# Patient Record
Sex: Male | Born: 1940 | Race: White | Hispanic: No | Marital: Married | Smoking: Former smoker
Health system: Southern US, Community
[De-identification: ages and names within clinical notes are randomized; demographics above are authoritative.]

## PROBLEM LIST (undated history)

## (undated) DIAGNOSIS — I6529 Occlusion and stenosis of unspecified carotid artery: Secondary | ICD-10-CM

## (undated) DIAGNOSIS — I4891 Unspecified atrial fibrillation: Secondary | ICD-10-CM

## (undated) DIAGNOSIS — I5042 Chronic combined systolic (congestive) and diastolic (congestive) heart failure: Secondary | ICD-10-CM

## (undated) DIAGNOSIS — K56609 Unspecified intestinal obstruction, unspecified as to partial versus complete obstruction: Secondary | ICD-10-CM

## (undated) DIAGNOSIS — E785 Hyperlipidemia, unspecified: Secondary | ICD-10-CM

## (undated) DIAGNOSIS — J189 Pneumonia, unspecified organism: Secondary | ICD-10-CM

## (undated) DIAGNOSIS — E039 Hypothyroidism, unspecified: Secondary | ICD-10-CM

## (undated) DIAGNOSIS — Z85118 Personal history of other malignant neoplasm of bronchus and lung: Secondary | ICD-10-CM

## (undated) DIAGNOSIS — I2510 Atherosclerotic heart disease of native coronary artery without angina pectoris: Secondary | ICD-10-CM

## (undated) DIAGNOSIS — D497 Neoplasm of unspecified behavior of endocrine glands and other parts of nervous system: Secondary | ICD-10-CM

## (undated) DIAGNOSIS — I9789 Other postprocedural complications and disorders of the circulatory system, not elsewhere classified: Secondary | ICD-10-CM

## (undated) DIAGNOSIS — I639 Cerebral infarction, unspecified: Secondary | ICD-10-CM

## (undated) DIAGNOSIS — I714 Abdominal aortic aneurysm, without rupture: Secondary | ICD-10-CM

## (undated) DIAGNOSIS — Z8501 Personal history of malignant neoplasm of esophagus: Secondary | ICD-10-CM

## (undated) DIAGNOSIS — I671 Cerebral aneurysm, nonruptured: Secondary | ICD-10-CM

## (undated) DIAGNOSIS — C801 Malignant (primary) neoplasm, unspecified: Secondary | ICD-10-CM

## (undated) HISTORY — DX: Atherosclerotic heart disease of native coronary artery without angina pectoris: I25.10

## (undated) HISTORY — PX: OTHER SURGICAL HISTORY: SHX169

## (undated) HISTORY — DX: Unspecified atrial fibrillation: I48.91

## (undated) HISTORY — DX: Hypothyroidism, unspecified: E03.9

## (undated) HISTORY — DX: Hyperlipidemia, unspecified: E78.5

## (undated) HISTORY — DX: Neoplasm of unspecified behavior of endocrine glands and other parts of nervous system: D49.7

## (undated) HISTORY — PX: CATARACT EXTRACTION W/ INTRAOCULAR LENS  IMPLANT, BILATERAL: SHX1307

## (undated) HISTORY — DX: Personal history of other malignant neoplasm of bronchus and lung: Z85.118

## (undated) HISTORY — DX: Occlusion and stenosis of unspecified carotid artery: I65.29

## (undated) HISTORY — DX: Other postprocedural complications and disorders of the circulatory system, not elsewhere classified: I97.89

## (undated) HISTORY — DX: Personal history of malignant neoplasm of esophagus: Z85.01

## (undated) HISTORY — PX: CHOLECYSTECTOMY: SHX55

## (undated) HISTORY — DX: Abdominal aortic aneurysm, without rupture: I71.4

## (undated) HISTORY — PX: BRAIN SURGERY: SHX531

## (undated) HISTORY — DX: Unspecified intestinal obstruction, unspecified as to partial versus complete obstruction: K56.609

## (undated) HISTORY — PX: TONSILLECTOMY: SUR1361

## (undated) HISTORY — DX: Cerebral aneurysm, nonruptured: I67.1

---

## 1993-12-10 DIAGNOSIS — I639 Cerebral infarction, unspecified: Secondary | ICD-10-CM

## 1993-12-10 HISTORY — DX: Cerebral infarction, unspecified: I63.9

## 2000-07-15 ENCOUNTER — Emergency Department (HOSPITAL_COMMUNITY): Admission: EM | Admit: 2000-07-15 | Discharge: 2000-07-15 | Payer: Self-pay | Admitting: Emergency Medicine

## 2000-07-15 ENCOUNTER — Encounter: Payer: Self-pay | Admitting: Emergency Medicine

## 2002-10-09 ENCOUNTER — Encounter: Payer: Self-pay | Admitting: Internal Medicine

## 2002-10-09 ENCOUNTER — Encounter: Admission: RE | Admit: 2002-10-09 | Discharge: 2002-10-09 | Payer: Self-pay | Admitting: Internal Medicine

## 2002-10-21 ENCOUNTER — Encounter: Admission: RE | Admit: 2002-10-21 | Discharge: 2002-10-21 | Payer: Self-pay | Admitting: Gastroenterology

## 2002-10-21 ENCOUNTER — Encounter: Payer: Self-pay | Admitting: Gastroenterology

## 2002-10-29 ENCOUNTER — Ambulatory Visit: Admission: RE | Admit: 2002-10-29 | Discharge: 2003-01-08 | Payer: Self-pay | Admitting: Radiation Oncology

## 2002-11-02 ENCOUNTER — Encounter: Payer: Self-pay | Admitting: Thoracic Surgery

## 2002-11-02 ENCOUNTER — Encounter: Admission: RE | Admit: 2002-11-02 | Discharge: 2002-11-02 | Payer: Self-pay | Admitting: Thoracic Surgery

## 2002-11-13 ENCOUNTER — Ambulatory Visit (HOSPITAL_COMMUNITY): Admission: RE | Admit: 2002-11-13 | Discharge: 2002-11-13 | Payer: Self-pay | Admitting: Hematology & Oncology

## 2002-11-13 ENCOUNTER — Encounter: Payer: Self-pay | Admitting: Hematology & Oncology

## 2002-12-07 ENCOUNTER — Encounter: Payer: Self-pay | Admitting: Hematology & Oncology

## 2002-12-07 ENCOUNTER — Encounter (HOSPITAL_COMMUNITY): Admission: RE | Admit: 2002-12-07 | Discharge: 2002-12-07 | Payer: Self-pay | Admitting: Radiation Oncology

## 2002-12-07 ENCOUNTER — Inpatient Hospital Stay (HOSPITAL_COMMUNITY): Admission: AD | Admit: 2002-12-07 | Discharge: 2002-12-11 | Payer: Self-pay | Admitting: Hematology & Oncology

## 2002-12-21 ENCOUNTER — Encounter: Payer: Self-pay | Admitting: Hematology & Oncology

## 2002-12-21 ENCOUNTER — Ambulatory Visit (HOSPITAL_COMMUNITY): Admission: RE | Admit: 2002-12-21 | Discharge: 2002-12-21 | Payer: Self-pay | Admitting: Hematology & Oncology

## 2003-01-13 ENCOUNTER — Encounter (HOSPITAL_COMMUNITY): Admission: RE | Admit: 2003-01-13 | Discharge: 2003-02-08 | Payer: Self-pay | Admitting: Hematology & Oncology

## 2003-01-22 ENCOUNTER — Encounter: Admission: RE | Admit: 2003-01-22 | Discharge: 2003-01-22 | Payer: Self-pay | Admitting: Thoracic Surgery

## 2003-01-22 ENCOUNTER — Encounter: Payer: Self-pay | Admitting: Thoracic Surgery

## 2003-02-10 ENCOUNTER — Inpatient Hospital Stay (HOSPITAL_COMMUNITY): Admission: RE | Admit: 2003-02-10 | Discharge: 2003-02-26 | Payer: Self-pay | Admitting: Thoracic Surgery

## 2003-02-10 ENCOUNTER — Encounter (INDEPENDENT_AMBULATORY_CARE_PROVIDER_SITE_OTHER): Payer: Self-pay | Admitting: *Deleted

## 2003-02-10 ENCOUNTER — Encounter: Payer: Self-pay | Admitting: Thoracic Surgery

## 2003-02-11 ENCOUNTER — Encounter: Payer: Self-pay | Admitting: Thoracic Surgery

## 2003-02-11 HISTORY — PX: OTHER SURGICAL HISTORY: SHX169

## 2003-02-12 ENCOUNTER — Encounter: Payer: Self-pay | Admitting: Thoracic Surgery

## 2003-02-13 ENCOUNTER — Encounter: Payer: Self-pay | Admitting: Thoracic Surgery

## 2003-02-14 ENCOUNTER — Encounter: Payer: Self-pay | Admitting: Thoracic Surgery

## 2003-02-15 ENCOUNTER — Encounter: Payer: Self-pay | Admitting: Thoracic Surgery

## 2003-02-16 ENCOUNTER — Encounter: Payer: Self-pay | Admitting: Thoracic Surgery

## 2003-02-17 ENCOUNTER — Encounter: Payer: Self-pay | Admitting: Thoracic Surgery

## 2003-02-18 ENCOUNTER — Encounter: Payer: Self-pay | Admitting: Thoracic Surgery

## 2003-02-19 ENCOUNTER — Encounter: Payer: Self-pay | Admitting: Thoracic Surgery

## 2003-02-20 ENCOUNTER — Encounter: Payer: Self-pay | Admitting: Thoracic Surgery

## 2003-02-21 ENCOUNTER — Encounter: Payer: Self-pay | Admitting: Thoracic Surgery

## 2003-02-22 ENCOUNTER — Encounter: Payer: Self-pay | Admitting: Thoracic Surgery

## 2003-02-23 ENCOUNTER — Encounter: Payer: Self-pay | Admitting: Thoracic Surgery

## 2003-02-26 ENCOUNTER — Encounter: Payer: Self-pay | Admitting: Thoracic Surgery

## 2003-03-04 ENCOUNTER — Encounter: Admission: RE | Admit: 2003-03-04 | Discharge: 2003-03-04 | Payer: Self-pay | Admitting: Thoracic Surgery

## 2003-03-04 ENCOUNTER — Encounter: Payer: Self-pay | Admitting: Thoracic Surgery

## 2003-04-02 ENCOUNTER — Encounter: Admission: RE | Admit: 2003-04-02 | Discharge: 2003-04-02 | Payer: Self-pay | Admitting: Thoracic Surgery

## 2003-04-02 ENCOUNTER — Encounter: Payer: Self-pay | Admitting: Thoracic Surgery

## 2003-04-28 ENCOUNTER — Encounter: Admission: RE | Admit: 2003-04-28 | Discharge: 2003-04-28 | Payer: Self-pay | Admitting: Hematology & Oncology

## 2003-04-28 ENCOUNTER — Encounter: Payer: Self-pay | Admitting: Hematology & Oncology

## 2003-05-07 ENCOUNTER — Encounter: Payer: Self-pay | Admitting: Thoracic Surgery

## 2003-05-07 ENCOUNTER — Encounter: Admission: RE | Admit: 2003-05-07 | Discharge: 2003-05-07 | Payer: Self-pay | Admitting: Thoracic Surgery

## 2003-05-28 ENCOUNTER — Encounter: Admission: RE | Admit: 2003-05-28 | Discharge: 2003-05-28 | Payer: Self-pay | Admitting: Thoracic Surgery

## 2003-05-28 ENCOUNTER — Encounter: Payer: Self-pay | Admitting: Thoracic Surgery

## 2003-06-11 ENCOUNTER — Encounter: Payer: Self-pay | Admitting: Thoracic Surgery

## 2003-06-11 ENCOUNTER — Encounter: Admission: RE | Admit: 2003-06-11 | Discharge: 2003-06-11 | Payer: Self-pay | Admitting: Thoracic Surgery

## 2003-07-13 ENCOUNTER — Encounter: Admission: RE | Admit: 2003-07-13 | Discharge: 2003-07-13 | Payer: Self-pay | Admitting: Hematology & Oncology

## 2003-07-13 ENCOUNTER — Encounter: Payer: Self-pay | Admitting: Hematology & Oncology

## 2003-08-11 ENCOUNTER — Encounter: Admission: RE | Admit: 2003-08-11 | Discharge: 2003-08-11 | Payer: Self-pay | Admitting: Thoracic Surgery

## 2003-08-11 ENCOUNTER — Encounter: Payer: Self-pay | Admitting: Thoracic Surgery

## 2003-09-22 ENCOUNTER — Encounter: Admission: RE | Admit: 2003-09-22 | Discharge: 2003-09-22 | Payer: Self-pay | Admitting: Hematology & Oncology

## 2003-09-22 ENCOUNTER — Encounter: Payer: Self-pay | Admitting: Hematology & Oncology

## 2003-11-10 ENCOUNTER — Encounter: Admission: RE | Admit: 2003-11-10 | Discharge: 2003-11-10 | Payer: Self-pay | Admitting: Thoracic Surgery

## 2003-11-25 ENCOUNTER — Ambulatory Visit (HOSPITAL_COMMUNITY): Admission: RE | Admit: 2003-11-25 | Discharge: 2003-11-25 | Payer: Self-pay | Admitting: Gastroenterology

## 2003-12-07 ENCOUNTER — Encounter: Admission: RE | Admit: 2003-12-07 | Discharge: 2003-12-07 | Payer: Self-pay | Admitting: Hematology & Oncology

## 2003-12-10 IMAGING — CT CT CHEST W/ CM
1 of 2 series · 14 of 32 positions shown, 18 images · IV contrast (GASTRO. & OMNIPAQUE [ID])
Comparison: none

FINDINGS
CLINICAL DATA: TOITS. FOLLOW UP RECURRENT ESOPHAGEAL CANCER.  STATUS POST SURGERY, RADIATION AND
CHEMO.   (CON: NONE)
COMPARISON TO 07/13/03.
MULTIDETECTOR HELICAL CT IMAGING IS PERFORMED THROUGH THE CHEST, ABDOMEN AND PELVIS FOLLOWING
DILUTE ORAL CONTRAST AND 100 CC OMNIPAQUE 300 IV.
CT OF CHEST WITH CONTRAST:
POSTOPERATIVE CHANGES FROM GASTRIC PULL-THROUGH SURGERY ARE AGAIN NOTED.  THE GASTRIC PULL-THROUGH
AND REMAINING ESOPHAGUS ARE AGAIN NOTED TO BE MILDLY DILATED.   NO REAL CHANGE SINCE THE PRIOR
STUDY.  OBSCURATIONS OF THE FAT PLANE AROUND THE POSTOPERATIVE SITE ARE AGAIN NOTED AND ARE
UNCHANGED, LIKELY POSTOPERATIVE SCARRING.
NO EVIDENCE OF MEDIASTINAL, AXILLARY OR HILAR ADENOPATHY.  SMALL TO MEDIUM SIZE LEFT PLEURAL
EFFUSION IS NOTED, UNCHANGED.
ON TODAY'S STUDY, THE CONTRAST WAS INJECTED WITHIN THE RIGHT ARM.  THIS SHOWS WHAT APPEARS TO BE
CHRONIC OCCLUSION OF THE RIGHT SUBCLAVIAN VEIN WITH NUMEROUS COLLATERAL VESSELS IN THE RIGHT CHEST,
AND THROUGH AN AZYGOS VENOUS SYSTEM.  THE PRIOR STUDY WAS INJECTED FROM THE LEFT ARM WHICH SHOWS A
PATENT LEFT SYSTEM AND SVC.
IMPRESSION
1.  STABLE POSTOPERATIVE APPEARANCE FOR GASTRIC PULL-THROUGH SURGERY.
2.  STABLE SMALL TO MEDIUM SIZE LEFT PLEURAL EFFUSION.
3.  PROBABLE CHRONIC OCCLUSION OF RIGHT SUBCLAVIAN VEIN WITH NUMEROUS COLLATERALS IN THE RIGHT
CHEST.
CT OF ABDOMEN WITH CONTRAST:
THE PATIENT IS STATUS POST CHOLECYSTECTOMY.  NO FOCAL LESIONS SEEN IN THE LIVER, SPLEEN, PANCREAS,
LEFT ADRENALS OR KIDNEYS EXCEPT FOR SMALL RENAL CYSTS.  THE RIGHT ADRENAL GLAND APPEARS MORE
PROMINENT ON TODAY'S STUDY WITH SOME NODULARITY WITHIN THE INFERIOR ASPECT OF THE RIGHT ADRENAL
GLAND.  A NODULE WITHIN THE RIGHT INFERIOR ADRENAL GLAND MEASURES 15 MM.  IT IS UNKNOWN IF THIS IS
TRULY A CHANGE SINCE PRIOR STUDY OR IF THIS IS SIMPLY BETTER DEFINED AND SEEN ON TODAY'S STUDY.
WHEN COMPARING TO THE PRIOR STUDY, I FAVOR THAT THIS DOES REPRESENT A CHANGE, AND CLOSE INTERVAL
FOLLOW UP OF THIS AREA IS RECOMMENDED.
AGAIN NOTED IS A SMALL ABDOMINAL AORTIC ANEURYSM MEASURING MAXIMALLY 3 CM, STABLE SINCE PRIOR
STUDY.  EXTENSIVE MURAL PLAQUE IS NOTED AND CALCIFICATIONS ARE NOTED.  NO ADENOPATHY.
THE WALL OF THE MID TRANSVERSE COLON APPEARS THICKENED ON TODAY'S STUDY.  THIS MAY SIMPLY BE
RELATED TO PERISTALTIC WAVE OR NON-DISTENTION, BUT I WOULD RECOMMEND ATTENTION TO THIS AREA ON
FOLLOW UP STUDIES AS WELL OR BARIUM ENEMA FOR FURTHER EVALUATION.
1.  INCREASING PROMINENCE OF THE RIGHT ADRENAL GLAND WITH MILD NODULARITY NOTED.  I WOULD RECOMMEND
CLOSE INTERVAL FOLLOW UP OF THIS FINDING.
2.  NEW THICKENING IN THE MID TRANSVERSE COLON, WHICH COULD BE RELATED TO PERISTALTIC WAVE OR NON-
DISTENTION, BUT ATTENTION TO THIS AREA ON SUBSEQUENT FILMING OR BARIUM ENEMA IS RECOMMENDED FOR
FURTHER EVALUATION.
3.  STATUS POST CHOLECYSTECTOMY.
4.  STABLE ABDOMINAL AORTIC ANEURYSM, 3 CM.
CT OF PELVIS WITH CONTRAST:
NO FREE FLUID, FREE AIR OR ADENOPATHY.  THE BOWEL IS GROSSLY UNREMARKABLE.  BONY STRUCTURES ARE
NORMAL.
NO ACUTE ABNORMALITY IN THE PELVIS.

[Series 2: chest/abd/pelvis · axial · 0.66mm/px · z∈[-608,-9]mm · 14 of 165 slices shown, 18 images]
[im 8/165  soft-tissue]
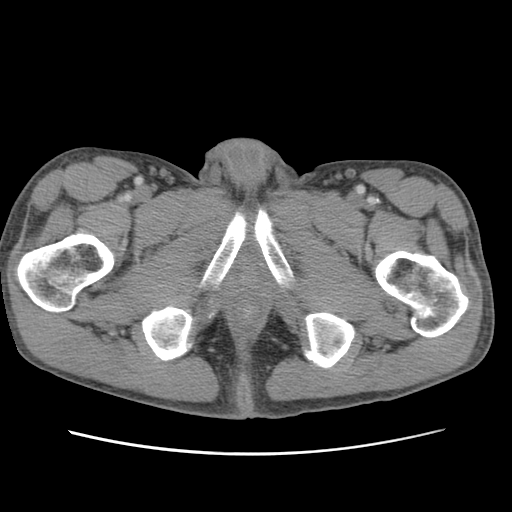
[im 8/165  bone]
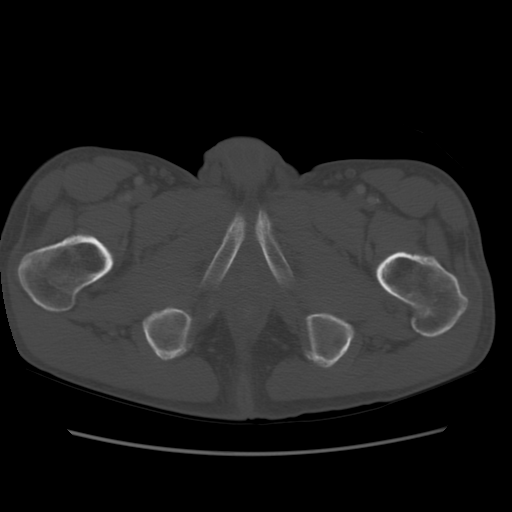
[im 23/165  soft-tissue]
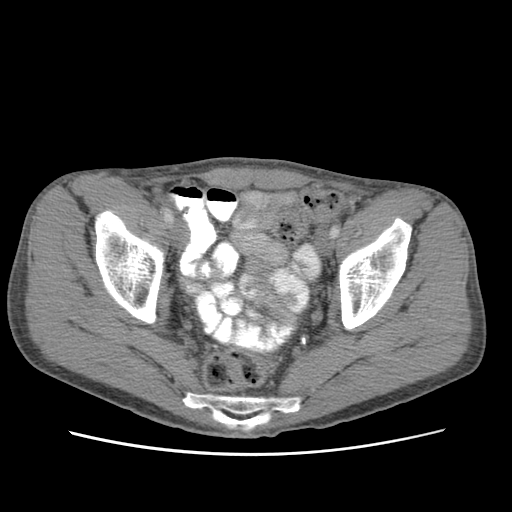
[im 38/165  soft-tissue]
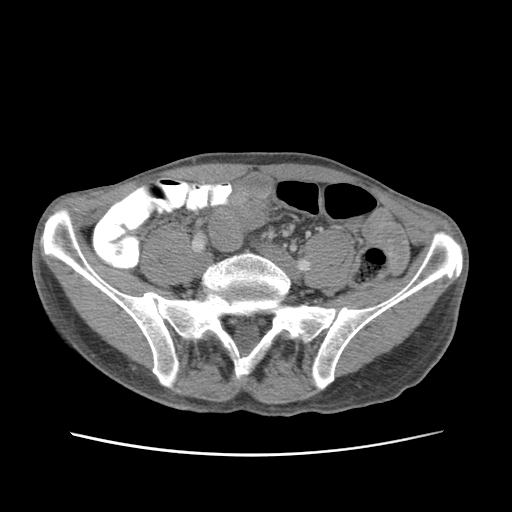
[im 53/165  soft-tissue]
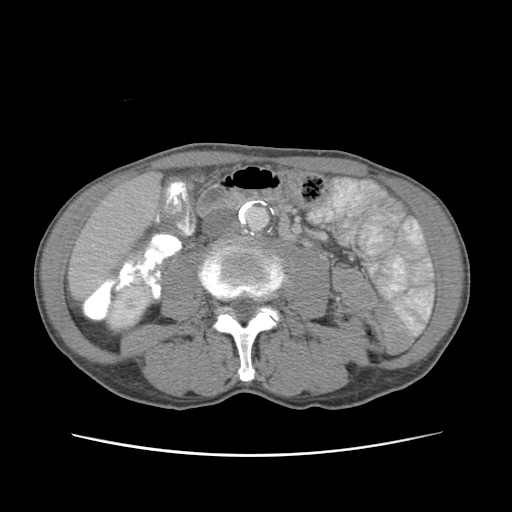
[im 60/165  soft-tissue]
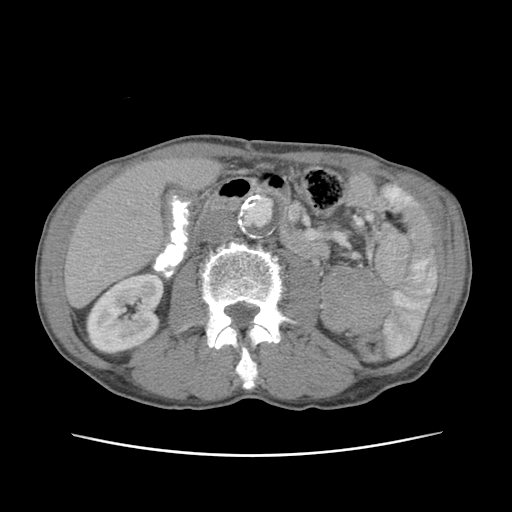
[im 75/165  soft-tissue]
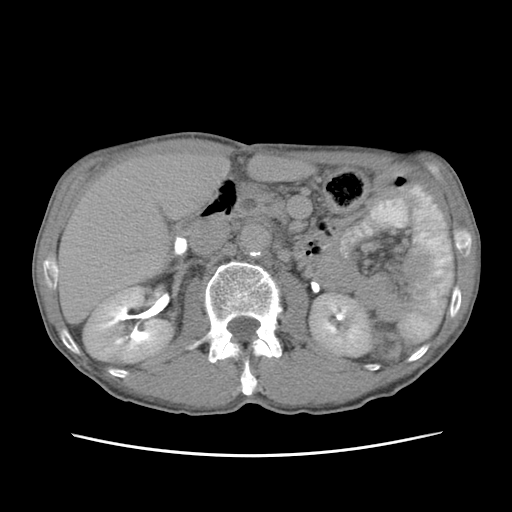
[im 90/165  soft-tissue]
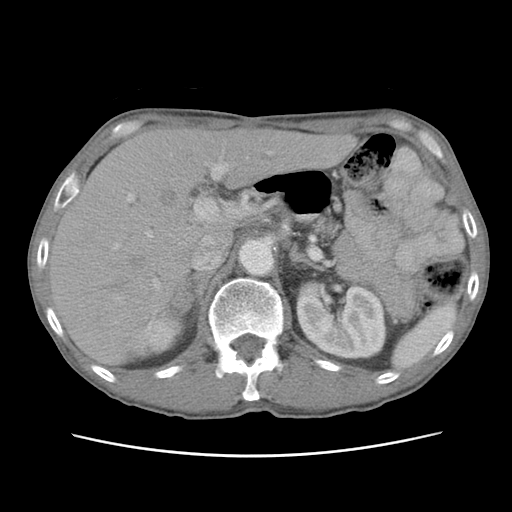
[im 105/165  soft-tissue]
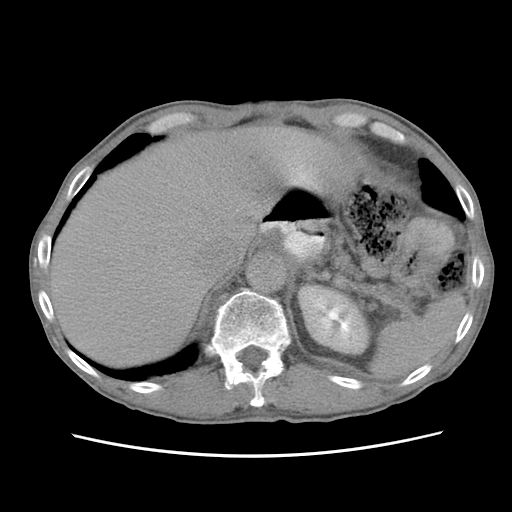
[im 112/165  soft-tissue]
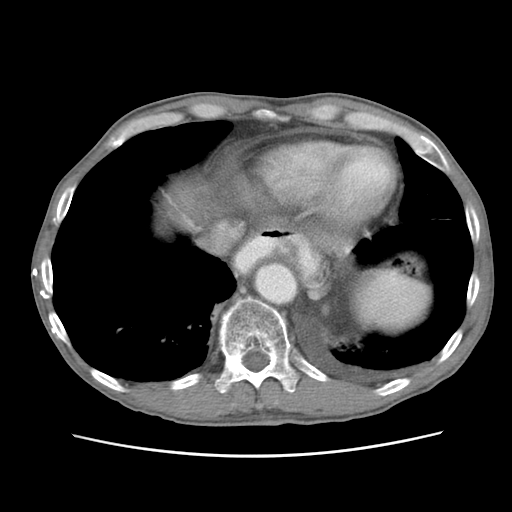
[im 112/165  bone]
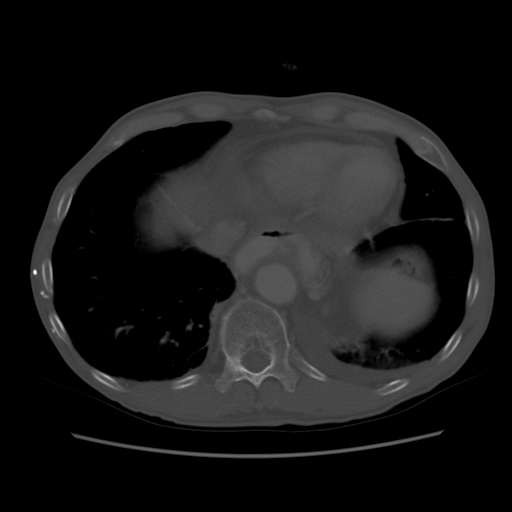
[im 127/165  soft-tissue]
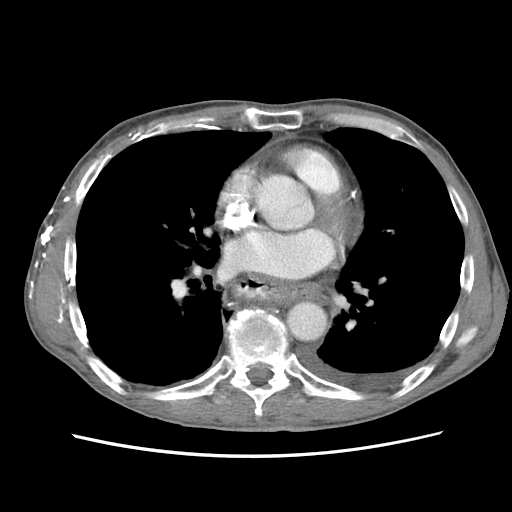
[im 135/165  lung]
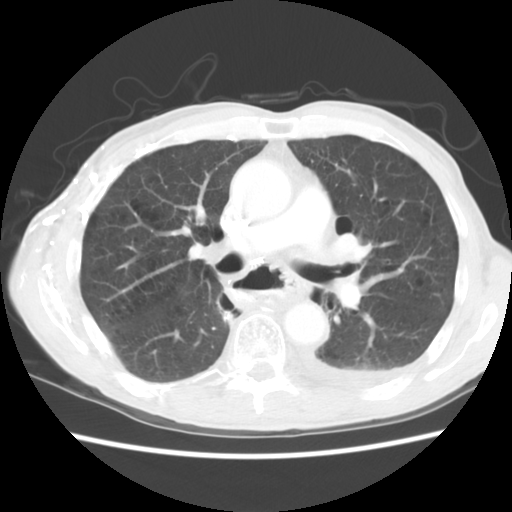
[im 142/165  soft-tissue]
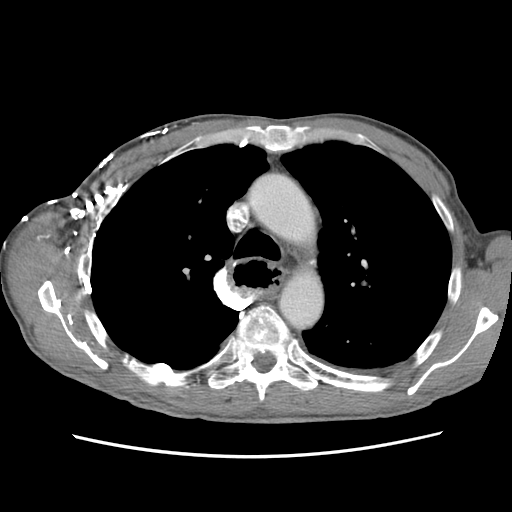
[im 142/165  lung]
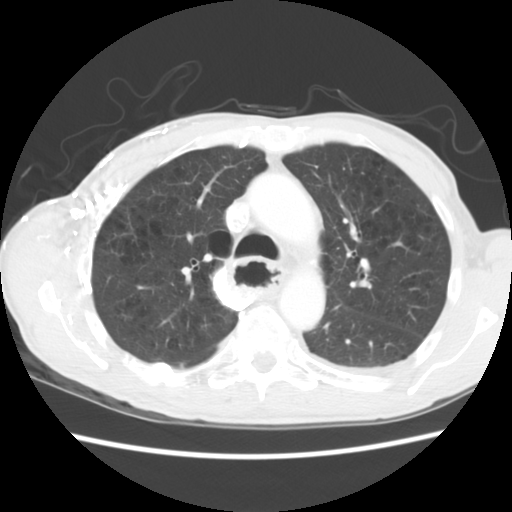
[im 150/165  lung]
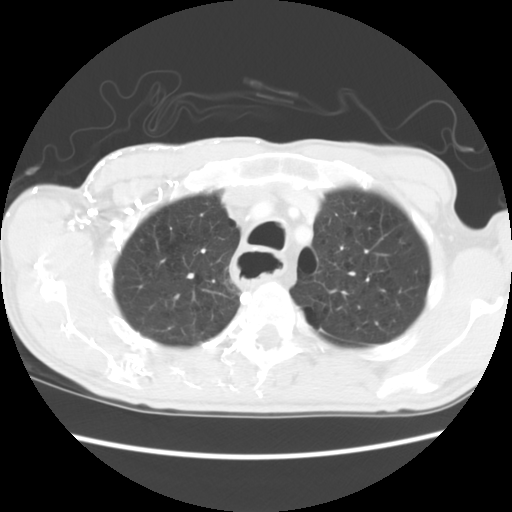
[im 157/165  soft-tissue]
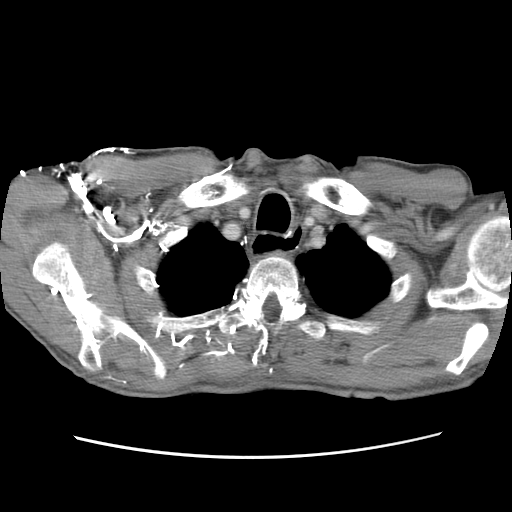
[im 157/165  lung]
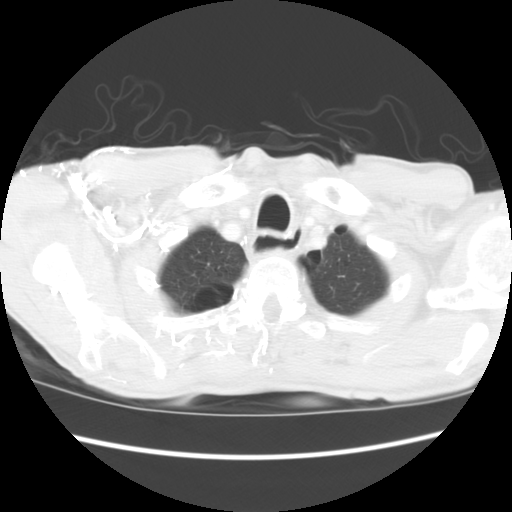

[14 of 32 positions shown; findings below may reference images not displayed]

## 2003-12-24 ENCOUNTER — Ambulatory Visit (HOSPITAL_COMMUNITY): Admission: RE | Admit: 2003-12-24 | Discharge: 2003-12-24 | Payer: Self-pay | Admitting: Hematology & Oncology

## 2004-01-21 ENCOUNTER — Encounter (INDEPENDENT_AMBULATORY_CARE_PROVIDER_SITE_OTHER): Payer: Self-pay | Admitting: *Deleted

## 2004-01-21 ENCOUNTER — Inpatient Hospital Stay (HOSPITAL_COMMUNITY): Admission: RE | Admit: 2004-01-21 | Discharge: 2004-01-25 | Payer: Self-pay | Admitting: Surgery

## 2004-01-26 ENCOUNTER — Inpatient Hospital Stay (HOSPITAL_COMMUNITY): Admission: EM | Admit: 2004-01-26 | Discharge: 2004-02-02 | Payer: Self-pay | Admitting: Emergency Medicine

## 2004-02-24 IMAGING — CT CT CHEST W/ CM
1 of 2 series · 14 of 32 positions shown, 18 images · IV contrast (gastro & omnipaque 100)
Comparison: none

CLINICAL DATA: Recurrent esophageal cancer.  He had surgery with a gastric pull-through in [REDACTED] and finished chemotherapy and radiation therapy in [REDACTED] of this year.  He is an ex-smoker.    CON ? none.
CHEST CT WITH CONTRAST 
Helical transaxial images of the chest, abdomen and pelvis were obtained with oral contrast and 100 cc of Omnipaque 300 intravenous contrast.  These are compared to the previous examinations dated 09/22/03.  Again demonstrated are changes of a gastric pull-through with no visible mass.  Extensive changes of COPD are again demonstrated throughout both lungs.  No lung nodules or enlarged lymph nodes are seen.  Again noted is occlusion of the right subclavian vein with associated collaterals.  An old, healed right sixth posterior rib fracture is unchanged.  Thoracic spine degenerative changes are also noted.  There has been an interval decrease in amount of left pleural fluid. 
IMPRESSION
1.  Status post esophagectomy and gastric pull-through without evidence of tumor recurrence for metastatic disease.
2.  Stable extensive changes of COPD.
3.  Stable occlusion of the right subclavian vein.  
4.  Minimal left pleural effusion with improvement.
ABDOMEN CT WITH CONTRAST 
There has been an interval increase in size of an inhomogeneously enhancing mass in the right adrenal gland.  This currently measures 4.4 x 2.8 cm in maximum dimensions.  Two small right renal cysts are unchanged. Cholecystectomy clips are again demonstrated.  The liver, spleen, pancreas, left kidney, and left adrenal gland are unremarkable.  No intestinal abnormalities or enlarged lymph nodes are seen.  There has been no significant change in minimal aneurysmal dilatation of the infrarenal abdominal aorta, measuring 3.1 x 2.8 cm in maximum dimensions.  Lumbar spine degenerative changes are noted. 
1.  Enlarging right adrenal metastasis, currently measuring 4.4 x 2.8 cm in maximum dimensions. 
2.  Stable small infrarenal abdominal aortic aneurysm, measuring 3.1 cm in maximum diameter.  
3.  Stable two small right renal cysts.  
4.  Status post cholecystectomy.
PELVIS CT WITH CONTRAST
The pelvic loops of colon and small bowel are unremarkable as are the urinary bladder and prostate gland.  No masses or enlarged lymph nodes are seen.  Unremarkable bones. 
No acute pelvic abnormality.

[Series 2: chest/abd/pelvis · axial · 0.70mm/px · z∈[-587,-20]mm · 14 of 154 slices shown, 18 images]
[im 7/154  soft-tissue]
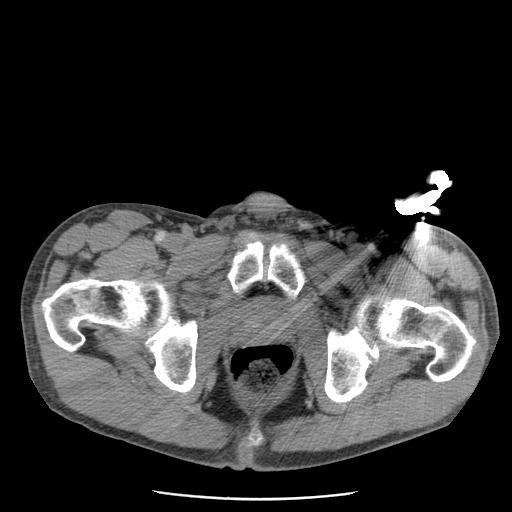
[im 7/154  bone]
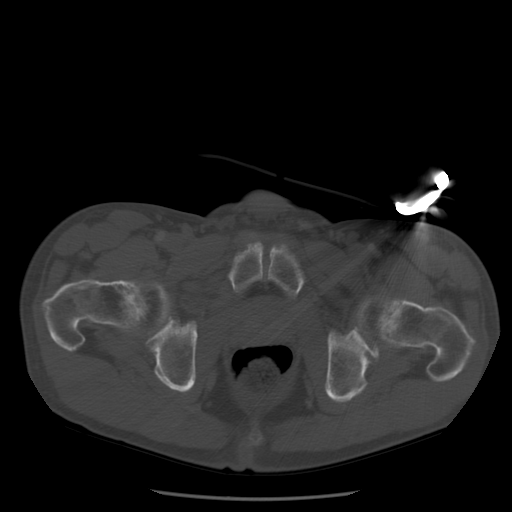
[im 21/154  soft-tissue]
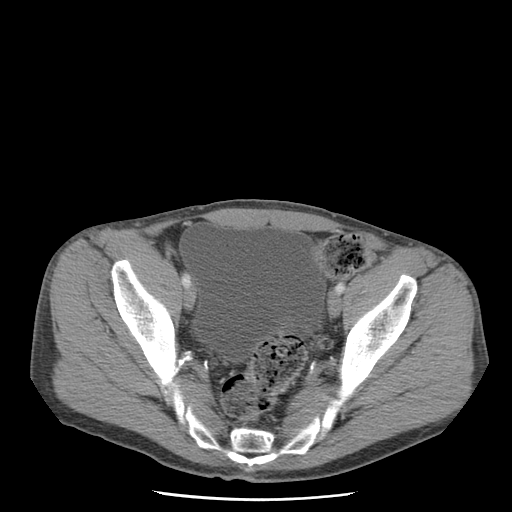
[im 35/154  soft-tissue]
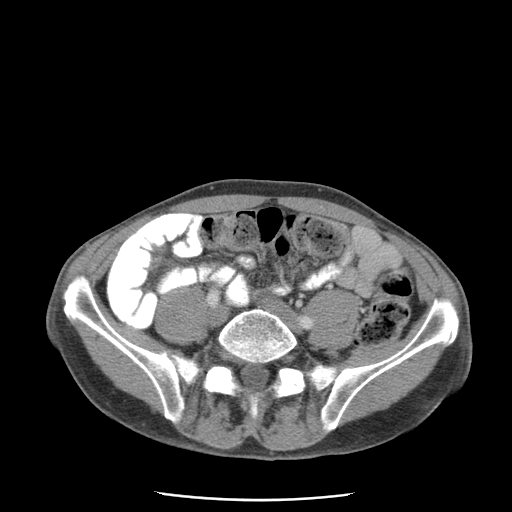
[im 49/154  soft-tissue]
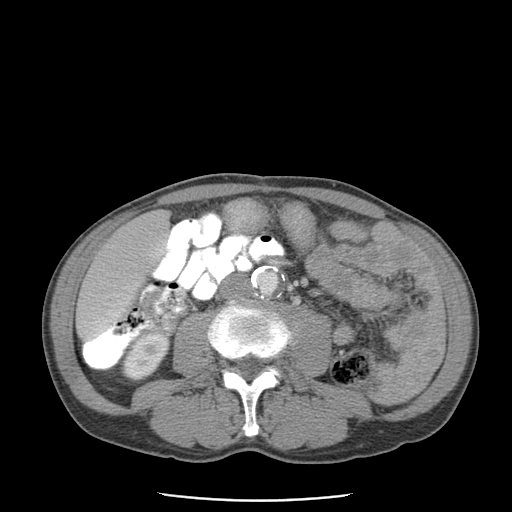
[im 56/154  soft-tissue]
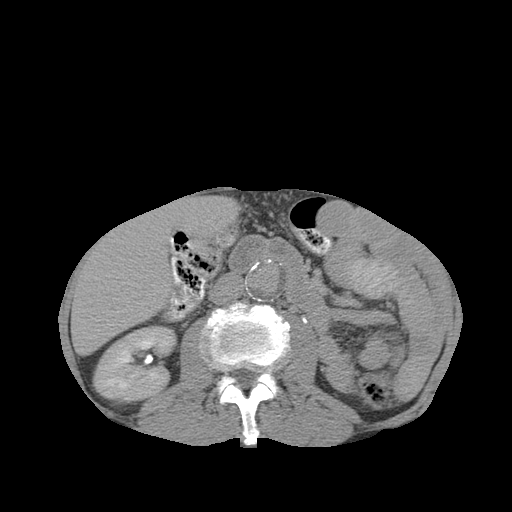
[im 70/154  soft-tissue]
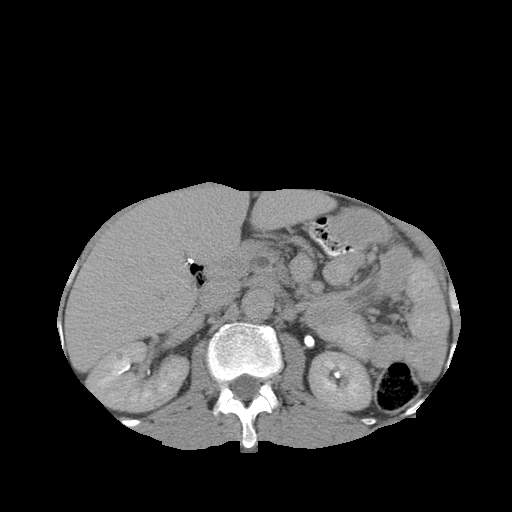
[im 84/154  soft-tissue]
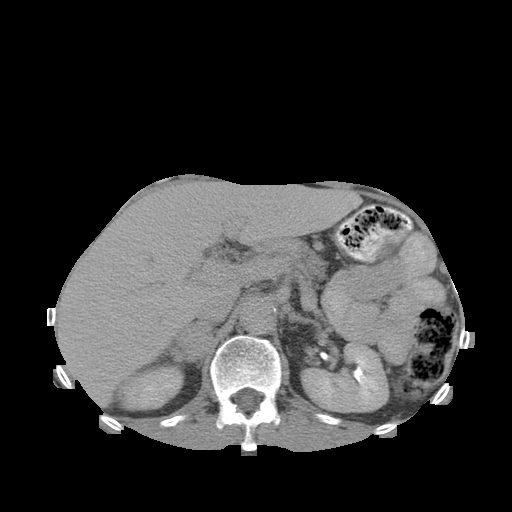
[im 98/154  soft-tissue]
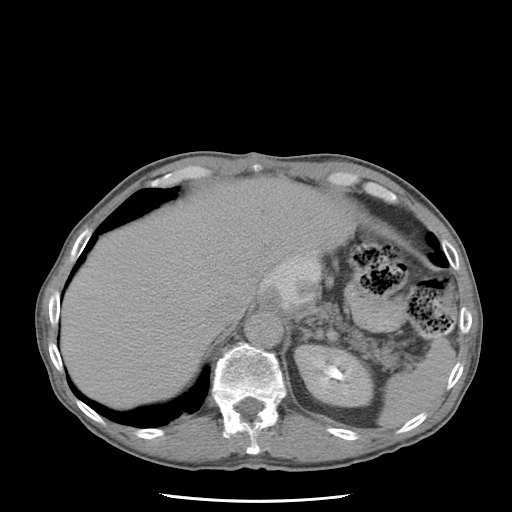
[im 105/154  soft-tissue]
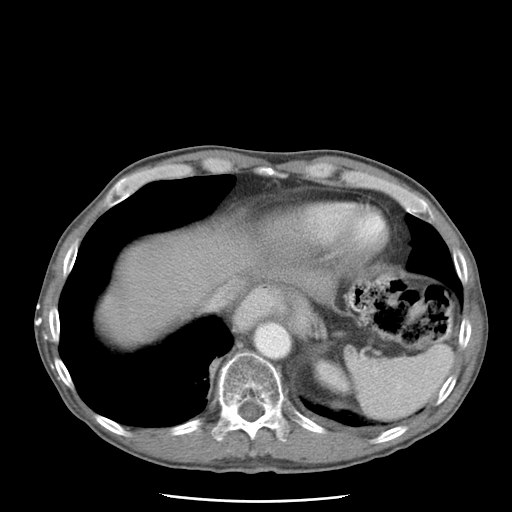
[im 105/154  bone]
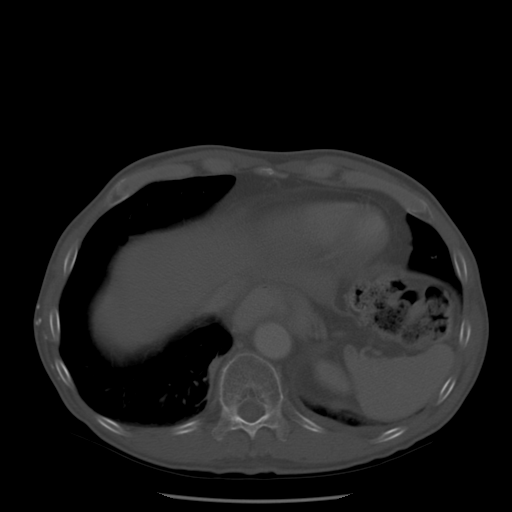
[im 119/154  soft-tissue]
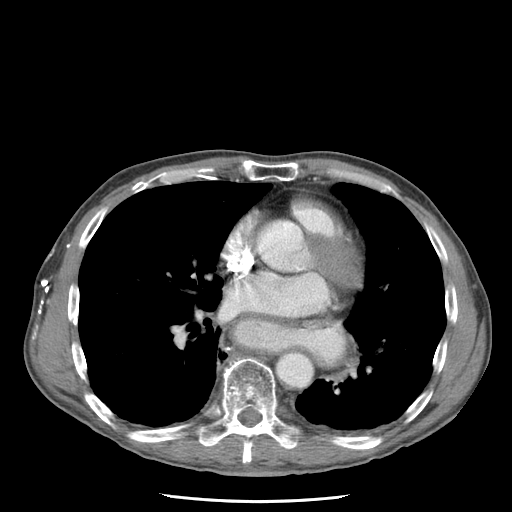
[im 126/154  lung]
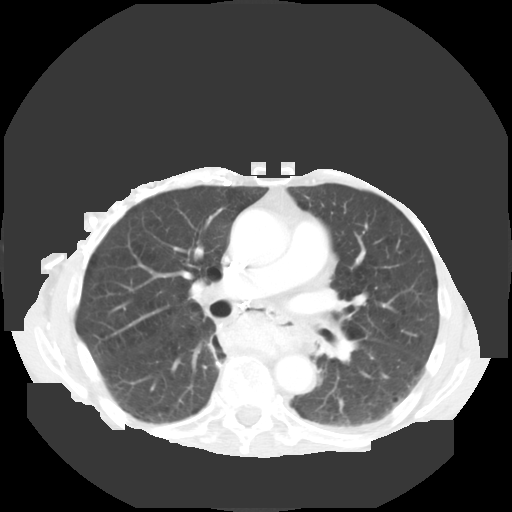
[im 133/154  soft-tissue]
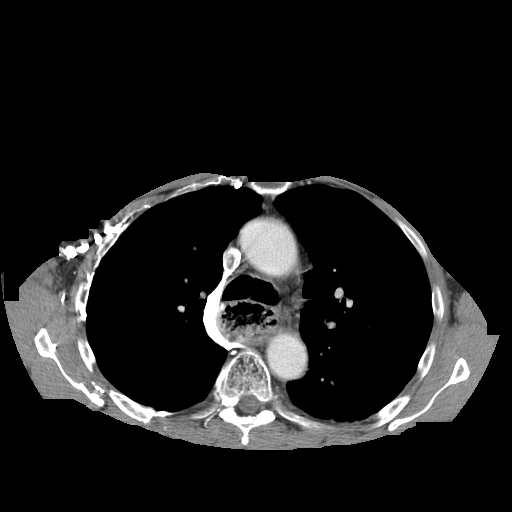
[im 133/154  lung]
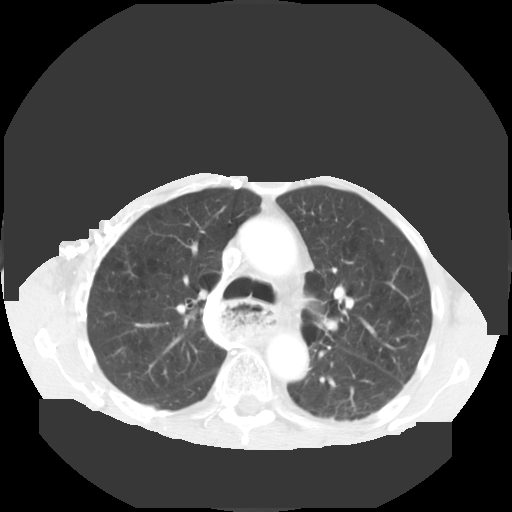
[im 140/154  lung]
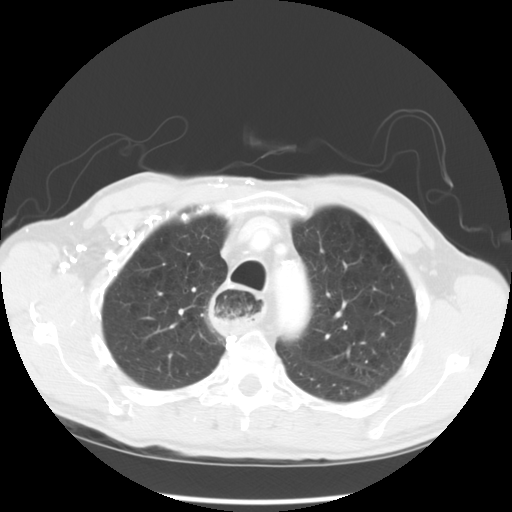
[im 147/154  soft-tissue]
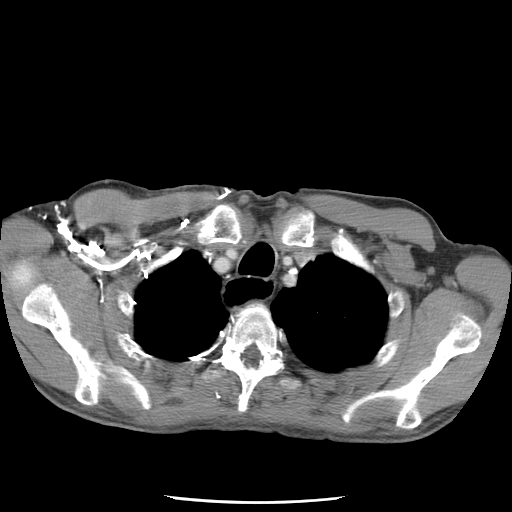
[im 147/154  lung]
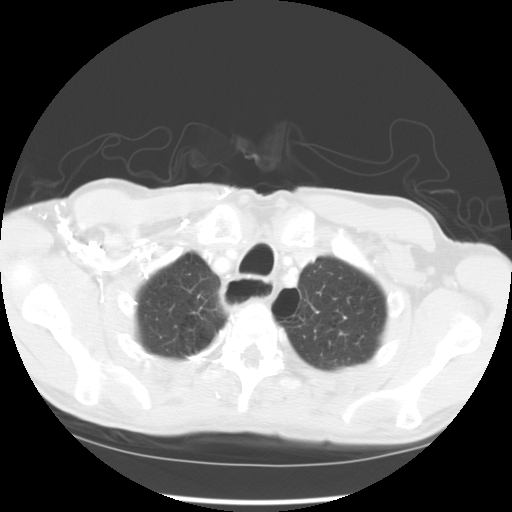

[14 of 32 positions shown; findings below may reference images not displayed]

## 2004-03-09 ENCOUNTER — Ambulatory Visit (HOSPITAL_COMMUNITY): Admission: RE | Admit: 2004-03-09 | Discharge: 2004-03-09 | Payer: Self-pay | Admitting: Surgery

## 2004-03-09 ENCOUNTER — Ambulatory Visit (HOSPITAL_BASED_OUTPATIENT_CLINIC_OR_DEPARTMENT_OTHER): Admission: RE | Admit: 2004-03-09 | Discharge: 2004-03-09 | Payer: Self-pay | Admitting: Surgery

## 2004-03-09 HISTORY — PX: OTHER SURGICAL HISTORY: SHX169

## 2004-03-12 IMAGING — CT NM MULTISYS EXAM
3 series · 25 of 25 positions shown · IV contrast ([ID])
Comparison: none

CLINICAL DATA: Esophageal cancer.  Restaging.
FDG PET-CT TUMOR IMAGING (SKULL BASE TO THIGHS)
TECHNIQUE: 15.5 mCi F-18 FDG were administered via right antecubital vein.  Full-ring PET imaging was performed from the skull base through the mid-thighs 60 minutes after injection.  CT data was obtained and used for attenuation correction and anatomic localization only.  (This was not acquired as a diagnostic CT examination.)

[Series 1: pet ac · axial · 3.3mm · 4.69mm/px · z∈[-1011,+3]mm · 9 of 311 slices shown]
[im 1/311]
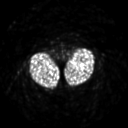
[im 39/311]
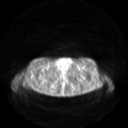
[im 78/311]
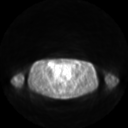
[im 117/311]
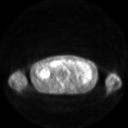
[im 156/311]
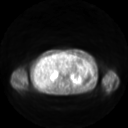
[im 194/311]
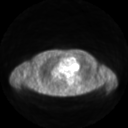
[im 233/311]
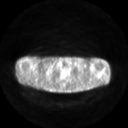
[im 272/311]
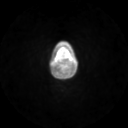
[im 311/311]
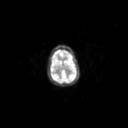

[Series 2: ct images · axial · 3.8mm · 0.98mm/px · z∈[-1011,+3]mm · 8 of 311 slices shown]
[im 1/311  soft-tissue]
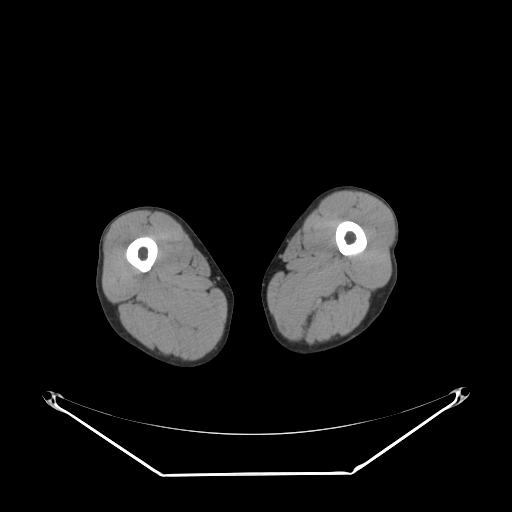
[im 45/311  soft-tissue]
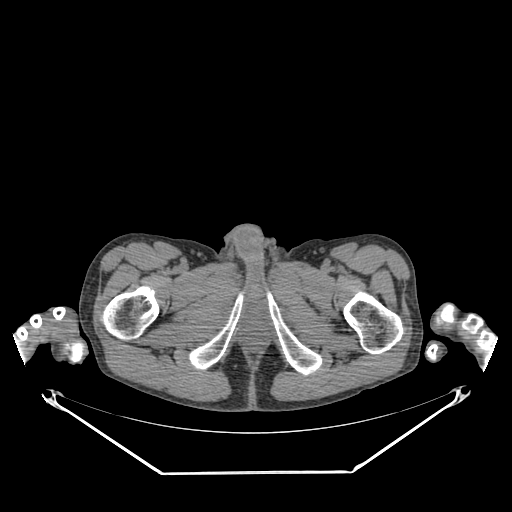
[im 89/311  soft-tissue]
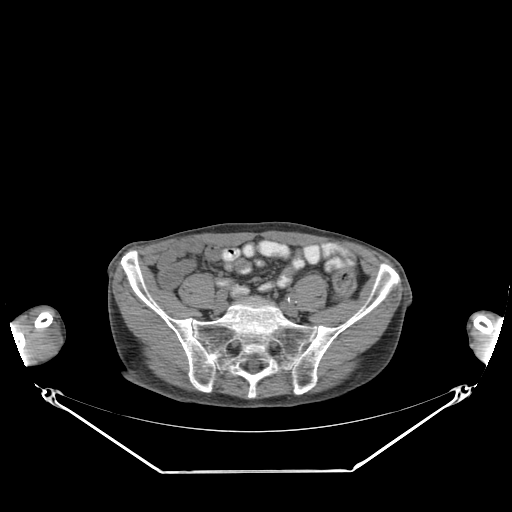
[im 133/311  soft-tissue]
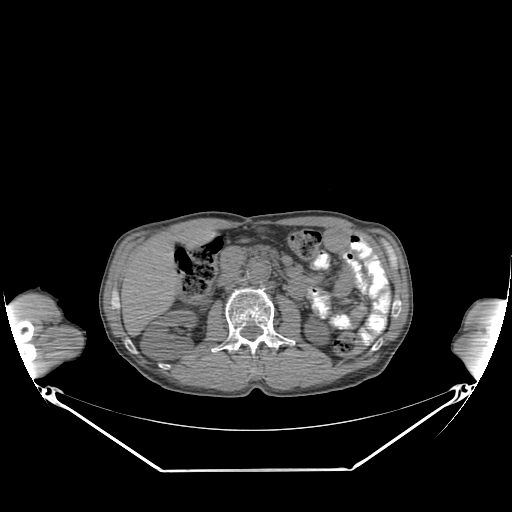
[im 178/311  soft-tissue]
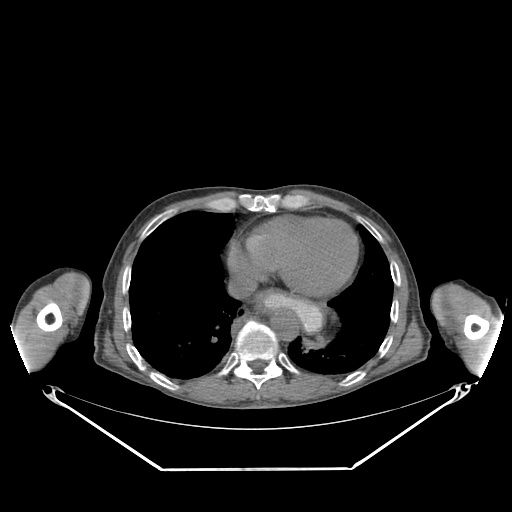
[im 222/311  soft-tissue]
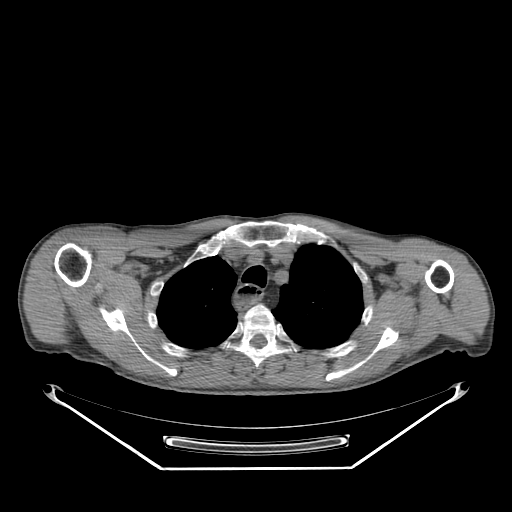
[im 266/311  soft-tissue]
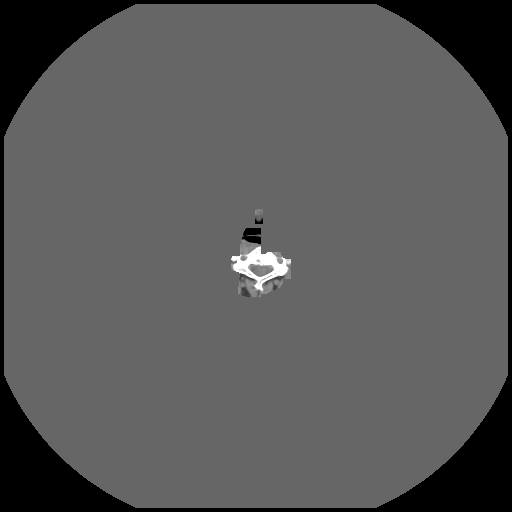
[im 311/311  soft-tissue]
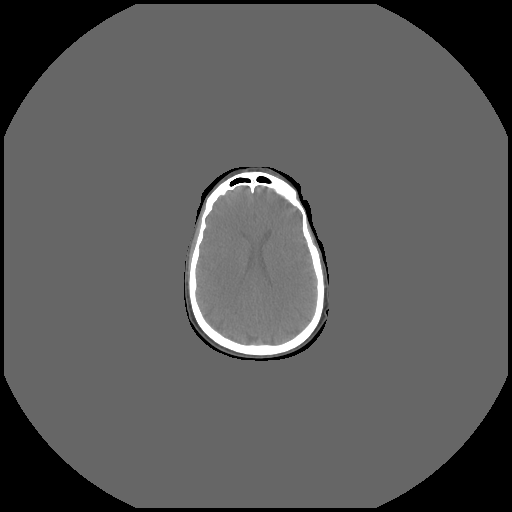

[Series 2: pet nac · axial · 3.3mm · 4.69mm/px · z∈[-1011,+3]mm · 8 of 311 slices shown]
[im 1/311]
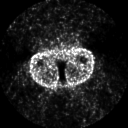
[im 45/311]
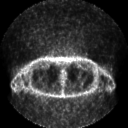
[im 89/311]
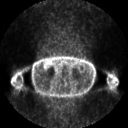
[im 133/311]
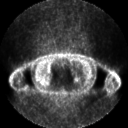
[im 178/311]
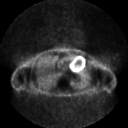
[im 222/311]
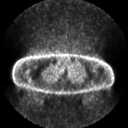
[im 266/311]
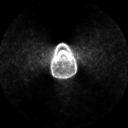
[im 311/311]
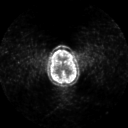

[25 of 25 positions shown; findings below may reference images not displayed]

FINDINGS: The patient has a history of esophageal cancer, having been treated with surgery, chemotherapy, and radiation therapy.  
There is a large adrenal mass measuring 2.8 x 4.6 cm with maximal SUV of 11.2.  This is highly suspicious for malignancy.  
Subtle radiotracer uptake along the spinous processes of lower lumbar spine with maximal SUV of 2.9 (L2, L3, and L4).  No associated bony destructive lesion.  Significance indeterminate.  It is possible this is related to motion at the time of injection rather than metastatic disease, although the latter cannot be absolutely excluded.  There are no other areas of abnormal radiotracer uptake in this patient who has had an esophagectomy and gastric pull-up procedure. 
Aneurysmal dilatation of ascending thoracic aorta measuring 4.3 x 4 cm in maximal transverse dimension.  Abdominal aortic aneurysmal dilatation measuring up to 3.1 x 2.9 cm in maximal transverse dimension.
IMPRESSION
1.  Enlarged adrenal gland with significant FDG uptake suspicious for metastatic disease.
2.  Subtle increased uptake along the spinous process tips of L2, L3, and L4, nonspecific with regard to etiology as discussed above.

## 2004-04-10 ENCOUNTER — Inpatient Hospital Stay (HOSPITAL_COMMUNITY): Admission: EM | Admit: 2004-04-10 | Discharge: 2004-04-11 | Payer: Self-pay | Admitting: Emergency Medicine

## 2004-04-14 ENCOUNTER — Ambulatory Visit (HOSPITAL_COMMUNITY): Admission: RE | Admit: 2004-04-14 | Discharge: 2004-04-14 | Payer: Self-pay | Admitting: Hematology & Oncology

## 2004-04-14 IMAGING — CR DG CHEST 1V PORT
1 series · 1 of 1 positions shown · non-contrast
Comparison: none

CLINICAL DATA: History of removal of right adrenal gland.  History of esophagectomy for esophageal CA and gastric pull through procedure.  Diaphoresis.  Pain at incision site. 
 PORTABLE CHEST, ONE VIEW
 Atelectatic changes and chronic interstitial changes at the lung bases.  No evidence of CHF.  COPD.  
 IMPRESSION
 Atelectatic changes in the lung bases.  See comments above.

[view not recorded]
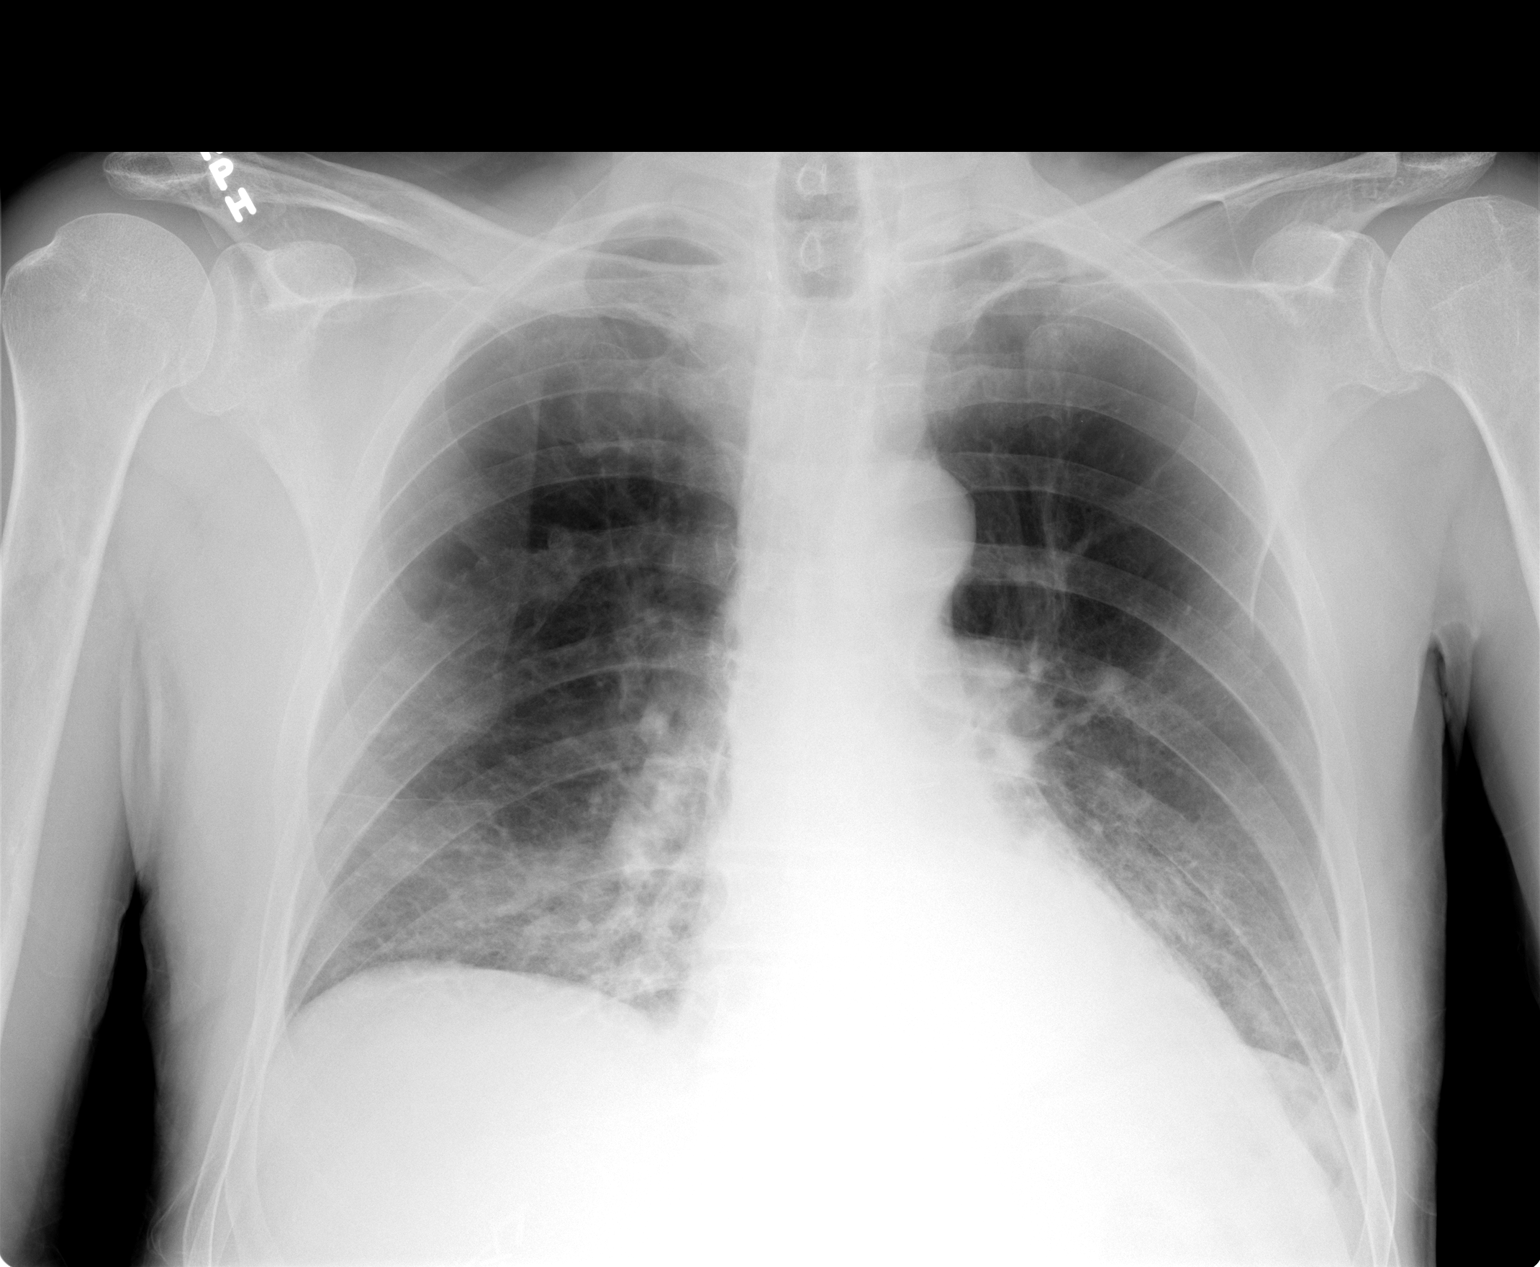

[1 of 1 positions shown; findings below may reference images not displayed]

## 2004-04-15 IMAGING — CR DG CHEST 1V PORT
1 series · 1 of 1 positions shown · non-contrast
Comparison: none

CLINICAL DATA: Status post central line placement.  
 PORTABLE CHEST (ONE VIEW) 01/27/04 AT 4688 HOURS
 Portable film at 4688 hours reveals a central line from a left subclavian approach that goes upward into the left internal jugular.  The tip of the catheter is not actually seen so repeat film is necessary after reposition.  Basilar subsegmental atelectasis is noted and is essentially unchanged since the previous film.  There is no pneumothorax.  Surgical clips are seen below the diaphragm on the right.  There appears to be some type of catheter in the right upper quadrant, this may be outside the patient. 
 IMPRESSION
 Left central venous line tip internal jugular vein on the left with no pneumothorax. 
 Little change in aeration.

[view not recorded]
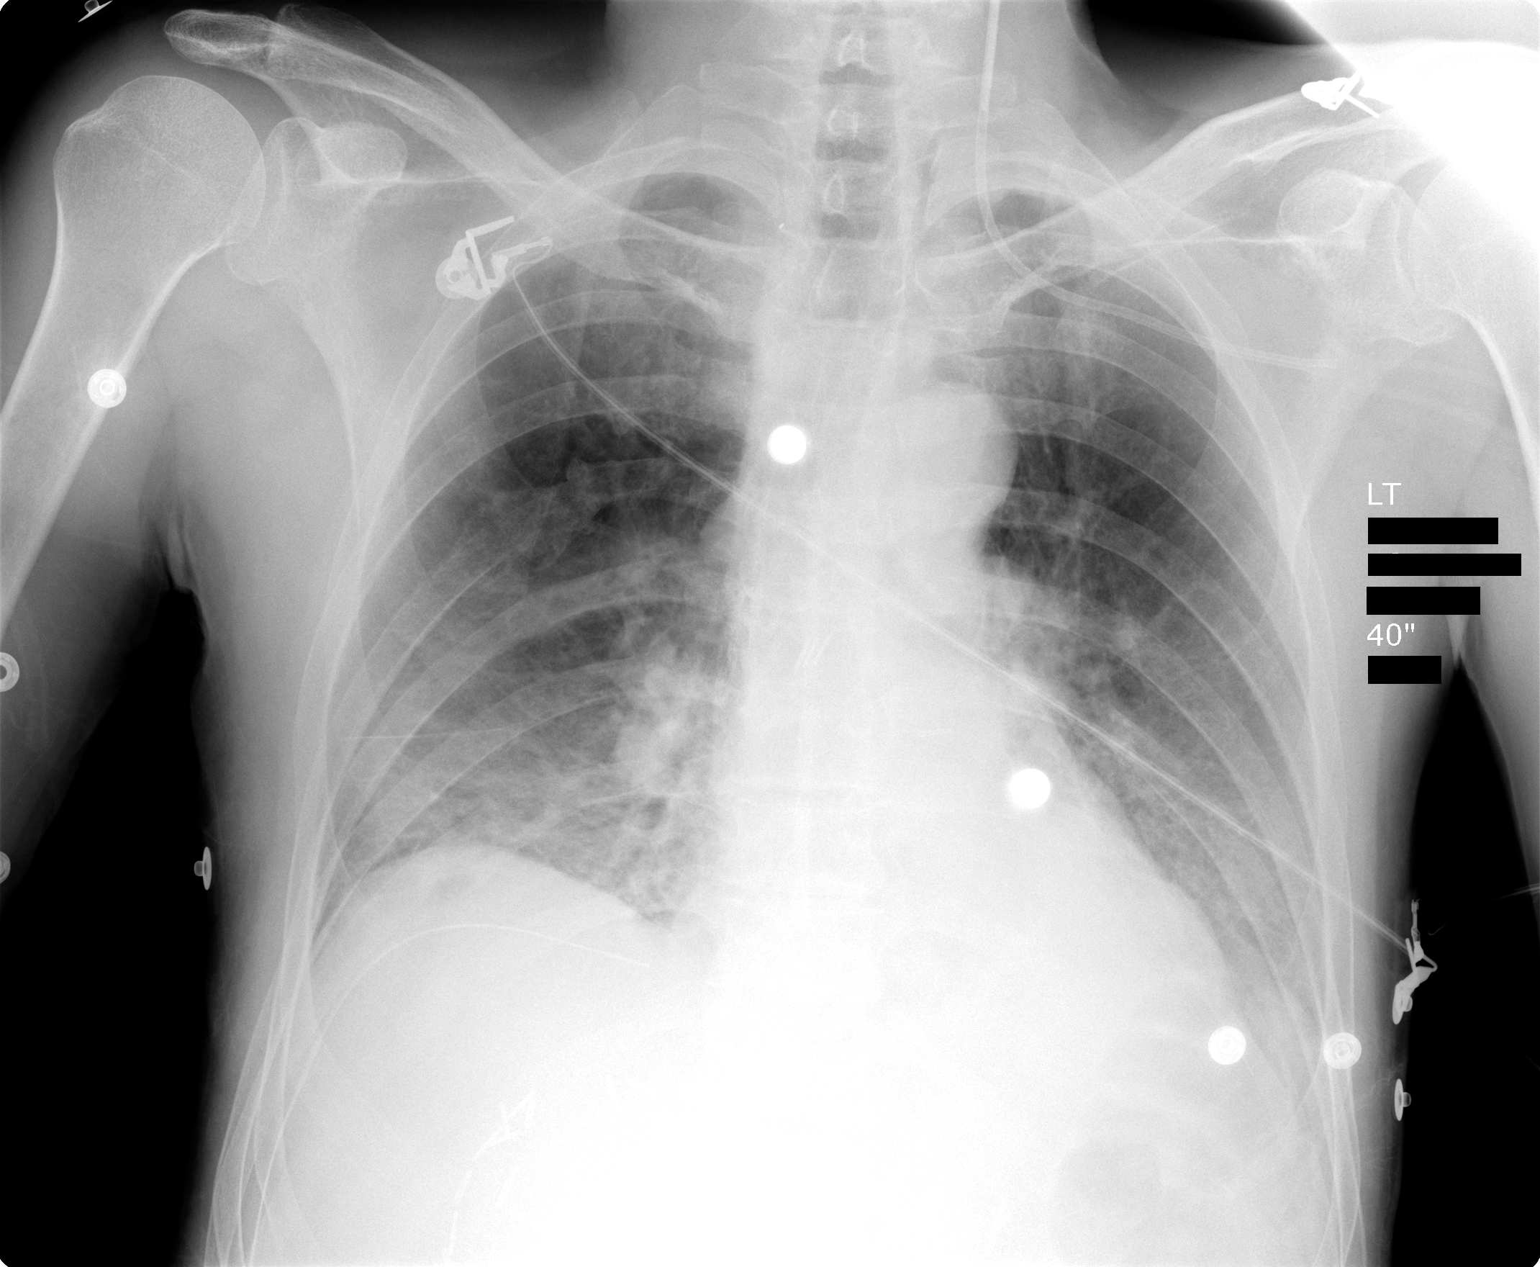

[1 of 1 positions shown; findings below may reference images not displayed]

## 2004-04-19 IMAGING — NM NM LIVER FUNCTION STUDY
1 series · 6 of 6 positions shown · non-contrast
Comparison: none

CLINICAL DATA: Suspect bile leak, abdominal pain.  Status post cholecystectomy. 
 HEPATOBILIARY LIVER FUNCTION
 The patient was given 5 mCi of technetium tagged to Choletec and anterior gamma camera views obtained of the right upper quadrant.  There is normal transition of tracer material through the liver into the common bile duct in the duodenum without evidence of extravasation or bile leak.  There is no visualization of the gallbladder fossa region. 
 IMPRESSION
 Normal transit of tracer through the liver into the small bowel without evidence of bile leak.

[Series 1: he hepatobiliary · 9.42mm/px · 6 of 10 frames shown]
[frame 1/10]
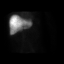
[frame 3/10]
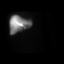
[frame 5/10]
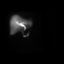
[frame 6/10]
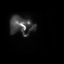
[frame 8/10]
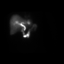
[frame 10/10]
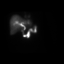

[6 of 6 positions shown; findings below may reference images not displayed]

## 2004-05-27 IMAGING — CR DG CHEST 1V PORT
1 series · 1 of 1 positions shown · non-contrast
Comparison: none

CLINICAL DATA: 62 year old with esophageal cancer. Porta cath insertion. 
PORTABLE CHEST ? 03/09/04 ([DATE])
Comparison, 01/27/04. 
The patient has a left-sided porta cath with tip to the level of the superior vena cava.  Patchy opacities at the lung bases have improved slightly, possibly related to better lung inflation.  Expansile lesion of the right 6th rib is identified.    Emphysematous changes are identified.  There is no evidence for pneumothorax. 
IMPRESSION 
Left porta cath as described.  
Right 6th rib expansile lesion.

[view not recorded]
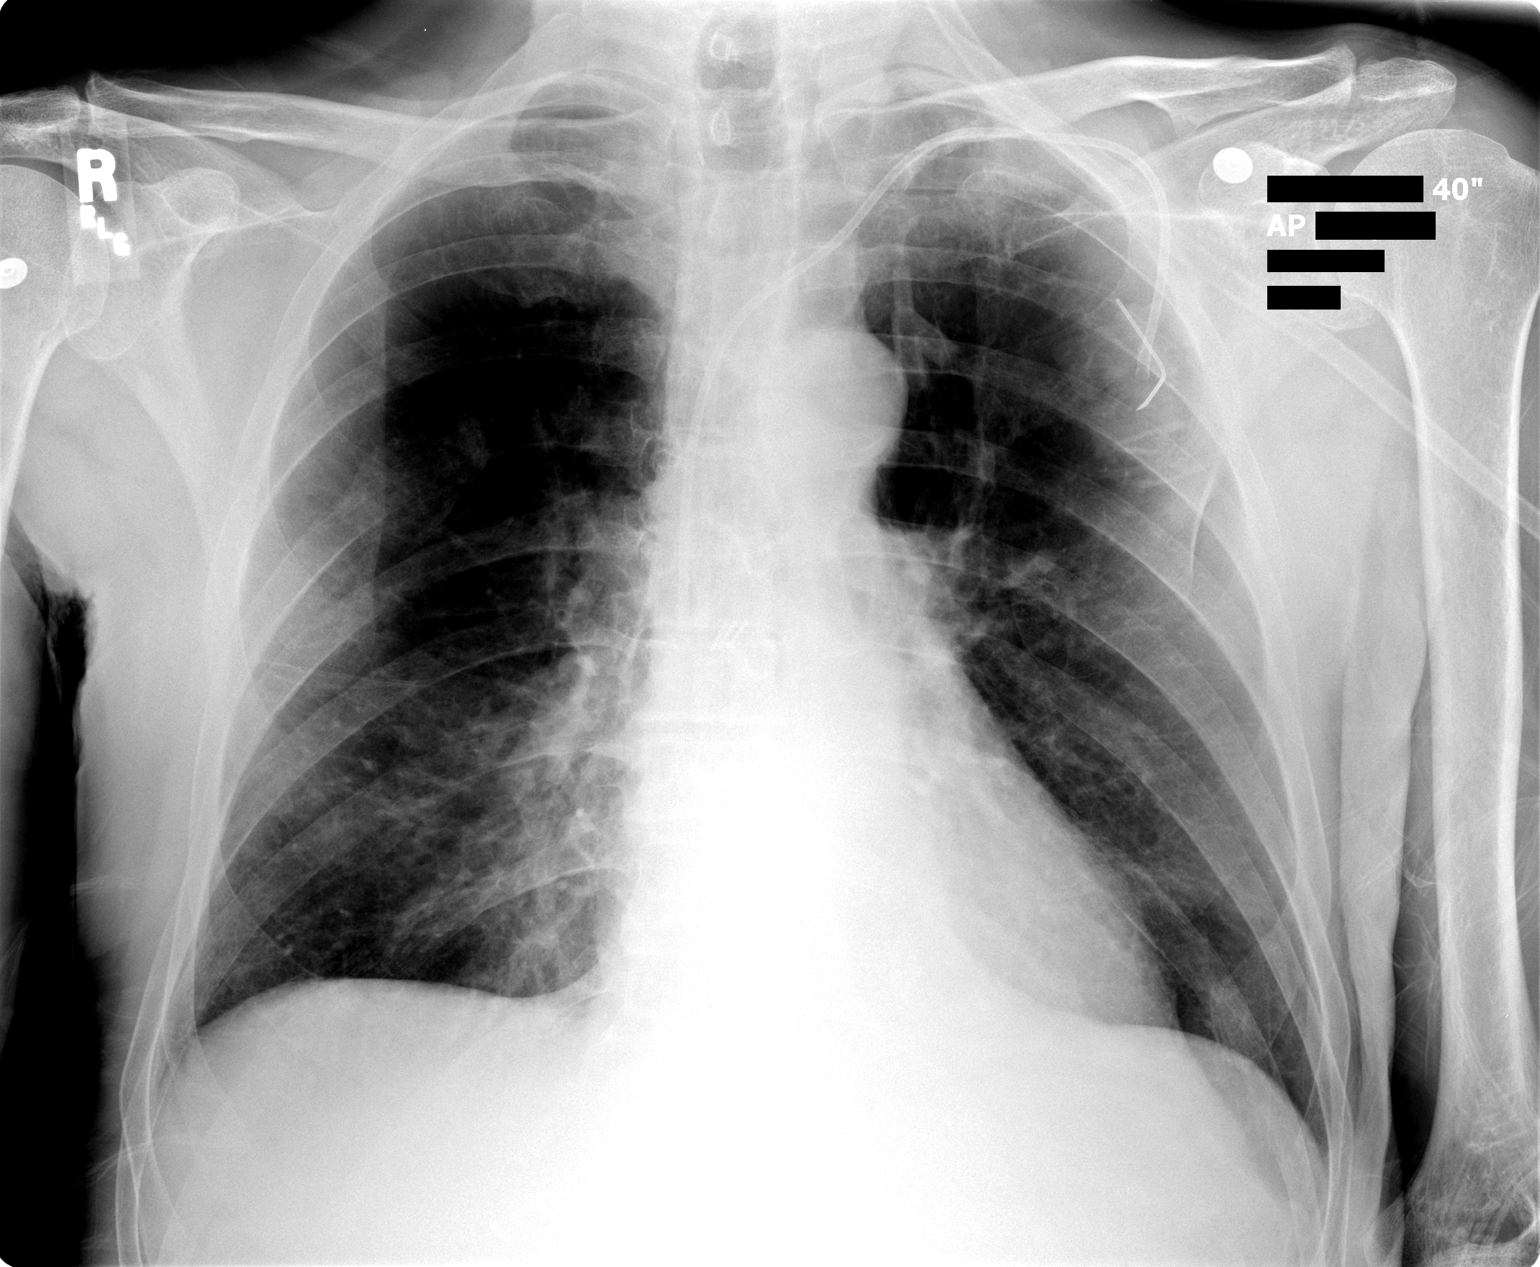

[1 of 1 positions shown; findings below may reference images not displayed]

## 2004-05-29 ENCOUNTER — Ambulatory Visit (HOSPITAL_COMMUNITY): Admission: RE | Admit: 2004-05-29 | Discharge: 2004-05-29 | Payer: Self-pay | Admitting: Hematology & Oncology

## 2004-06-27 IMAGING — CR DG CHEST 2V
2 series · 2 of 2 positions shown · IV contrast (agent unspecified)
Comparison: none

CLINICAL DATA: Fever.
 CHEST, TWO VIEWS
 Comparison 03/09/04.
 Normal heart size, mediastinal contours, and vascularity.  Left subclavian Port-A-Cath tip SVC, unchanged.  Changes of COPD and bronchitis.  No acute infiltrate or effusion.  No pneumothorax.  Expansile rib lesion, posterior right sixth rib, unchanged.  Questionable nodular density lower right chest, not seen on previous exam.  
 IMPRESSION 
 Changes of COPD and bronchitis with questionable right basilar lung nodule.  Computed tomography with contrast recommended.

[view not recorded (1 of 2)]
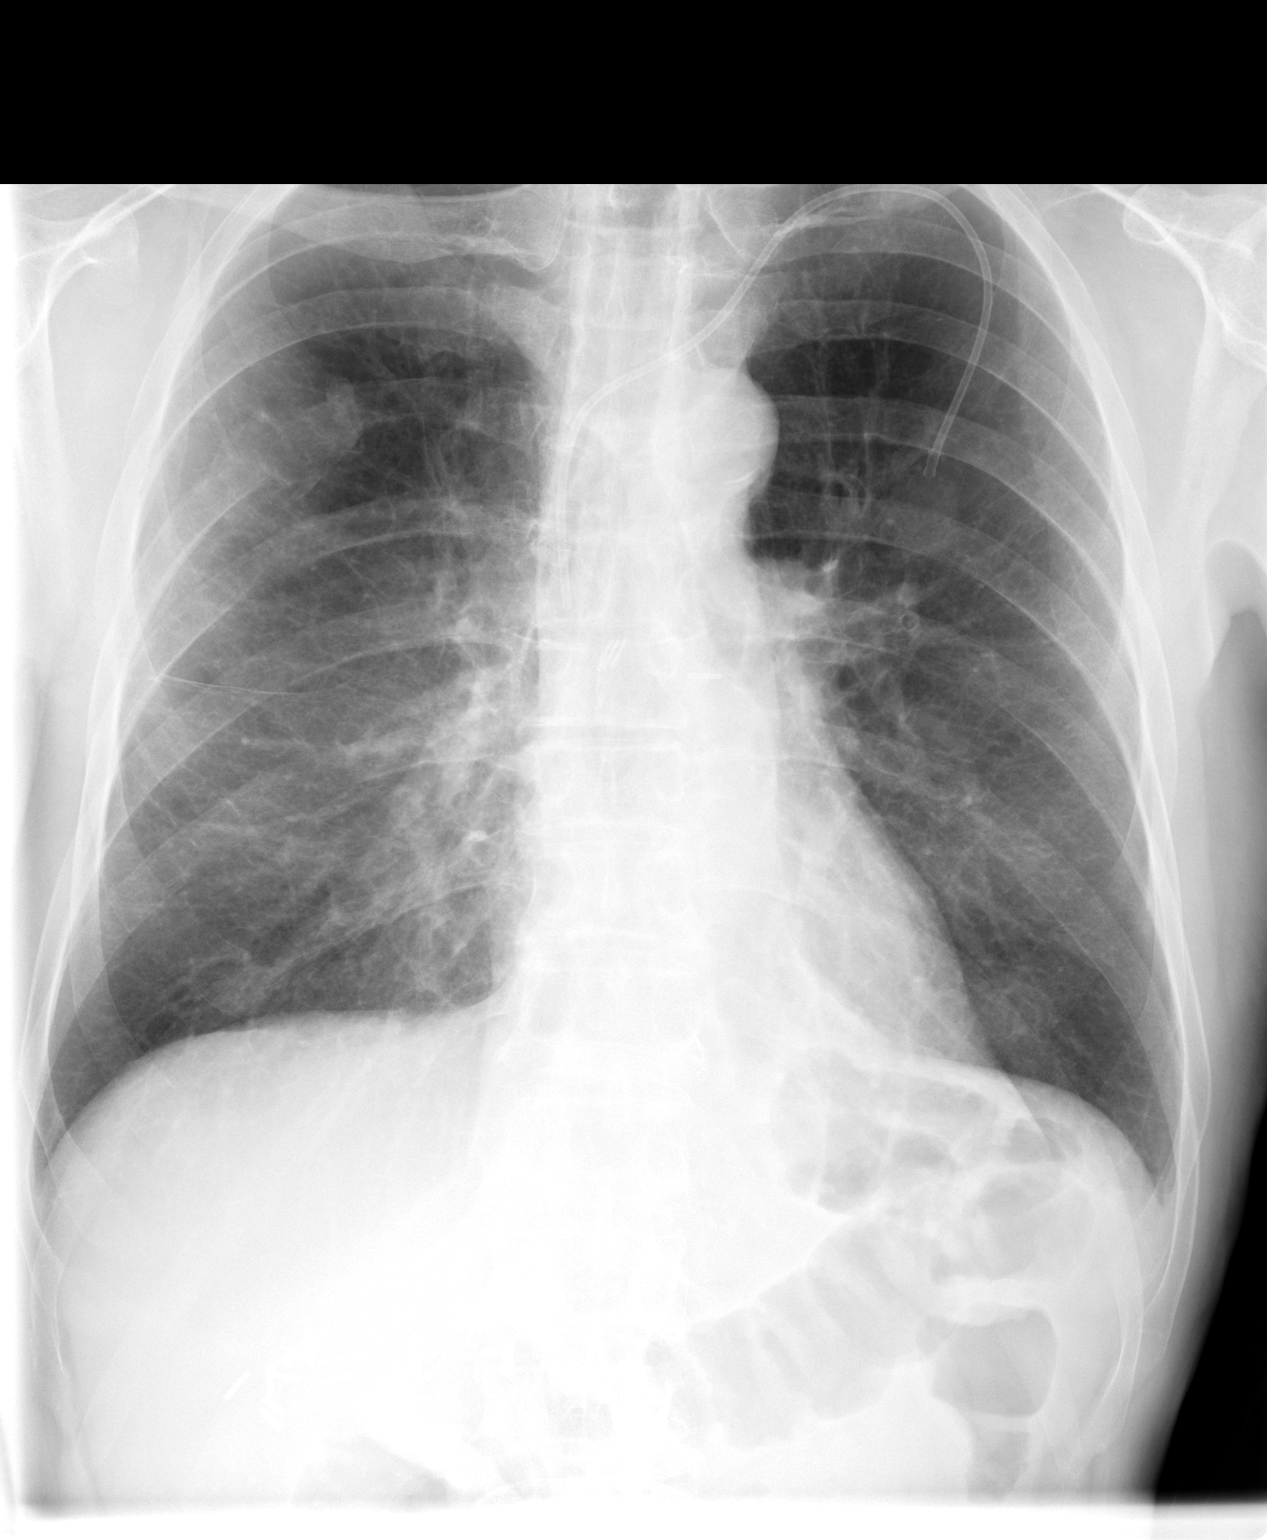

[view not recorded (2 of 2)]
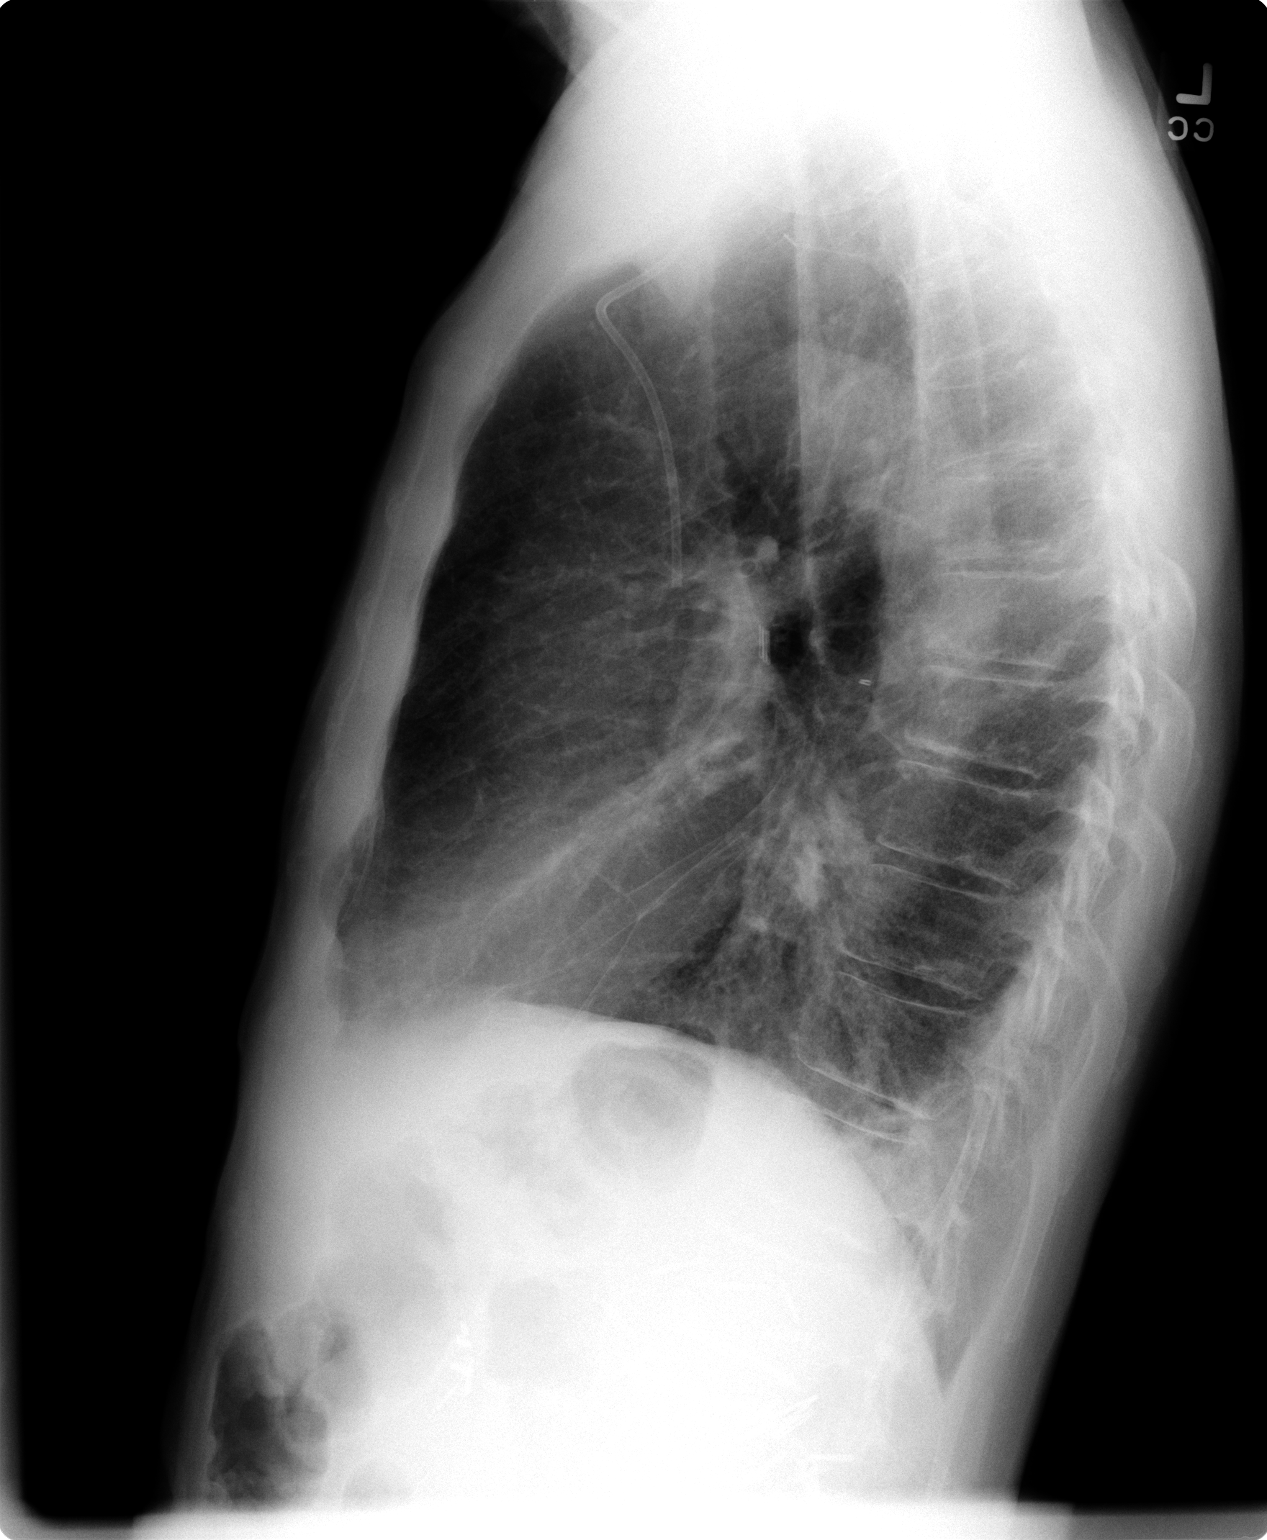

[2 of 2 positions shown; findings below may reference images not displayed]

## 2004-07-02 IMAGING — CT CT CHEST W/ CM
1 of 5 series · 11 of 32 positions shown, 17 images · IV contrast (omnipaque)
Comparison: none

CLINICAL DATA: Esophageal cancer.  
 CT CHEST WITH CONTRAST
 Comparing 12/07/03.
 Contiguous axial CT images were obtained from the lung apices through the bases following intravenous administration of 150 cc Omnipaque 300 IV contrast.  
 The patient has had a gastric pull through procedure.  There is a moderate left pleural effusion.  There is a small right pleural effusion.  Old injury or lesion of the right sixth rib is stable.  There is centrilobular emphysema.  There is a 4 mm nodular density in the periphery of the left upper lobe on image #32 of series 3.  This is statistically likely to be benign but technically non specific and attention to this region on any follow-up studies is recommended.  
 There are some small right upper paratracheal lymph nodes which are not pathologically enlarged by CT size criteria.  
 IMPRESSION
 1.  Moderate left and very small right pleural effusions.  These are new.
 2.  Gastric pull through.
 3.  Small nodule in the left upper lobe peripherally, statistically likely to be benign but technically non specific.  
 4.  Old right sixth rib lesion.
 CT ABDOMEN WITH CONTRAST
 Contiguous axial CT images were obtained from the lung bases through the iliac crests following oral and IV contrast.
 The patient has had prior right adrenalectomy.  The previously identified right sided fluid collection is resolved.  There is a stable right mid kidney cyst.  Left adrenal appears normal.  No hepatic or splenic mass is identified.  Gallbladder is surgically absent.  There is some scattered small retroperitoneal lymph nodes, similar to the prior exam.  There is an abdominal aortic aneurysm which is fusiform and infrarenal, measuring 3.1 x 3.0 cm on image #38.  This appears similar to the prior exam.  Mural thrombus is noted.  
 1.  Scattered but small para-aortic lymph nodes, stable from the prior exam.
 2.  Near complete resolution of the hepatorenal fossa fluid collection noted on the prior study.
 3.  Stable abdominal aortic aneurysm.  
 CT PELVIS WITH CONTRAST
 Contiguous axial CT images were obtained from the iliac crests to the proximal femurs following oral and IV contrast.  
 The appendix and visualized pelvic bowel appear unremarkable.  No pelvic side wall adenopathy.  No free pelvic fluid.  
 No evidence of pelvic metastatic disease.

[Series 4: a/p 5.0 b30f · axial · 0.74mm/px · z∈[-730,-344]mm · 11 of 93 slices shown, 17 images]
[im 8/93  soft-tissue]
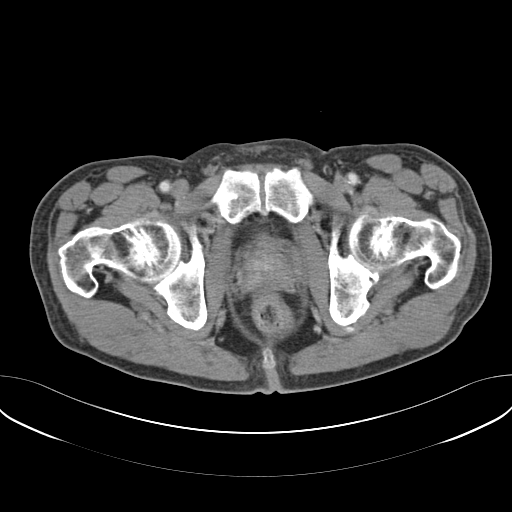
[im 8/93  bone]
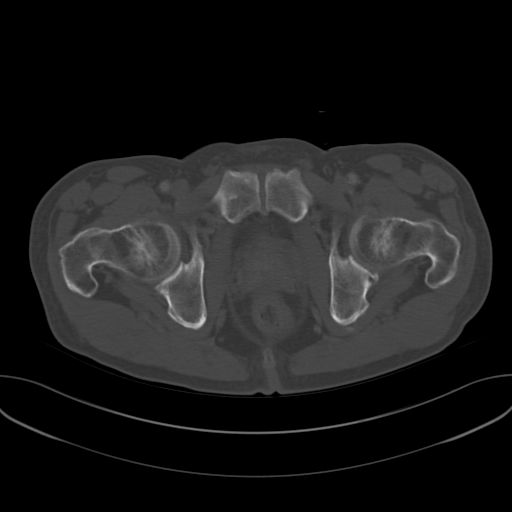
[im 16/93  soft-tissue]
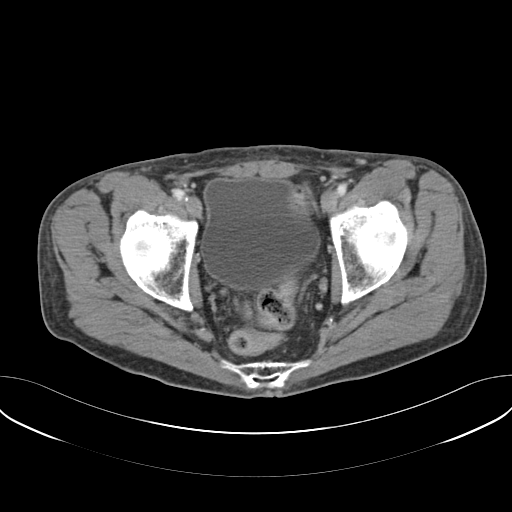
[im 24/93  soft-tissue]
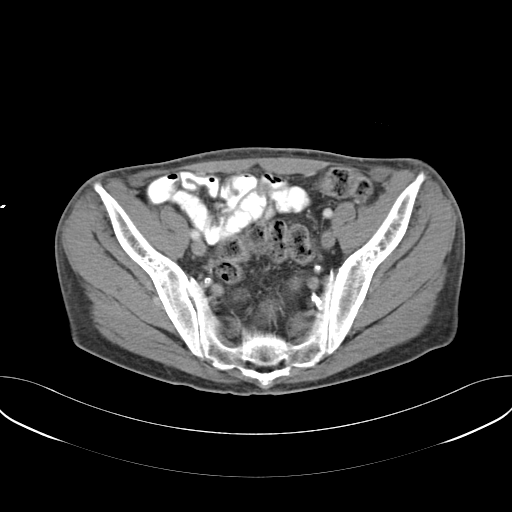
[im 31/93  soft-tissue]
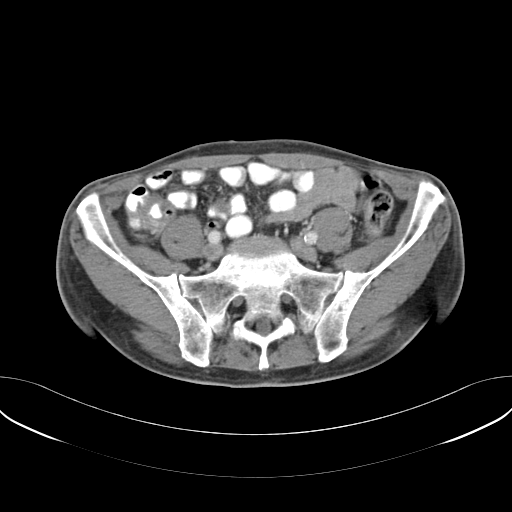
[im 39/93  soft-tissue]
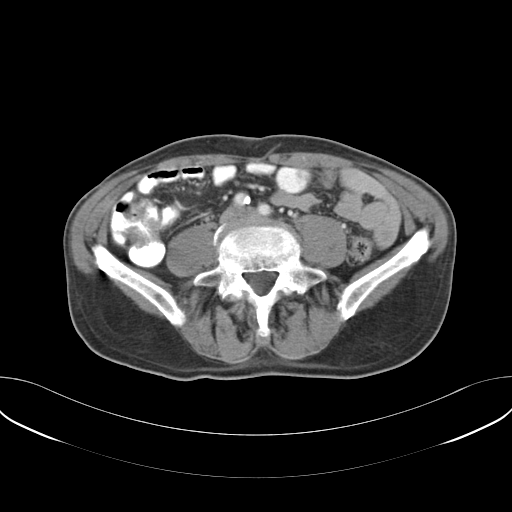
[im 47/93  soft-tissue]
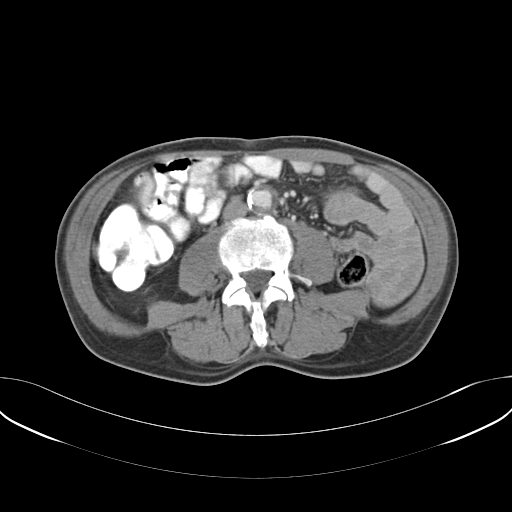
[im 54/93  soft-tissue]
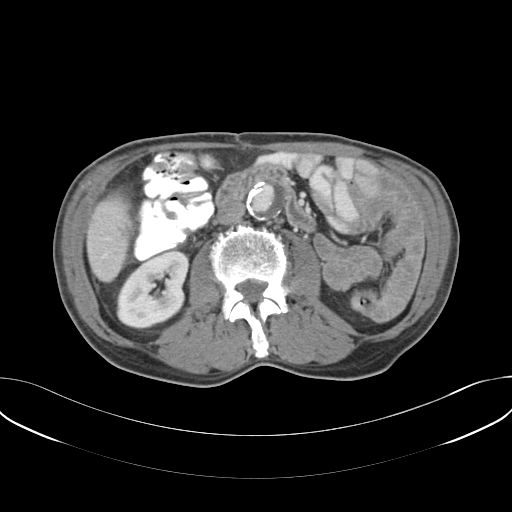
[im 62/93  soft-tissue]
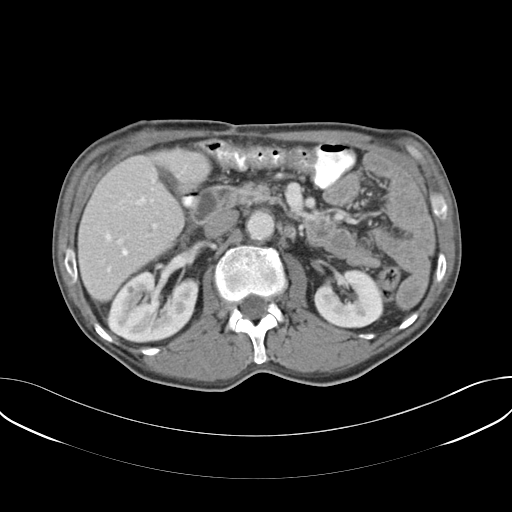
[im 62/93  lung]
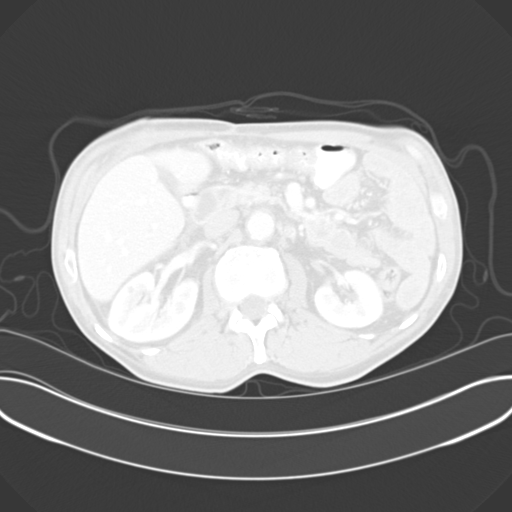
[im 70/93  soft-tissue]
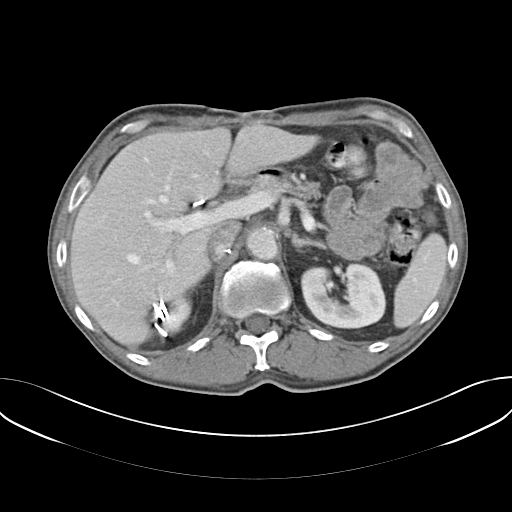
[im 70/93  lung]
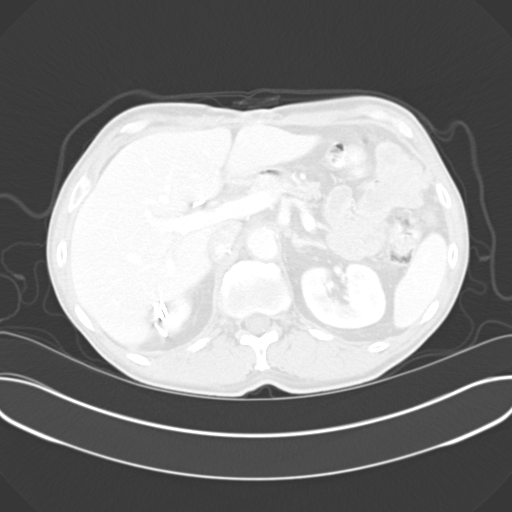
[im 70/93  bone]
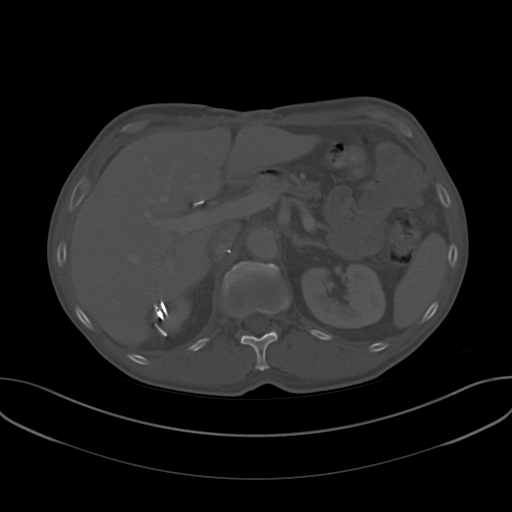
[im 77/93  soft-tissue]
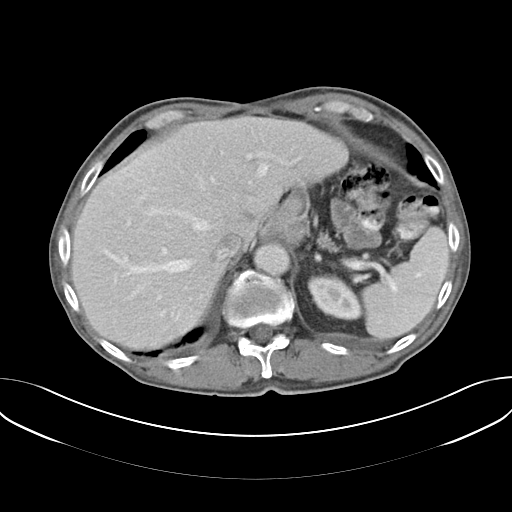
[im 77/93  lung]
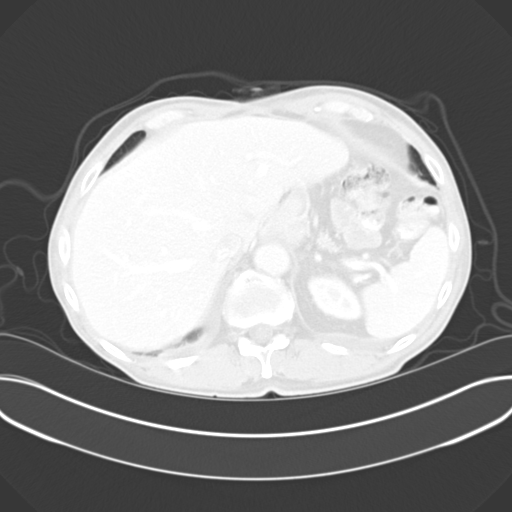
[im 85/93  soft-tissue]
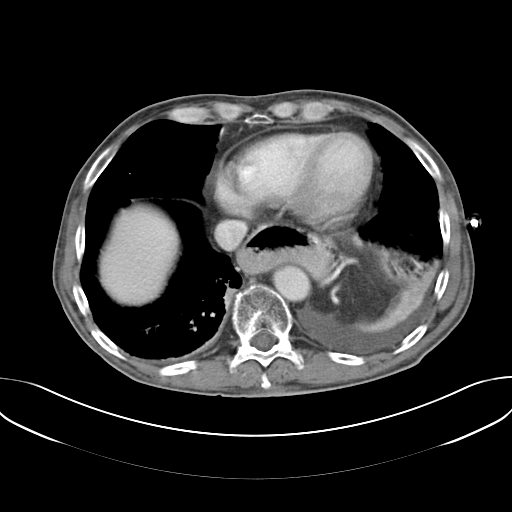
[im 85/93  lung]
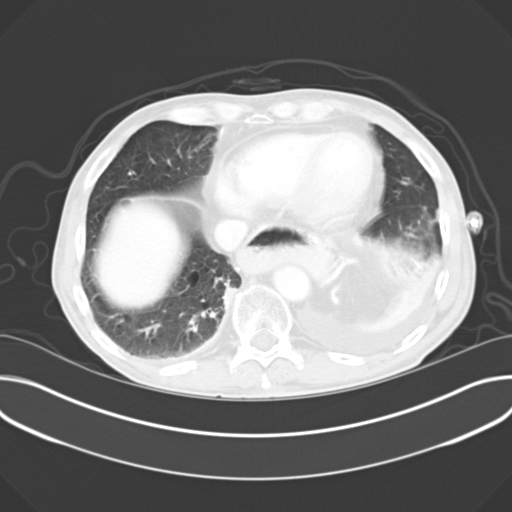

[11 of 32 positions shown; findings below may reference images not displayed]

## 2004-07-27 ENCOUNTER — Ambulatory Visit (HOSPITAL_COMMUNITY): Admission: RE | Admit: 2004-07-27 | Discharge: 2004-07-27 | Payer: Self-pay | Admitting: Hematology & Oncology

## 2004-08-16 IMAGING — CT CT ABDOMEN W/ CM
1 of 4 series · 13 of 32 positions shown, 19 images · IV contrast (omnipaque)
Comparison: 04/14/04.

CLINICAL DATA: Esophageal cancer ? follow-up exam.
 CT CHEST, ABDOMEN, AND PELVIS
TECHNIQUE: Multidetector helical imaging carried out through the chest, abdomen, and pelvis utilizing oral and IV contrast ? 150 cc of Omnipaque 300.

[Series 4: a/p 5.0 b30f · axial · 0.62mm/px · z∈[-610,-215]mm · 13 of 93 slices shown, 19 images]
[im 7/93  soft-tissue]
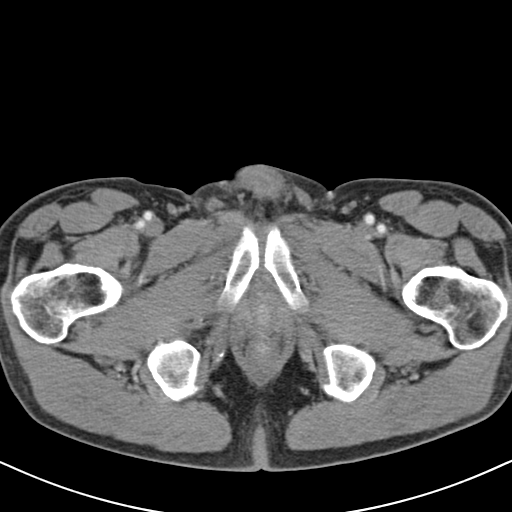
[im 7/93  bone]
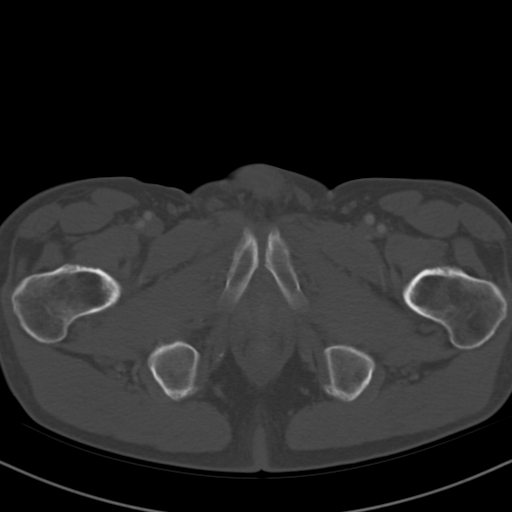
[im 14/93  soft-tissue]
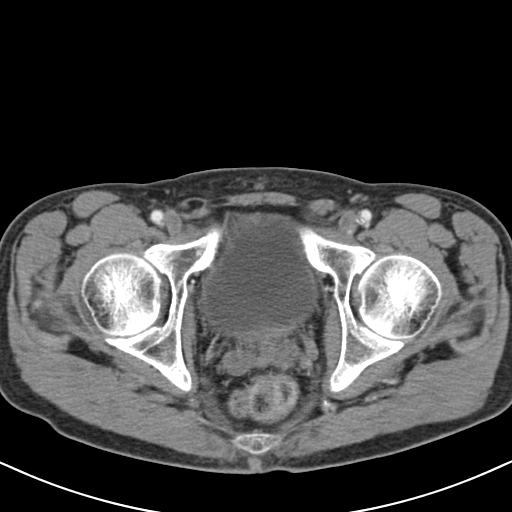
[im 20/93  soft-tissue]
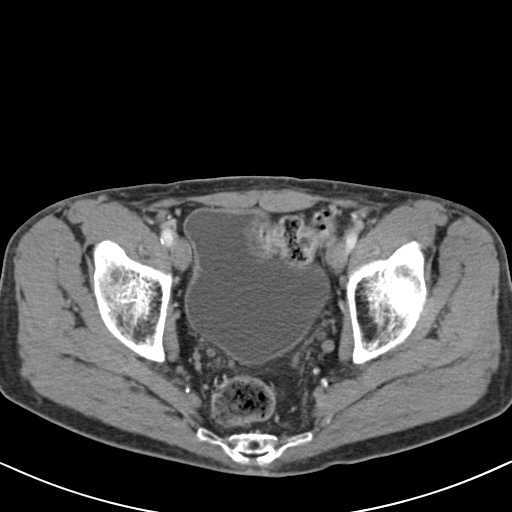
[im 27/93  soft-tissue]
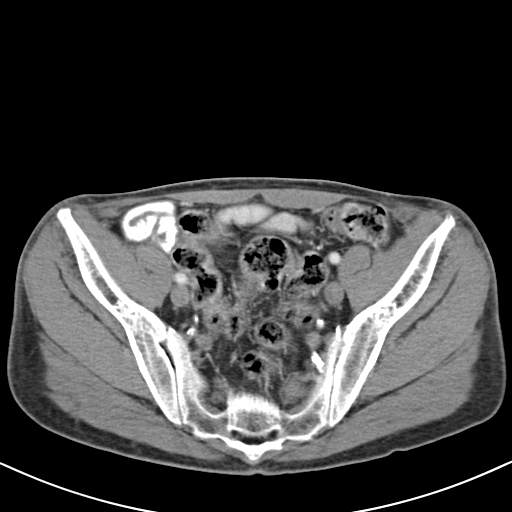
[im 33/93  soft-tissue]
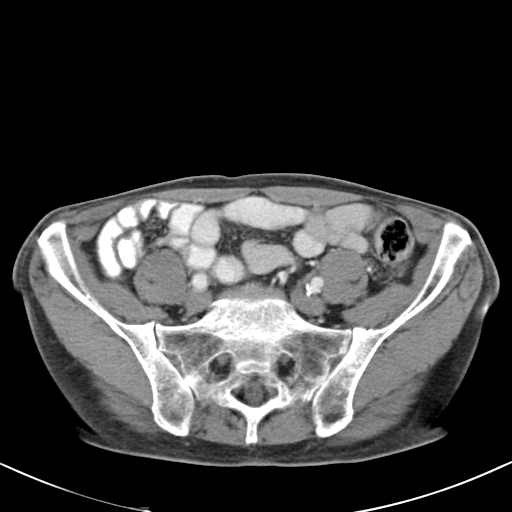
[im 40/93  soft-tissue]
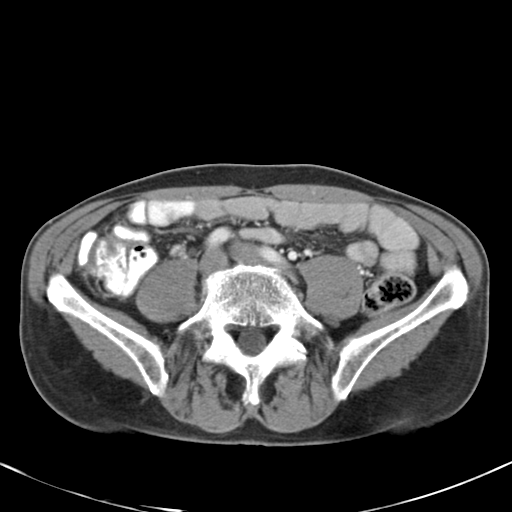
[im 47/93  soft-tissue]
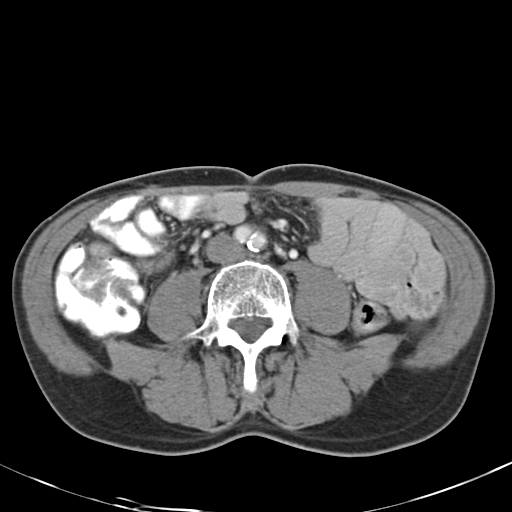
[im 53/93  soft-tissue]
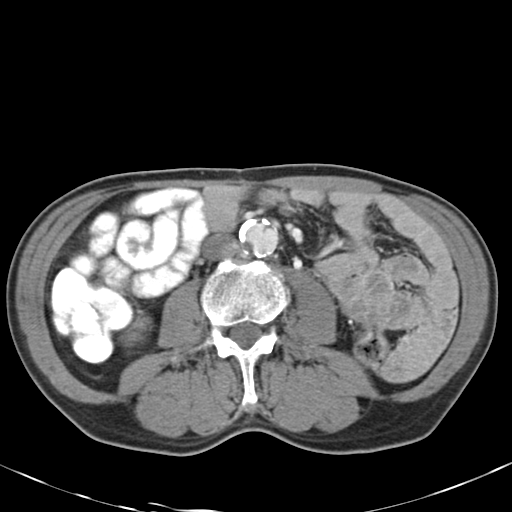
[im 60/93  soft-tissue]
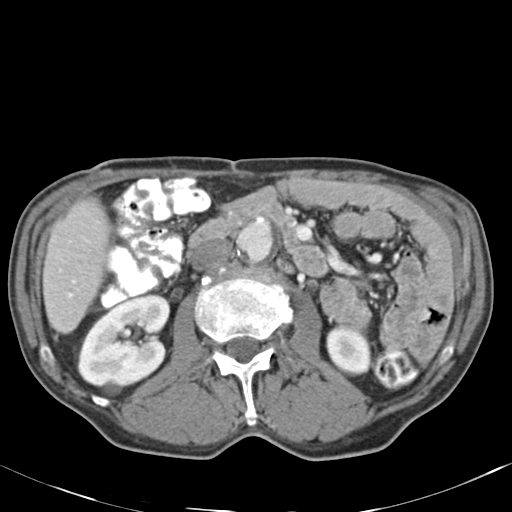
[im 60/93  bone]
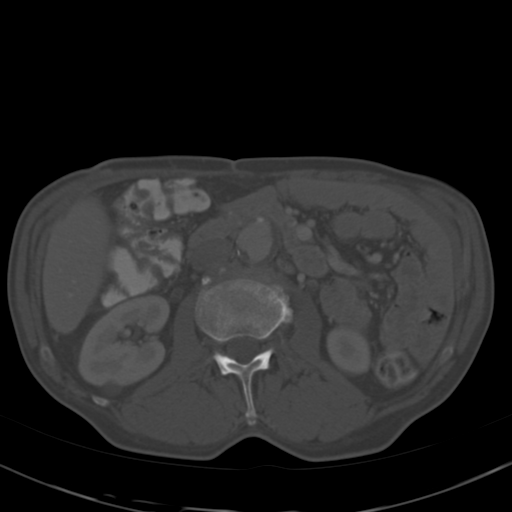
[im 66/93  soft-tissue]
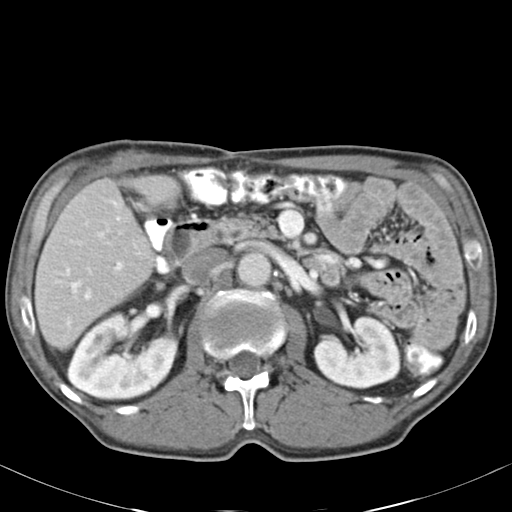
[im 66/93  lung]
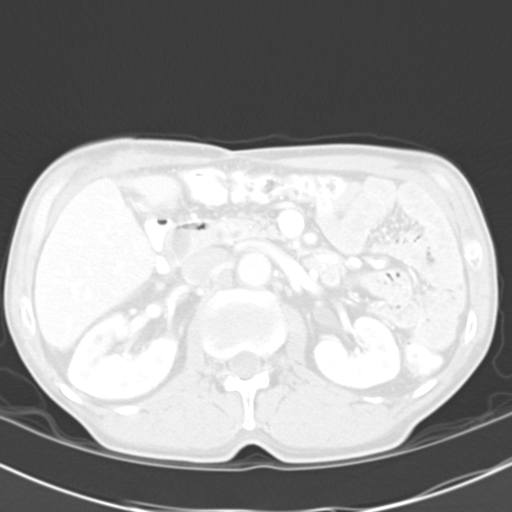
[im 73/93  soft-tissue]
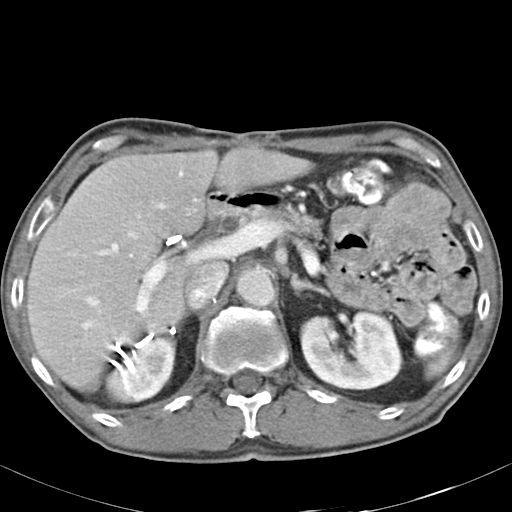
[im 73/93  lung]
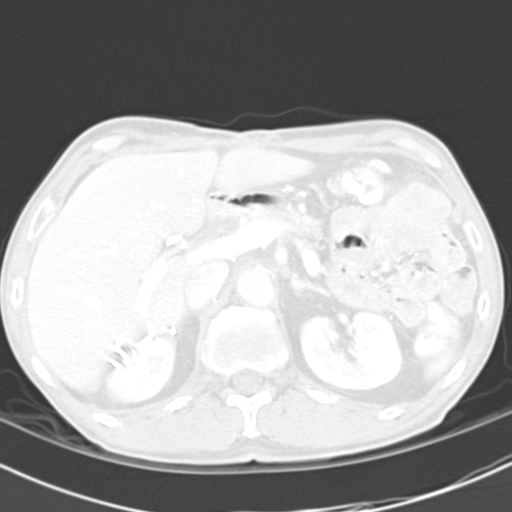
[im 79/93  soft-tissue]
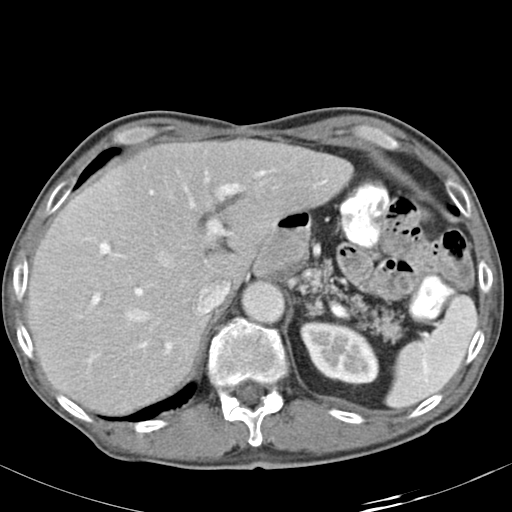
[im 79/93  lung]
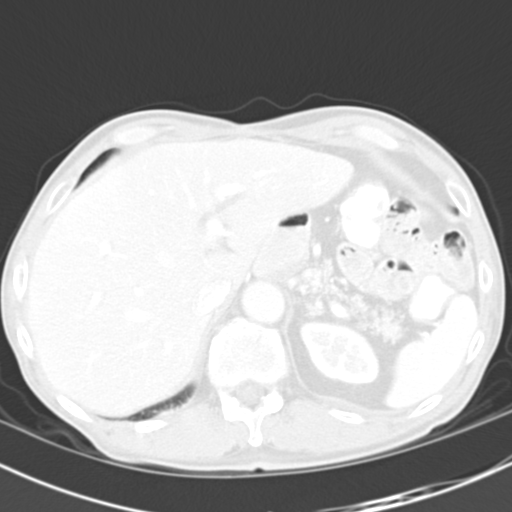
[im 86/93  soft-tissue]
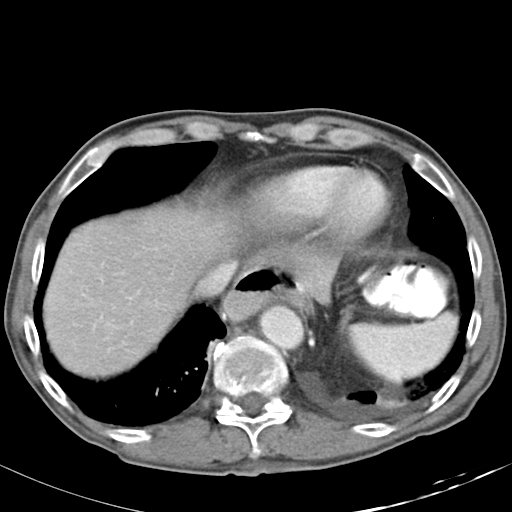
[im 86/93  lung]
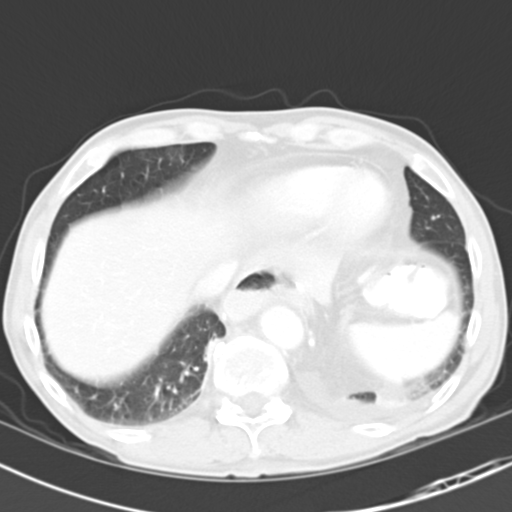

[13 of 32 positions shown; findings below may reference images not displayed]

CT CHEST 
 Status post gastric pull-through with moderate but expected dilatation of the intrathoracic stomach.  No mediastinal adenopathy.  Small left effusion that is slightly smaller when compared to the prior exam.  No pericardial fluid.
 Small pleural-based nodule on the left is no longer evident.  No other lung nodules are identified.  Scattered blebs are as before. 
 IMPRESSION
 1.  Left pleural effusion smaller. 
 2.  Left upper lobe nodule is no longer evident.
 3.  Chronic lung changes, but no definite evidence at this time for active tumor or active inflammatory disease. 
 CT ABDOMEN 
 Postsurgical changes in the region of the right adrenal gland as before.  Left adrenal normal.  No renal lesions.  No adenopathy or ascites.  Pancreas and spleen within normal limits.  No focal lesions of the liver.  Abdominal aortic aneurysm unchanged in size or contour.  It measures 31 x 30 mm on image #36.  At about the level of the beginning of the aneurysm, there are some left retroperitoneal nodes measuring 10 mm or less.  No definite change. 
 IMPRESSION
 1.  Stable abdominal aortic aneurysm.
 2.  Stable left paratracheal lymph nodes that are 1 cm or less. 
 3.  No new findings.
 CT PELVIS
 No focal masses, adenopathy, or fluid collections.  Pelvic sidewalls and presacral space normal.  
 IMPRESSION
 No pathological findings in the pelvis.

## 2004-10-14 IMAGING — CT CT CHEST W/ CM
1 of 3 series · 13 of 32 positions shown, 18 images · IV contrast (omnipaque)
Comparison: 05/29/04.

CLINICAL DATA: Esophageal cancer.
 CT CHEST, ABDOMEN, AND PELVIS WITH CONTRAST 07/27/04 
 Multidetector helical CT imaging is performed through the chest, abdomen, and pelvis following dilute oral contrast and 150 cc of Omnipaque 300 IV.

[Series 2: cap w/iv 5.0 b30f · axial · 0.74mm/px · z∈[-652,-77]mm · 13 of 131 slices shown, 18 images]
[im 8/131  soft-tissue]
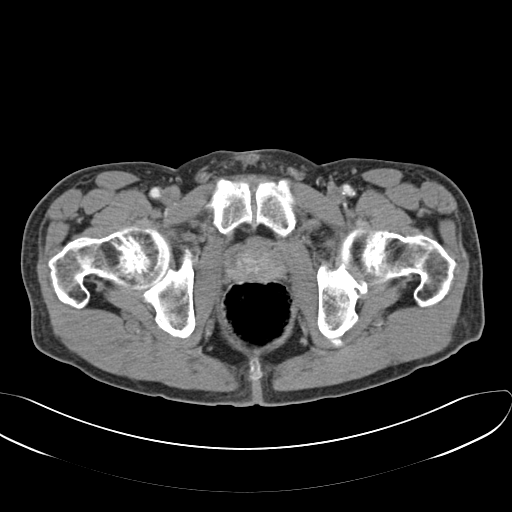
[im 8/131  bone]
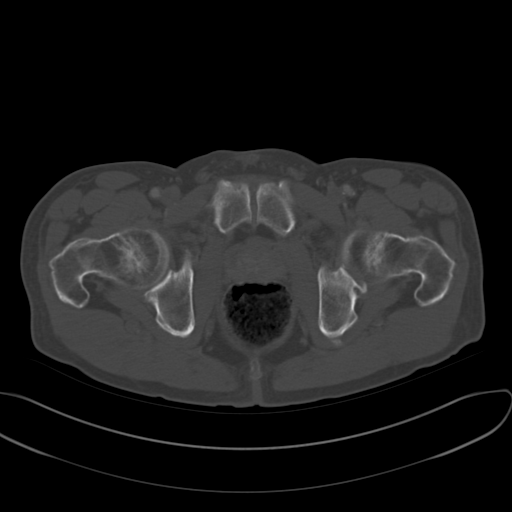
[im 23/131  soft-tissue]
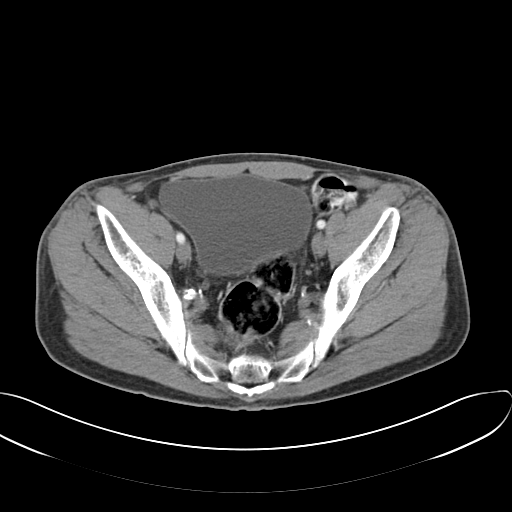
[im 31/131  soft-tissue]
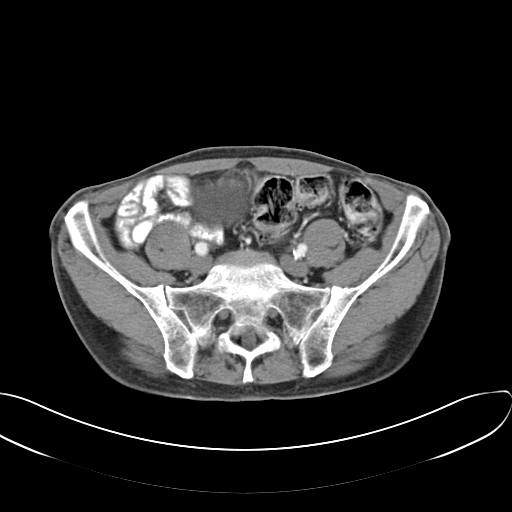
[im 39/131  soft-tissue]
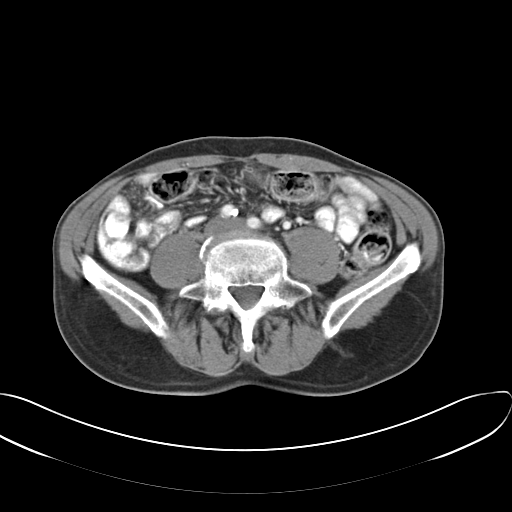
[im 54/131  soft-tissue]
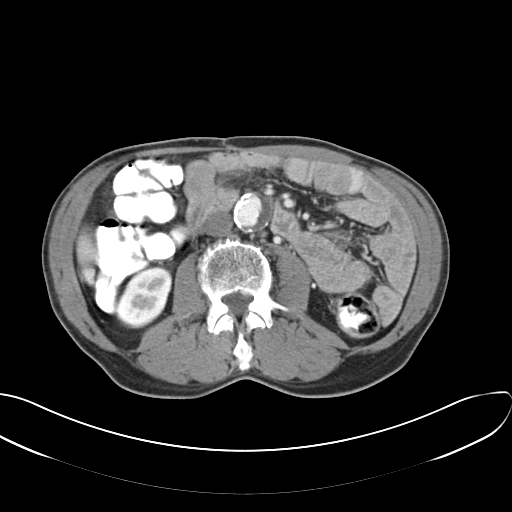
[im 62/131  soft-tissue]
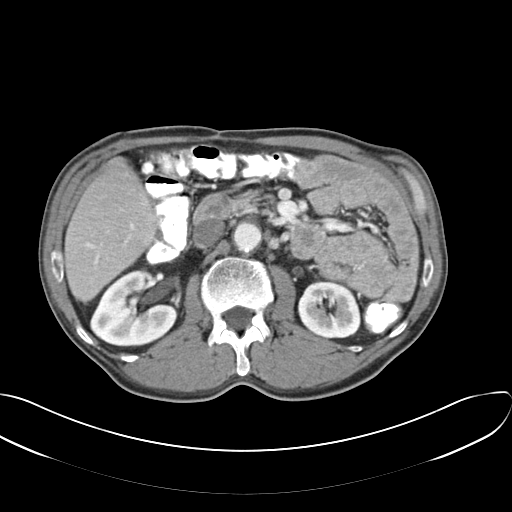
[im 69/131  soft-tissue]
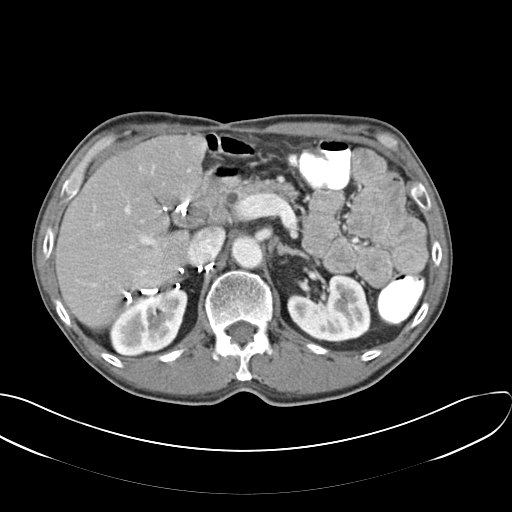
[im 85/131  soft-tissue]
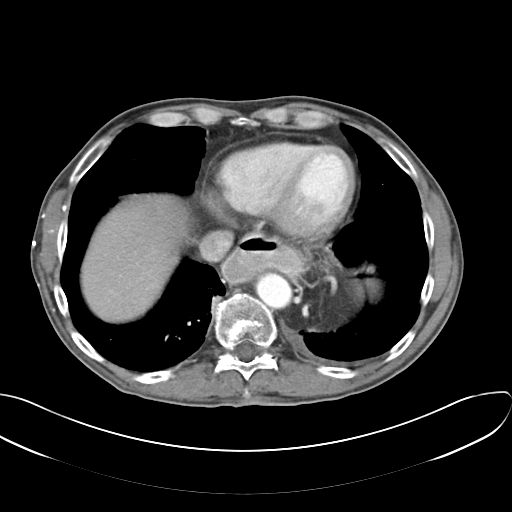
[im 92/131  soft-tissue]
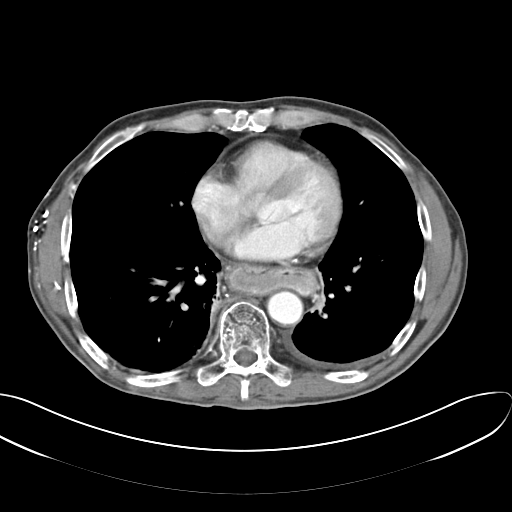
[im 92/131  bone]
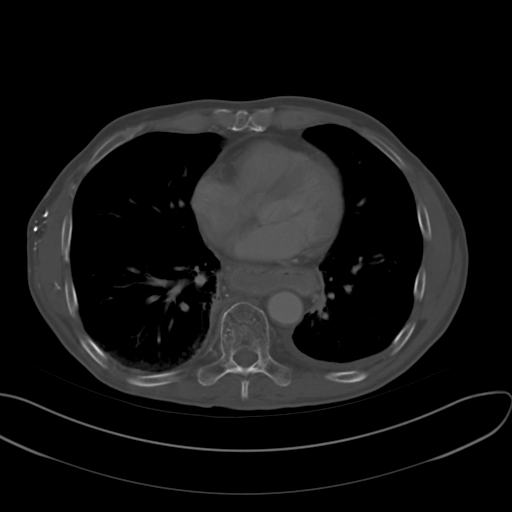
[im 100/131  soft-tissue]
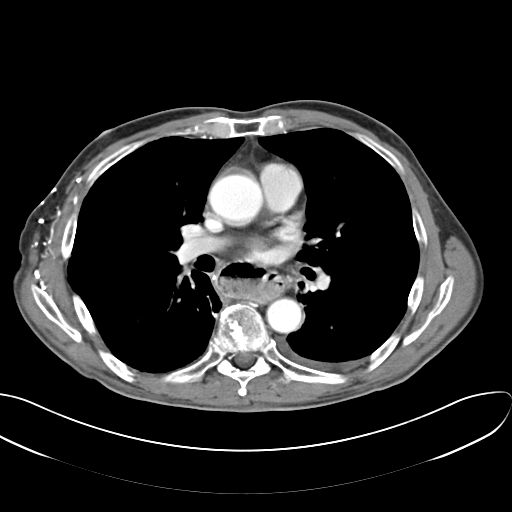
[im 100/131  lung]
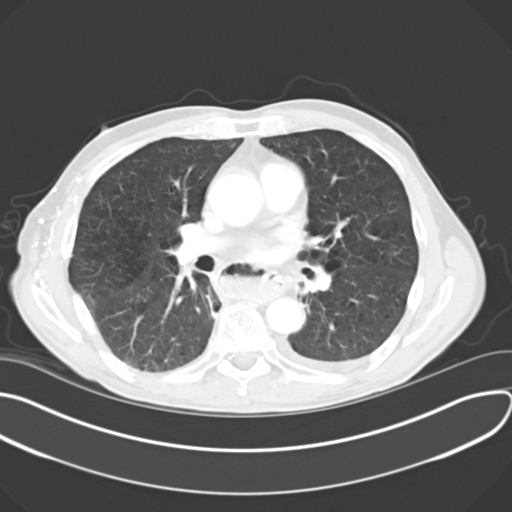
[im 108/131  lung]
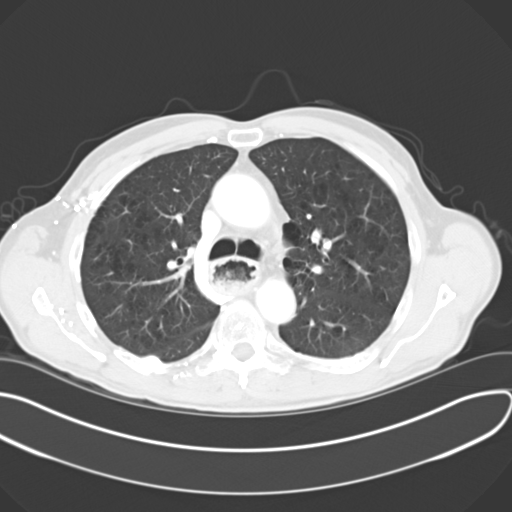
[im 115/131  soft-tissue]
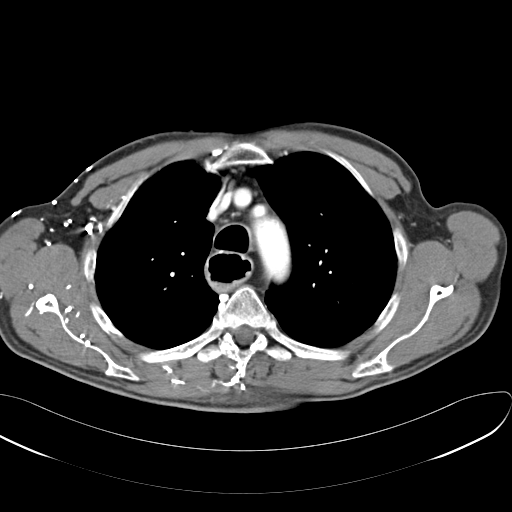
[im 115/131  lung]
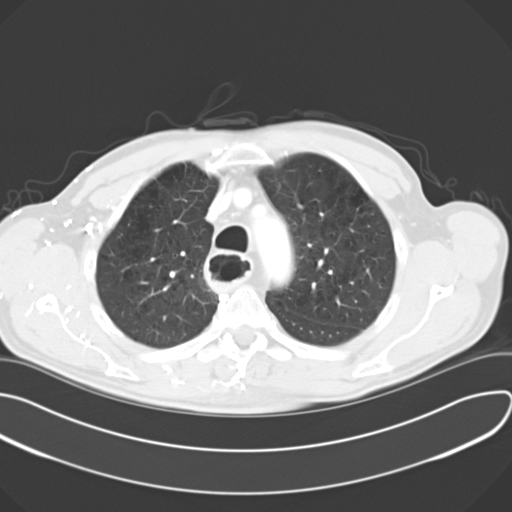
[im 123/131  soft-tissue]
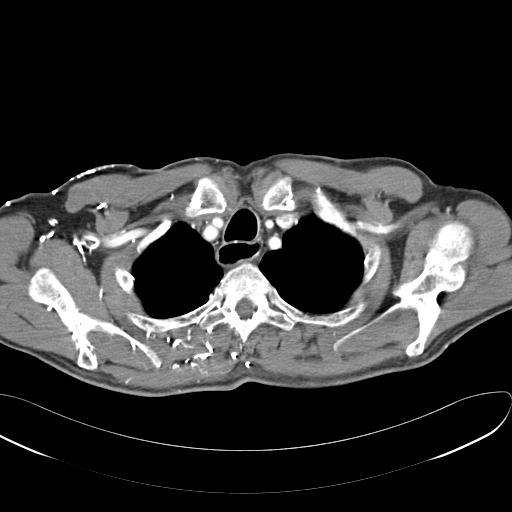
[im 123/131  lung]
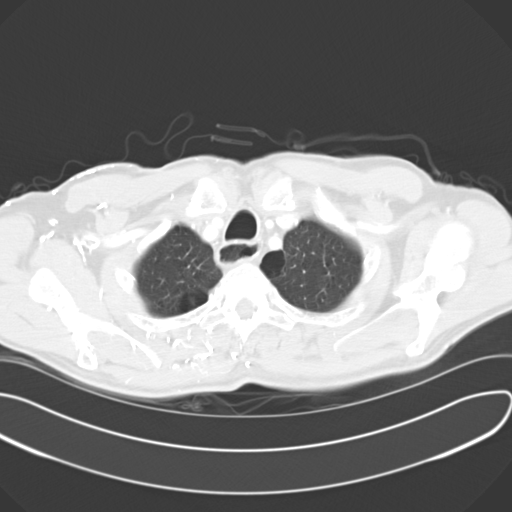

[13 of 32 positions shown; findings below may reference images not displayed]

Previously-described left pleural effusion has decreased further.  There may be a tiny amount of residual fluid remaining.  Changes from gastric pull-through procedure again noted, unchanged.  No mediastinal, hilar, or axillary adenopathy.
 Emphysematous changes are noted in the lungs.  No pulmonary nodules or masses or focal airspace opacities.  
 There are extensive collateral vessels noted in the right side of the chest.  The subclavian vein or internal jugular vein on the right cannot be visualized.  I suspect chronic occlusion. 
 IMPRESSION
 1.  Status post gastric pull-through procedure.  No change in the postoperative appearance since the prior study.  
 2.  Decrease in left pleural effusion, now very tiny effusion remains.
 3.  COPD.
 4.  Question of occlusion of right subclavian vein and possibly right internal jugular vein with extensive collaterals seen in the right chest wall. 
 CT ABDOMEN 
 Again postoperative change is noted in the abdomen from gastric pull-through.  The patient is status post cholecystectomy.  No focal lesion is seen in the liver, spleen, pancreas, or left adrenal.  There has been resection of the right adrenal gland.  Kidneys unremarkable except for a small cyst in the mid pole of the right kidney, stable.  
 There is a stable abdominal aortic aneurysm measuring 3.2 cm compared to 3.1 cm previously.  Extensive mural plaque and either focal dissection or ulcerative plaque noted in the distal abdominal aorta.  This is unchanged, as well.  No free fluid, free air, or adenopathy in the abdomen.  There are scattered small retroperitoneal lymph nodes, none of which are pathologically enlarged or changed since prior study.  
 IMPRESSION
 1.  Stable abdominal aortic aneurysm.  Within the distal abdominal aorta, there is either a focal dissection or an ulcerative plaque.  Appearance is stable since prior study.
 2.  Stable postoperative changes in the abdomen. 
 3.  No evidence of metastatic disease.
 CT PELVIS
 No free fluid, free air, or adenopathy.  Bowel and bladder grossly unremarkable. 
 IMPRESSION
 No evidence of metastatic disease in the pelvis.

## 2004-10-17 ENCOUNTER — Ambulatory Visit (HOSPITAL_COMMUNITY): Admission: RE | Admit: 2004-10-17 | Discharge: 2004-10-17 | Payer: Self-pay | Admitting: Hematology & Oncology

## 2004-10-19 ENCOUNTER — Encounter: Admission: RE | Admit: 2004-10-19 | Discharge: 2004-10-19 | Payer: Self-pay | Admitting: Thoracic Surgery

## 2004-10-29 ENCOUNTER — Ambulatory Visit: Payer: Self-pay | Admitting: Hematology & Oncology

## 2005-01-04 IMAGING — CT CT CHEST W/ CM
1 of 3 series · 13 of 32 positions shown, 18 images · IV contrast (omnipaque)
Comparison: 05/29/04 and 07/27/04.

CLINICAL DATA: Esophageal carcinoma.  Status post resection, chemotherapy, and radiation therapy.  Restaging.
 CT OF THE CHEST, ABDOMEN, AND PELVIS WITH CONTRAST:
 Multidetector helical CT imaging was performed during the IV bolus injection of 150 cc of Omnipaque 300.  Oral contrast was given.

[Series 2: cap w/iv 5.0 b30f · axial · 0.74mm/px · z∈[-671,-91]mm · 13 of 132 slices shown, 18 images]
[im 8/132  soft-tissue]
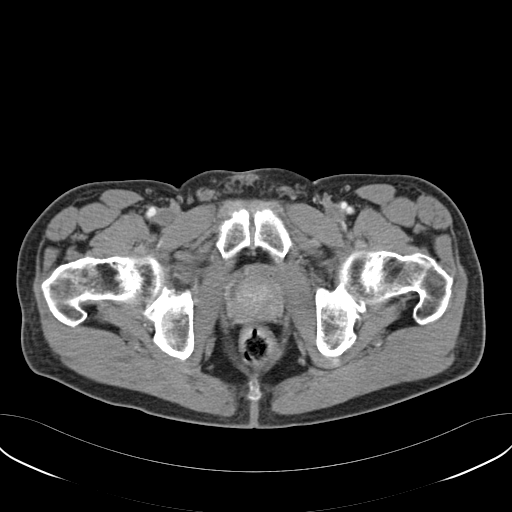
[im 8/132  bone]
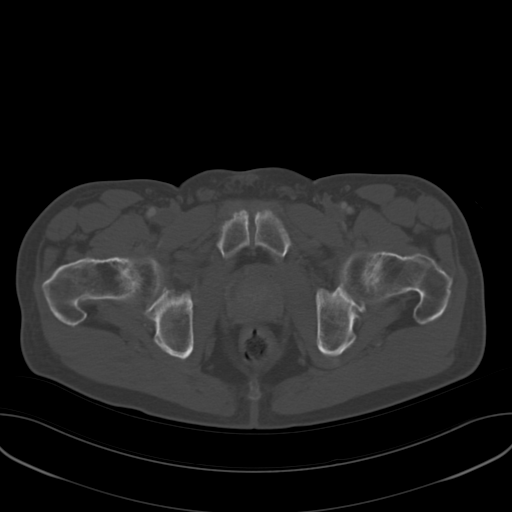
[im 24/132  soft-tissue]
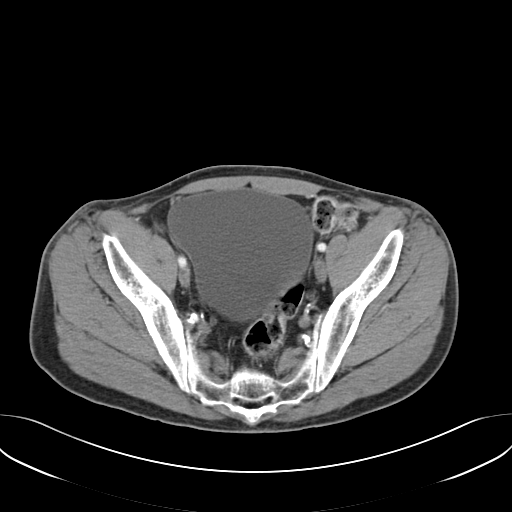
[im 31/132  soft-tissue]
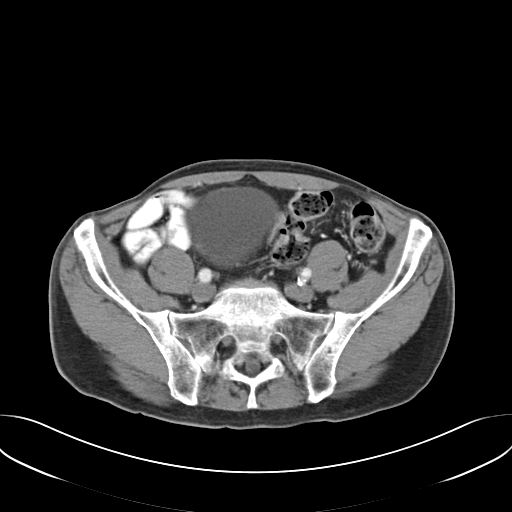
[im 39/132  soft-tissue]
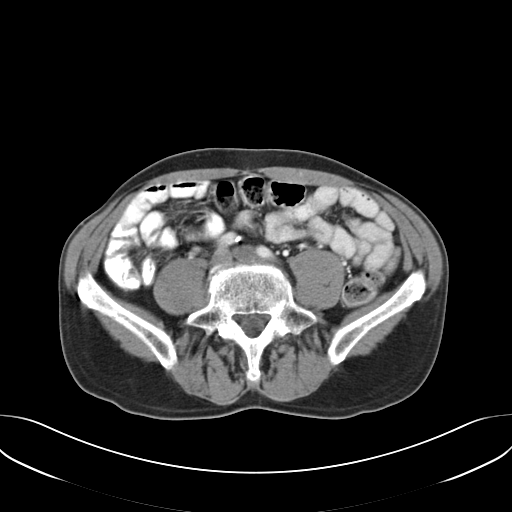
[im 54/132  soft-tissue]
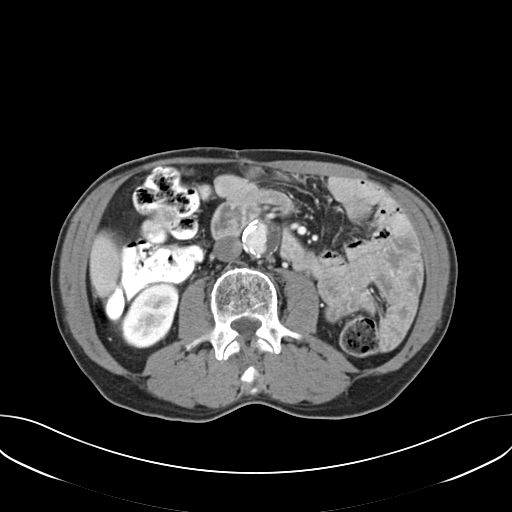
[im 62/132  soft-tissue]
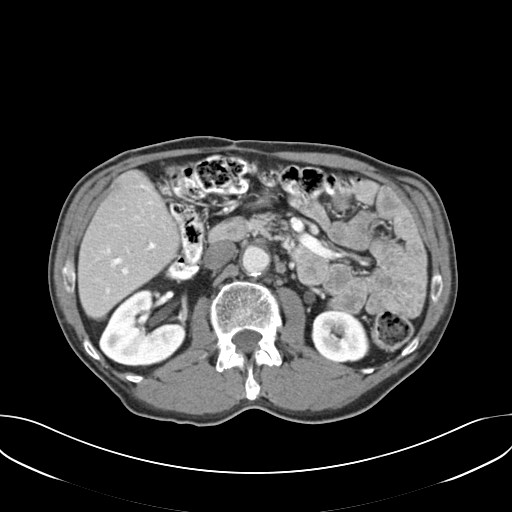
[im 70/132  soft-tissue]
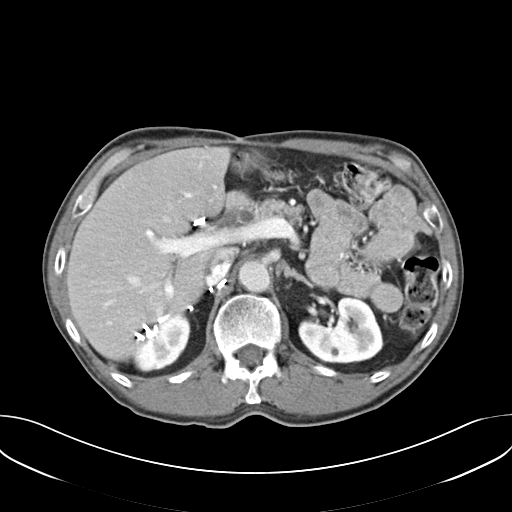
[im 85/132  soft-tissue]
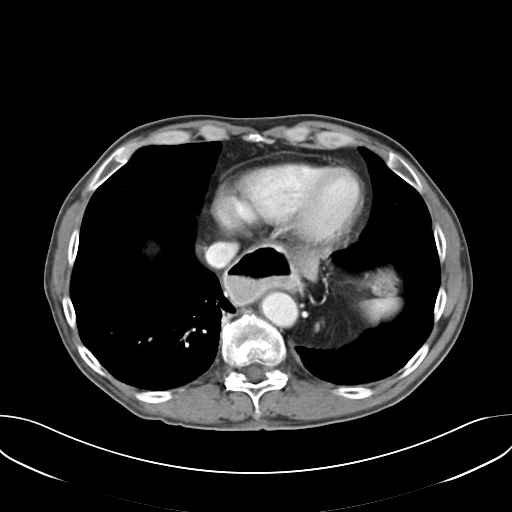
[im 93/132  soft-tissue]
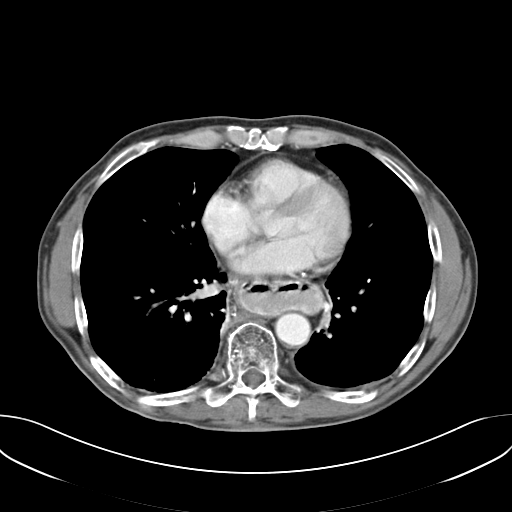
[im 93/132  bone]
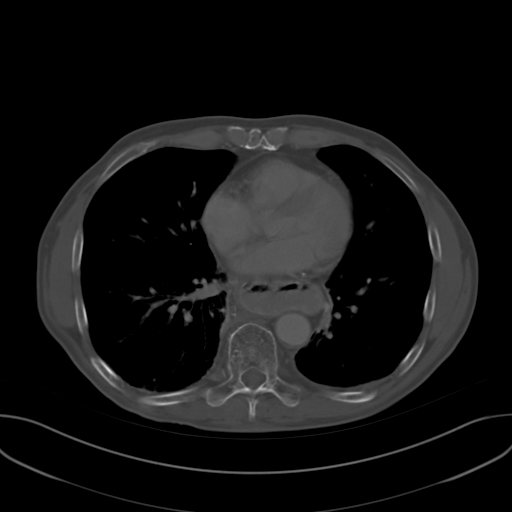
[im 101/132  soft-tissue]
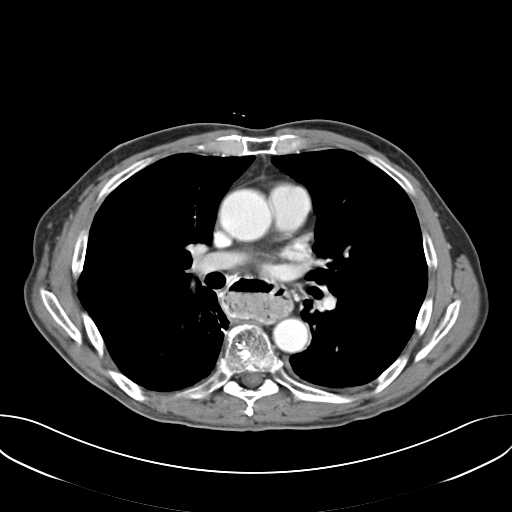
[im 101/132  lung]
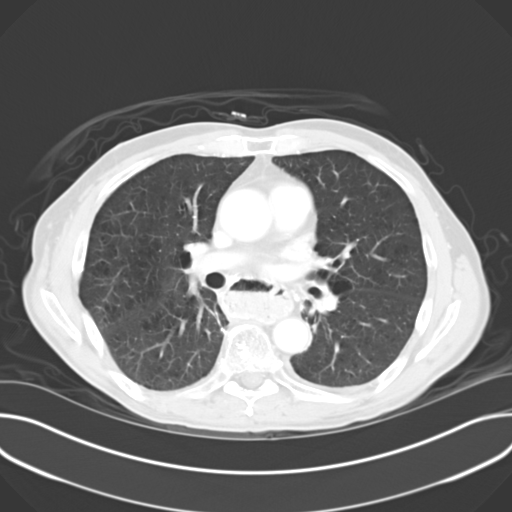
[im 108/132  lung]
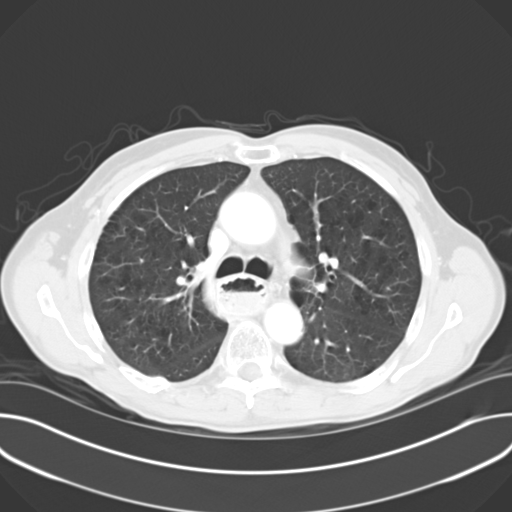
[im 116/132  soft-tissue]
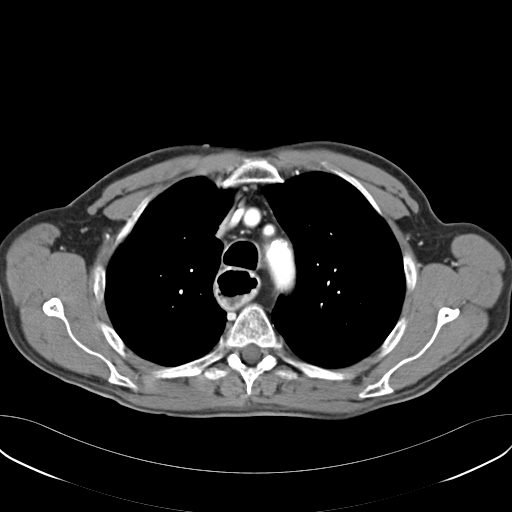
[im 116/132  lung]
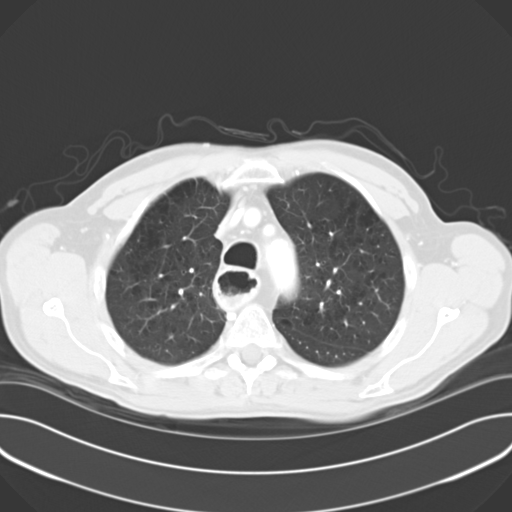
[im 124/132  soft-tissue]
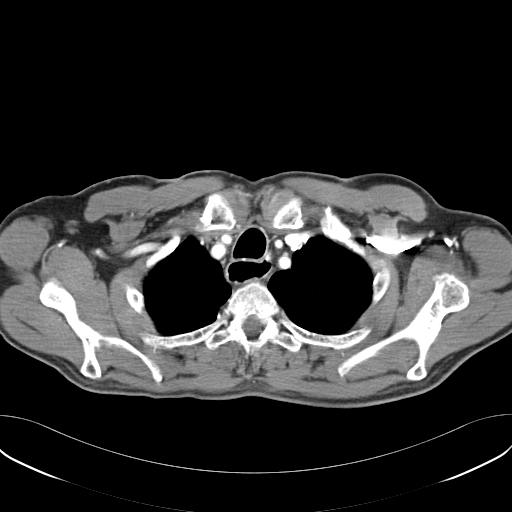
[im 124/132  lung]
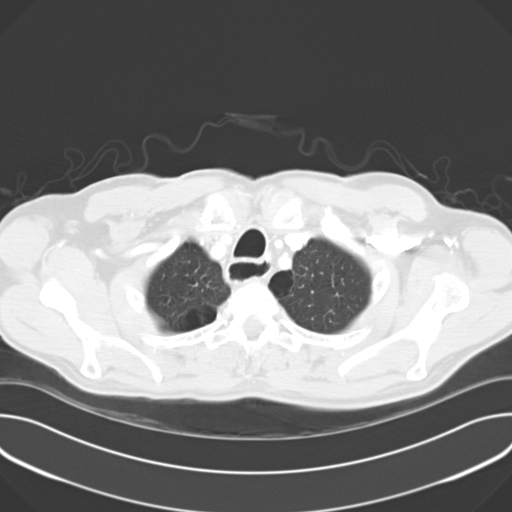

[13 of 32 positions shown; findings below may reference images not displayed]

CHEST:
 Postsurgical changes related to esophagectomy and gastric pull-through appear stable.  No enlarged mediastinal or hilar lymph nodes are present.  The previously demonstrated left pleural effusion has resolved.  There is no right pleural effusion or pericardial effusion.  The lungs appear stable with mild emphysema and scattered paraseptal blebs and scarring.  Today?s injection was via the left upper extremity, and the previous demonstrated collateral vessels within the right chest wall are much less apparent.
IMPRESSION: Stable postoperative CT of the chest demonstrating no evidence of local recurrence or metastatic disease.  The previously noted small left pleural effusion has resolved.  
 ABDOMEN:
 The liver has a stable appearance without evidence of metastatic disease.  The gallbladder is surgically absent.  Mild fusiform dilatation of the common bile duct and mild intrahepatic biliary dilatation appear stable.  There is no significant dilatation of the pancreatic duct or evidence of a pancreatic mass.  There are stable small renal cysts bilaterally.  The left adrenal gland appears normal.  The right adrenal gland has been previously resected.  There are no enlarged lymph nodes or evidence of omental disease.  Abdominal aortic aneurysm measuring 3.1 cm in diameter with associated mural thrombus appears stable.
IMPRESSION: Stable postoperative CT of the abdomen showing no evidence of metastatic disease.   There is stable biliary dilatation, likely related to previous cholecystectomy.  Aortic atherosclerosis and aneurysm appear stable from the prior examination.
 PELVIS:
 The prostate gland is mildly enlarged.  There is moderate distention of the urinary bladder.  No enlarged pelvic lymph nodes are demonstrated, and there is no ascites or omental disease.
IMPRESSION: Stable CT of the pelvis.  No evidence of metastatic disease.

## 2005-02-14 ENCOUNTER — Ambulatory Visit: Payer: Self-pay | Admitting: Hematology & Oncology

## 2005-02-16 ENCOUNTER — Ambulatory Visit (HOSPITAL_COMMUNITY): Admission: RE | Admit: 2005-02-16 | Discharge: 2005-02-16 | Payer: Self-pay | Admitting: Hematology & Oncology

## 2005-05-06 IMAGING — CT CT PELVIS W/ CM
1 of 3 series · 13 of 32 positions shown, 18 images · IV contrast (omnipaque)
Comparison: 10/27/04.

CLINICAL DATA: Follow-up esophageal ca; previous esophagectomy as well as chemotherapy and radiation therapy
TECHNIQUE: Multidetector CT imaging of the chest, abdomen, and pelvis was performed during administration of 125 cc Omnipaque 300 intravenous contrast.  Oral contrast was also administered.

[Series 2: cap 5.0 b40f st · axial · 0.68mm/px · z∈[-676,-96]mm · 13 of 132 slices shown, 18 images]
[im 8/132  soft-tissue]
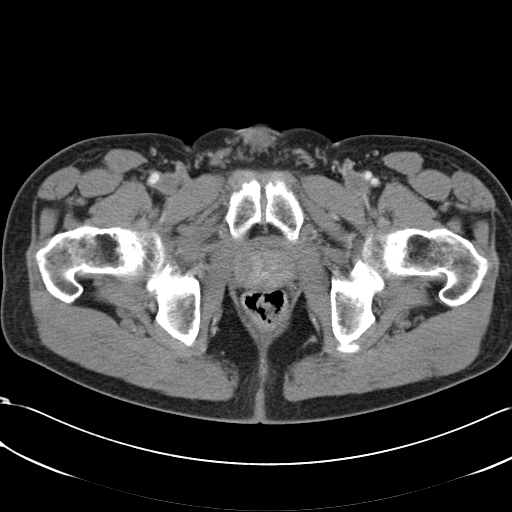
[im 8/132  bone]
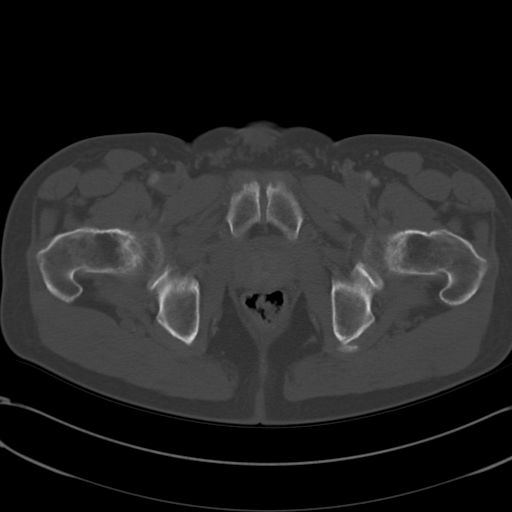
[im 22/132  soft-tissue]
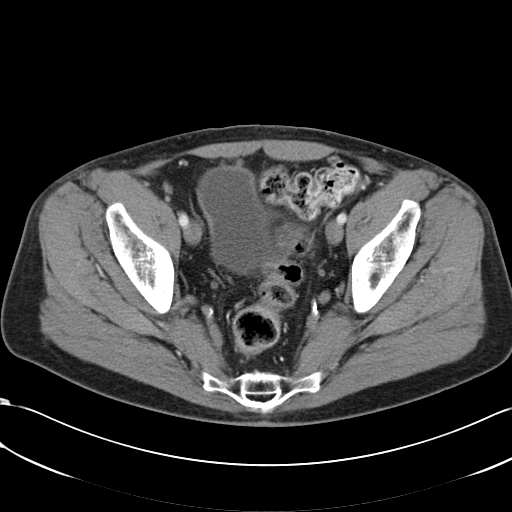
[im 30/132  soft-tissue]
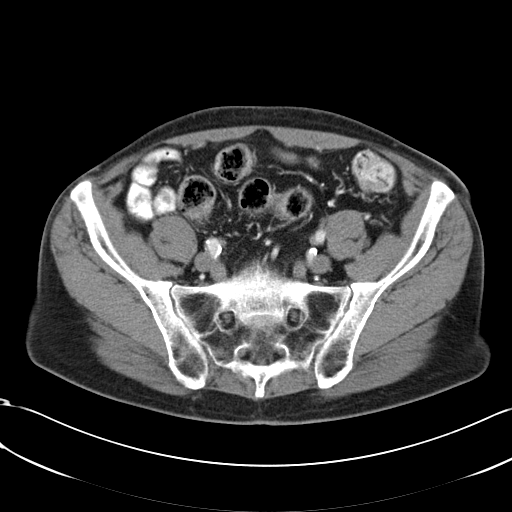
[im 37/132  soft-tissue]
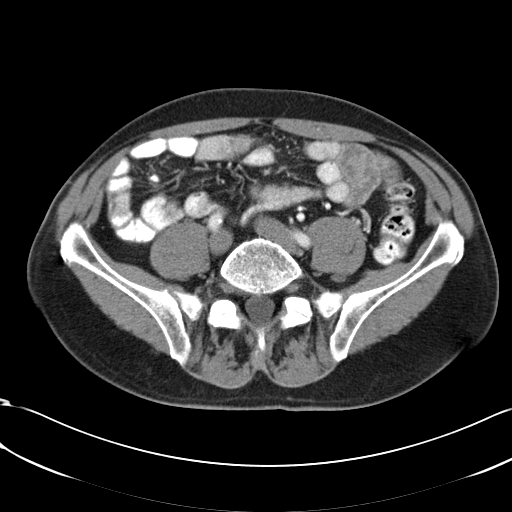
[im 51/132  soft-tissue]
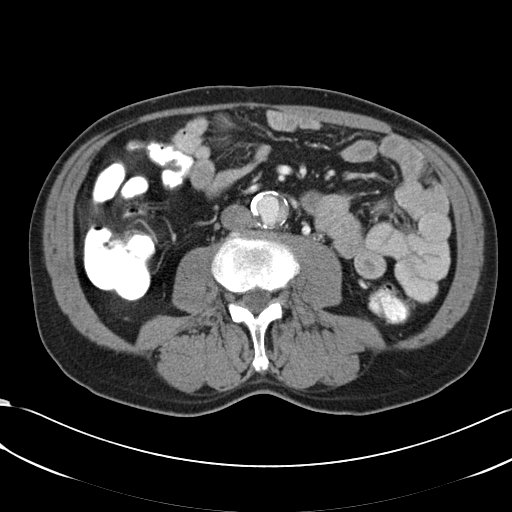
[im 59/132  soft-tissue]
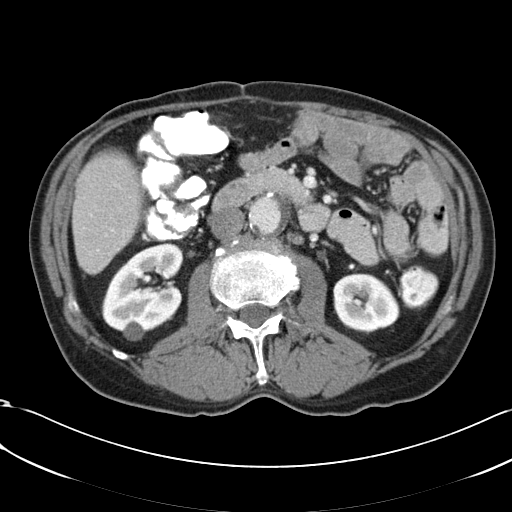
[im 73/132  soft-tissue]
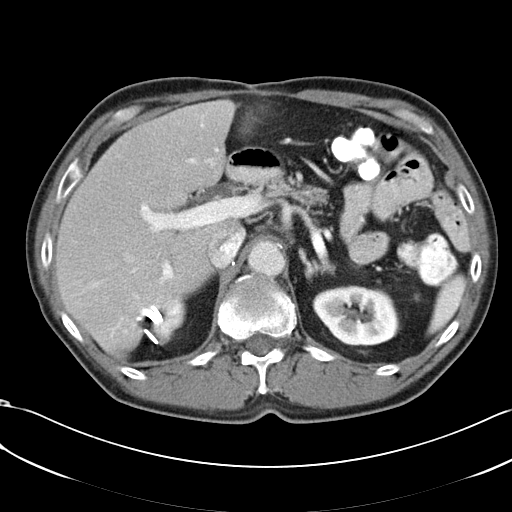
[im 81/132  soft-tissue]
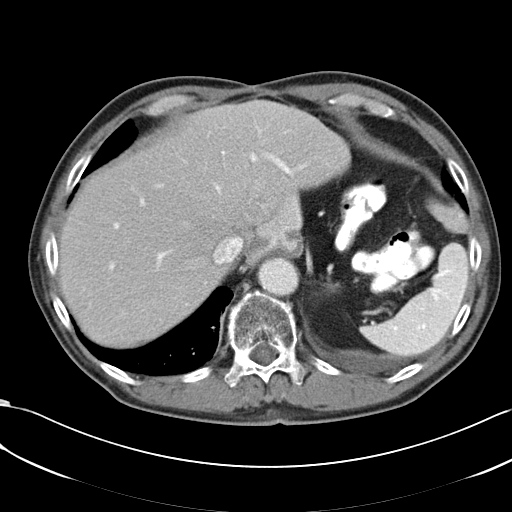
[im 95/132  soft-tissue]
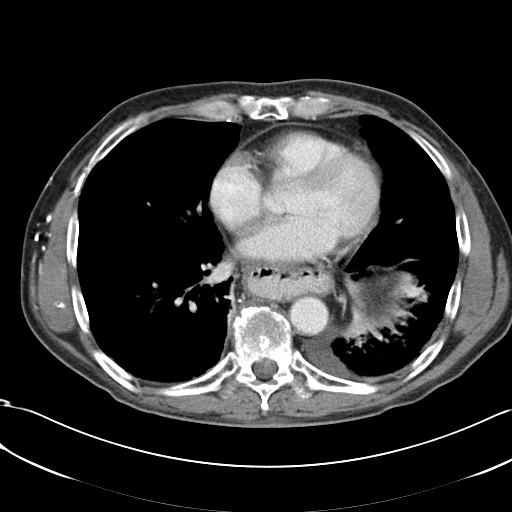
[im 95/132  bone]
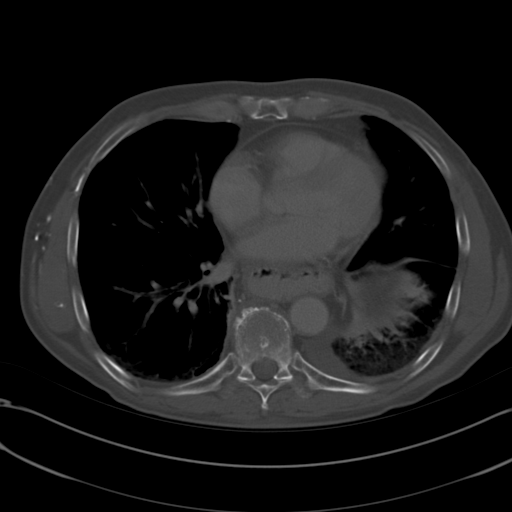
[im 102/132  soft-tissue]
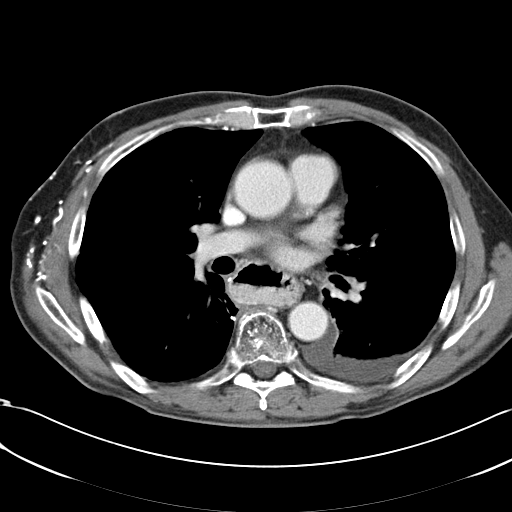
[im 102/132  lung]
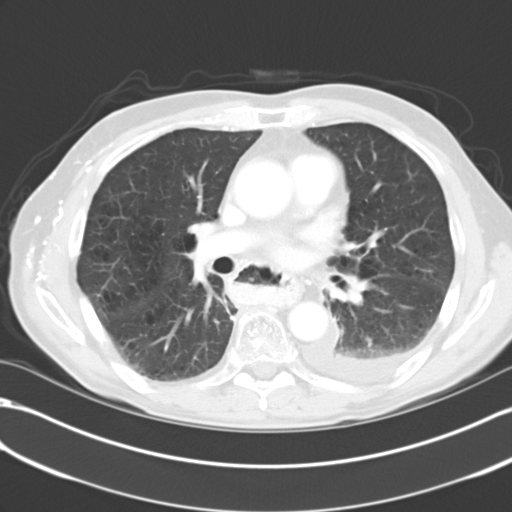
[im 110/132  soft-tissue]
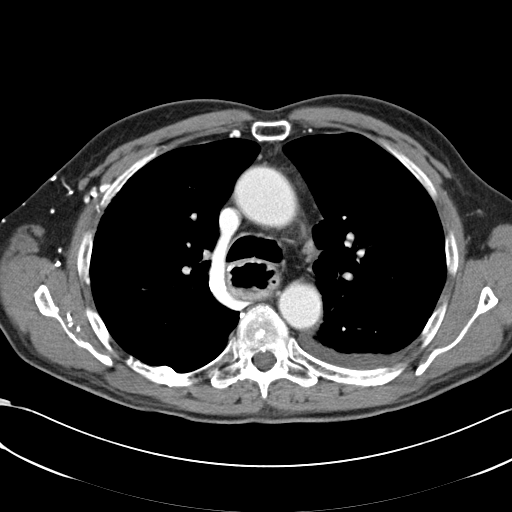
[im 110/132  lung]
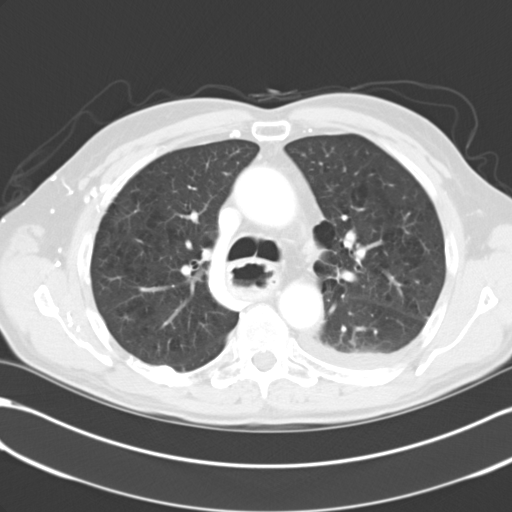
[im 117/132  lung]
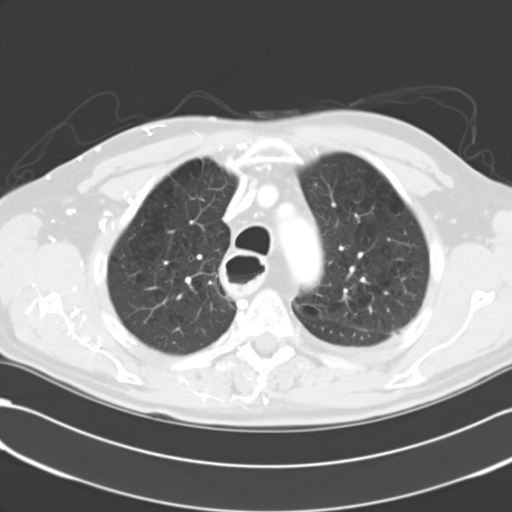
[im 124/132  soft-tissue]
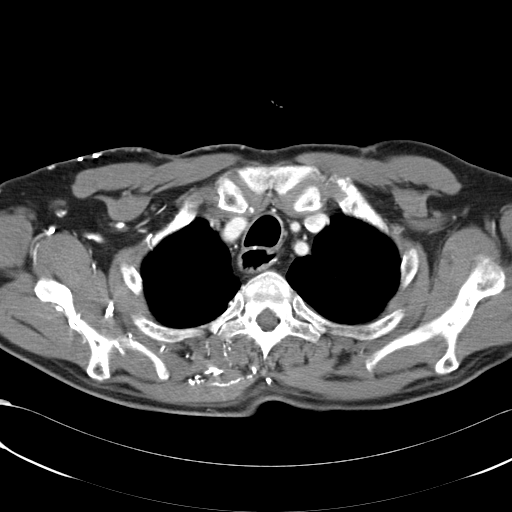
[im 124/132  lung]
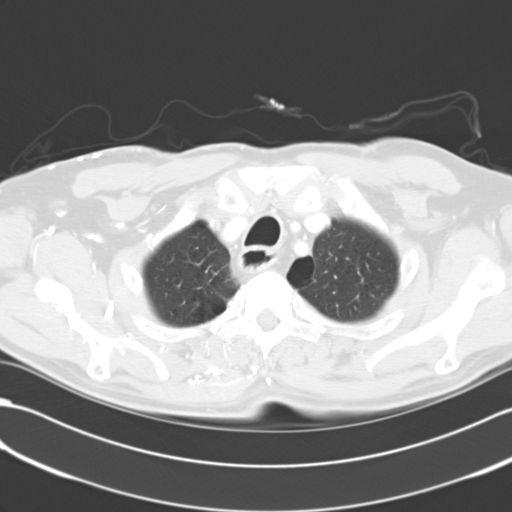

[13 of 32 positions shown; findings below may reference images not displayed]

CHEST CT WITH CONTRAST:
Post surgical changes are again seen from esophagectomy with gastric pull through.  A prevascular, mediastinal lymph node measuring 11 x 13 mm (image #14) remains stable.  No new or enlarging mediastinal or hilar lymph nodes are seen.  
A small left pleural effusion is seen which is new with increased left lower lobe atelectasis.  A new left posterior diaphragmatic hernia is seen with herniation of the splenic flexure.  There is no evidence of centrally obstructing mass or hilar adenopathy.  
Pulmonary emphysema is again noted.  There is no evidence of suspicious pulmonary nodules or masses.
IMPRESSION: 1.  Stable 1.1 x 1.3 cm superior mediastinal lymph node in the prevascular space.  No evidence of new or progressive metastatic disease.  
2.  New, small left pleural effusion and left lower lobe atelectasis.  New left posterior diaphragmatic hernia containing splenic flexure of colon. 
ABDOMEN CT WITH CONTRAST:
Multiple surgical clips are again seen along Morison?s pouch as well as surgical clips from prior cholecystectomy.  No liver lesions are seen and there is no evidence of biliary dilatation.  The spleen, pancreas, left adrenal gland, and left kidney are normal in appearance.  A small cyst is again seen in the right kidney which is stable.  There is no evidence of hydronephrosis. 
There is no evidence of retroperitoneal masses or adenopathy.  An infrarenal abdominal aortic aneurysm is seen measuring approximately 3.2 x 3.1 cm in greatest dimensions and this is not significantly changed since prior study.  There is no evidence of retroperitoneal hemorrhage.
IMPRESSION: 1.  No evidence of abdominal metastatic disease or other acute findings. 
2.  Stable infrarenal abdominal aortic aneurysm measuring 3.2 cm.  
PELVIS CT WITH CONTRAST:
There is no evidence of pelvic masses or adenopathy.  There is no evidence of inflammatory process or abnormal fluid collections.  No suspicious bone lesions are seen.
IMPRESSION: Unremarkable pelvis CT.  No evidence of metastatic disease or other acute findings.

## 2005-05-10 ENCOUNTER — Encounter: Admission: RE | Admit: 2005-05-10 | Discharge: 2005-05-10 | Payer: Self-pay | Admitting: Thoracic Surgery

## 2005-06-18 ENCOUNTER — Ambulatory Visit: Payer: Self-pay | Admitting: Hematology & Oncology

## 2005-06-19 ENCOUNTER — Ambulatory Visit (HOSPITAL_COMMUNITY): Admission: RE | Admit: 2005-06-19 | Discharge: 2005-06-19 | Payer: Self-pay | Admitting: Hematology & Oncology

## 2005-06-22 ENCOUNTER — Ambulatory Visit (HOSPITAL_COMMUNITY): Admission: RE | Admit: 2005-06-22 | Discharge: 2005-06-22 | Payer: Self-pay | Admitting: Hematology & Oncology

## 2005-07-28 IMAGING — CR DG CHEST 2V
2 series · 2 of 2 positions shown · non-contrast
Comparison: none

CLINICAL DATA: Esophageal carcinoma.
 PA AND LATERAL CHEST:
 Correlation is made with a CT of the chest done 02/16/05 and chest radiographs done 04/09/04.  
 Left lower lobe pulmonary opacity and left pleural effusion have improved from the CT scan.  The cardiomediastinal silhouette appear stable with stable surgical clips in the posterior mediastinum.  There is a stable right thoracotomy defect.  Multiple surgical clips remain in the upper abdomen.

[w chest pa]
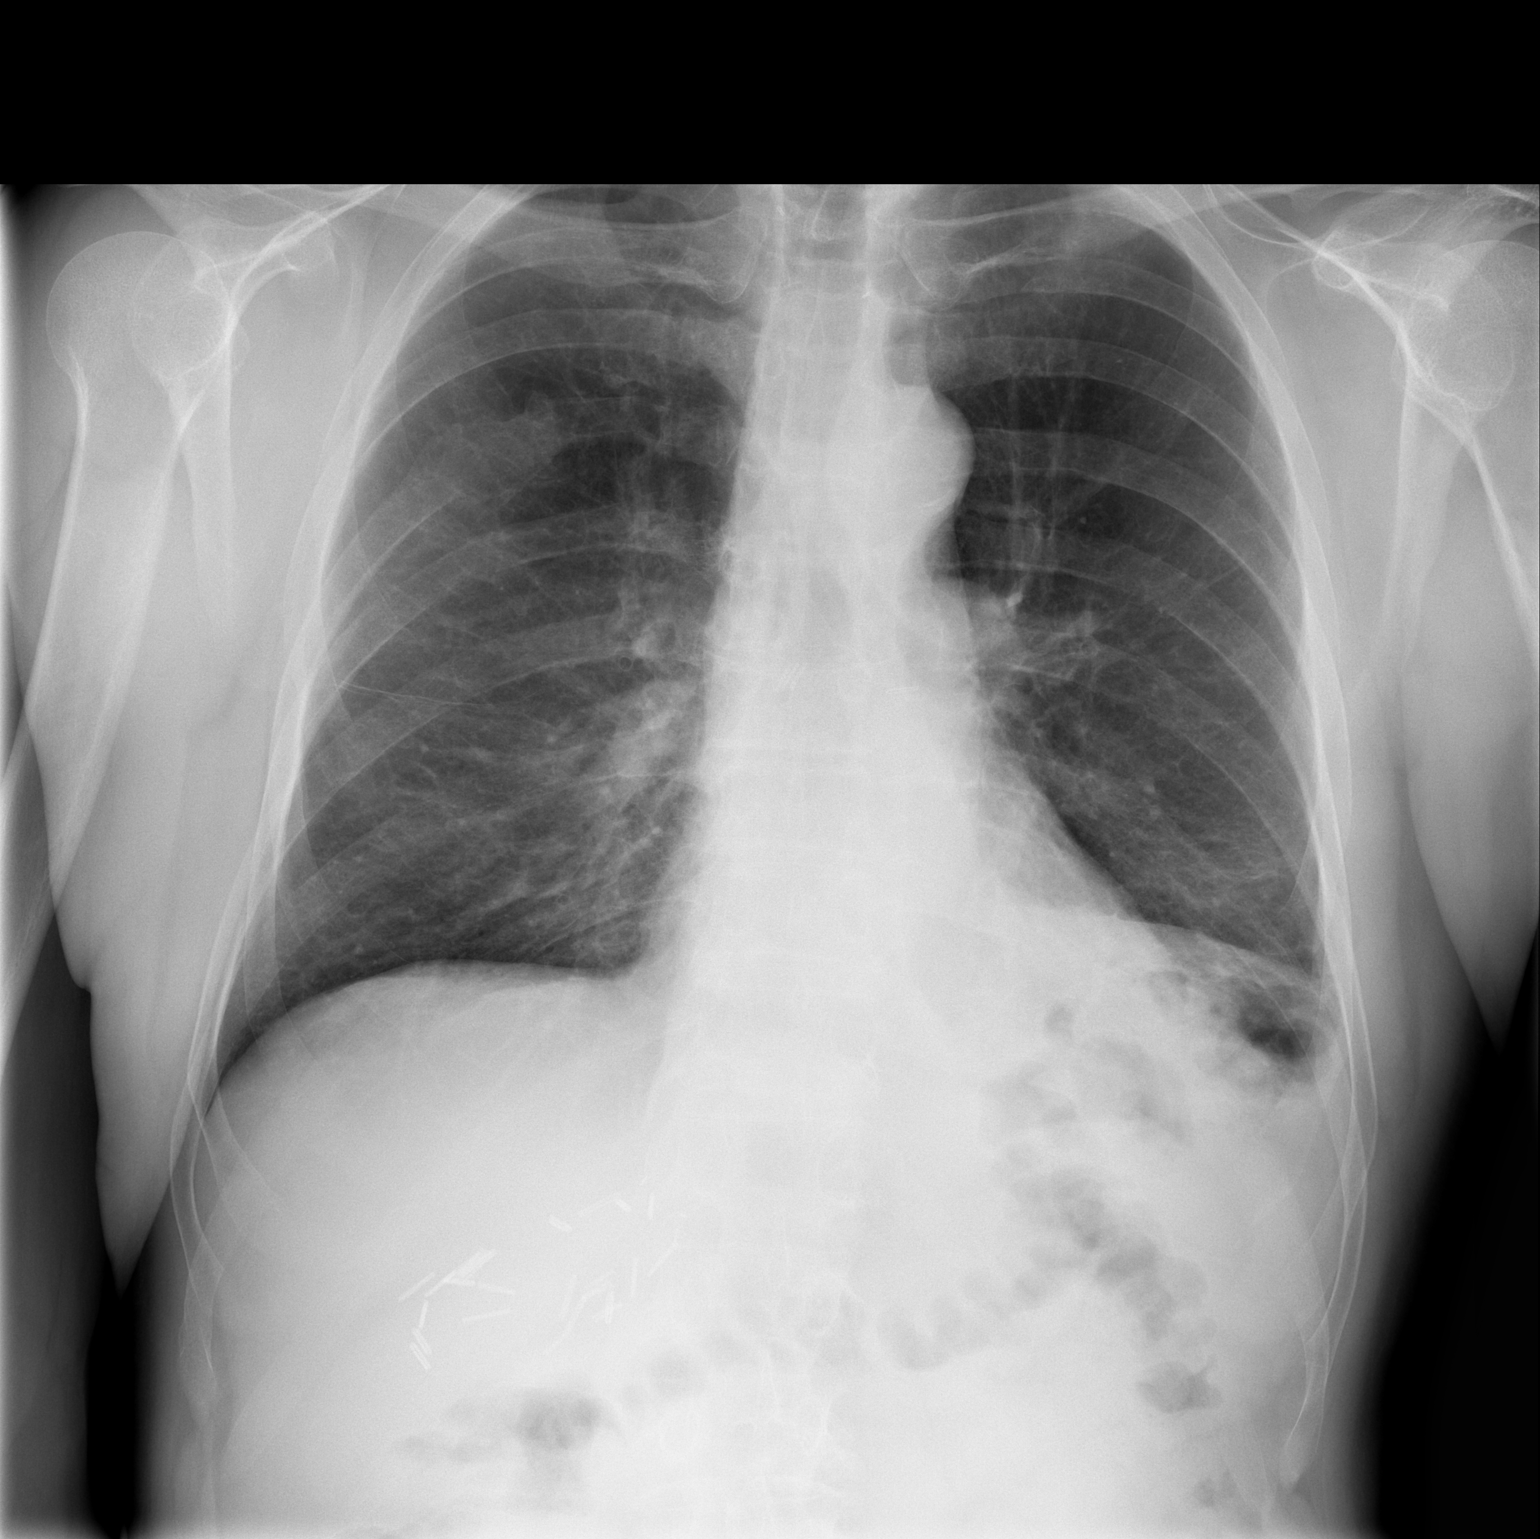

[w chest lat]
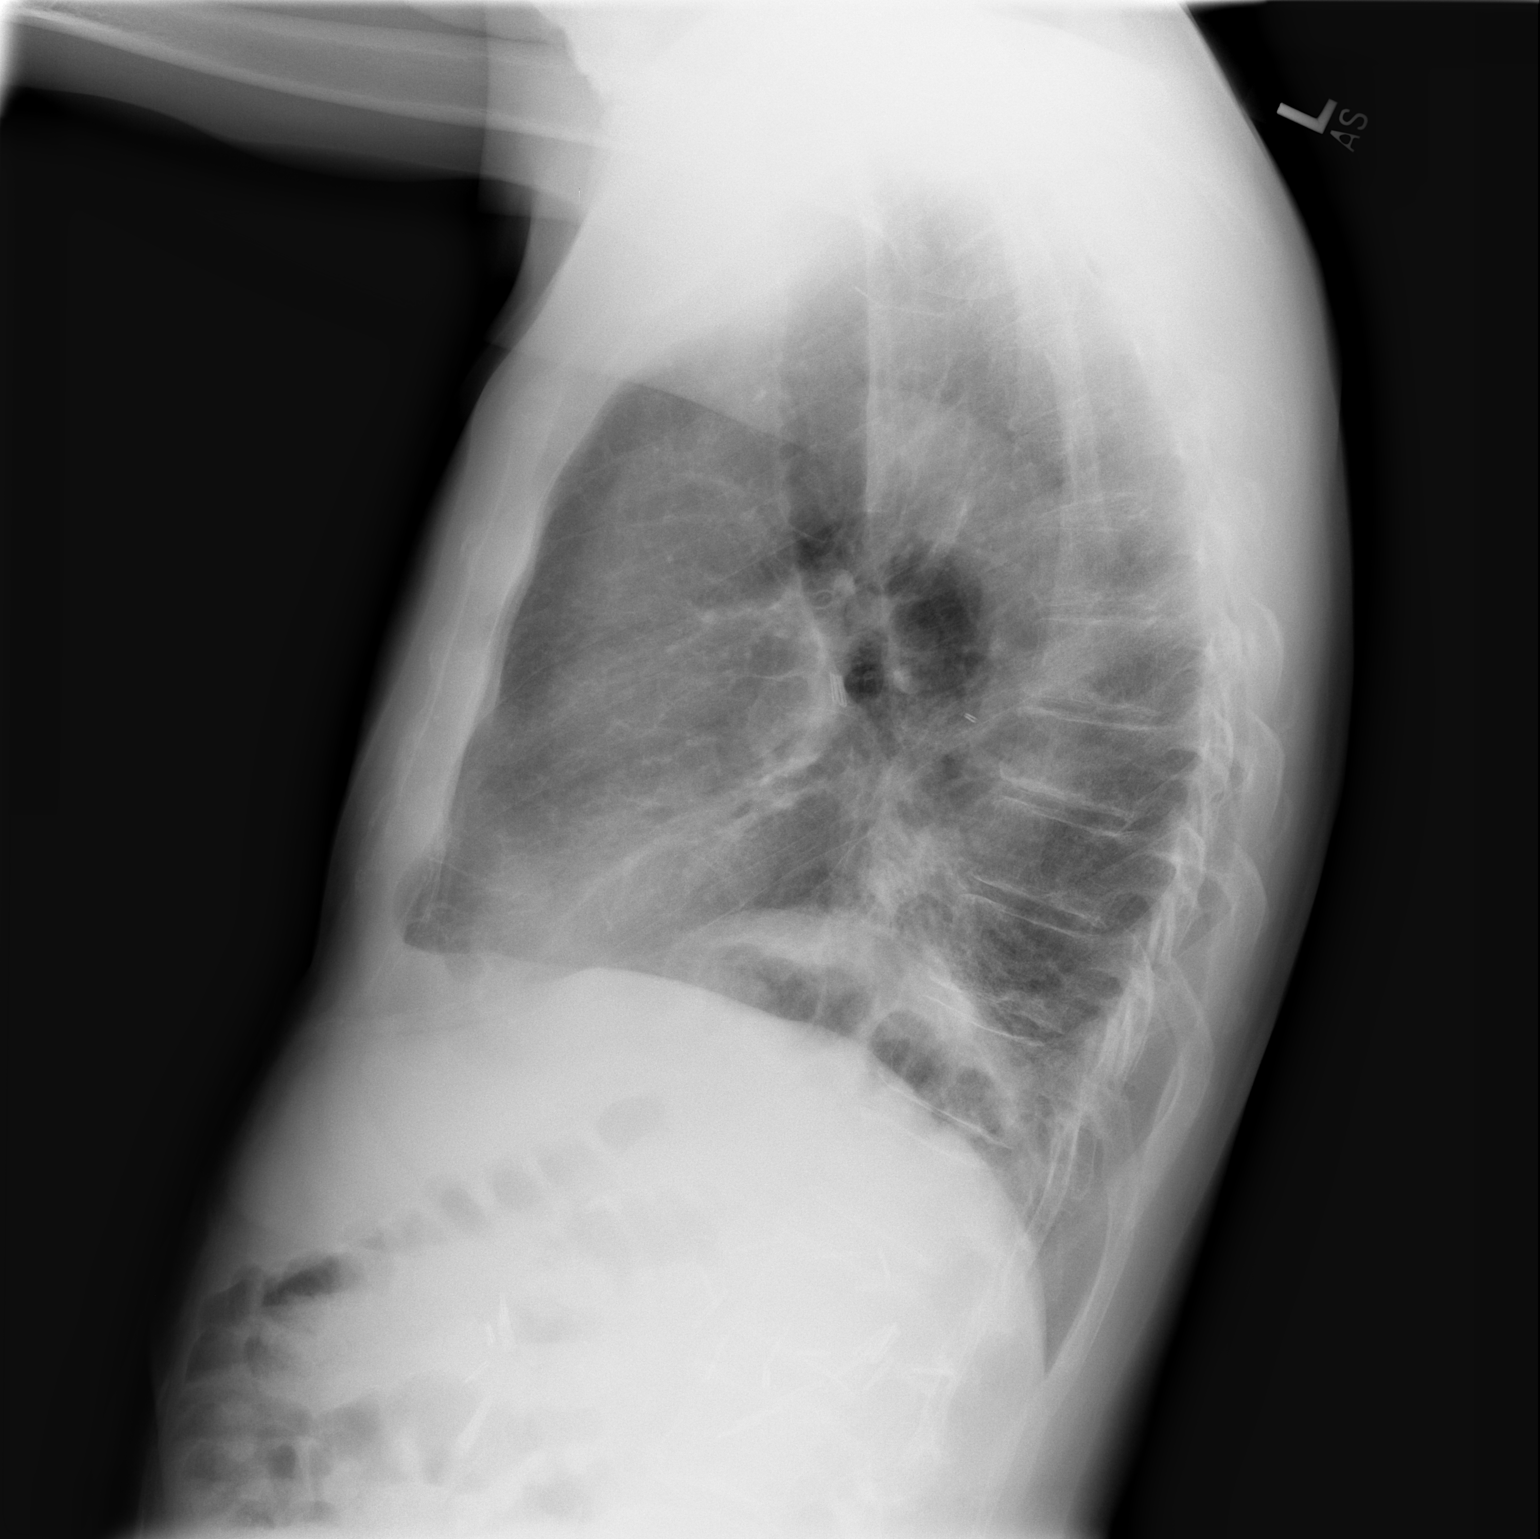

[2 of 2 positions shown; findings below may reference images not displayed]

IMPRESSION: 1.  Interval improvement in left lower lobe airspace disease and left pleural effusion from CT done three months ago.
 2.  No acute chest findings.

## 2005-09-09 IMAGING — CT CT CHEST W/ CM
1 of 3 series · 14 of 32 positions shown, 19 images · non-contrast
Comparison: 02/16/2005

CLINICAL DATA: Esophageal cancer. Esophagectomy with gastric pull-through
procedure. Status post chemotherapy and radiation.
TECHNIQUE: Multidetector CT imaging of the chest, abdomen and pelvis was
performed following the standard protocol during bolus administration of
intravenous contrast.

[Series 2: cap w/iv 5.0 b30f · axial · 0.70mm/px · z∈[-681,-91]mm · 14 of 134 slices shown, 19 images]
[im 8/134  soft-tissue]
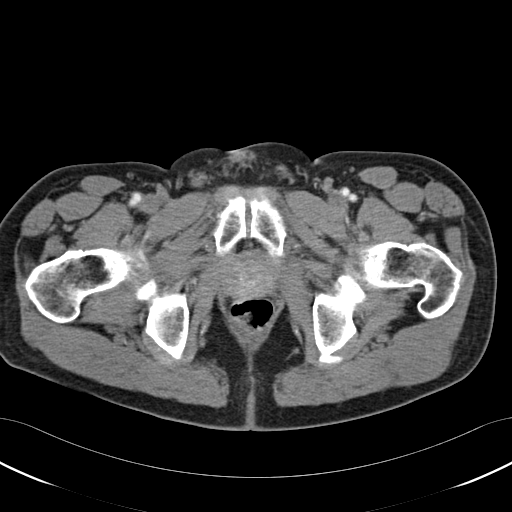
[im 8/134  bone]
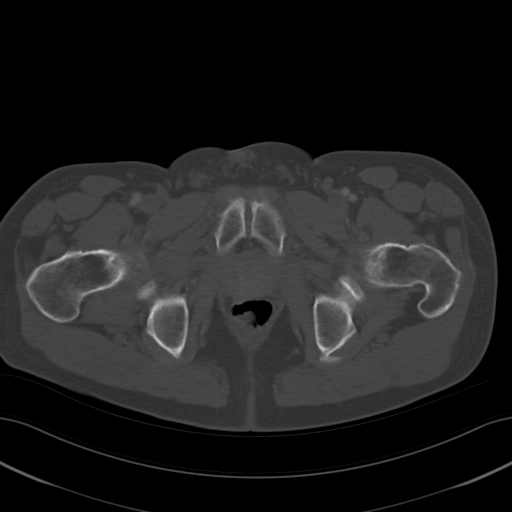
[im 15/134  soft-tissue]
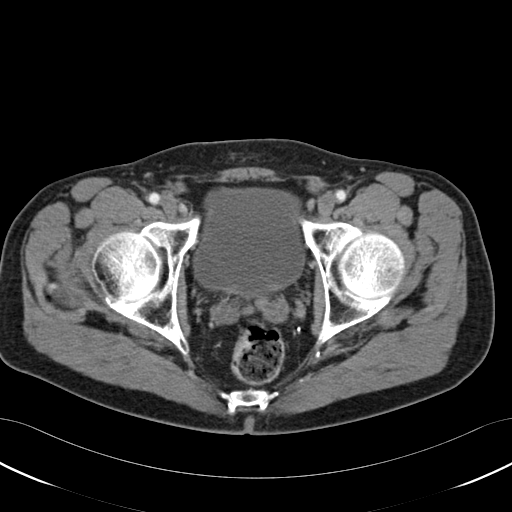
[im 30/134  soft-tissue]
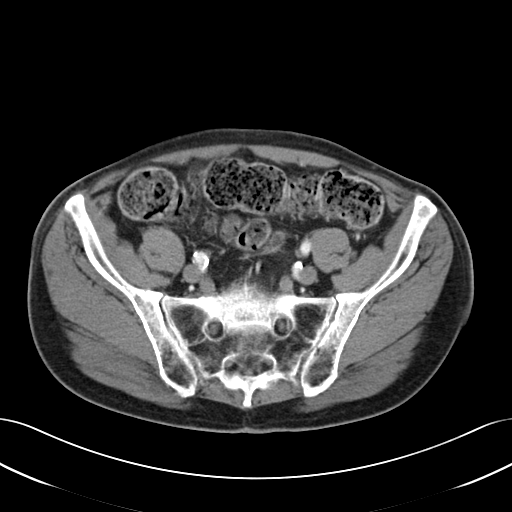
[im 37/134  soft-tissue]
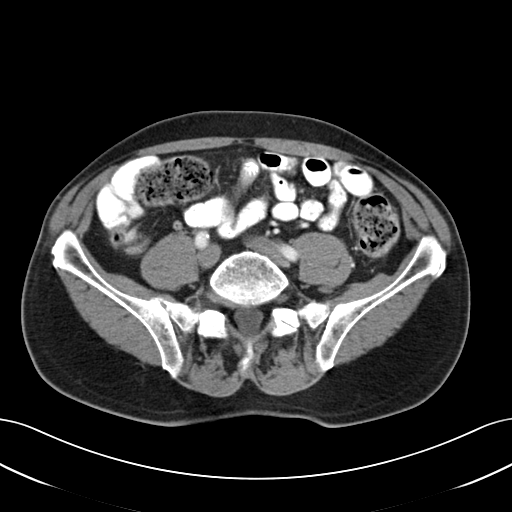
[im 45/134  soft-tissue]
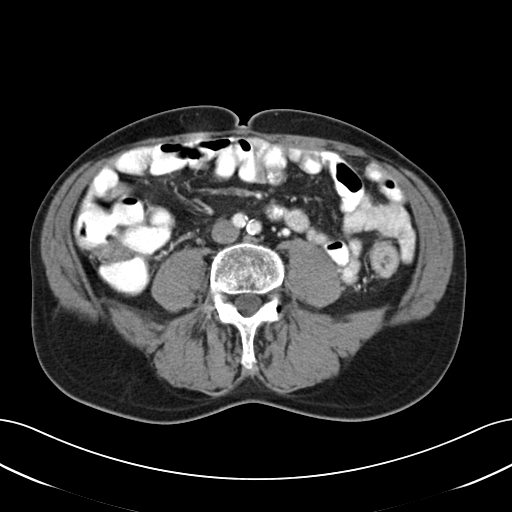
[im 60/134  soft-tissue]
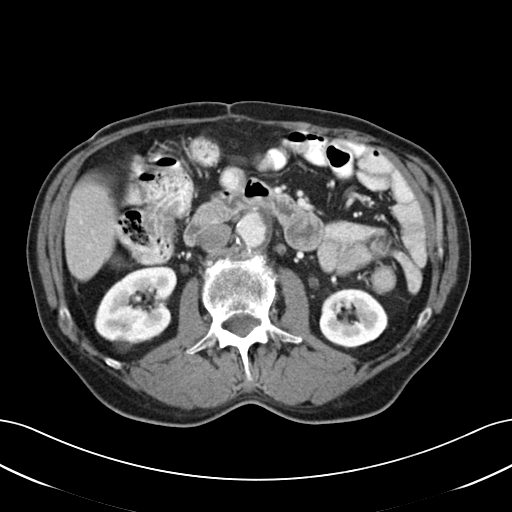
[im 67/134  soft-tissue]
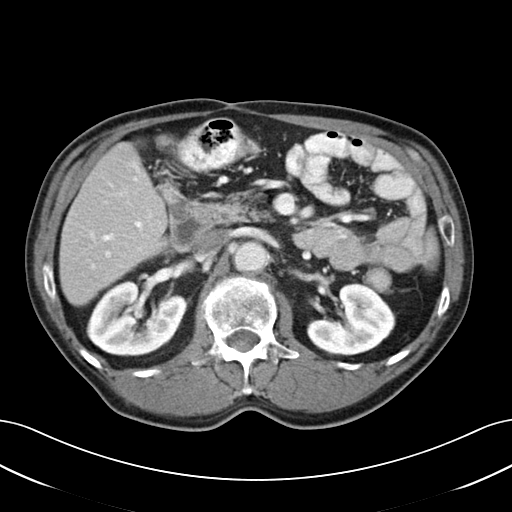
[im 74/134  soft-tissue]
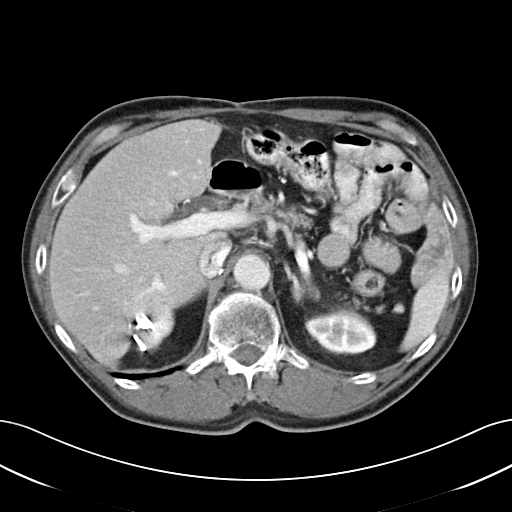
[im 89/134  soft-tissue]
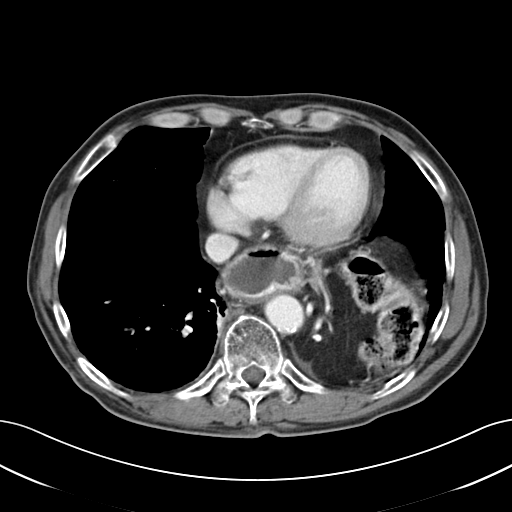
[im 89/134  bone]
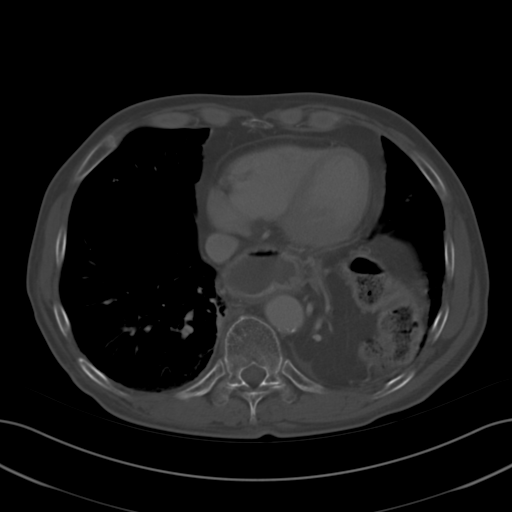
[im 97/134  soft-tissue]
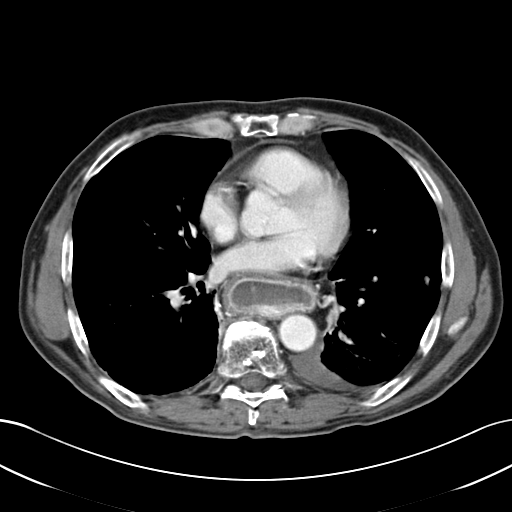
[im 104/134  soft-tissue]
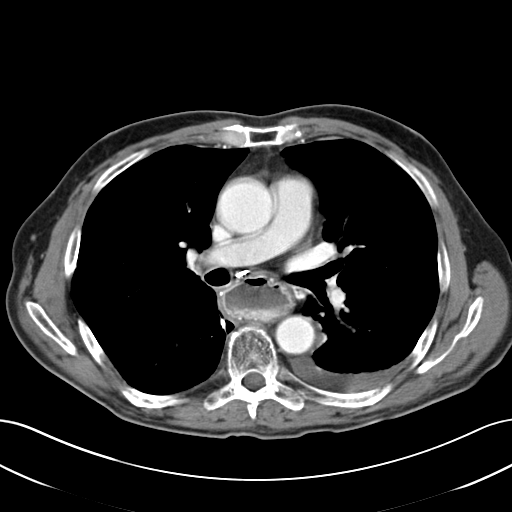
[im 104/134  lung]
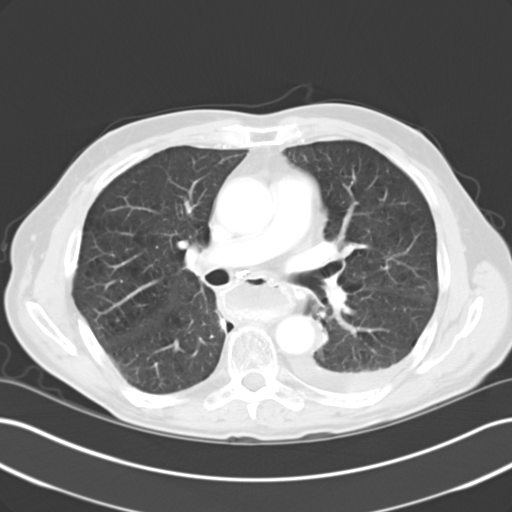
[im 111/134  lung]
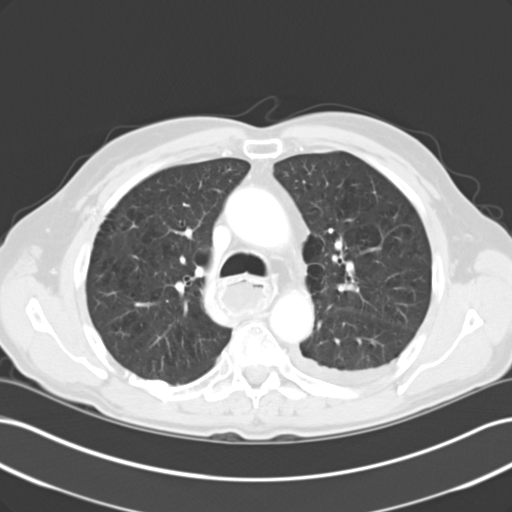
[im 119/134  soft-tissue]
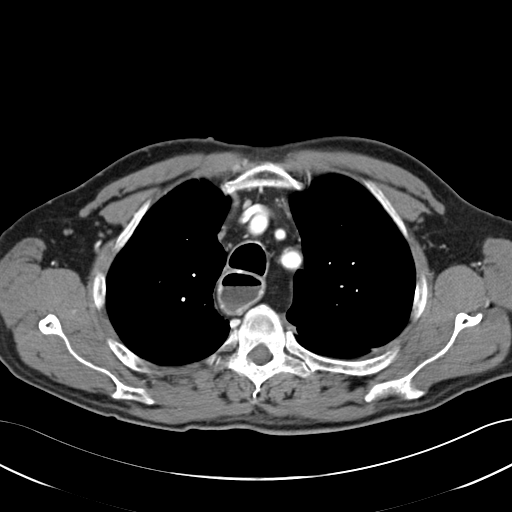
[im 119/134  lung]
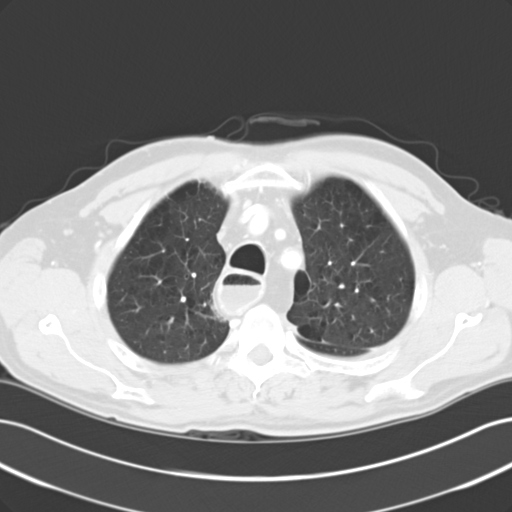
[im 126/134  soft-tissue]
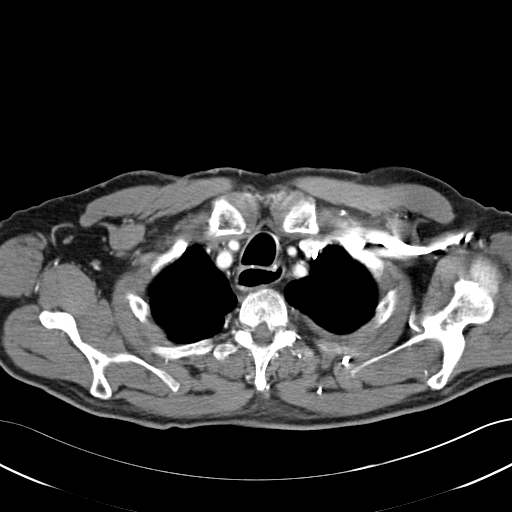
[im 126/134  lung]
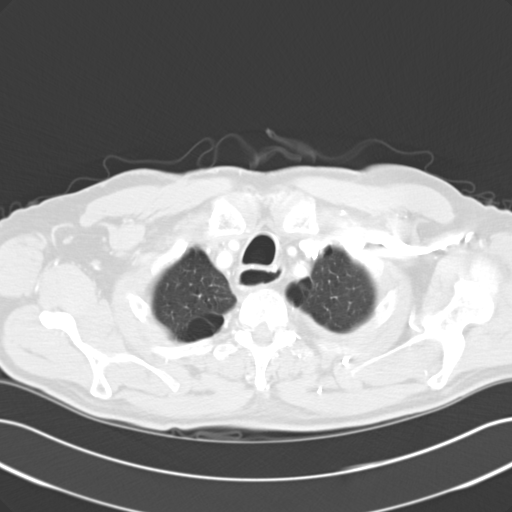

[14 of 32 positions shown; findings below may reference images not displayed]

Contrast:  150 cc Omnipaque 300

CHEST CT WITH CONTRAST:

No axillary lymphadenopathy. The small prevascular lymph noted seen on the
previous study is unchanged, measuring 10 x 10 mm today. No other mediastinal or
hilar lymphadenopathy. There is no evidence for abnormal soft tissue attenuation
adjacent to the gastric pull-through. Heart size is normal. Small left pleural
effusion persist.

Emphysema noted in the upper lobes bilaterally. Chronic atelectasis and/or
scarring in the left base is unchanged. Stable appearance of the left
diaphragmatic defect.
IMPRESSION: Stable exam. There is no evidence for local recurrence or metastatic
involvement.

ABDOMEN CT WITH CONTRAST:

The liver, spleen, duodenum, pancreas, and left adrenal gland have normal
features. subcentimeter cyst in the right kidney is stable with other tiny low
density renal lesions, too small to characterize. Patient is apparently status
post right adrenalectomy. No evidence for mesenteric adenopathy. Small left
paraaortic lymph node seen on image 72 is unchanged. No change in the 3.2 cm
abdominal aortic aneurysm.
IMPRESSION: Stable exam. No evidence for metastatic involvement.

Stable abdominal aortic aneurysm.

PELVIS CT WITH CONTRAST:

No free fluid or lymphadenopathy. Prostate gland is mildly large.
IMPRESSION: Stable . No evidence for metastatic disease.

## 2005-11-05 ENCOUNTER — Ambulatory Visit: Payer: Self-pay | Admitting: Hematology & Oncology

## 2005-11-06 ENCOUNTER — Ambulatory Visit (HOSPITAL_COMMUNITY): Admission: RE | Admit: 2005-11-06 | Discharge: 2005-11-06 | Payer: Self-pay | Admitting: Hematology & Oncology

## 2005-11-07 ENCOUNTER — Encounter: Admission: RE | Admit: 2005-11-07 | Discharge: 2005-11-07 | Payer: Self-pay | Admitting: Thoracic Surgery

## 2005-11-13 ENCOUNTER — Ambulatory Visit (HOSPITAL_COMMUNITY): Admission: RE | Admit: 2005-11-13 | Discharge: 2005-11-13 | Payer: Self-pay | Admitting: Hematology & Oncology

## 2006-01-25 IMAGING — CR DG CHEST 2V
2 series · 2 of 2 positions shown · non-contrast
Comparison: 05/10/05.

CLINICAL DATA: Esophageal cancer, routine follow up.
 CHEST, TWO VIEWS:

[view not recorded (1 of 2)]
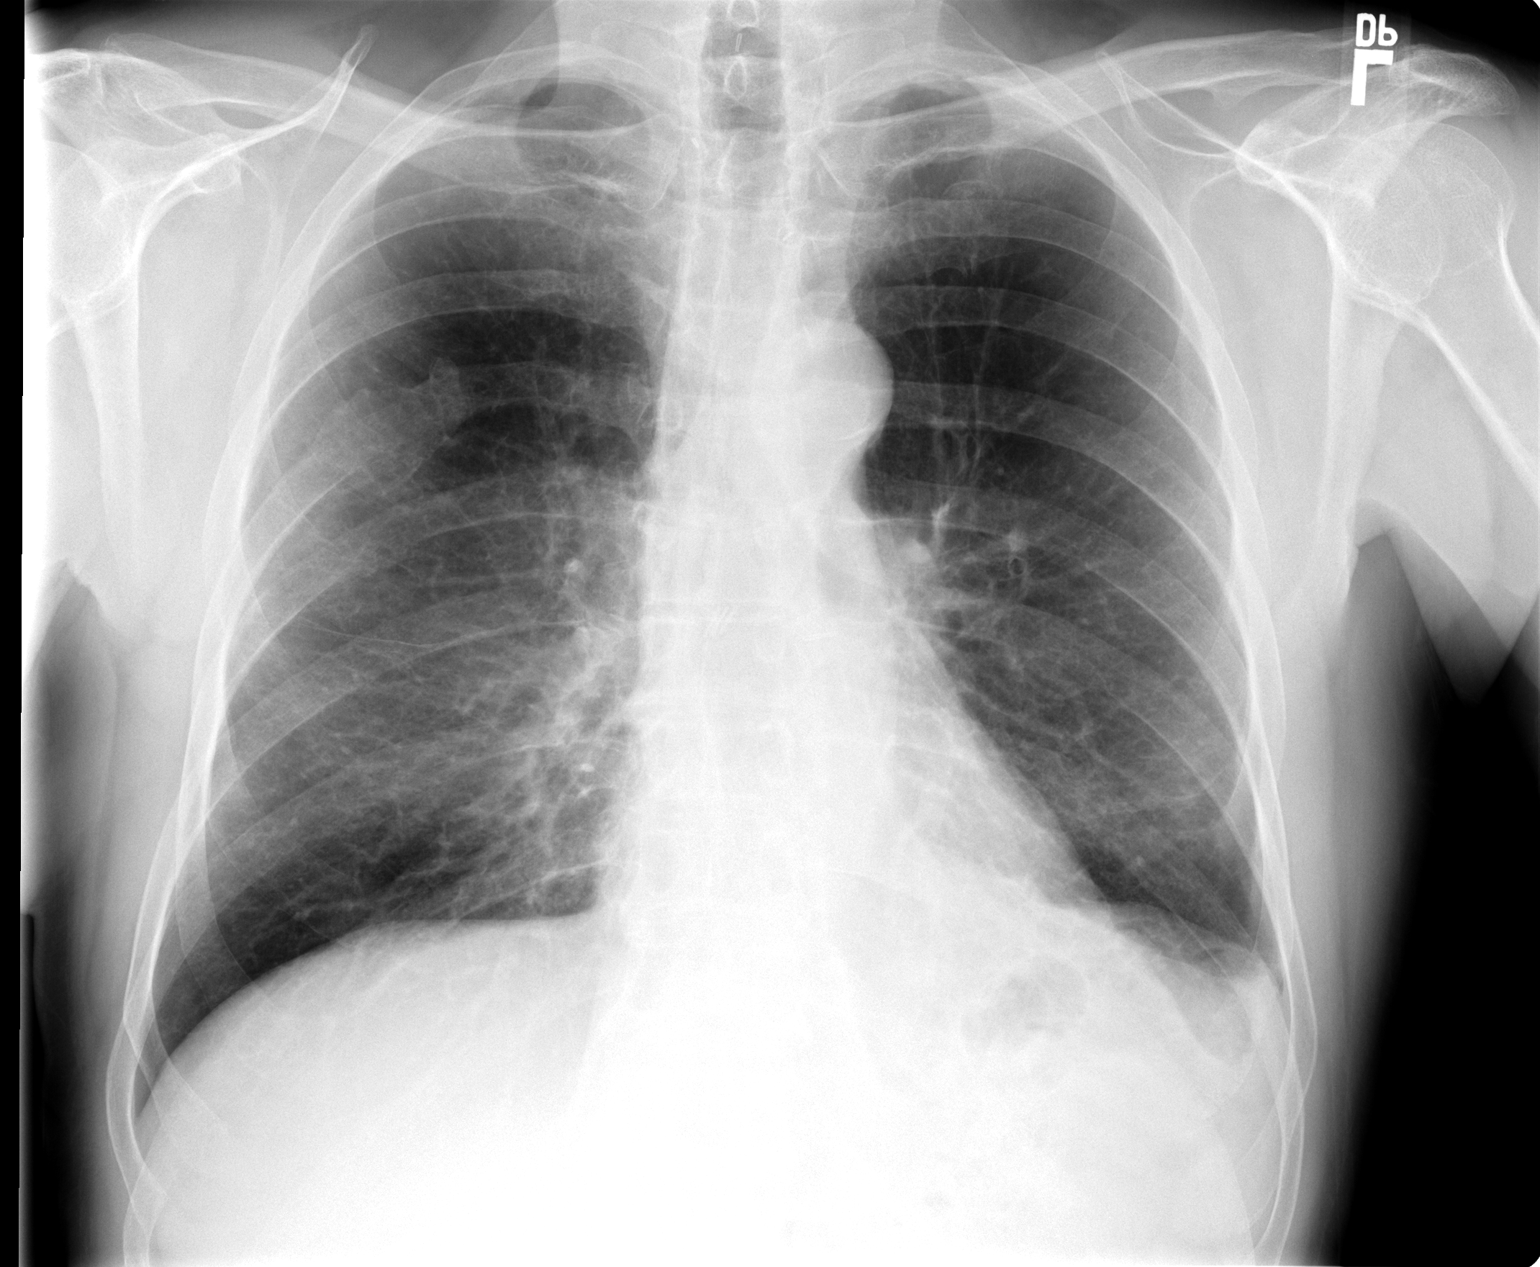

[view not recorded (2 of 2)]
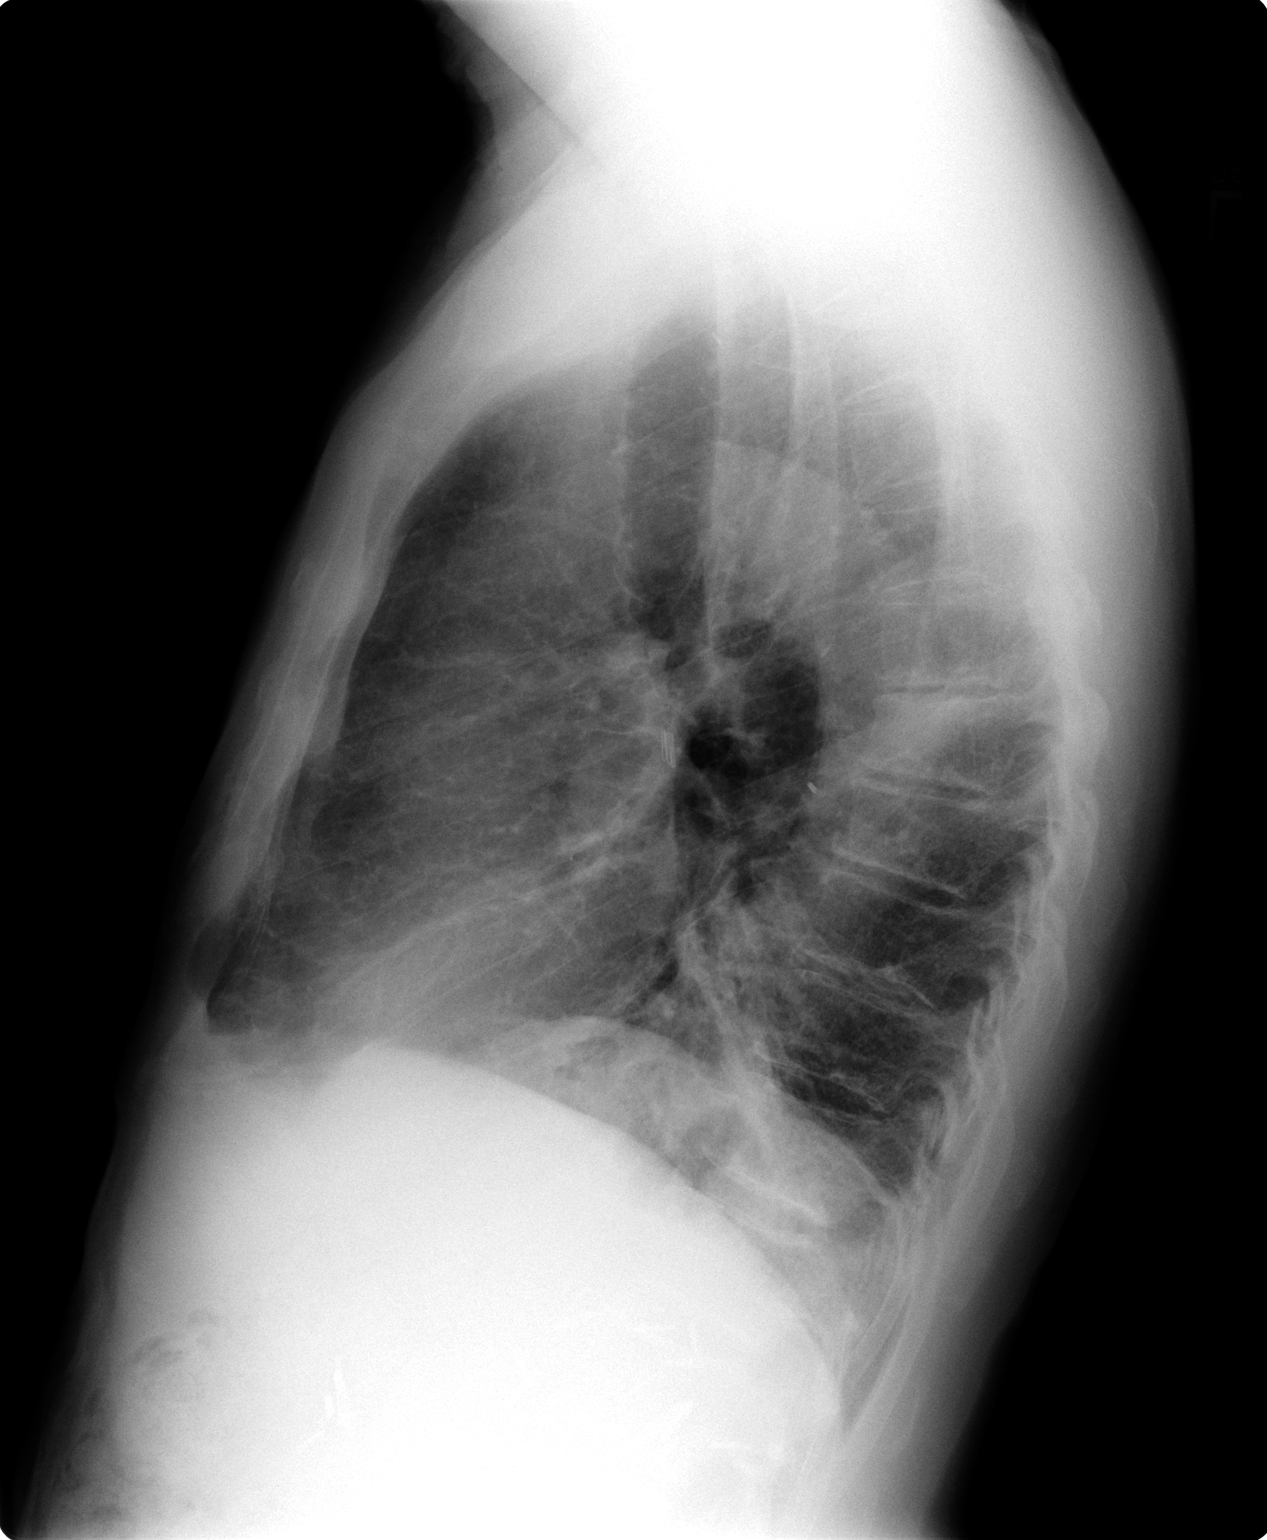

[2 of 2 positions shown; findings below may reference images not displayed]

FINDINGS: Heart and vascularity are normal.  There is an abnormal density at the left lung base which is largely unchanged.  This is likely chronic.  Post thoracotomy or healing fractures of the right sixth and seventh ribs.  No change.  Surgical clips in the middle mediastinum, as before.
IMPRESSION: COPD with abnormal left lower lobe density ? no change since 05/10/05.  Likely chronic disease.

## 2006-01-31 IMAGING — CT CT PELVIS W/ CM
1 of 3 series · 13 of 32 positions shown, 18 images · IV contrast (omnipaque)
Comparison: 06/22/05.

CLINICAL DATA: 64 year old with esophageal cancer.  Status post gastric pull-through procedure.
 CHEST CT WITH CONTRAST:
TECHNIQUE: Multidetector CT imaging of the chest was performed following the standard protocol during bolus administration of intravenous contrast.   
 Contrast:  125 cc Omnipaque 300 IV.
TECHNIQUE: Multidetector CT imaging of the abdomen was performed following the standard protocol during bolus administration of intravenous contrast.
TECHNIQUE: Multidetector CT imaging of the pelvis was performed following the standard protocol during bolus administration of intravenous contrast.

[Series 2: cap w/iv 5.0 b30f · axial · 0.74mm/px · z∈[-700,-105]mm · 13 of 135 slices shown, 18 images]
[im 8/135  soft-tissue]
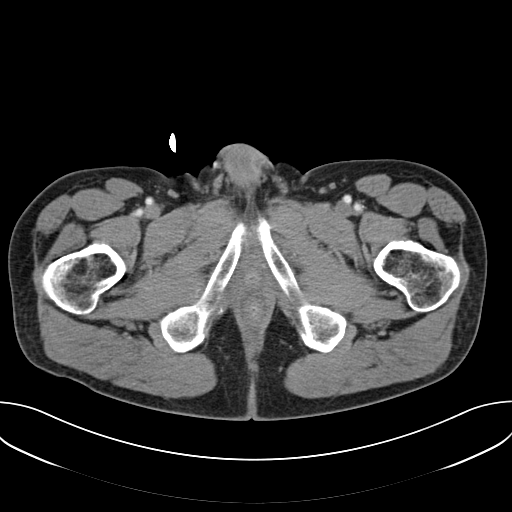
[im 8/135  bone]
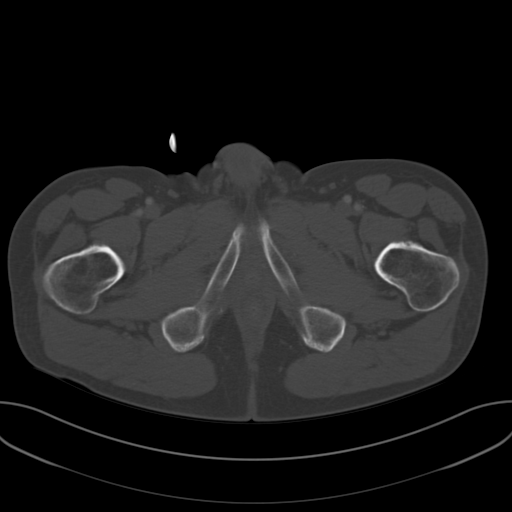
[im 22/135  soft-tissue]
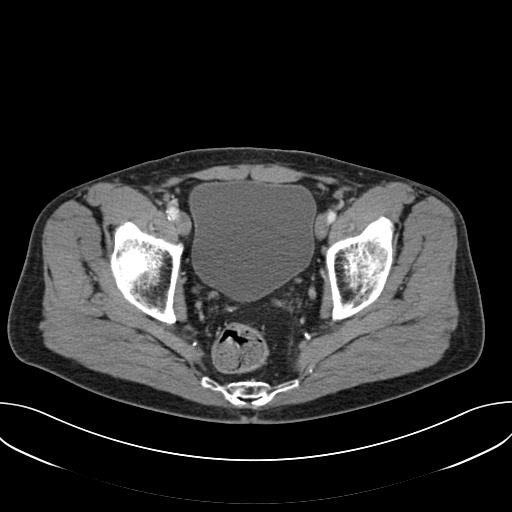
[im 29/135  soft-tissue]
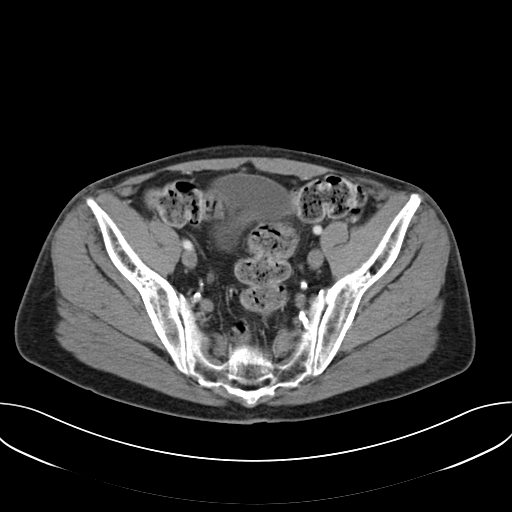
[im 43/135  soft-tissue]
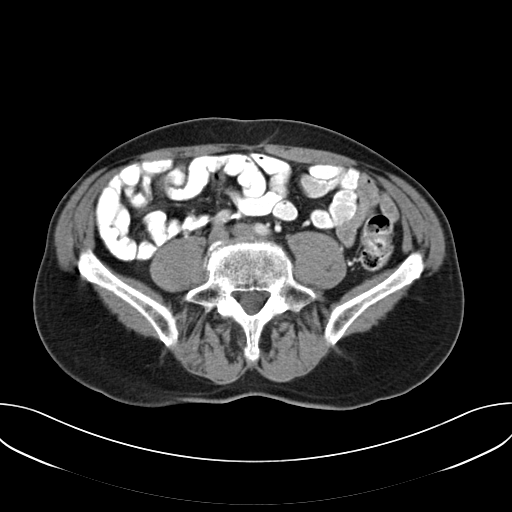
[im 50/135  soft-tissue]
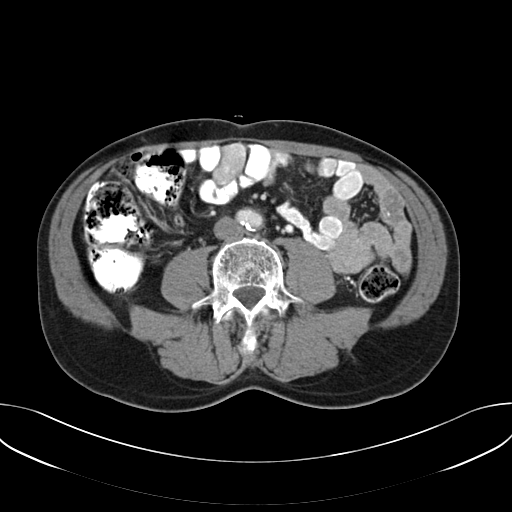
[im 64/135  soft-tissue]
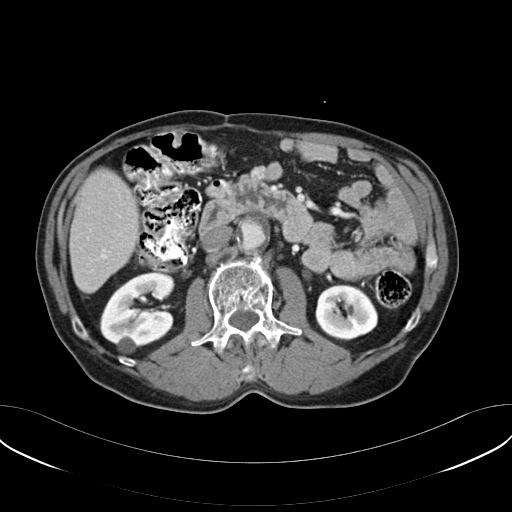
[im 71/135  soft-tissue]
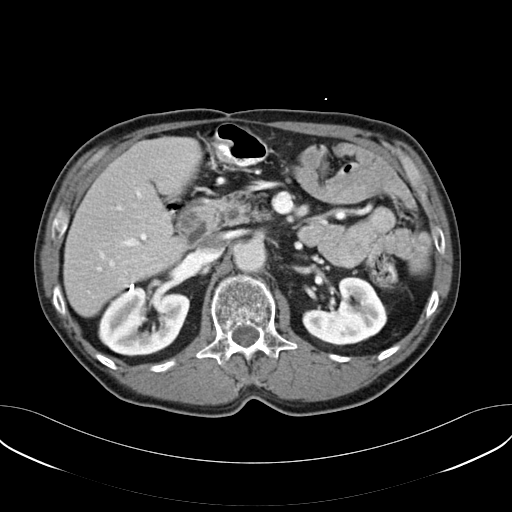
[im 85/135  soft-tissue]
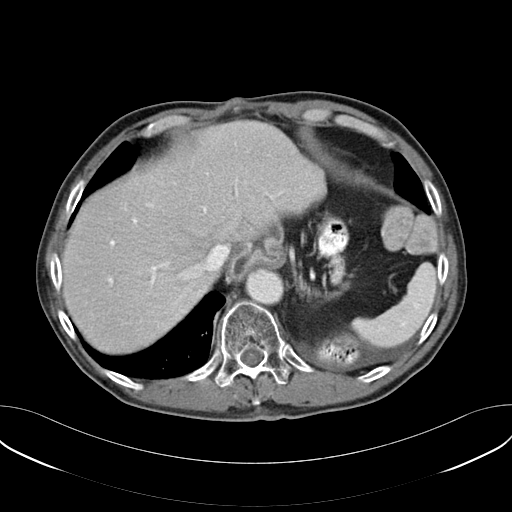
[im 92/135  soft-tissue]
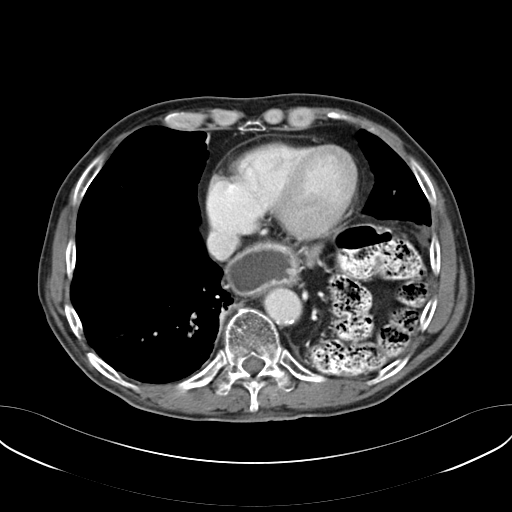
[im 92/135  bone]
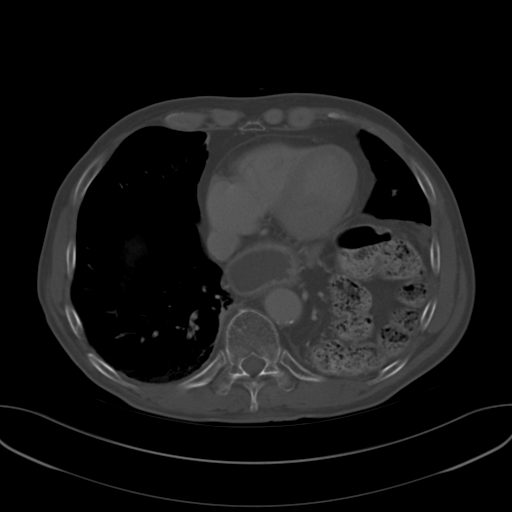
[im 106/135  soft-tissue]
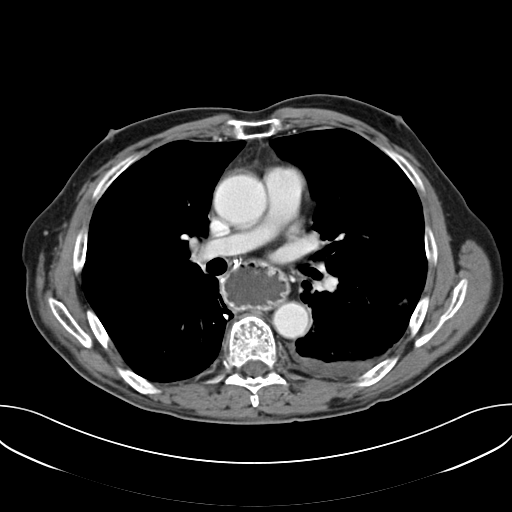
[im 106/135  lung]
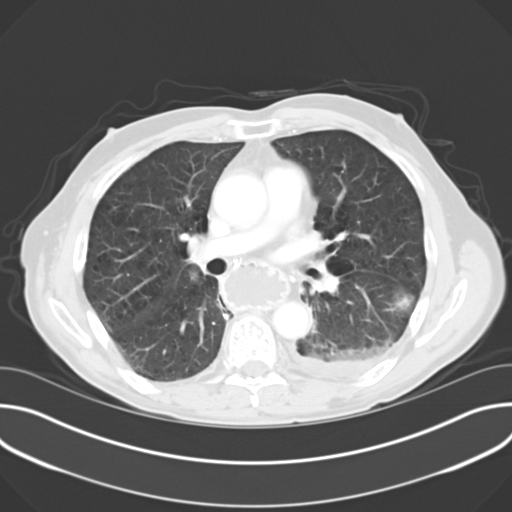
[im 113/135  soft-tissue]
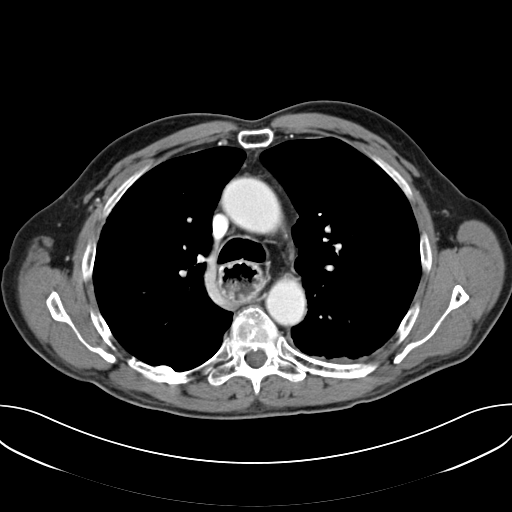
[im 113/135  lung]
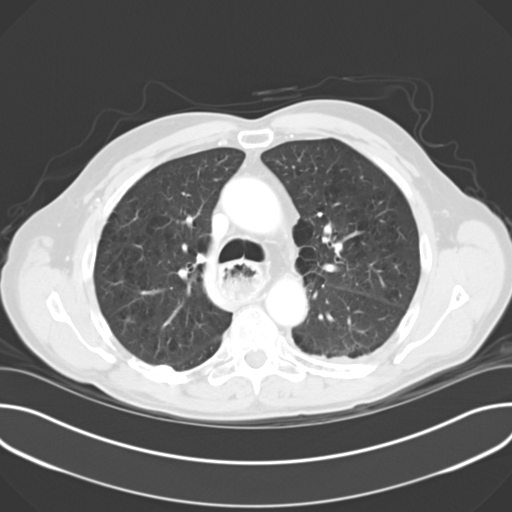
[im 120/135  lung]
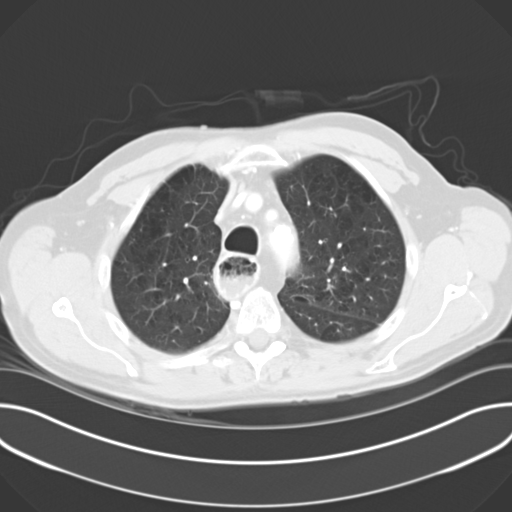
[im 127/135  soft-tissue]
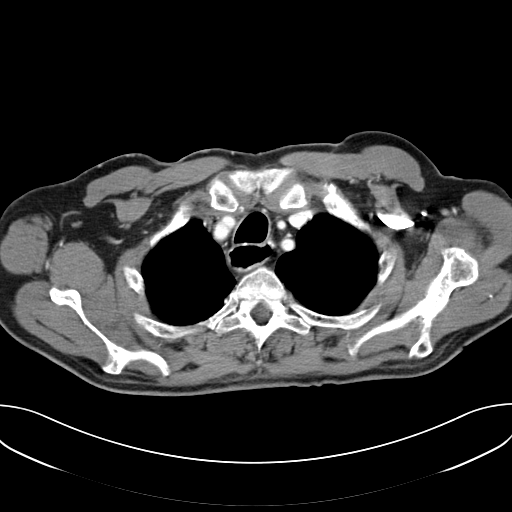
[im 127/135  lung]
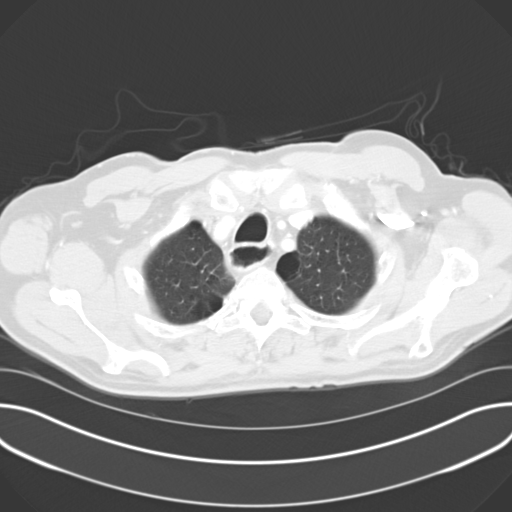

[13 of 32 positions shown; findings below may reference images not displayed]

FINDINGS: The chest wall, soft tissues, and bony structures are stable.  Remote healed right-sided rib fractures are again noted.  The heart size is normal.   There is no pericardial effusion.  Stable coronary artery calcifications are noted.  There is a stable small, 1 cm lymph node adjacent to the right brachiocephalic artery.  No other enlarged mediastinal or hilar lymph nodes are seen.  There are stable surgical changes related to the gastric pull-through procedure.  The lungs demonstrate stable changes of COPD with apical bullous changes and areas of pleural thickening.  I don?t see any worrisome pulmonary nodules to suggest metastatic pulmonary disease.  There is dependent atelectasis and a small left pleural effusion.  There is stable elevation of the left hemidiaphragm.
IMPRESSION: 1.  Stable CT appearance of the chest.  There is one small 1 cm lymph node in the upper mediastinum but no adenopathy.
 2.  Stable appearance of the gastric pull-through.
 3.  Stable lung changes with COPD.  No findings worrisome for metastatic pulmonary disease.
 4.  Bibasilar atelectasis and small left pleural effusion.
 ABDOMEN CT WITH CONTRAST:
FINDINGS: There are stable surgical changes related to a right adrenalectomy and a cholecystectomy and stable right renal cyst.  There are no hepatic lesions   The spleen is normal in size.  The pancreas is unremarkable and stable.  The ampulla is slightly prominent but this appears stable and I don?t see a discrete mass.  The duodenum, small bowel, and colon demonstrate no significant abnormalities.  Stable abdominal aortic aneurysm is seen with maximum measurements of 3.3 x 2.2 cm with dense atherosclerotic calcifications and mural thrombus.  No mesenteric or retroperitoneal masses or adenopathy are seen.
 The appendix is visualized and is normal.
IMPRESSION: 1.  Stable CT appearance of the abdomen.  No findings worrisome for metastatic disease.
 2.  There are a few small, stable, retroperitoneal lymph nodes and stable surgical changes.  
 3.  Stable small bilateral renal cysts.
 4.  Stable infrarenal abdominal aortic aneurysm.
 PELVIS CT WITH CONTRAST:
FINDINGS: The rectum, sigmoid colon, and visualized small bowel loops are unremarkable.  There is a moderate amount of stool in the colon which may suggest constipation.  The bladder appears normal.  There are no pelvic masses, adenopathy, or free pelvic fluid collections and no significant bony findings.
IMPRESSION: Unremarkable and stable CT appearance of the pelvis.  No evidence for metastatic disease.

## 2006-05-02 ENCOUNTER — Ambulatory Visit: Payer: Self-pay | Admitting: Hematology & Oncology

## 2006-05-07 ENCOUNTER — Encounter: Admission: RE | Admit: 2006-05-07 | Discharge: 2006-05-07 | Payer: Self-pay | Admitting: Thoracic Surgery

## 2006-05-07 LAB — CBC WITH DIFFERENTIAL/PLATELET
BASO%: 0.5 % (ref 0.0–2.0)
Basophils Absolute: 0 10*3/uL (ref 0.0–0.1)
EOS%: 2.6 % (ref 0.0–7.0)
MCH: 31.7 pg (ref 28.0–33.4)
MCHC: 33.5 g/dL (ref 32.0–35.9)
MCV: 94.7 fL (ref 81.6–98.0)
MONO%: 11.5 % (ref 0.0–13.0)
RBC: 4.36 10*6/uL (ref 4.20–5.71)
RDW: 14.8 % — ABNORMAL HIGH (ref 11.2–14.6)

## 2006-05-07 LAB — COMPREHENSIVE METABOLIC PANEL
AST: 23 U/L (ref 0–37)
Albumin: 4.3 g/dL (ref 3.5–5.2)
Alkaline Phosphatase: 75 U/L (ref 39–117)
BUN: 17 mg/dL (ref 6–23)
Potassium: 4.3 mEq/L (ref 3.5–5.3)
Sodium: 139 mEq/L (ref 135–145)

## 2006-05-10 ENCOUNTER — Ambulatory Visit (HOSPITAL_COMMUNITY): Admission: RE | Admit: 2006-05-10 | Discharge: 2006-05-10 | Payer: Self-pay | Admitting: Hematology & Oncology

## 2006-07-01 ENCOUNTER — Encounter: Admission: RE | Admit: 2006-07-01 | Discharge: 2006-07-01 | Payer: Self-pay | Admitting: Internal Medicine

## 2006-07-25 IMAGING — CR DG CHEST 2V
2 series · 2 of 2 positions shown · non-contrast
Comparison: 11/07/05.

CLINICAL DATA: History of esophageal carcinoma.  
 CHEST - 2 VIEW:

[w chest pa]
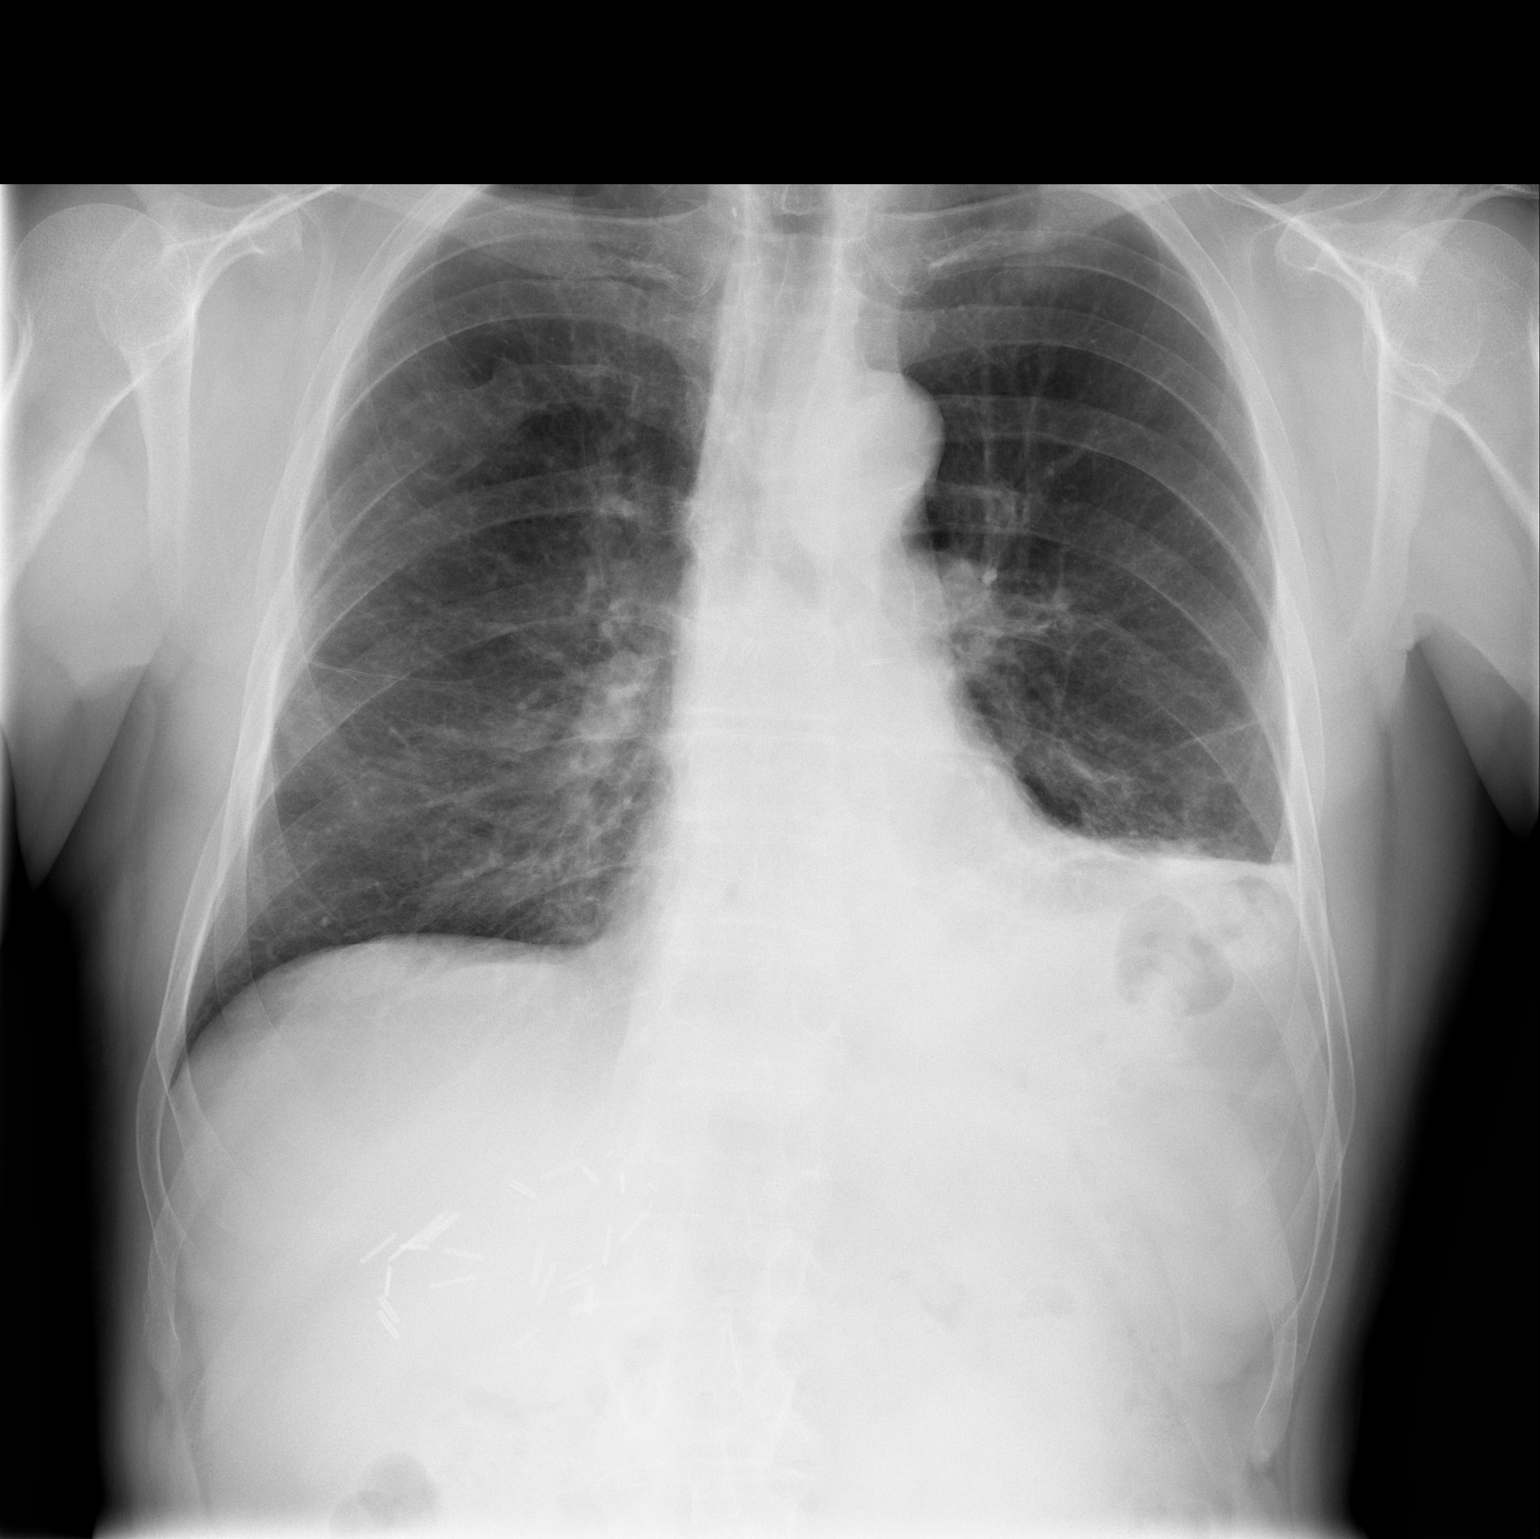

[w chest lat]
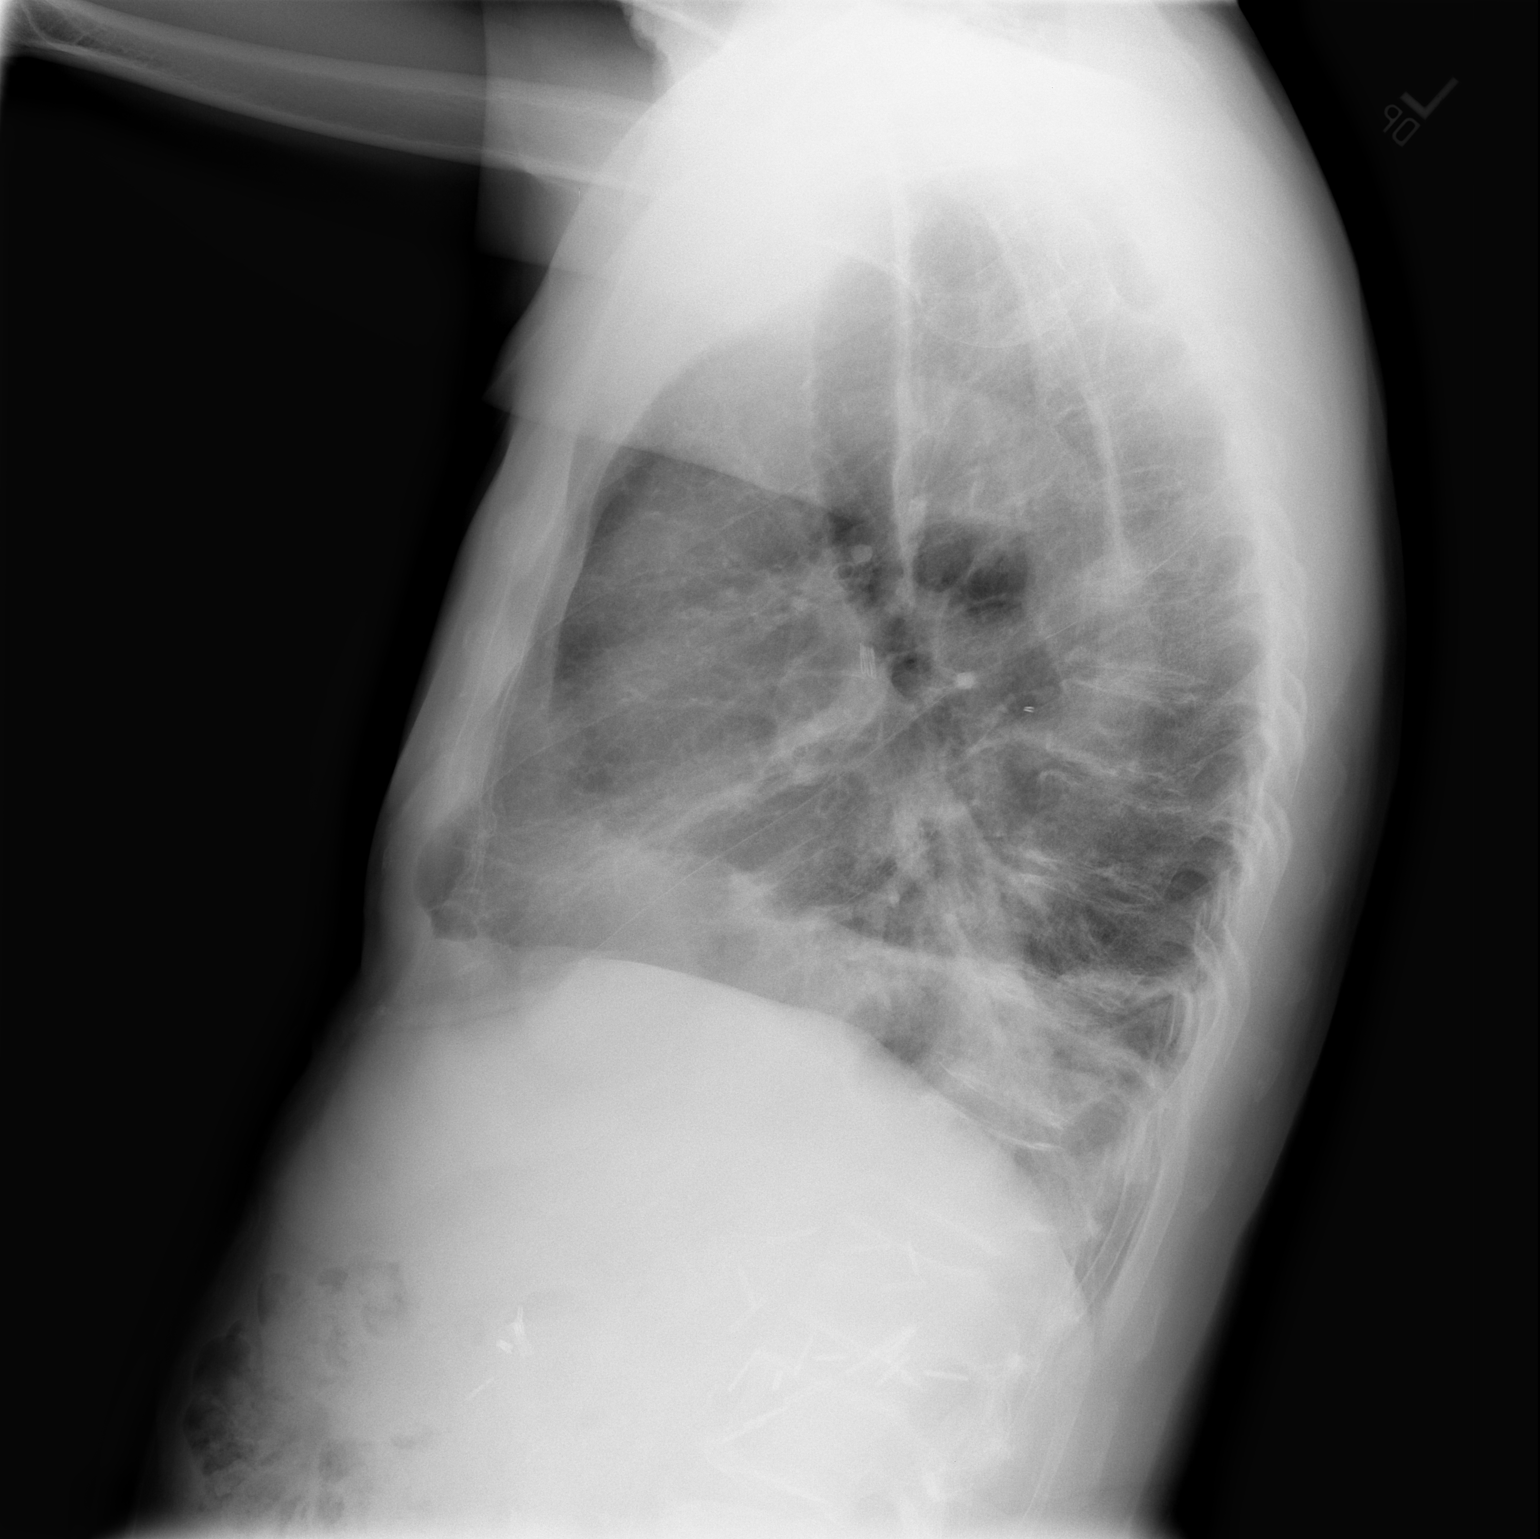

[2 of 2 positions shown; findings below may reference images not displayed]

FINDINGS: Two views of the chest show volume loss at the left lung base with linear atelectasis present.  The right lung is clear.   Heart size is stable.
IMPRESSION: Chronic change at the left lung base.  No active lung disease.

## 2006-07-28 IMAGING — CT CT PELVIS W/ CM
2 of 5 series · 16 of 46 positions shown, 18 images · IV contrast (omnipaque)
Comparison: 11/13/05.
COMPARISON: 11/13/05.
COMPARISON: 11/13/05.

CLINICAL DATA: Esophageal CA diagnosed [DATE].  Chemotherapy and XRT complete [DATE].  Shortness of breath.
CHEST CT WITH CONTRAST:
TECHNIQUE: Multidetector CT imaging of the chest was performed following the standard protocol during bolus administration of intravenous contrast.
Contrast:  125 cc Omnipaque 300.
TECHNIQUE: Multidetector CT imaging of the abdomen was performed following the standard protocol during bolus administration of intravenous contrast.
TECHNIQUE: Multidetector CT imaging of the pelvis was performed following the standard protocol during bolus administration of intravenous contrast.

[Series 2: cap 5.0 b40f st · axial · 0.72mm/px · z∈[-701,-111]mm · 13 of 134 slices shown, 15 images]
[im 8/134  soft-tissue]
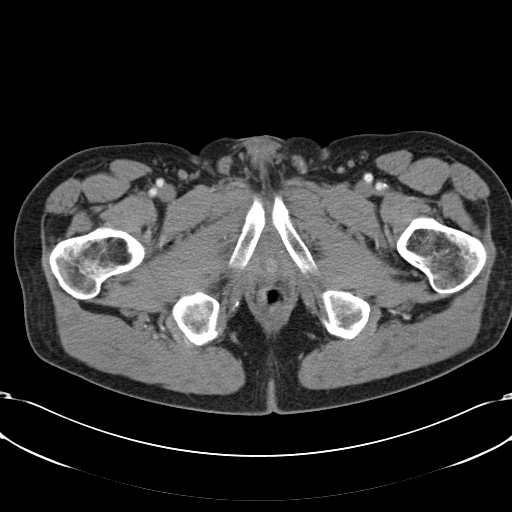
[im 8/134  bone]
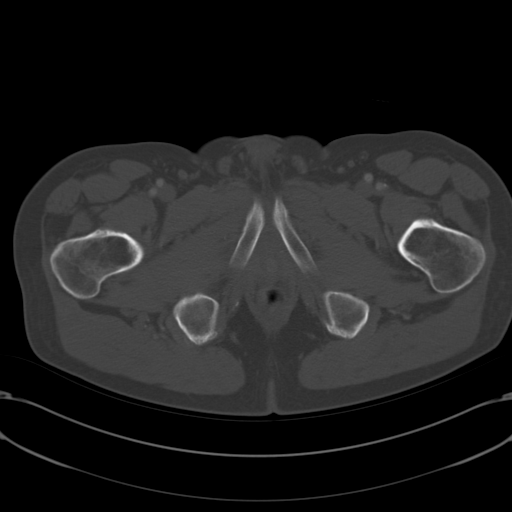
[im 15/134  soft-tissue]
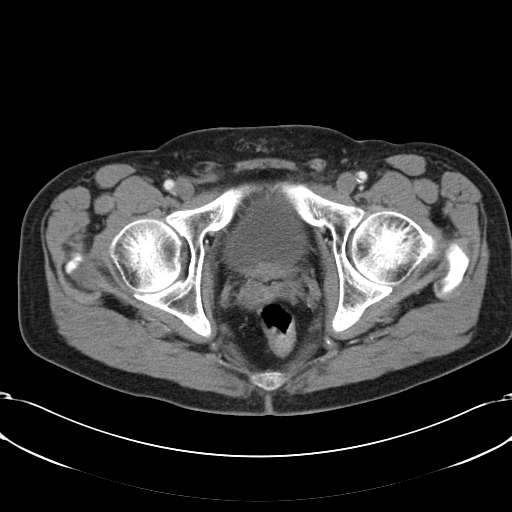
[im 30/134  soft-tissue]
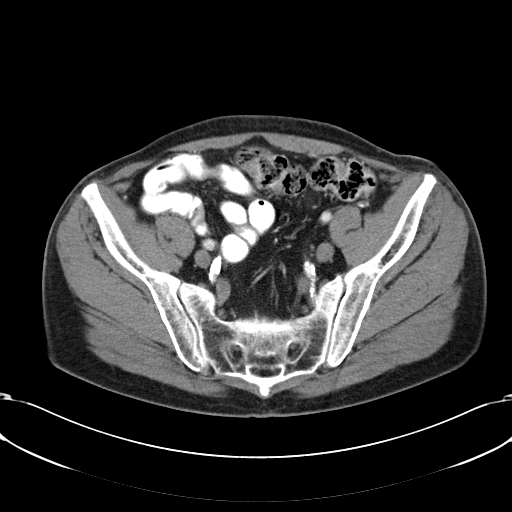
[im 37/134  soft-tissue]
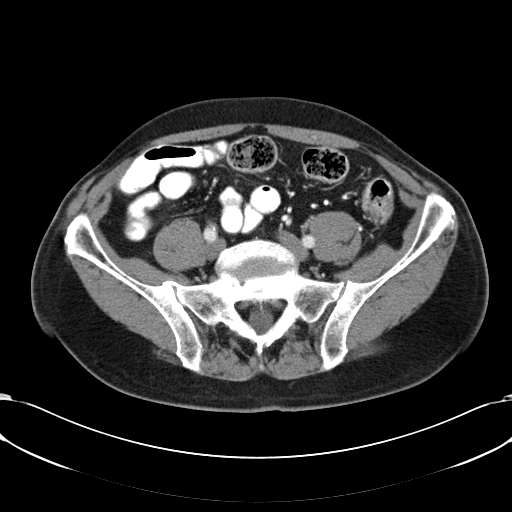
[im 45/134  soft-tissue]
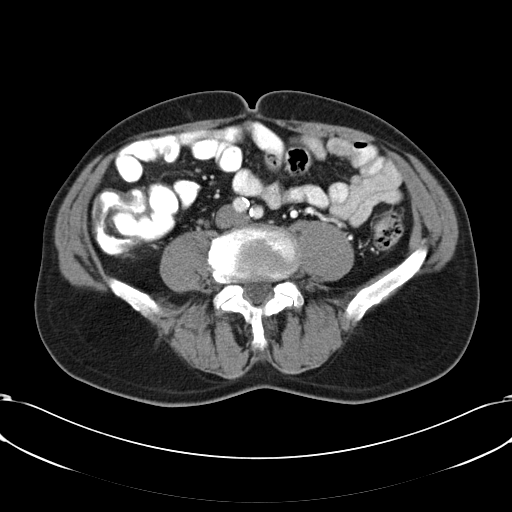
[im 60/134  soft-tissue]
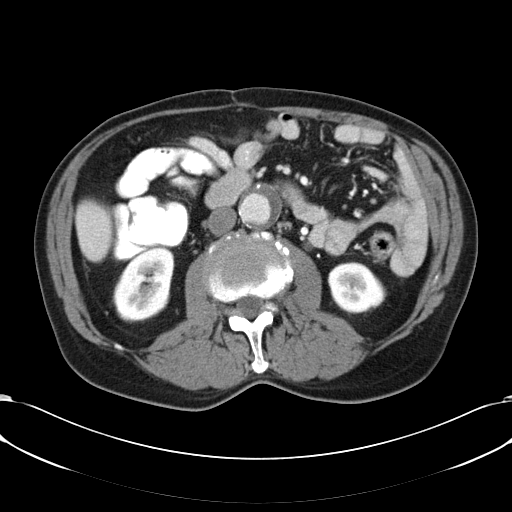
[im 67/134  soft-tissue]
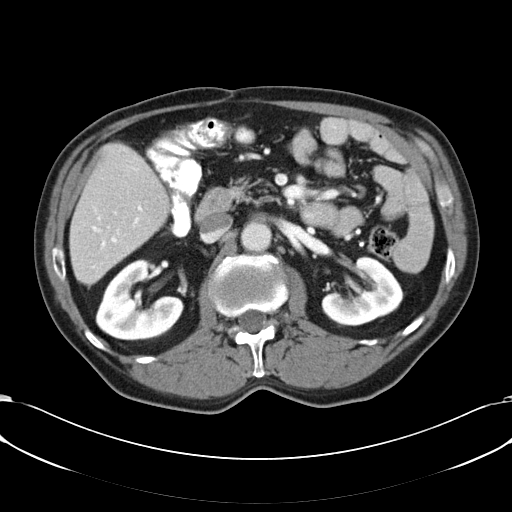
[im 74/134  soft-tissue]
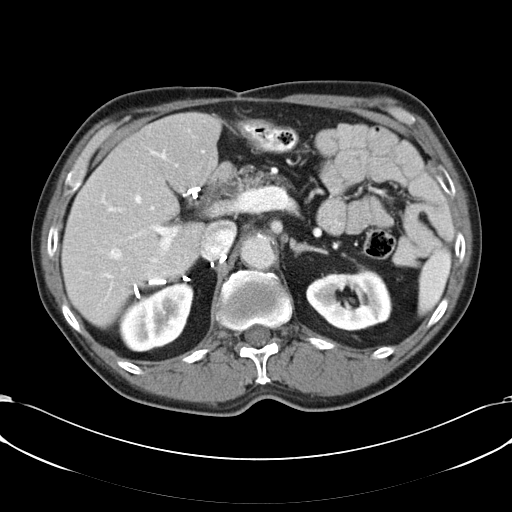
[im 89/134  soft-tissue]
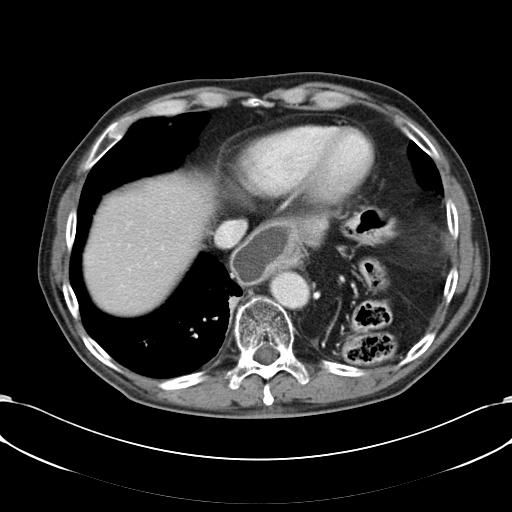
[im 89/134  bone]
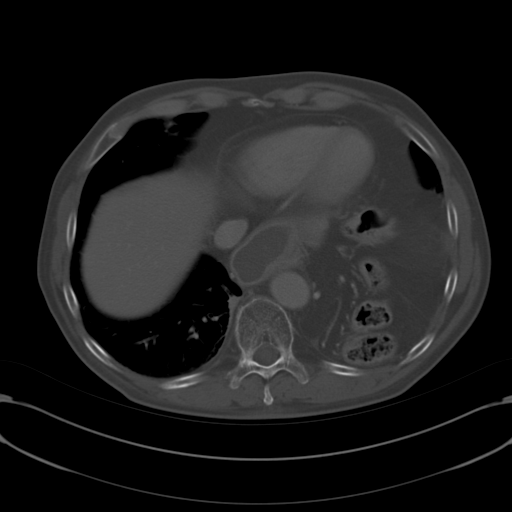
[im 97/134  soft-tissue]
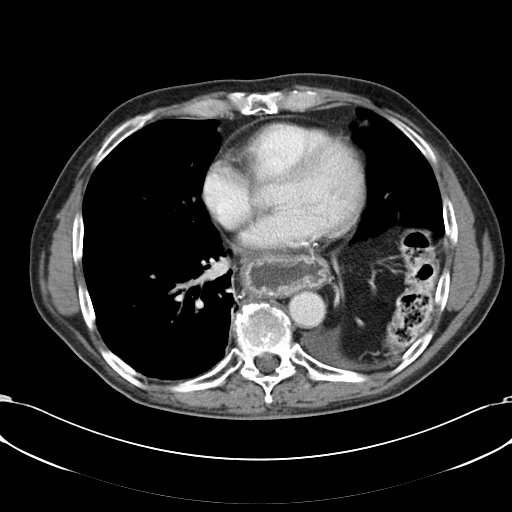
[im 104/134  soft-tissue]
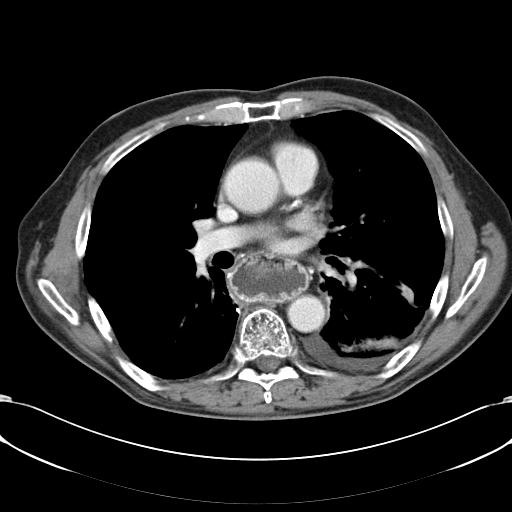
[im 119/134  soft-tissue]
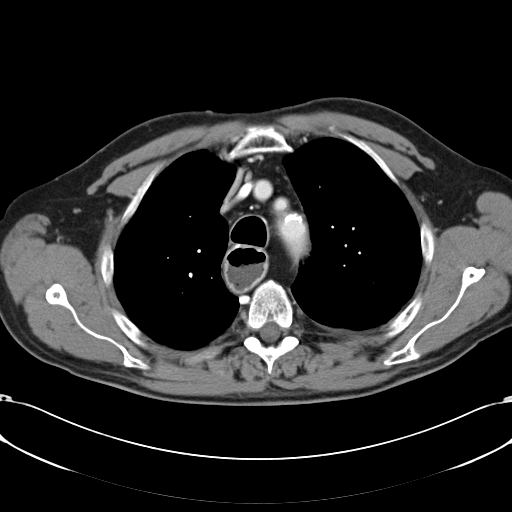
[im 126/134  soft-tissue]
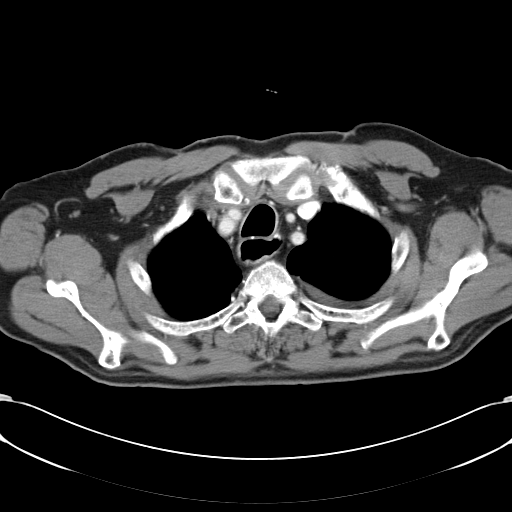

[Series 602: coronal · coronal · 1.33mm/px · 3 of 39 slices shown]
[im 13/39  soft-tissue]
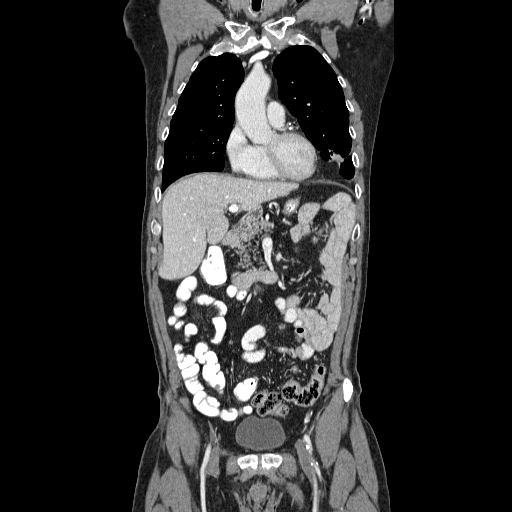
[im 17/39  soft-tissue]
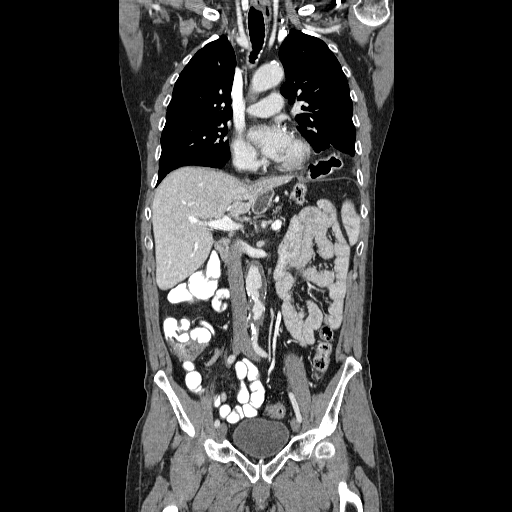
[im 22/39  soft-tissue]
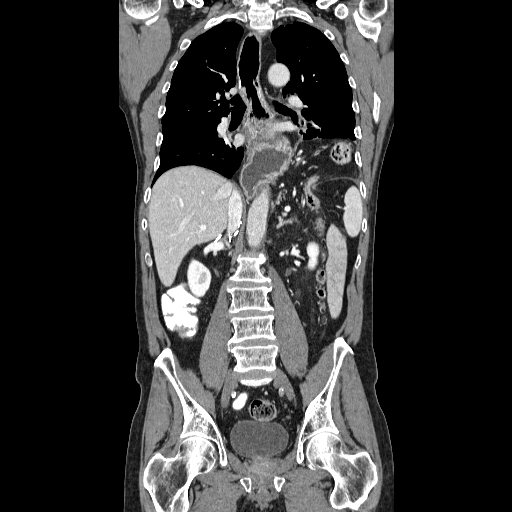

[16 of 46 positions shown; findings below may reference images not displayed]

FINDINGS: Stable slightly prominent sized lymph node in the right superior mediastinum adjacent to the right brachiocephalic artery.  Short axis diameter of the node is 9 mm.  No pathologically enlarged lymph nodes.  Stable surgical changes of gastric pull-through procedure.  Stable elevation of the left hemidiaphragm.  Again appreciated are changes of COPD.  Mild atelectatic changes at the left base.  No pulmonary nodules or masses.
IMPRESSION: No findings to strongly suggest recurrent/residual tumor of the chest.  
ABDOMEN CT WITH CONTRAST:
FINDINGS: Changes compatible with gastric pull-through for esophageal procedure for esophageal carcinoma.  As noted on 11/13/05 CT, the patient has an infrarenal abdominal aortic aneurysm with maximum measurements of 3.3 x 3.3 cm stable in size since prior exam.  Negative for metastatic involvement of the abdomen.  No pathologically enlarged lymph nodes.
IMPRESSION: Stable infrarenal AAA.  No findings suspicious for metastatic involvement of the abdomen.  
PELVIS CT WITH CONTRAST:
FINDINGS: Prostate gland is prominent in size but appears stable.  Seminal vesicles and bladder are unremarkable.  Pelvic sidewalls are well defined.  No pathologically enlarged lymph nodes.
IMPRESSION: Stable CT pelvis with no findings to suggest metastatic disease.

## 2006-09-18 IMAGING — US US CAROTID DUPLEX BILAT
1 series · 13 of 24 positions shown · non-contrast
Comparison: none

CLINICAL DATA: Right carotid bruit. 
BILATERAL CAROTID DUPLEX ULTRASOUND: 
No prior studies for comparison.
Velocities are as follows (cm per second):
SITE  PEAK SYSTOLIC  END-DIASTOLIC
RIGHT ICA  119  42
RIGHT CCA  65  23
RIGHT ICA/CCA RATIO
RIGHT ECA  118
LEFT ICA  72    27
LEFT CCA  85  23 
LEFT ICA/CCA RATIO  .86  .92
LEFT ECA  86
The right carotid bifurcation shows moderate plaque burden with predominantly calcified and partially noncalcified plaque present at the level of the carotid bulb and extending into the proximal internal carotid artery.  Turbulent flow is present at the bulb.  Maximal velocity at the level of the proximal ICA is 119 cm per second.  There is evidence of diffuse spectral broadening of ICA waveforms.  ICA/CCA systolic ratio is 1.8.  Based on current imaging, estimated proximal ICA stenosis is less than 50% based on velocity criteria.  The findings do approach nearly the level of a 50 to 69% stenosis and continued surveillance is recommended with ultrasound. 
The left carotid bifurcation shows a smaller overall plaque burden than the right with calcified plaque present extending into the proximal ICA.  Waveforms demonstrate normal velocities and no spectral broadening.  Based on velocity criteria, estimated left ICA stenosis is less than 50%.  
Antegrade flow was detected in both vertebral arteries.

[Series 1: unknown · 0.06mm/px · 13 of 65 slices shown]
[im 1/65]
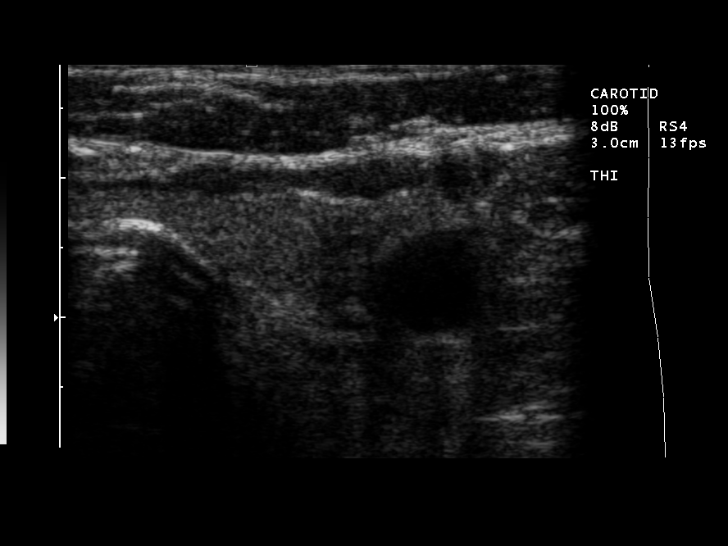
[im 6/65]
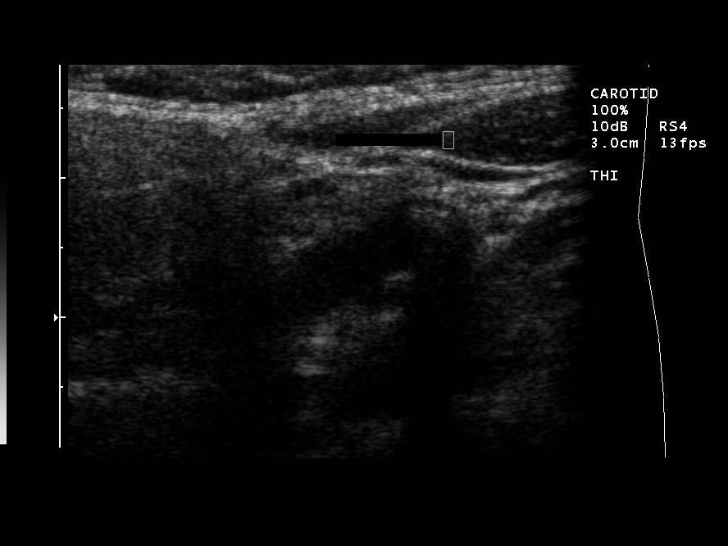
[im 12/65]
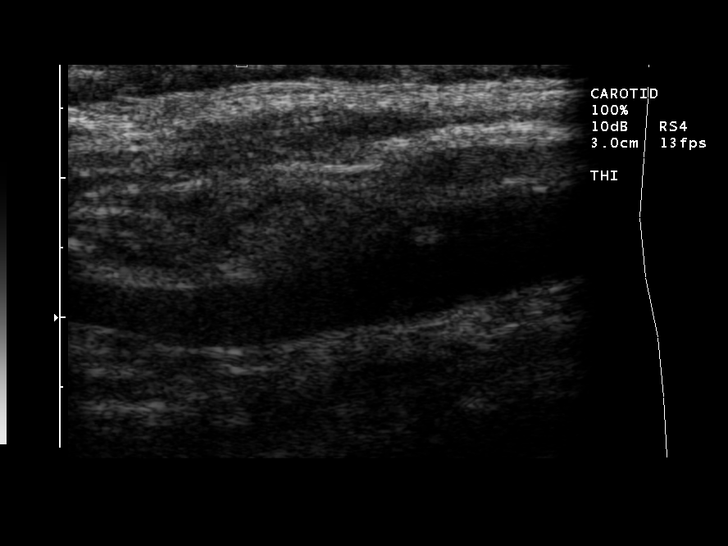
[im 17/65]
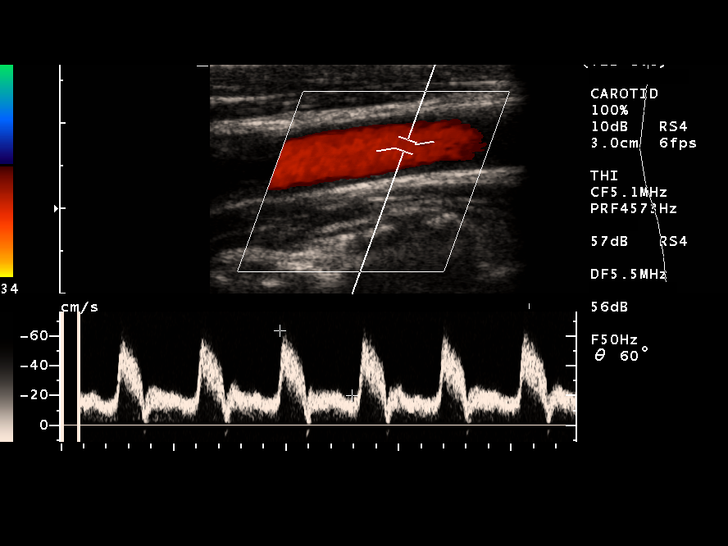
[im 23/65]
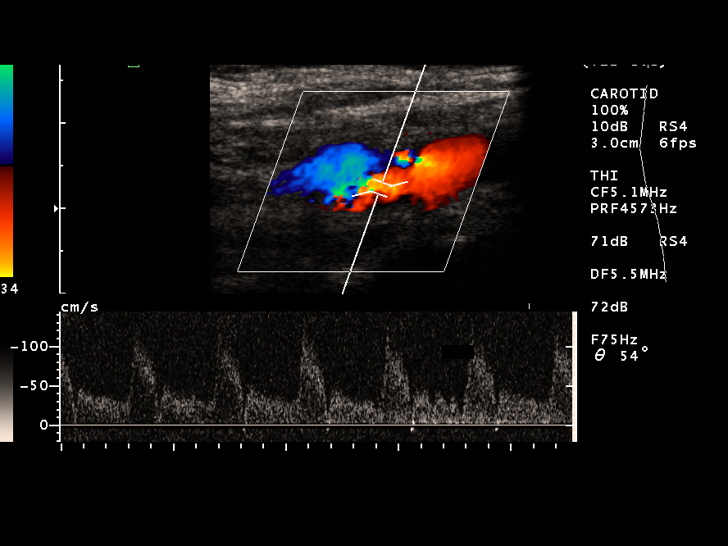
[im 28/65]
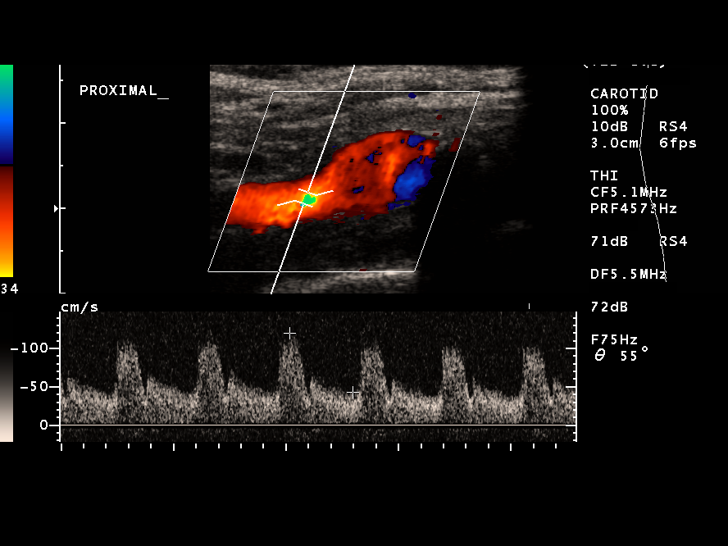
[im 34/65]
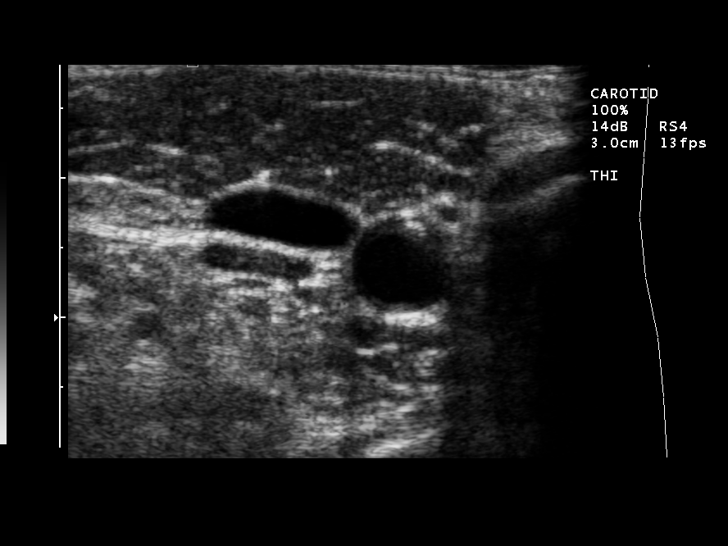
[im 37/65]
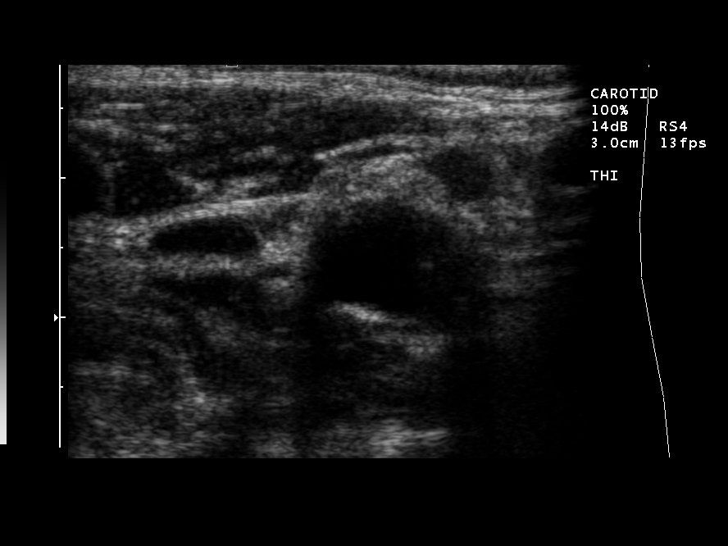
[im 42/65]
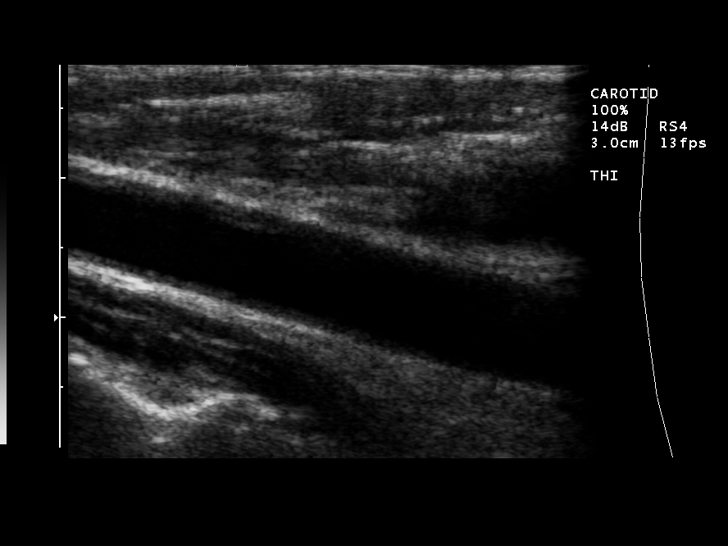
[im 48/65]
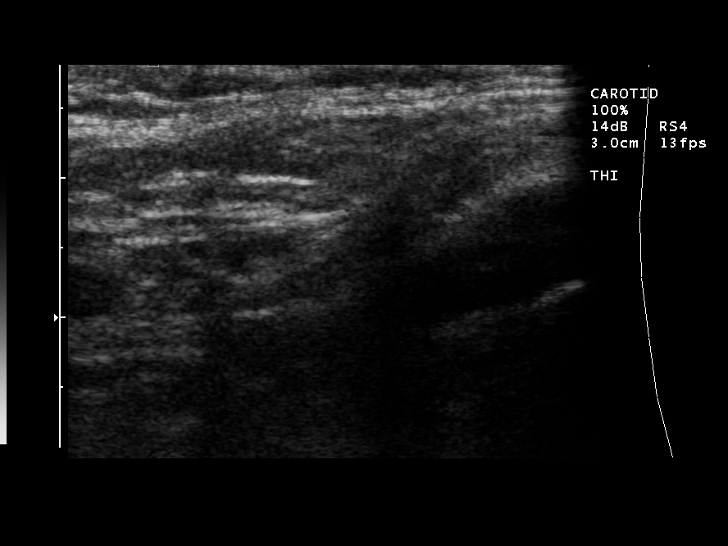
[im 53/65]
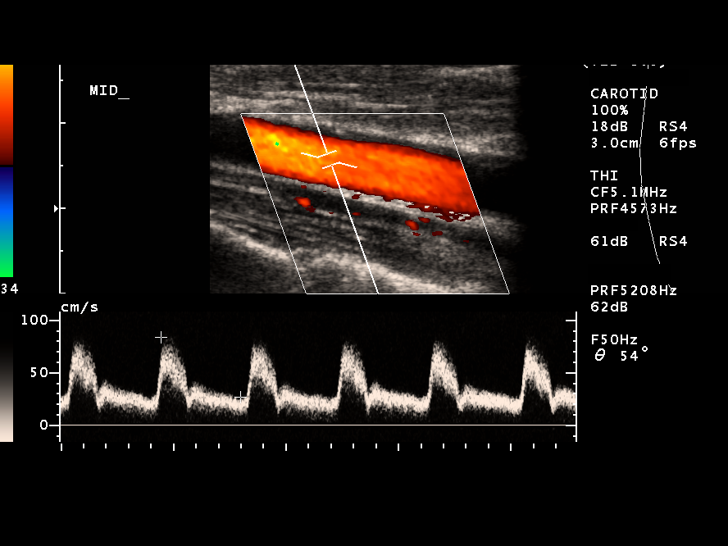
[im 59/65]
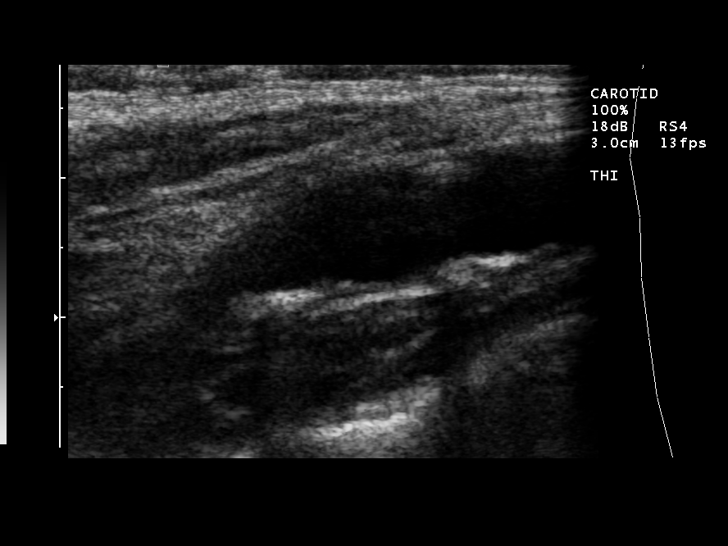
[im 65/65]
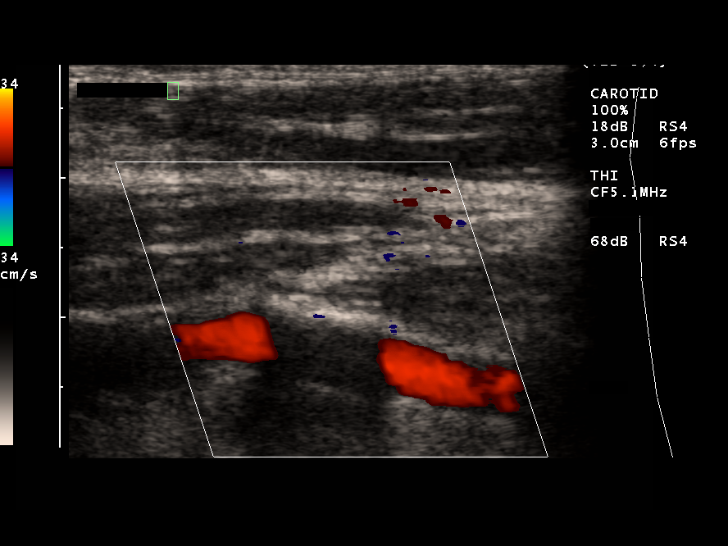

[13 of 24 positions shown; findings below may reference images not displayed]

IMPRESSION: 1.  Moderate plaque burden at the right carotid bifurcation.  Based on current criteria, estimated stenosis is less than 50%.  However, maximal velocity and velocity ratios approach nearly the 50 to 69% category of stenosis.  Continued periodic ultrasound surveillance may be helpful. 
2.  Milder plaque burden at the left carotid bifurcation with estimated ICA stenosis of less than 50%.

## 2006-10-30 ENCOUNTER — Ambulatory Visit: Payer: Self-pay | Admitting: Hematology & Oncology

## 2006-11-04 LAB — CBC WITH DIFFERENTIAL/PLATELET
BASO%: 0.4 % (ref 0.0–2.0)
EOS%: 3.3 % (ref 0.0–7.0)
HGB: 12.3 g/dL — ABNORMAL LOW (ref 13.0–17.1)
MCH: 31.4 pg (ref 28.0–33.4)
MCHC: 33.1 g/dL (ref 32.0–35.9)
MONO#: 0.7 10*3/uL (ref 0.1–0.9)
RDW: 15.4 % — ABNORMAL HIGH (ref 11.2–14.6)
WBC: 6.7 10*3/uL (ref 4.0–10.0)
lymph#: 2 10*3/uL (ref 0.9–3.3)

## 2006-11-04 LAB — COMPREHENSIVE METABOLIC PANEL
ALT: 25 U/L (ref 0–53)
AST: 21 U/L (ref 0–37)
Albumin: 4.1 g/dL (ref 3.5–5.2)
Alkaline Phosphatase: 59 U/L (ref 39–117)
Chloride: 105 mEq/L (ref 96–112)
Potassium: 4.8 mEq/L (ref 3.5–5.3)
Sodium: 139 mEq/L (ref 135–145)
Total Protein: 6.4 g/dL (ref 6.0–8.3)

## 2006-11-06 ENCOUNTER — Encounter: Admission: RE | Admit: 2006-11-06 | Discharge: 2006-11-06 | Payer: Self-pay | Admitting: Thoracic Surgery

## 2006-11-07 ENCOUNTER — Ambulatory Visit (HOSPITAL_COMMUNITY): Admission: RE | Admit: 2006-11-07 | Discharge: 2006-11-07 | Payer: Self-pay | Admitting: Hematology & Oncology

## 2007-01-24 IMAGING — CR DG CHEST 2V
2 series · 2 of 2 positions shown · non-contrast
Comparison: 05/07/2006

CLINICAL DATA: Esophageal cancer

[w chest pa]
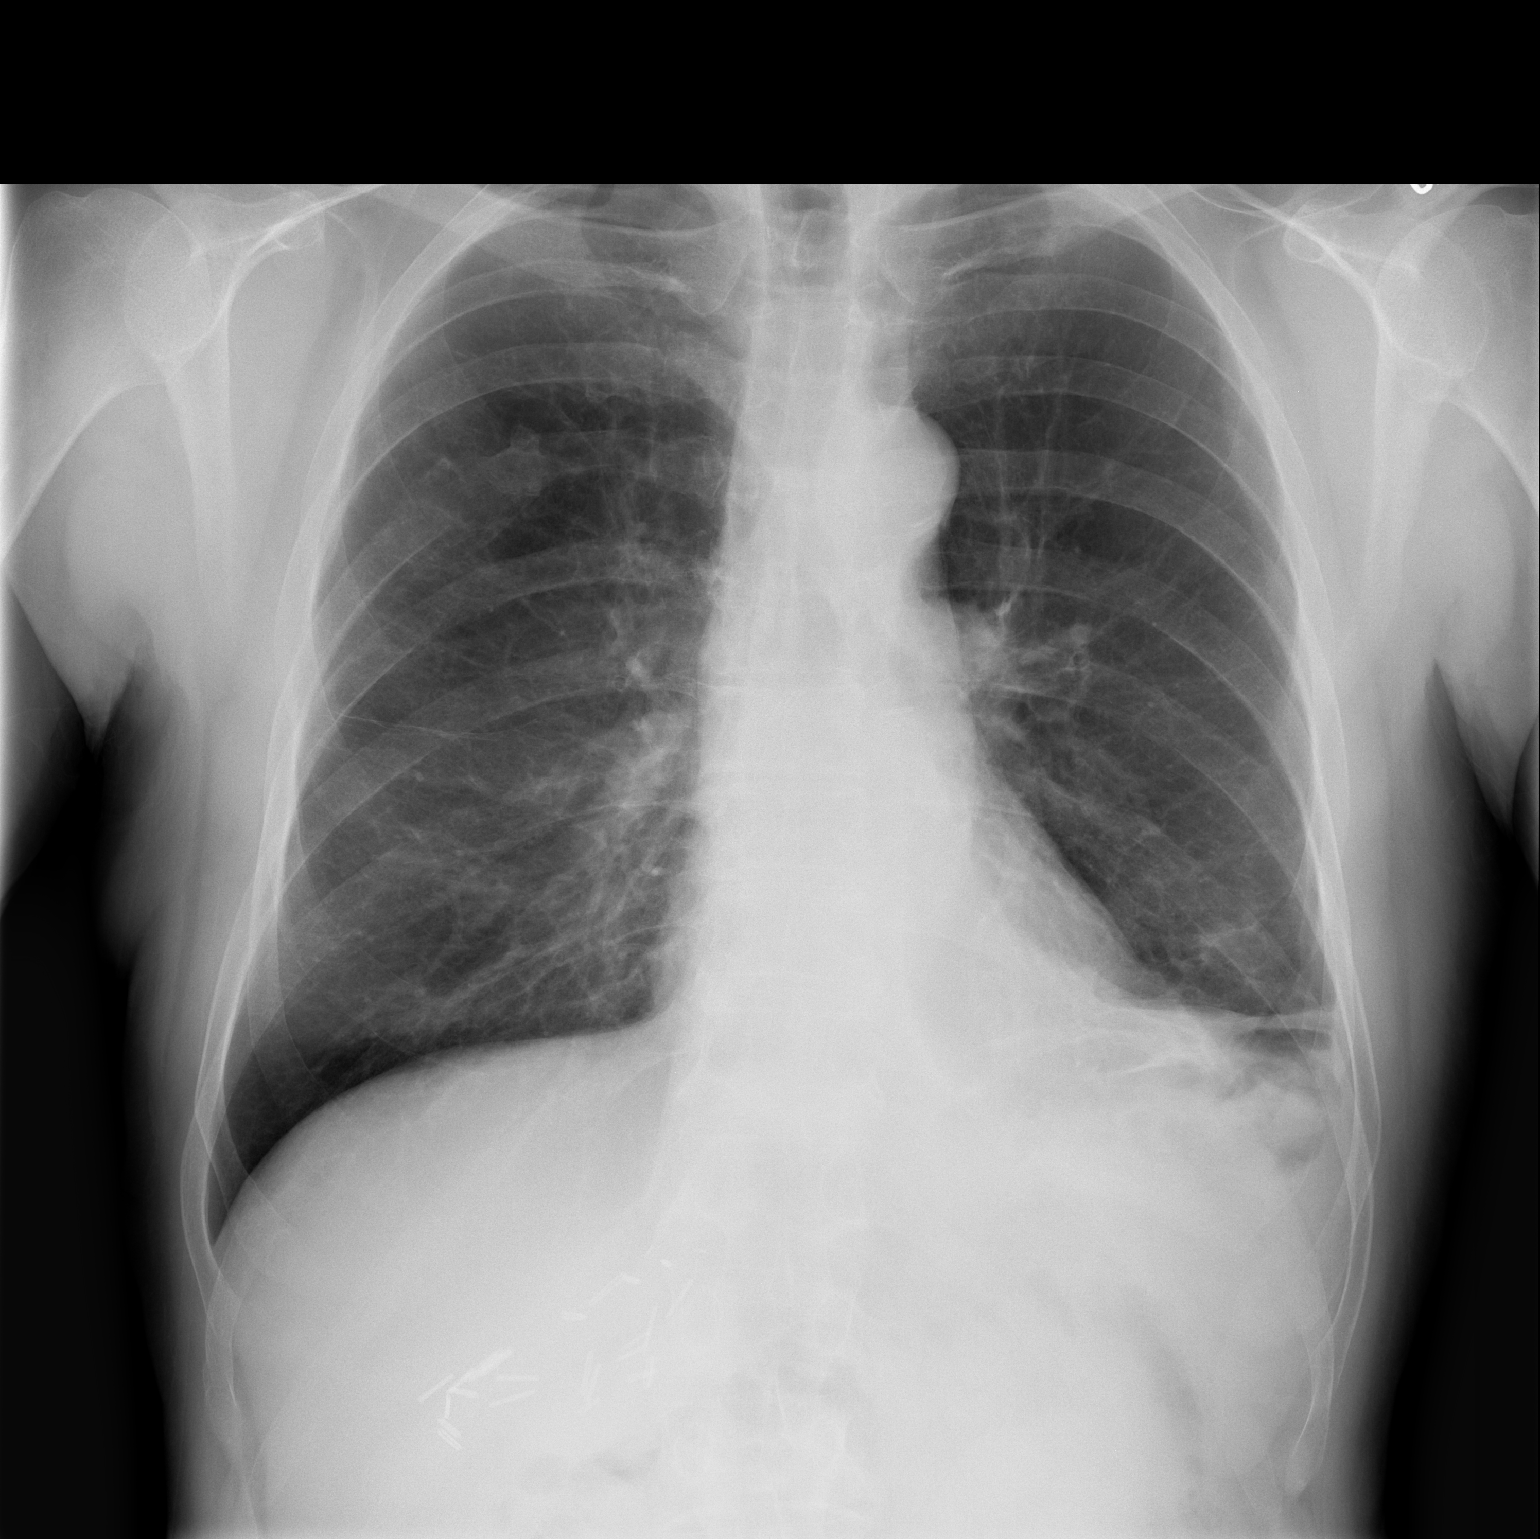

[w chest lat]
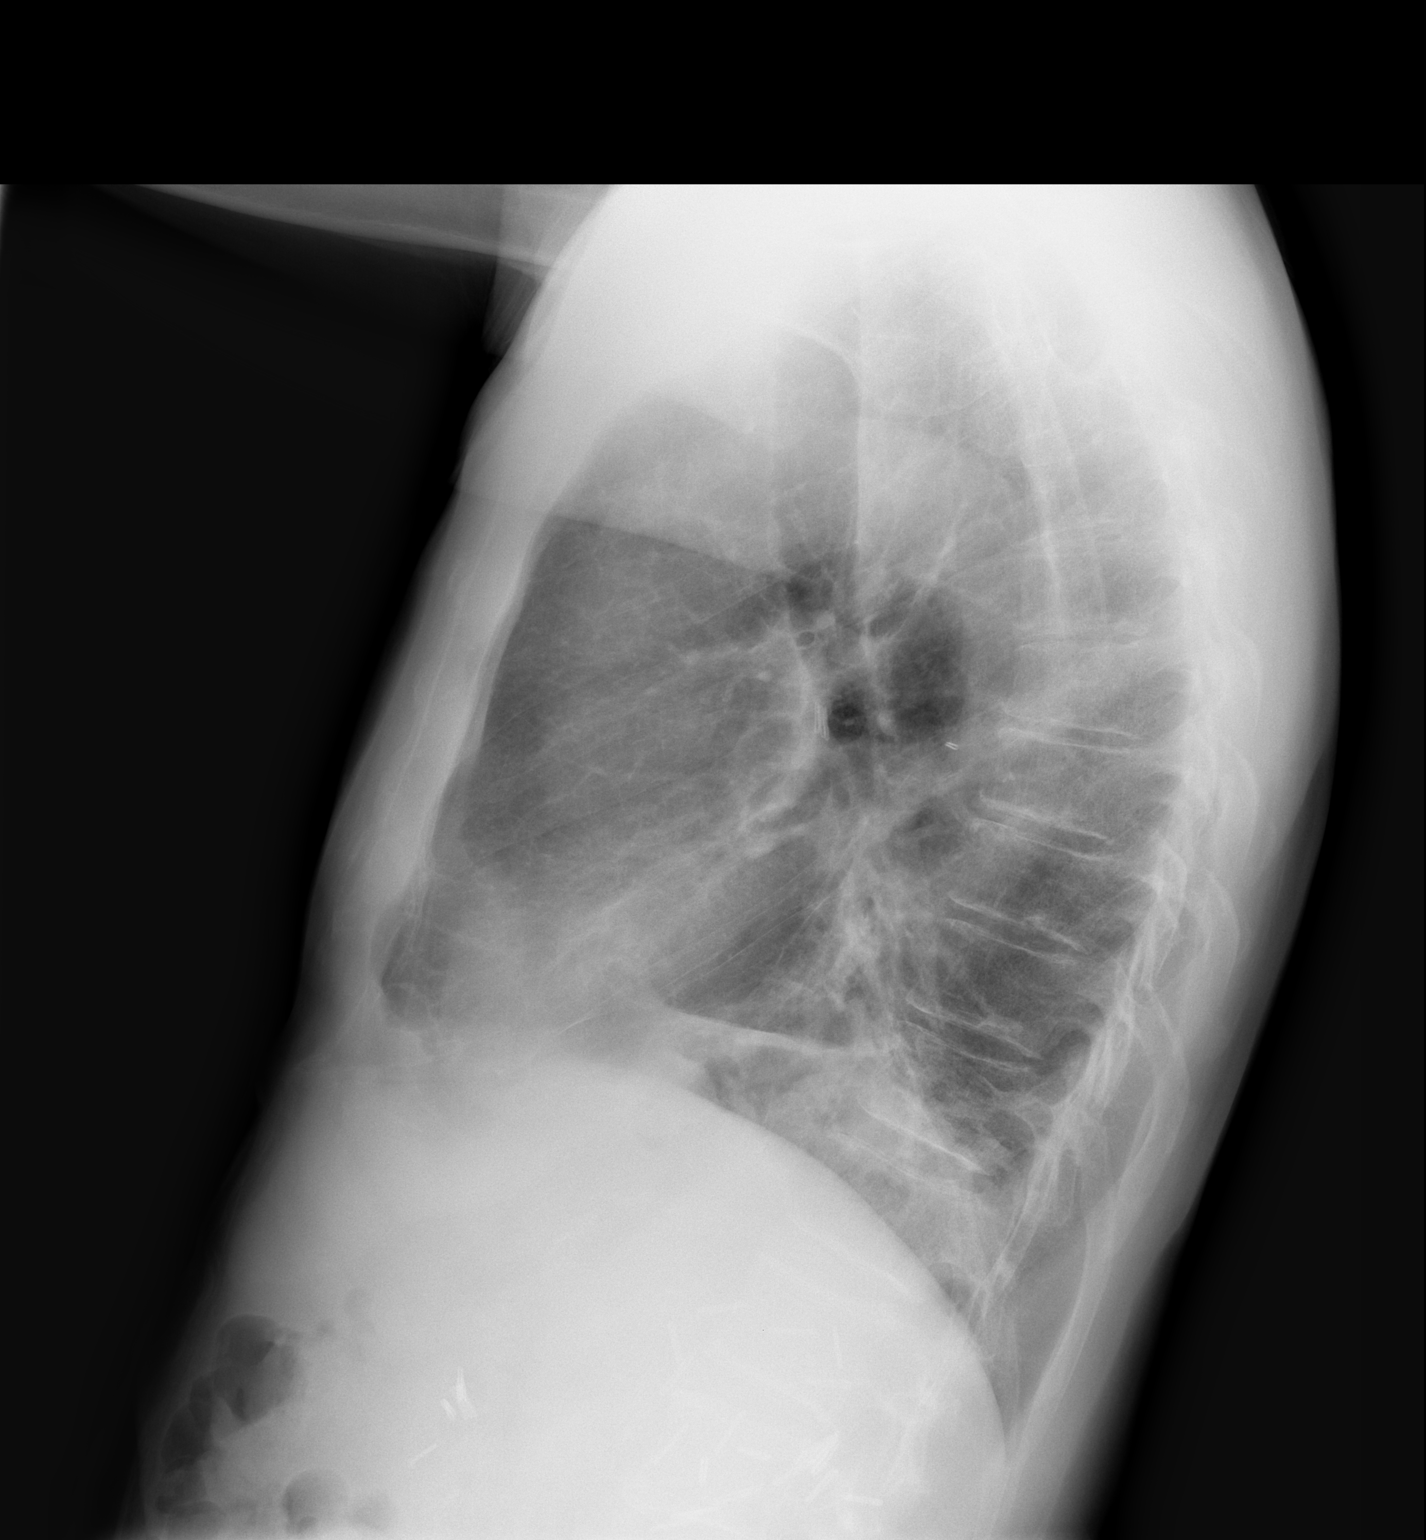

[2 of 2 positions shown; findings below may reference images not displayed]

CHEST - 2 VIEW:

Stable exam. The scarring at the left base is unchanged. Right lung is clear.
Chronic changes in the posterior right 6th rib are stable. Cardiopericardial
silhouette is within normal limits for size.
IMPRESSION: Stable exam. No acute cardiopulmonary findings.

## 2007-01-25 IMAGING — CT CT CHEST W/ CM
2 of 5 series · 17 of 46 positions shown, 19 images · non-contrast
Comparison: none

CLINICAL DATA: Esophageal carcinoma status post resection, chemotherapy,
radiation

[Series 2: cap 5.0 b40f · axial · 0.68mm/px · z∈[-679,-84]mm · 14 of 134 slices shown, 16 images]
[im 8/134  soft-tissue]
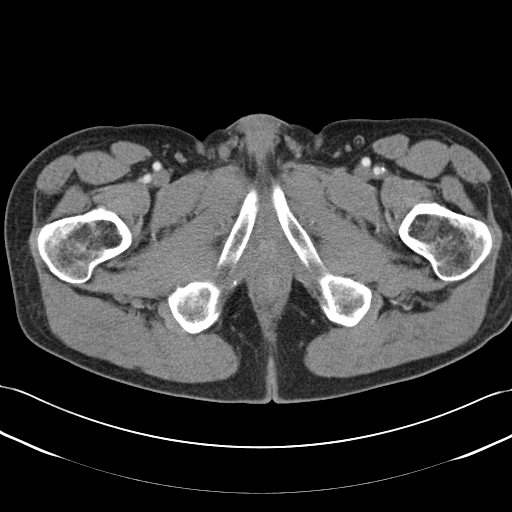
[im 8/134  bone]
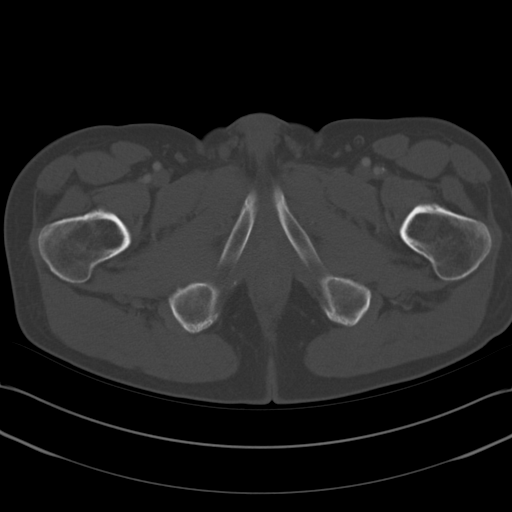
[im 15/134  soft-tissue]
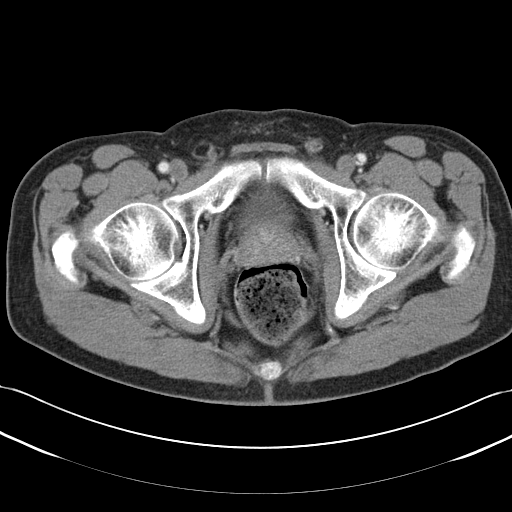
[im 29/134  soft-tissue]
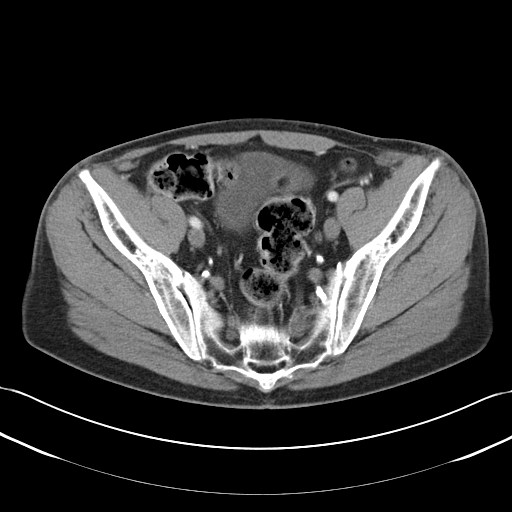
[im 36/134  soft-tissue]
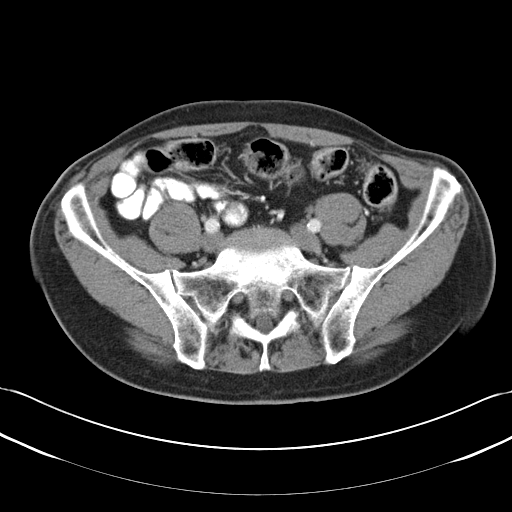
[im 43/134  soft-tissue]
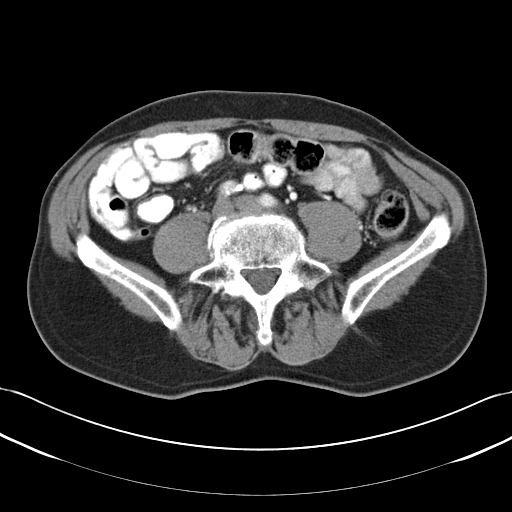
[im 57/134  soft-tissue]
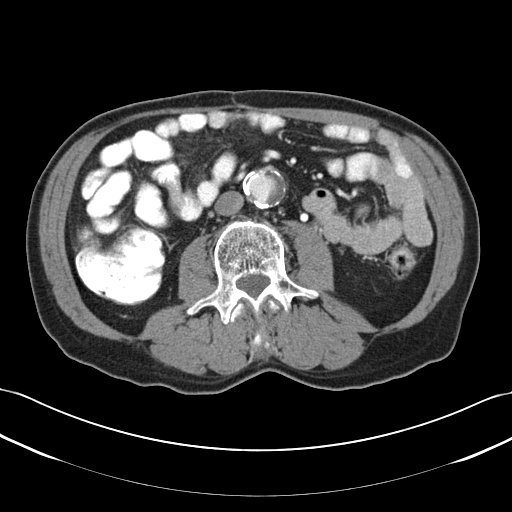
[im 64/134  soft-tissue]
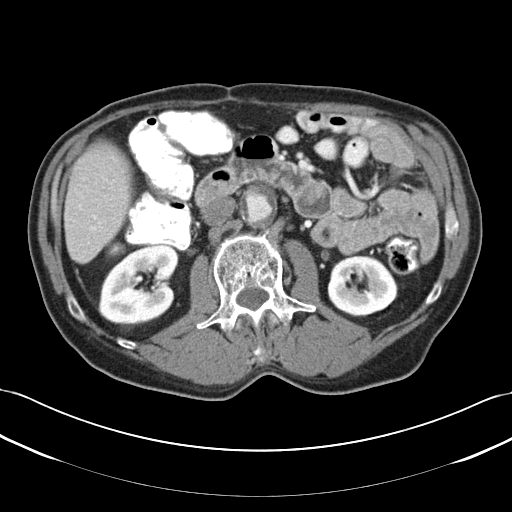
[im 71/134  soft-tissue]
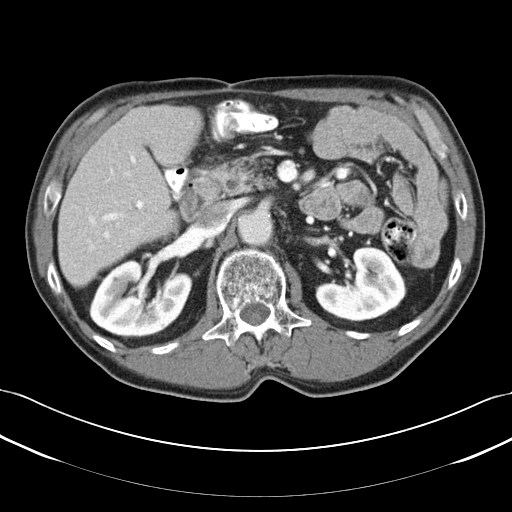
[im 78/134  soft-tissue]
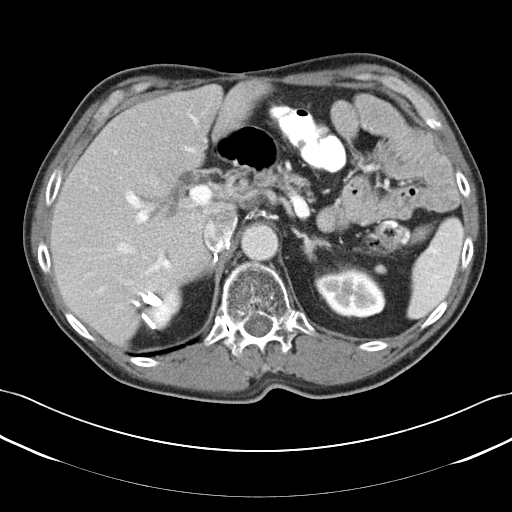
[im 78/134  bone]
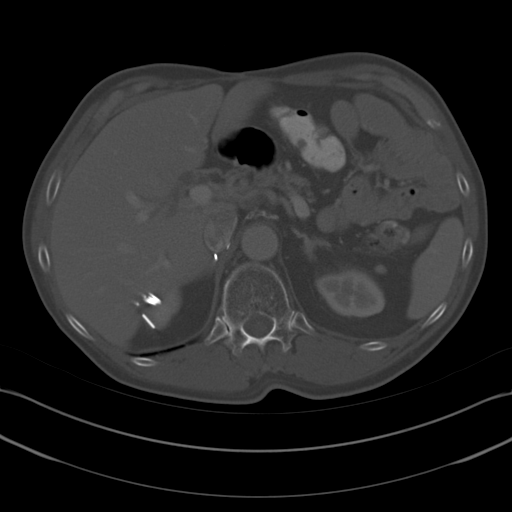
[im 92/134  soft-tissue]
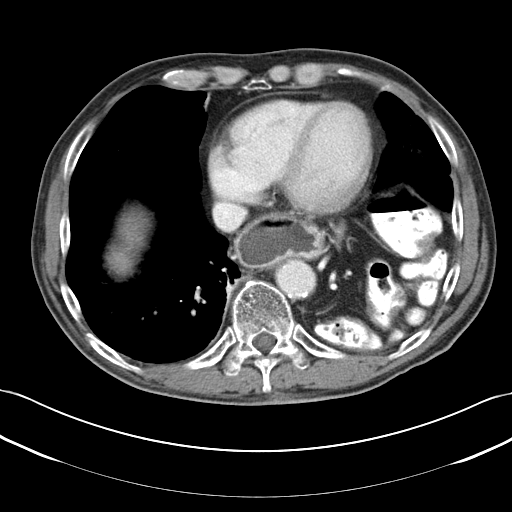
[im 99/134  soft-tissue]
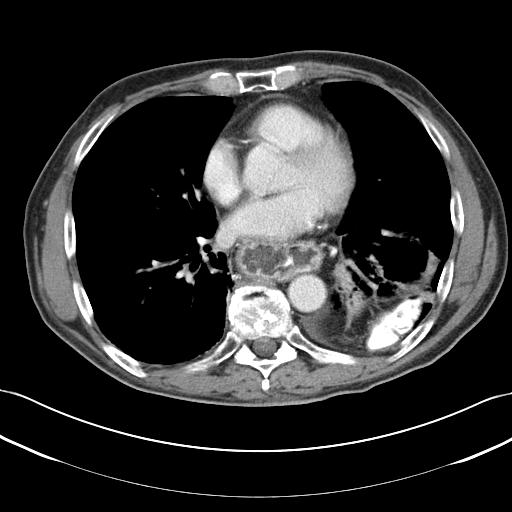
[im 106/134  soft-tissue]
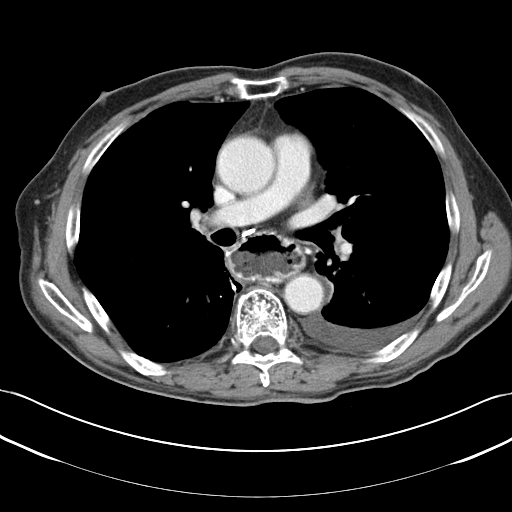
[im 120/134  soft-tissue]
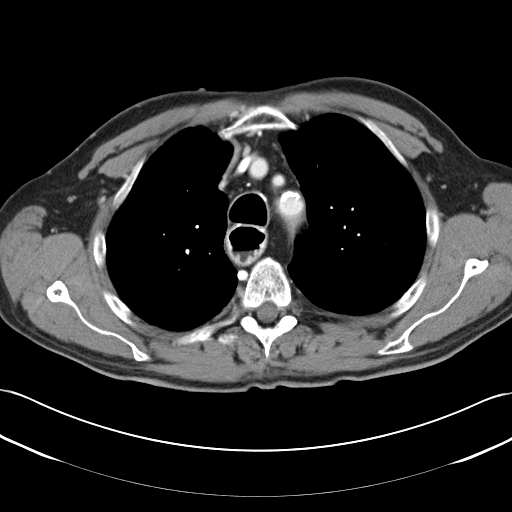
[im 127/134  soft-tissue]
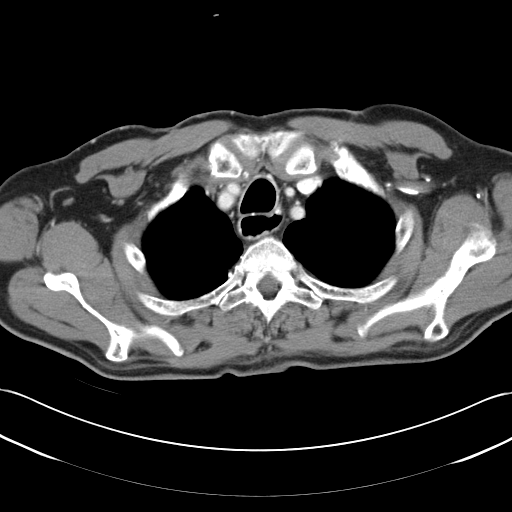

[Series 4: mpr coronal cap · coronal · 1.31mm/px · 3 of 67 slices shown]
[im 23/67  soft-tissue]
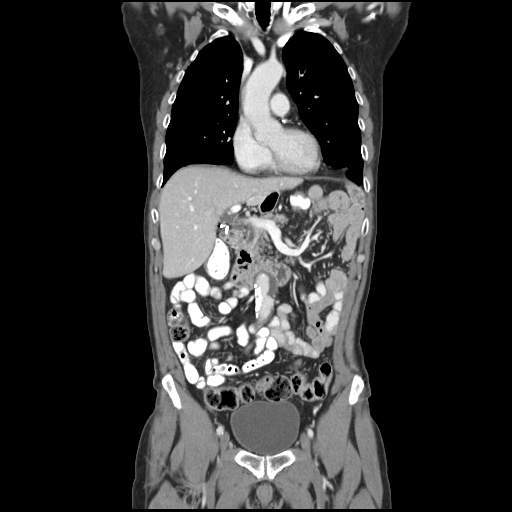
[im 30/67  soft-tissue]
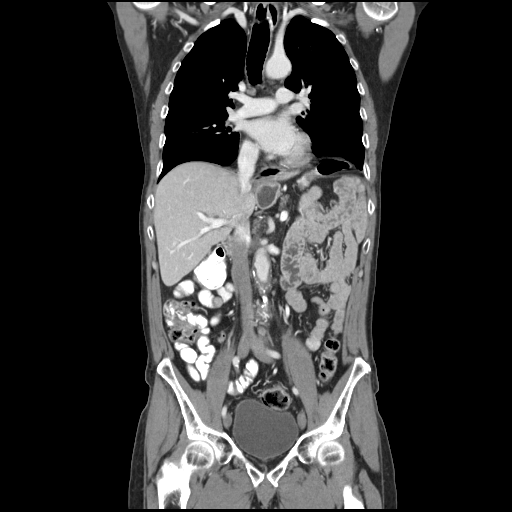
[im 37/67  soft-tissue]
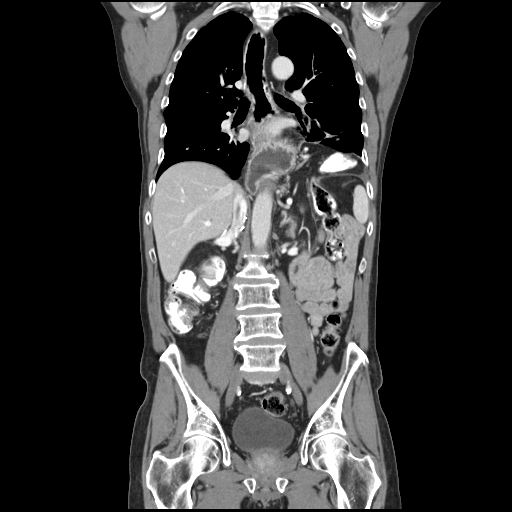

[17 of 46 positions shown; findings below may reference images not displayed]

CT chest with contrast:

Multidetector helical CT after 125  ml Jmnipaque-V00 IV.

Comparison 05/10/2006. Residual small left pleural effusion. Changes of
esophagectomy and gastric pull-through. The splenic flexure the colon herniates
through a left diaphragmatic defect into the lower left hemithorax as before.
Coronary and aortic calcifications. No hilar or mediastinal adenopathy. Stable 9
mm anterior mediastinal node. Bilateral emphysematous changes.
Consolidation/atelectasis in the posterior left lower lobe stable. Degenerative
changes in the thoracic spine. Coronal and sagittal reconstructions confirm the
above findings.
IMPRESSION: 1. Negative for mass or adenopathy. 
2. Stable postoperative changes.
3. Coronary and aortic calcifications.

CT abdomen with contrast:

Vascular clips in the gallbladder fossa and posterior to the right hepatic lobe.
Unremarkable liver, spleen, left adrenal gland, left kidney, pancreas. Stable
right renal cyst. Atheromatous abdominal aorta with a   3.4 cm infrarenal aortic
aneurysm with some mural thrombus. Small bowel decompressed. Stable
subcentimeter left periaortic and aortocaval lymph nodes. Portal vein is patent.
Degenerative changes in the lumbar spine. No free air. No ascites.
IMPRESSION: 1. Negative for mass or adenopathy. 
2. Stable postoperative changes as above.
3. Stable 3.4 cm infrarenal abdominal aortic aneurysm.

CT pelvis with contrast:

Normal appendix. Moderate fecal material throughout nondilated colon and rectum.
Urinary bladder physiologically distended. No free fluid. Iliofemoral
atheromatous change. No adenopathy. Prostate prominent.
IMPRESSION: 1. Negative for mass or adenopathy. Stable appearance since 05/10/2006.

## 2007-05-01 ENCOUNTER — Ambulatory Visit: Payer: Self-pay | Admitting: Hematology & Oncology

## 2007-05-06 LAB — CBC WITH DIFFERENTIAL/PLATELET
Basophils Absolute: 0 10*3/uL (ref 0.0–0.1)
EOS%: 3.2 % (ref 0.0–7.0)
HGB: 13.6 g/dL (ref 13.0–17.1)
MCH: 31.6 pg (ref 28.0–33.4)
MCV: 93.7 fL (ref 81.6–98.0)
MONO%: 12 % (ref 0.0–13.0)
NEUT%: 62.3 % (ref 40.0–75.0)
RDW: 15 % — ABNORMAL HIGH (ref 11.2–14.6)

## 2007-05-06 LAB — COMPREHENSIVE METABOLIC PANEL
BUN: 21 mg/dL (ref 6–23)
CO2: 25 mEq/L (ref 19–32)
Calcium: 9.6 mg/dL (ref 8.4–10.5)
Chloride: 105 mEq/L (ref 96–112)
Creatinine, Ser: 0.99 mg/dL (ref 0.40–1.50)

## 2007-05-07 ENCOUNTER — Ambulatory Visit: Payer: Self-pay | Admitting: Thoracic Surgery

## 2007-05-07 ENCOUNTER — Encounter: Admission: RE | Admit: 2007-05-07 | Discharge: 2007-05-07 | Payer: Self-pay | Admitting: Thoracic Surgery

## 2007-05-08 ENCOUNTER — Ambulatory Visit (HOSPITAL_COMMUNITY): Admission: RE | Admit: 2007-05-08 | Discharge: 2007-05-08 | Payer: Self-pay | Admitting: Hematology & Oncology

## 2007-07-25 IMAGING — CR DG CHEST 2V
2 series · 2 of 2 positions shown · non-contrast
Comparison: 11/06/06.

CLINICAL DATA: Esophageal carcinoma, follow-up.
TWO VIEW CHEST:

[w chest pa]
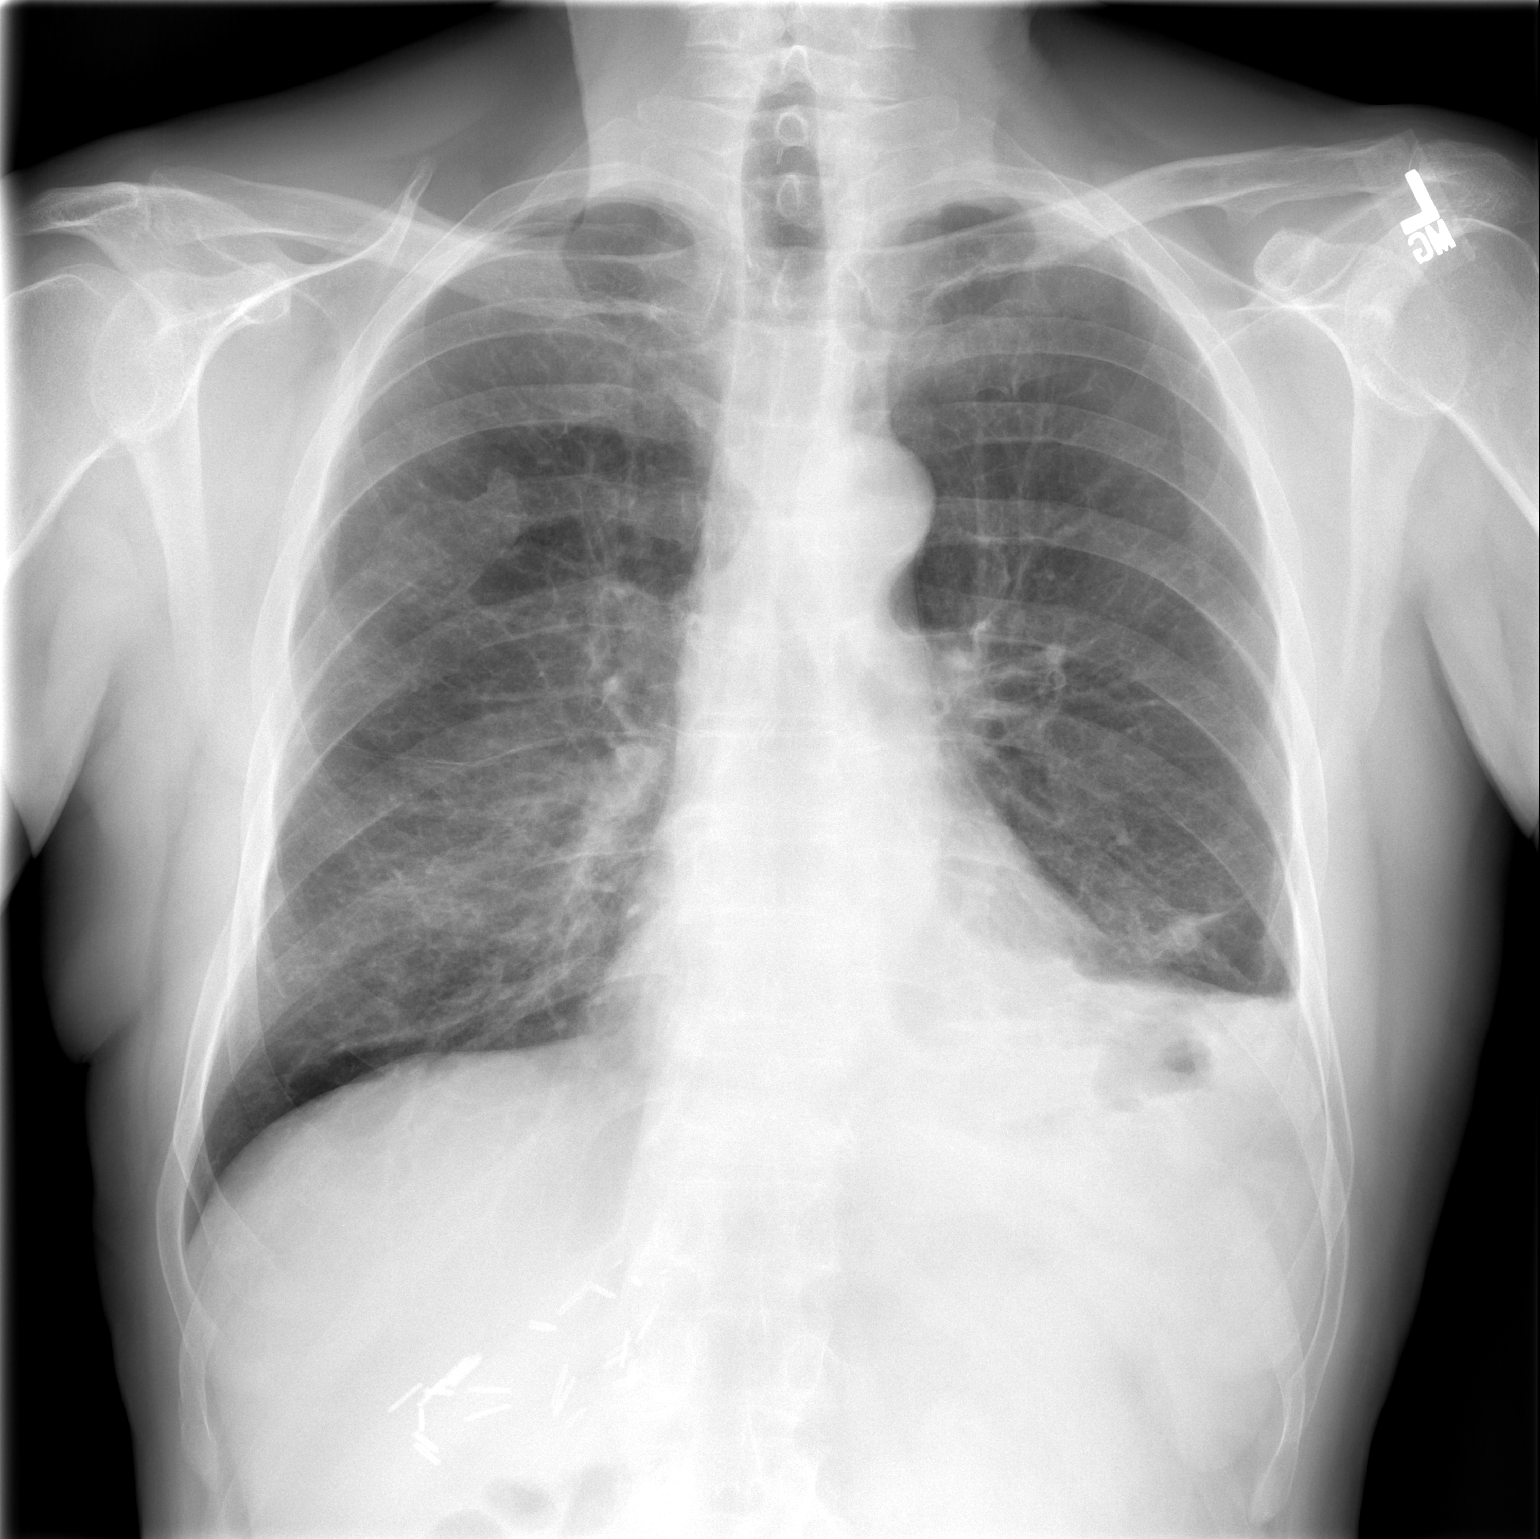

[w chest lat]
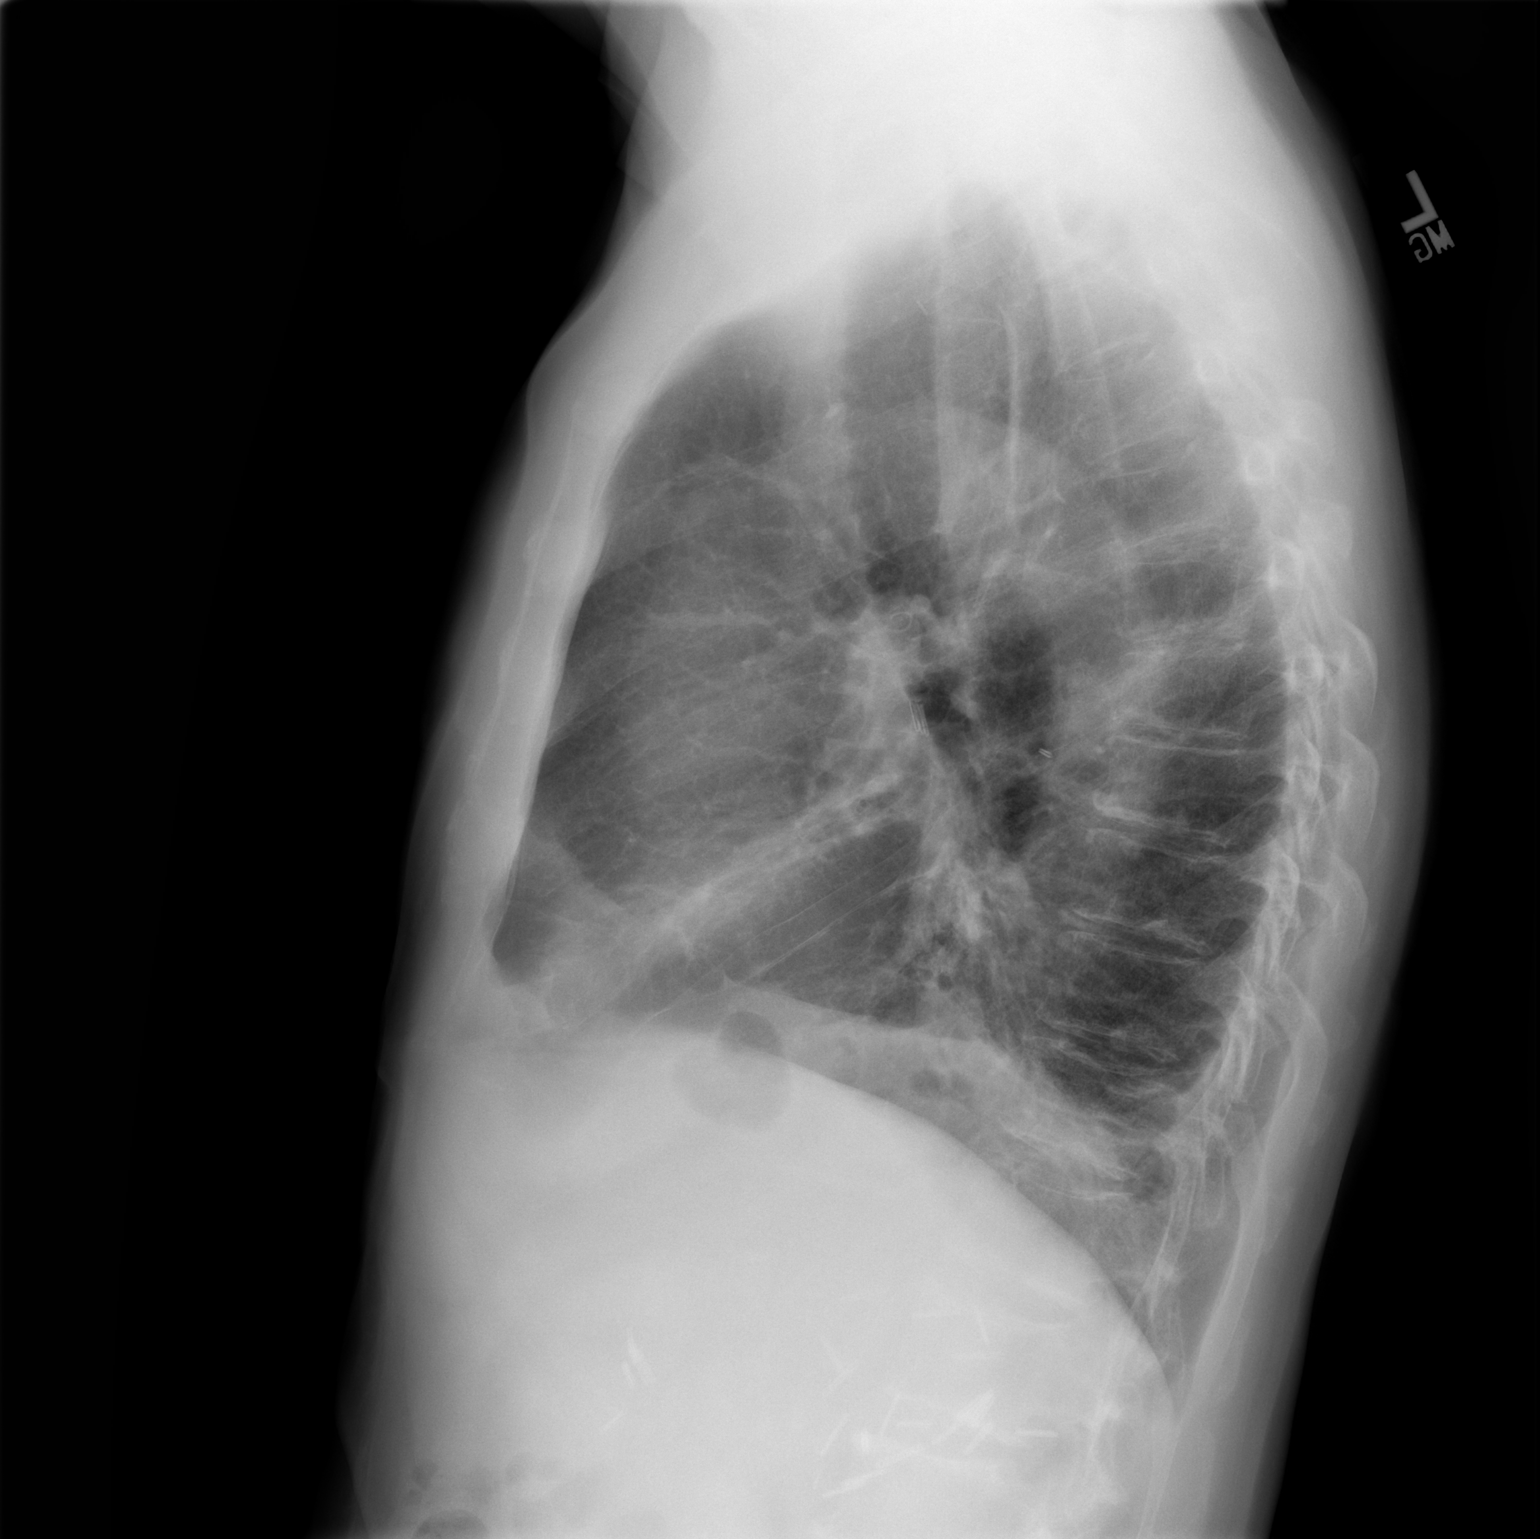

[2 of 2 positions shown; findings below may reference images not displayed]

There is little change in scarring at the left lung base.  However, there is a nodular opacity at the left lung base which appears more prominent.  Metastatic lesion cannot be excluded, and follow-up is recommended.  The right lung is clear.  Healing of right posterior sixth rib fracture is noted. The heart is within normal limits in size.
IMPRESSION: Increasing nodular opacity at left lung base.  Cannot exclude metastatic lesion.   Stable scarring at the left base.

## 2007-07-26 IMAGING — CT CT PELVIS W/ CM
2 of 5 series · 16 of 46 positions shown, 18 images · IV contrast (omnipaque)
Comparison: 11/07/2006

CHEST CT WITH CONTRAST

CLINICAL DATA: Esophageal cancer
TECHNIQUE: Multidetector CT imaging of the chest, abdomen, and pelvis was
performed following the standard protocol during bolus administration of
intravenous contrast.

Contrast:  125 cc Omnipaque 300

[Series 2: cap 5.0 b40f · axial · 0.78mm/px · z∈[-677,-77]mm · 13 of 136 slices shown, 15 images]
[im 8/136  soft-tissue]
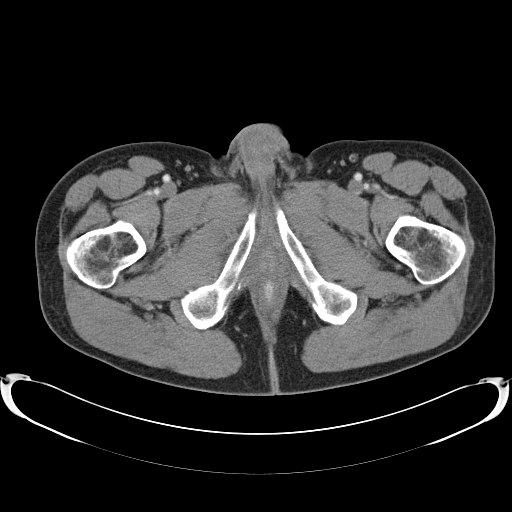
[im 8/136  bone]
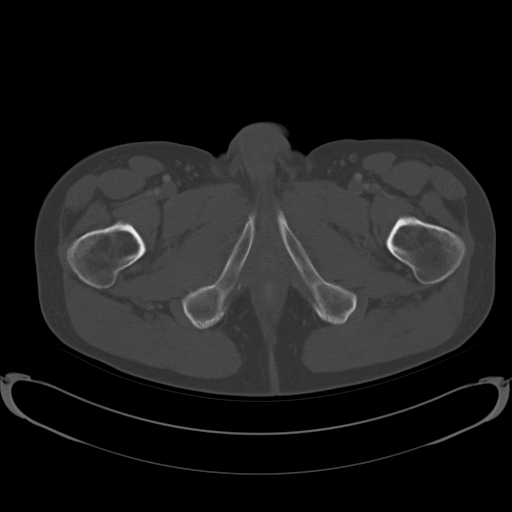
[im 22/136  soft-tissue]
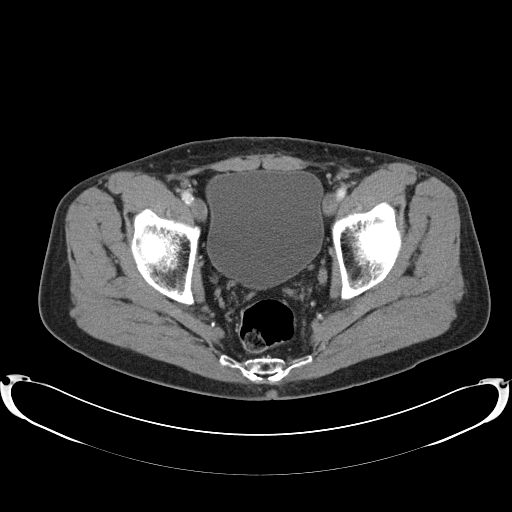
[im 29/136  soft-tissue]
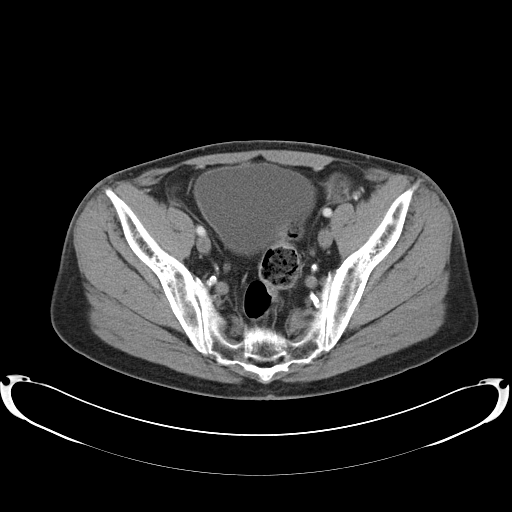
[im 36/136  soft-tissue]
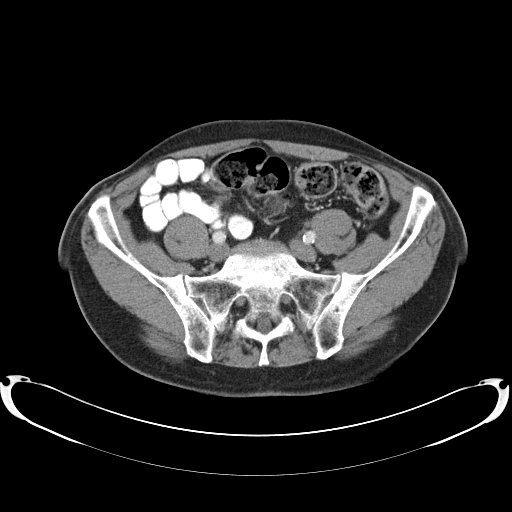
[im 50/136  soft-tissue]
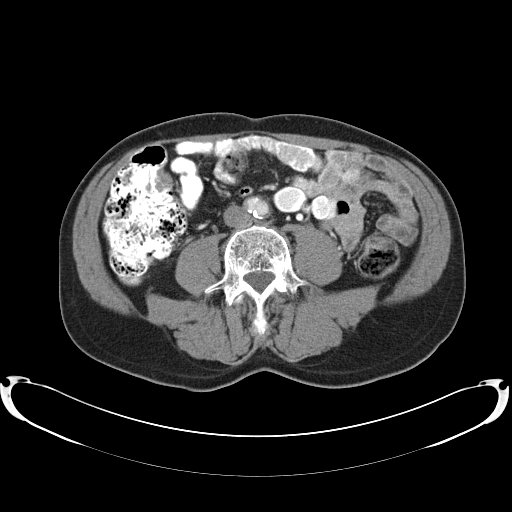
[im 57/136  soft-tissue]
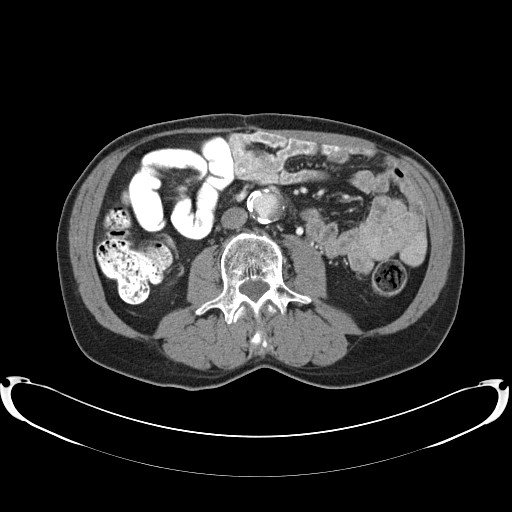
[im 72/136  soft-tissue]
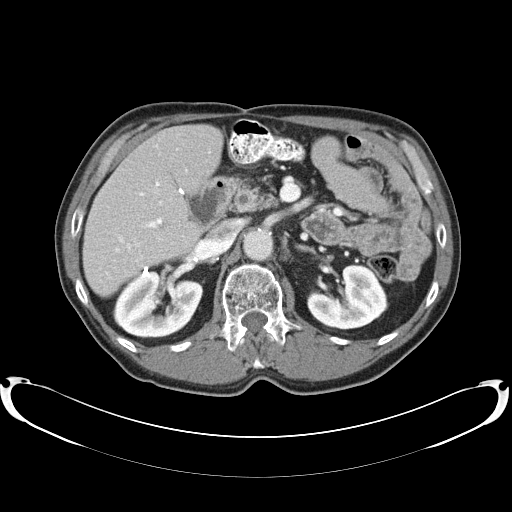
[im 79/136  soft-tissue]
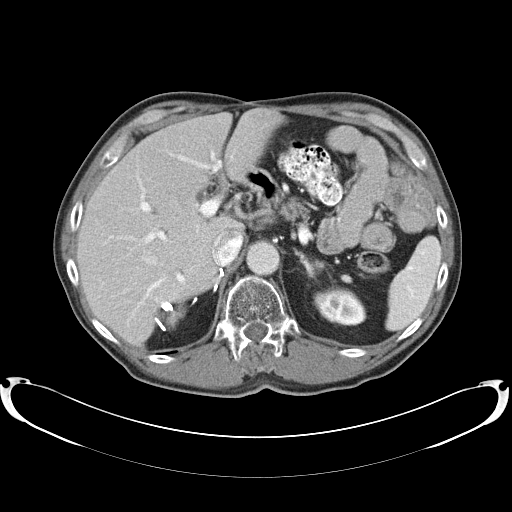
[im 86/136  soft-tissue]
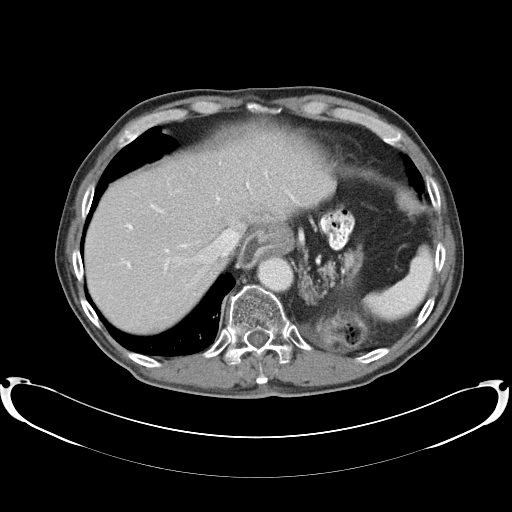
[im 86/136  bone]
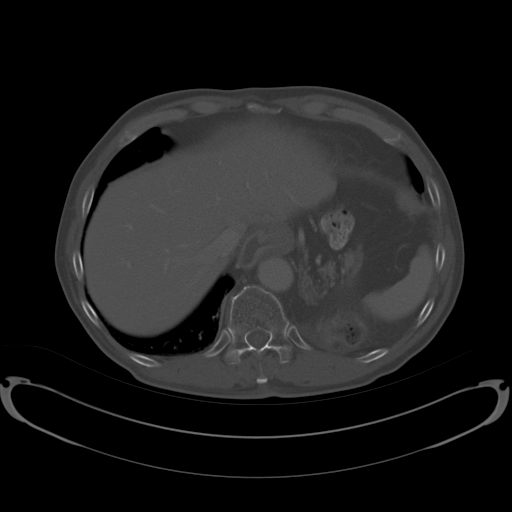
[im 100/136  soft-tissue]
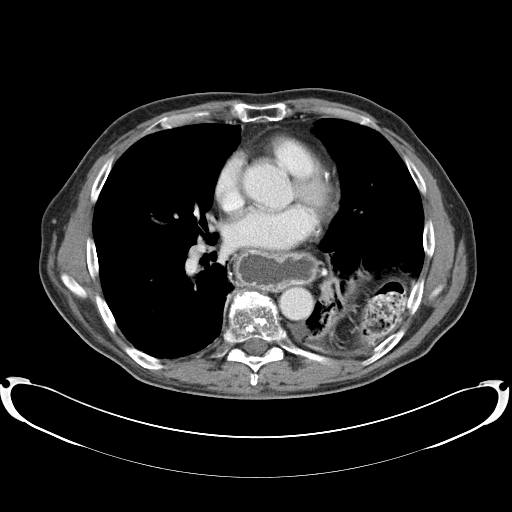
[im 107/136  soft-tissue]
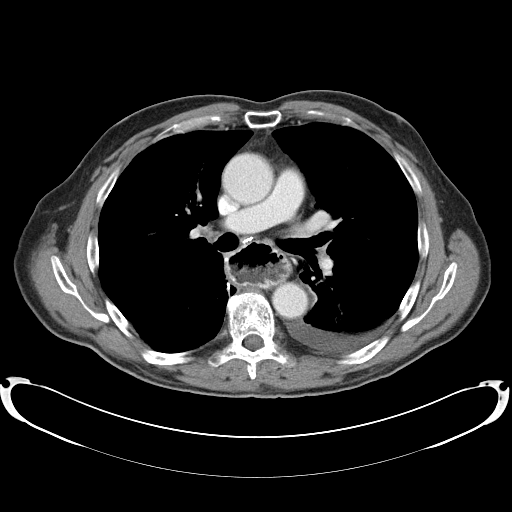
[im 114/136  soft-tissue]
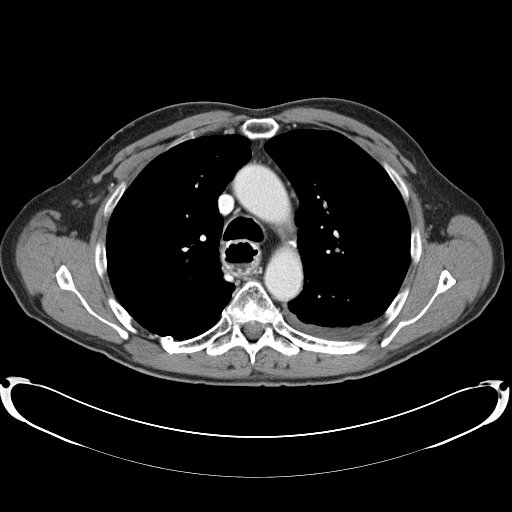
[im 128/136  soft-tissue]
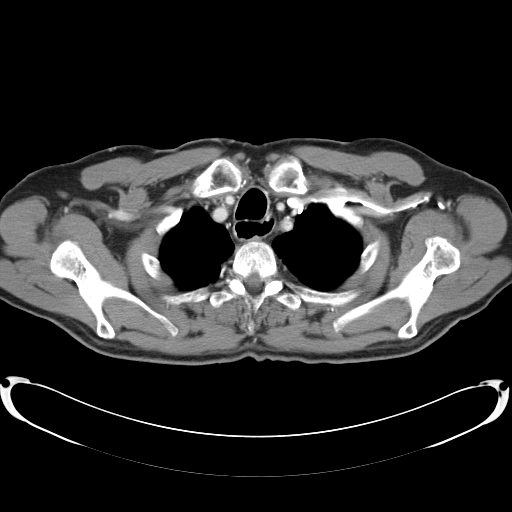

[Series 602: coronal images · coronal · 1.33mm/px · 3 of 79 slices shown]
[im 27/79  soft-tissue]
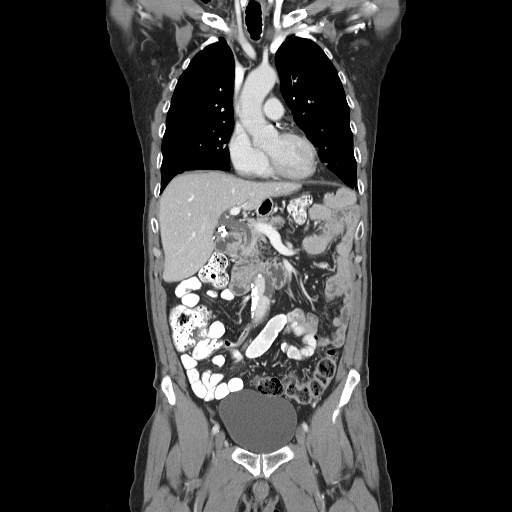
[im 35/79  soft-tissue]
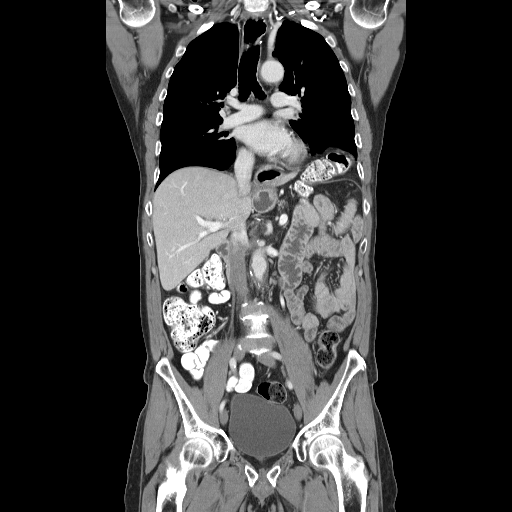
[im 44/79  soft-tissue]
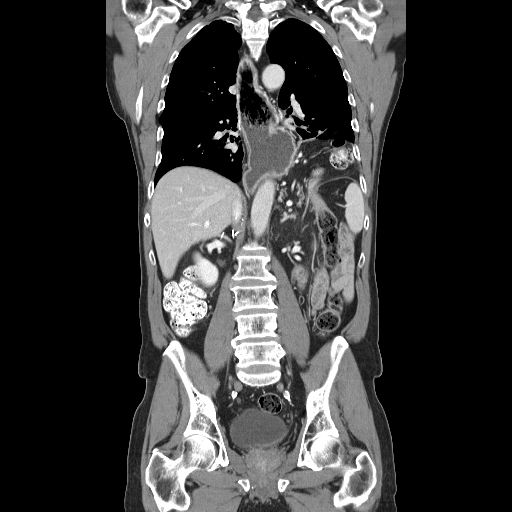

[16 of 46 positions shown; findings below may reference images not displayed]

FINDINGS: Again noted are postoperative changes from a esophagectomy with
gastric pull-through.

Negative for adenopathy.

Small left effusion unchanged.

The lungs are emphysematous.

Stable scarring within the lingula of the left lung.

No suspicious pulmonary nodules or masses are identified.

There is asymmetric elevation of the left hemidiaphragm with herniation of loops
of large bowel into the left hemithorax.

Post operative changes involving the right poster ribs noted. This is similar to
prior exam.

The bones are diffusely osteopenic.

There is no focal lytic or focal sclerotic lesions identified.

IMPRESSION

1. Stable postoperative changes.
2. No evidence for mass or adenopathy.

ABDOMEN CT WITH CONTRAST
FINDINGS: There is mild intrahepatic biliary ductal dilatation.

No focal liver lesions are identified.

The spleen is negative.

The left adrenal gland is negative.

Left kidney negative.

There is a cyst within the right kidney which is unchanged from prior exam. The
patient appears to be status post right adrenalectomy.

No adenopathy is identified.

Negative for free fluid or abnormal fluid collections.

The visualized bowel loops of the upper abdomen are unremarkable.

There is ectasia of the infrarenal abdominal aorta which measures 3.5 cm, image
76.  This is unchanged when compared to prior exam.

Review of the bone shows diffuse osteopenia. There is multilevel lumbar
spondylosis.

IMPRESSION

1. Stable CT of the upper abdomen.
2. Negative for mass or adenopathy 

PELVIS CT WITH CONTRAST

Urinary bladder is negative.

There is no adenopathy.

Negative for free fluid or abnormal fluid collections.

Sclerotic density within the left acetabulum is stable from prior exam likely
representing a benign bone island.

IMPRESSION

1. Negative for mass or adenopathy.

## 2007-08-15 ENCOUNTER — Ambulatory Visit: Payer: Self-pay | Admitting: Oncology

## 2007-08-15 ENCOUNTER — Inpatient Hospital Stay (HOSPITAL_COMMUNITY): Admission: EM | Admit: 2007-08-15 | Discharge: 2007-08-19 | Payer: Self-pay | Admitting: Emergency Medicine

## 2007-08-18 ENCOUNTER — Encounter (INDEPENDENT_AMBULATORY_CARE_PROVIDER_SITE_OTHER): Payer: Self-pay | Admitting: Gastroenterology

## 2007-08-27 ENCOUNTER — Ambulatory Visit: Payer: Self-pay | Admitting: Thoracic Surgery

## 2007-11-02 IMAGING — CR DG CHEST 1V PORT
1 series · 1 of 1 positions shown · non-contrast
Comparison: Comparison is made with 05/07/07.

CLINICAL DATA: Chest and abdominal pain. 
 PORTABLE CHEST - 1 VIEW ? 08/15/07 AT 4048 HOURS:

[view not recorded]
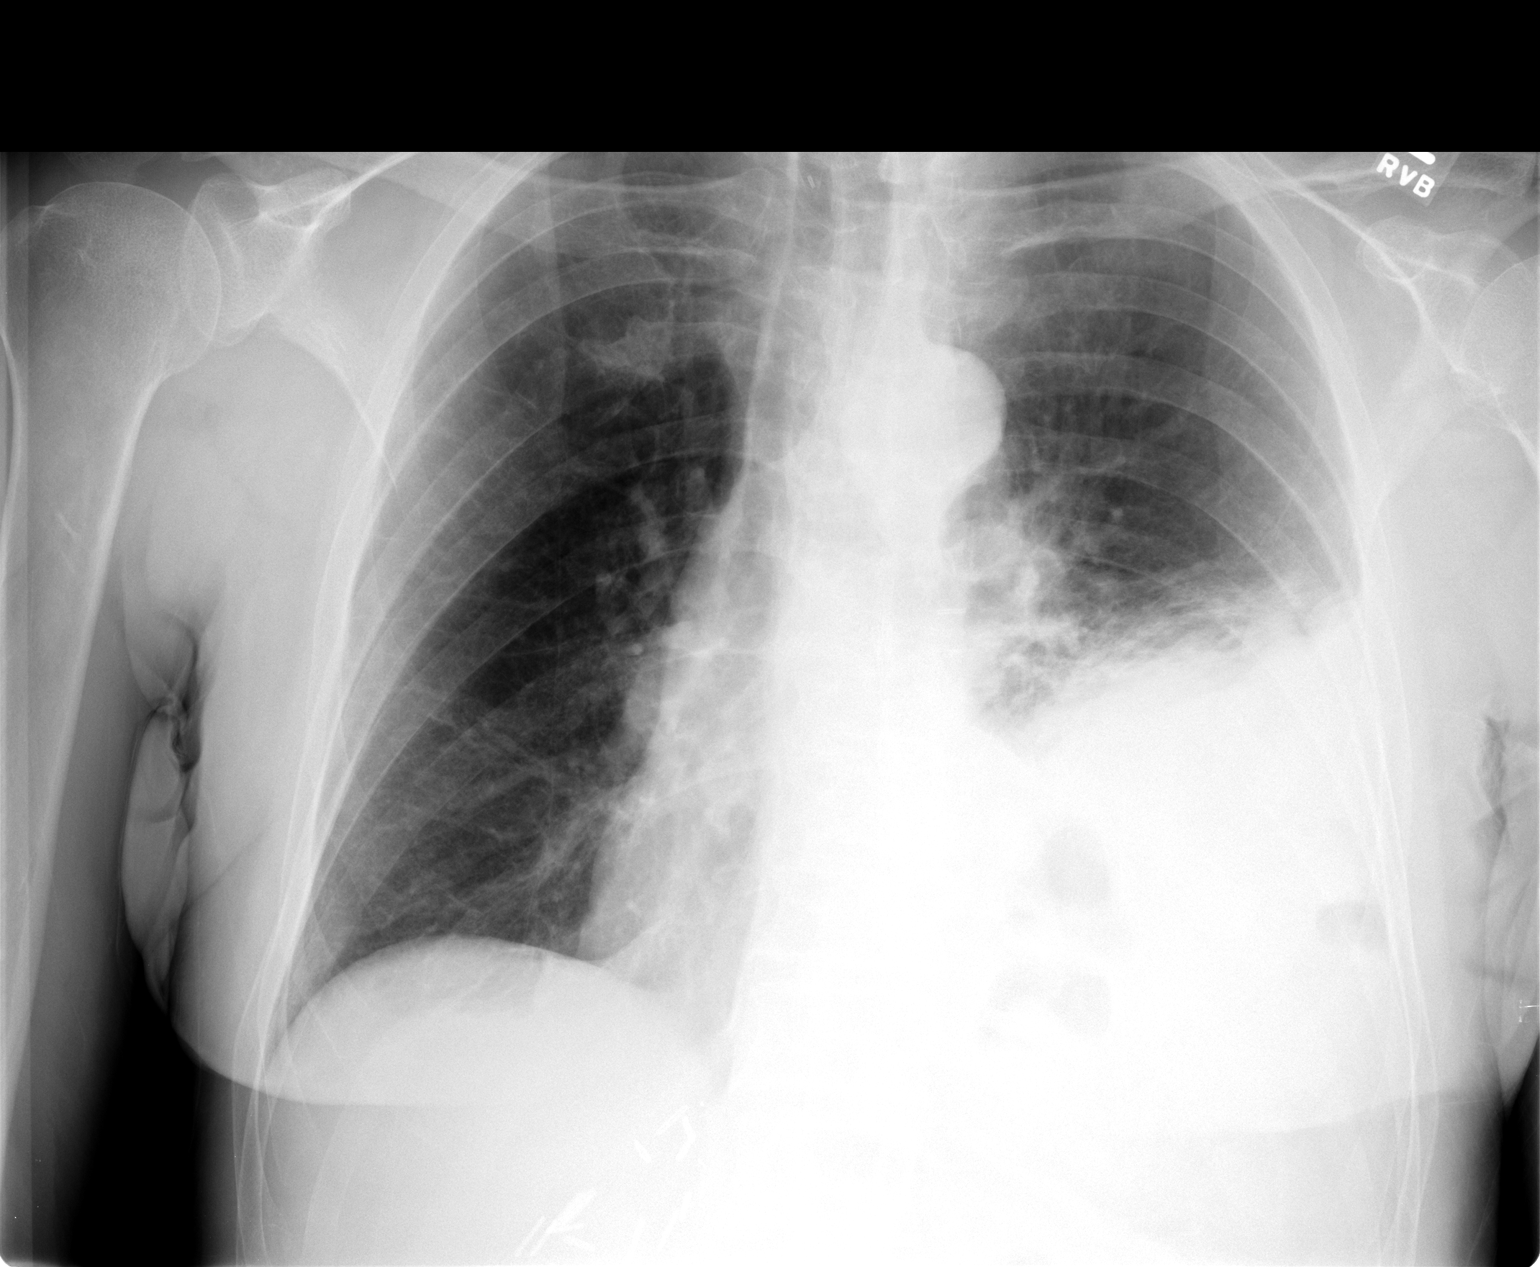

[1 of 1 positions shown; findings below may reference images not displayed]

FINDINGS: Elevated left hemidiaphragm is noted and may be due to a combination of atelectasis and possibly subpulmonic pleural effusion. Left lower lobar atelectasis/consolidation is likely present. The right lung is clear of an active process.
IMPRESSION: Left lower lobar atelectasis/consolidation and possible pleural effusion as well.

## 2007-11-02 IMAGING — CT CT PELVIS W/O CM
1 series · 15 of 32 positions shown, 19 images · non-contrast
Comparison: CT abdomen pelvis, [DATE]

CT Abdomen:

Addendum Begins
Addendum:

[Series 2: 130 stone 5.0 b40f st · axial · 0.69mm/px · z∈[-644,-208]mm · 15 of 121 slices shown, 19 images]
[im 8/121  soft-tissue]
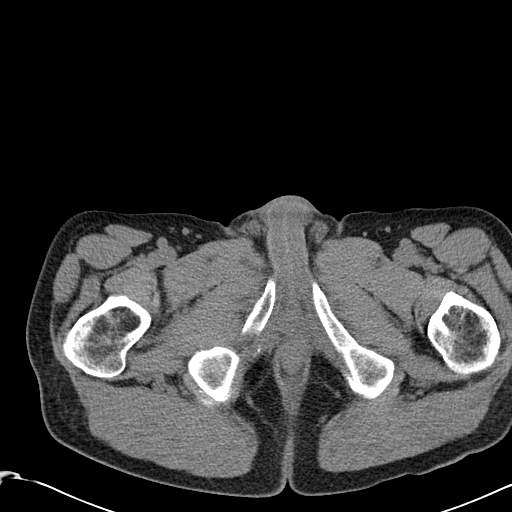
[im 8/121  bone]
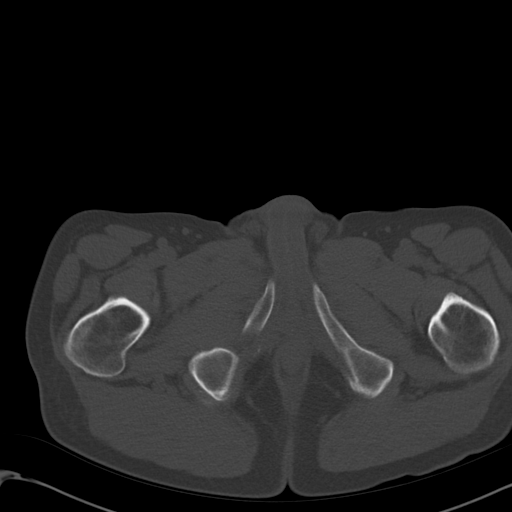
[im 16/121  soft-tissue]
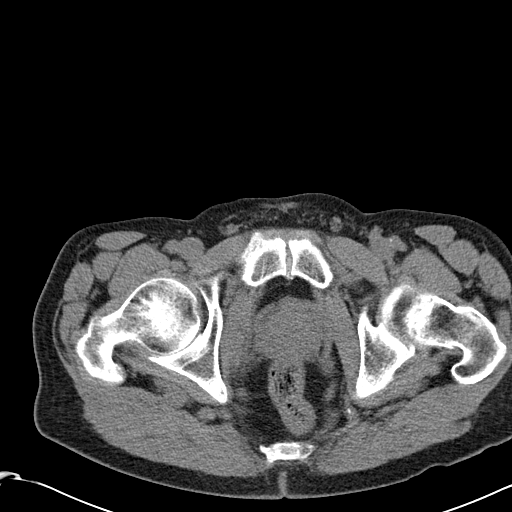
[im 24/121  soft-tissue]
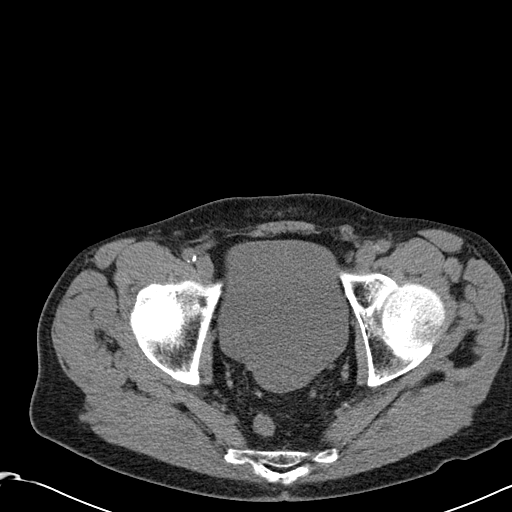
[im 35/121  soft-tissue]
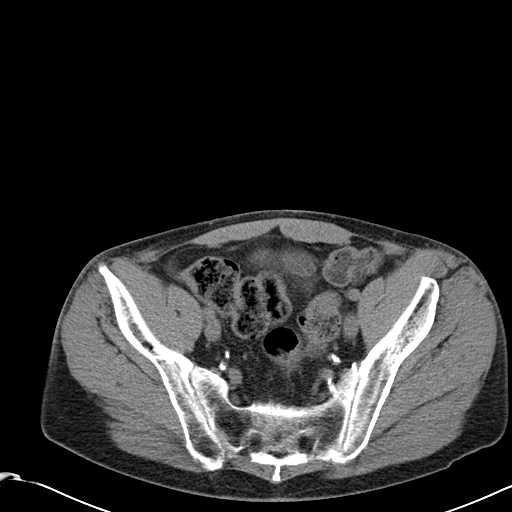
[im 43/121  soft-tissue]
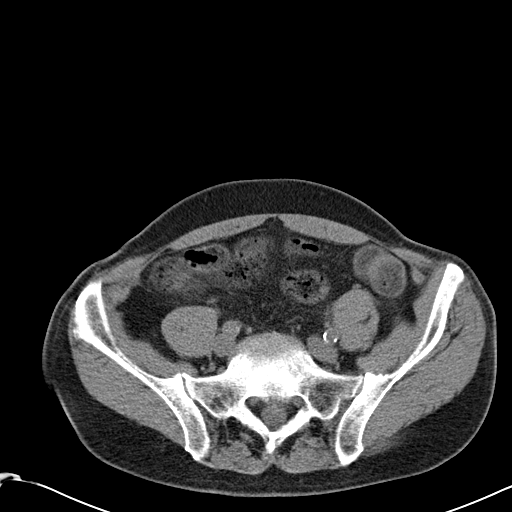
[im 51/121  soft-tissue]
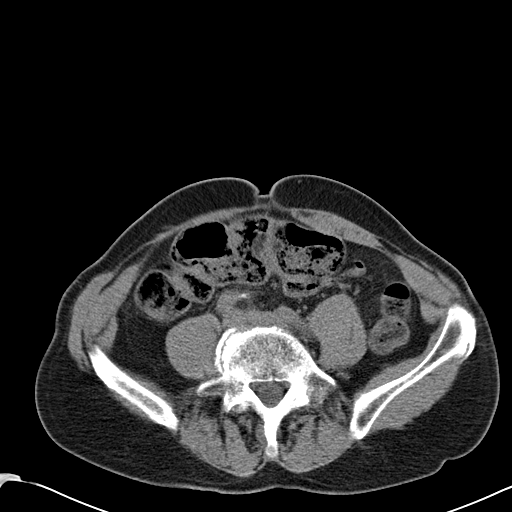
[im 62/121  soft-tissue]
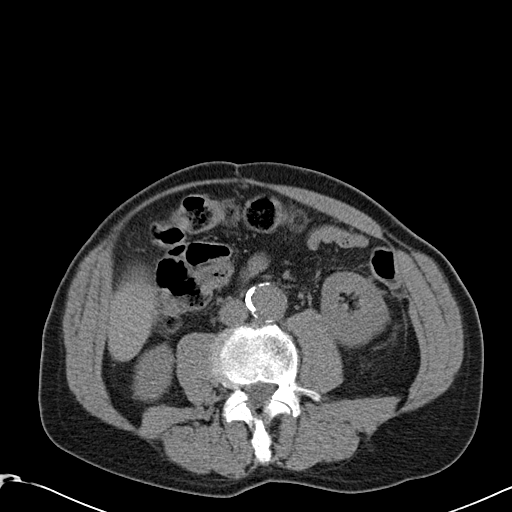
[im 70/121  soft-tissue]
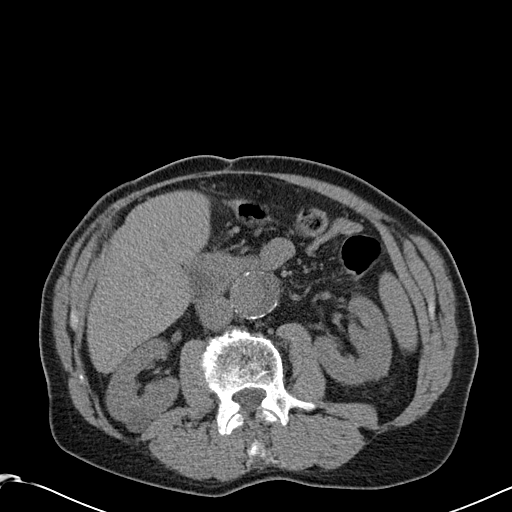
[im 78/121  soft-tissue]
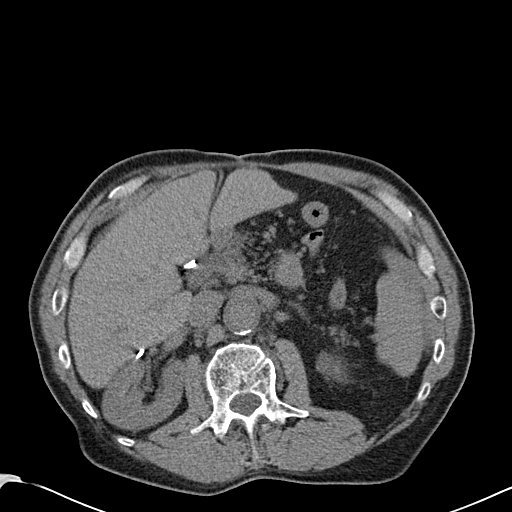
[im 78/121  bone]
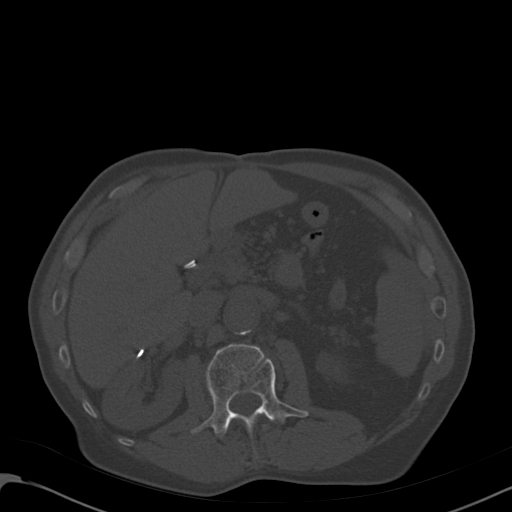
[im 86/121  soft-tissue]
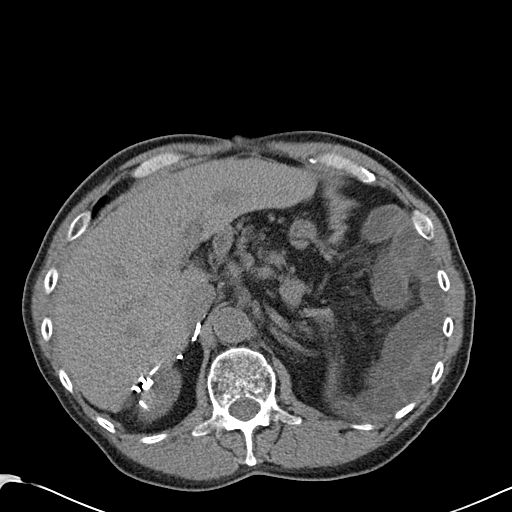
[im 97/121  soft-tissue]
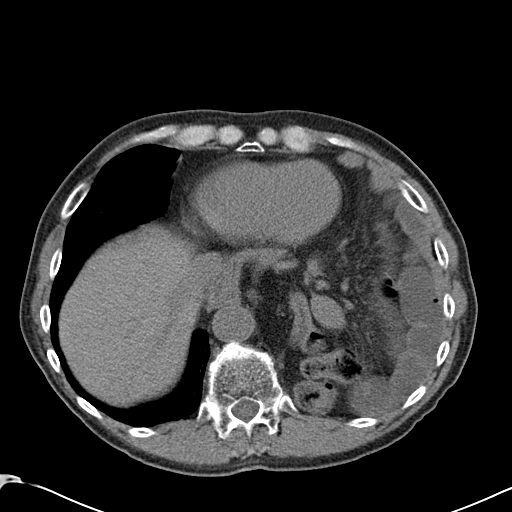
[im 105/121  soft-tissue]
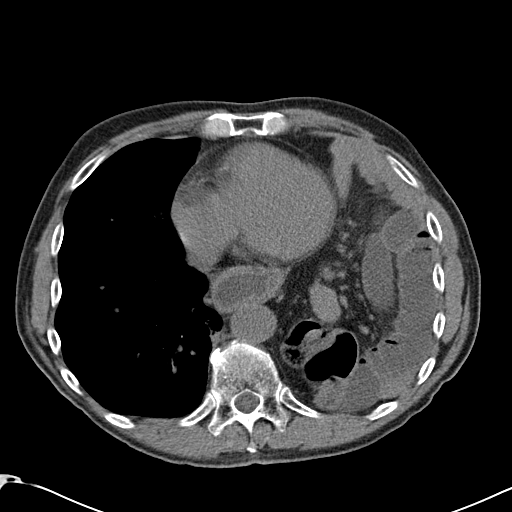
[im 105/121  lung]
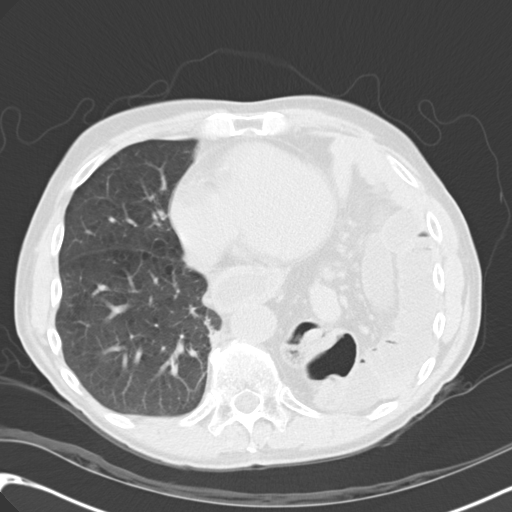
[im 109/121  lung]
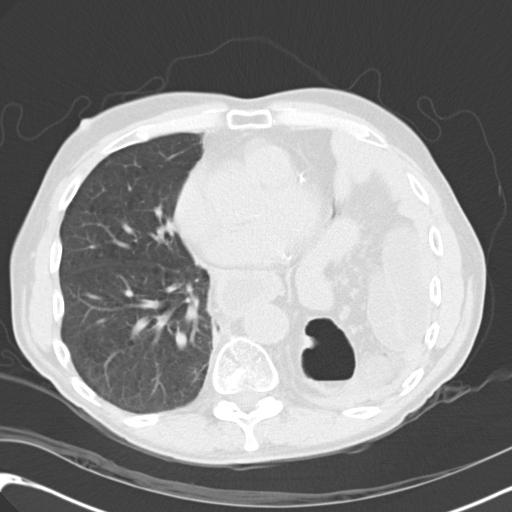
[im 113/121  soft-tissue]
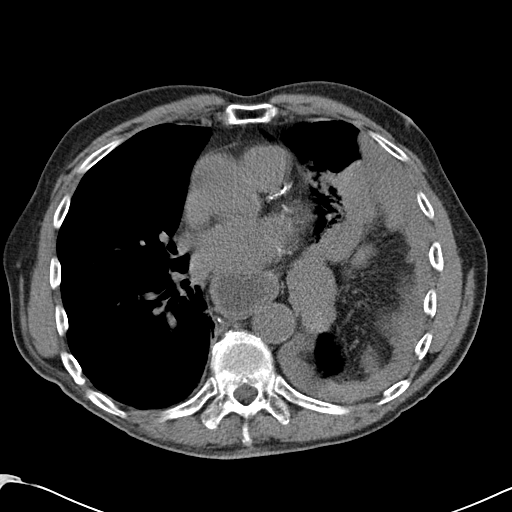
[im 113/121  lung]
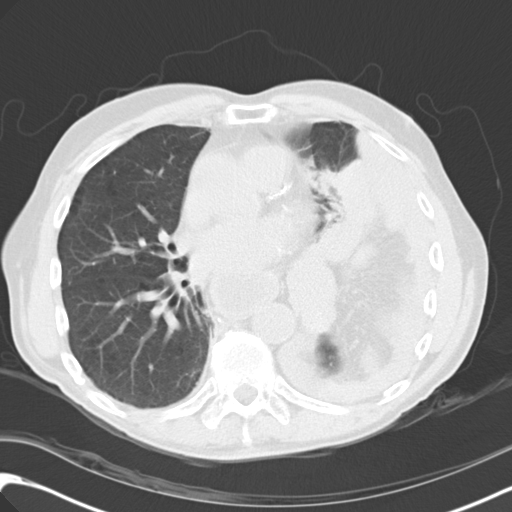
[im 117/121  lung]
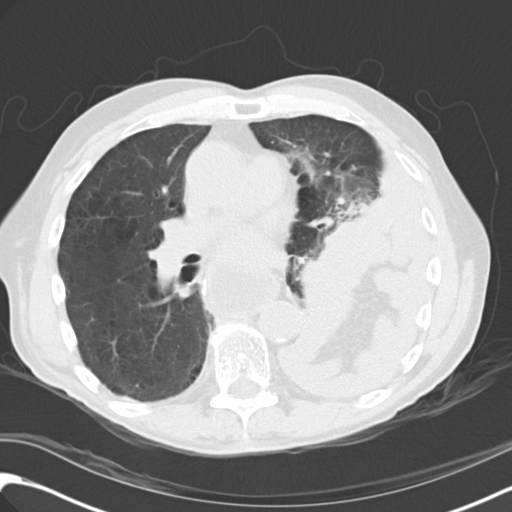

[15 of 32 positions shown; findings below may reference images not displayed]

FINDINGS: Under further inspection, the rind like thickening described below on
the undersurface of the left hemidiaphragm is felt to likely represent
unopacified small bowel on this noncontrast study.

Additional findings were conveyed to Dr. Taghazout and Dr. Nagme [REDACTED]
Addendum Ends
Clinical data abdominal pain

Exam: CT abdomen and pelvis without contrast:

Contiguous axial CT images were obtained from the lung bases through the pubic
symphysis.
FINDINGS: There is bronchiectasis at the left lung base unchanged from prior. Left
diaphragmatic hernia. Emphysematous change the bilateral lung bases.

There is a new rind of soft tissue nodular thickening along the inferior aspect
of the left hemidiaphragm/hernia which measures approximate 1.5 cm in thickness.
There are a cluster of nondilated loops of this fluid filled small bowel in the
left upper quadrant extending through the diaphragmatic hernia. Post surgical
changes consistent with esophagectomy. The liver, adrenal glands, and spleen
appear normal. The pancreas is fatty-replaced. Low density lesion in the midpole
right kidney too small to characterize.  Aneurysmal dilatation of abdominal
aorta to 30 mm unchanged.
IMPRESSION: 1. New rind like peritoneal thickening along the inferior surface of the left
hemidiaphragm/hernia which is very worrisome for metastasis.

CT pelvis:
 No free fluid in the pelvis. The bladder, prostate and rectum appear normal. No
evidence of bowel obstruction. Review of bone windows due to is no aggressive
osseous lesion.

Pressure:
1. No acute pelvic process.

## 2007-11-03 ENCOUNTER — Ambulatory Visit: Payer: Self-pay | Admitting: Oncology

## 2007-11-03 IMAGING — CR DG ABDOMEN 2V
3 series · 3 of 3 positions shown · non-contrast
Comparison: none

CLINICAL DATA: Abdominal pain.  Metastatic CA.   History of esophageal CA.
 ABDOMEN ? 3 VIEW:

[t abdomen supine (1 of 2)]
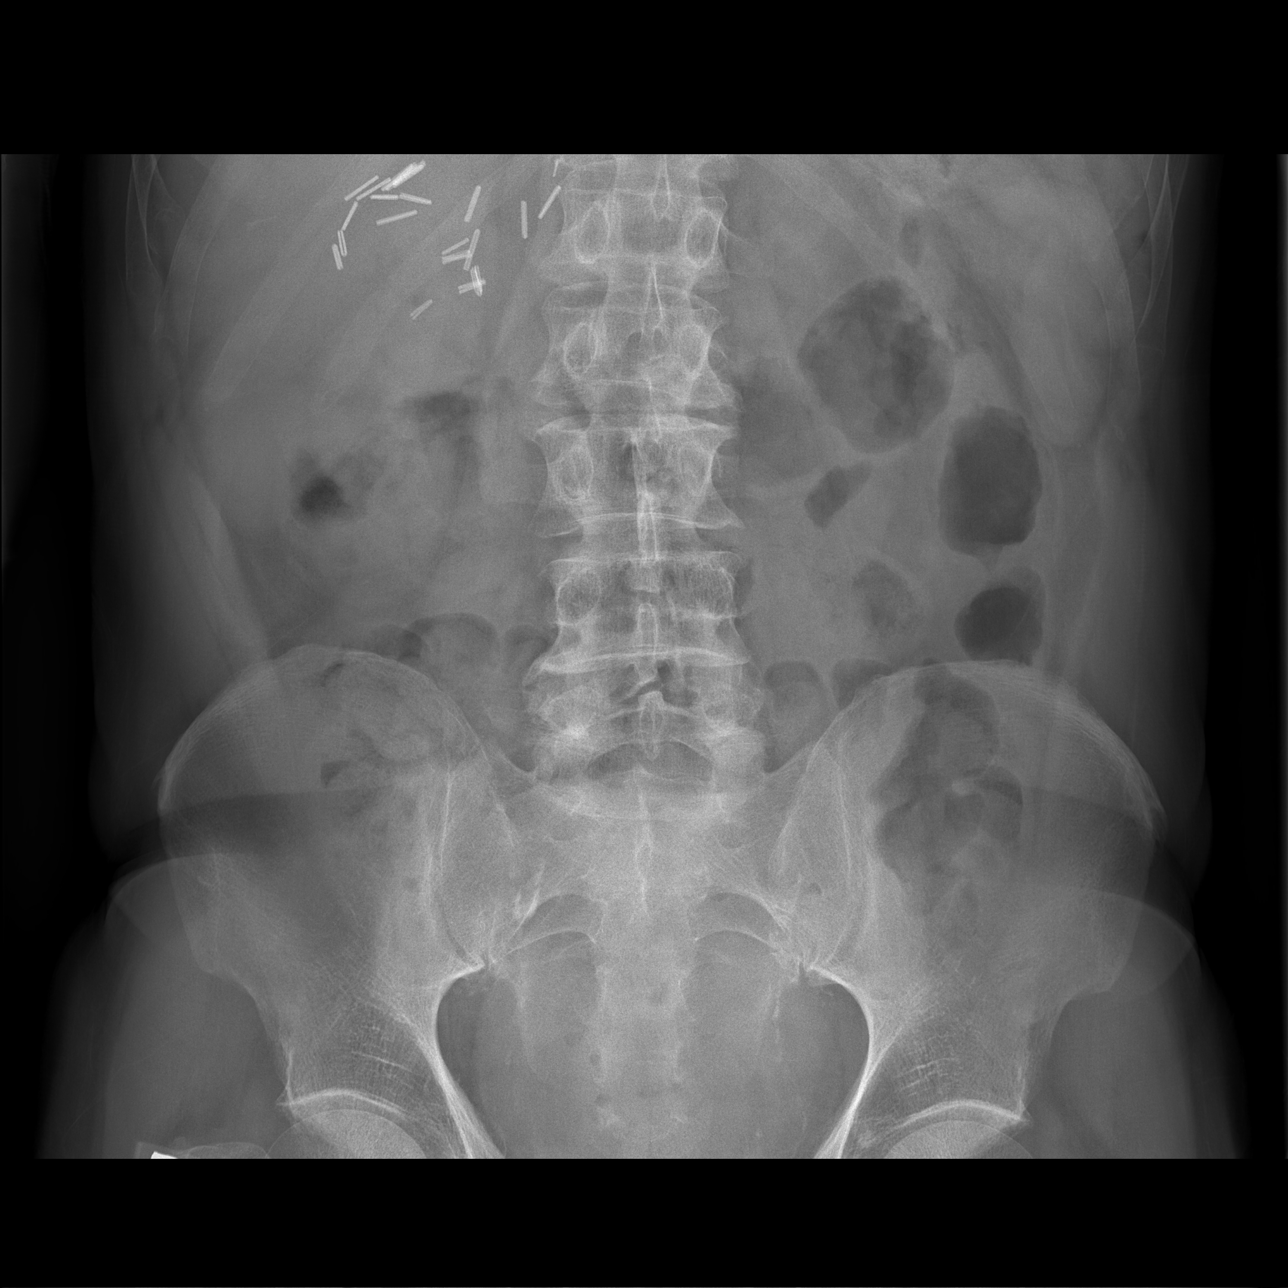

[t abdomen supine (2 of 2)]
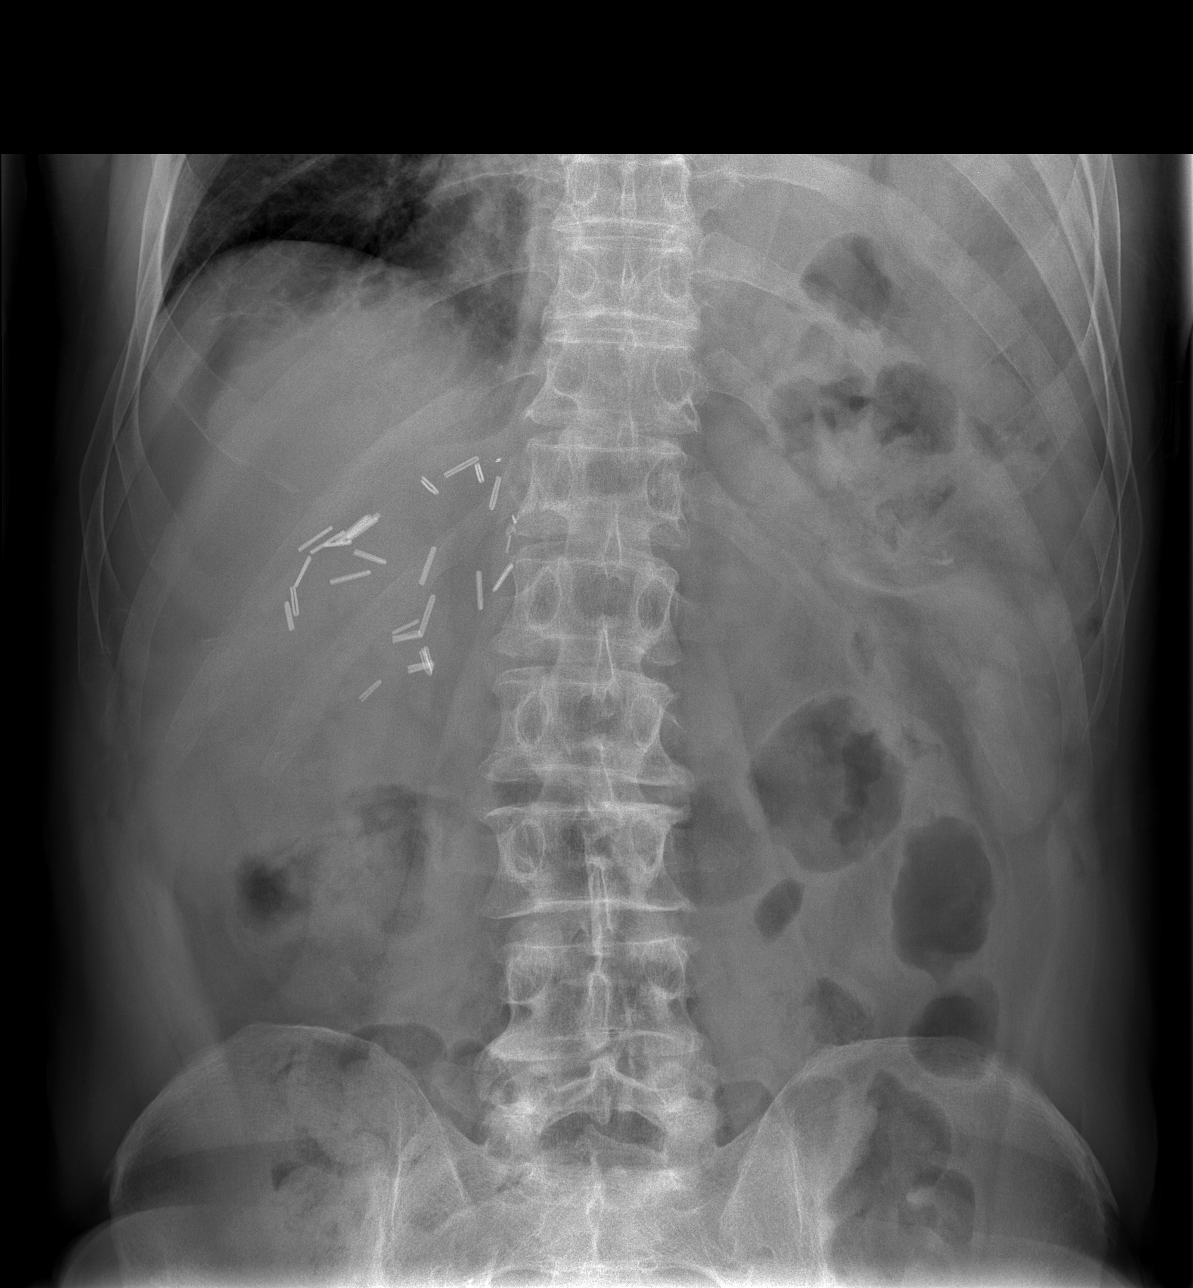

[view not recorded]
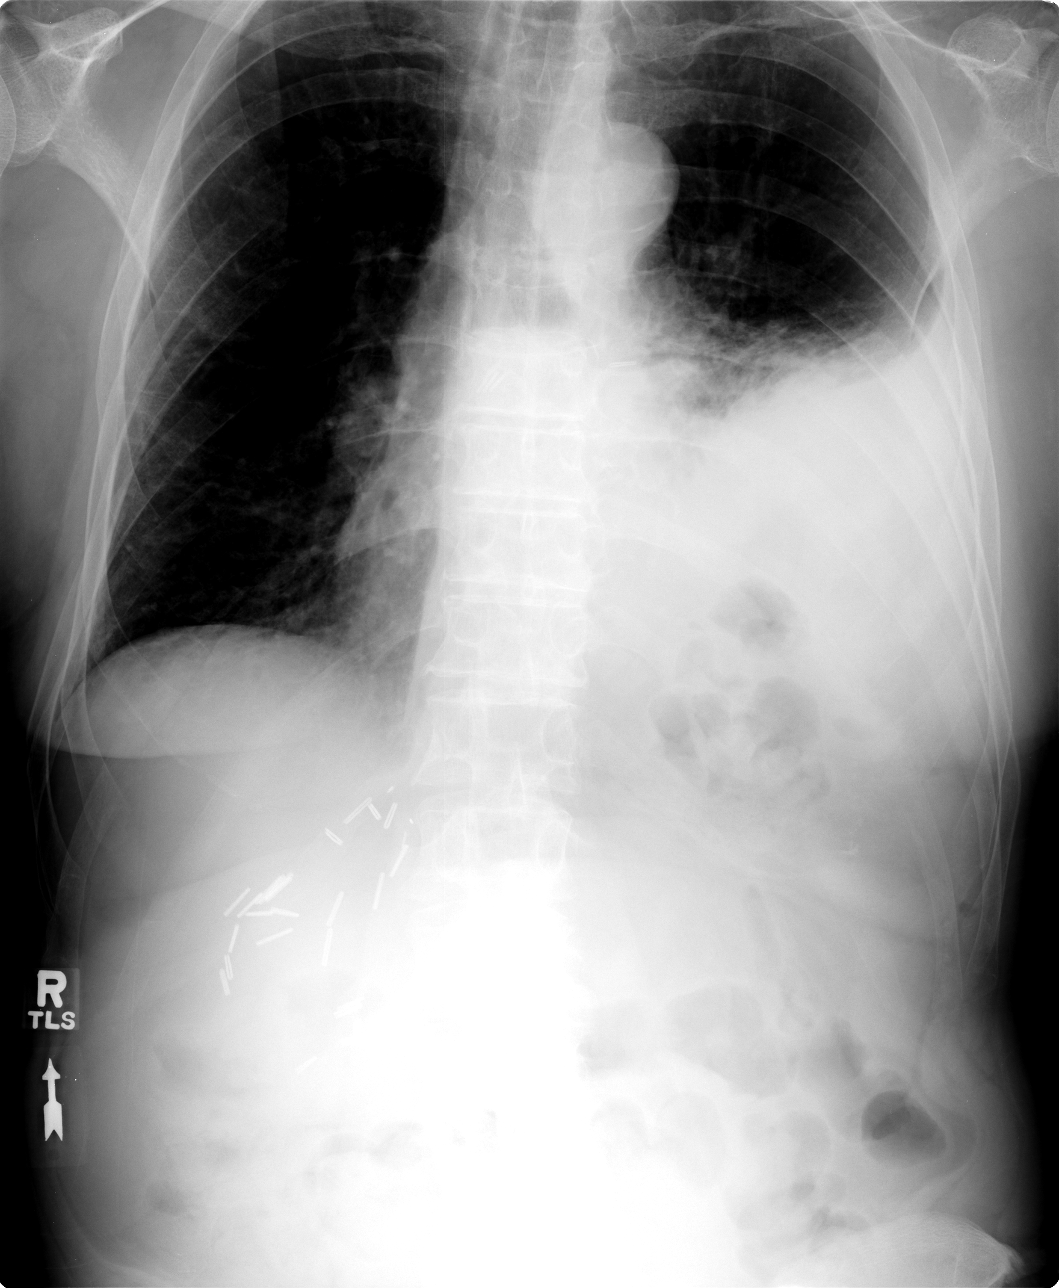

[3 of 3 positions shown; findings below may reference images not displayed]

FINDINGS: Negative for bowel obstruction.  Elevated left hemidiaphragm.  Increased density in the thoracoabdominal region left upper quadrant.  Atelectasis at the left base plus small left pleural effusion.
IMPRESSION: Negative for bowel obstruction.  Suspicion for left subdiaphragmatic process possibly tumor with elevation of the left hemidiaphragm.  Left base atelectasis/infiltrate.  Small left pleural effusion.

## 2007-11-10 ENCOUNTER — Ambulatory Visit (HOSPITAL_COMMUNITY): Admission: RE | Admit: 2007-11-10 | Discharge: 2007-11-10 | Payer: Self-pay | Admitting: Hematology & Oncology

## 2007-11-10 LAB — CBC WITH DIFFERENTIAL/PLATELET
BASO%: 0.7 % (ref 0.0–2.0)
HCT: 40.5 % (ref 38.7–49.9)
HGB: 13.8 g/dL (ref 13.0–17.1)
MCHC: 34 g/dL (ref 32.0–35.9)
MONO#: 0.4 10*3/uL (ref 0.1–0.9)
NEUT%: 56.6 % (ref 40.0–75.0)
WBC: 4.3 10*3/uL (ref 4.0–10.0)
lymph#: 1.2 10*3/uL (ref 0.9–3.3)

## 2007-11-10 LAB — COMPREHENSIVE METABOLIC PANEL
ALT: 30 U/L (ref 0–53)
Albumin: 3.3 g/dL — ABNORMAL LOW (ref 3.5–5.2)
CO2: 29 mEq/L (ref 19–32)
Calcium: 8.9 mg/dL (ref 8.4–10.5)
Chloride: 104 mEq/L (ref 96–112)
Creatinine, Ser: 0.89 mg/dL (ref 0.40–1.50)
Potassium: 3.6 mEq/L (ref 3.5–5.3)
Total Protein: 6.4 g/dL (ref 6.0–8.3)

## 2007-11-25 ENCOUNTER — Ambulatory Visit: Payer: Self-pay | Admitting: Thoracic Surgery

## 2007-11-25 ENCOUNTER — Encounter: Admission: RE | Admit: 2007-11-25 | Discharge: 2007-11-25 | Payer: Self-pay | Admitting: Thoracic Surgery

## 2008-01-28 IMAGING — CT CT PELVIS W/ CM
2 of 5 series · 16 of 46 positions shown, 18 images · IV contrast (omnipaque)
Comparison: Abdominal/pelvic CT 08/15/2007 and chest CT 05/08/2007

CLINICAL DATA: recurrent esophageal cancer. Initially diagnosed [DATE].
Chemotherapy and radiation therapy completed.
TECHNIQUE: Multidetector CT imaging of the chest, abdomen, and pelvis was
performed following the standard protocol during bolus administration of
intravenous contrast.

Contrast:  100 cc Omnipaque 300
CHEST CT WITH CONTRAST

[Series 2: cap 5.0 b40f · axial · 0.68mm/px · z∈[-685,-95]mm · 13 of 134 slices shown, 15 images]
[im 8/134  soft-tissue]
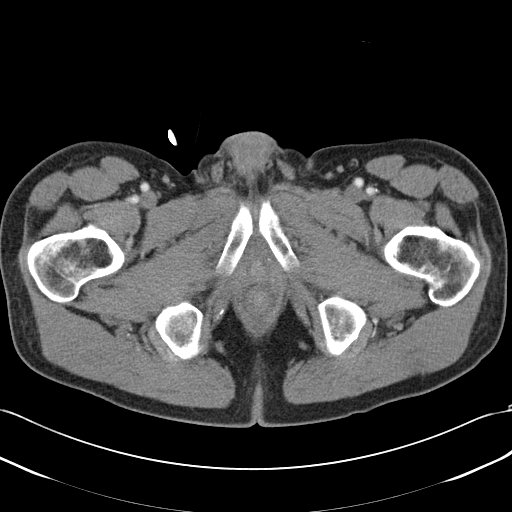
[im 8/134  bone]
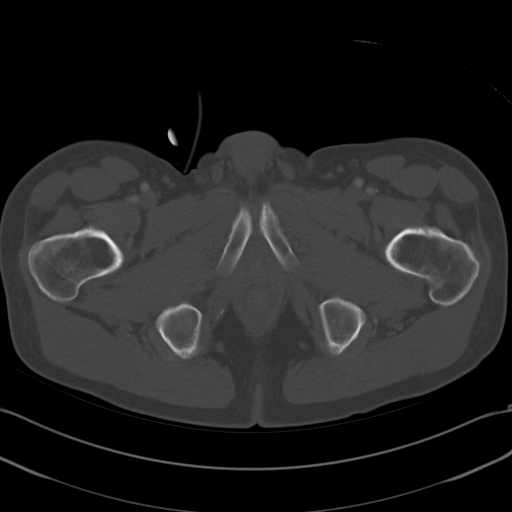
[im 15/134  soft-tissue]
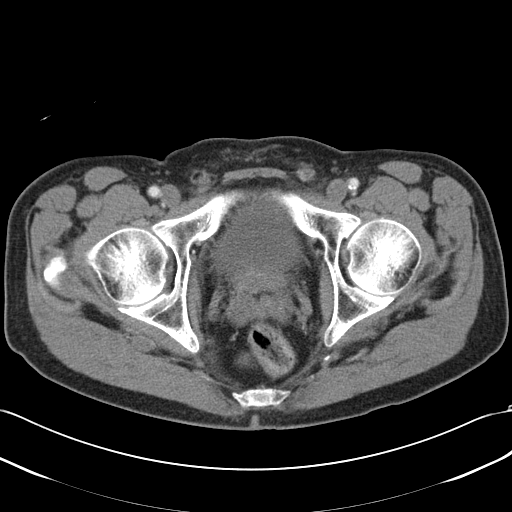
[im 30/134  soft-tissue]
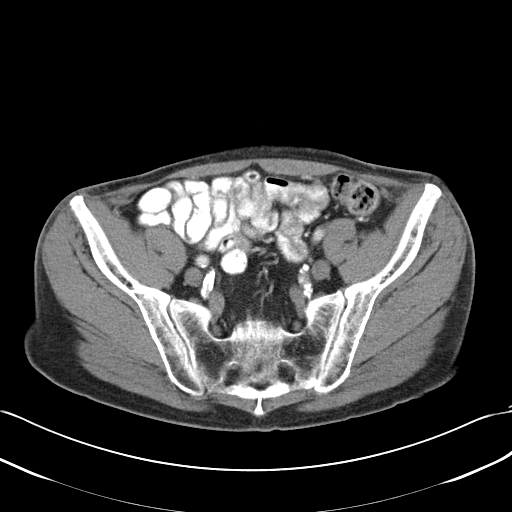
[im 37/134  soft-tissue]
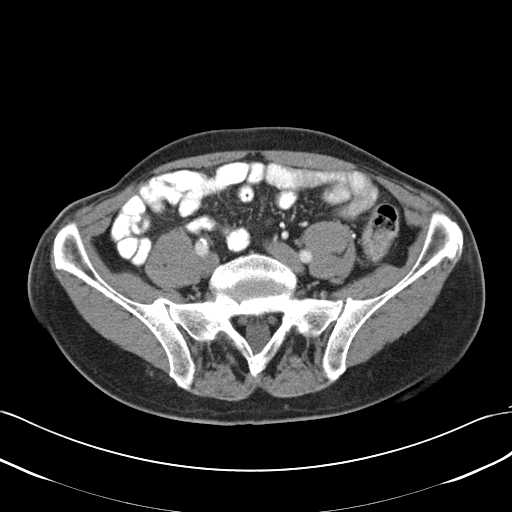
[im 45/134  soft-tissue]
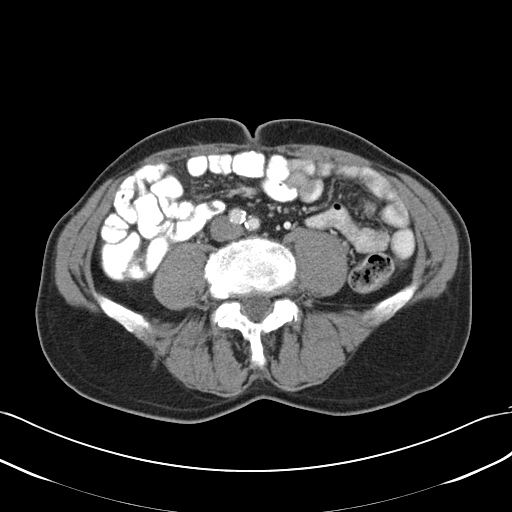
[im 60/134  soft-tissue]
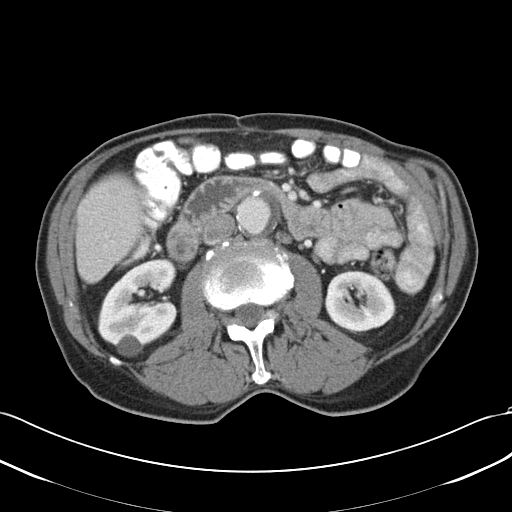
[im 67/134  soft-tissue]
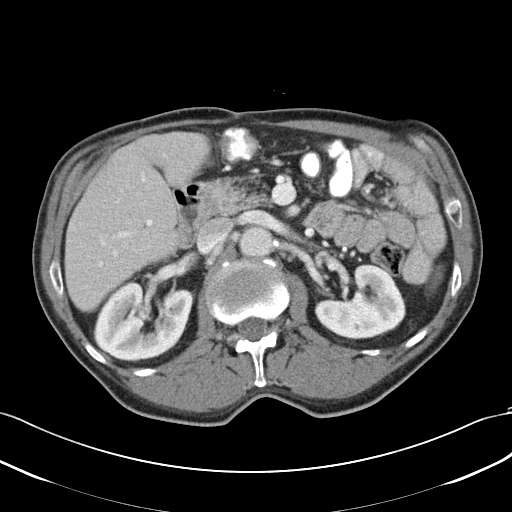
[im 74/134  soft-tissue]
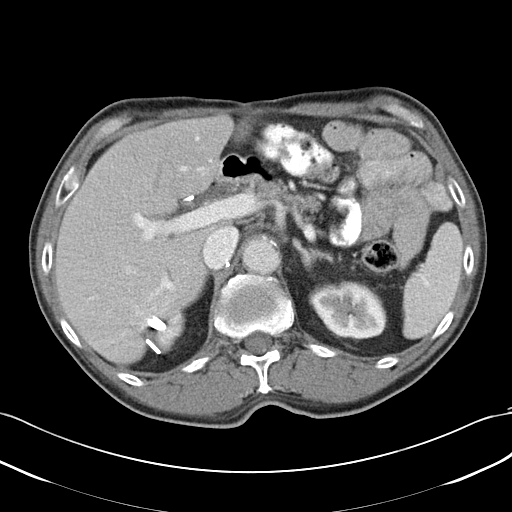
[im 89/134  soft-tissue]
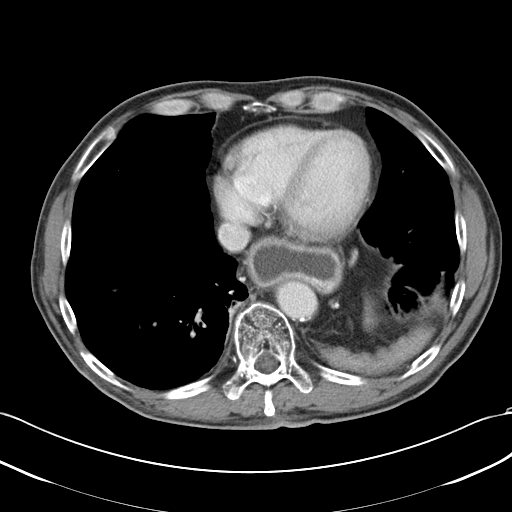
[im 89/134  bone]
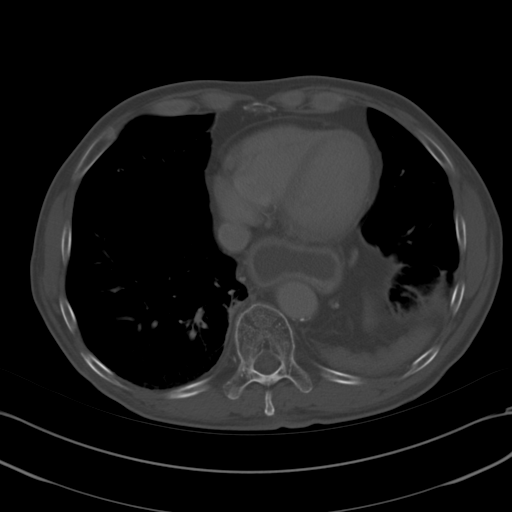
[im 97/134  soft-tissue]
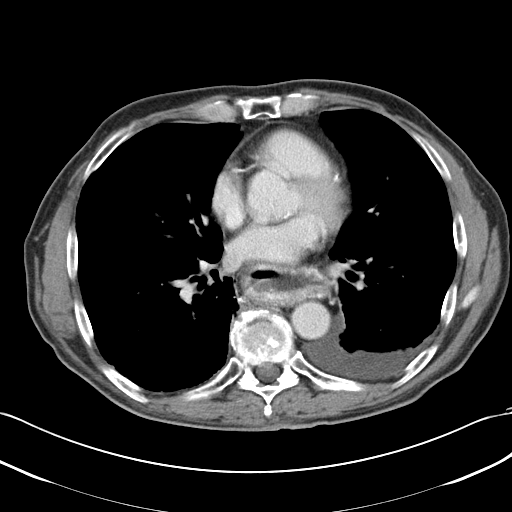
[im 104/134  soft-tissue]
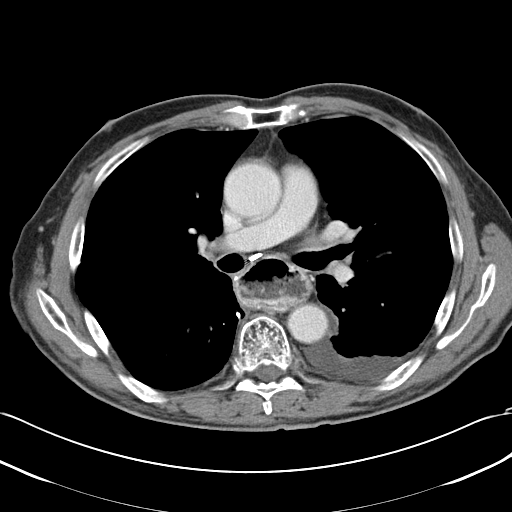
[im 119/134  soft-tissue]
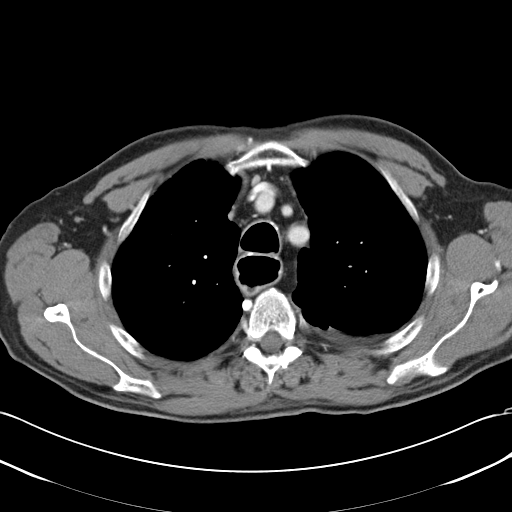
[im 126/134  soft-tissue]
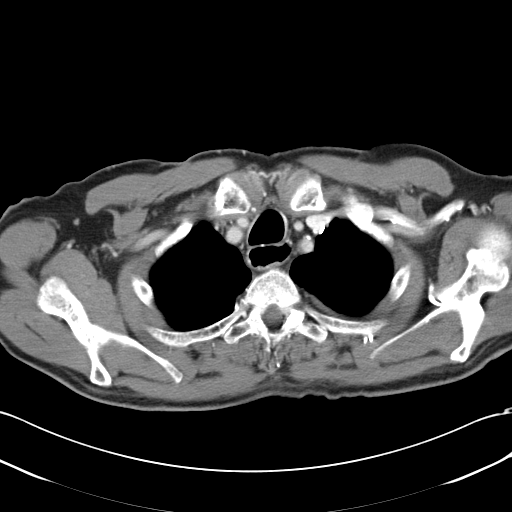

[Series 602: <mpr thick range> · coronal · 1.30mm/px · 3 of 72 slices shown]
[im 24/72  soft-tissue]
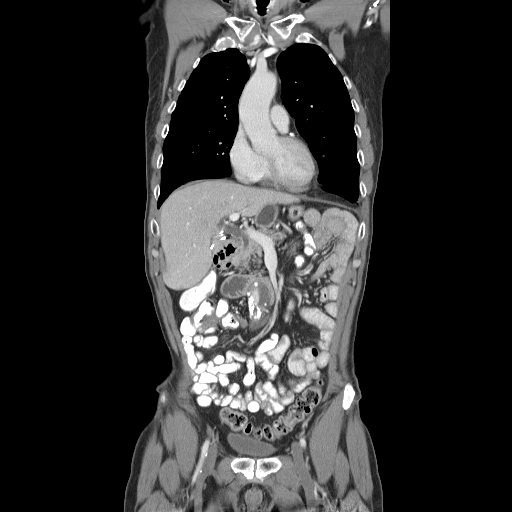
[im 32/72  soft-tissue]
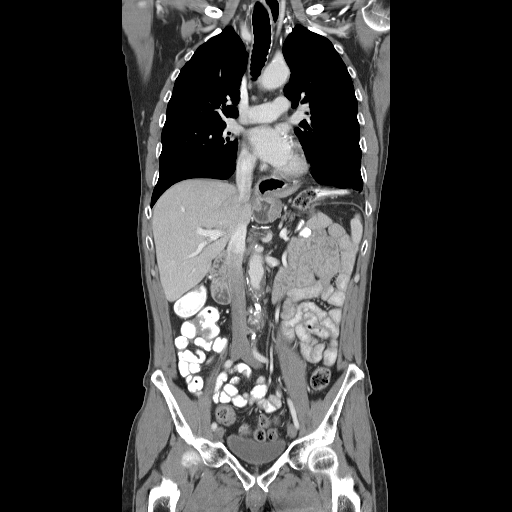
[im 40/72  soft-tissue]
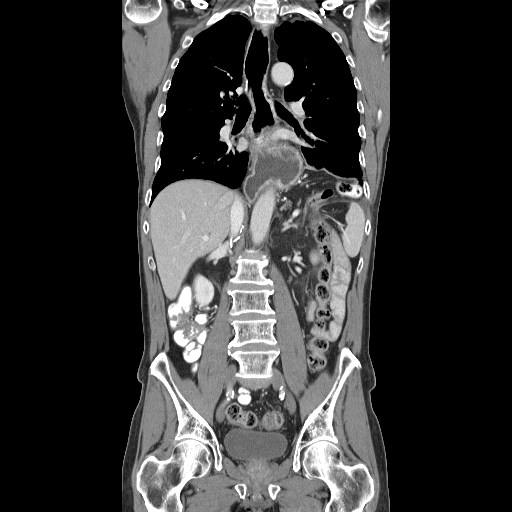

[16 of 46 positions shown; findings below may reference images not displayed]

FINDINGS: Lung windows demonstrate moderate centrilobular emphysema. Persistent
volume loss left lung base with elevation of the left hemidiaphragm and adjacent
subsegmental atelectasis. No nodules.

Soft tissue windows demonstrate normal heart size without pericardial effusion.
Advanced coronary artery atherosclerosis. No right-sided pleural effusion. Small
left-sided pleural effusion is similar to [DATE]. Cannot exclude subtle
irregularity of the visceral pleura on image 22.

Left-sided diaphragmatic hernia containing nonobstructive large bowel within.

No mediastinal or hilar adenopathy. Status post gastric pull-through.

IMPRESSION

1. Similar appearance of the chest since 04/18/2007.
2. Postoperative changes of gastric pull-through without evidence of metastatic
disease. Left diaphragmatic hernia containing nonobstructive colon.
3. Persistent small left pleural effusion. Minimal pleural irregularity cannot
be excluded. Attention on followup.

ABDOMEN CT WITH CONTRAST
FINDINGS: Surgical changes likely of right adrenal resection. Cholecystectomy.
No focal liver or splenic lesions. Distal stomach within normal limits.
Moderately atrophic pancreas. Normal biliary tract, left adrenal gland and left
kidney. Right renal cyst.

Infrarenal abdominal aortic ectasia 3.6 cm, similar to the prior. No acute
complication. No extension into the iliacs.

Small stable retroperitoneal lymph nodes. No adenopathy.

Soft tissue fullness of the cecum and ileocecal valve on images 83-87. No
ascites.

IMPRESSION

1. No evidence of metastatic disease within the abdomen.
2. Stable infrarenal abdominal ectasia.
3. Soft tissue fullness at the cecum and ileocecal region. Correlate with any
gastrointestinal symptoms. Consider correlation with colonoscopy, if not
recently performed.

PELVIS CT WITH CONTRAST
FINDINGS: Normal pelvic bowel loops. Normal urinary bladder and prostate.
Normal bones.

IMPRESSION

No acute pelvic process or metastatic disease.

## 2008-02-12 IMAGING — CR DG CHEST 2V
2 series · 2 of 2 positions shown · non-contrast
Comparison: Chest 08/15/07

CLINICAL DATA: Esophageal carcinoma. 
 CHEST - 2 VIEW:

[w chest pa]
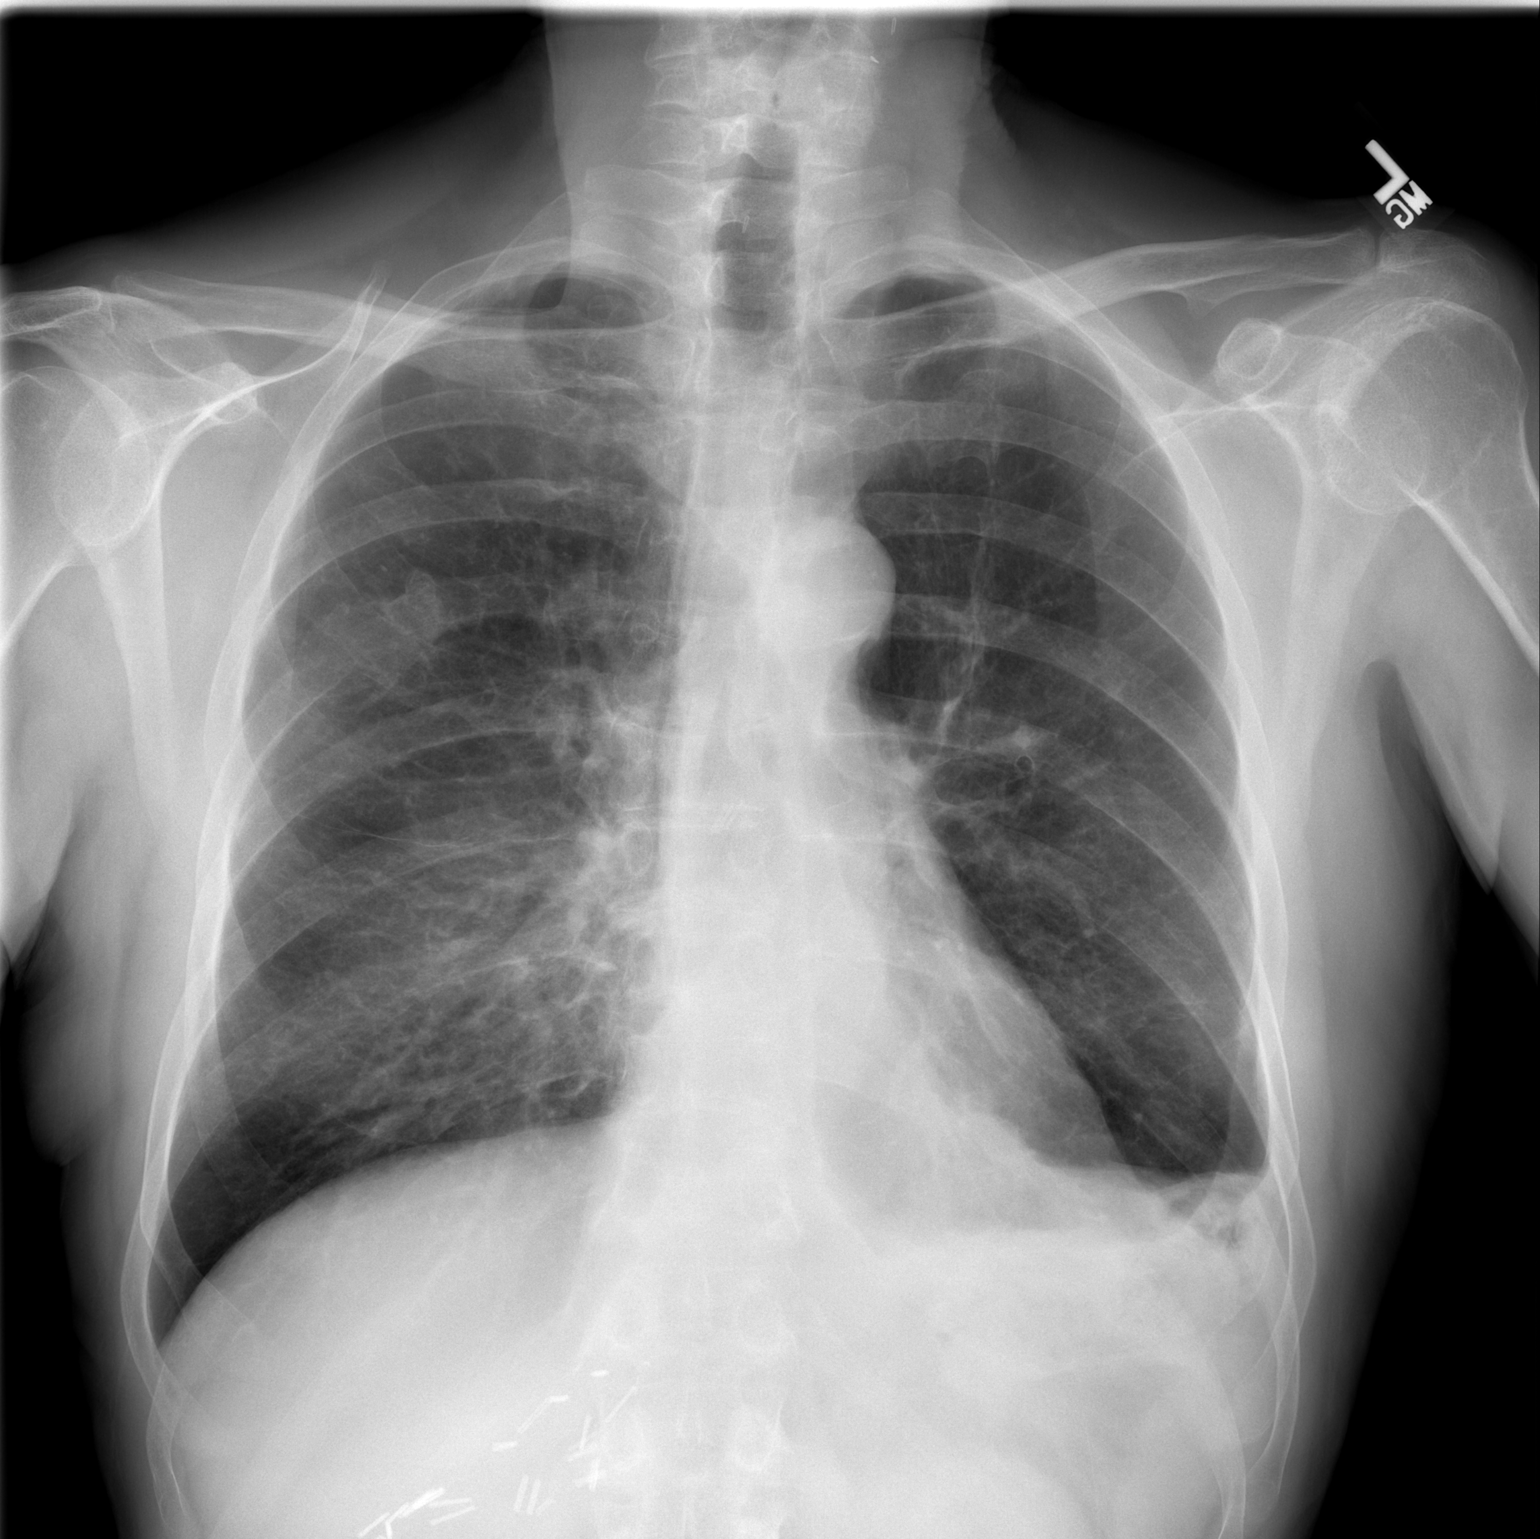

[w chest lat]
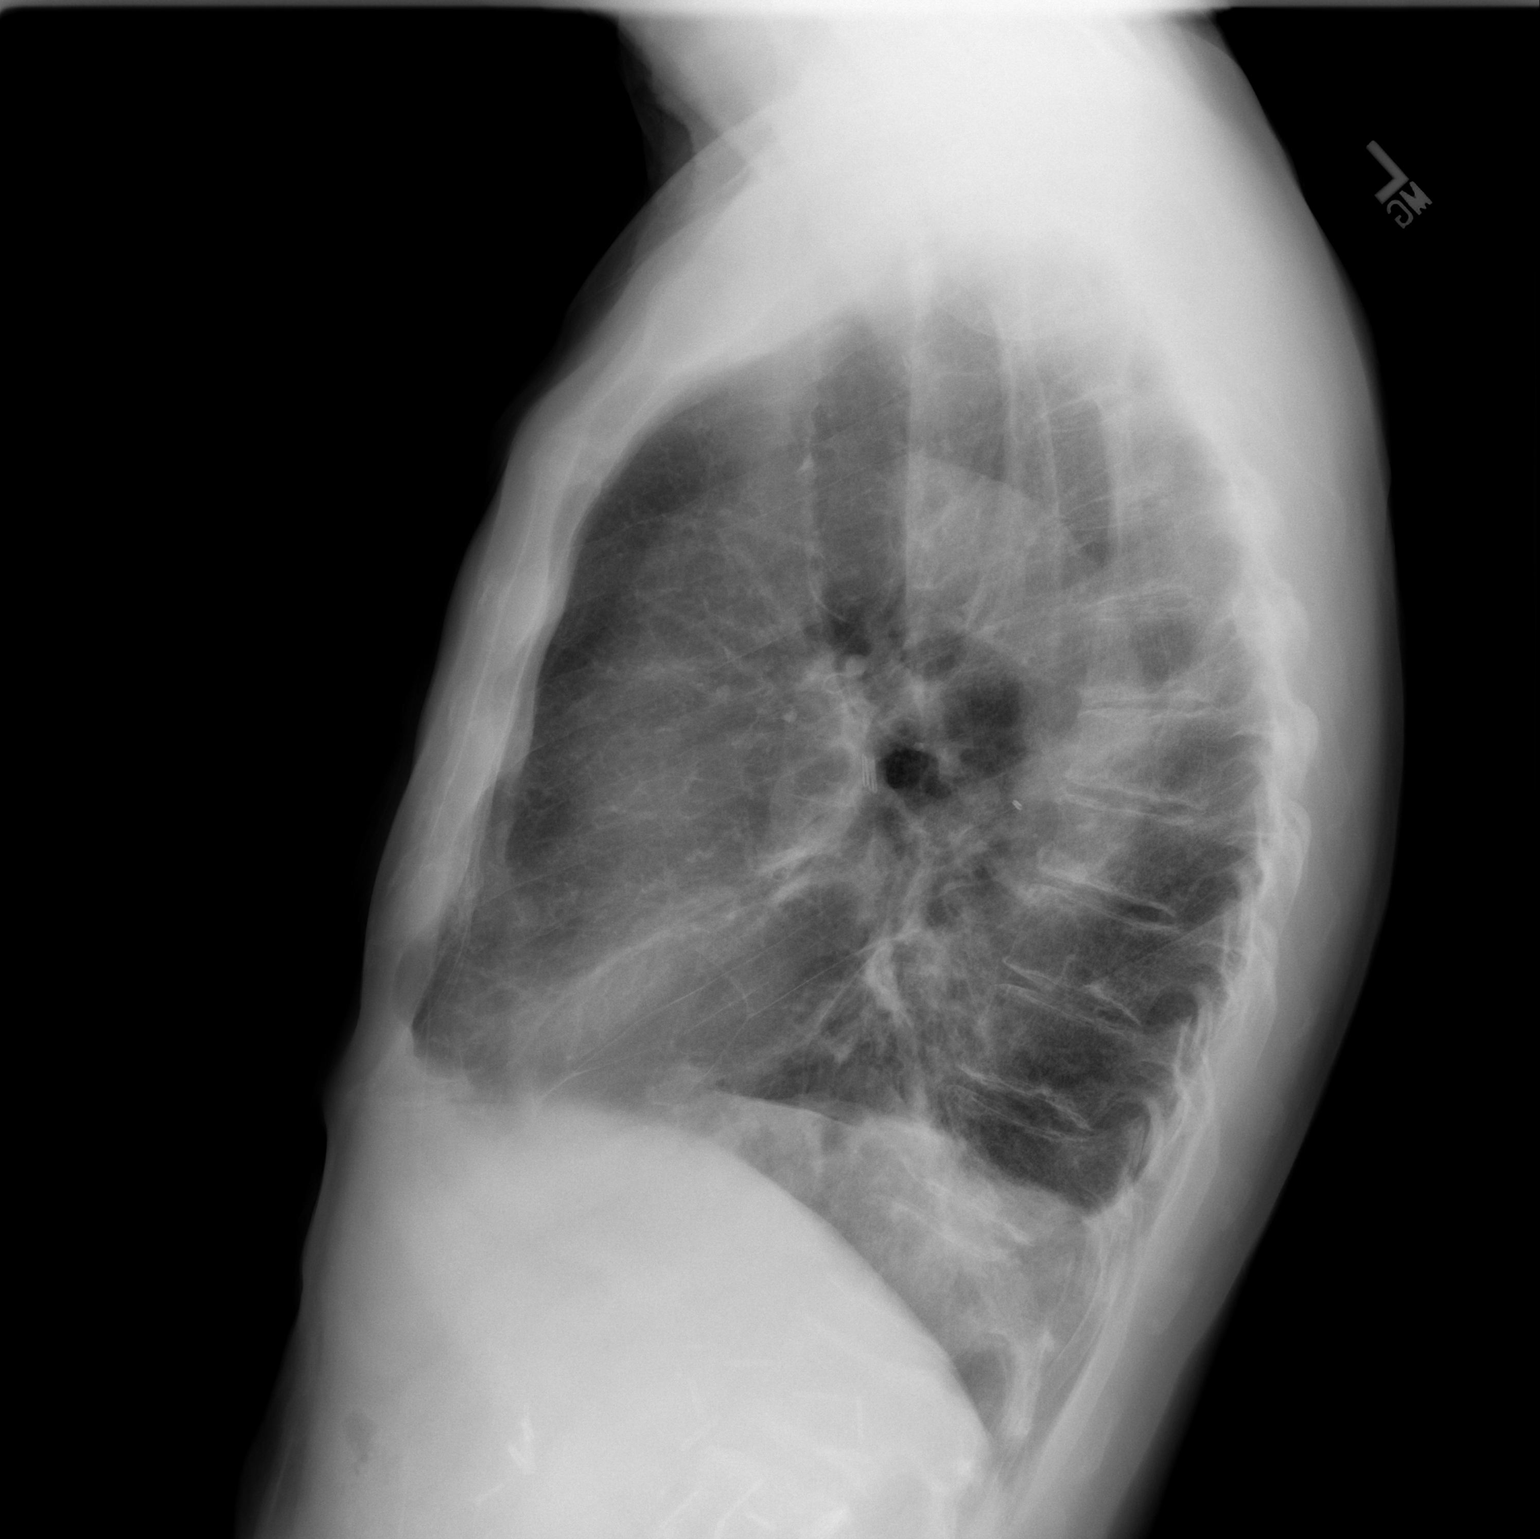

[2 of 2 positions shown; findings below may reference images not displayed]

FINDINGS: The previously noted left pleural effusion has resolved with only mild post op change remaining at the left base after gastric pull-through for esophageal carcinoma surgery.  The right lung is clear.  Heart size is stable.  Deformity of the right posterior sixth rib is stable.
IMPRESSION: Stable post op change at the left lung base.  No evidence of metastatic disease.

## 2008-05-06 ENCOUNTER — Ambulatory Visit: Payer: Self-pay | Admitting: Oncology

## 2008-05-10 ENCOUNTER — Ambulatory Visit (HOSPITAL_COMMUNITY): Admission: RE | Admit: 2008-05-10 | Discharge: 2008-05-10 | Payer: Self-pay | Admitting: Hematology & Oncology

## 2008-05-10 LAB — CBC WITH DIFFERENTIAL/PLATELET
Basophils Absolute: 0 10*3/uL (ref 0.0–0.1)
EOS%: 4.5 % (ref 0.0–7.0)
HCT: 42.8 % (ref 38.7–49.9)
HGB: 14.5 g/dL (ref 13.0–17.1)
MCH: 31.9 pg (ref 28.0–33.4)
MCV: 94.4 fL (ref 81.6–98.0)
MONO%: 12.2 % (ref 0.0–13.0)
NEUT%: 53.6 % (ref 40.0–75.0)

## 2008-05-10 LAB — COMPREHENSIVE METABOLIC PANEL
AST: 27 U/L (ref 0–37)
Alkaline Phosphatase: 70 U/L (ref 39–117)
BUN: 17 mg/dL (ref 6–23)
Creatinine, Ser: 0.91 mg/dL (ref 0.40–1.50)

## 2008-06-01 ENCOUNTER — Ambulatory Visit: Payer: Self-pay | Admitting: Thoracic Surgery

## 2008-07-28 IMAGING — CT CT CHEST W/ CM
2 of 5 series · 16 of 46 positions shown, 18 images · IV contrast (agent unspecified)
Comparison: 11/10/2007

CT CHEST

CLINICAL DATA: Follow-up esophageal cancer

CT CHEST, ABDOMEN AND PELVIS WITH CONTRAST
TECHNIQUE: Multidetector CT imaging of the chest, abdomen and
pelvis was performed following the standard protocol during bolus
administration of intravenous contrast.
Contrast: 100 ml omni 300

[Series 2: cap 5.0 b40f · axial · 0.68mm/px · z∈[-626,-36]mm · 13 of 134 slices shown, 15 images]
[im 8/134  soft-tissue]
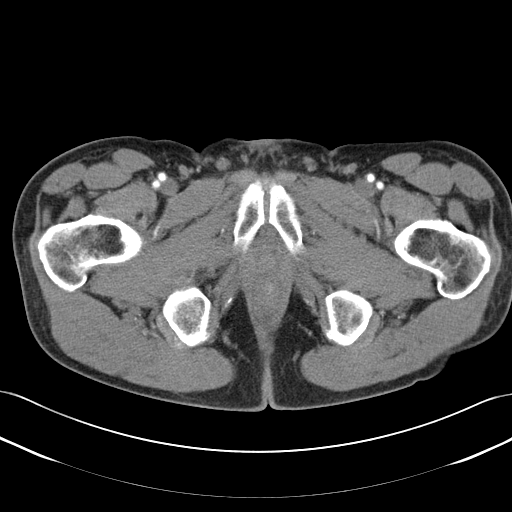
[im 8/134  bone]
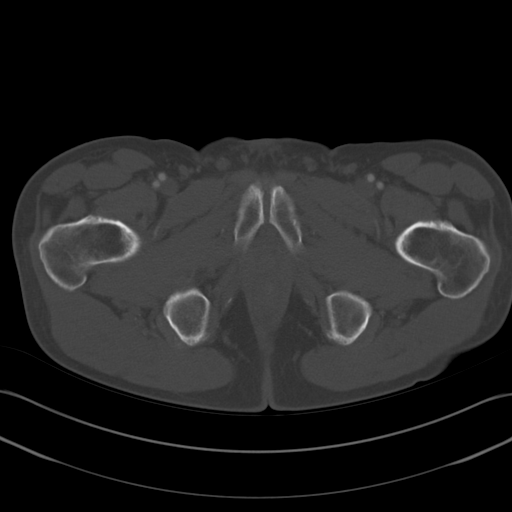
[im 16/134  soft-tissue]
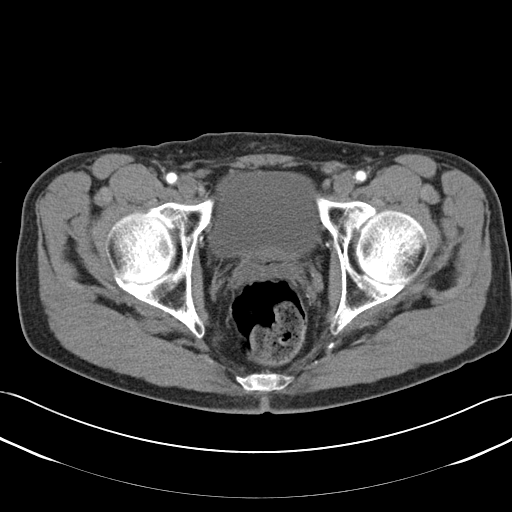
[im 32/134  soft-tissue]
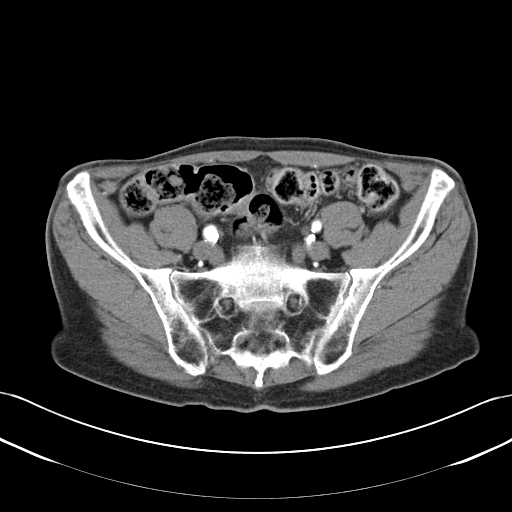
[im 40/134  soft-tissue]
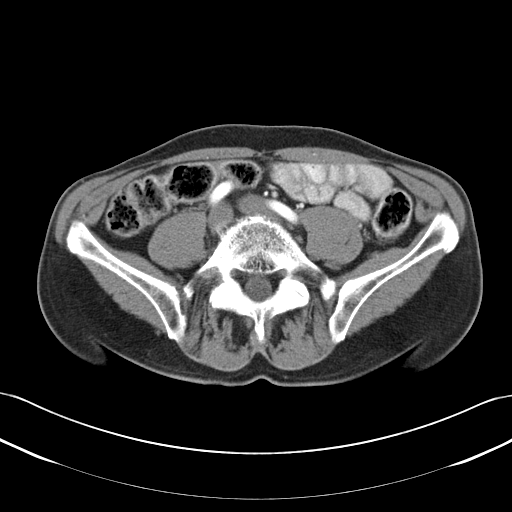
[im 47/134  soft-tissue]
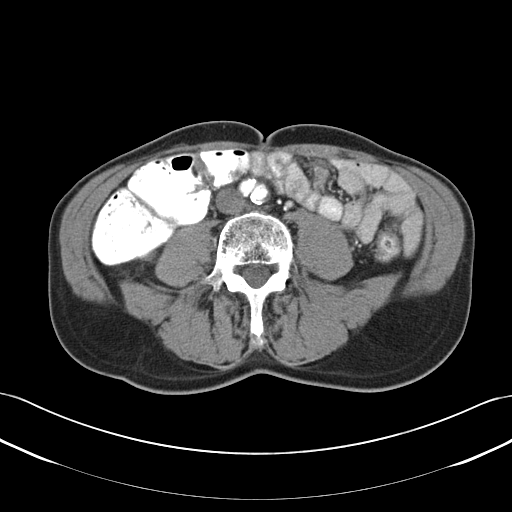
[im 55/134  soft-tissue]
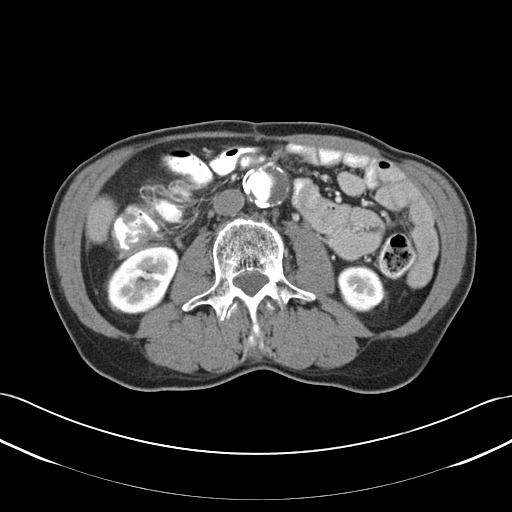
[im 71/134  soft-tissue]
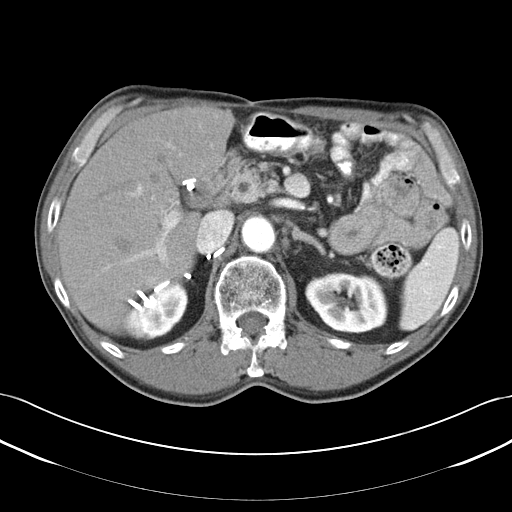
[im 79/134  soft-tissue]
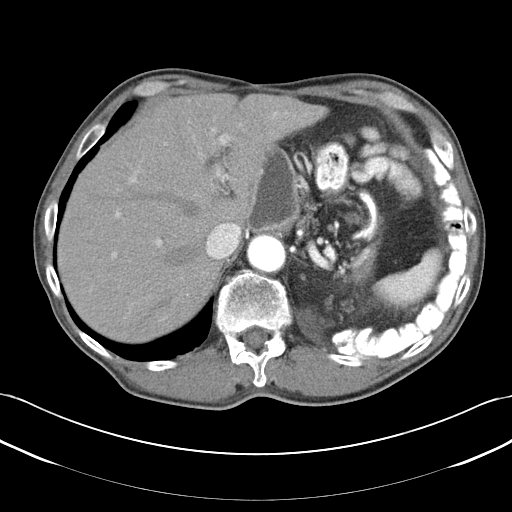
[im 87/134  soft-tissue]
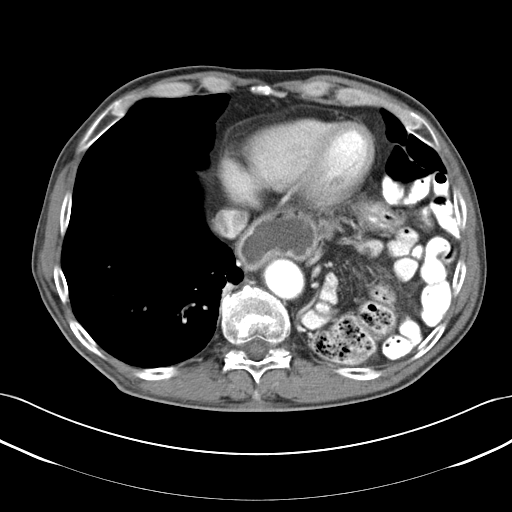
[im 87/134  bone]
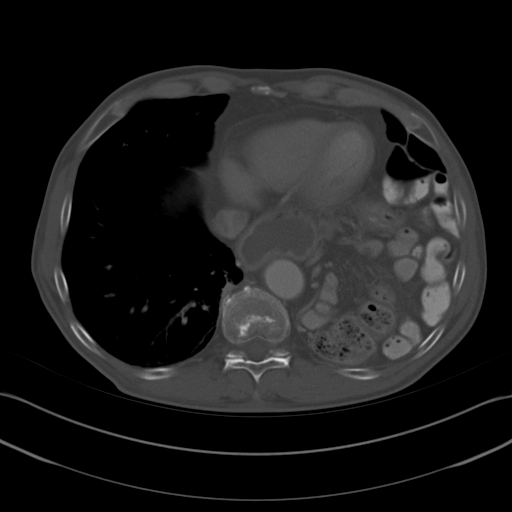
[im 94/134  soft-tissue]
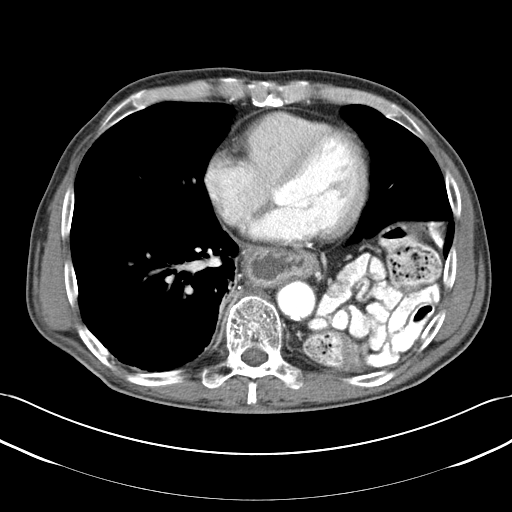
[im 102/134  soft-tissue]
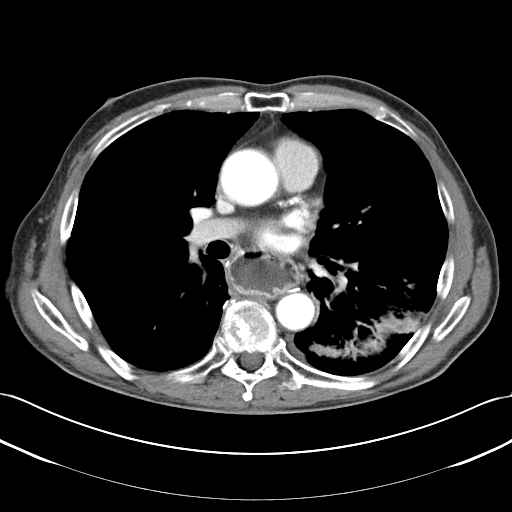
[im 118/134  soft-tissue]
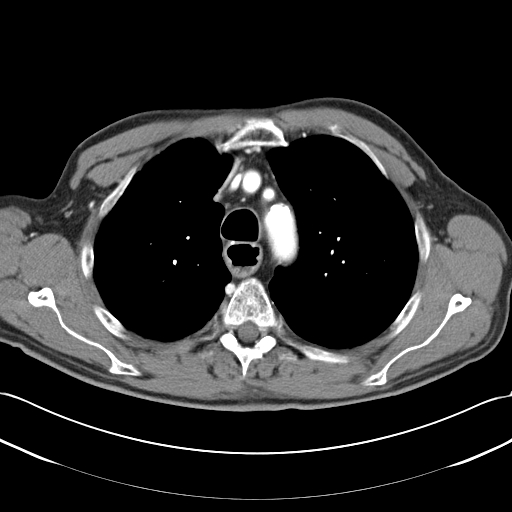
[im 126/134  soft-tissue]
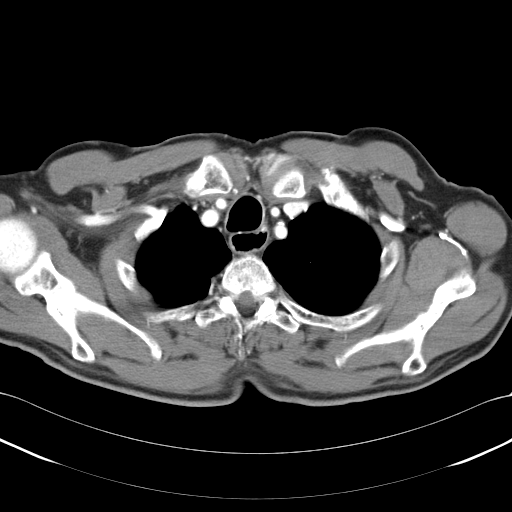

[Series 602: <mpr thick range> · coronal · 1.31mm/px · 3 of 76 slices shown]
[im 26/76  soft-tissue]
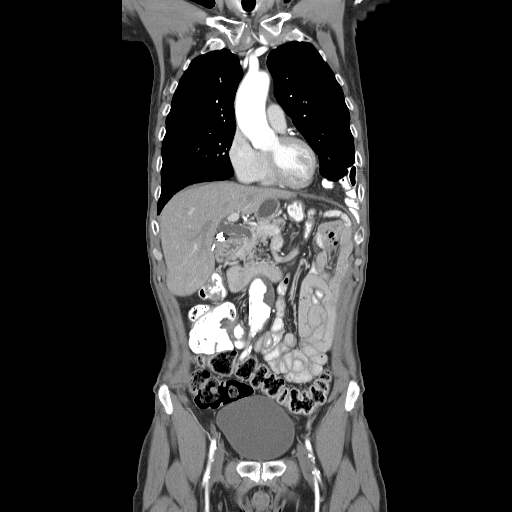
[im 34/76  soft-tissue]
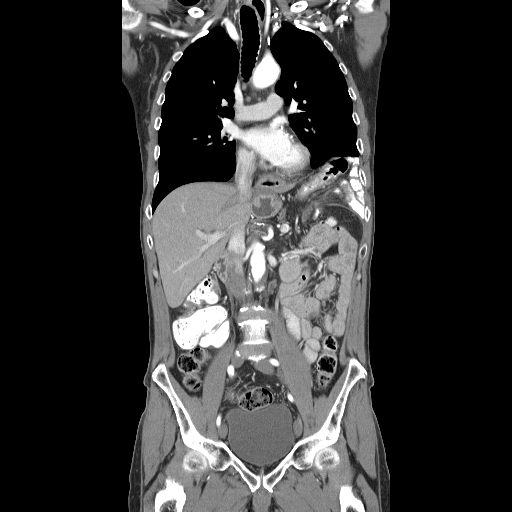
[im 42/76  soft-tissue]
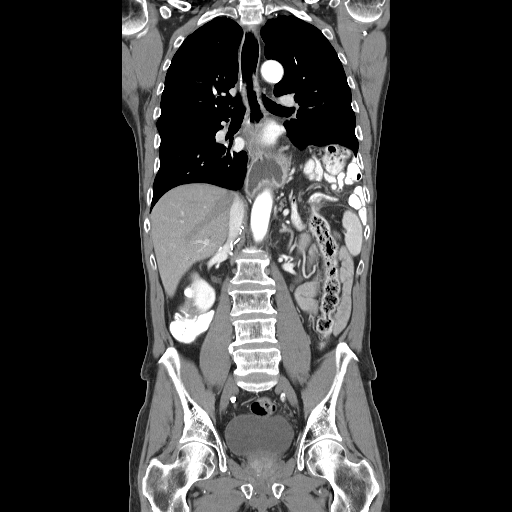

[16 of 46 positions shown; findings below may reference images not displayed]

FINDINGS: There are no enlarged axillary or supraclavicular lymph
nodes.

No enlarged mediastinal lymph nodes are present there is no hilar
lymph nodes present.

Within the posterior mediastinum there are postoperative changes
from esophagectomy with gastric pull-through.

The heart size is normal.  There is no pleural or pericardial
effusion.

Left diaphragmatic hernia containing nonobstructed colon is again
noted.

Advanced emphysematous changes are identified.

No suspicious pulmonary nodule or mass is noted.

Review of the visualized osseous structures shows diffuse
osteopenia.

Postoperative changes within the right posterior ribs are
identified.

No suspicious lytic or sclerotic lesions are present.
IMPRESSION: 1.  Stable CT chest.  There are no specific features to suggest
residual or recurrent tumor.

CT ABDOMEN
FINDINGS: The liver is normal in attenuation and morphology.

The patient is status post cholecystectomy.The common bile duct is
increased in caliber measuring 10.3 mm, similar to previous study.
The proximal pancreatic duct is borderline increased in caliber
measuring 3 mm.  No masses identified.

The spleen is normal.

The left adrenal gland is negative.
The patient is status post right adrenalectomy.

Cyst in right kidney is stable from previous study.

The left kidney is negative.

Postoperative changes from the cholecystectomy is noted.

There is no pathologically enlarged retroperitoneal or small bowel
mesenteric lymph nodes.

Infrarenal abdominal aortic aneurysm measures 3.4 cm.  There is
extensive concentric atheromatous changes involving the abdominal
aorta.

No free fluid or abnormal fluid collection noted.

The bowel loops of the upper abdomen are unremarkable.
IMPRESSION: 1.  Stable CT of the upper abdomen.  No specific features for
residual or recurrent tumor noted.
2.  Stable abdominal aortic aneurysm.
3.  Increased caliber of common bile duct status post
cholecystectomy is similar to previous study.

CT PELVIS
FINDINGS: Negative for free fluid or abnormal fluid collection.

The urinary bladder is negative.

No enlarged pelvic or inguinal lymph nodes.

The pelvic bowel loops are unremarkable.

There is no mass identified.

Review of the visualized osseous structures is unremarkable.
IMPRESSION: 1.  Stable CT pelvis.  No evidence for mass or adenopathy.

## 2008-11-16 ENCOUNTER — Ambulatory Visit: Payer: Self-pay | Admitting: Oncology

## 2008-11-18 ENCOUNTER — Ambulatory Visit (HOSPITAL_COMMUNITY): Admission: RE | Admit: 2008-11-18 | Discharge: 2008-11-18 | Payer: Self-pay | Admitting: Hematology & Oncology

## 2008-11-24 ENCOUNTER — Ambulatory Visit: Payer: Self-pay | Admitting: Hematology & Oncology

## 2008-12-01 ENCOUNTER — Ambulatory Visit: Payer: Self-pay | Admitting: Thoracic Surgery

## 2009-02-05 IMAGING — CT CT PELVIS W/ CM
2 of 5 series · 16 of 46 positions shown, 18 images · IV contrast (agent unspecified)
Comparison: 05/10/2008

CT CHEST

CLINICAL DATA: Esophageal cancer

CT CHEST, ABDOMEN AND PELVIS WITH CONTRAST
TECHNIQUE: Multidetector CT imaging of the chest, abdomen and
pelvis was performed following the standard protocol during bolus
administration of intravenous contrast.
Contrast: 100 ml of omni 300

[Series 2: cap 5.0 b40f · axial · 0.68mm/px · z∈[-654,-59]mm · 13 of 135 slices shown, 15 images]
[im 8/135  soft-tissue]
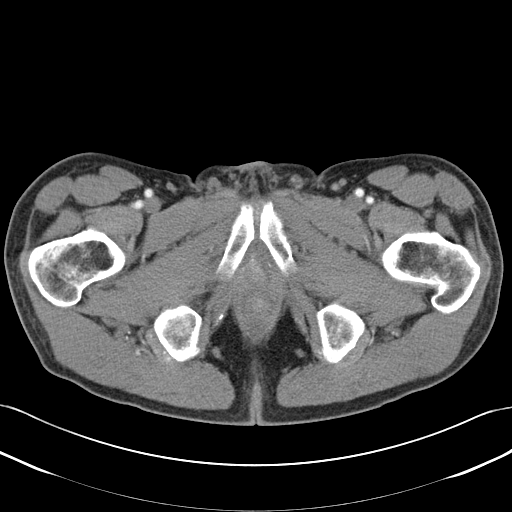
[im 8/135  bone]
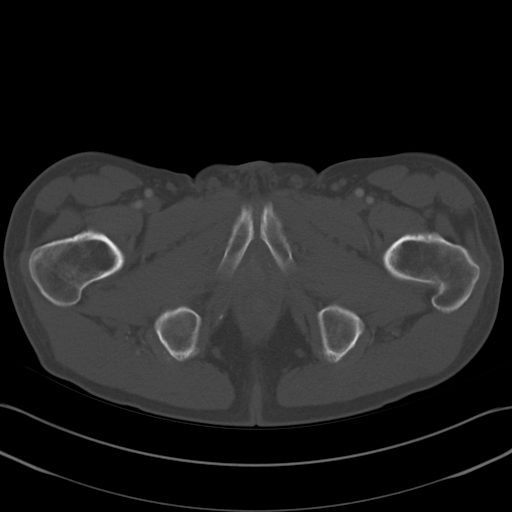
[im 16/135  soft-tissue]
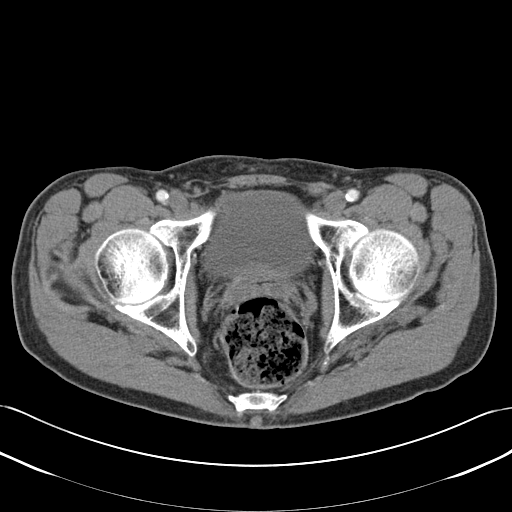
[im 32/135  soft-tissue]
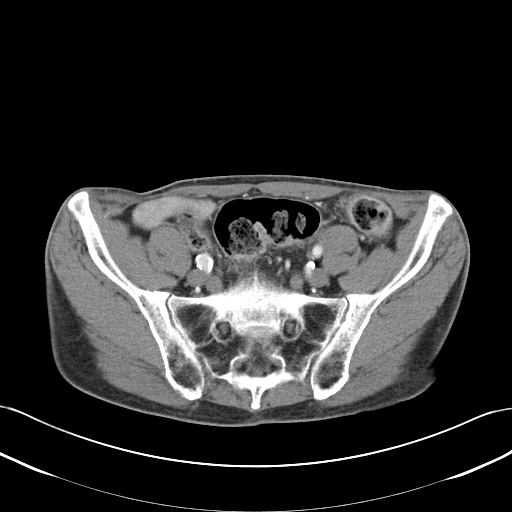
[im 40/135  soft-tissue]
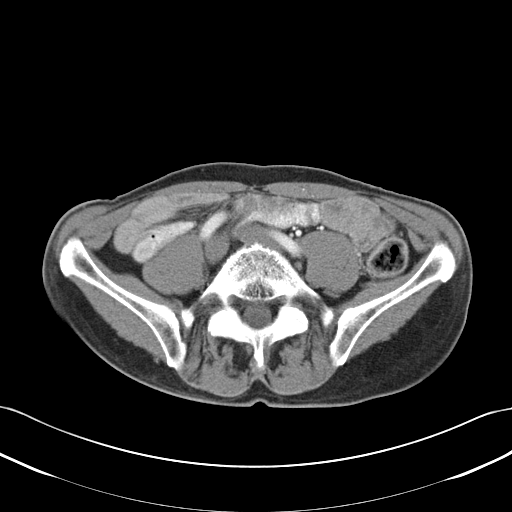
[im 48/135  soft-tissue]
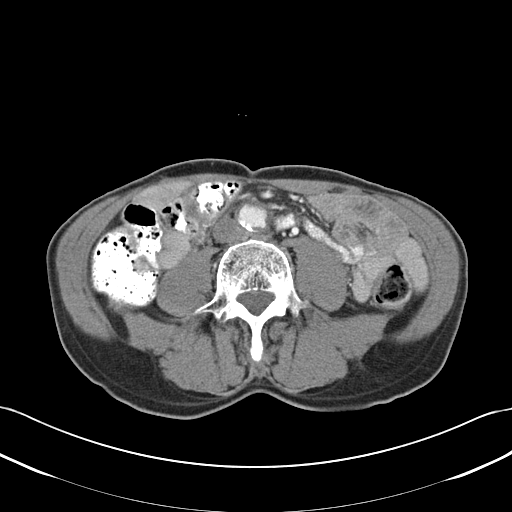
[im 56/135  soft-tissue]
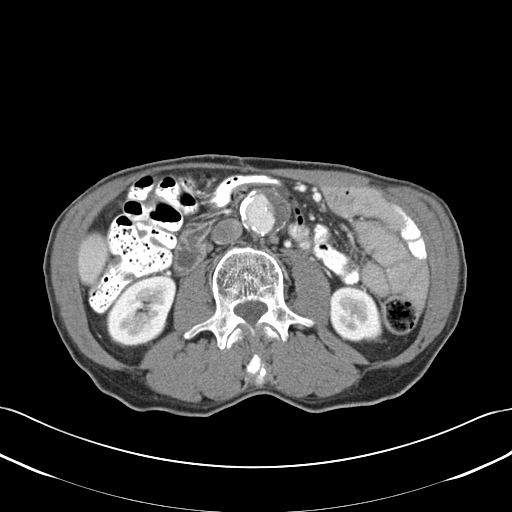
[im 71/135  soft-tissue]
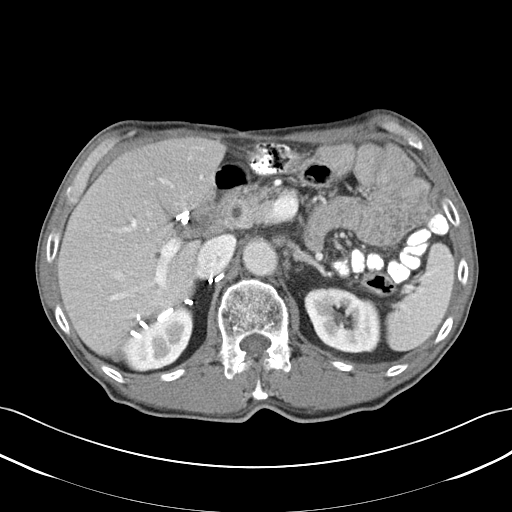
[im 79/135  soft-tissue]
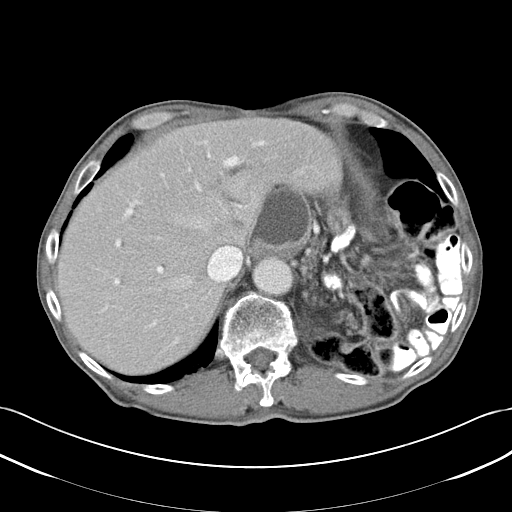
[im 87/135  soft-tissue]
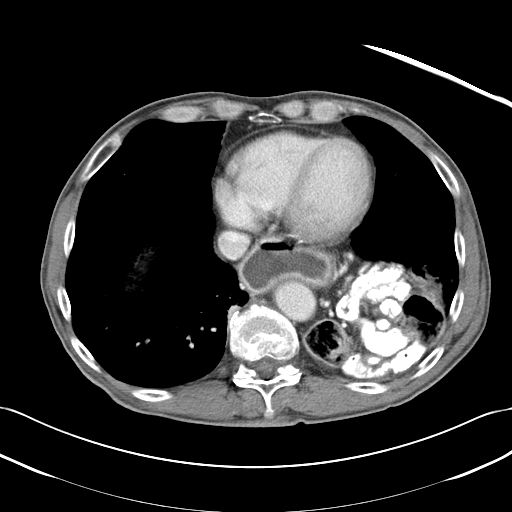
[im 87/135  bone]
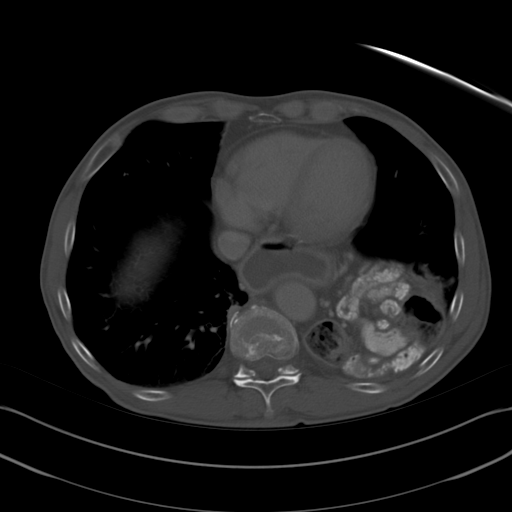
[im 95/135  soft-tissue]
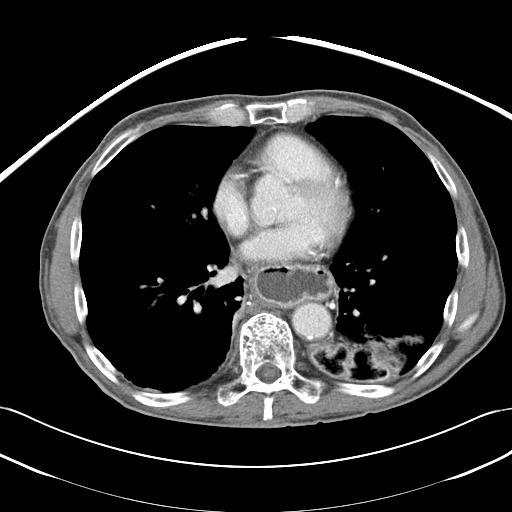
[im 103/135  soft-tissue]
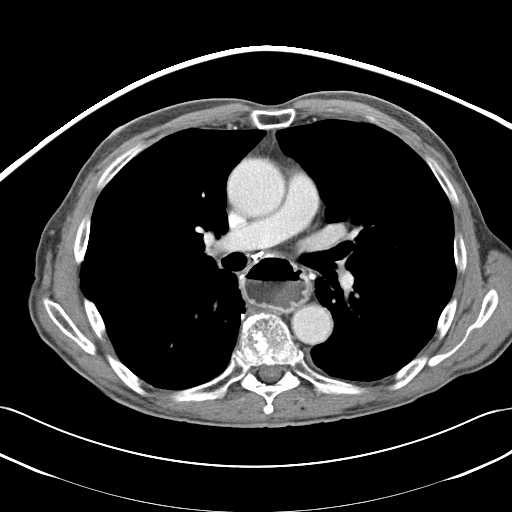
[im 119/135  soft-tissue]
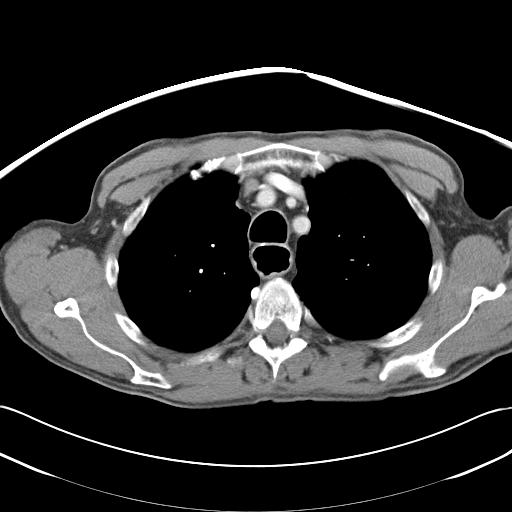
[im 127/135  soft-tissue]
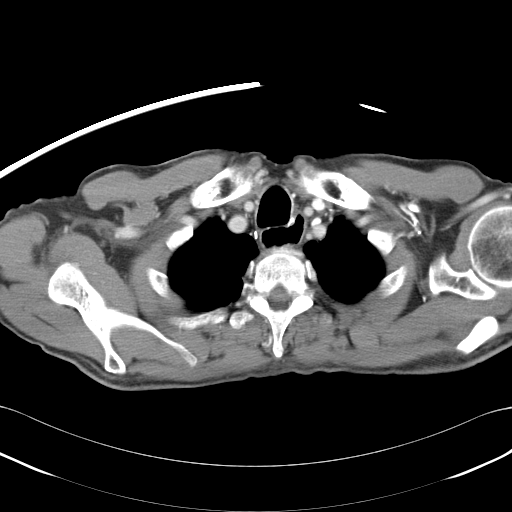

[Series 602: <mpr thick range> · coronal · 1.32mm/px · 3 of 77 slices shown]
[im 26/77  soft-tissue]
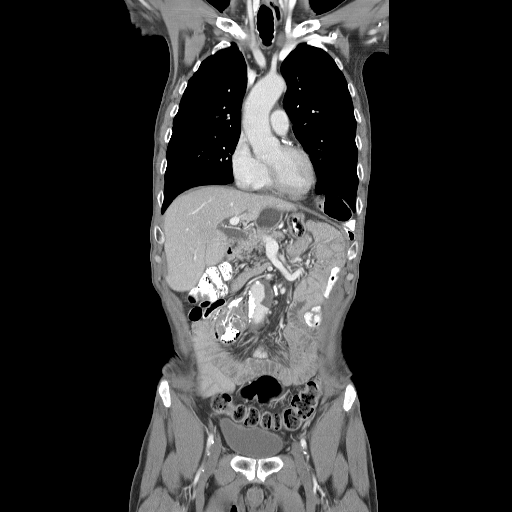
[im 34/77  soft-tissue]
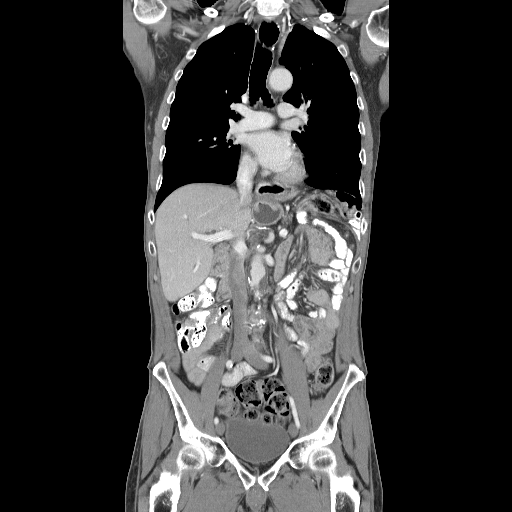
[im 43/77  soft-tissue]
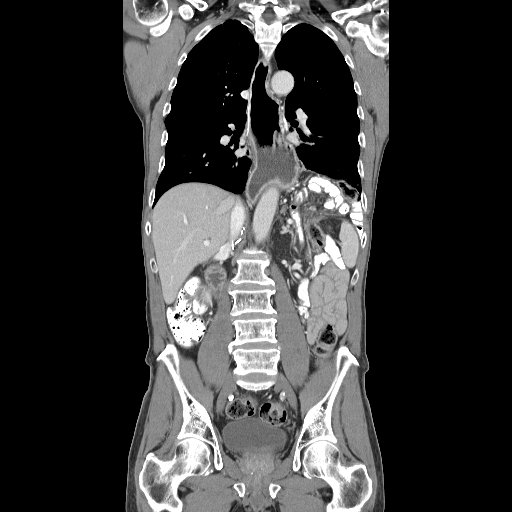

[16 of 46 positions shown; findings below may reference images not displayed]

FINDINGS: There are no enlarged axillary or supraclavicular lymph
nodes.

No enlarged mediastinal or hilar lymph nodes.

Postoperative change from a in an esophagectomy and gastric pull-
through identified.

The lungs are emphysematous.

The lungs are clear.  No pulmonary nodule or mass identified.

There is a defect within the posterior aspect of the left
hemidiaphragm through which small and large bowel herniate.

Review of the visualized osseous structures shows postoperative
change involving the right posterior ribs.  There are no lytic or
sclerotic lesions identified.
IMPRESSION: 1.  Stable CT of the chest without specific features to suggest
residual or recurrent tumor.

CT ABDOMEN
FINDINGS: The patient is status post cholecystectomy.

There is mild intra and extrahepatic biliary ductal dilatation
which is unchanged from the previous exam.

The pancreas is negative. The left adrenal gland is negative.  The
right adrenal gland is not visualized and may have been surgically
resected.

The left kidney is normal.

There is a simple appearing cyst within the lower pole of the right
kidney.

The spleen is normal.

No enlarged retroperitoneal lymph nodes identified.

There is no upper abdominal ascites.

There is no peritoneal nodule or mass.

No abnormal bowel dilatation identified.

Infra renal abdominal aortic aneurysm is again noted.  This
measures 3.5 centimeters in maximum AP dimension, image 78.  This
is unchanged from prior exam.

Review of visualized osseous structures shows lumbar degenerative
disc disease.
IMPRESSION: 1.  Negative for mass or adenopathy.
2.  Stable infrarenal abdominal aortic aneurysm.
3.  No change and increased caliber of common bile duct status post
cholecystectomy.

CT PELVIS
FINDINGS: Urinary bladder is negative.

The pelvic bowel loops are unremarkable.

No enlarged pelvic or inguinal lymph nodes are identified. No
peritoneal nodule or mass.

No ascites.

Review of the visualized osseous structures is negative.
IMPRESSION: 1.  Negative for mass or adenopathy.

## 2009-06-07 ENCOUNTER — Encounter: Admission: RE | Admit: 2009-06-07 | Discharge: 2009-06-07 | Payer: Self-pay | Admitting: Internal Medicine

## 2009-06-08 ENCOUNTER — Inpatient Hospital Stay (HOSPITAL_COMMUNITY): Admission: EM | Admit: 2009-06-08 | Discharge: 2009-06-13 | Payer: Self-pay | Admitting: Emergency Medicine

## 2009-06-29 ENCOUNTER — Encounter: Admission: RE | Admit: 2009-06-29 | Discharge: 2009-06-29 | Payer: Self-pay | Admitting: Thoracic Surgery

## 2009-06-29 ENCOUNTER — Ambulatory Visit: Payer: Self-pay | Admitting: Thoracic Surgery

## 2009-08-25 IMAGING — CR DG CHEST 2V
2 series · 2 of 2 positions shown · non-contrast
Comparison: [HOSPITAL] chest CT 11/18/2008 and [HOSPITAL]
[HOSPITAL] [REDACTED] [HOSPITAL] chest x-ray 11/25/2007.

CLINICAL DATA: History esophageal cancer with current cough and
fever.

CHEST - 2 VIEW

[view not recorded (1 of 2)]
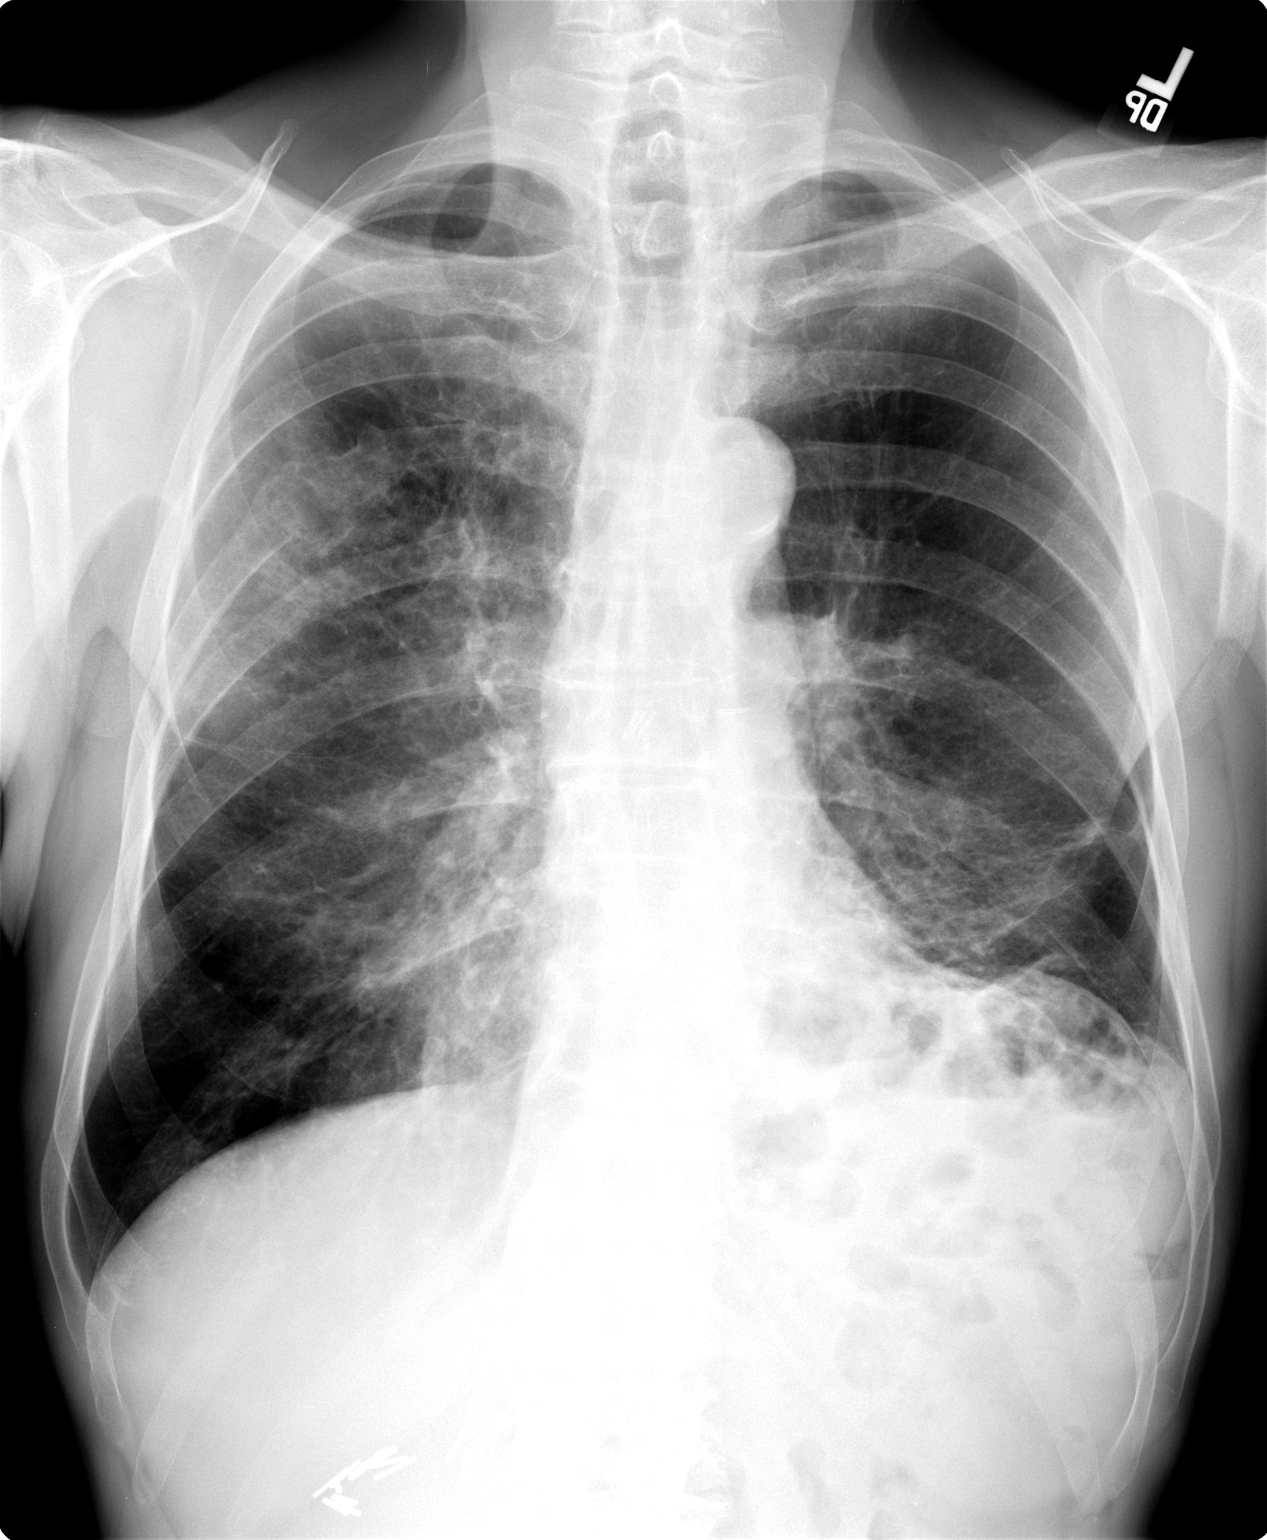

[view not recorded (2 of 2)]
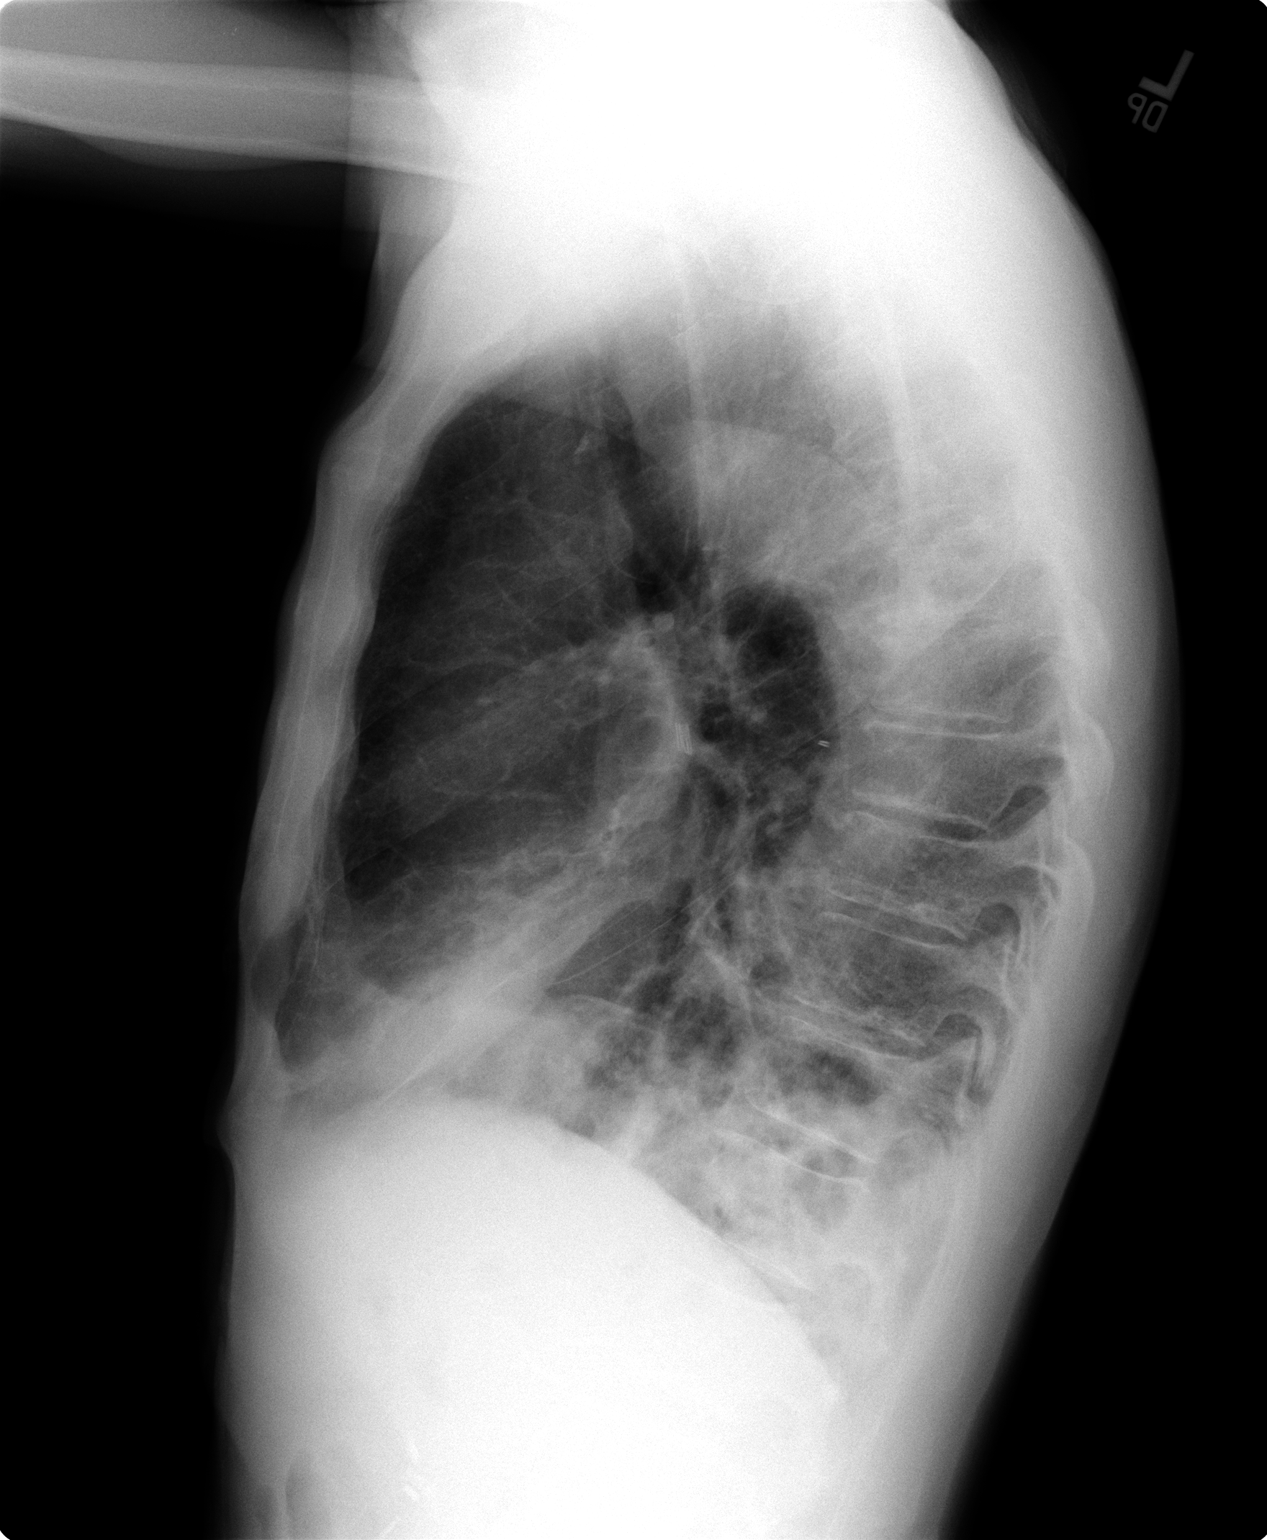

[2 of 2 positions shown; findings below may reference images not displayed]

FINDINGS: Since prior studies right middle lobe and lesser
perihilar right upper lobe and lingular / left lower lobe diffuse
patchy interstitial infiltrates are seen superimposed upon the
slight chronic bronchitis.  No other consolidative pneumonia is
seen with stable post thoracotomy change posterior right sixth rib
and post surgical changes left lung base.  Heart size remains
stable and normal with calcified thoracic aortic arch.  No other
new acute findings seen.
IMPRESSION: 1.  Interval development of diffuse interstitial pneumonitis
involving right perihilar upper lobe, right middle lobe, lingula
and left lower lobe.  Slightly more focality is seen at the right
middle lobe medial segment consistent with more focal
bronchopneumonia.
2.  Stable chronic bronchitis and postoperative changes.

## 2009-08-26 IMAGING — CR DG CHEST 2V
2 series · 2 of 2 positions shown · non-contrast
Comparison: [DATE] and multiple previous

CLINICAL DATA: Cough.  Chronic lung disease.

CHEST - 2 VIEW

[w chest pa]
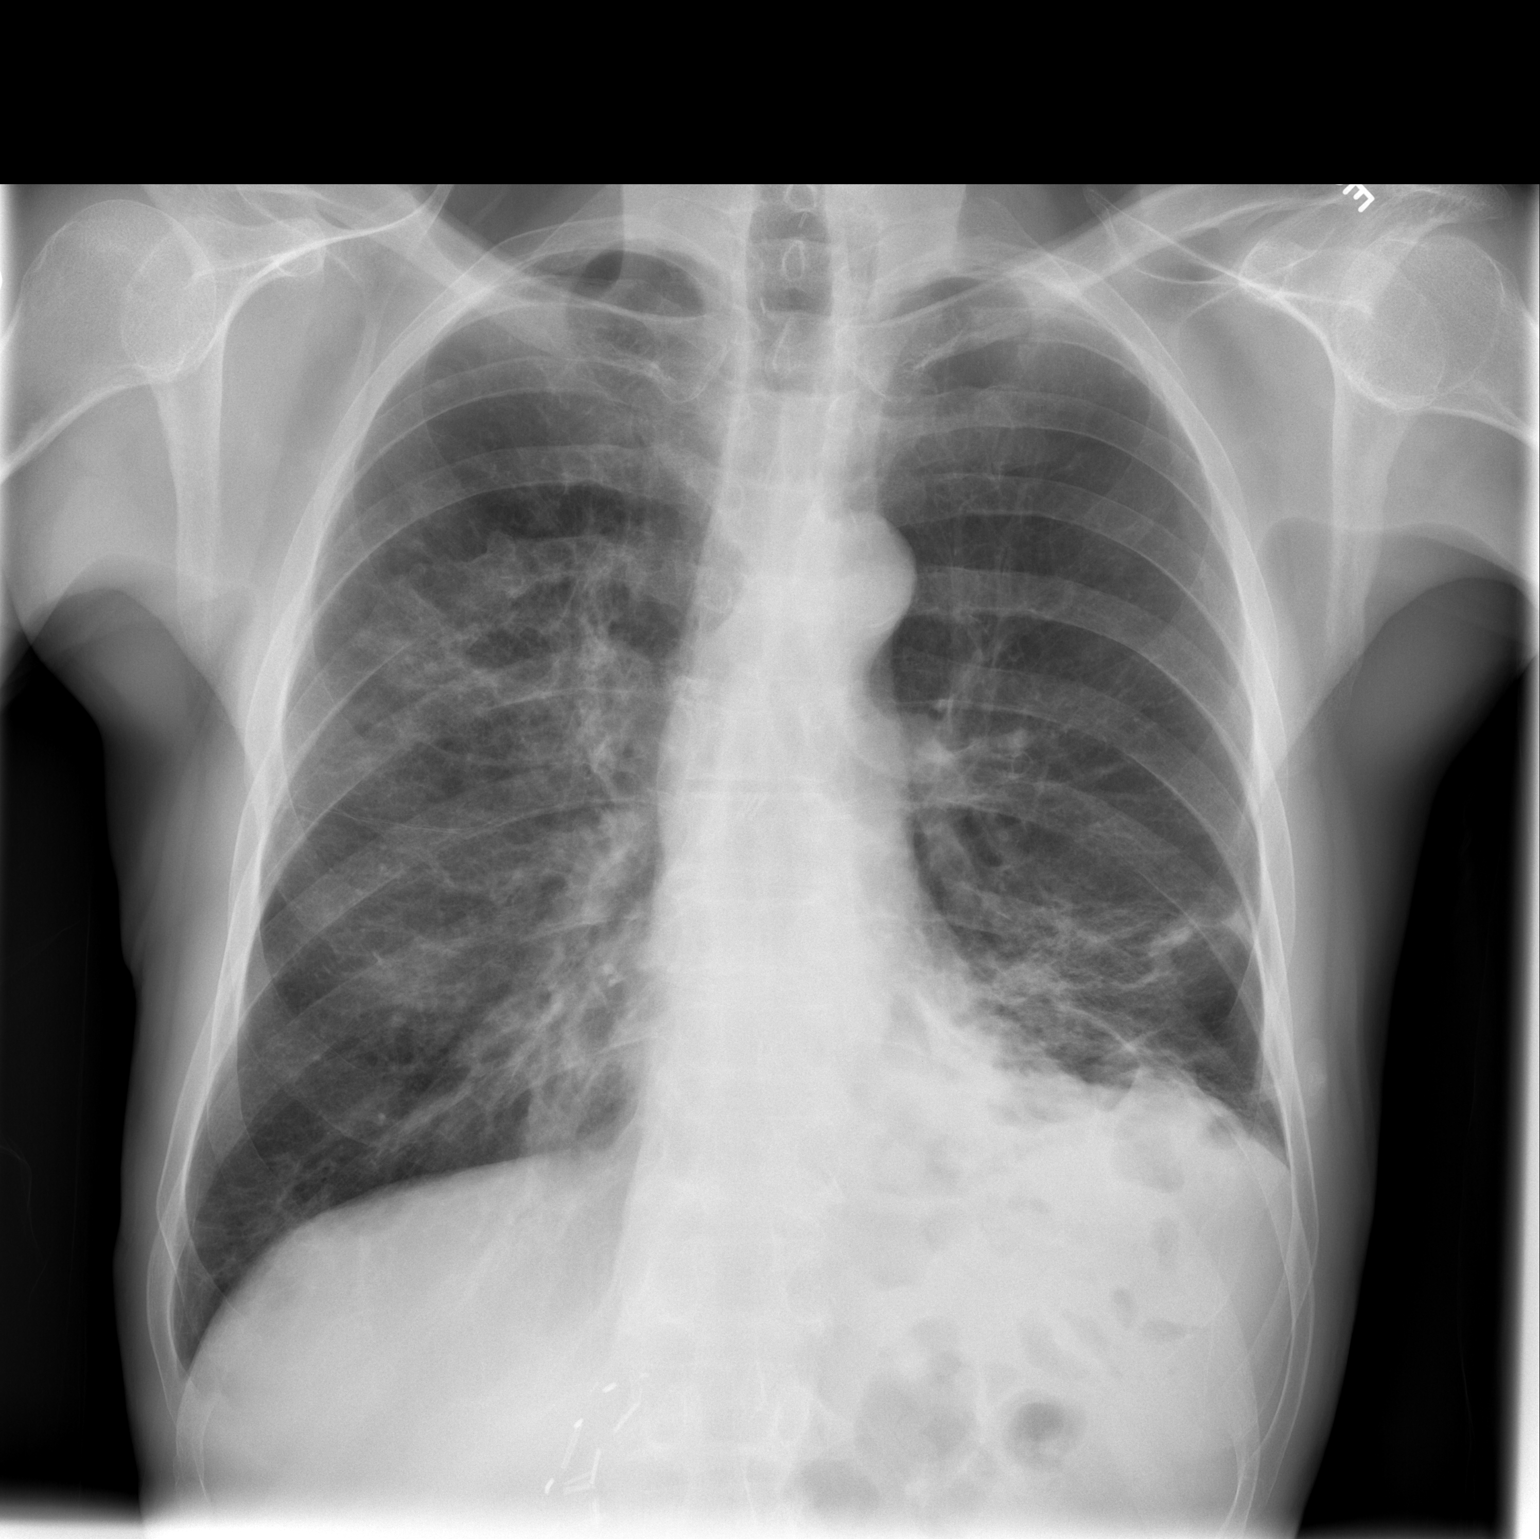

[w chest lat]
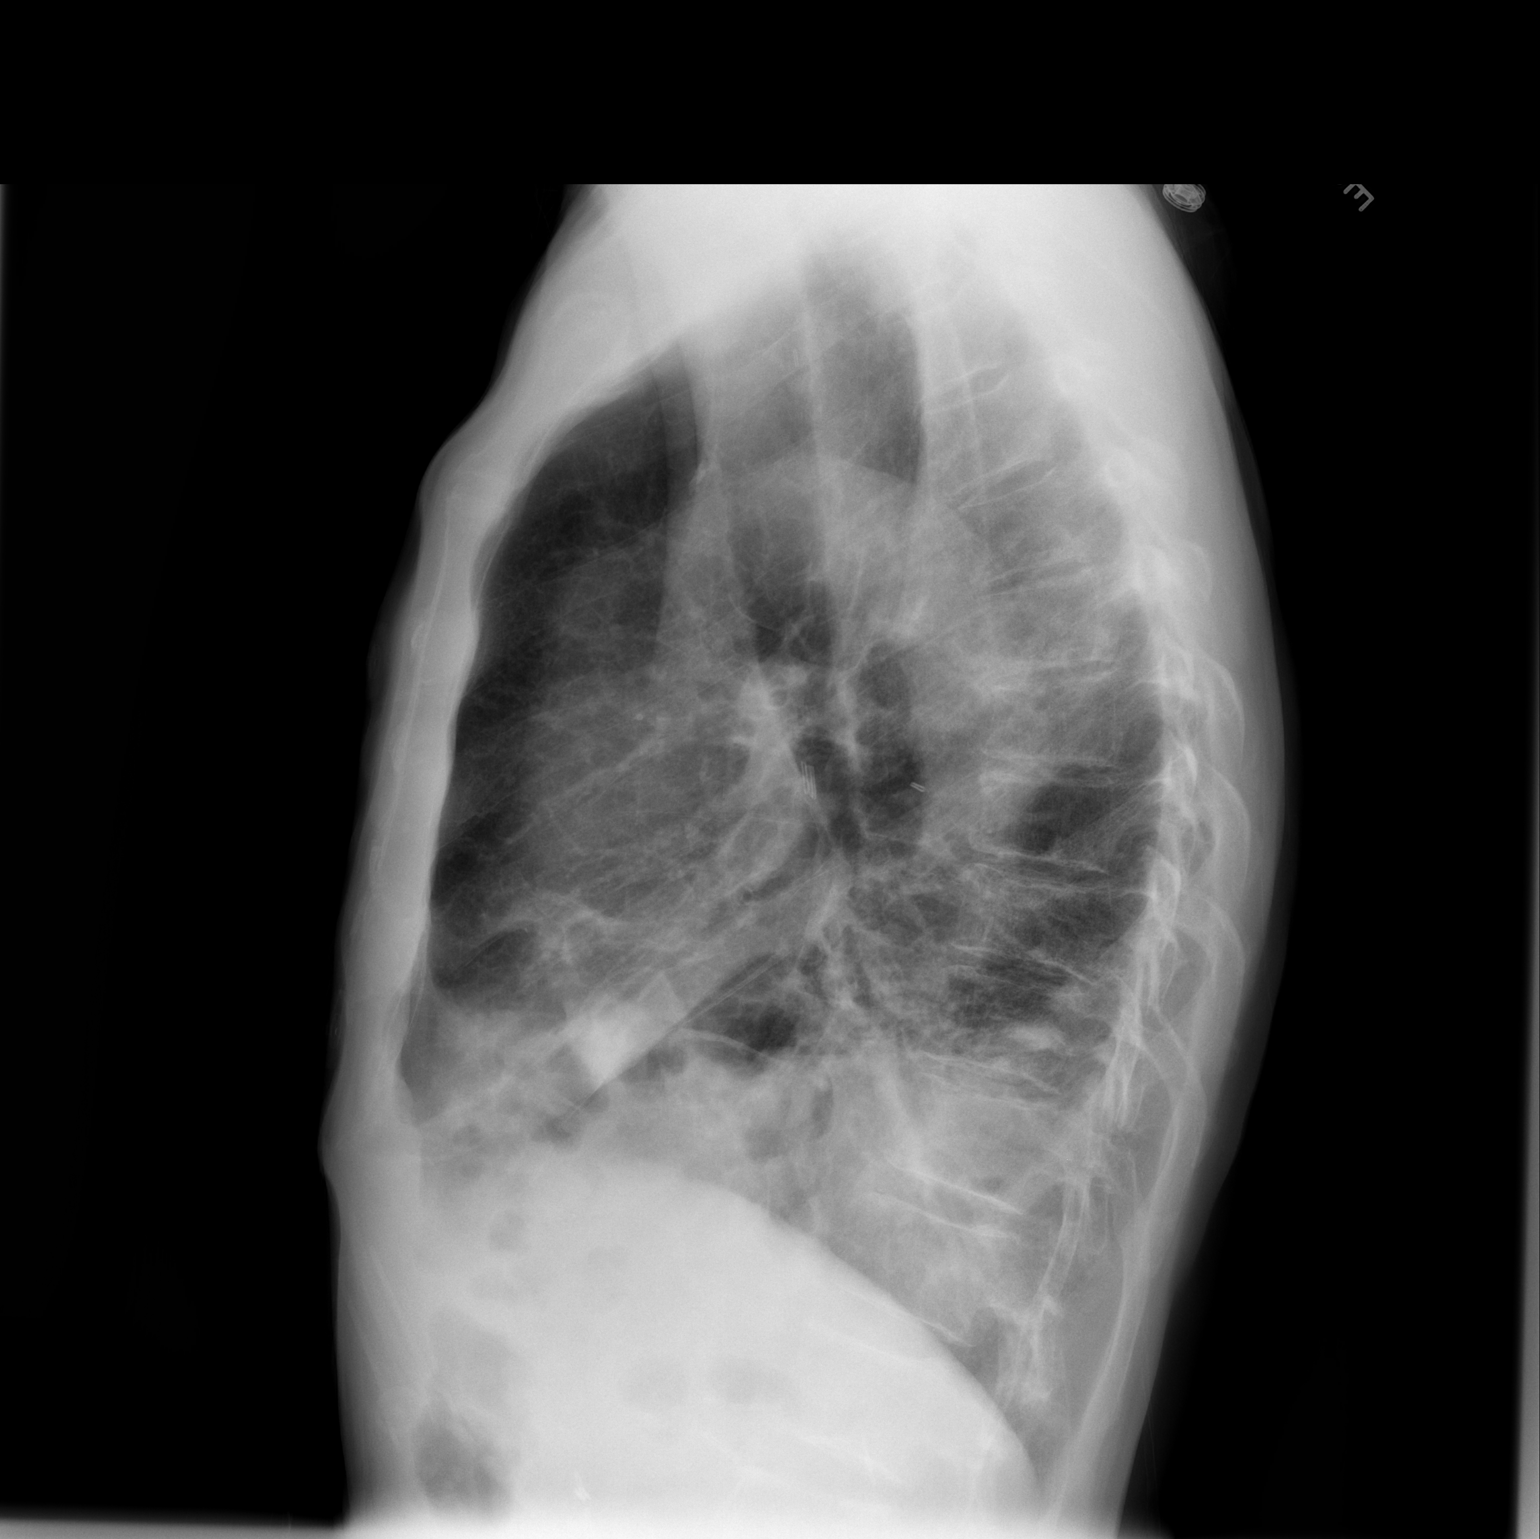

[2 of 2 positions shown; findings below may reference images not displayed]

FINDINGS: Infiltrate in the right perihilar region and the left
lower lobe persists.  Volume loss may be increased in the left
lower lobe.  Left upper lobe is clear.  There is some pleural fluid
on the left.
IMPRESSION: Persistent bilateral pneumonia, possibly worsened in the left lower
lobe.

## 2009-08-29 IMAGING — CR DG CHEST 2V
2 series · 2 of 2 positions shown · non-contrast
Comparison: Chest x-ray of 06/08/2009

CLINICAL DATA: Pneumonia, follow-up

CHEST - 2 VIEW

[w chest pa]
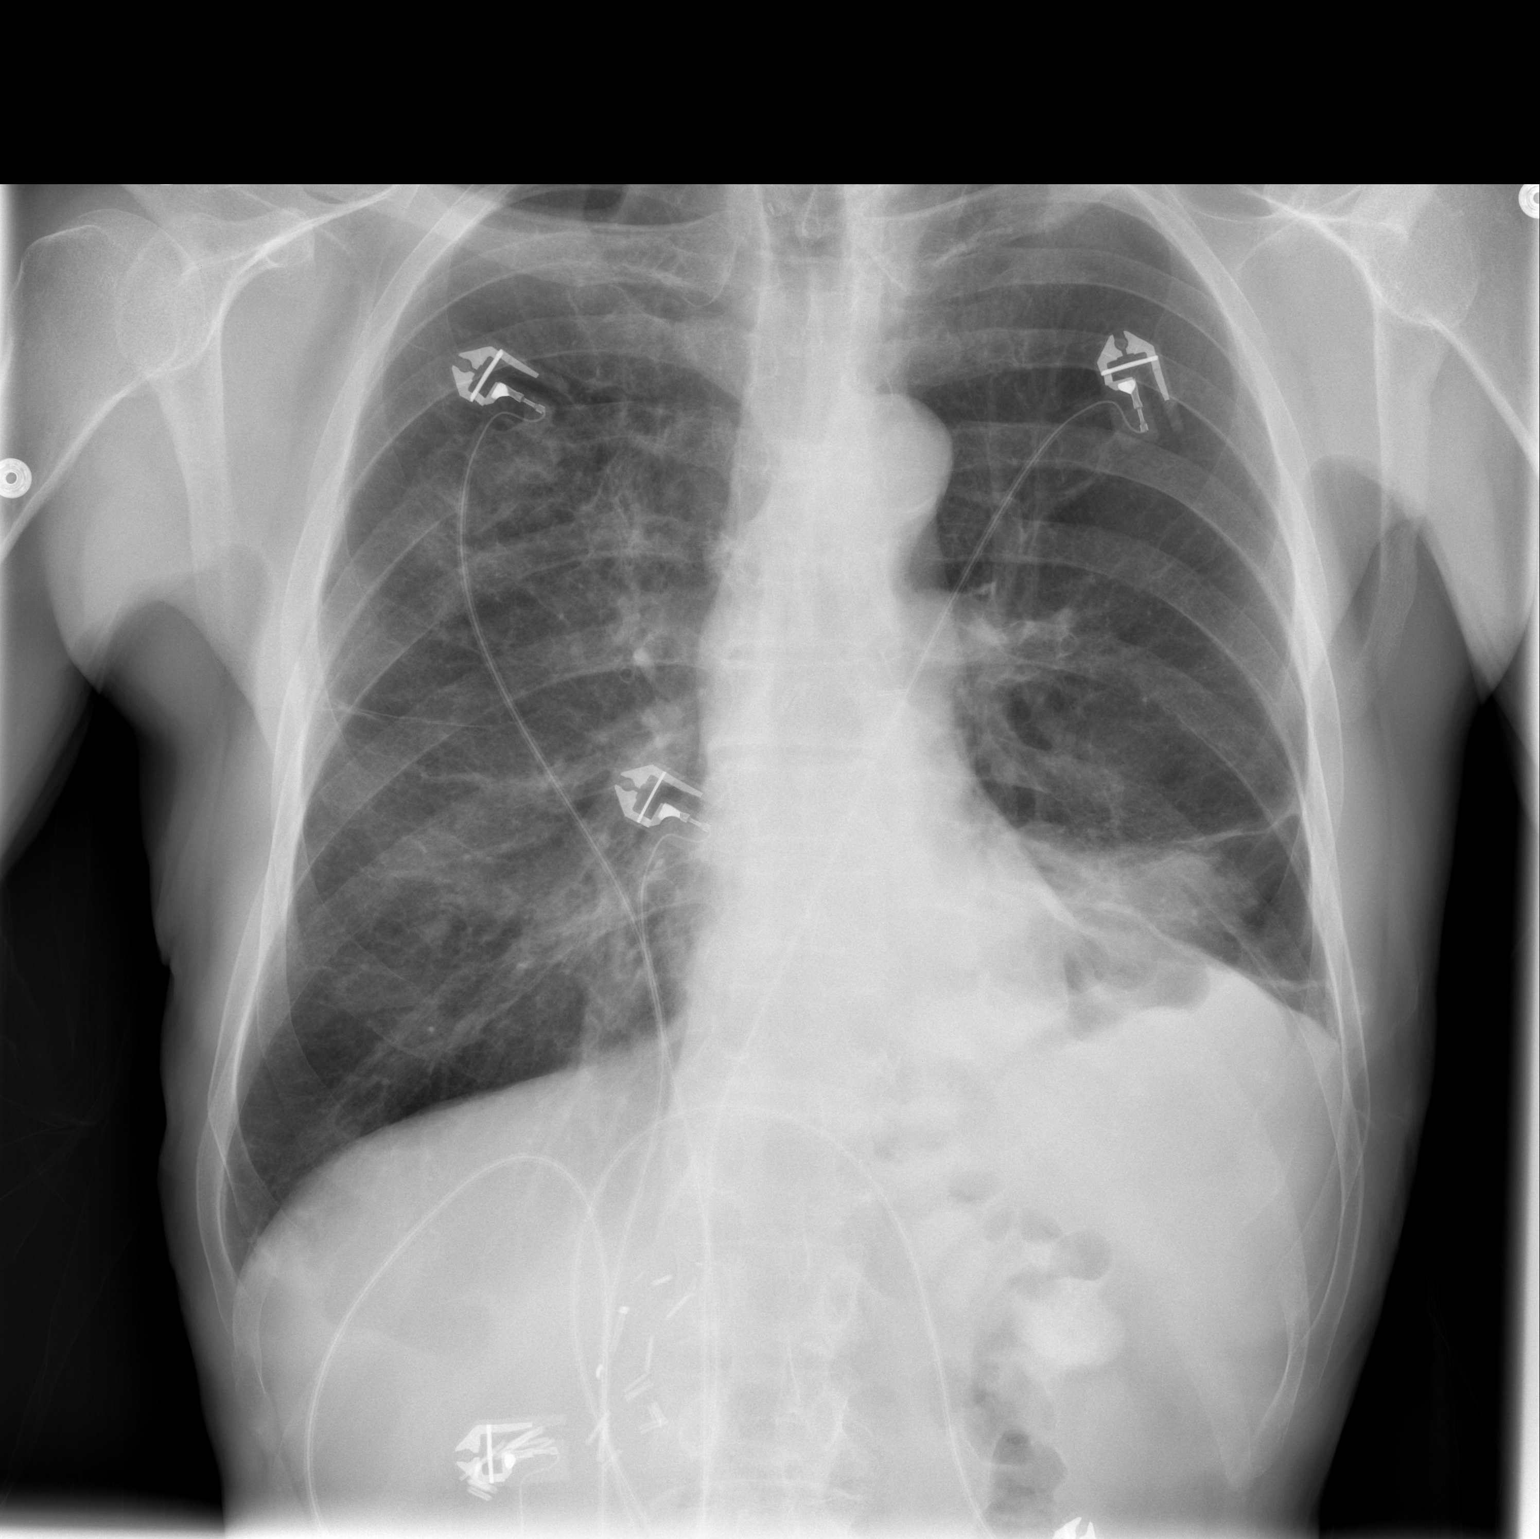

[w chest lat]
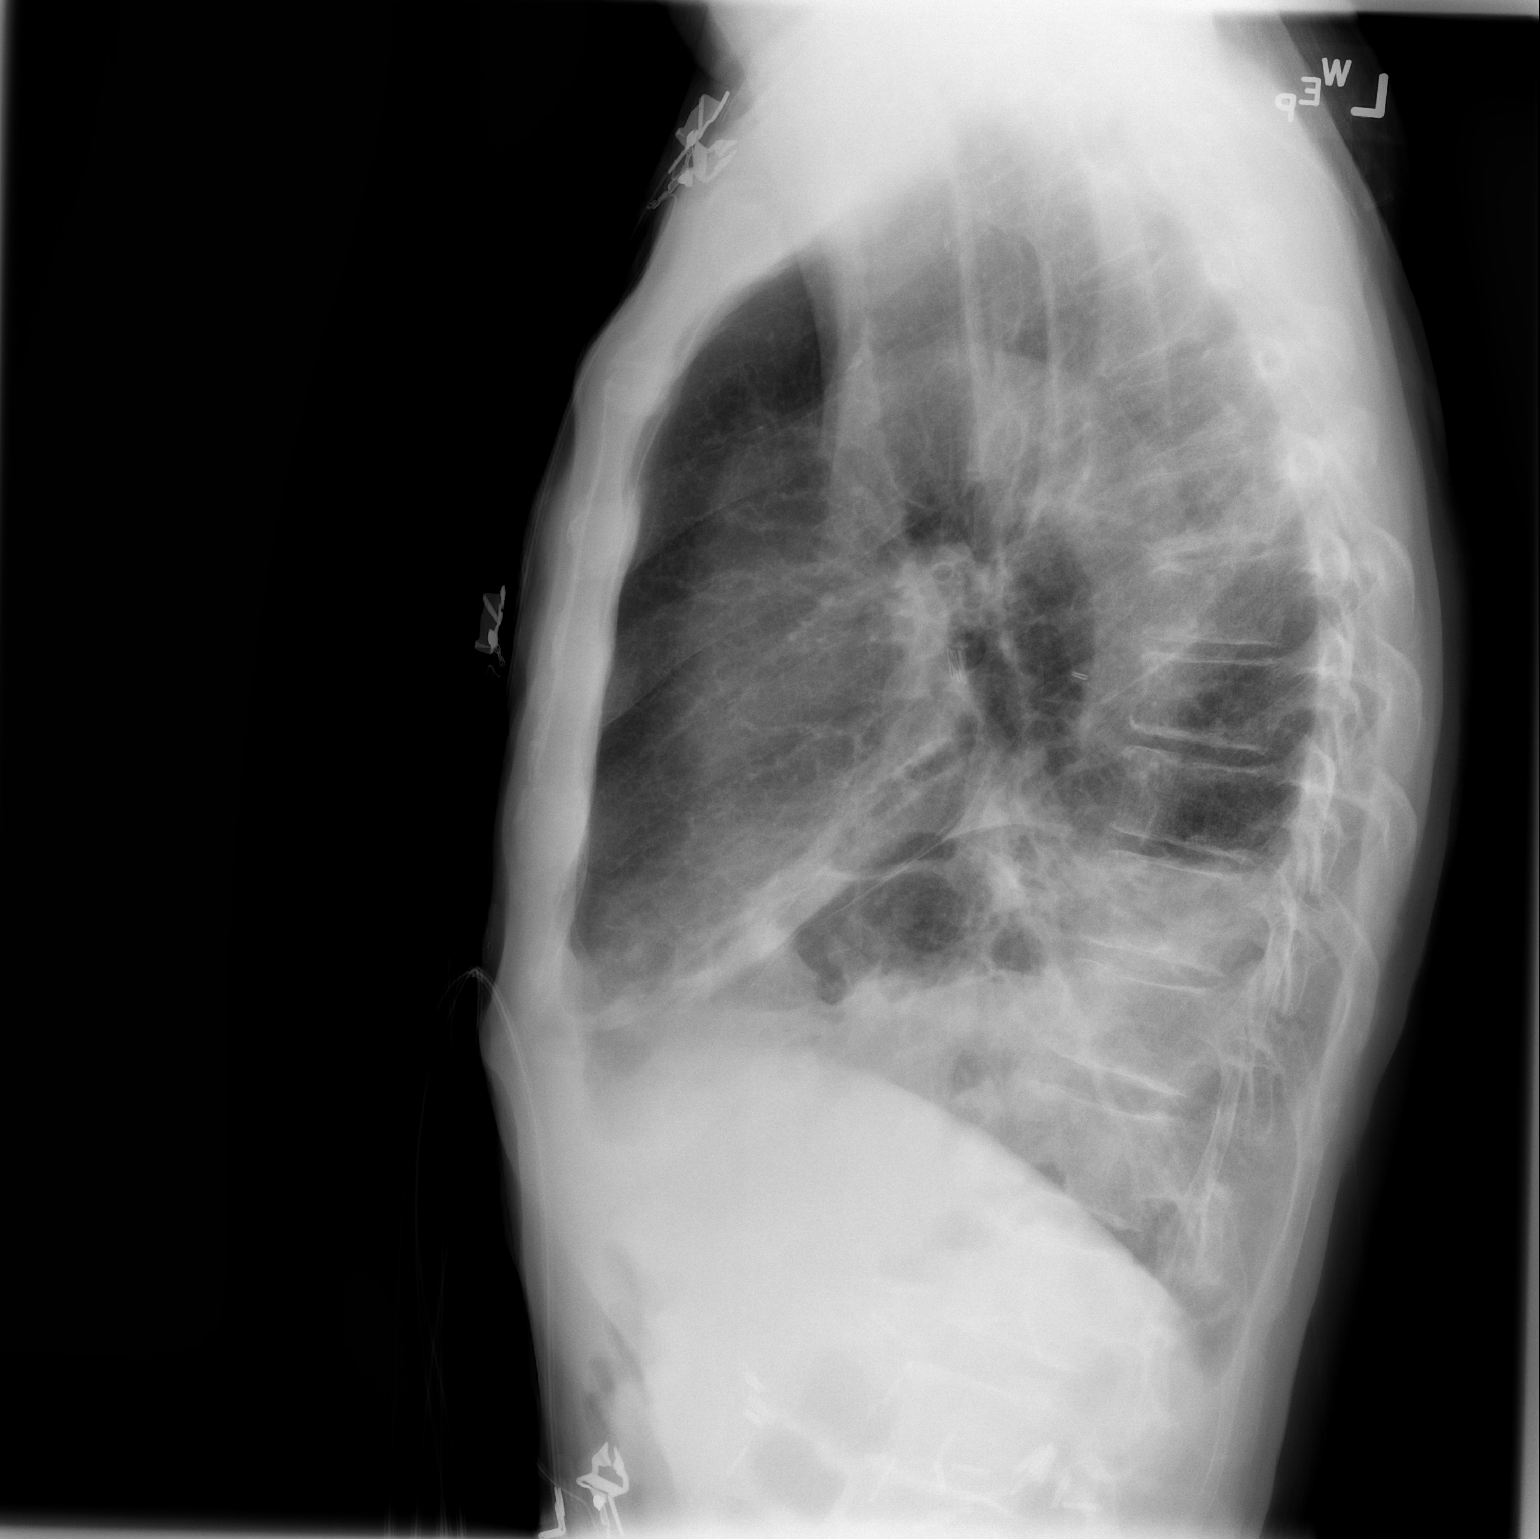

[2 of 2 positions shown; findings below may reference images not displayed]

FINDINGS: There is been some worsening of the opacity in the left
lower lobe most consistent with pneumonia and possible left
effusion.  Patchy opacity in the right mid upper lung field is
stable.  The lungs remain hyperaerated consistent with COPD.  The
heart is within normal limits in size. No bony abnormality is seen.
IMPRESSION: Increasing opacity at the left base most consistent with left lower
lobe pneumonia.  No change in patchy opacity in the right mid upper
lung field.

## 2009-08-31 IMAGING — CR DG CHEST 2V
2 series · 2 of 2 positions shown · non-contrast
Comparison: Chest x-ray of 06/11/2009

CLINICAL DATA: Persistent cough, follow-up pneumonia

CHEST - 2 VIEW

[w chest pa]
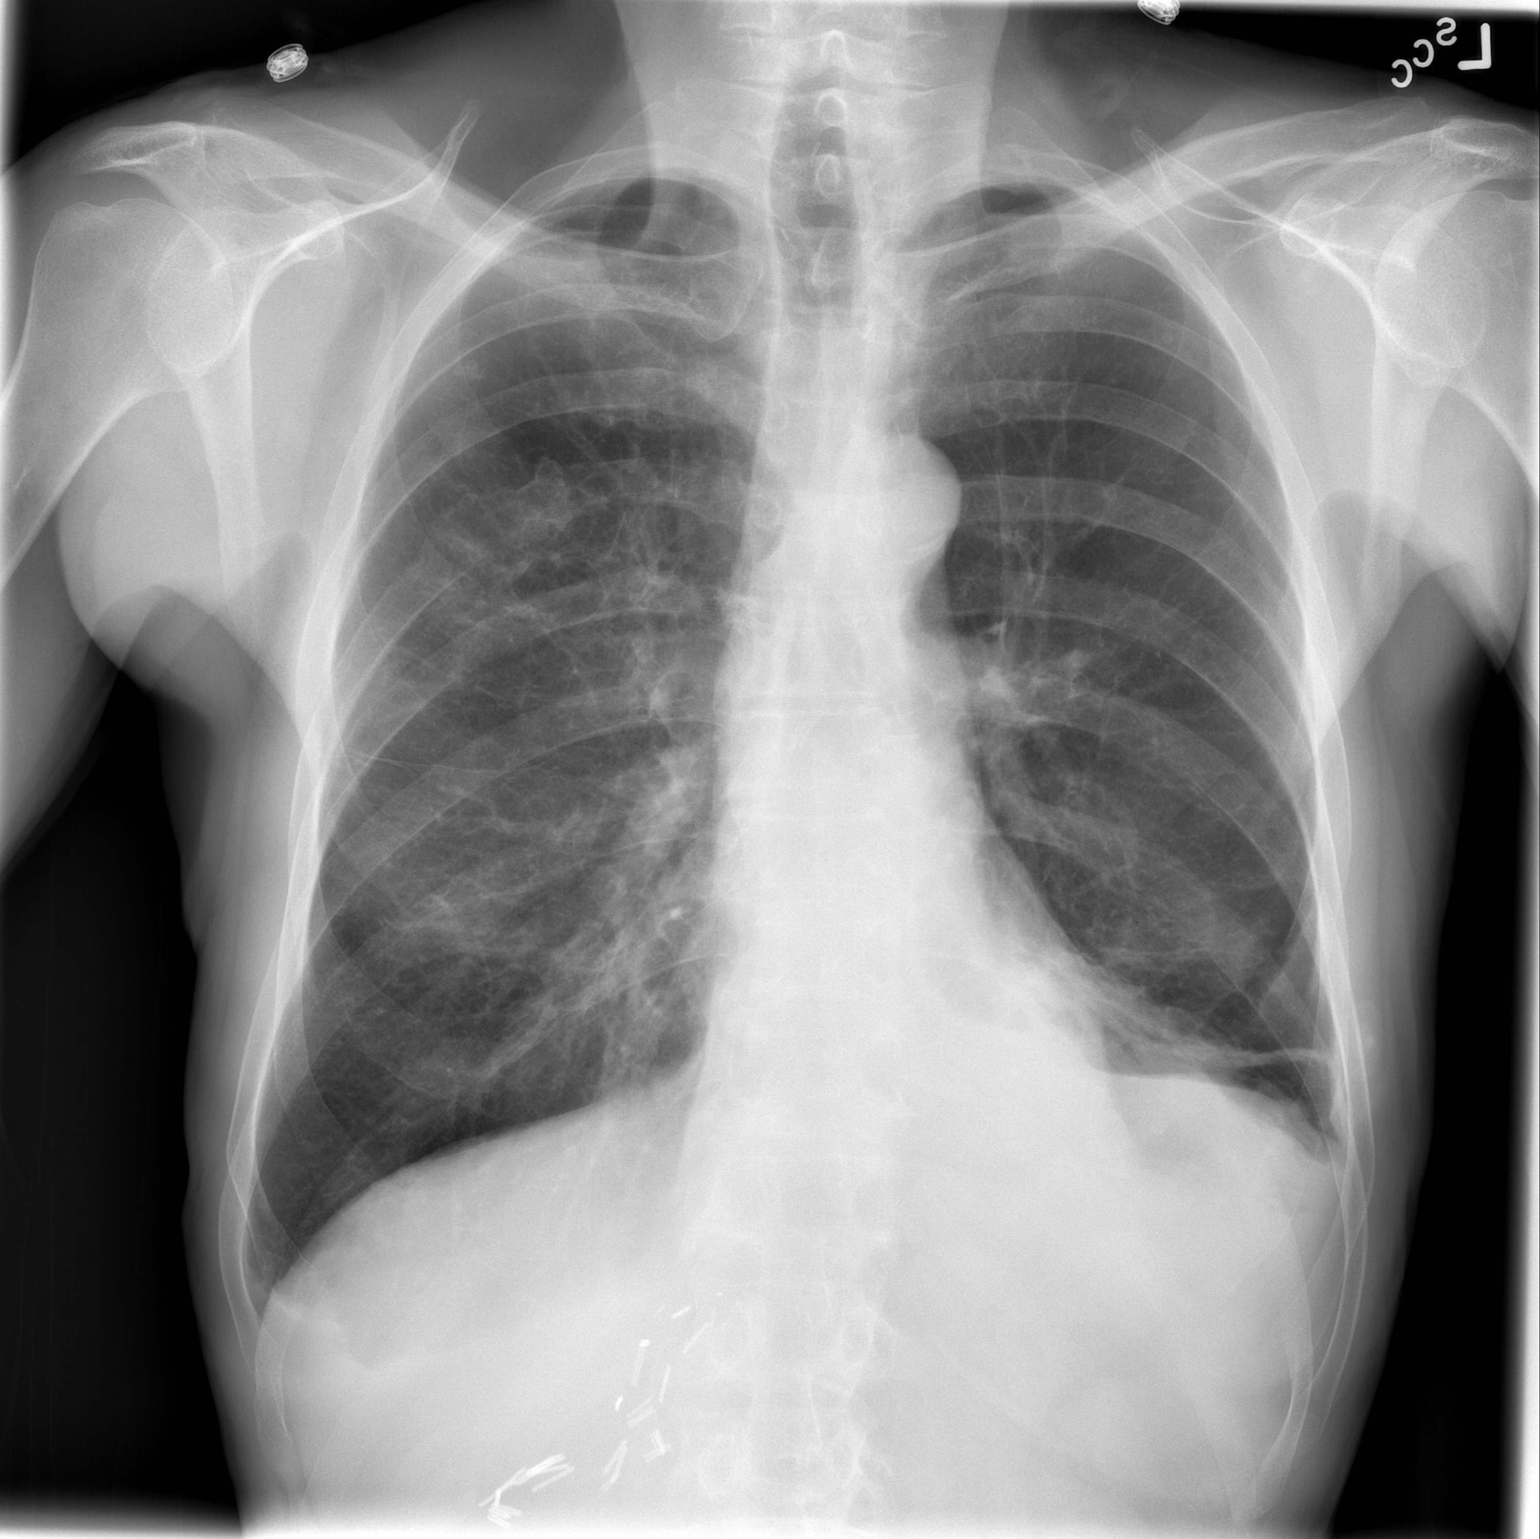

[w chest lat]
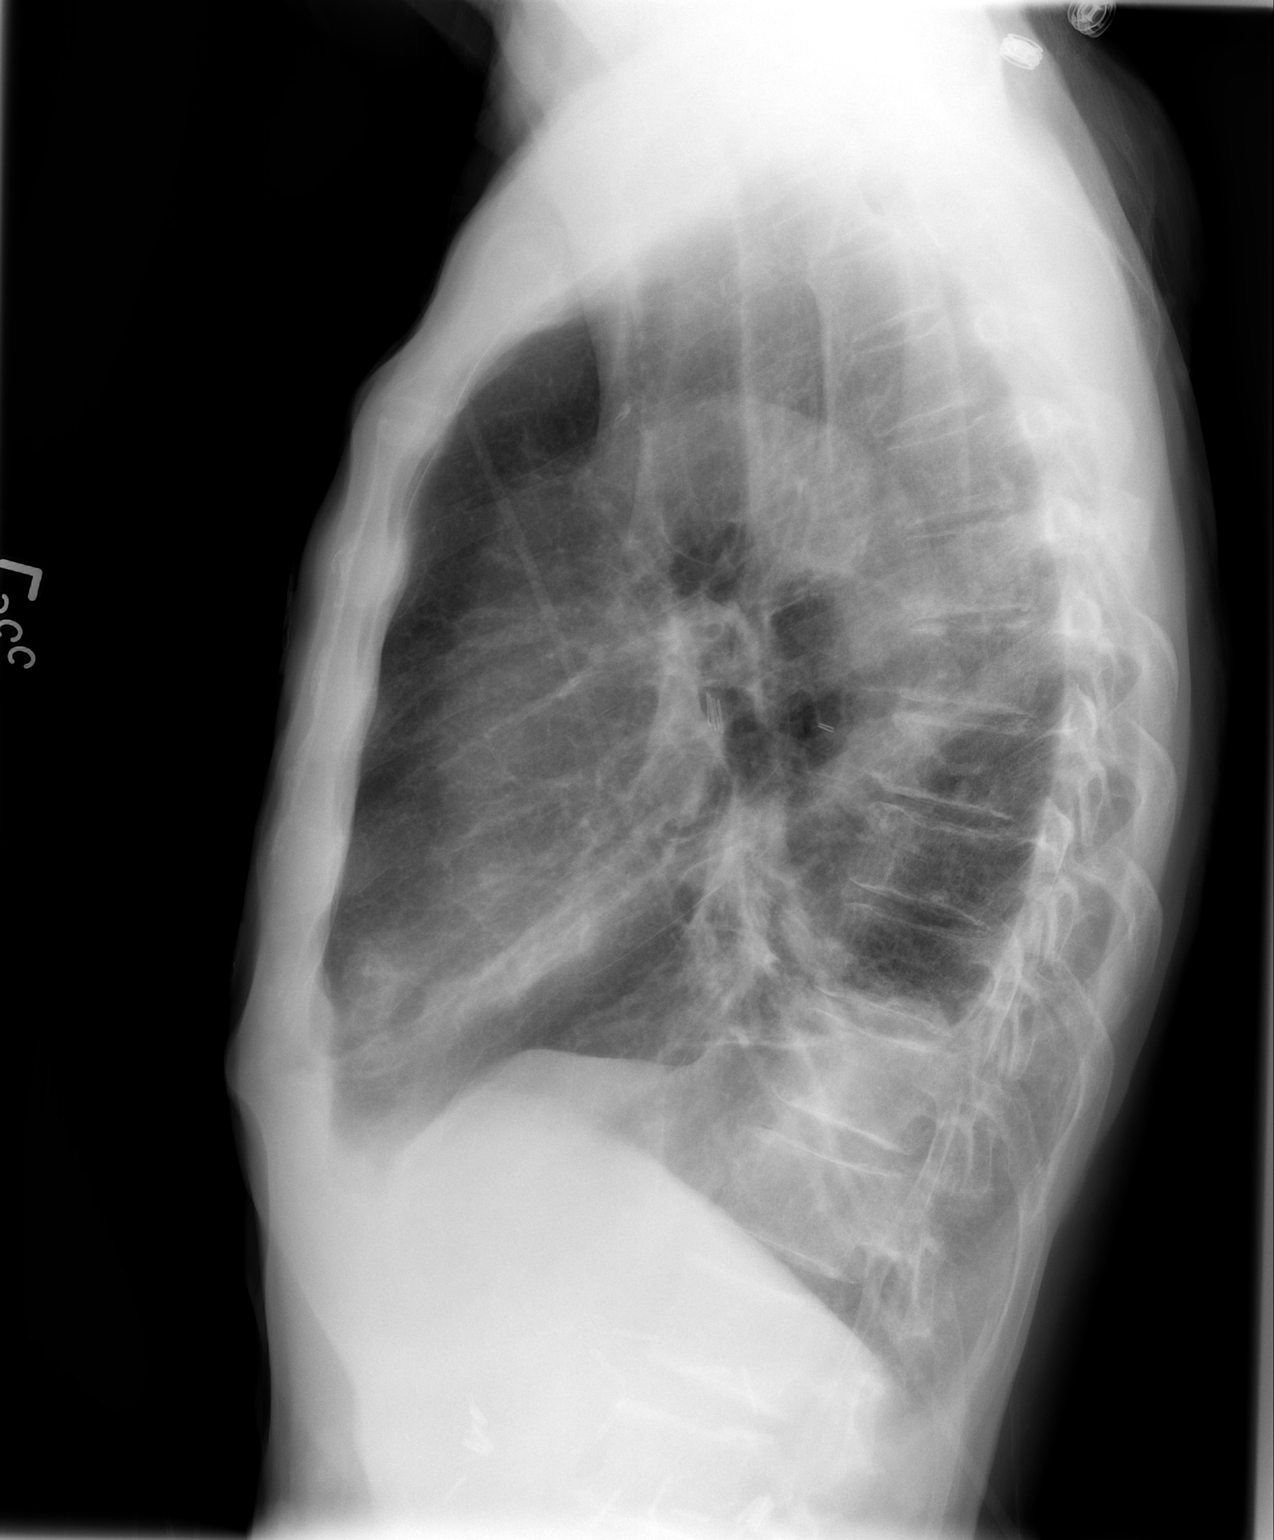

[2 of 2 positions shown; findings below may reference images not displayed]

FINDINGS: There is only been slight improvement left lower lobe and
lingular opacity consistent with pneumonia and volume loss.
Minimal patchy opacity remains within the right mid upper lung
field as well.  The lungs hyperaerated.  Heart size is stable.
IMPRESSION: Minimal improvement in the left lower lobe and lingular pneumonia.
Slight patchy opacity remains in the right upper lung field as
well.

## 2009-09-16 IMAGING — CR DG CHEST 2V
3 series · 3 of 3 positions shown · non-contrast
Comparison: 06/13/2009 and CT chest 06/09/2009

CLINICAL DATA: Right lung surgery with persistent cough.
Pneumonia.

CHEST - 2 VIEW

[view not recorded (1 of 3)]
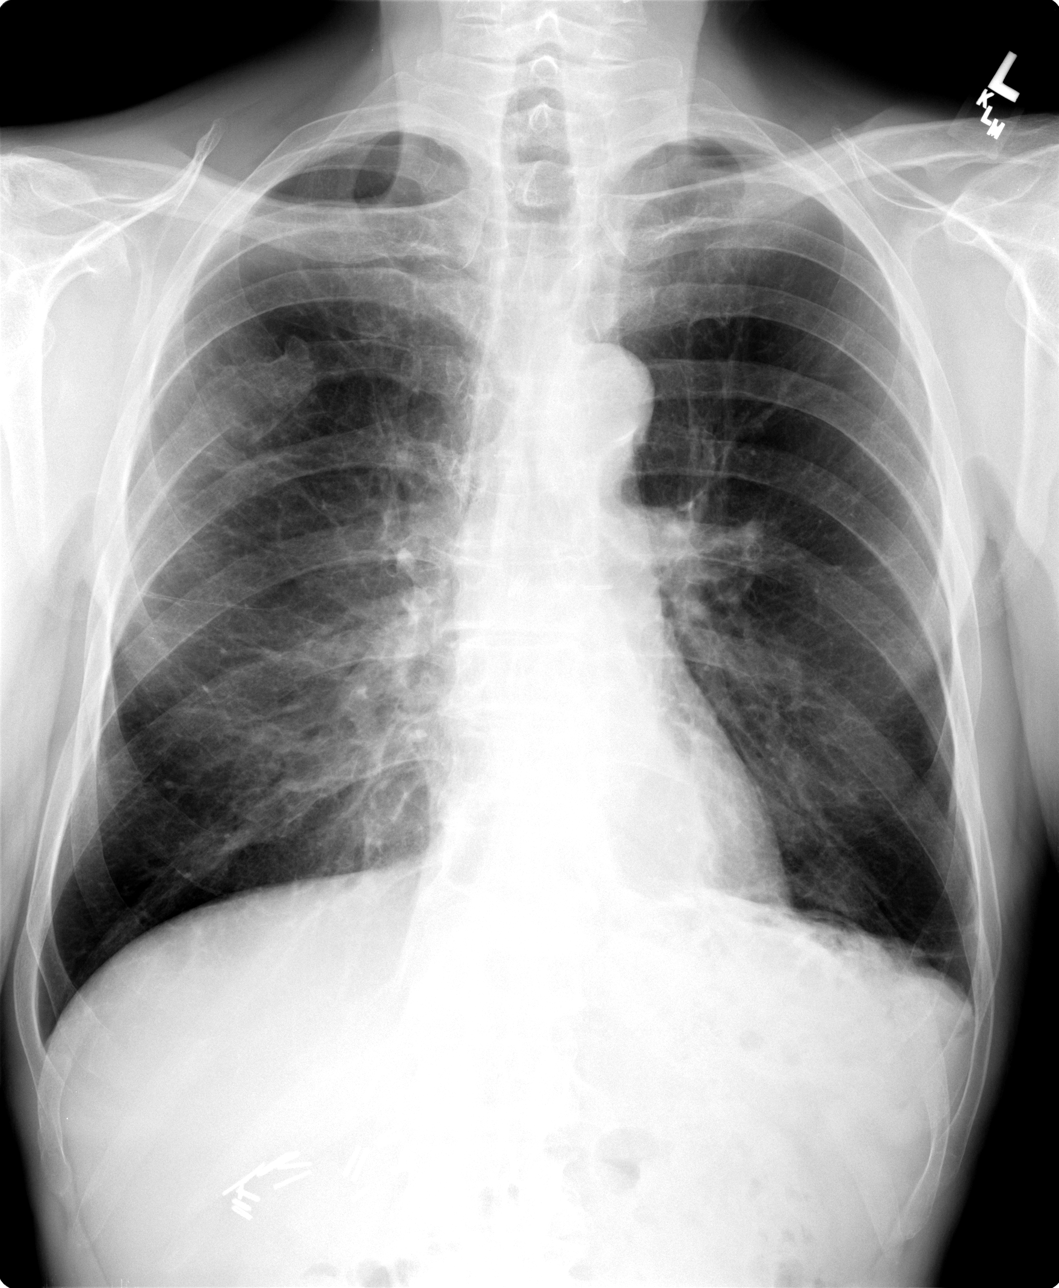

[view not recorded (2 of 3)]
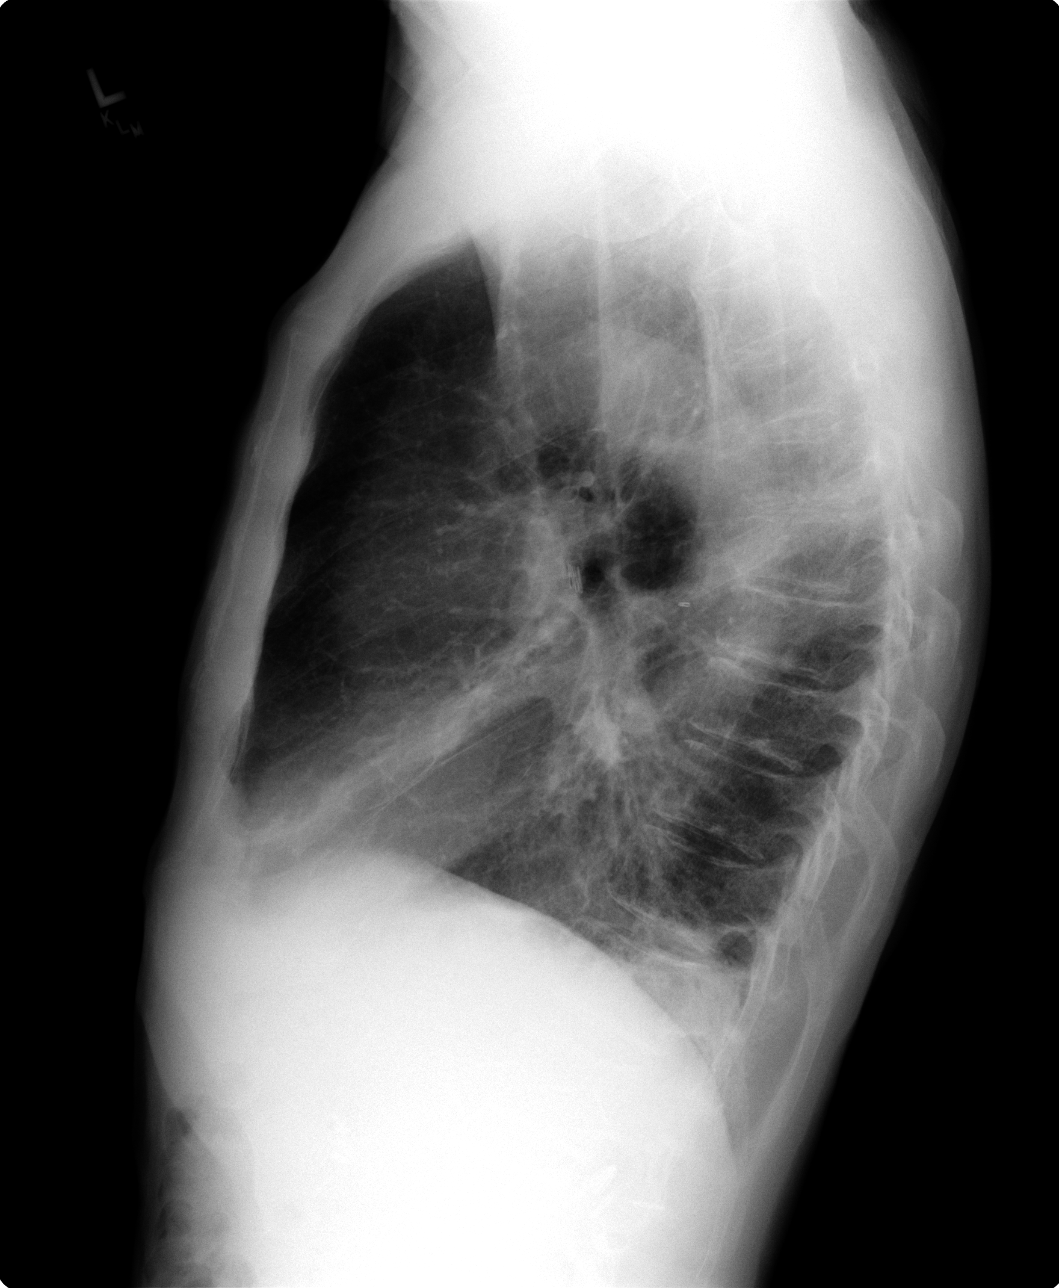

[view not recorded (3 of 3)]
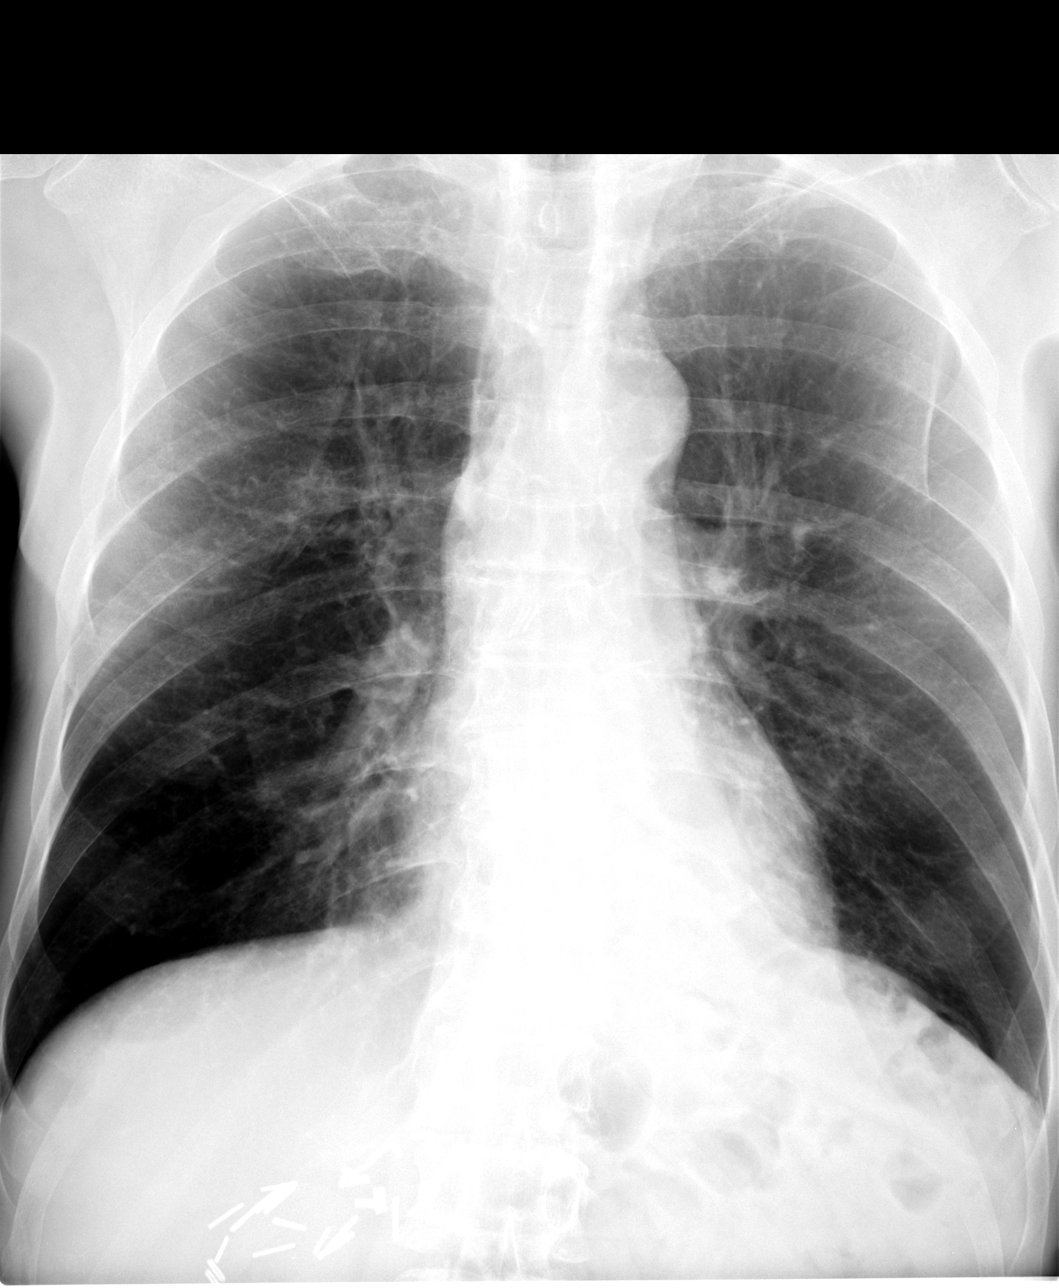

[3 of 3 positions shown; findings below may reference images not displayed]

FINDINGS: Trachea is midline.  Heart size normal.  Thoracic aorta
is calcified. Residual air space disease is seen in the right
middle lobe and at the base of the left hemithorax, although
improved from 06/13/2009.

Upon review of CT chest 06/09/2009, a large left diaphragmatic
hernia was seen at that time, with herniation of colon and small
bowel into the left chest.  However, these findings are not readily
visualized on multiple upright chest radiographs, but rather only
on CT, when the patient is in a supine position.  As such, an
additional AP radiograph was performed in the supine position,
which shows herniation of bowel into the lower medial left chest.
IMPRESSION: 1.  Herniation of small bowel and colon through a defect in the
left hemidiaphragm appears to occur most readily in the supine
position, when comparison with previous CT exams is performed.
Please see discussion above.
2.  Residual right middle lobe and left basilar airspace disease.

## 2009-10-12 ENCOUNTER — Encounter: Admission: RE | Admit: 2009-10-12 | Discharge: 2009-10-12 | Payer: Self-pay | Admitting: Thoracic Surgery

## 2009-10-12 ENCOUNTER — Ambulatory Visit: Payer: Self-pay | Admitting: Thoracic Surgery

## 2009-10-13 ENCOUNTER — Inpatient Hospital Stay (HOSPITAL_COMMUNITY): Admission: EM | Admit: 2009-10-13 | Discharge: 2009-10-20 | Payer: Self-pay | Admitting: Emergency Medicine

## 2009-10-22 ENCOUNTER — Inpatient Hospital Stay (HOSPITAL_COMMUNITY): Admission: EM | Admit: 2009-10-22 | Discharge: 2009-11-04 | Payer: Self-pay | Admitting: Emergency Medicine

## 2009-10-23 ENCOUNTER — Ambulatory Visit: Payer: Self-pay | Admitting: Thoracic Surgery

## 2009-10-27 ENCOUNTER — Encounter (INDEPENDENT_AMBULATORY_CARE_PROVIDER_SITE_OTHER): Payer: Self-pay

## 2009-10-27 HISTORY — PX: OTHER SURGICAL HISTORY: SHX169

## 2009-11-09 ENCOUNTER — Encounter: Admission: RE | Admit: 2009-11-09 | Discharge: 2009-11-09 | Payer: Self-pay | Admitting: General Surgery

## 2009-12-30 IMAGING — CR DG CHEST 2V
2 series · 2 of 2 positions shown · non-contrast
Comparison: Chest x-ray of 06/29/2009 and CT chest of 06/09/2009

CLINICAL DATA: Right lung surgery with persistent cough, left
shoulder pain and shortness of breath

CHEST - 2 VIEW

[w chest pa]
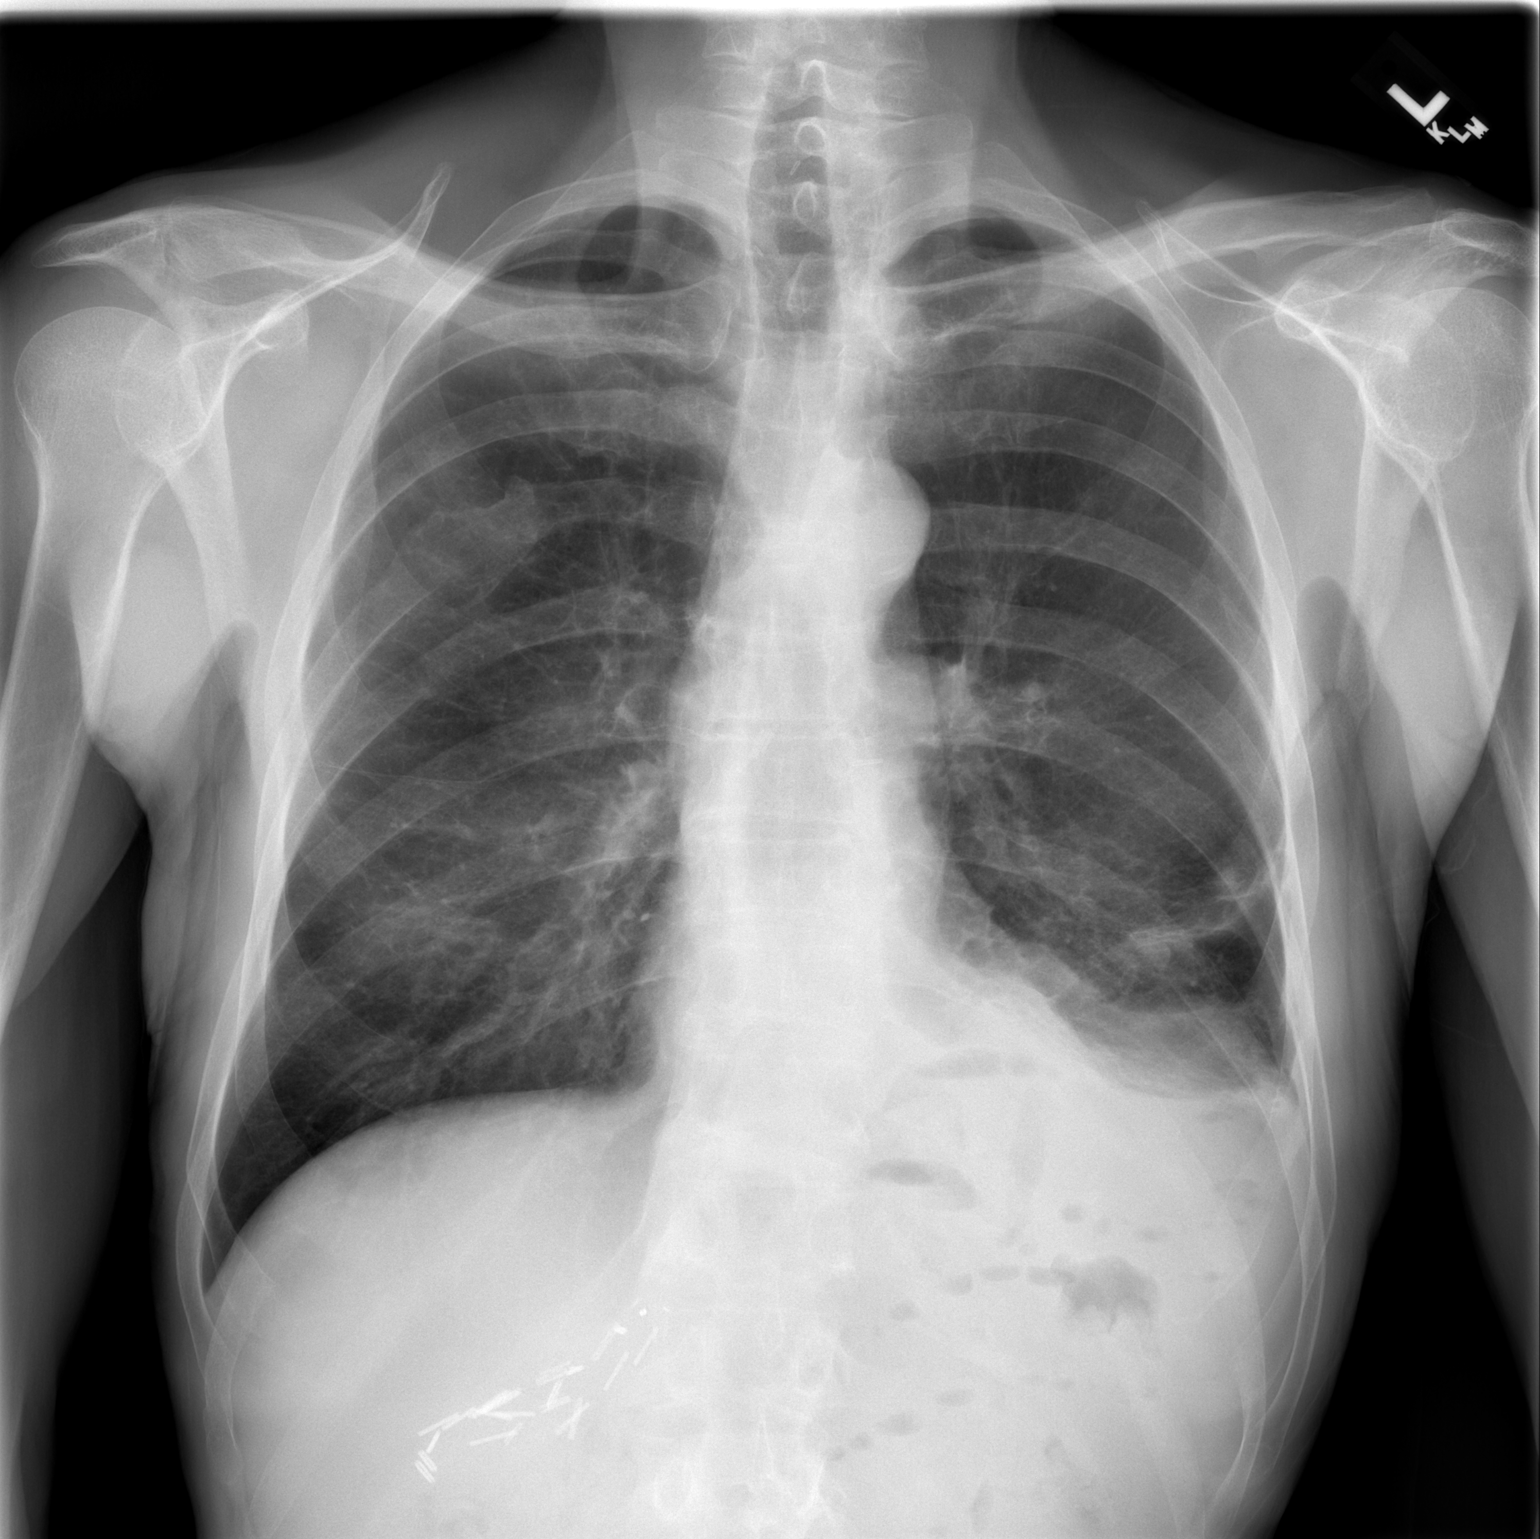

[w chest lat]
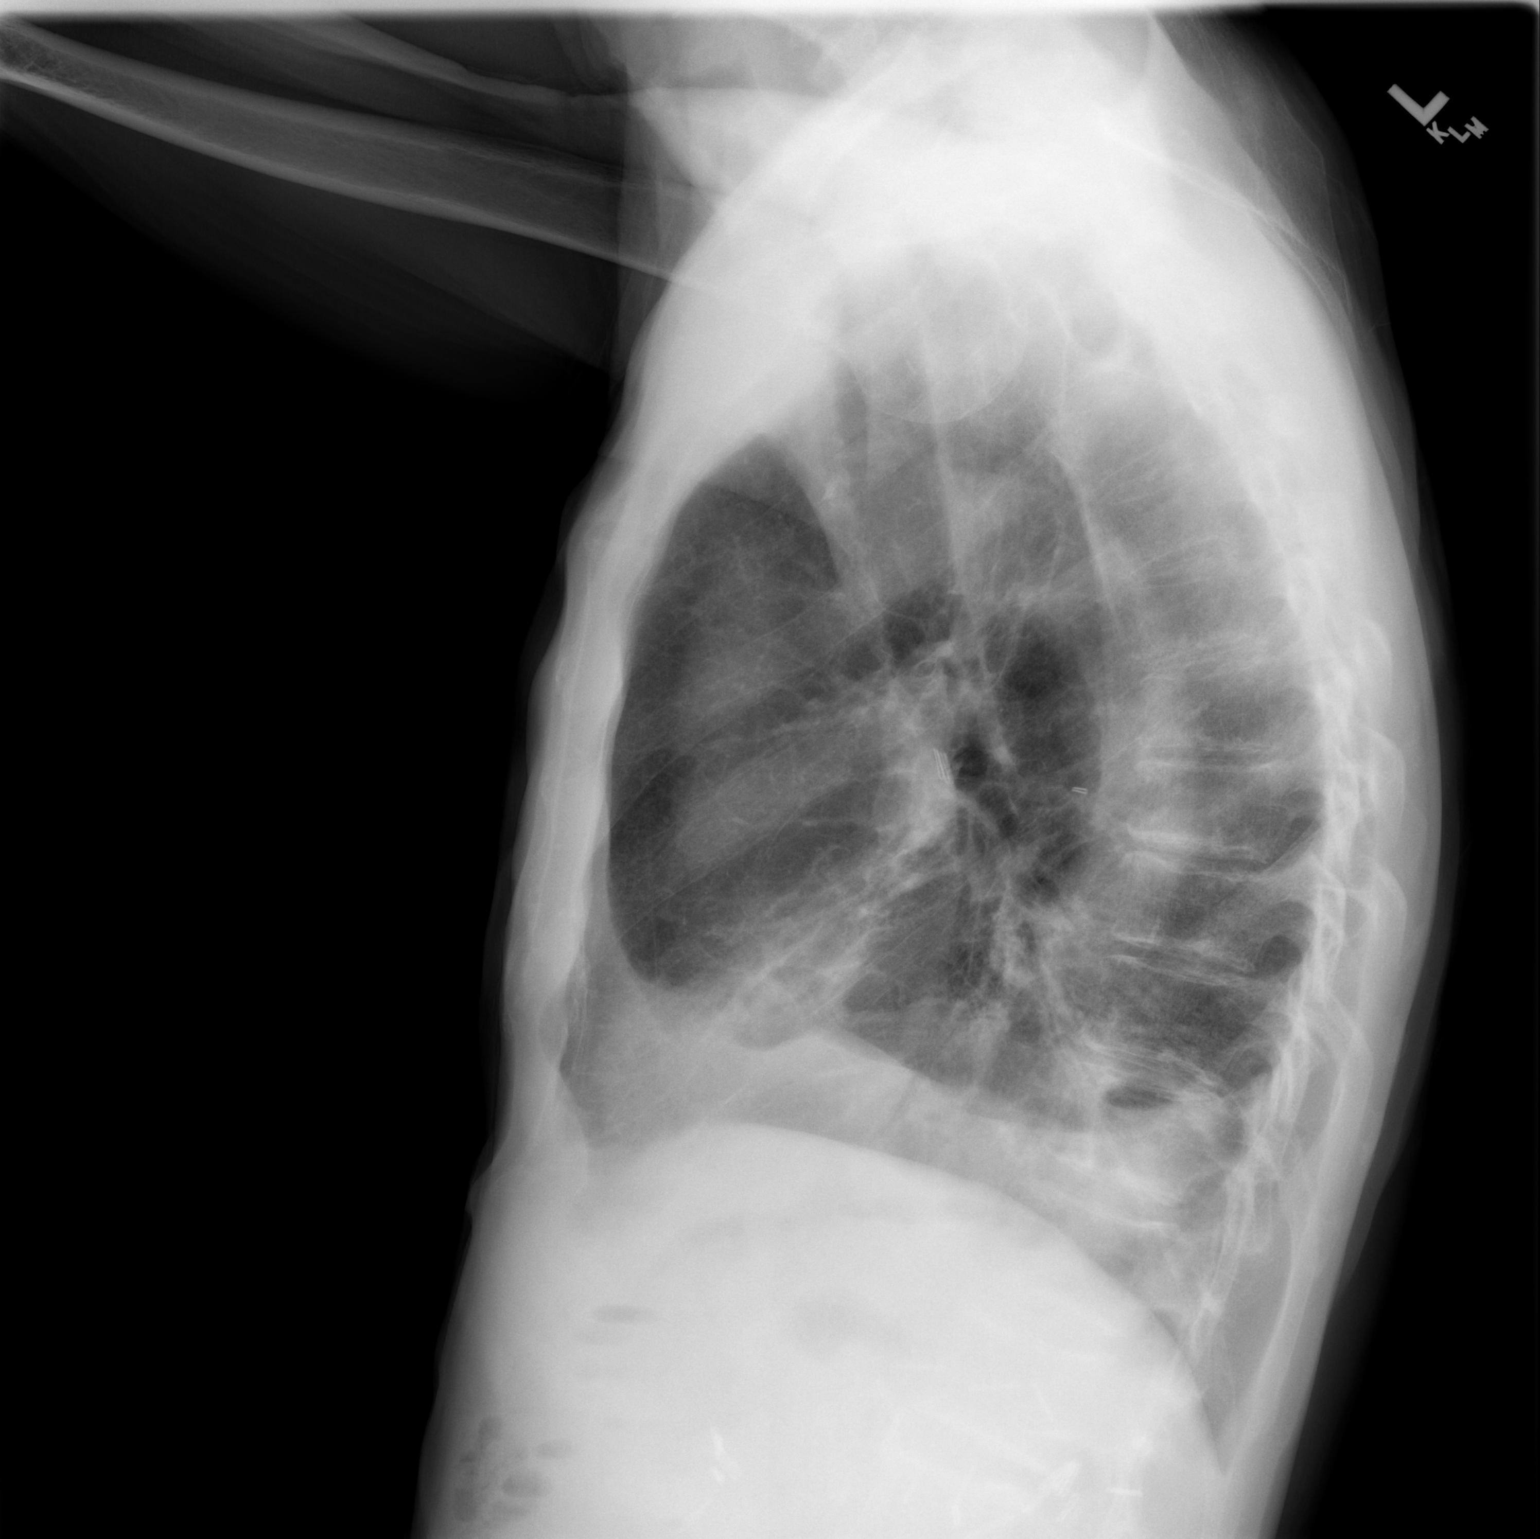

[2 of 2 positions shown; findings below may reference images not displayed]

FINDINGS: Opacity at the left lung base again is noted.  Some of
this opacity is due to atelectasis and probable small left
effusion.  Previously there has been noted to be a defect within
the left hemidiaphragm with herniation of bowel into the left lower
chest which is not as apparent by chest x-ray.  The right lung is
clear.  The heart is within normal limits in size.  Old fracture
deformity of the posterior right sixth rib is noted.  Surgical
clips are present in the right upper quadrant.
IMPRESSION: Opacity at the left lung base may be due to atelectasis and
effusion as well as the previously demonstrated diaphragmatic
defect with bowel entering the lower left chest.

## 2009-12-31 IMAGING — CT CT ABDOMEN W/ CM
2 of 5 series · 16 of 46 positions shown, 18 images · IV contrast (APPLIED)
Comparison: Chest, abdomen and pelvic CT 06/09/2009.

CT ABDOMEN

CLINICAL DATA: Severe abdominal pain with nausea and vomiting for
3 days.  History of esophageal cancer with surgery and radiation
therapy.

CT ABDOMEN AND PELVIS WITH CONTRAST
TECHNIQUE: Multidetector CT imaging of the abdomen and pelvis was
performed using the standard protocol following bolus
administration of intravenous contrast.
Contrast: 80 ml Imnipaque-400 intravenously.

[Series 2: abd/pelv with 5.0 b31f st · axial · 0.64mm/px · z∈[-488,-78]mm · 13 of 92 slices shown, 15 images]
[im 5/92  soft-tissue]
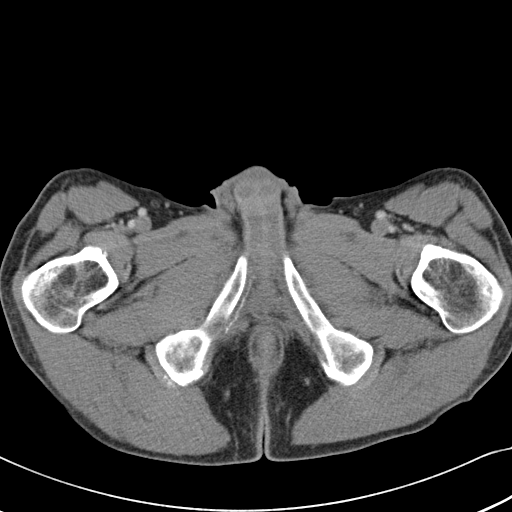
[im 5/92  bone]
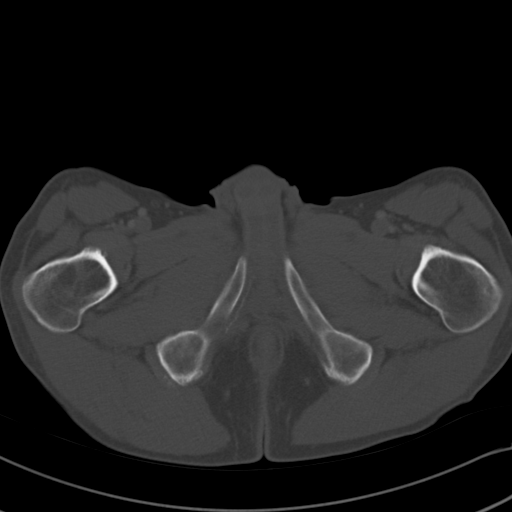
[im 15/92  soft-tissue]
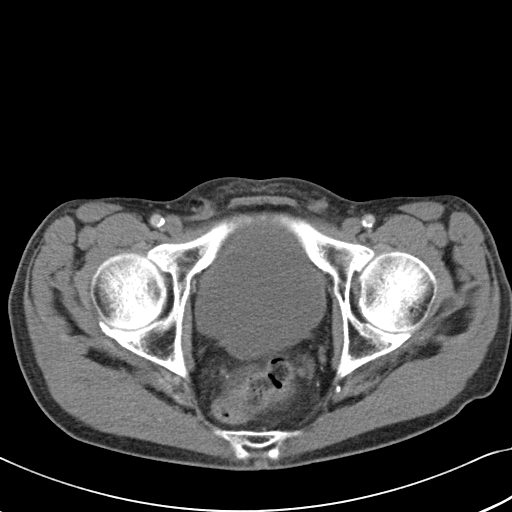
[im 20/92  soft-tissue]
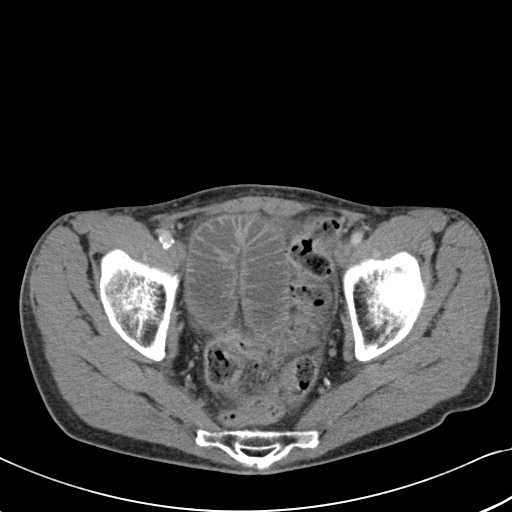
[im 24/92  soft-tissue]
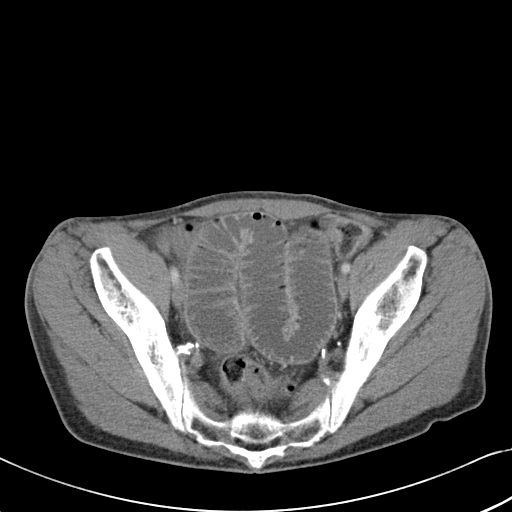
[im 34/92  soft-tissue]
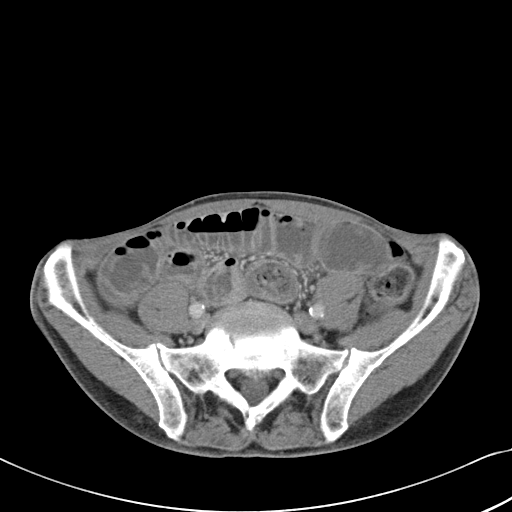
[im 39/92  soft-tissue]
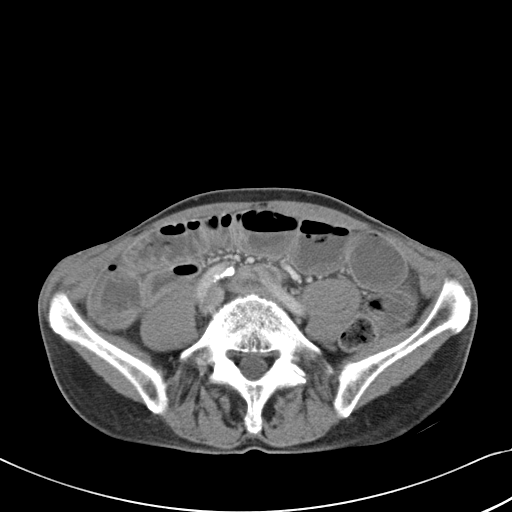
[im 48/92  soft-tissue]
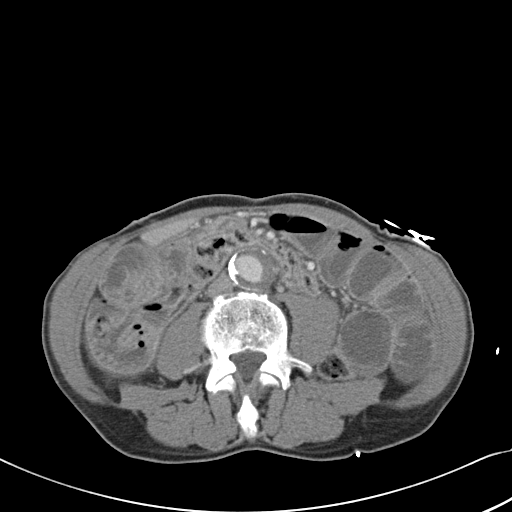
[im 53/92  soft-tissue]
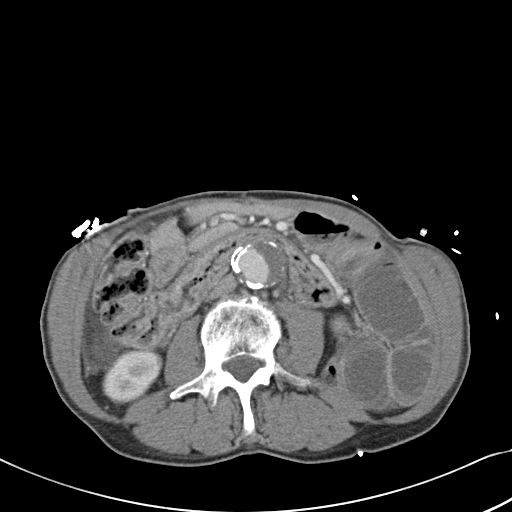
[im 58/92  soft-tissue]
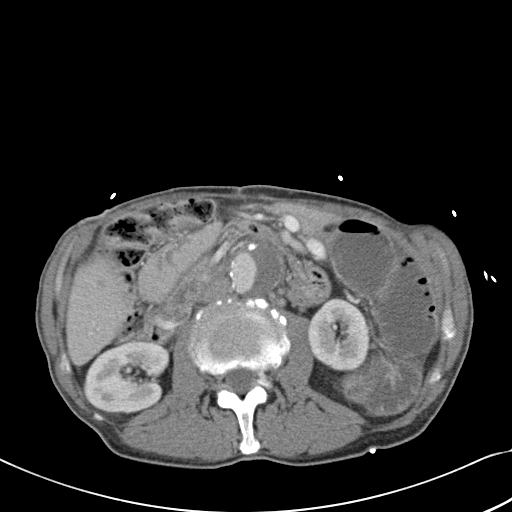
[im 58/92  bone]
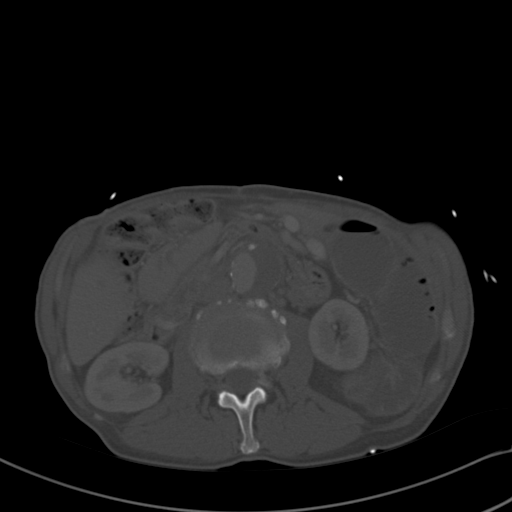
[im 68/92  soft-tissue]
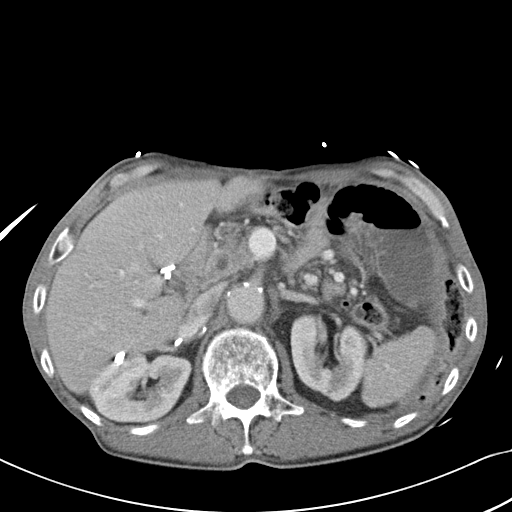
[im 72/92  soft-tissue]
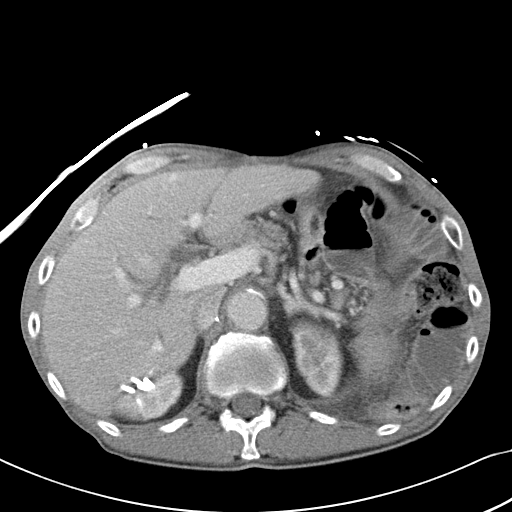
[im 77/92  soft-tissue]
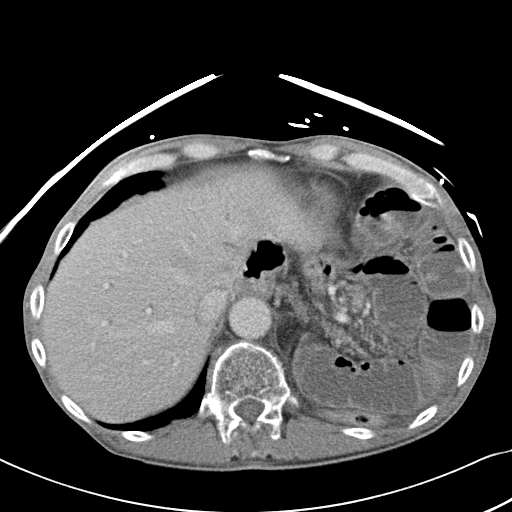
[im 87/92  soft-tissue]
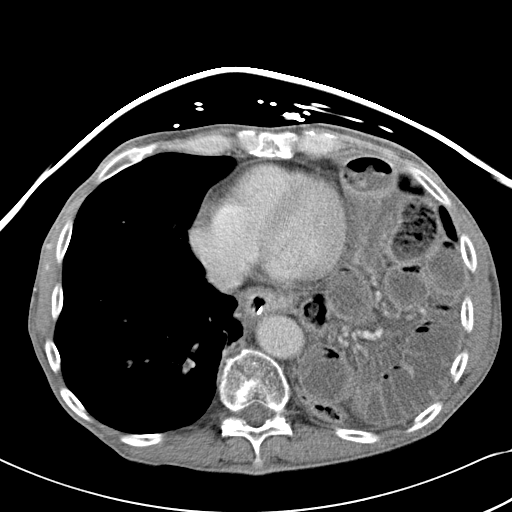

[Series 602: cor a/p · coronal · 0.90mm/px · 3 of 92 slices shown]
[im 31/92  soft-tissue]
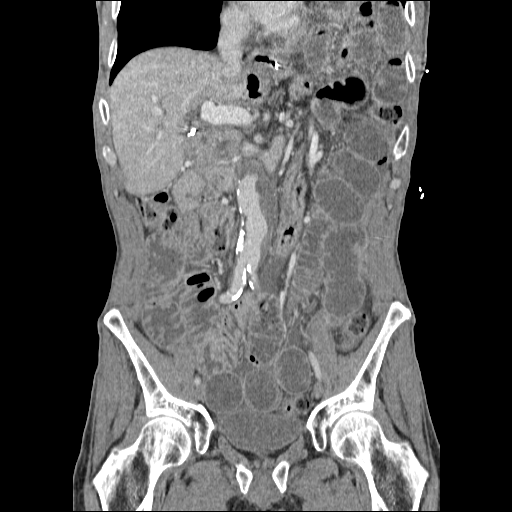
[im 41/92  soft-tissue]
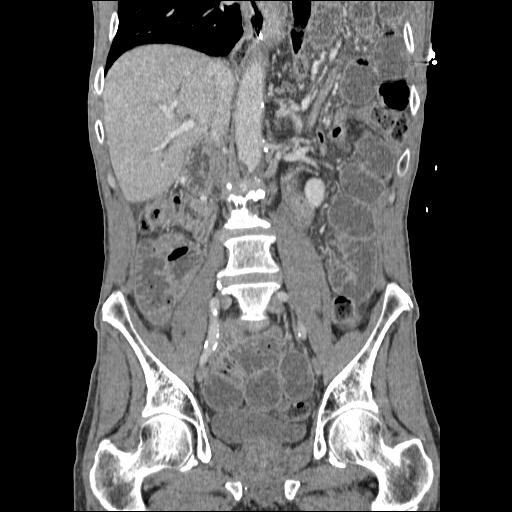
[im 51/92  soft-tissue]
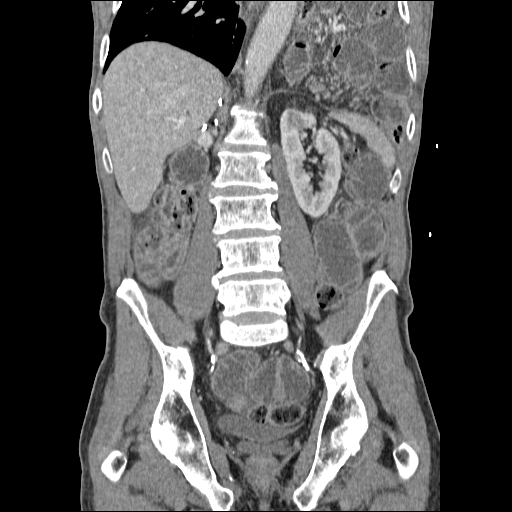

[16 of 46 positions shown; findings below may reference images not displayed]

FINDINGS: The left lung base is incompletely imaged due to the
presence of a progressive large left diaphragmatic hernia.  There
is apparent increased patchy airspace disease in the lingula.  The
right lung base is clear.

A nasogastric tube extends to the GE junction.  The stomach is
decompressed.  The herniated loops of small bowel in the left lower
chest are now moderately dilated and fluid-filled.  The splenic
flexure of the colon and the pancreatic tail are also herniated
through this defect.  The small bowel in the abdomen also appears
diffusely dilated, although the terminal ileum may be decompressed.
No significant colonic distension is seen.  There is no definite
extraluminal fluid collection.  However, there is a small amount of
ascites.

The liver has a stable appearance without evidence of metastatic
disease.  Mild biliary dilatation appears stable status post
cholecystectomy.  The spleen, pancreas and left adrenal gland
appear normal.  The right adrenal gland has probably been resected.
Right renal cyst appears stable.  There is a stable partially
thrombosed 3.5 cm abdominal aortic aneurysm.
IMPRESSION: 1.  Distal small bowel obstruction, presumably within the enlarging
left diaphragmatic hernia.  No other explanation for this
obstruction is identified.
2.  No definite focal extraluminal fluid collection.  A small
amount of ascites is present.
3.  Increased probable left basilar atelectasis.
4.  No evidence of metastatic disease.

CT PELVIS
FINDINGS: There is dilated small bowel in the pelvis.  Stool is
noted within the colon which is relatively decompressed.  No
extraluminal fluid collection is identified.  There are no enlarged
pelvic lymph nodes or suspicious osseous lesions.
IMPRESSION: 1.  Dilated small bowel in the pelvis suspicious for a distal small
bowel obstruction.  See additional findings above.
2.  No focal extraluminal fluid collection.

Critical test results telephoned to Dr. Korrection at the time of
interpretation on 10/13/2009 at 9770 hours.

## 2010-01-01 IMAGING — CR DG ABDOMEN 2V
2 series · 2 of 2 positions shown · non-contrast
Comparison: The CT the abdomen pelvis of 10/13/2009

CLINICAL DATA: Small bowel obstruction

ABDOMEN - 2 VIEW

[w abdomen upright]
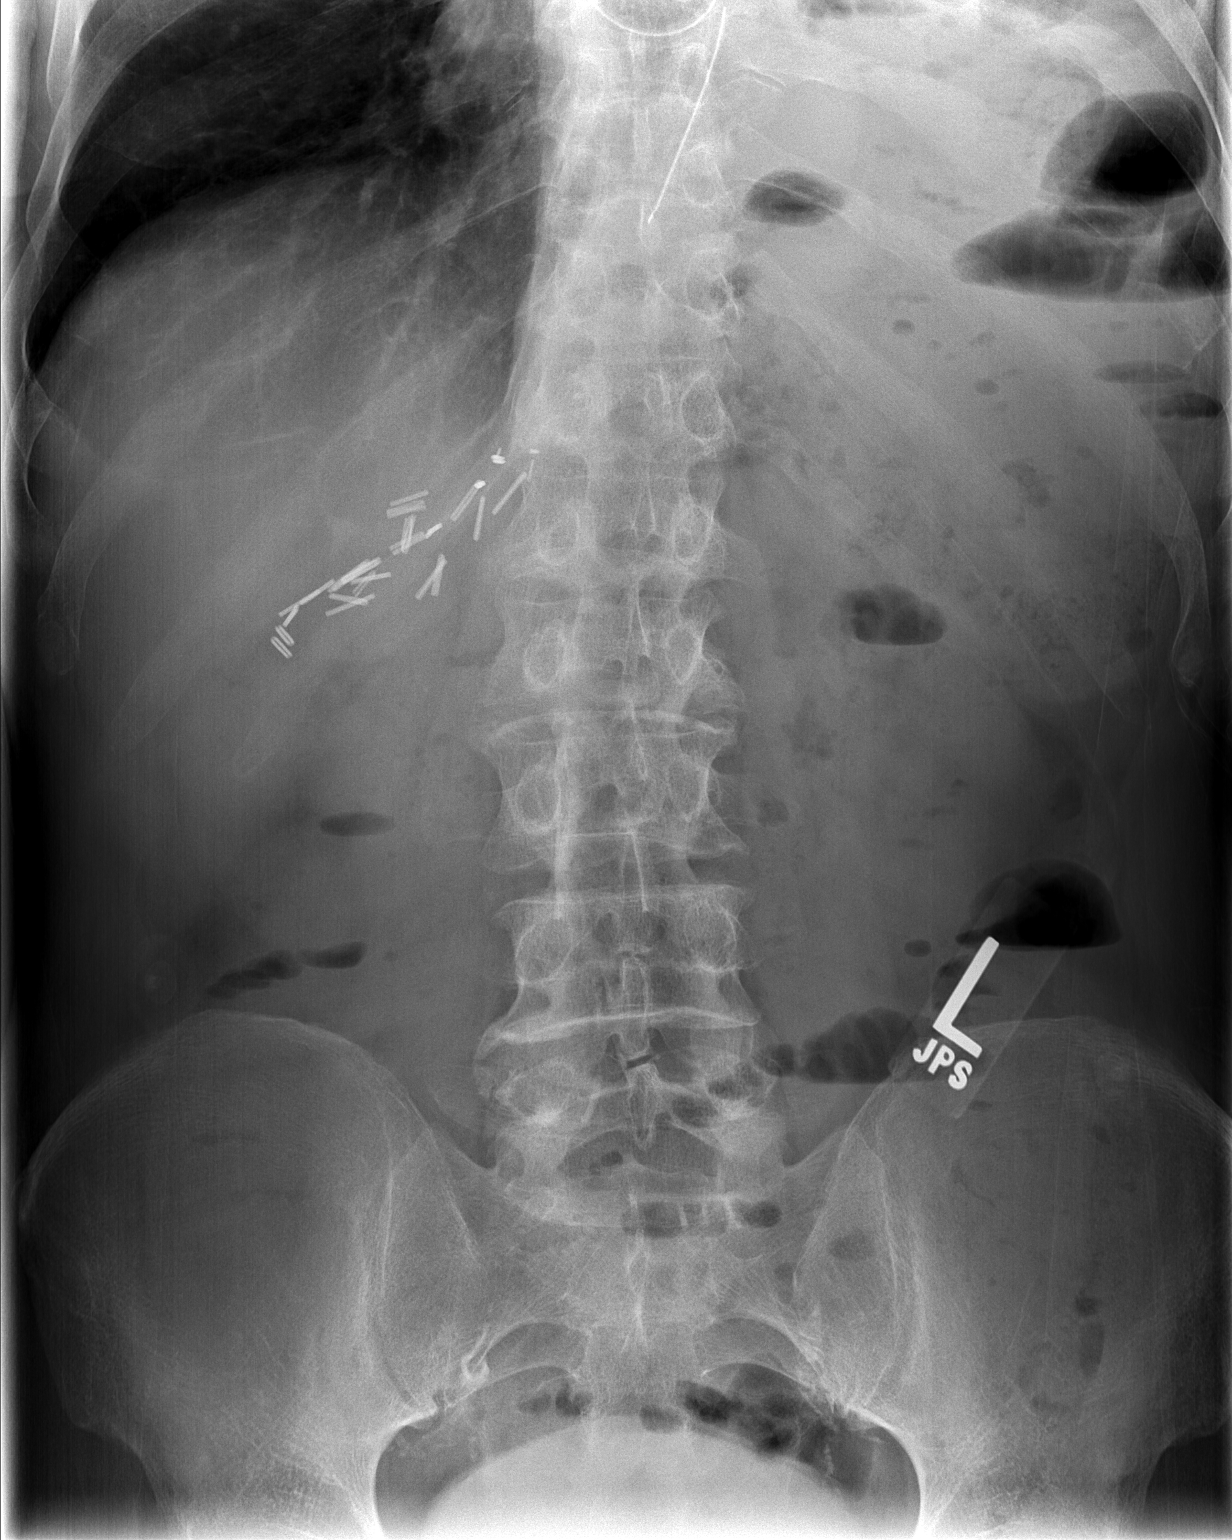

[t abdomen supine]
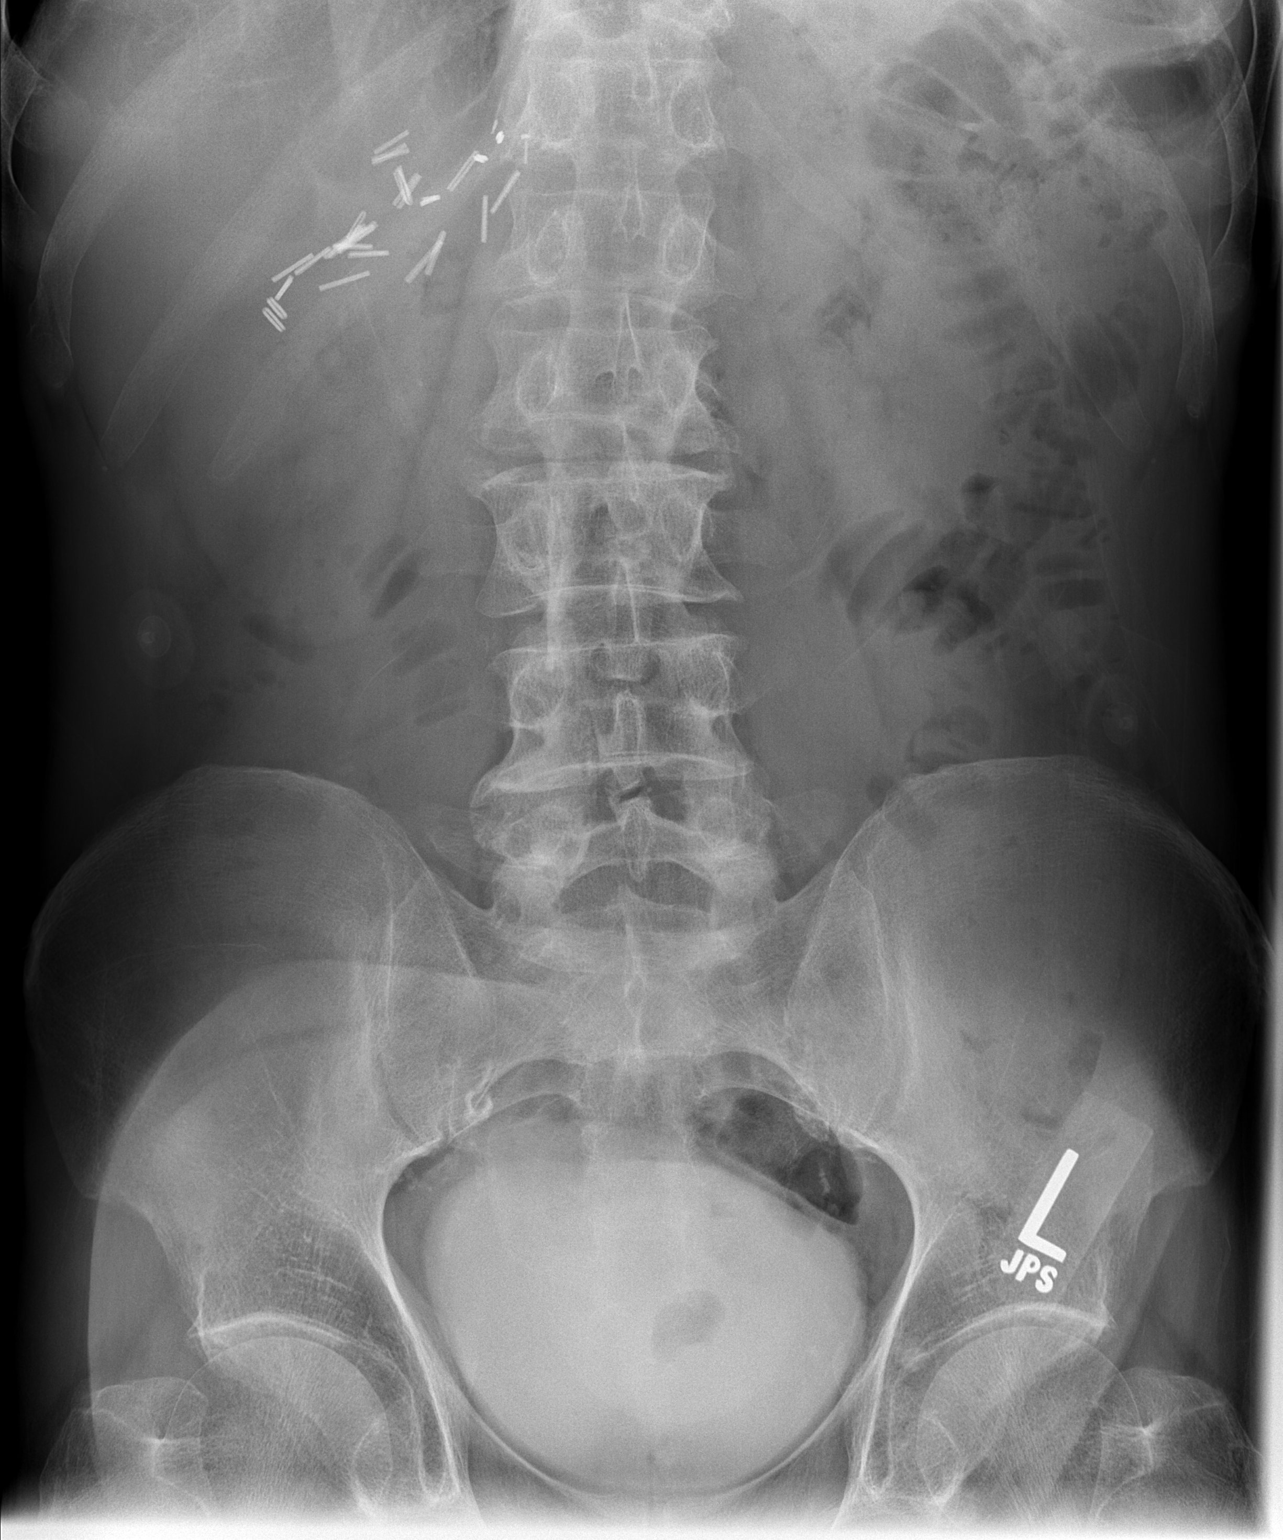

[2 of 2 positions shown; findings below may reference images not displayed]

FINDINGS: There are persistently dilated loops of small bowel with
air-fluid levels consistent with small bowel obstruction.  Much of
the small bowel appears to be filled with fluid.  Bowel extends
into the left upper chest no this CT demonstrated to be a
diaphragmatic hernia.  Surgical clips are present in the right
upper quadrant from prior cholecystectomy.  NG tube is noted with
the tip in the region of the GE junction and appears to be coiled
in the distal esophagus.
IMPRESSION: Persistent SBO pattern with bowel extending into the left chest as
noted by CT through a diaphragmatic hernia.  Tip of NG tube near GE
junction.

## 2010-01-01 IMAGING — CR DG CHEST 2V
2 series · 2 of 2 positions shown · non-contrast
Comparison: 10/12/2009 and earlier.

CLINICAL DATA: 68-year-old male with abdominal pain.  Small bowel
obstruction.  History of esophageal cancer.

CHEST - 2 VIEW

[w chest pa]
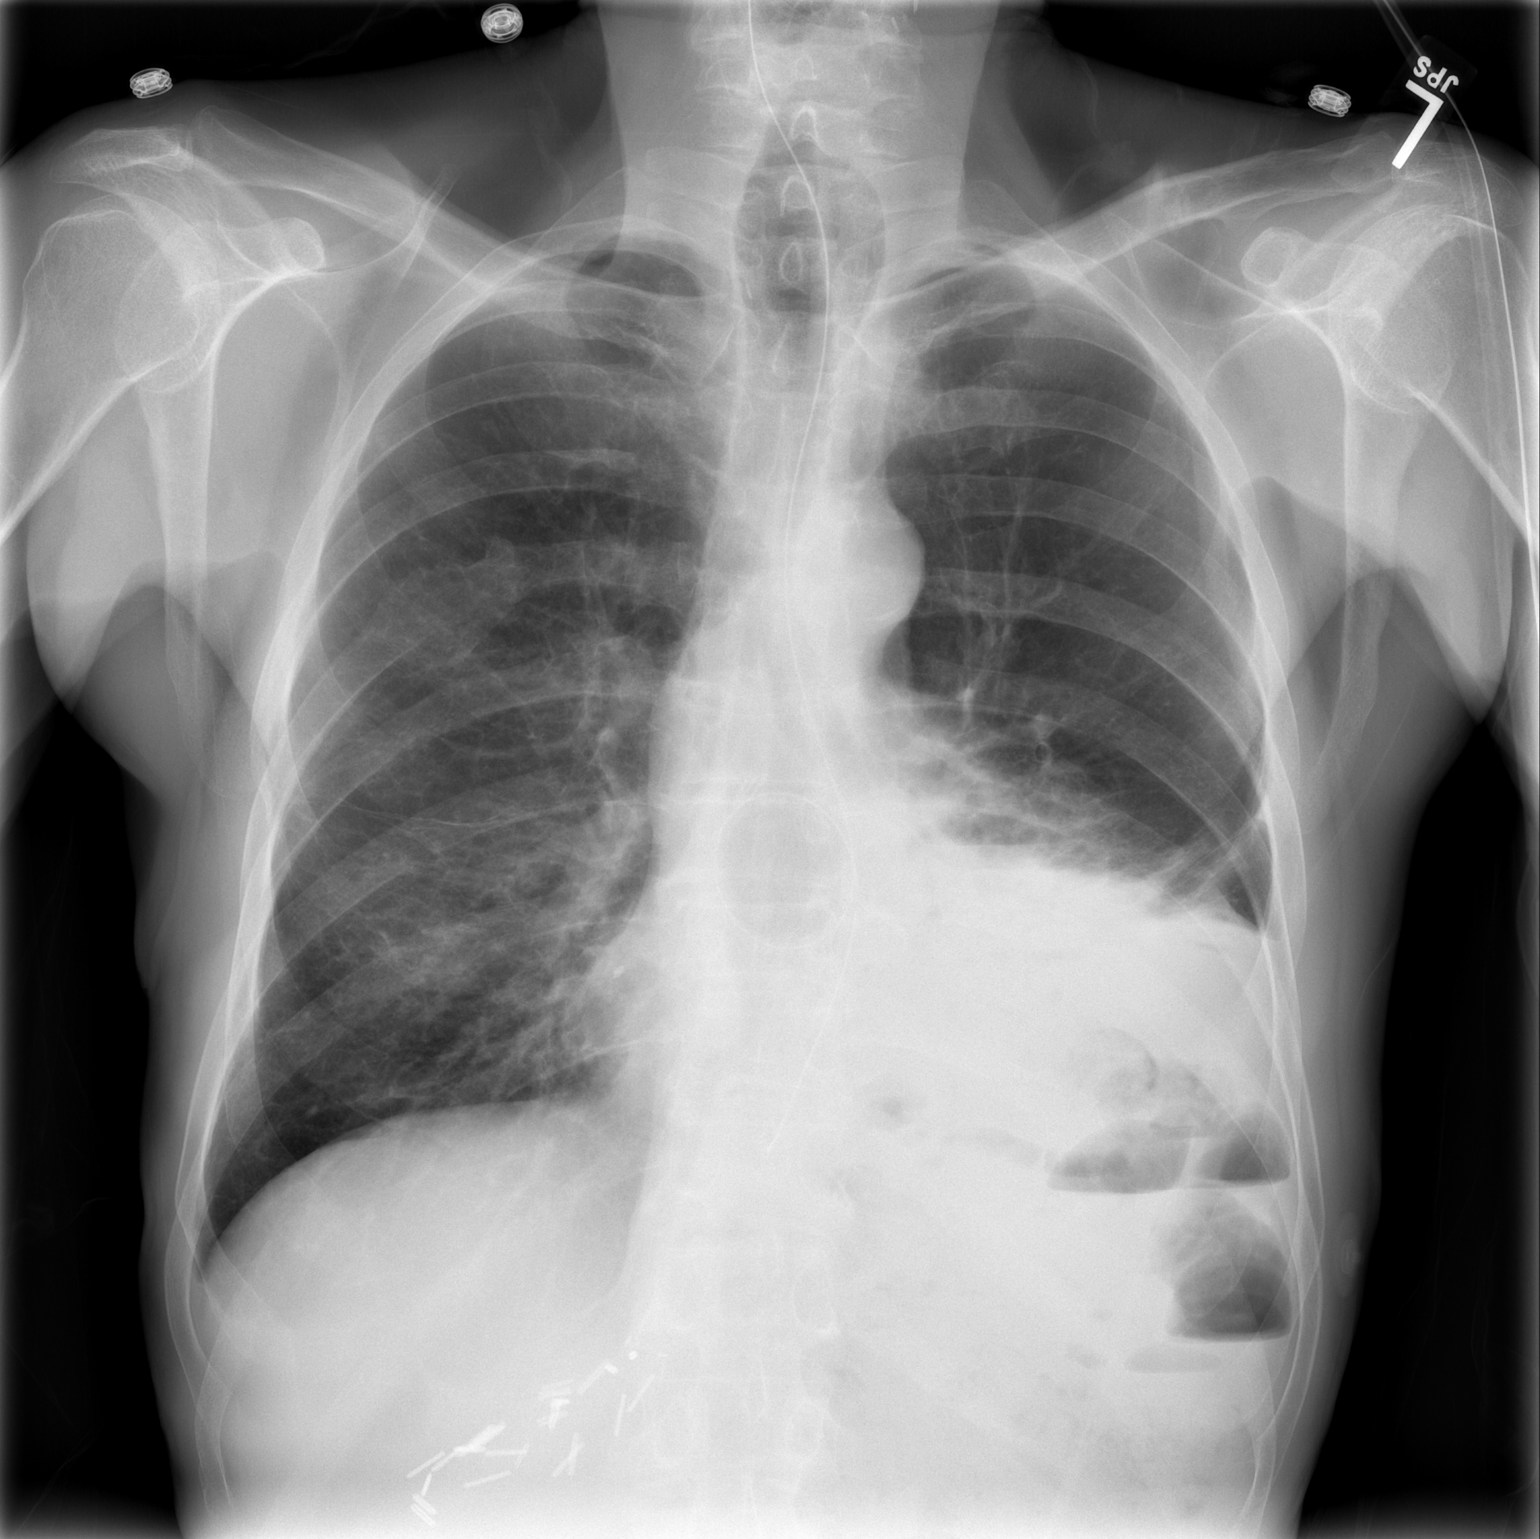

[w chest lat]
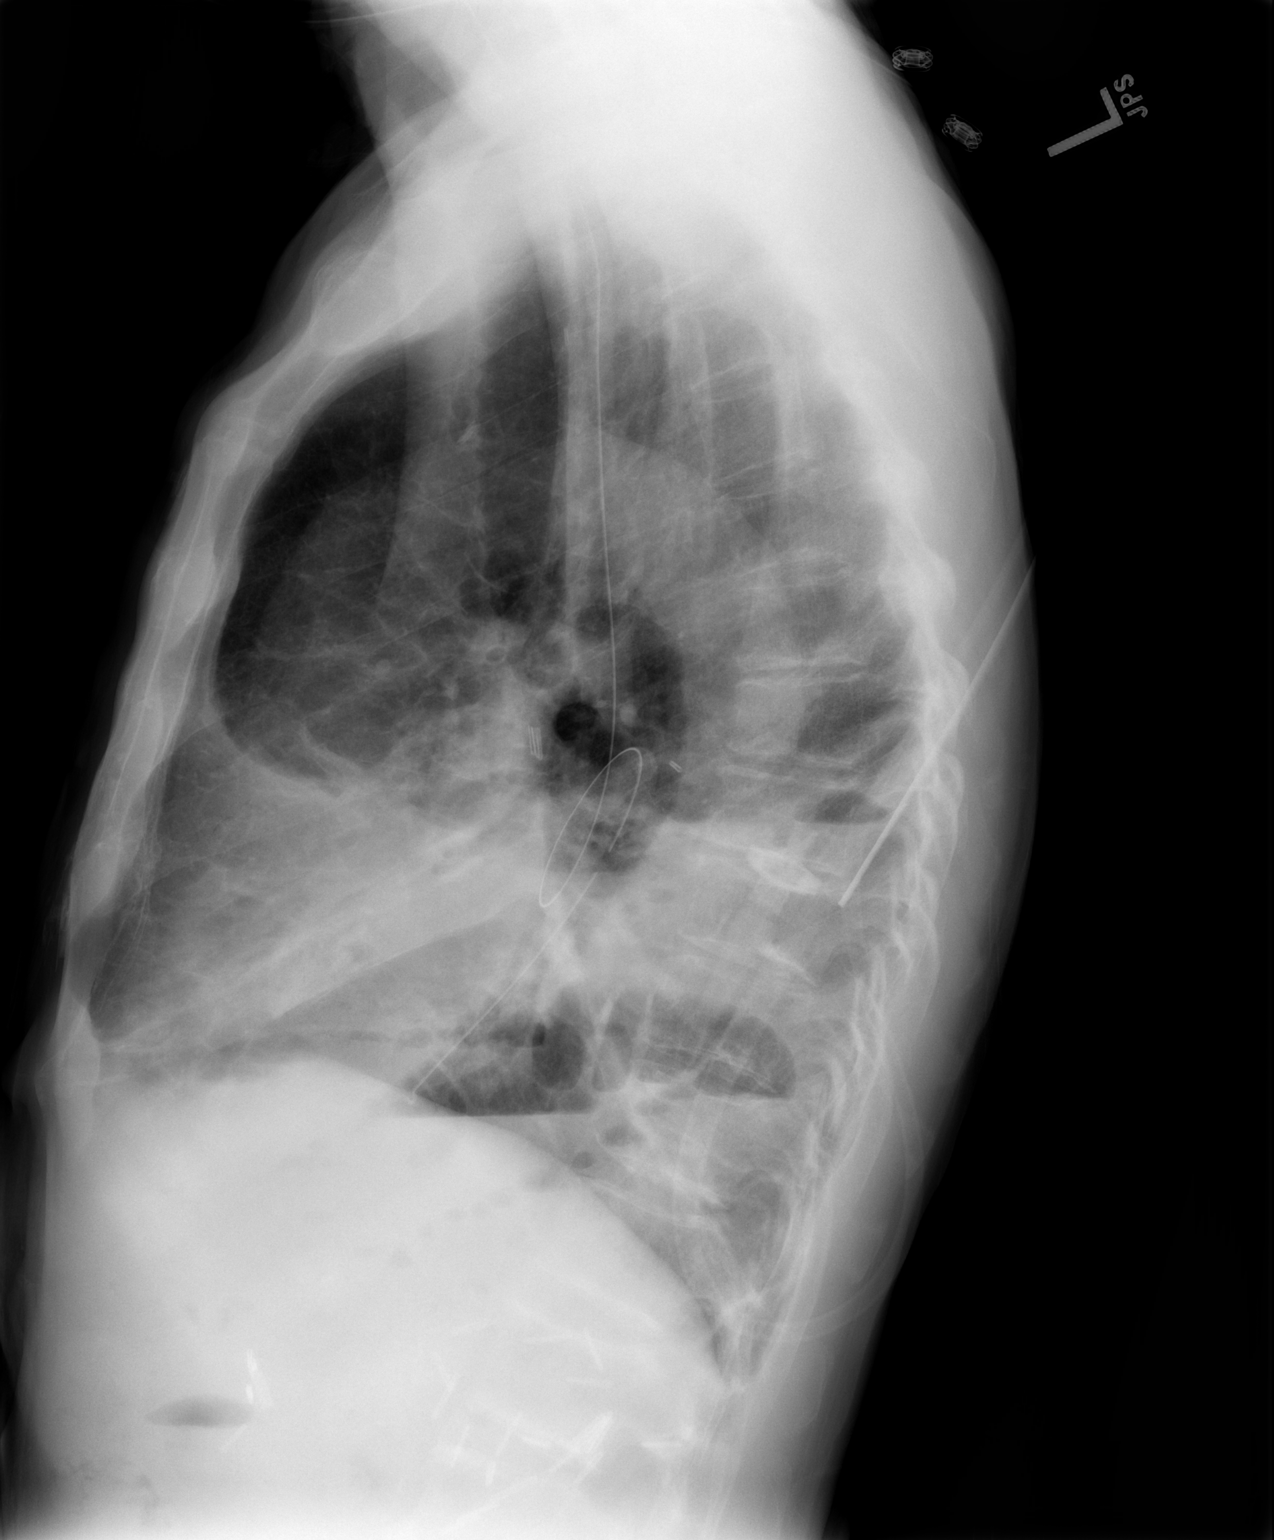

[2 of 2 positions shown; findings below may reference images not displayed]

FINDINGS: Prior CT from 06/09/2009 demonstrate the patient is
status post gastric pull-through.  That exam also demonstrated left
side diaphragmatic hernia, which is also confirmed on recent CT
from 10/13/2009.  Interval increased dense opacity at the left lung
base, favor pulmonary consolidation superimposed on preexisting
hernia.  Probable small pleural effusion.  Nasogastric type tube is
in placed and its tip is located at the level of the diaphragm.  No
pneumothorax or pneumoperitoneum.  There are air-fluid levels and
small bowel loops projecting over the left diaphragm compatible
with known obstruction.  Right lung is clear. Stable visualized
osseous structures.
IMPRESSION: 1.  Evidence of small bowel obstruction in the region of the left
diaphragmatic hernia.
2.  Worsening airspace disease and small effusion at the left lung
base could be reactive (due to bowel obstruction) or reflect
pneumonia.
3.  Nasogastric tube placed, tip at the level of the diaphragm.
The patient is status post gastric pull-through.

## 2010-01-02 IMAGING — CR DG ABDOMEN ACUTE W/ 1V CHEST
3 series · 3 of 3 positions shown · non-contrast
Comparison: 10/14/2009 and CT 10/13/2009

CLINICAL DATA: Small bowel obstruction.

ACUTE ABDOMEN SERIES (ABDOMEN 2 VIEW & CHEST 1 VIEW)

[w chest pa]
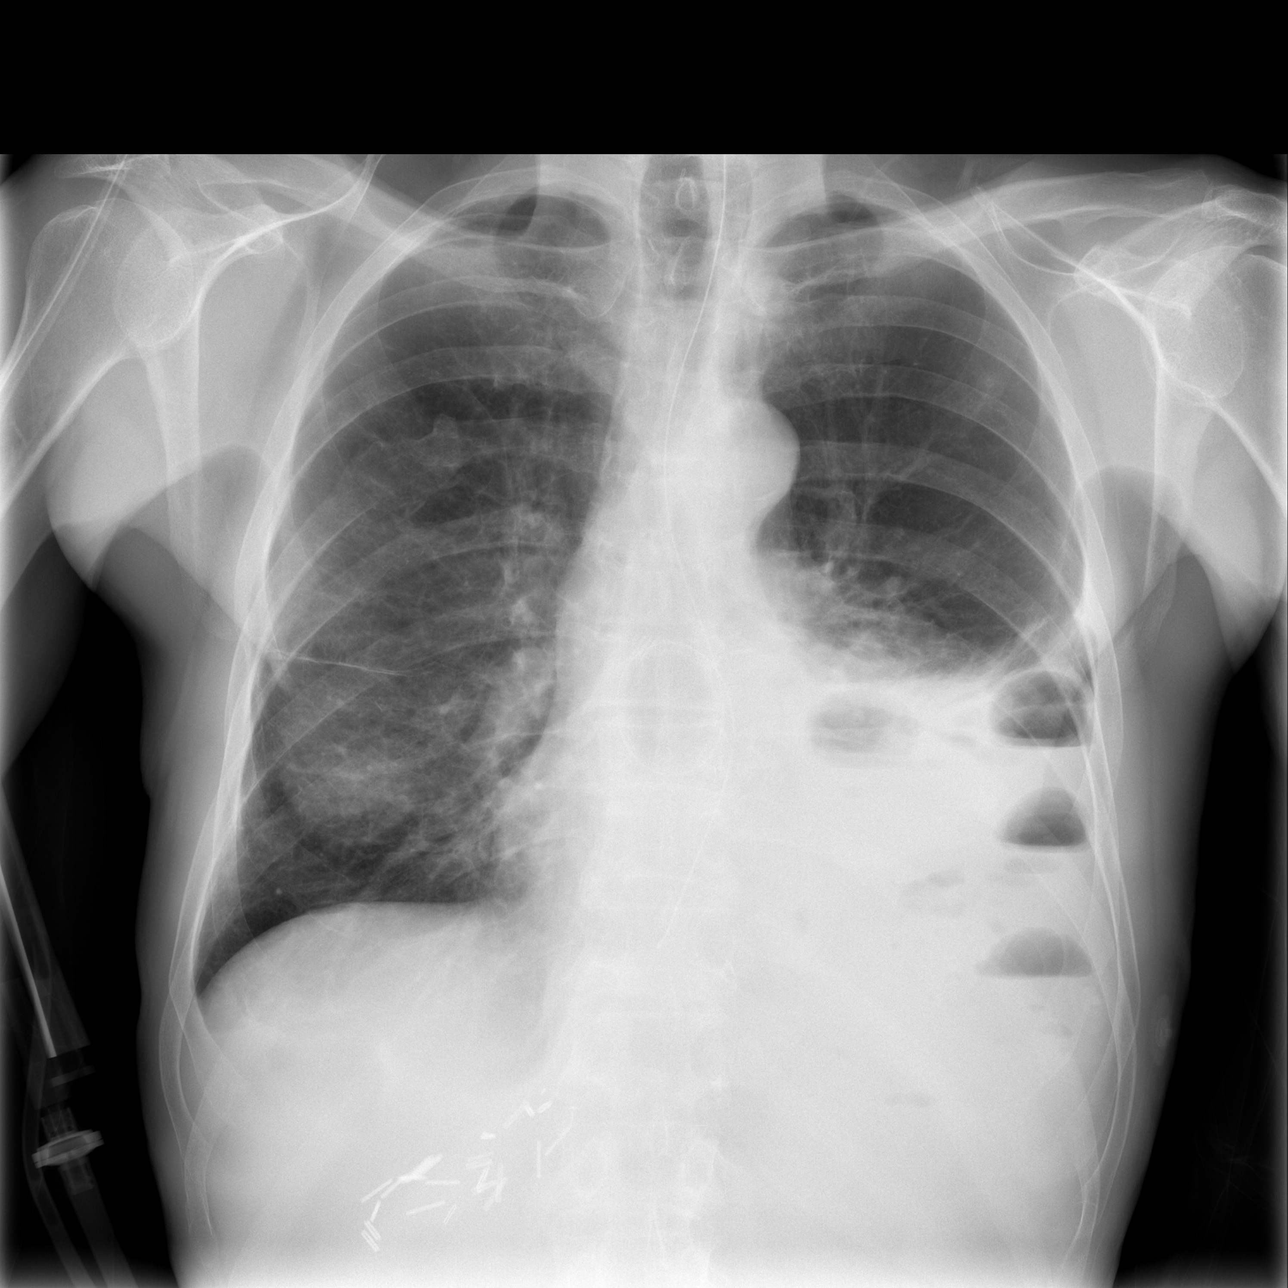

[w abdomen upright]
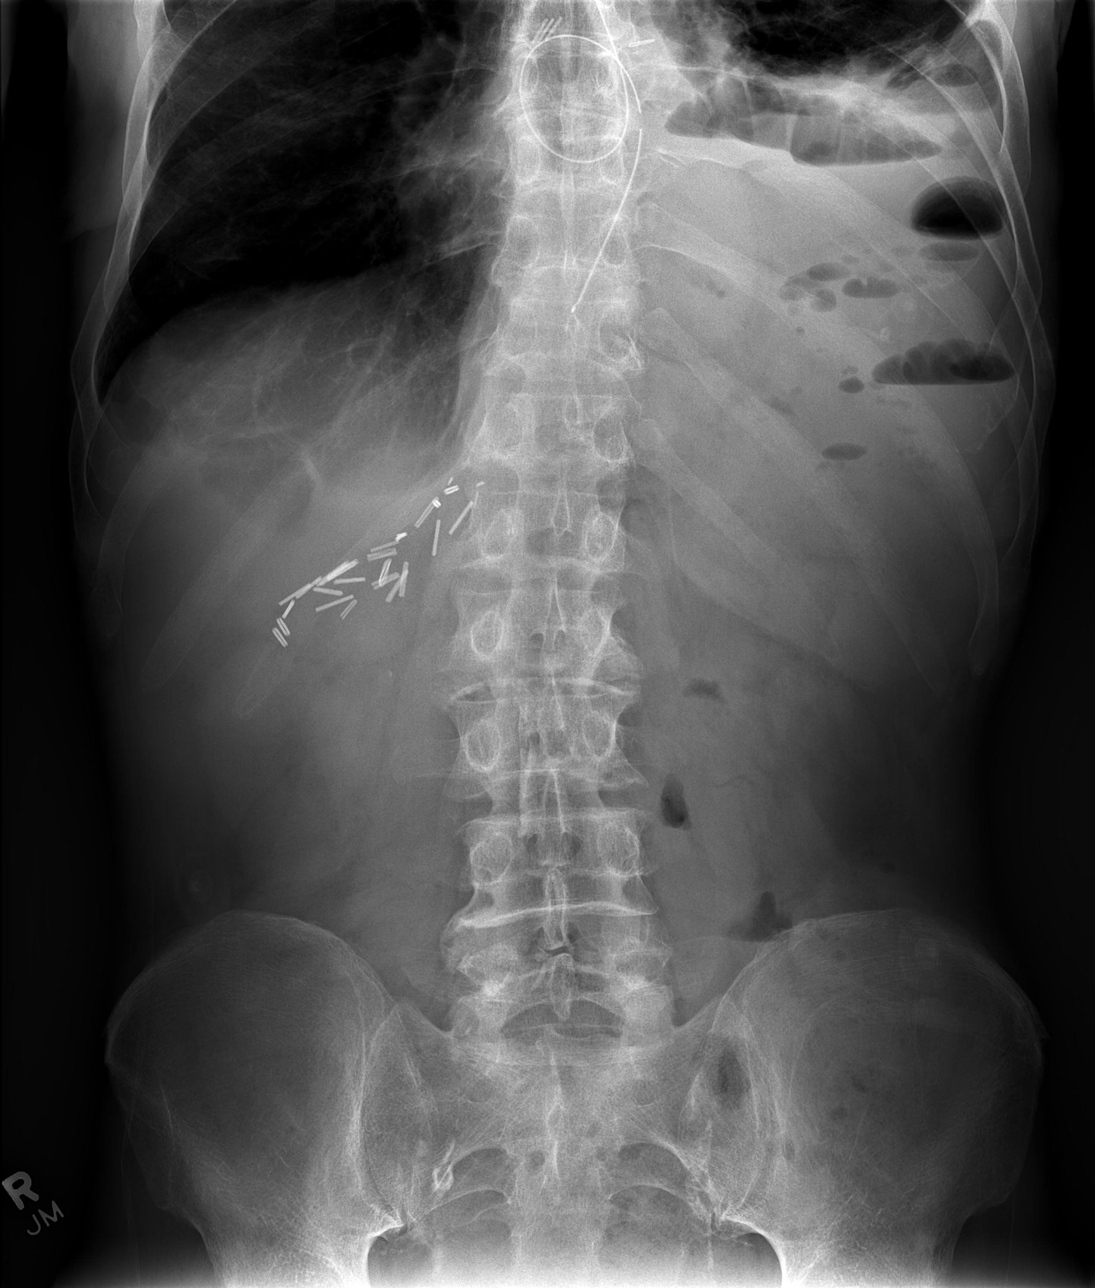

[t abdomen supine]
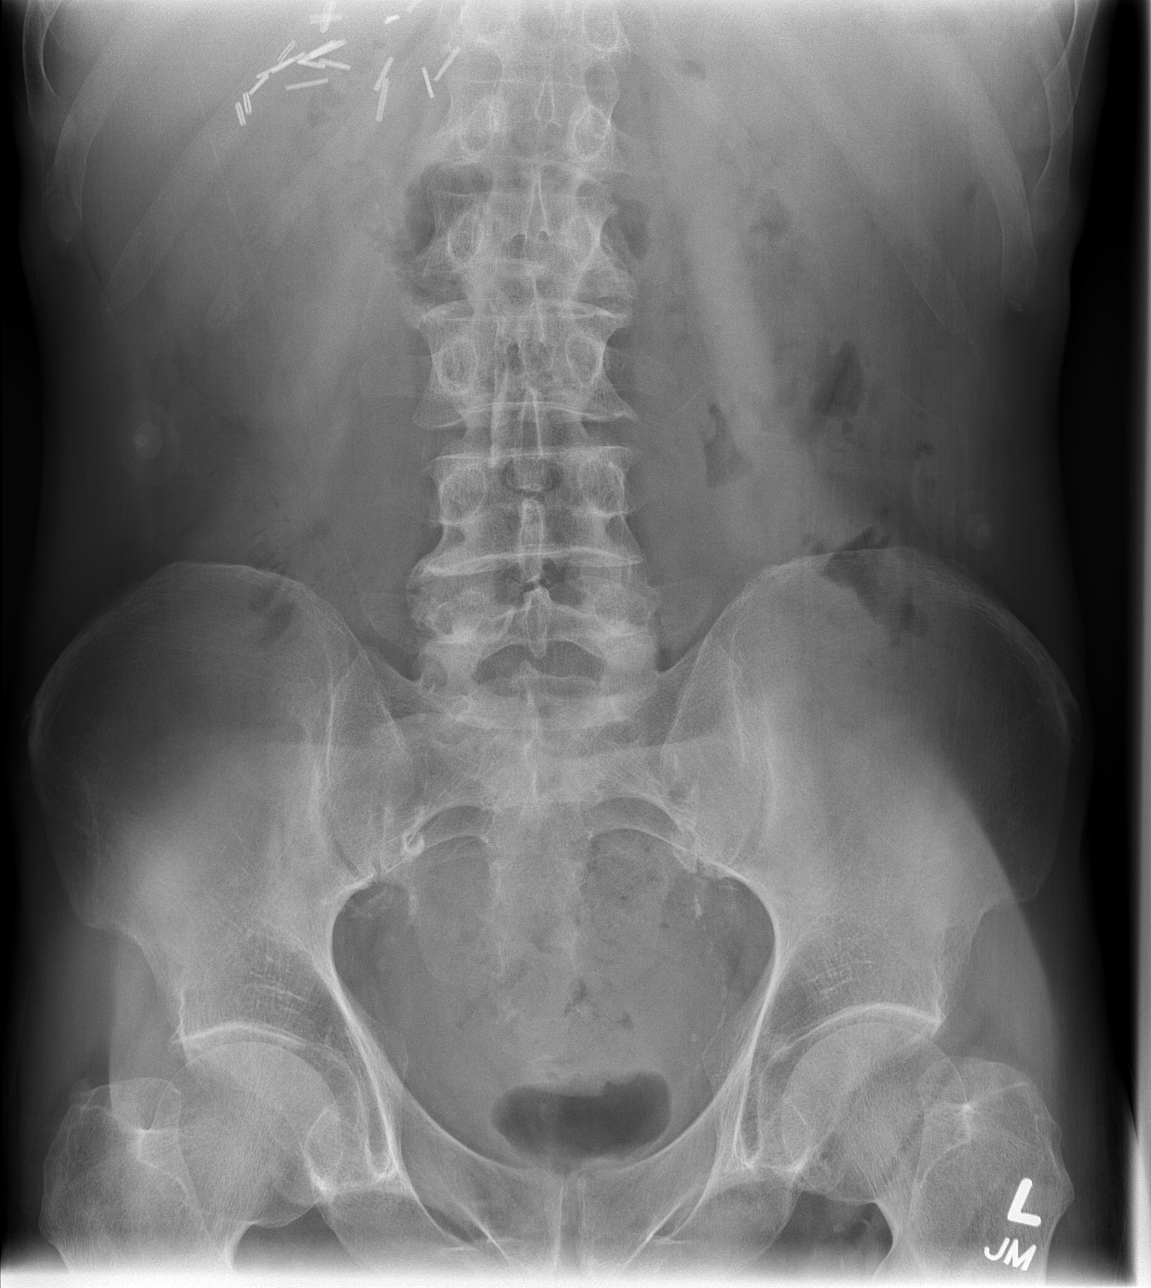

[3 of 3 positions shown; findings below may reference images not displayed]

FINDINGS: The patient status post gastric pull-through for
esophageal cancer.  Nasogastric tube is in place, coiled in the
distal esophagus/gastric pull-through, tip at the level of the
diaphragm.  There is left diaphragmatic hernia, associated with
left base opacity.  Left base opacity appears stable.  Deformity of
the right posterior sixth rib identified.

There is no evidence for free intraperitoneal air beneath the
diaphragm.  Supine and erect views of the abdomen shows surgical
clips within the right upper quadrant.  There is persistent
dilatation of small bowel loops, best seen within the left upper
quadrant.  Small amount of rectal gas is identified.
IMPRESSION: 1.  Left lower lobe atelectasis.
2.  Persistent small bowel dilatation.
3.  Nasogastric tube coiled; tip at diaphragm.

## 2010-01-03 IMAGING — CR DG ABDOMEN ACUTE W/ 1V CHEST
3 series · 3 of 3 positions shown · non-contrast
Comparison: 10/15/2009

CLINICAL DATA: S B O.  The patient does not feel better.  Evaluate
nasogastric tube.

ACUTE ABDOMEN SERIES (ABDOMEN 2 VIEW & CHEST 1 VIEW)

[w chest pa]
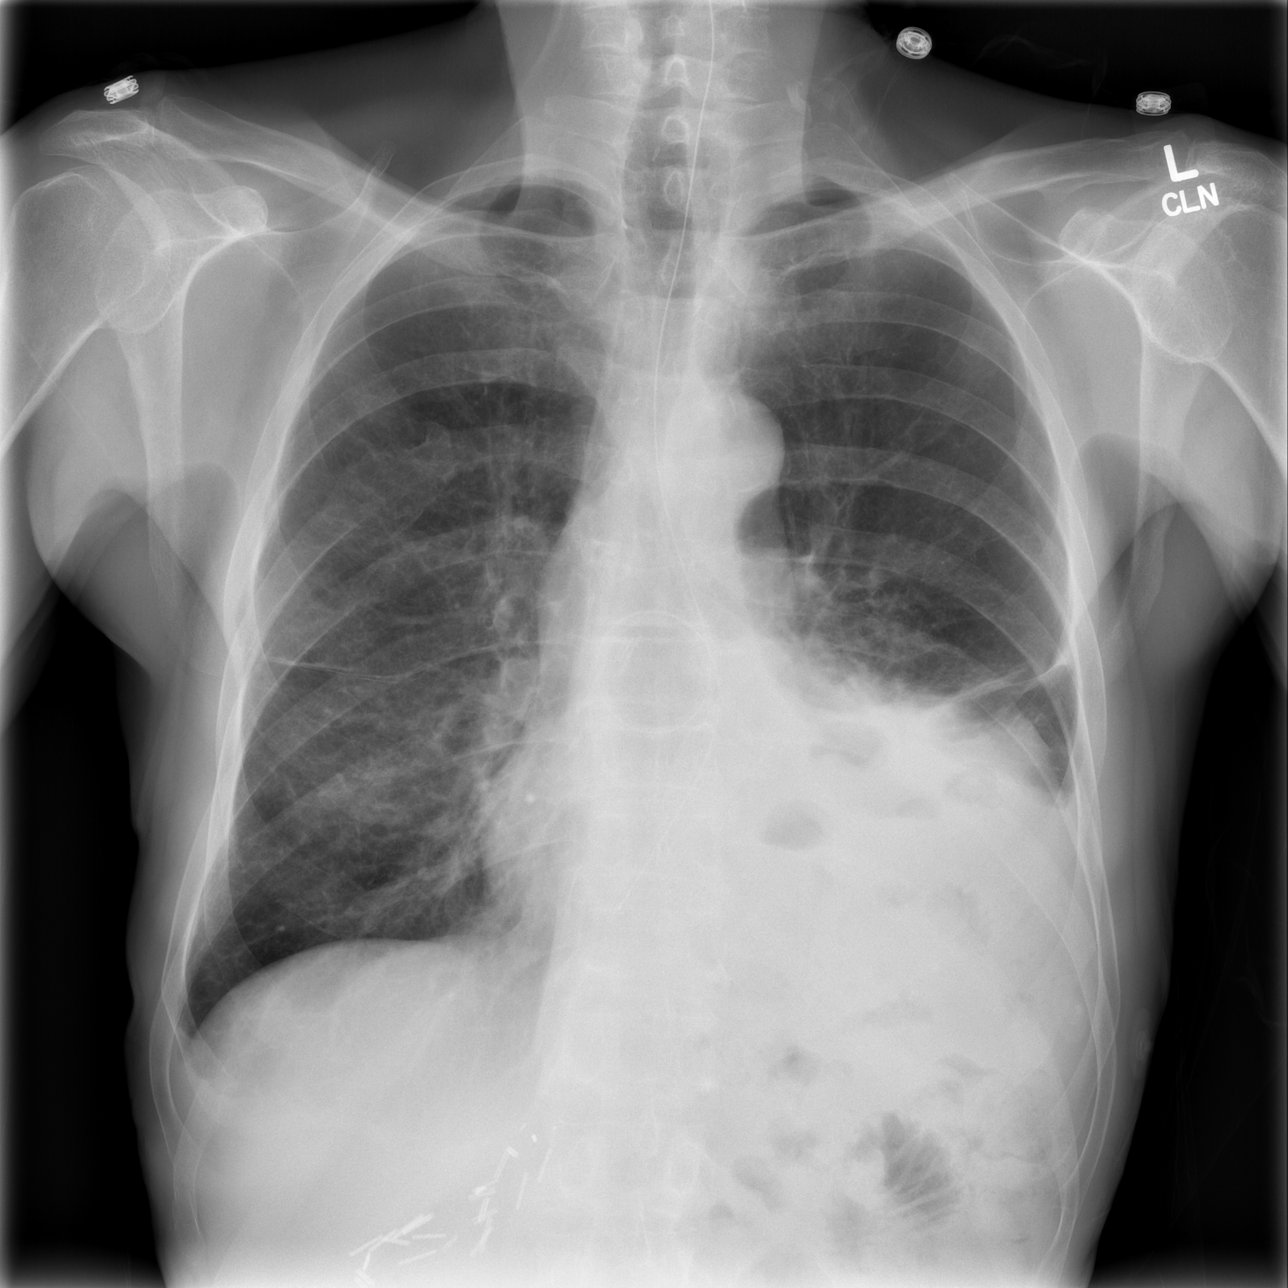

[w abdomen upright]
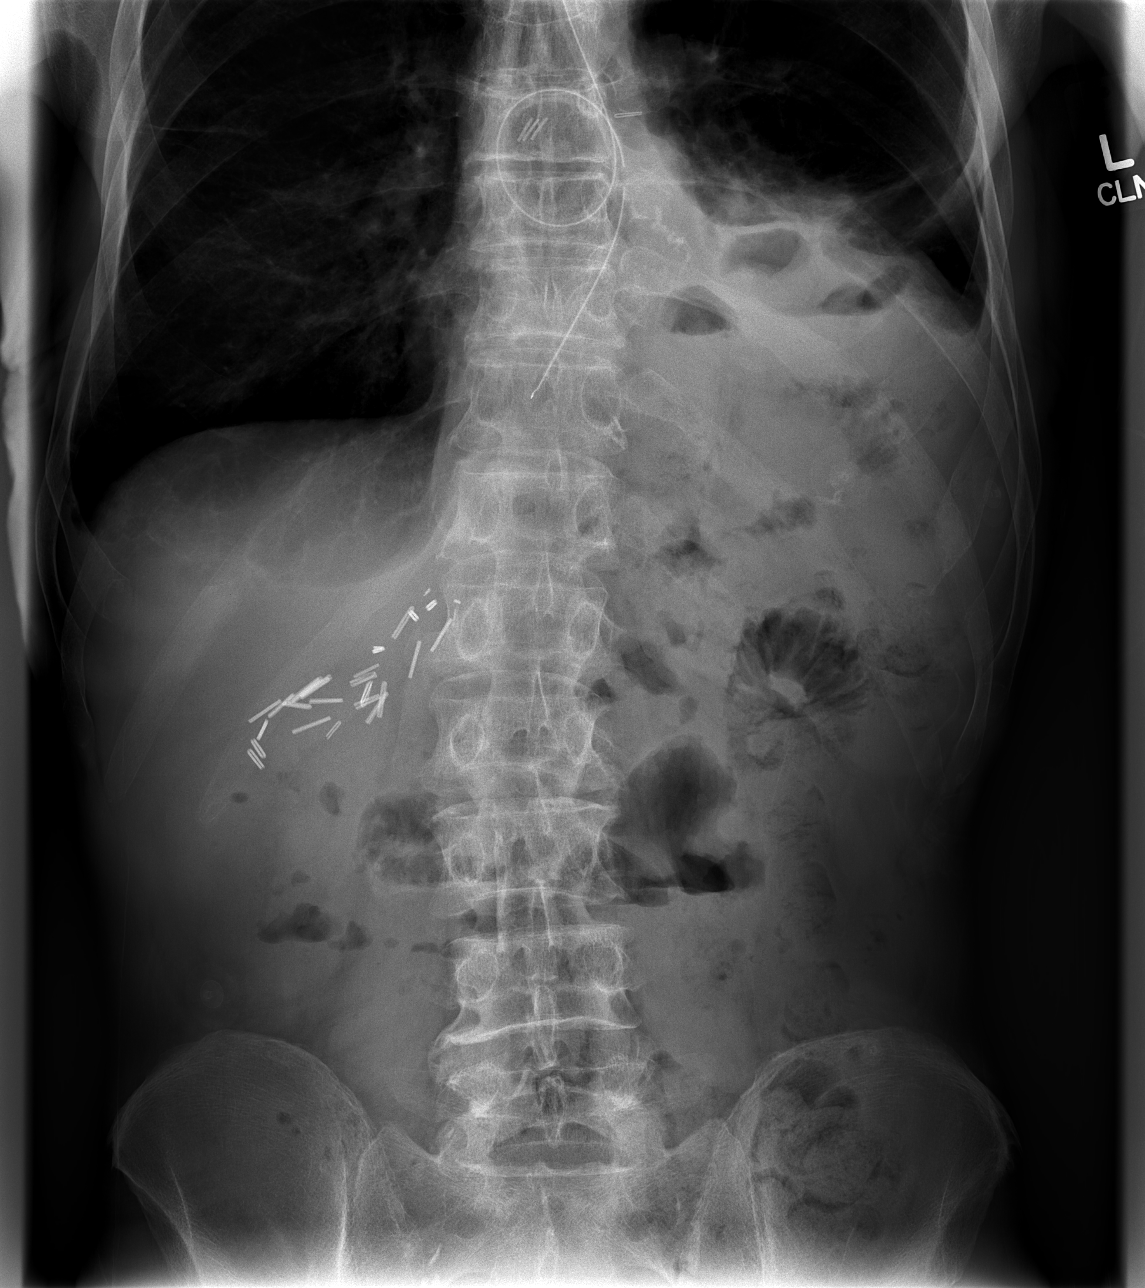

[t abdomen supine]
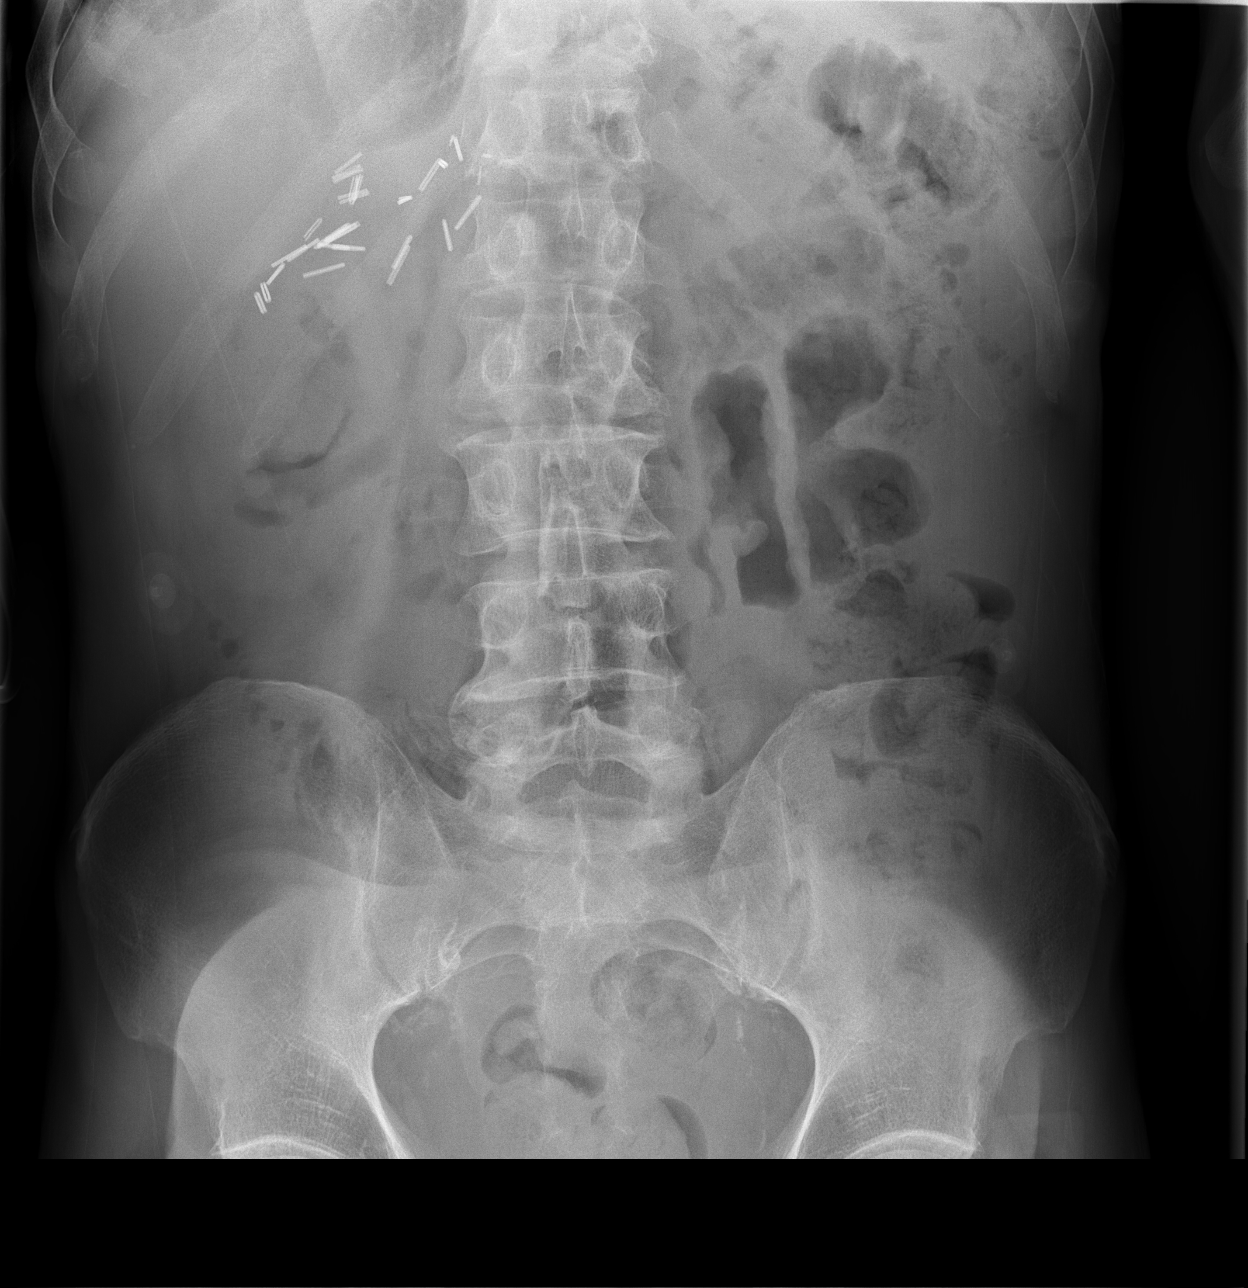

[3 of 3 positions shown; findings below may reference images not displayed]

FINDINGS: Patient status post gastric pull-through for esophageal
cancer.  Left diaphragmatic hernia.  As noted on the prior study,
there is a coil of the nasogastric tube within the lower esophagus
- gastric pull-through region.  Tip remains at the level of the
diaphragm.  Left lower lobe opacity is consistent with
diaphragmatic hernia.  There is minimal right lower lobe
atelectasis.  Left lower lobe atelectasis and effusion are also
again noted.

Supine and erect views show a few or air fluid levels than on the
previous exam.  There is gas and stool within nondilated loops of
colon.  Overall, small bowel loops appear less dilated.  Surgical
clips are identified in the right upper quadrant.
IMPRESSION: 1.  No significant change in the appearance of nasogastric tube.
2.  Small bowel loops appear less dilated, containing fewer air-
fluid levels.  There is moderate stool within colonic loops.

## 2010-01-06 IMAGING — CR DG ABDOMEN ACUTE W/ 1V CHEST
3 series · 3 of 3 positions shown · non-contrast
Comparison: 10/16/2009

CLINICAL DATA: Follow-up small bowel obstruction.

ACUTE ABDOMEN SERIES (ABDOMEN 2 VIEW & CHEST 1 VIEW)

[w chest pa]
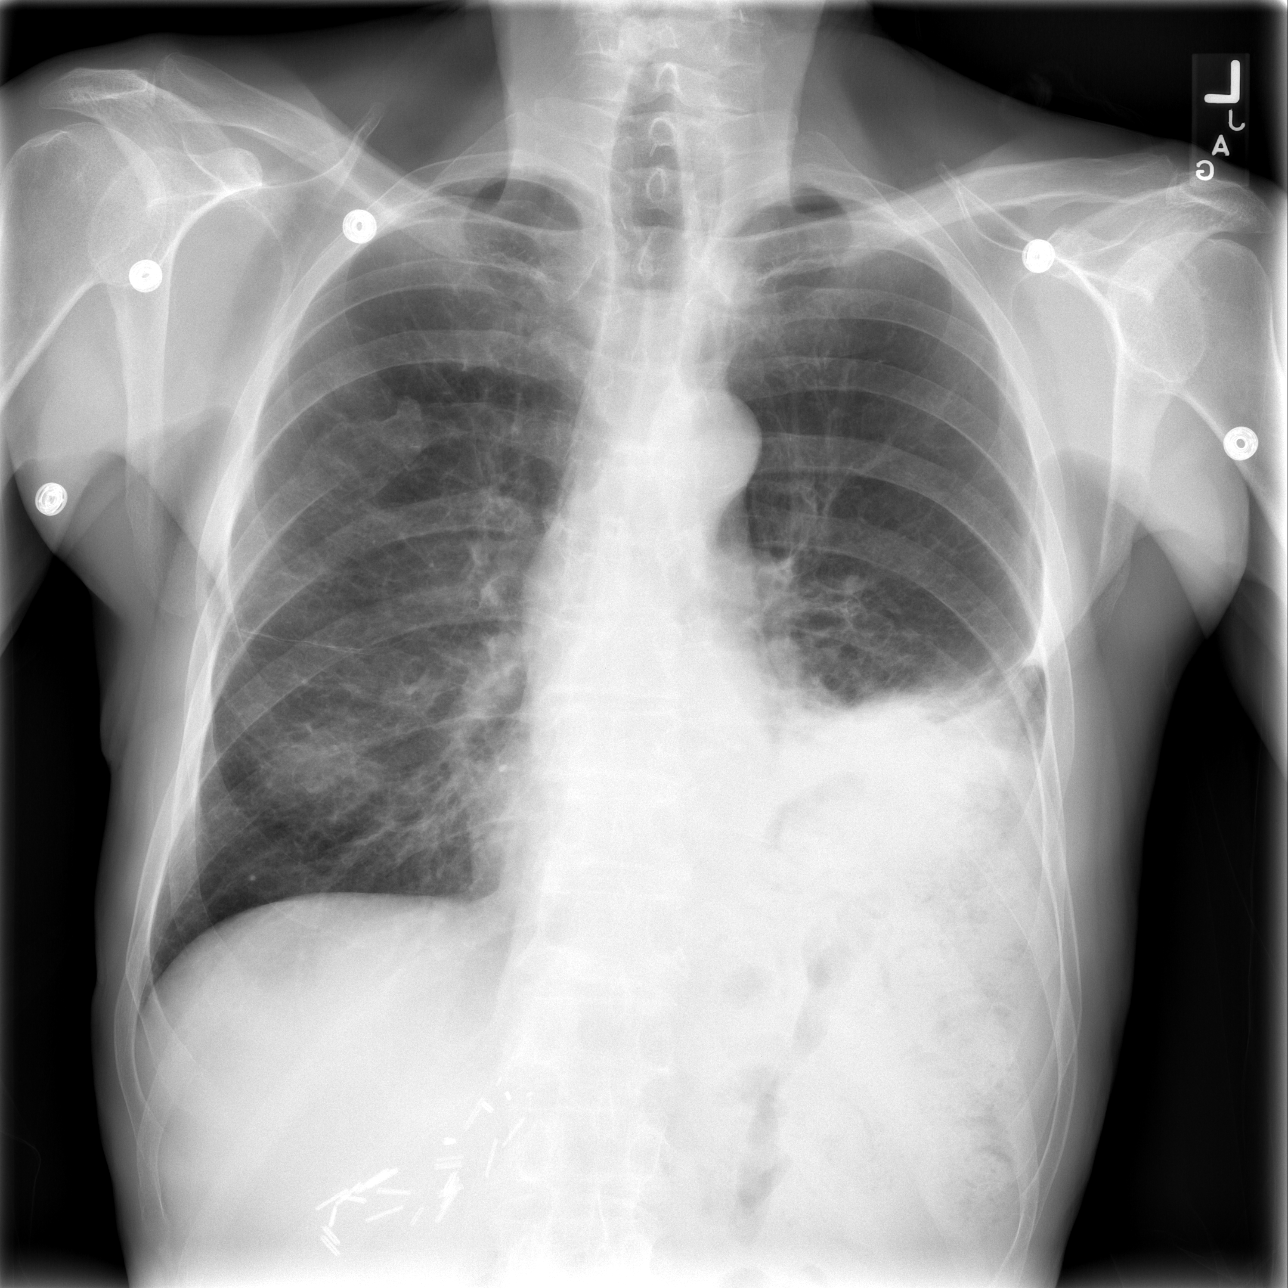

[w abdomen upright]
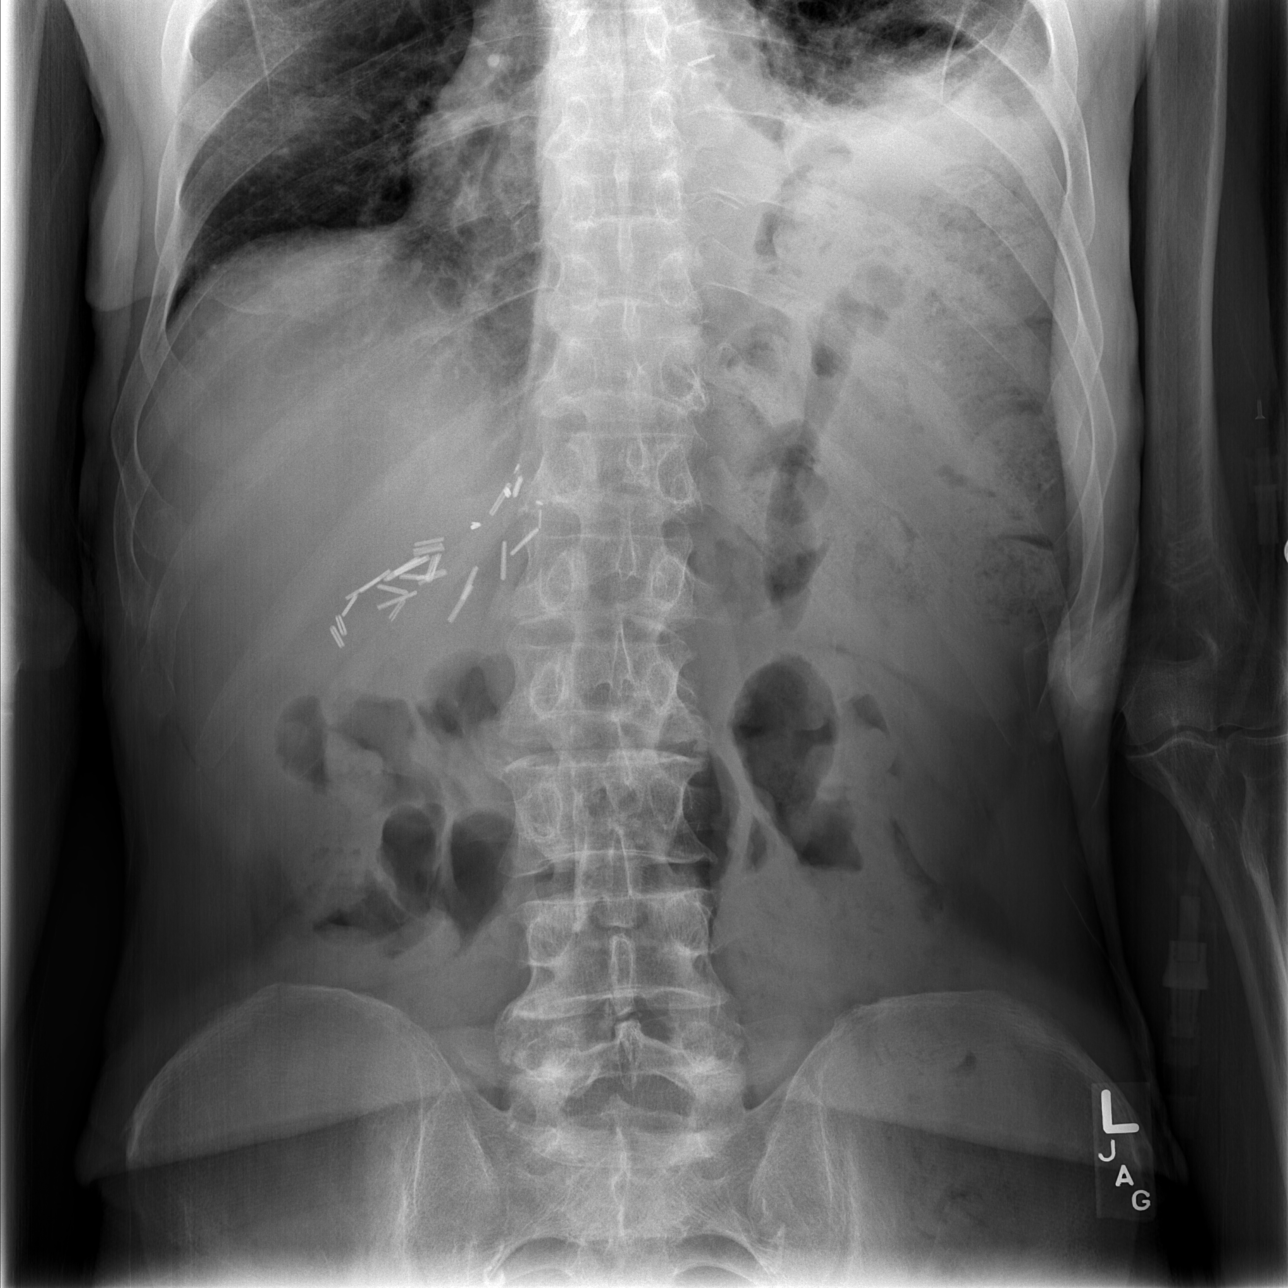

[t abdomen supine]
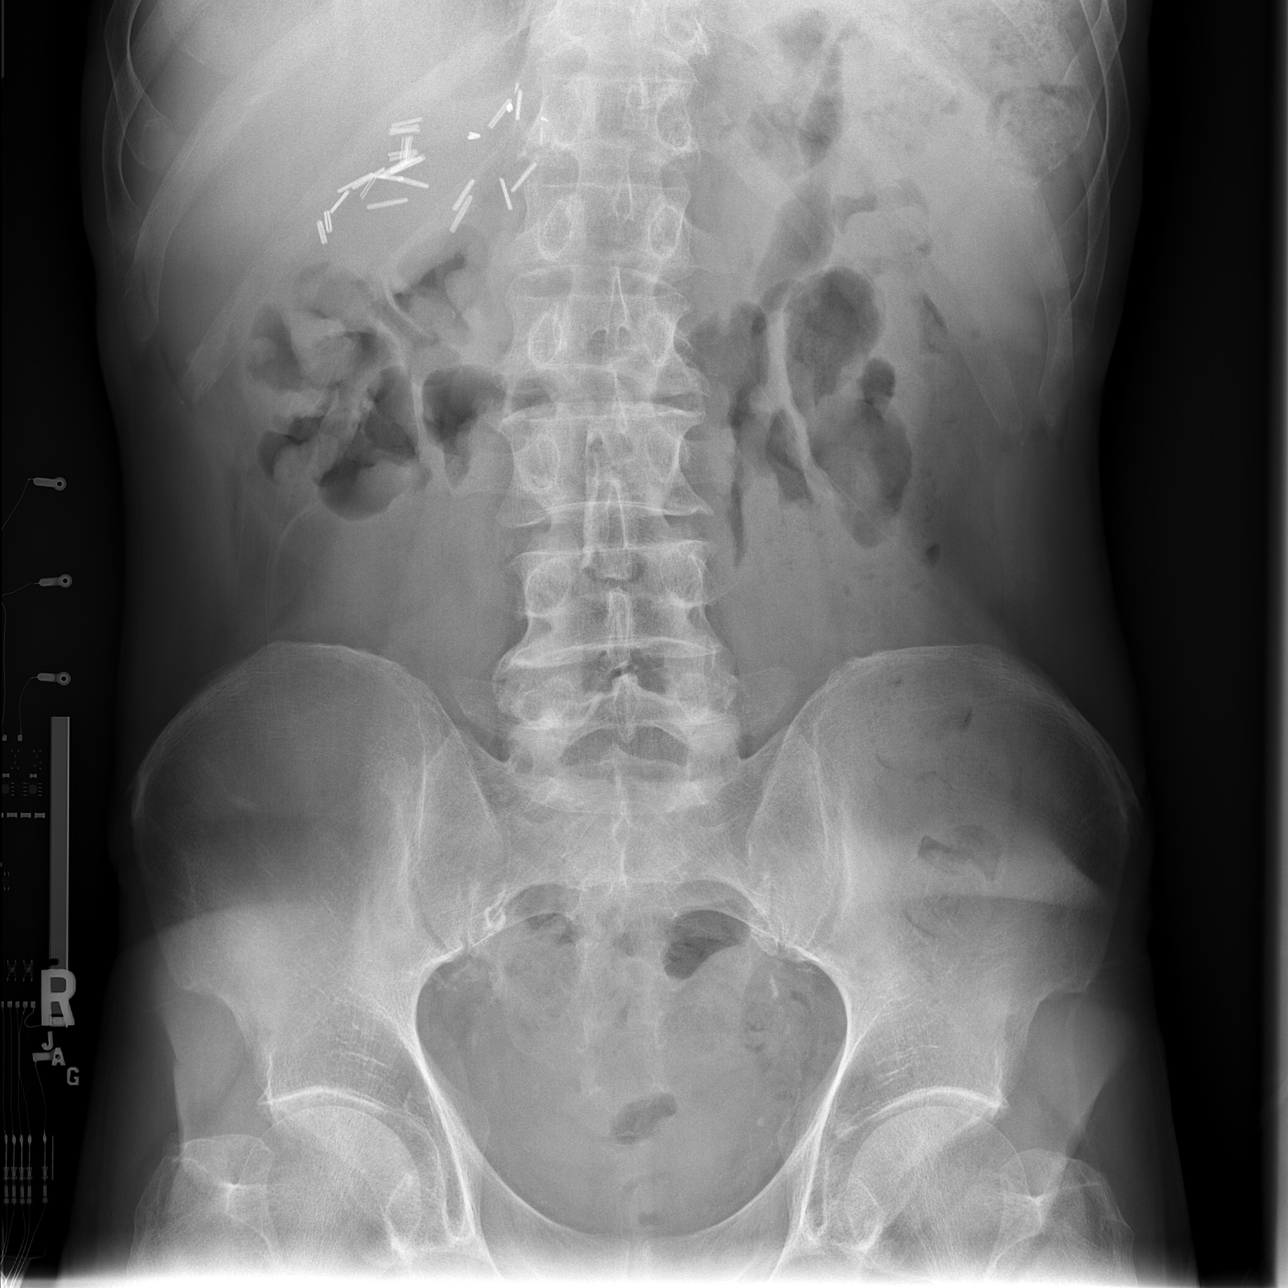

[3 of 3 positions shown; findings below may reference images not displayed]

FINDINGS: The upright chest x-ray demonstrates a persistent left
lower lobe process with diaphragmatic hernia and atelectasis.
Patchy opacity in the right lung base is unchanged and may reflect
atelectasis

Two views of the abdomen demonstrate scattered loops of small bowel
with air but no distention or air fluid levels.  There is moderate
air and stool in the colon.  Right upper quadrant surgical changes
are again noted.  The soft tissue shadows of the abdomen
maintained.  Bony structures are stable.
IMPRESSION: 1.  Stable appearance of the heart lungs.
2.  No plain film findings for persistent small bowel obstruction.

## 2010-01-08 IMAGING — CR DG ABDOMEN ACUTE W/ 1V CHEST
3 series · 3 of 3 positions shown · non-contrast
Comparison: Acute abdominal series 10/19/2009 and CT abdomen pelvis
10/13/2009.  Chest CT of 06/09/2009

CLINICAL DATA: Abdominal pain

ACUTE ABDOMEN SERIES (ABDOMEN 2 VIEW & CHEST 1 VIEW)

[w chest pa]
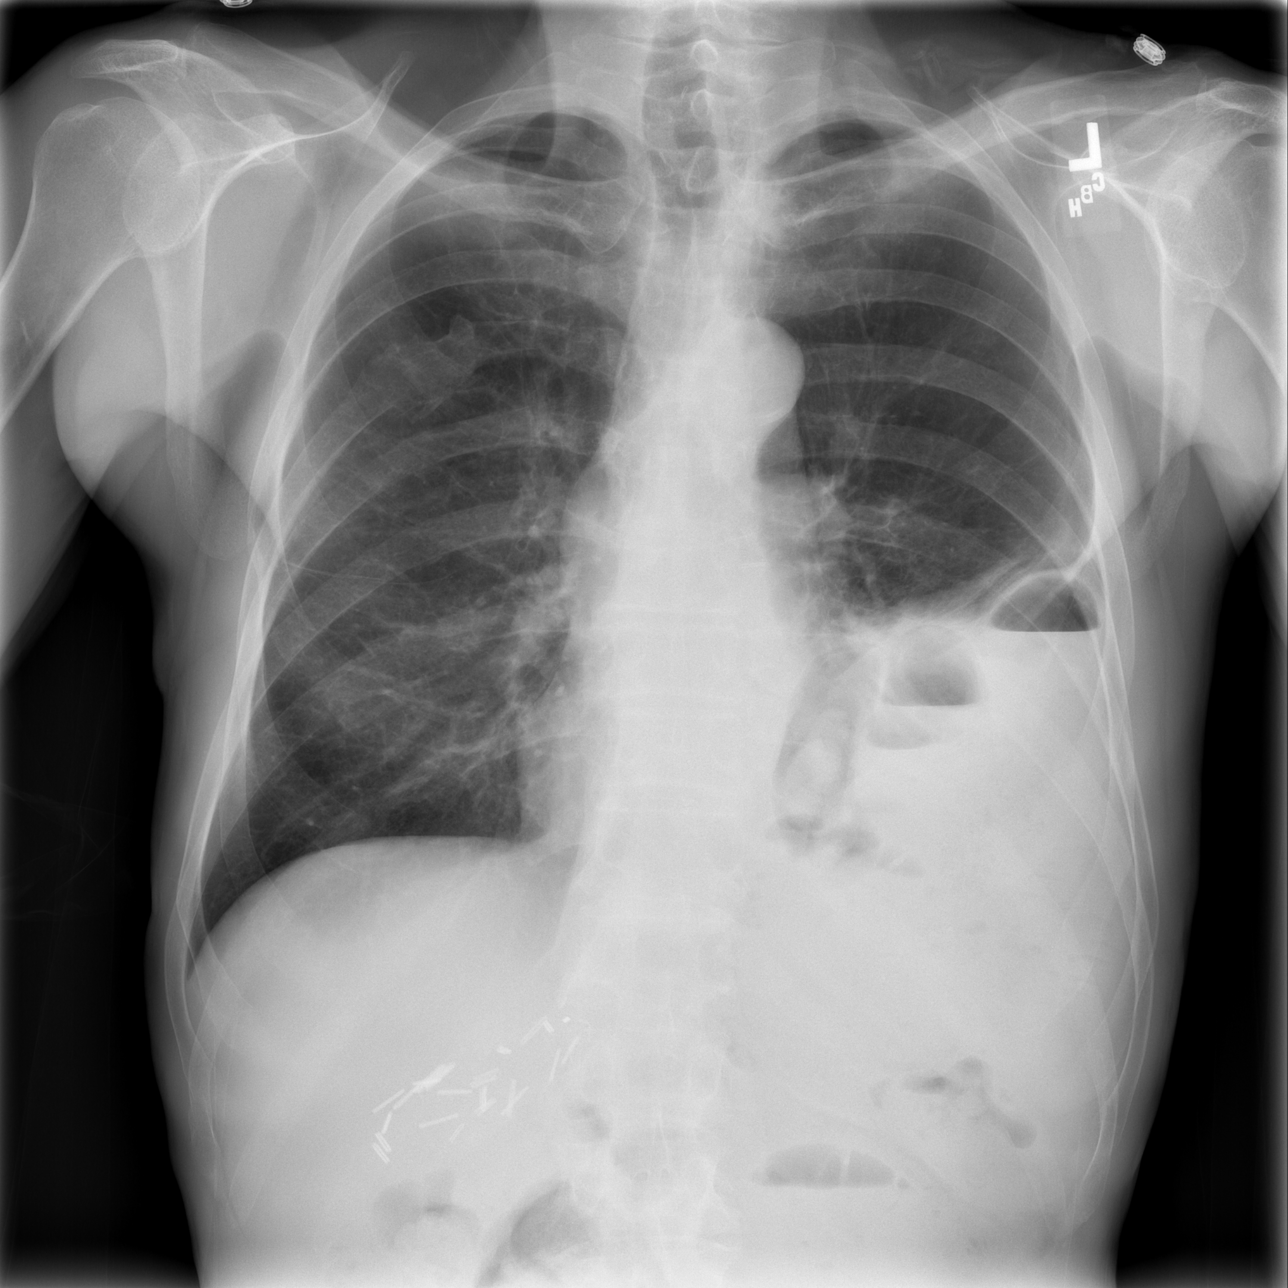

[w abdomen upright]
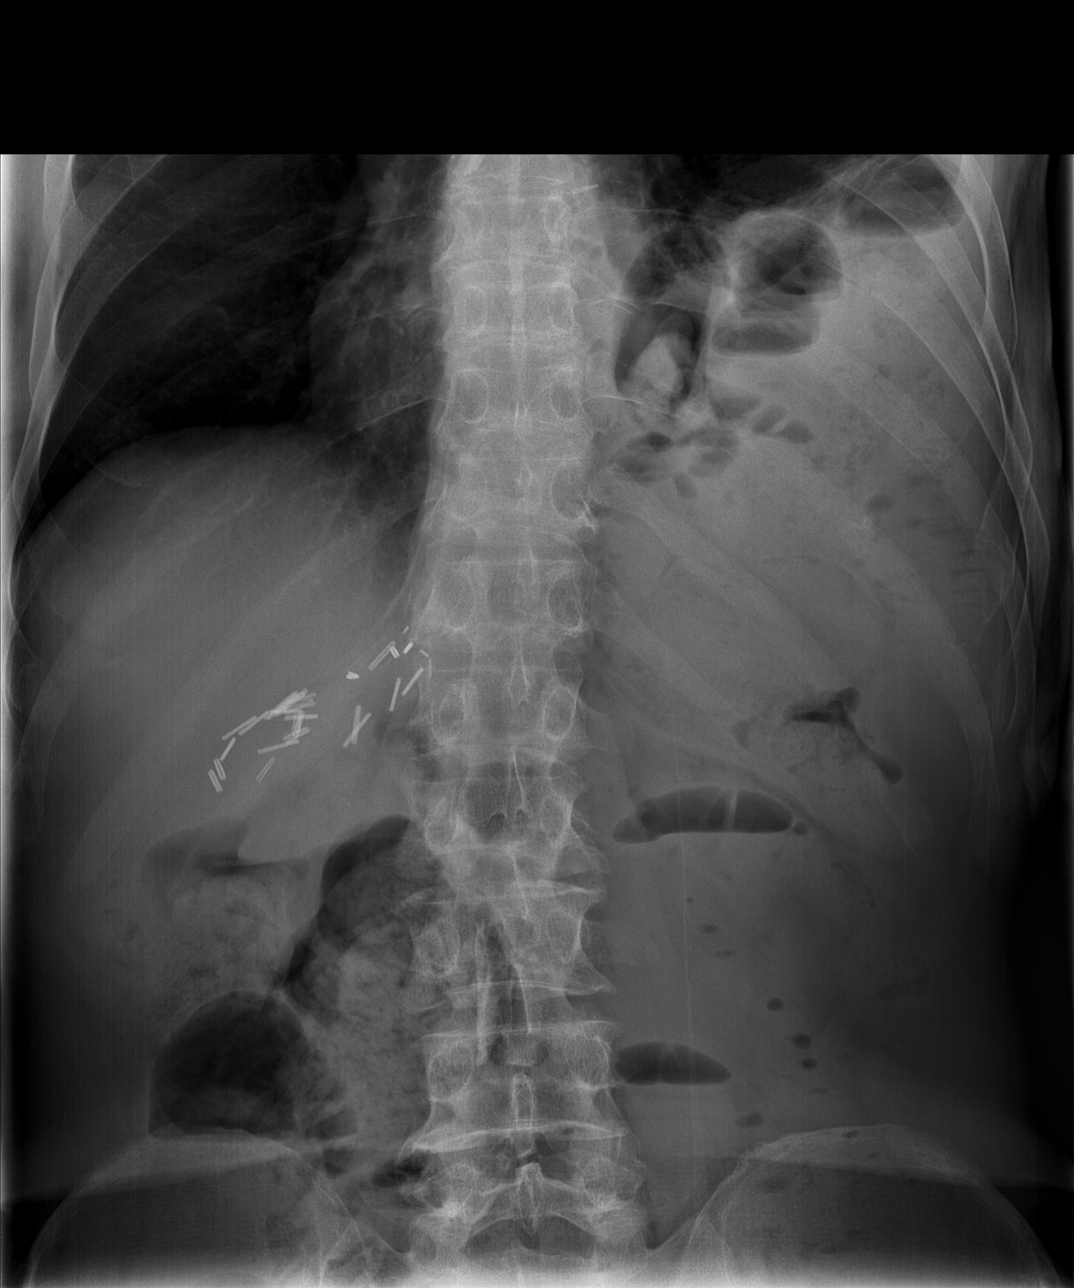

[t abdomen supine]
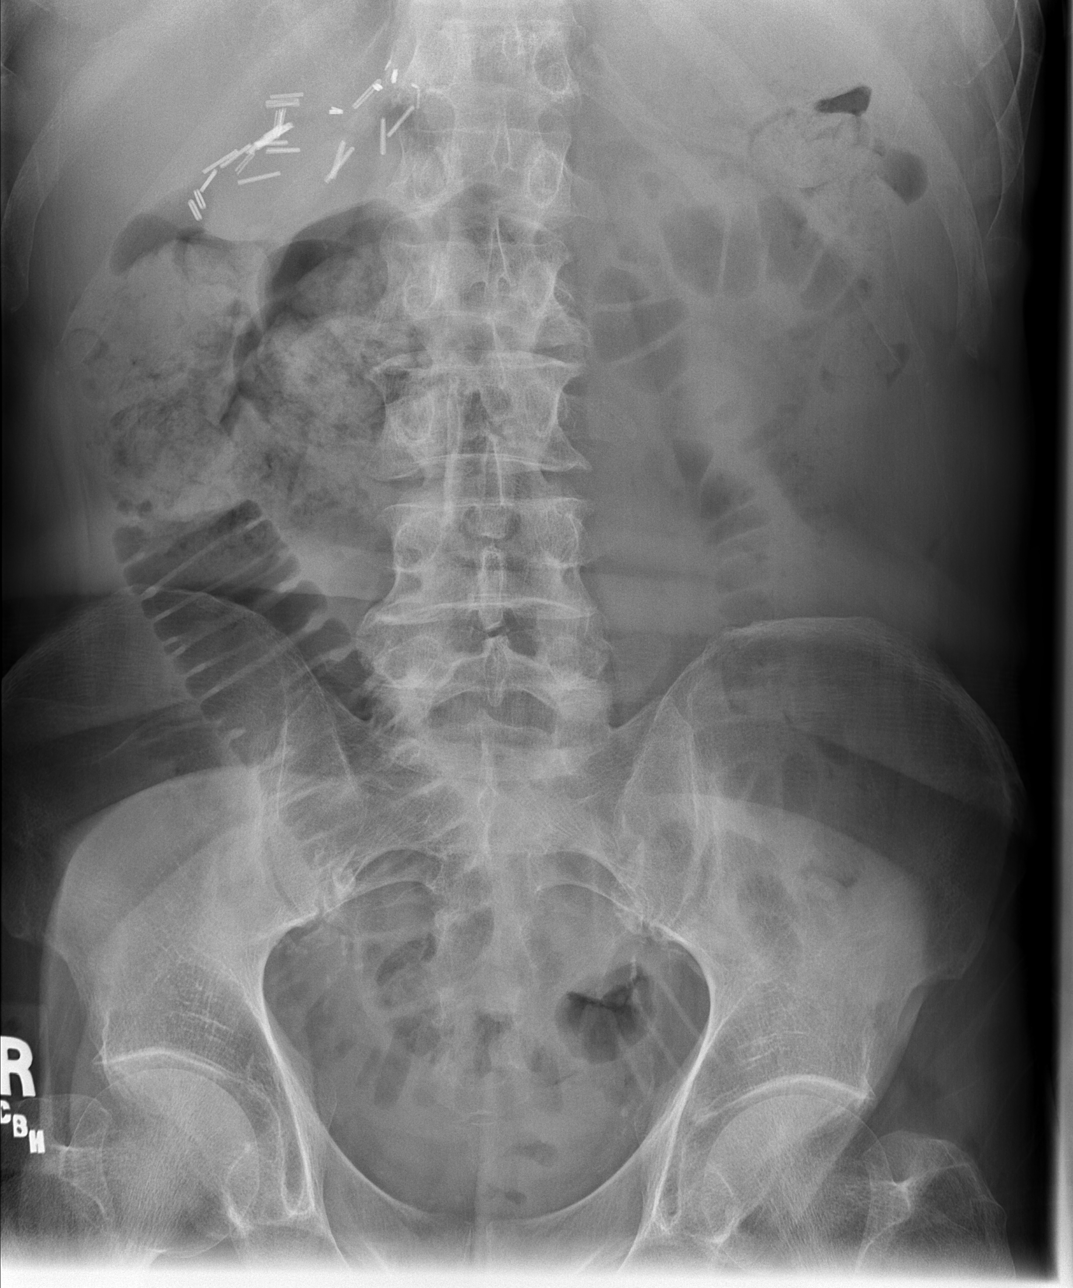

[3 of 3 positions shown; findings below may reference images not displayed]

FINDINGS: There are air-fluid levels within bowel loops, within the
left hemithorax, in this patient with a known left diaphragmatic
hernia.  There are emphysematous changes of the lungs and evidence
of remote right rib trauma or surgery.  The right lung is clear.
The left lung base is obscured by the herniated bowel loops.  The
left upper lung field is clear.

There are surgical clips in the right upper quadrant. There is
prominent stool in the colon from the hepatic flexure to the
transverse flexure.  No dilated loops of colon identified.  There
is small bowel dilatation in the abdomen and pelvis, with small
bowel loop measuring up to 5.5 cm in the right abdomen.  There are
air-fluid levels in the small bowel loops within the abdomen.  No
free intraperitoneal air is identified.
IMPRESSION: 1. Persistent versus recurrent small bowel obstruction in this
patient with known left diaphragmatic hernia, through which bowel
loops extend into the left hemithorax.  Overall, the pattern on the
acute abdominal series appears very similar compared to the scout
view of the recent CT of the abdomen pelvis of 10/13/2009.
[DATE].  Emphysema.

Critical test results telephoned to Dr. Tiger at the time of

## 2010-01-09 ENCOUNTER — Encounter: Admission: RE | Admit: 2010-01-09 | Discharge: 2010-01-09 | Payer: Self-pay | Admitting: General Surgery

## 2010-01-09 IMAGING — CR DG ABDOMEN 2V
2 series · 2 of 2 positions shown · non-contrast
Comparison: 10/21/2009

CLINICAL DATA: Left diaphragmatic hernia.  Evaluate NG tube.

ABDOMEN - 2 VIEW

[w abdomen upright]
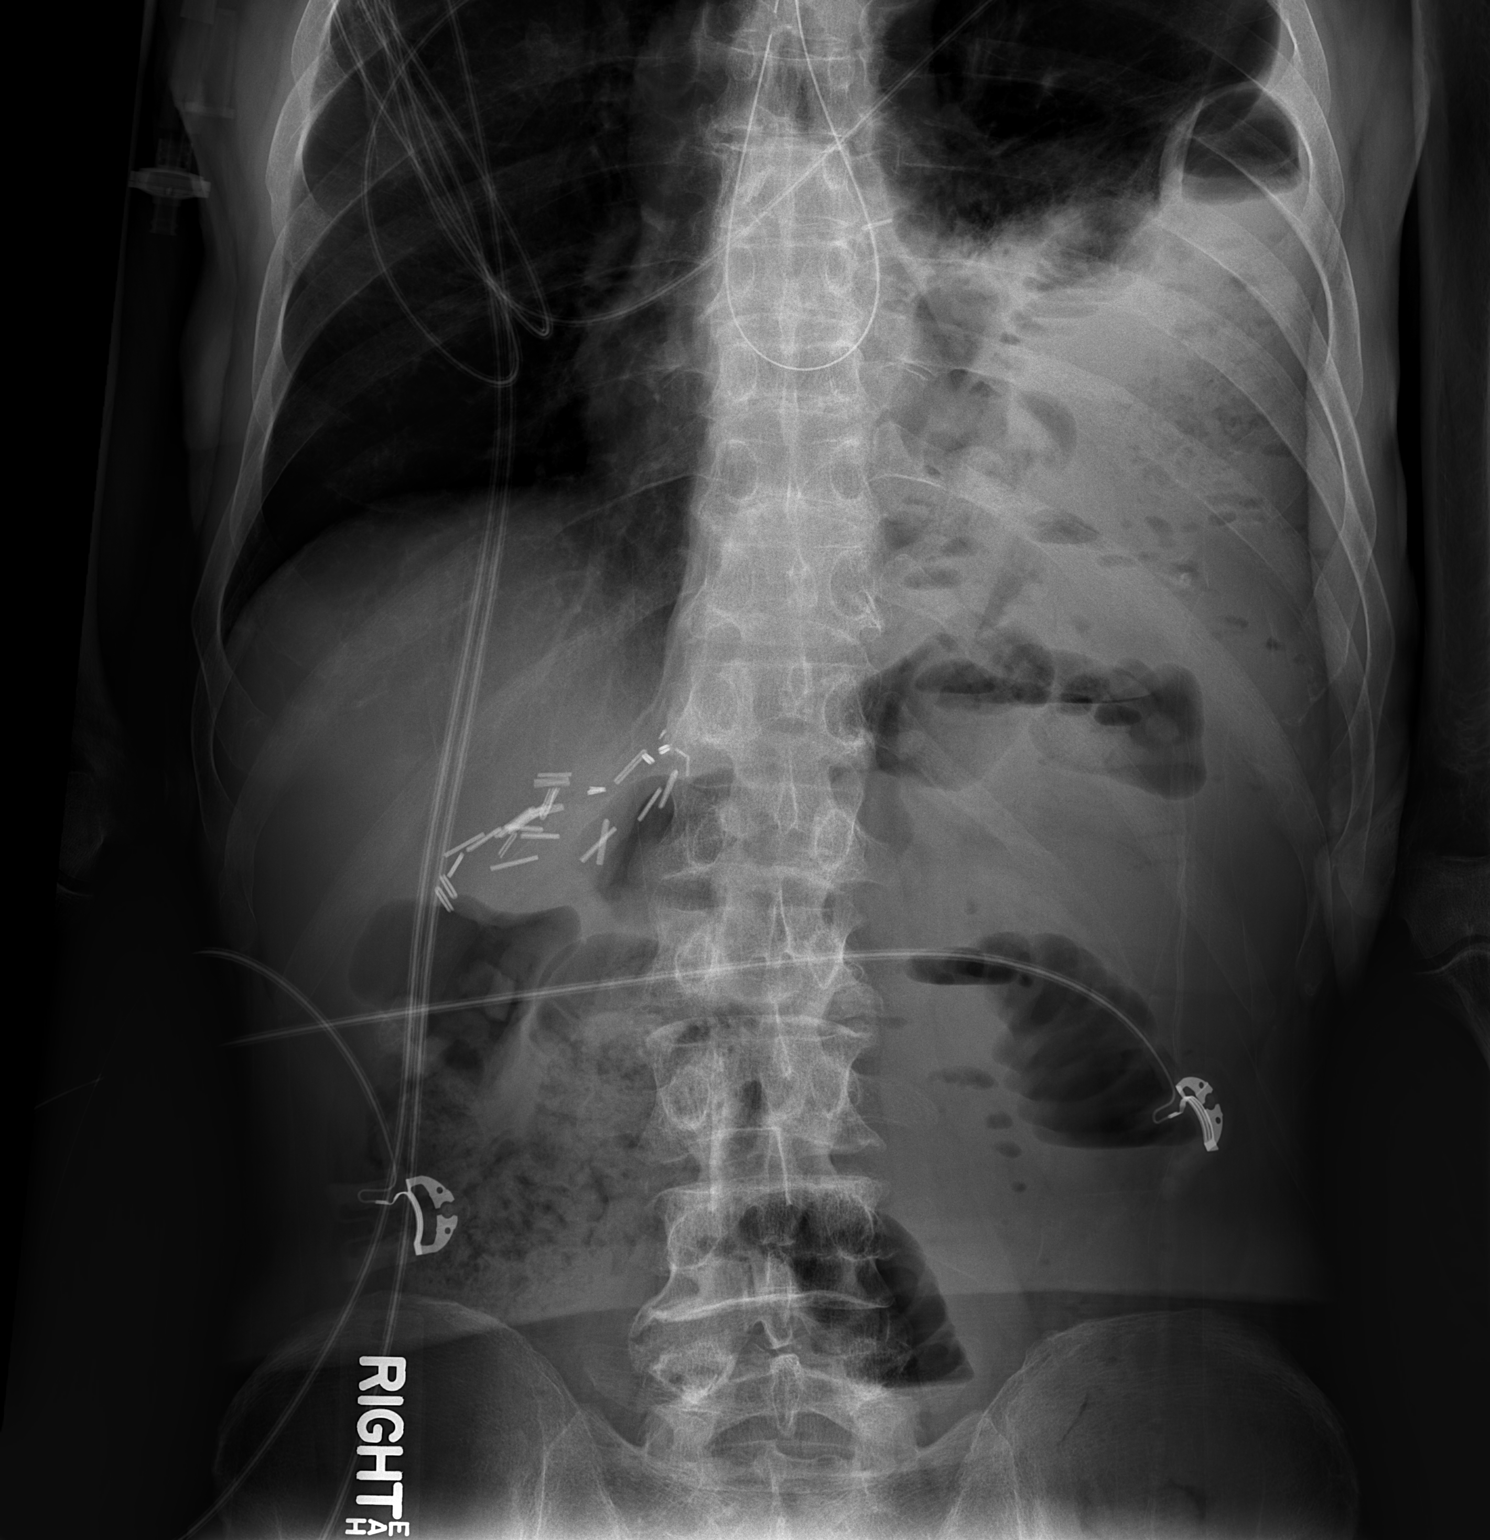

[t abdomen supine]
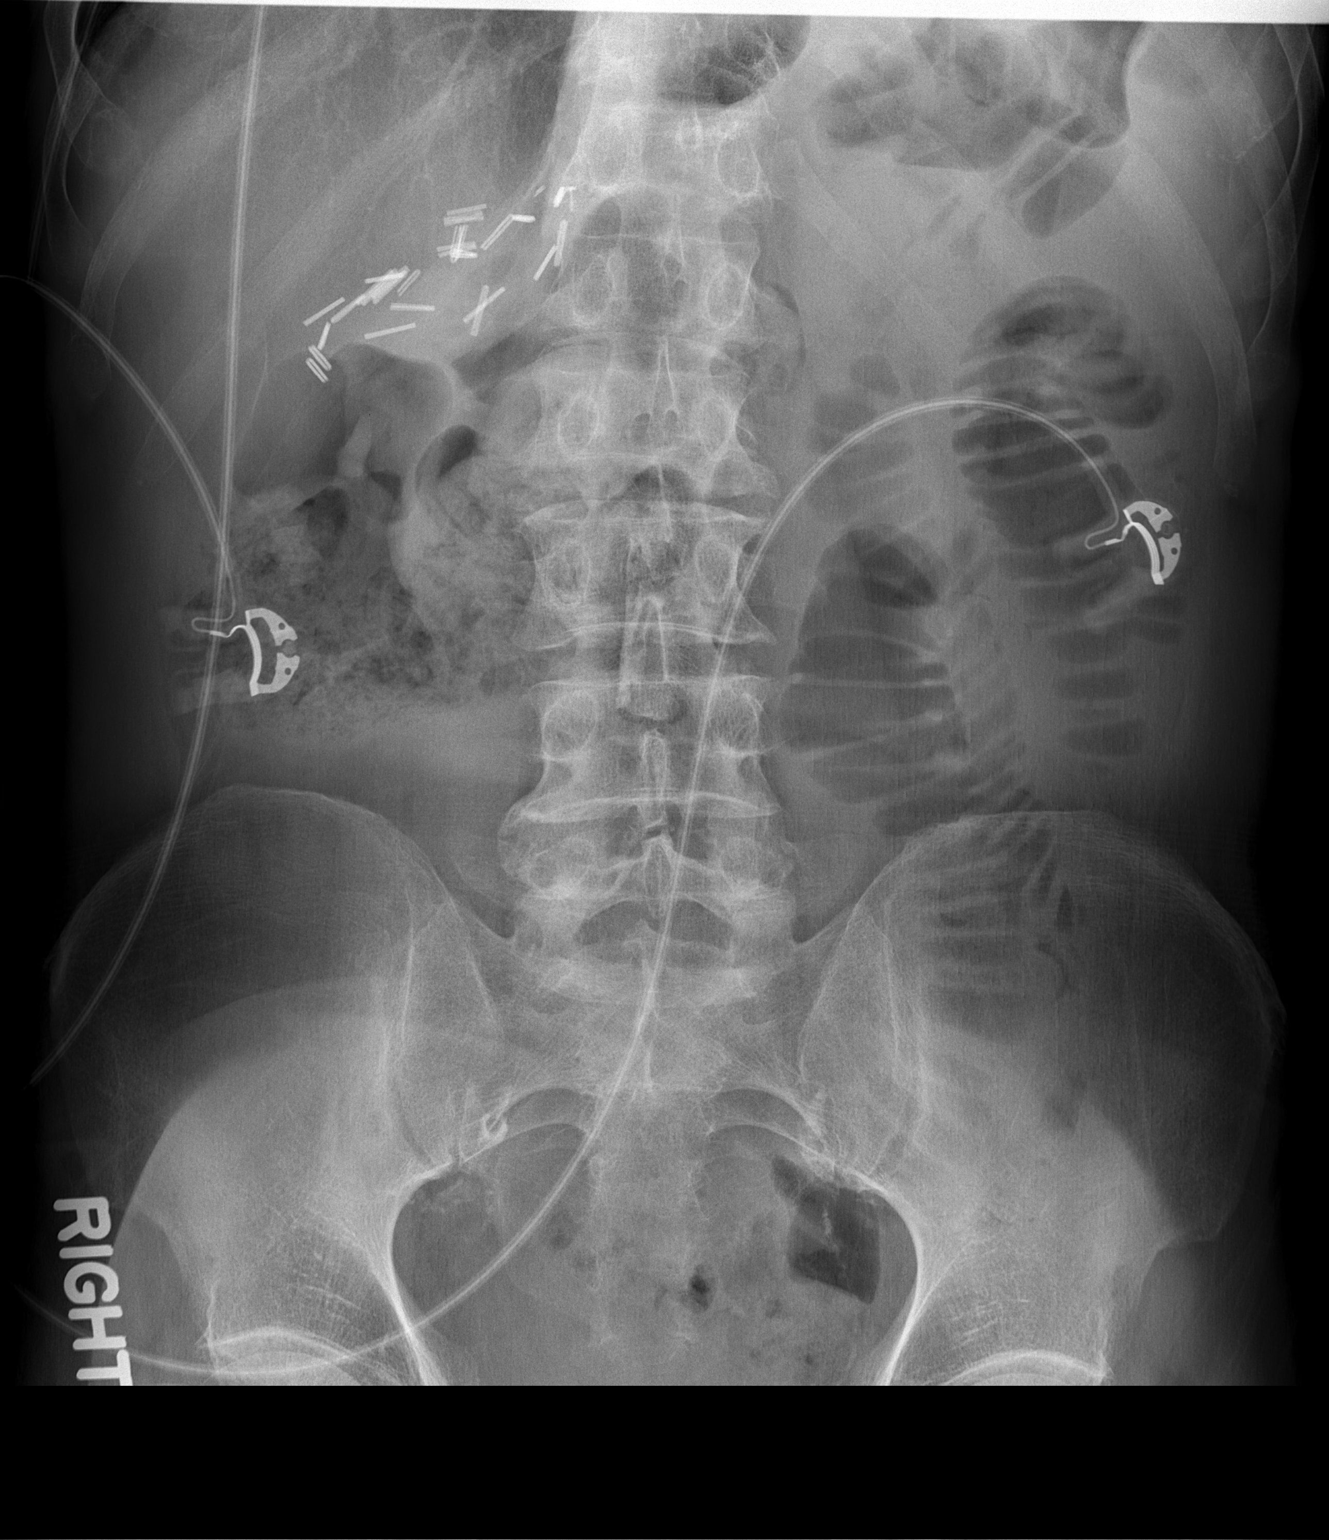

[2 of 2 positions shown; findings below may reference images not displayed]

FINDINGS: Upright and supine views of the abdomen were obtained.
Nasogastric tube is coiled in the mid esophagus and the tip is
pointing cephalad.  Again seen is a large left diaphragmatic hernia
containing loops of bowel.  There are dilated loops of small bowel
in the abdomen with air-fluid level suggesting an obstructive
process.  Multiple surgical clips in the right upper quadrant of
the abdomen.
IMPRESSION: Dilated loops of small bowel are consistent with a bowel
obstruction.

Nasogastric tube is coiled in the esophagus. This finding was
called to the patient's nurse, [place], on 10/22/2009 at [DATE] a.m.

## 2010-01-09 IMAGING — CR DG ABD PORTABLE 1V
1 series · 1 of 1 positions shown · non-contrast
Comparison: Plain film 10/22/2009 at [DATE] hours

CLINICAL DATA: NG tube placement

ABDOMEN - 1 VIEW

[view not recorded]
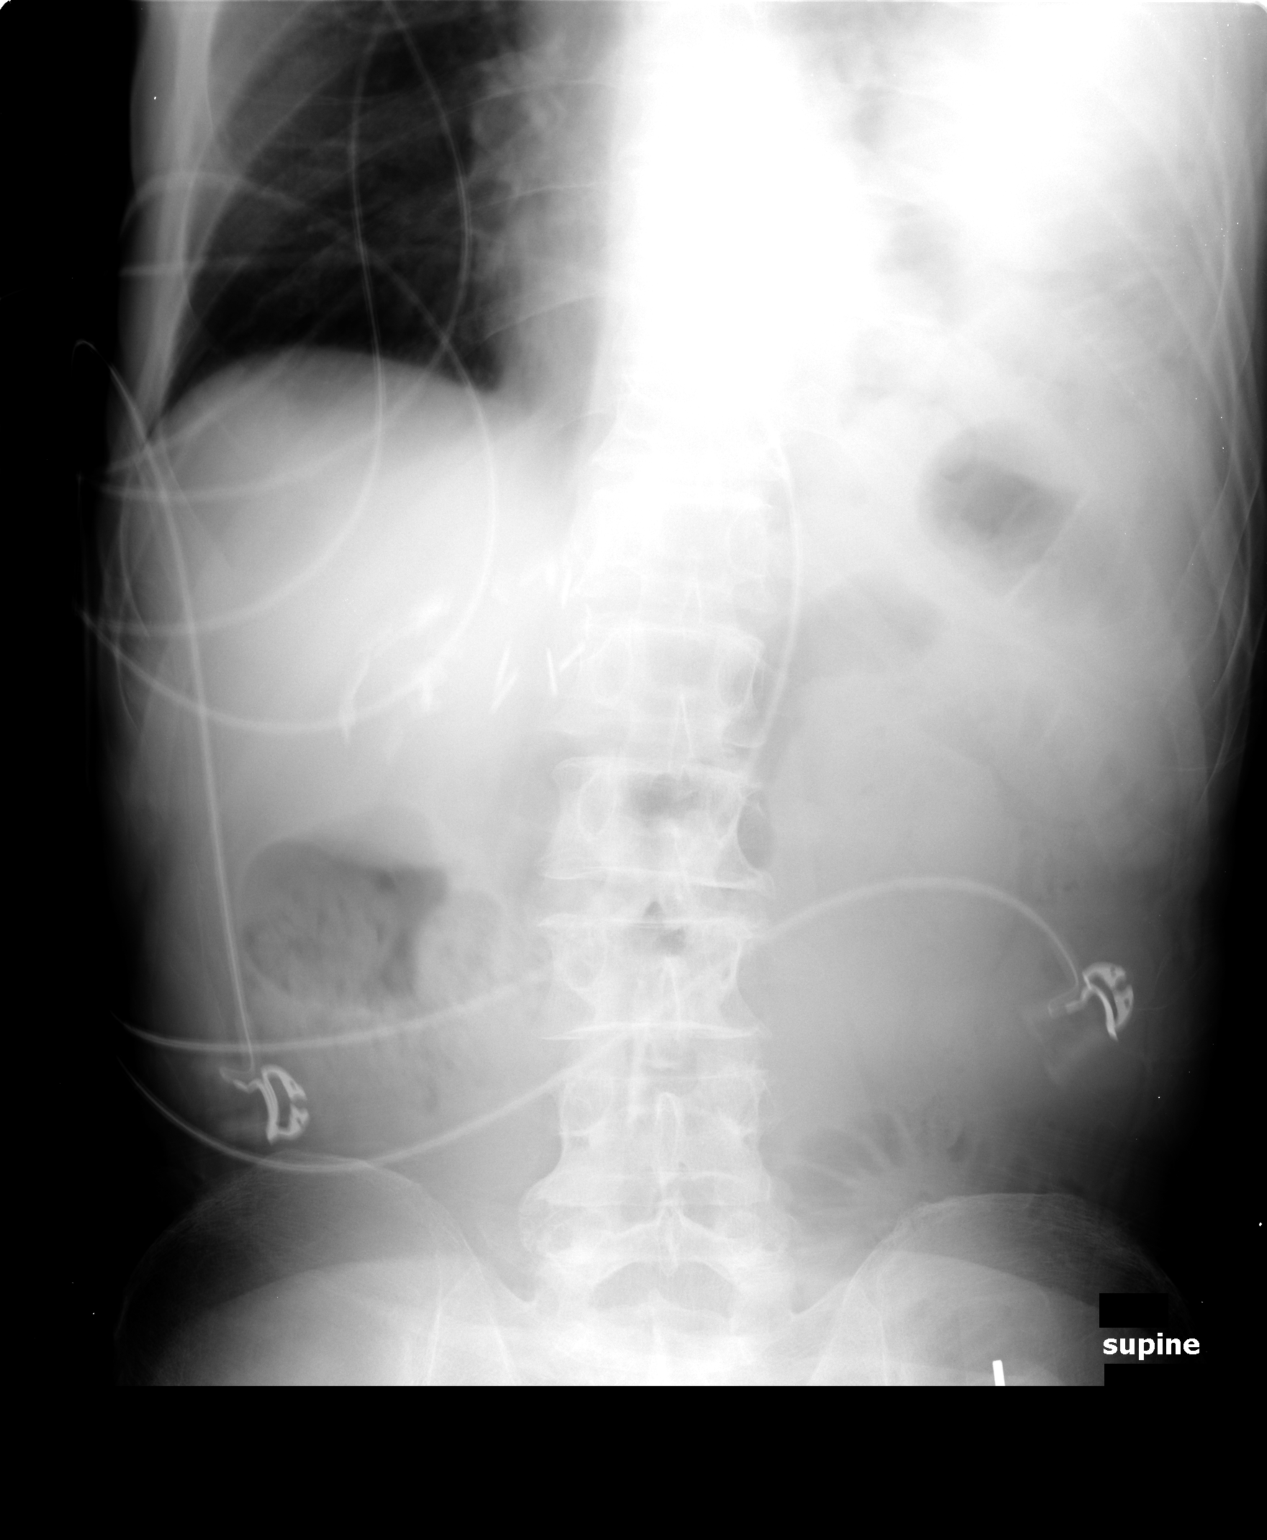

[1 of 1 positions shown; findings below may reference images not displayed]

FINDINGS: NG tube is  curved at the level of the GE junction.  The
majority of the loop has been removed from the NG tube.
IMPRESSION: NG tube curved at the level of GE junction.

## 2010-01-11 IMAGING — US IR US GUIDE VASC ACCESS LEFT
1 series · 1 of 1 positions shown · non-contrast
Comparison: none

CLINICAL DATA: Diaphragmatic hernia, small bowel obstruction, PICC
line for a T N A

UPPER EXTREMITY PICC PLACEMENT WITH ULTRASOUND AND FLUORO GUIDANCE
TECHNIQUE: The left arm was prepped with chlorhexidine, draped in
the usual sterile fashion using maximum barrier technique and
infiltrated locally with 1% Lidocaine.  Ultrasound demonstrated
patency of the left brachial vein, and this was documented with an
image.  Under real-time ultrasound guidance, this vein was accessed
with a 21 gauge micropuncture needle and image documentation was
performed.  The needle was exchanged over a guidewire for a peel-
away sheath through which a 5 French double lumen PICC trimmed to
47cm was advanced, positioned with its tip at the distal SVC/right
atrial junction.  Fluoroscopy during the procedure and fluoro spot
radiograph confirms appropriate catheter position.  The catheter
was flushed, secured to the skin with Prolene sutures, and covered
with a sterile dressing.  No immediate complication.

[Series 1: ir us guide vasc access left · 1 of 1 slices shown]
[im 1/1]
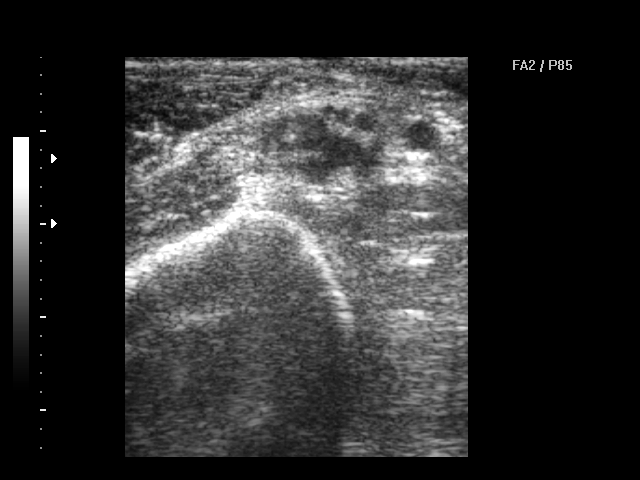

[1 of 1 positions shown; findings below may reference images not displayed]

Initially, right upper extremity micropuncture venous access was
performed of the right brachial vein.  The guide wire would not
advance centrally.  Limited contrast injection confirms chronic
central occlusion of the right subclavian and innominate veins.
Right chest wall intercostal collaterals are noted.  Therefore PICC
line was placed on the left side as described.

Fluoroscopy Time: 1.3 minutes.
IMPRESSION: Technically successful left arm PICC placement with ultrasound and
fluoroscopic guidance.  The catheter is ready for use.

## 2010-01-12 IMAGING — CR DG BE W/ CM - WO/W KUB
4 series · 4 of 4 positions shown · IV contrast (agent unspecified)
Comparison: CT examination 10/13/2009.  Radiograph examination
10/22/2009.

CLINICAL DATA: History of small-bowel obstruction and left
diaphragmatic hernia.

SINGLE CONTRAST BARIUM ENEMA
Fluoroscopy Time: 2.3 minutes
Contrast: 750 ml of Zmnipaque-IQQ diluted with 750 ml of water.

[view not recorded (1 of 4)]
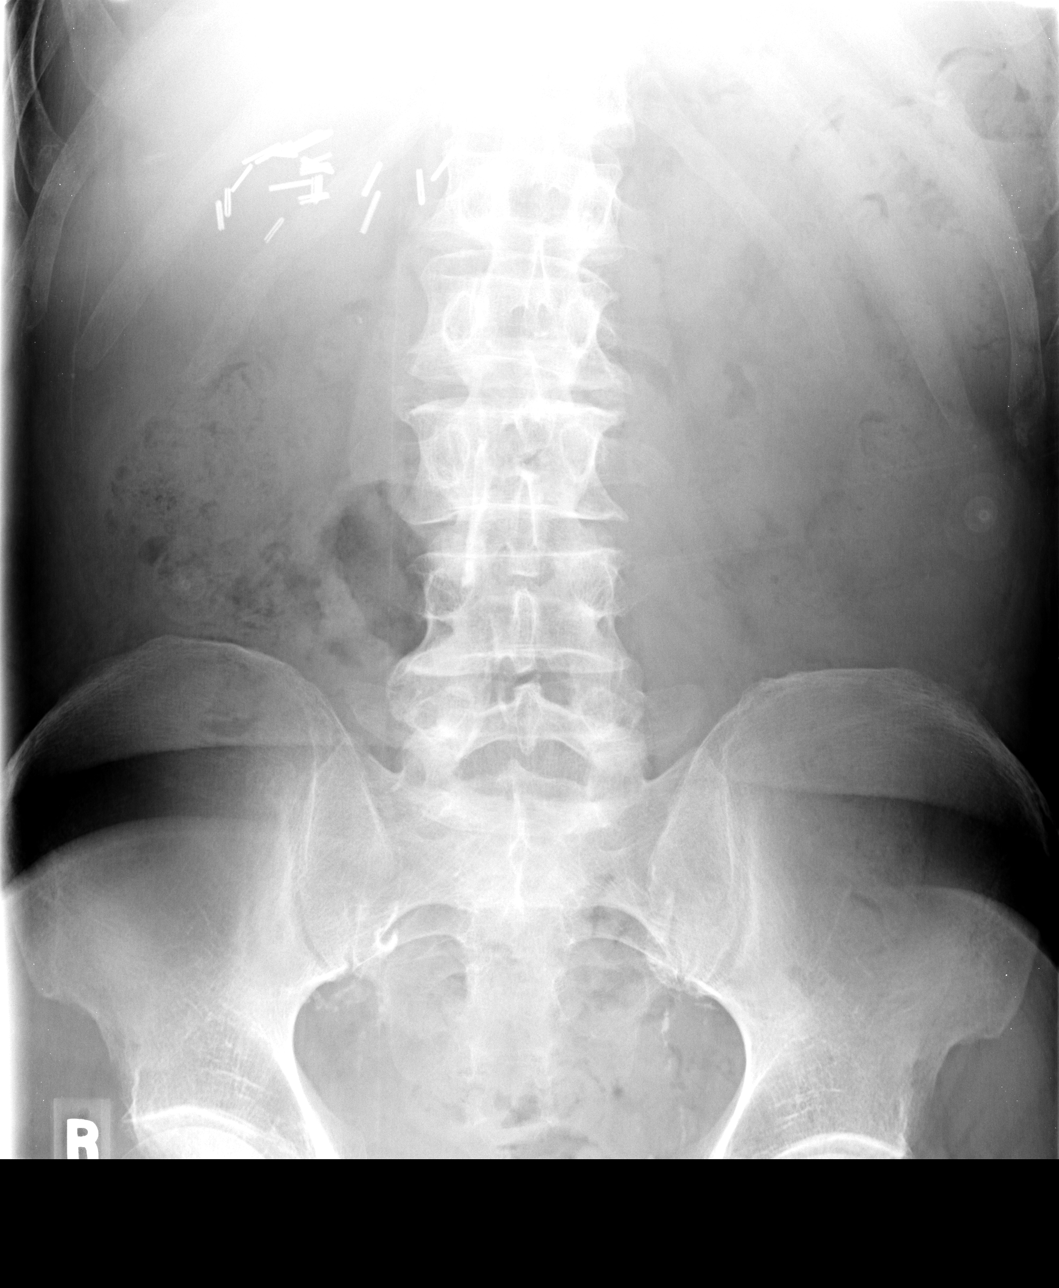

[view not recorded (2 of 4)]
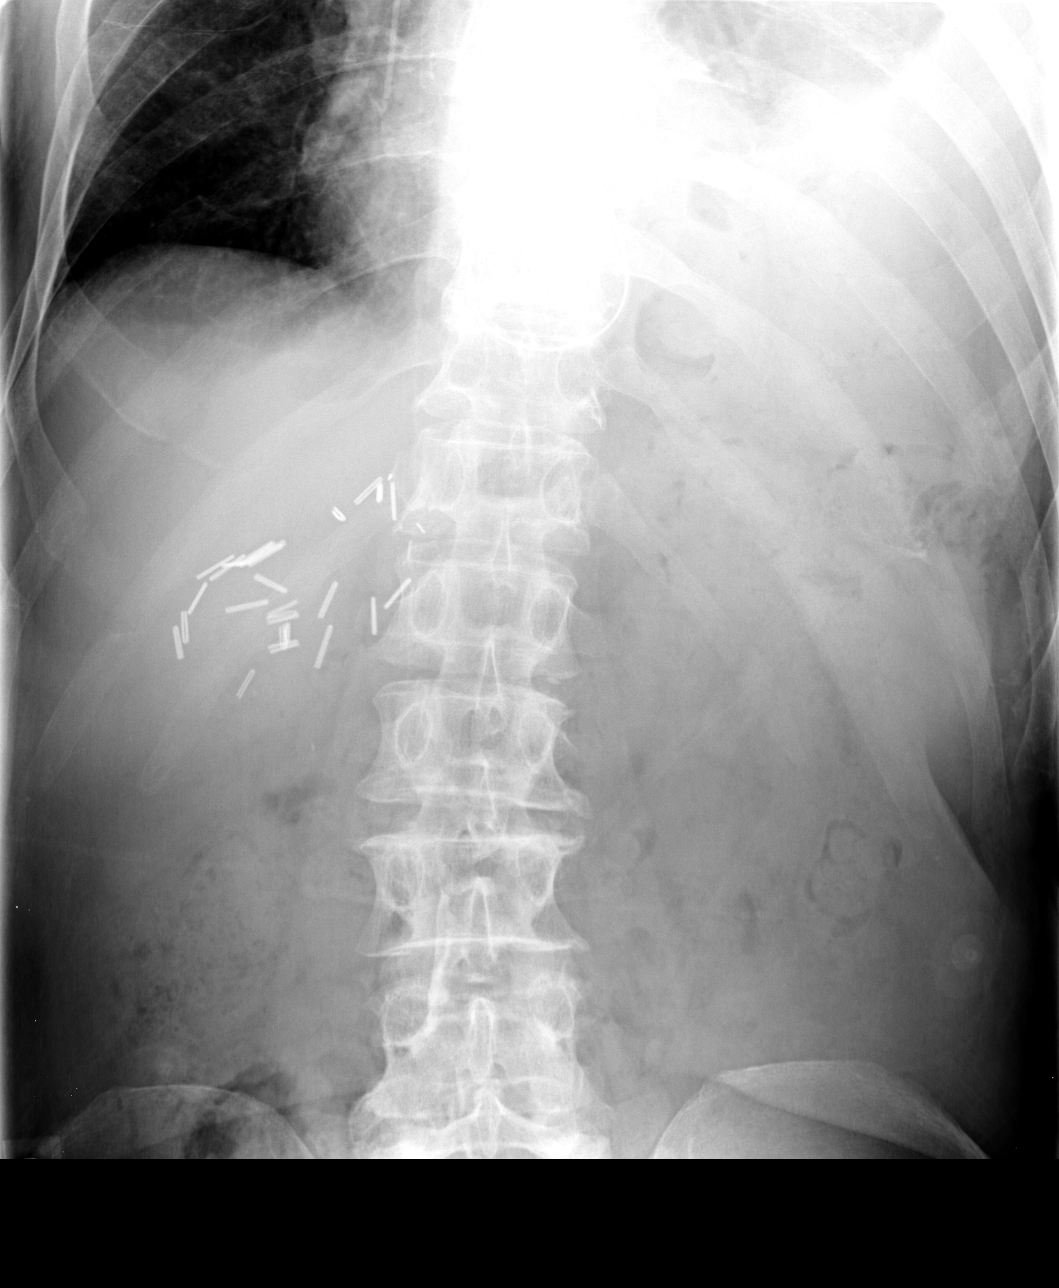

[view not recorded (3 of 4)]
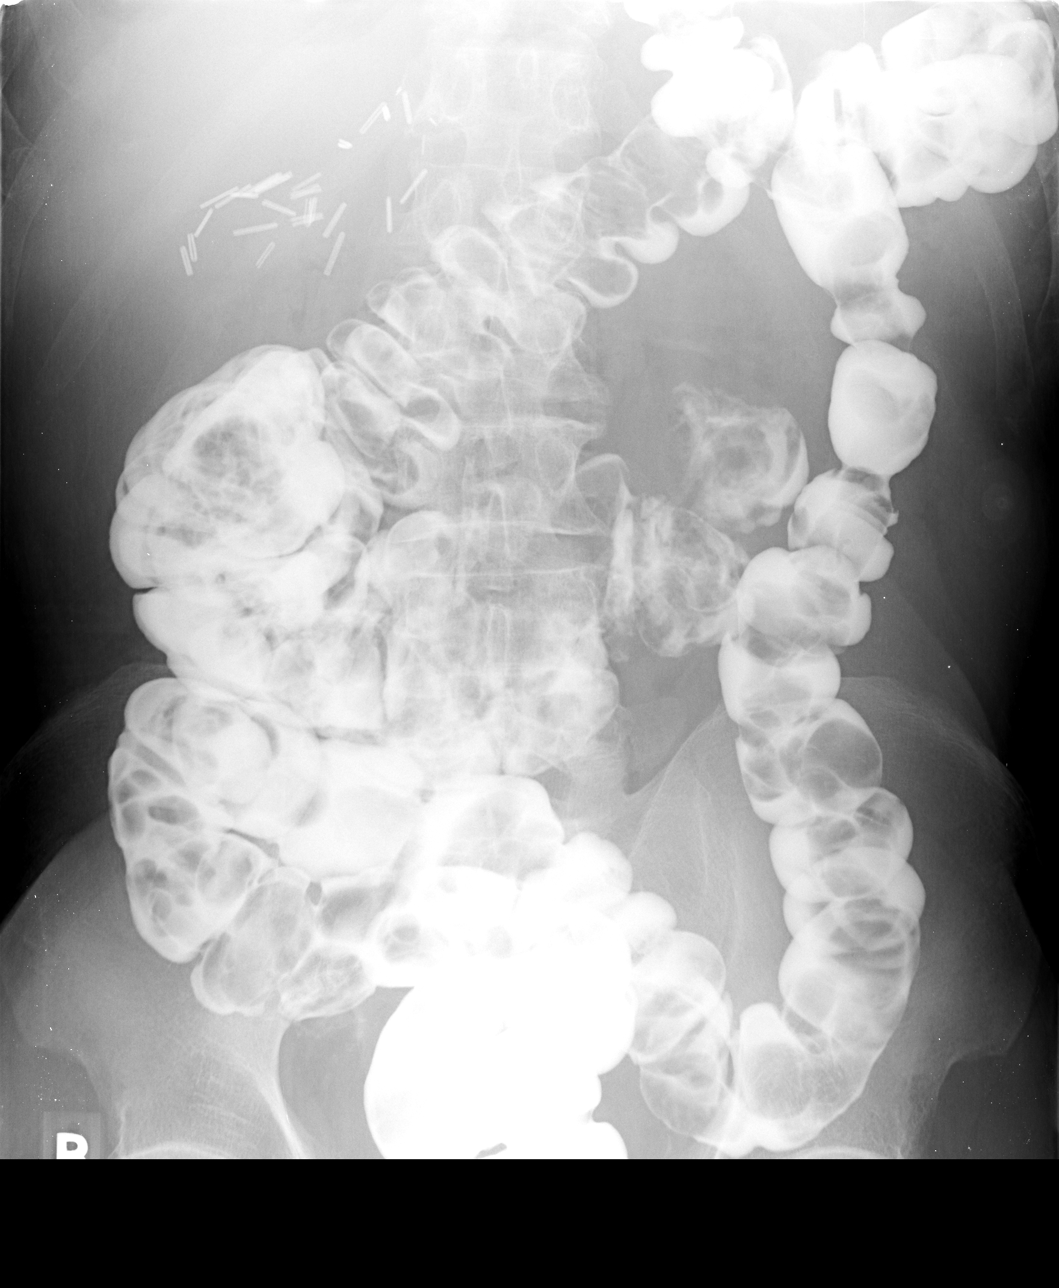

[view not recorded (4 of 4)]
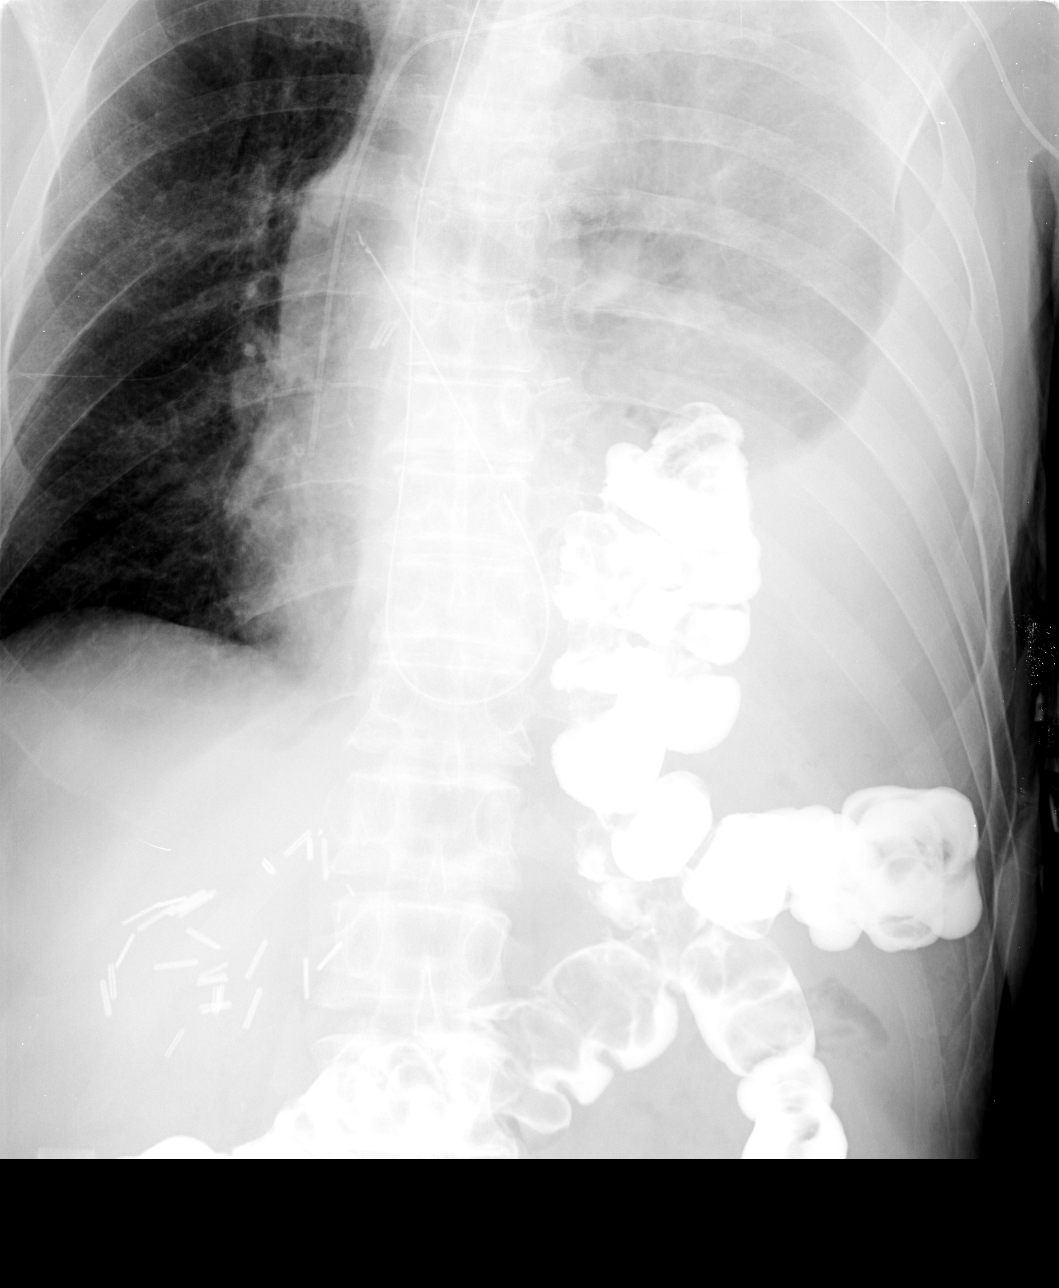

[4 of 4 positions shown; findings below may reference images not displayed]

FINDINGS: There is evidence of previous right upper abdominal
surgery.  There is degenerative spondylosis.

There is moderate fecal distention of portions of the colon.  No
definite dilated small bowel distended with gas is evident.  I
cannot exclude fluid-filled loops of small intestine.

  There now appears to be a left pleural effusion with associated
left basilar atelectasis.  The patient has a known left
hemidiaphragmatic hernia.

An enteric tube is in place.  The patient has had previous partial
esophagectomy with esophagogastric anastomosis .  The enteric tube
extends into the thoracic portion of the stomach then coils back on
itself and extends superiorly.

Water soluble contrast was used.  The entire colon was filled with
contrast.  I was unable to reflux significant amount of contrast to
the terminal ileum.  Fecal material was encountered but no
obstruction was seen.  The splenic flexure of the colon is seen to
extend above the expected the level of the left hemidiaphragm
consistent with this portion of the colon being involving the left
hemidiaphragmatic hernia.  No colon obstruction is evident.  No
colon mass cannot be identified.  Detail is compromised by the
fecal material.  No inflammatory changes were evident.
IMPRESSION: There now appears to be a left pleural effusion with associated
left basilar atelectasis.  The patient has a known left
hemidiaphragmatic hernia.

An enteric tube is in place.  The patient has had previous partial
esophagectomy with esophagogastric anastomosis .  The enteric tube
extends into the thoracic portion of the stomach then coils back on
itself and extends superiorly.

Enema shows that the splenic flexure of the colon is herniated into
the lower left hemithorax.  There is no evidence of obstruction of
the colon.  No colon lesion was identified.

## 2010-01-13 IMAGING — RF DG SMALL BOWEL
8 series · 8 of 8 positions shown · non-contrast
Comparison: 10/22/2009.

CLINICAL DATA: .  Rule out small bowel obstruction.  History of
esophageal cancer and diaphragmatic hernia.

SMALL BOWEL SERIES
TECHNIQUE: Following ingestion of a mixture of thin barium and
Entero Vu, serial small bowel images were obtained including spot
views of the terminal ileum.
Fluoroscopy time:  1.6 minutes.

[Series 1: run · 1 of 1 slices shown (1 of 8)]
[im 1/1]
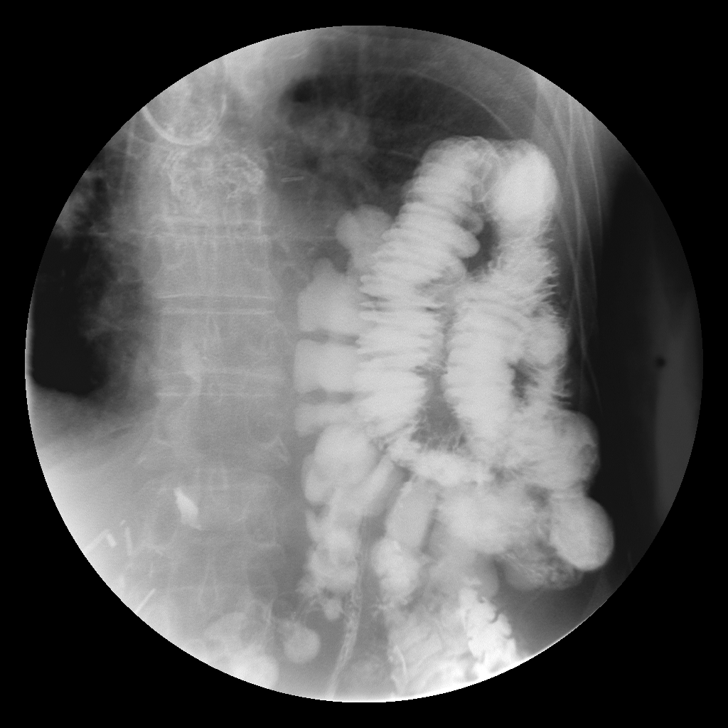

[Series 2: run · 1 of 1 slices shown (2 of 8)]
[im 1/1]
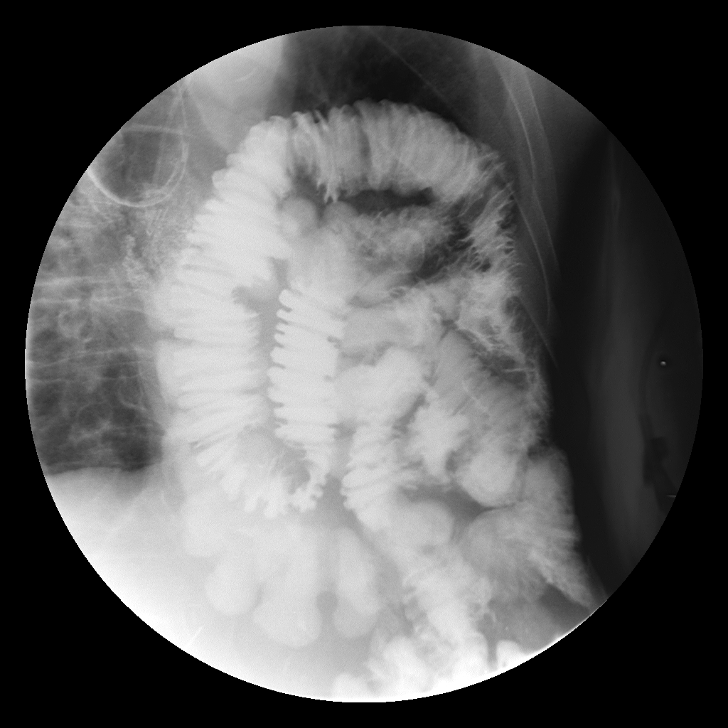

[Series 3: run · 1 of 1 slices shown (3 of 8)]
[im 1/1]
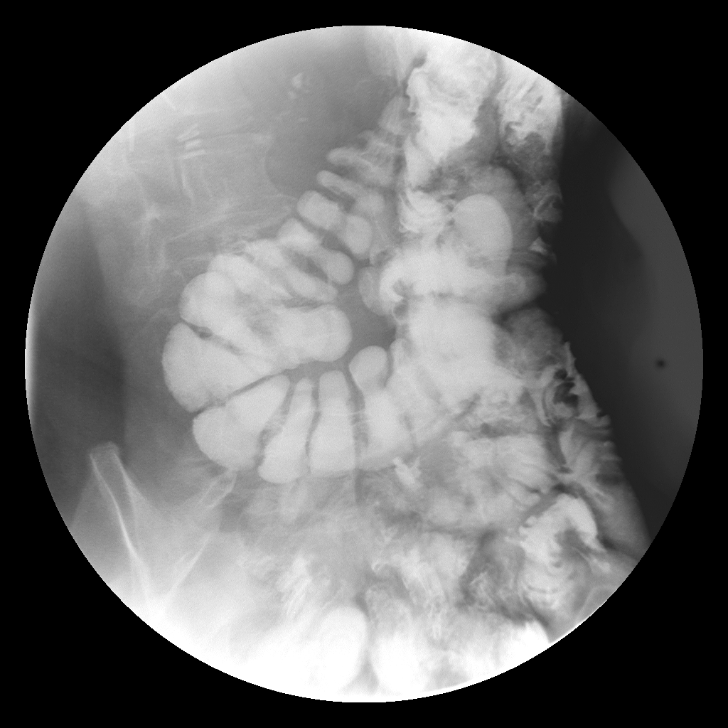

[Series 4: run · 1 of 1 slices shown (4 of 8)]
[im 1/1]
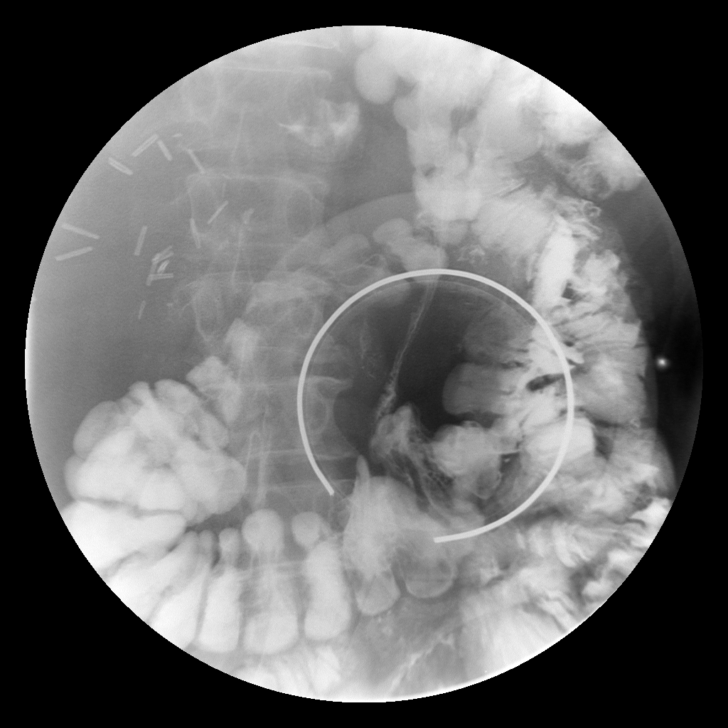

[Series 5: run · 1 of 1 slices shown (5 of 8)]
[im 1/1]
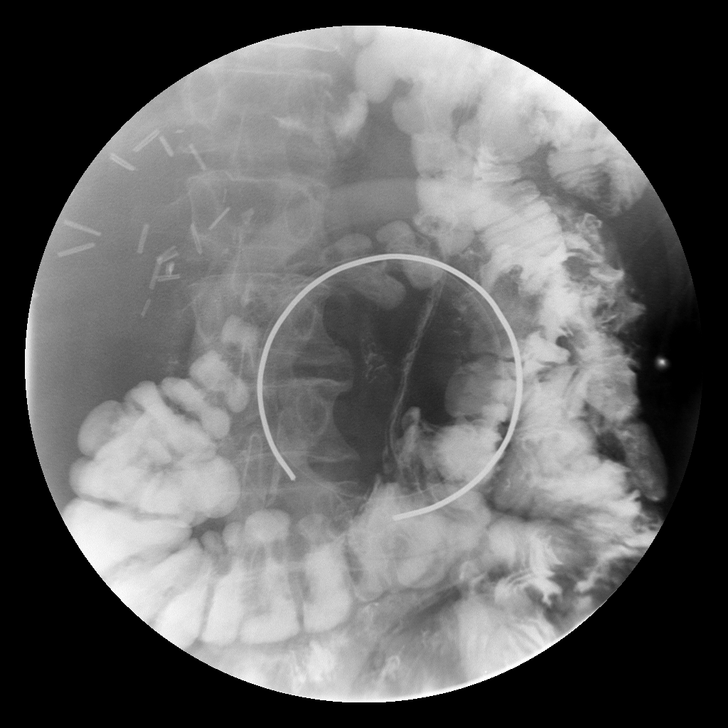

[Series 6: run · 1 of 1 slices shown (6 of 8)]
[im 1/1]
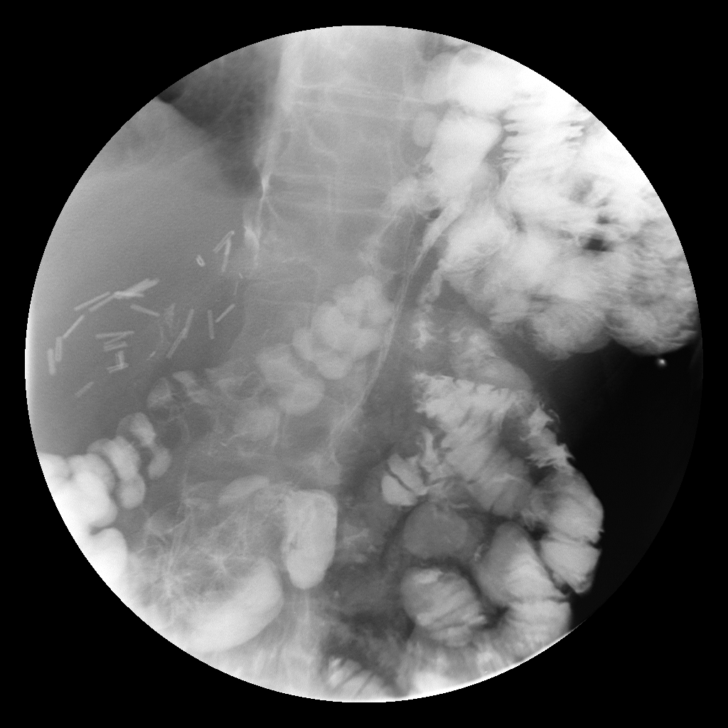

[Series 7: run · 1 of 1 slices shown (7 of 8)]
[im 1/1]
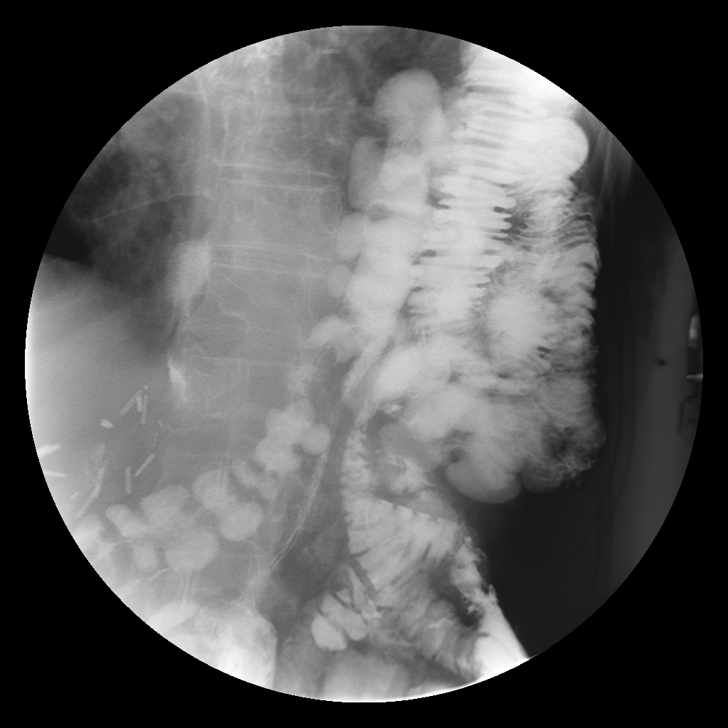

[Series 8: run · 1 of 1 slices shown (8 of 8)]
[im 1/1]
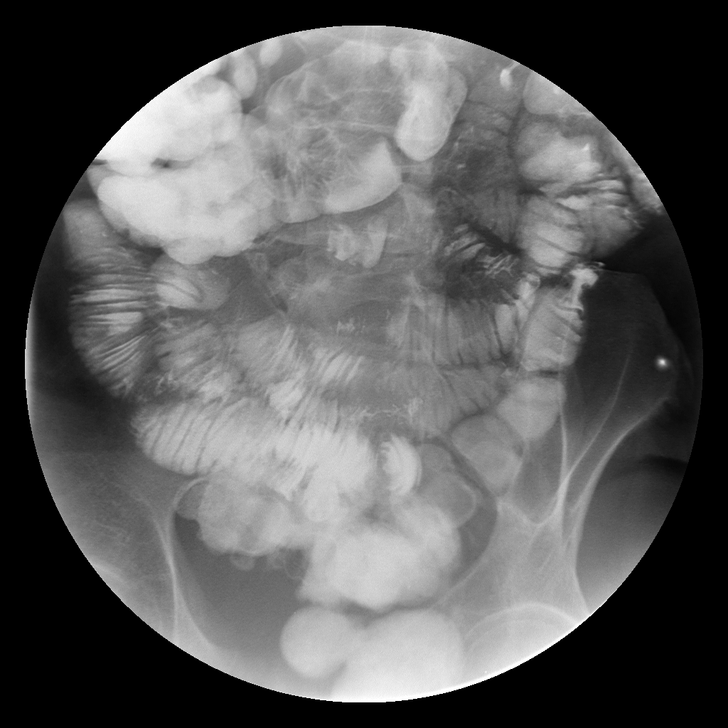

[8 of 8 positions shown; findings below may reference images not displayed]

FINDINGS: Preliminary image of the abdomen reveals no dilated
small bowel.  There is contrast in the colon from yesterday's
enema.  This was reviewed with Dr. Geusa and a decision was
made to pursue the small bowel follow-through to evaluate for
obstruction.  Diaphragmatic hernia is present on the left.

The patient received 340 ml barium via the NG tube.  Serial images
were obtained.  There has been gastric pull-up which appears
patent.  The duodenum is patent.  The small bowel is not dilated.
Small bowel mucosal pattern is normal.  Barium enters the colon at
approximately 1 hour 30-minute.  The transverse colon and splenic
flexure and jejunum are in the diaphragmatic hernia on the left.
Terminal ileum is stretched down to the ileocecal valve but does
not show any focal stricture. There appears to be malrotation of
the small bowel which may be due to the diaphragmatic hernia.
Fluoroscopy and palpation of the small bowel reveals no bowel edema
or obstruction.
IMPRESSION: Negative for small bowel obstruction.

Large diaphragmatic hernia on the left containing colon and small
bowel.

There appears to be malrotation of the small bowel with stretching
of the terminal ileum.  There is jejunum and ileum in the left
upper quadrant related to either malrotation or the diaphragmatic
hernia.

## 2010-01-13 IMAGING — CR DG ABDOMEN 2V
2 series · 2 of 2 positions shown · non-contrast
Comparison: 10/25/2009

CLINICAL DATA: Partial small bowel obstruction

ABDOMEN - 2 VIEW

[w abdomen upright]
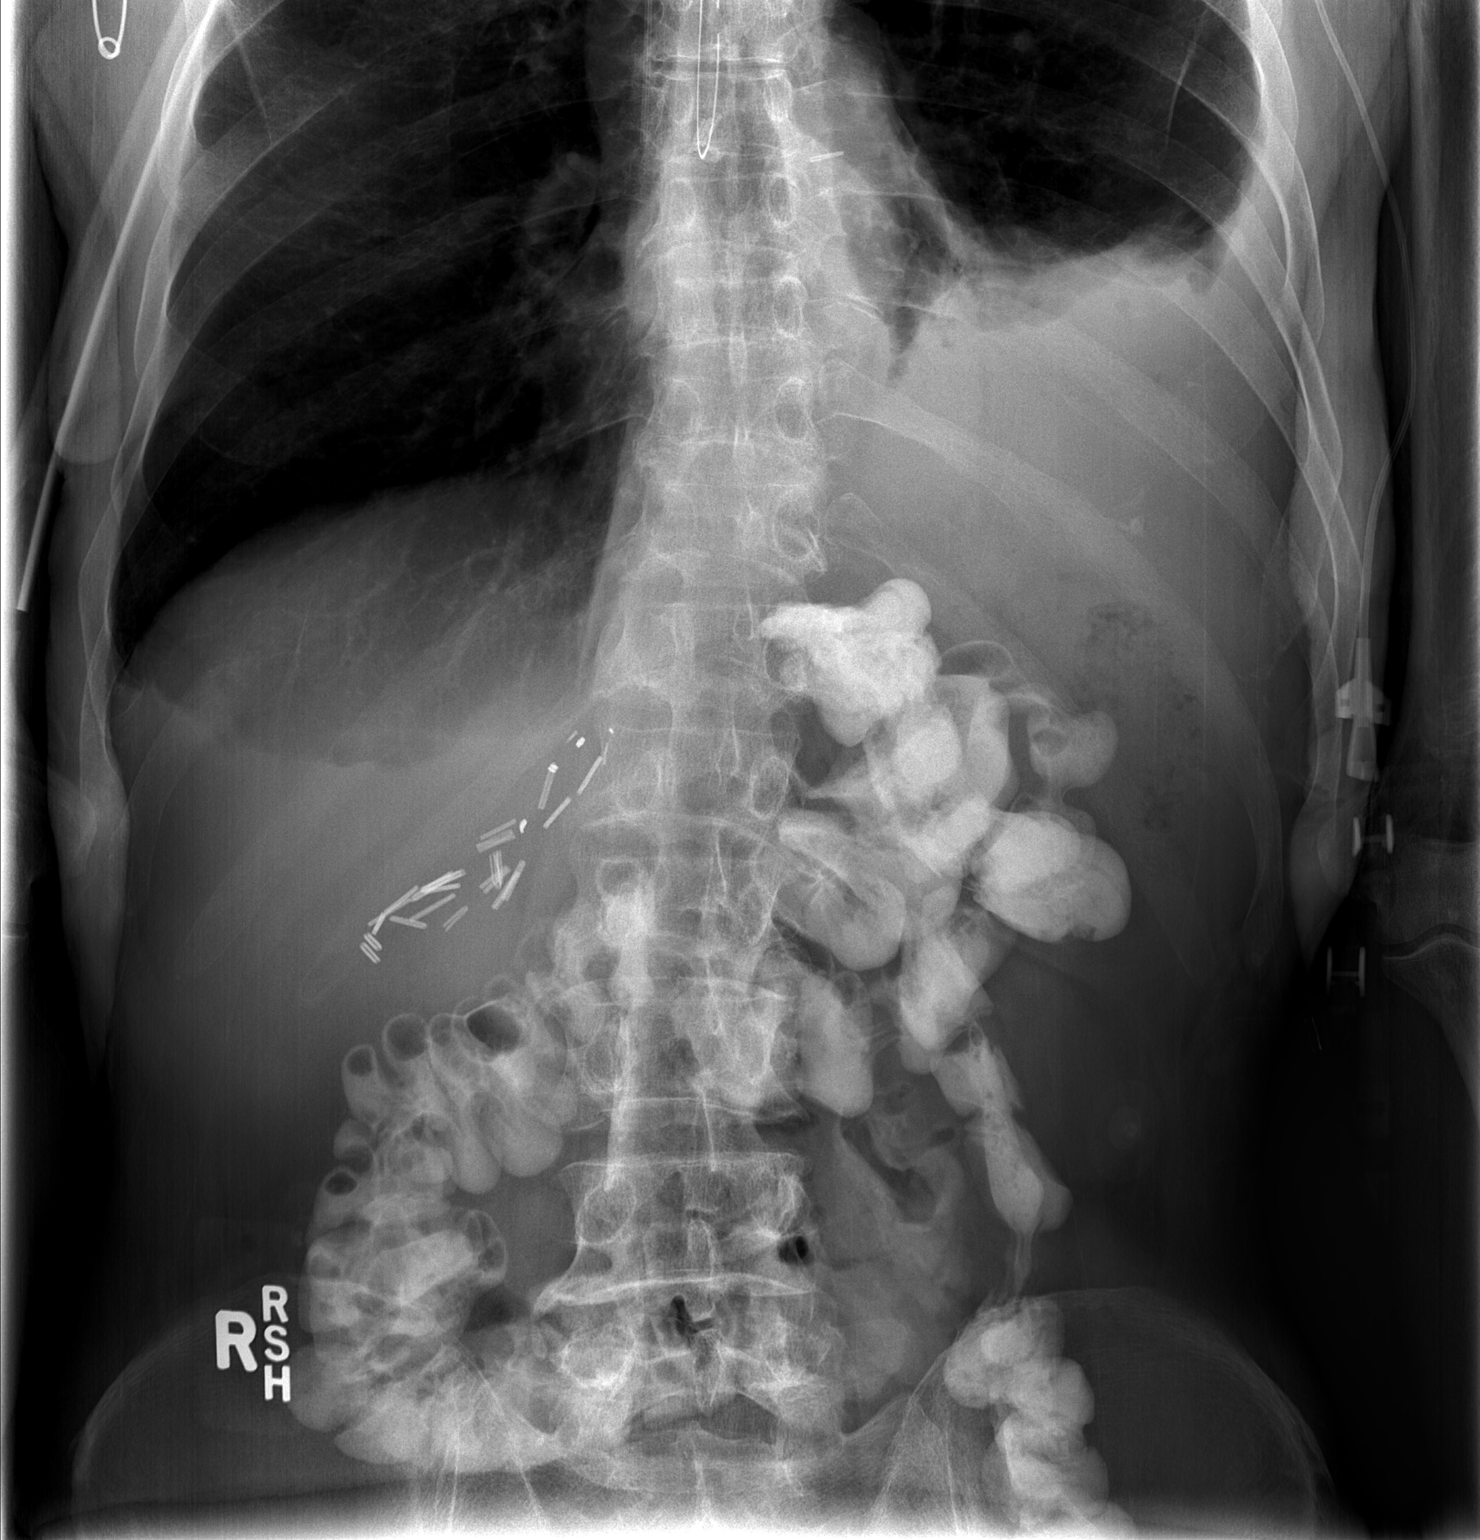

[t abdomen supine]
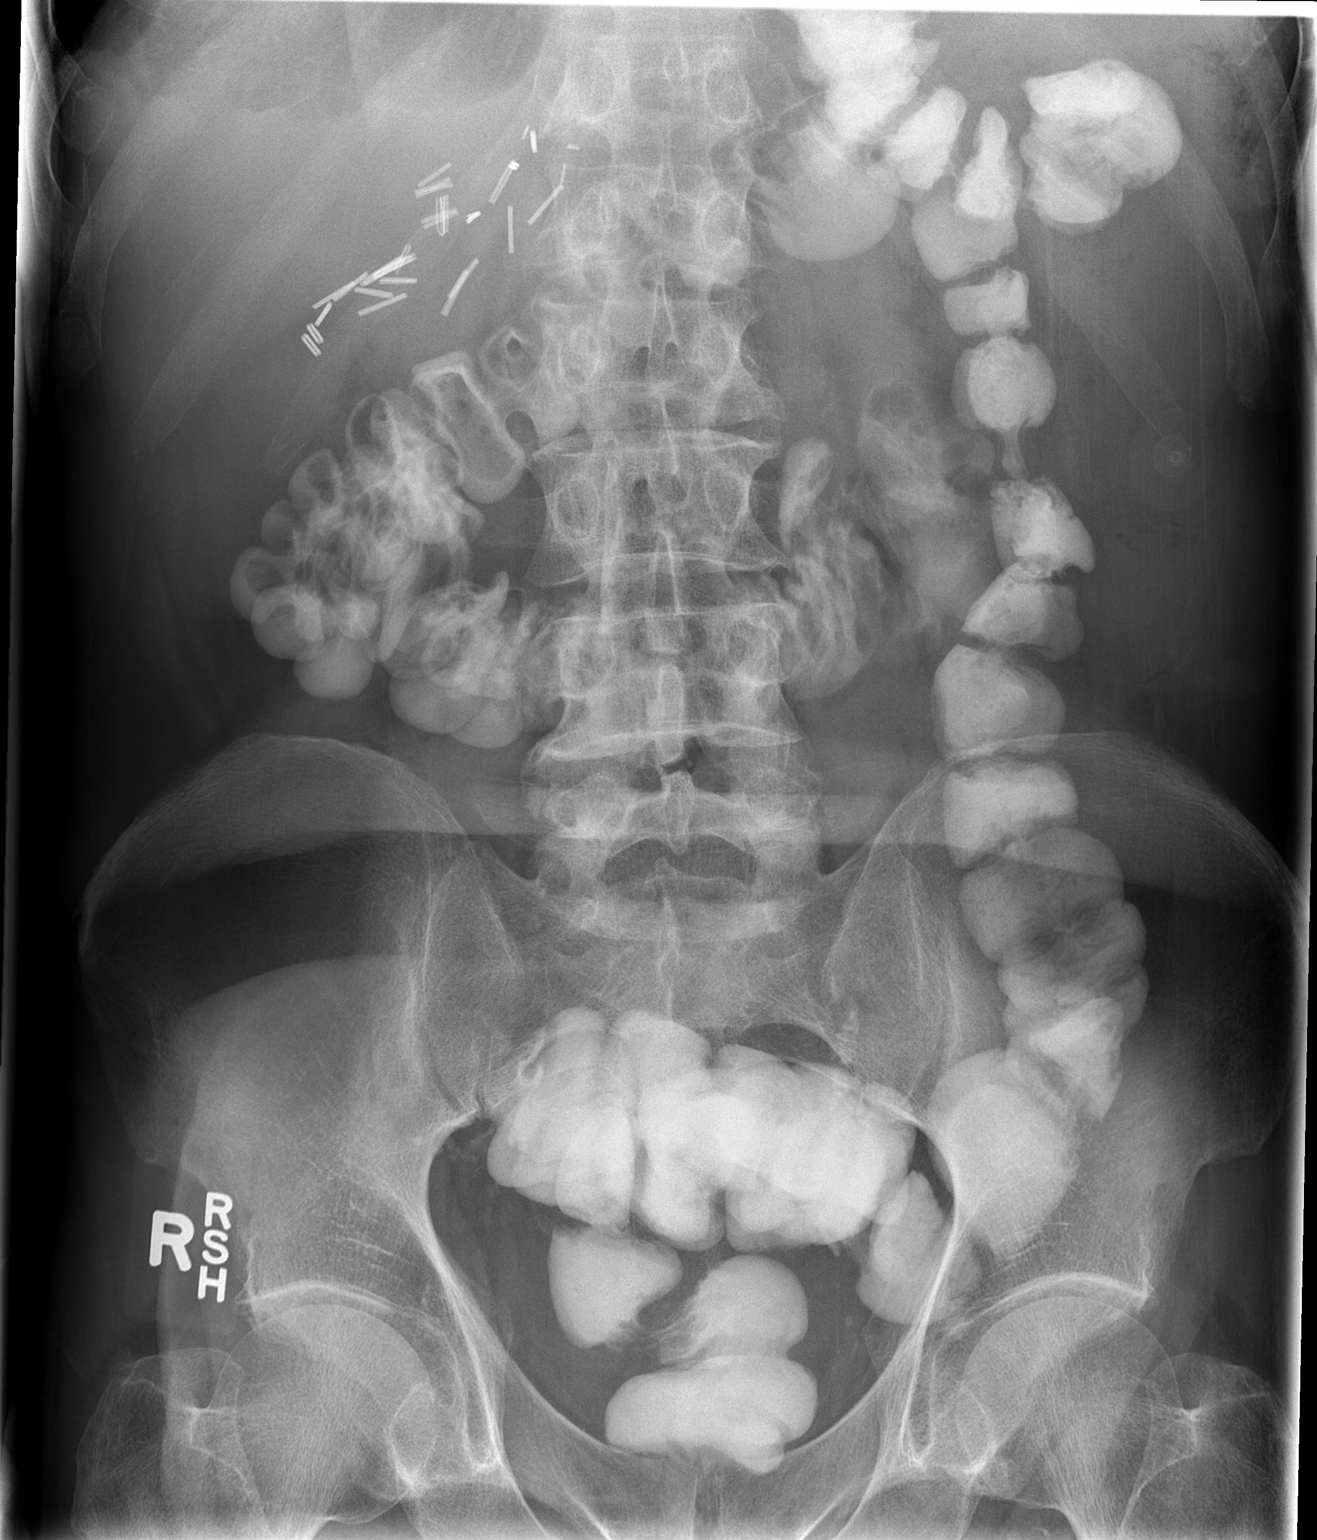

[2 of 2 positions shown; findings below may reference images not displayed]

FINDINGS: Large left pleural effusion is again noted.  This is
unchanged in volume from prior exam.  Smaller right effusion is
also stable.

There is enteric contrast material within the colon.

No abnormally dilated loops of small bowel or air-fluid levels
noted.

When compared with the previous examination the amount of barium
has decreased from the colon.
IMPRESSION: 1.  No dilated loops of small bowel identified.

2.  Decreased in colonic barium

## 2010-01-15 IMAGING — CR DG CHEST 1V PORT
1 series · 1 of 1 positions shown · non-contrast
Comparison: 10/27/2009

CLINICAL DATA: Small bowel obstruction.

PORTABLE CHEST - 1 VIEW

[view not recorded]
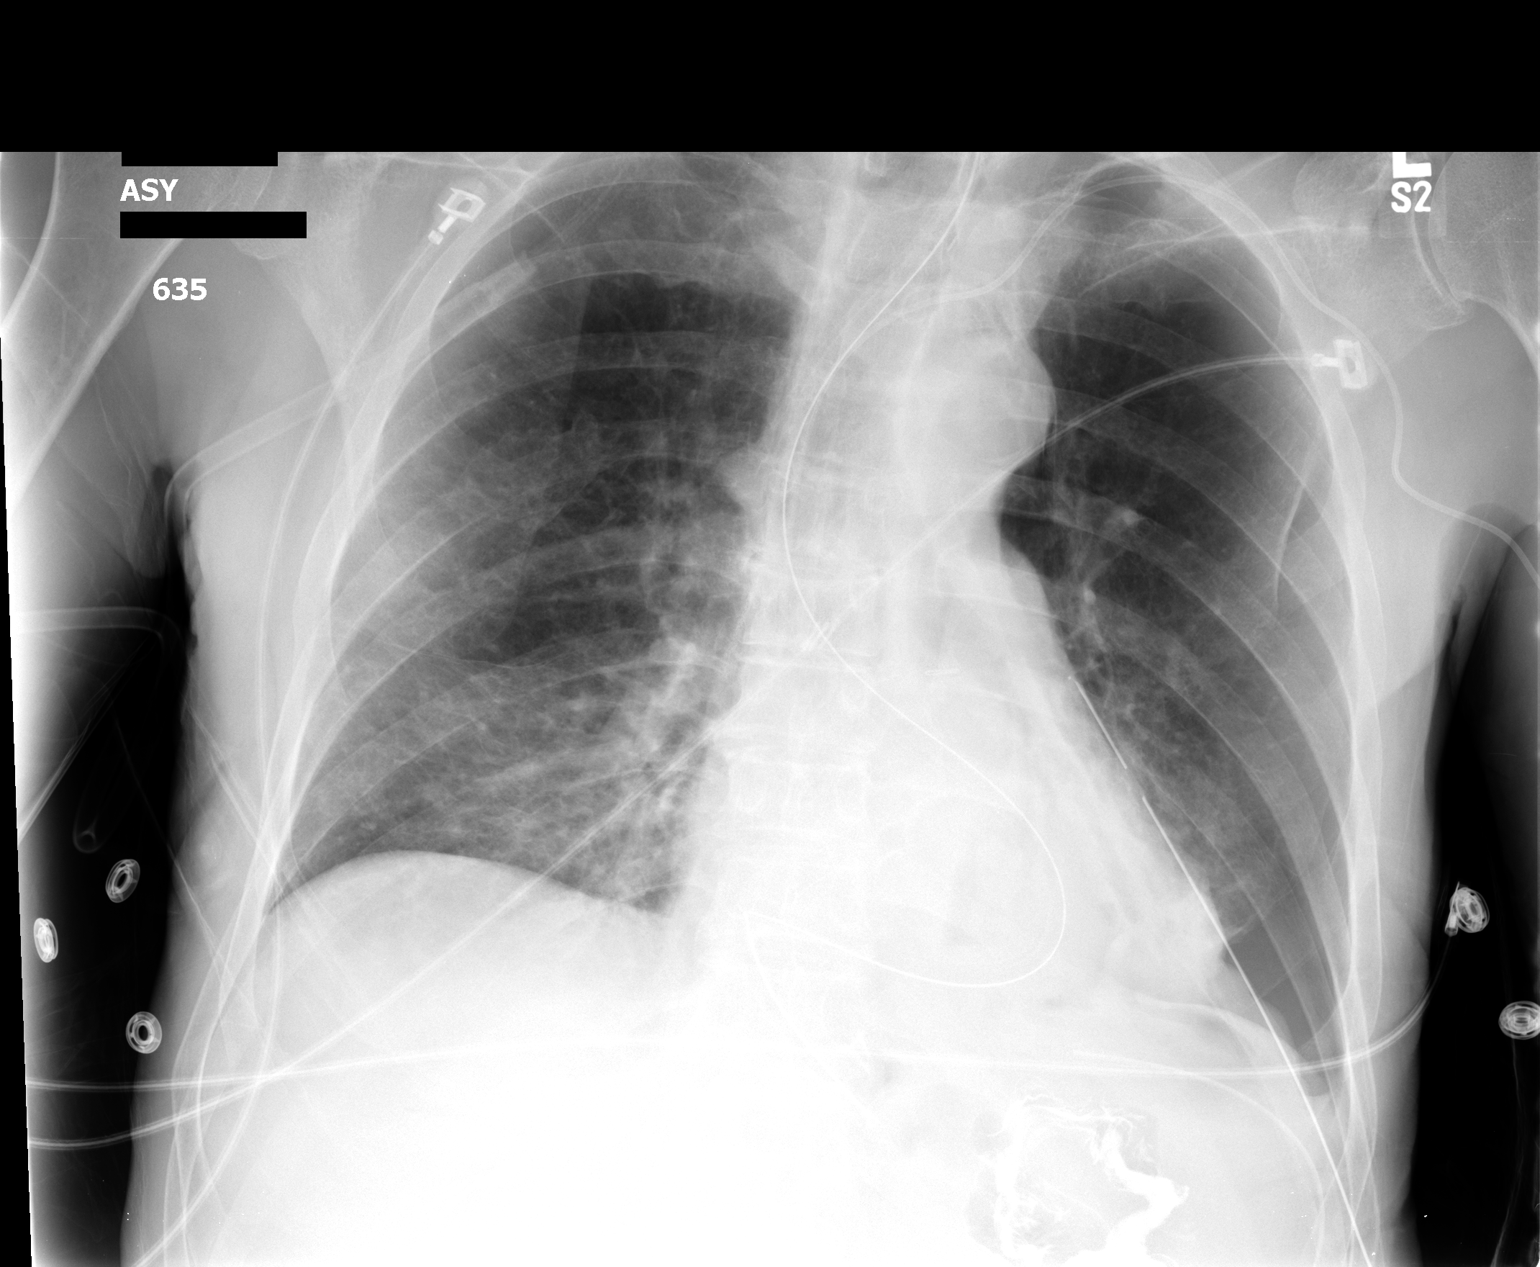

[1 of 1 positions shown; findings below may reference images not displayed]

FINDINGS: Left chest tube remains in place with stable left-sided
pneumothorax.  Left PICC line is in stable position.  Appearance of
the lungs unchanged.  Minimal bibasilar atelectasis.
IMPRESSION: No significant change.

## 2010-01-16 IMAGING — CR DG CHEST 1V PORT
1 series · 1 of 1 positions shown · non-contrast
Comparison: 10/28/2009

CLINICAL DATA: Follow-up pneumothorax.

PORTABLE CHEST - 1 VIEW

[view not recorded]
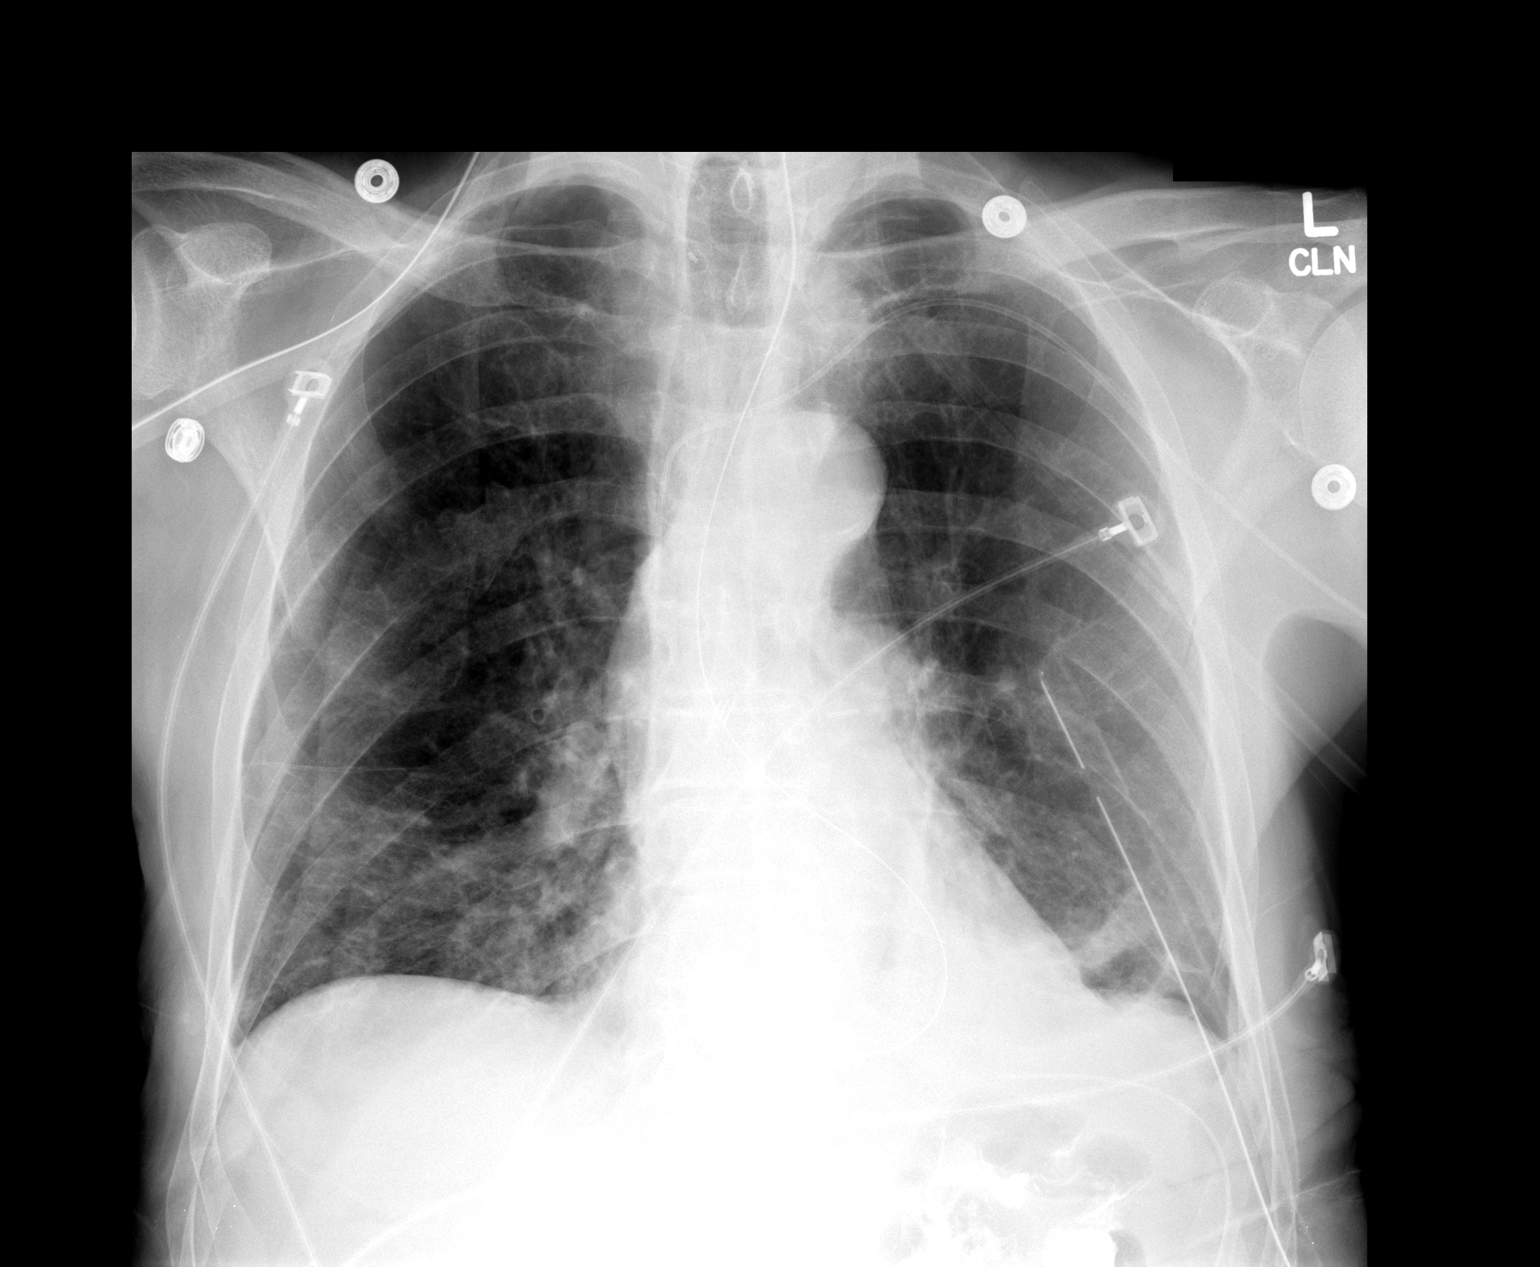

[1 of 1 positions shown; findings below may reference images not displayed]

FINDINGS: An NG tube is again identified with a kink at the side-
hole.
A left thoracostomy tube and left-sided PICC line are unchanged.

The left pneumothorax is decreased in size with only tiny residual
left lateral basilar pneumothorax noted.
Left lower lung atelectasis is present.
The cardiomediastinal silhouette is stable.
IMPRESSION: Decreased left pneumothorax with tiny residual left lateral basilar
component.

No other significant changes identified.

## 2010-01-17 IMAGING — CR DG ABD PORTABLE 1V
1 series · 1 of 1 positions shown · non-contrast
Comparison: 10/26/2009

CLINICAL DATA: Diaphragmatic hernia - small bowel obstruction.
Postoperative study.

ABDOMEN - 1 VIEW

[view not recorded]
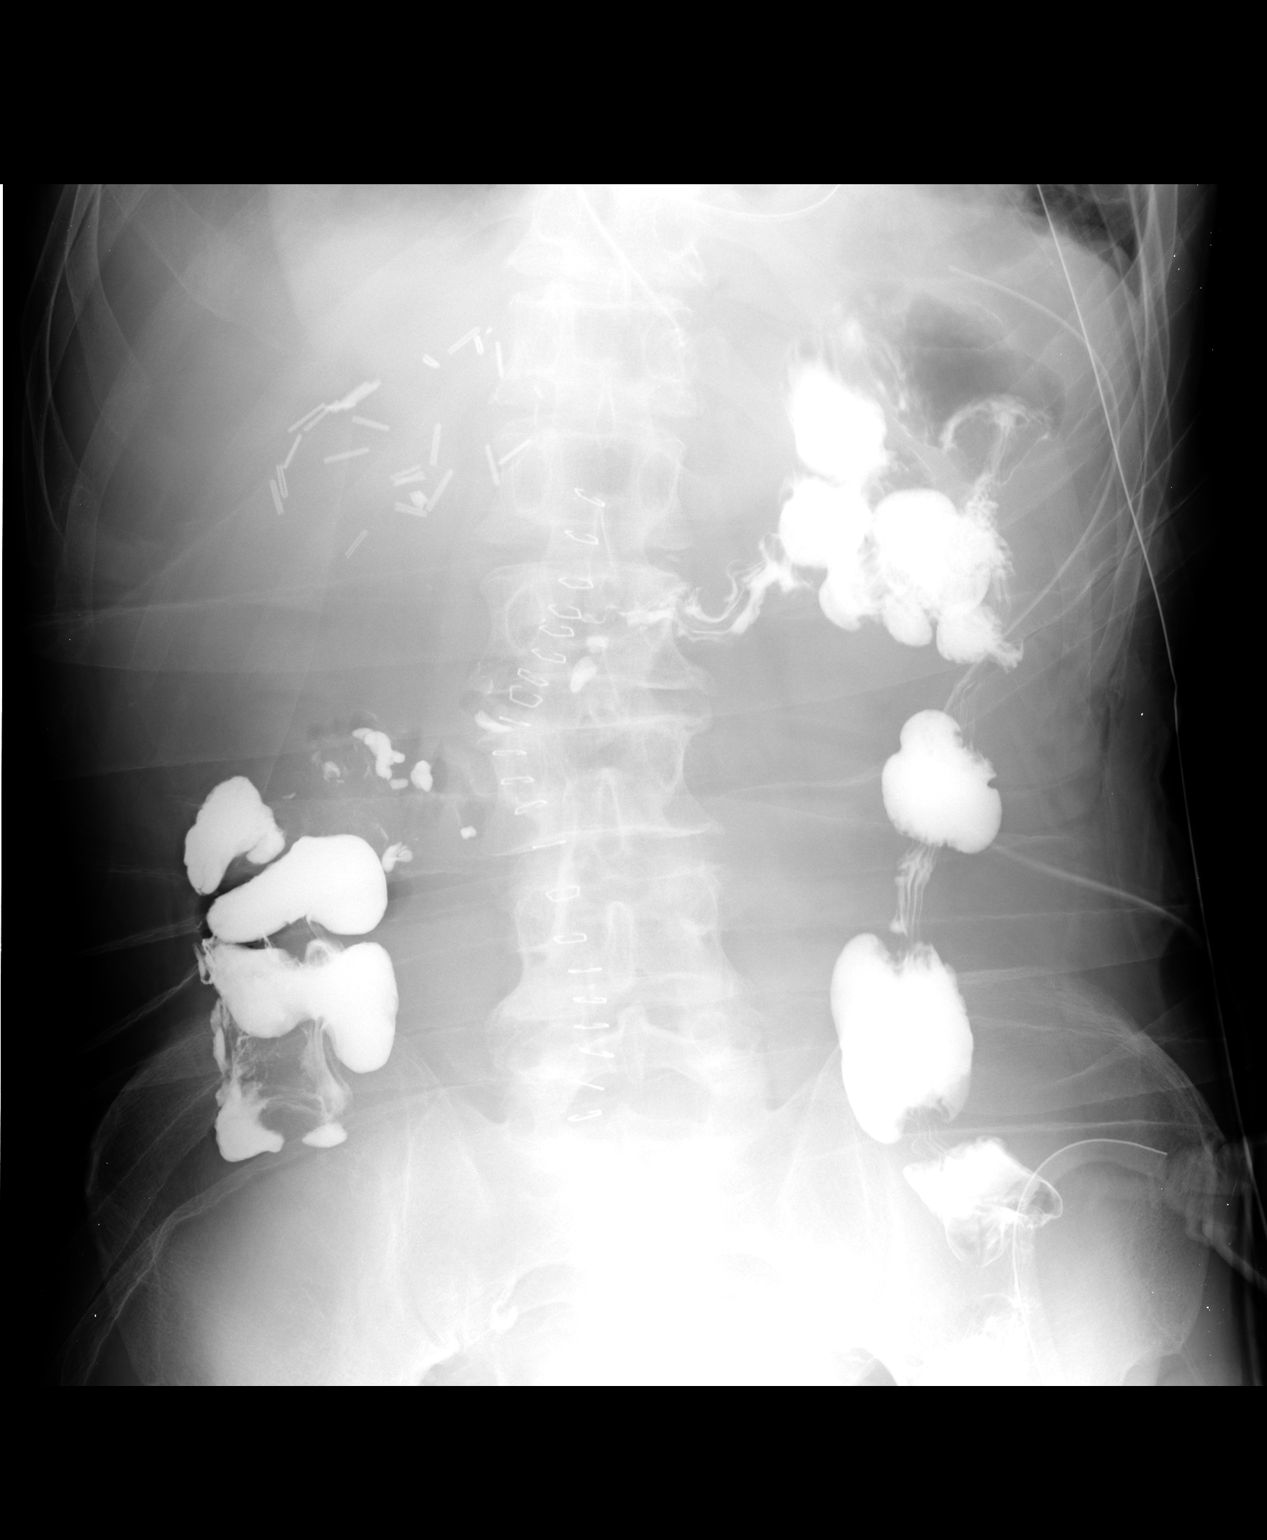

[1 of 1 positions shown; findings below may reference images not displayed]

FINDINGS: Contrast in the colon is identified.
There is no evidence of dilated bowel loops.
Postoperative changes within the abdomen are present with left
thoracostomy tube and NG tube.
No acute abnormalities are identified.
IMPRESSION: Unremarkable bowel gas pattern with contrast in the colon again
noted.

No evidence of dilated bowel loops/bowel obstruction.

## 2010-01-17 IMAGING — CR DG CHEST 1V PORT
1 series · 1 of 1 positions shown · non-contrast
Comparison: 10/29/2009

CLINICAL DATA: Diaphragmatic hernia.  Postoperative.

PORTABLE CHEST - 1 VIEW

[view not recorded]
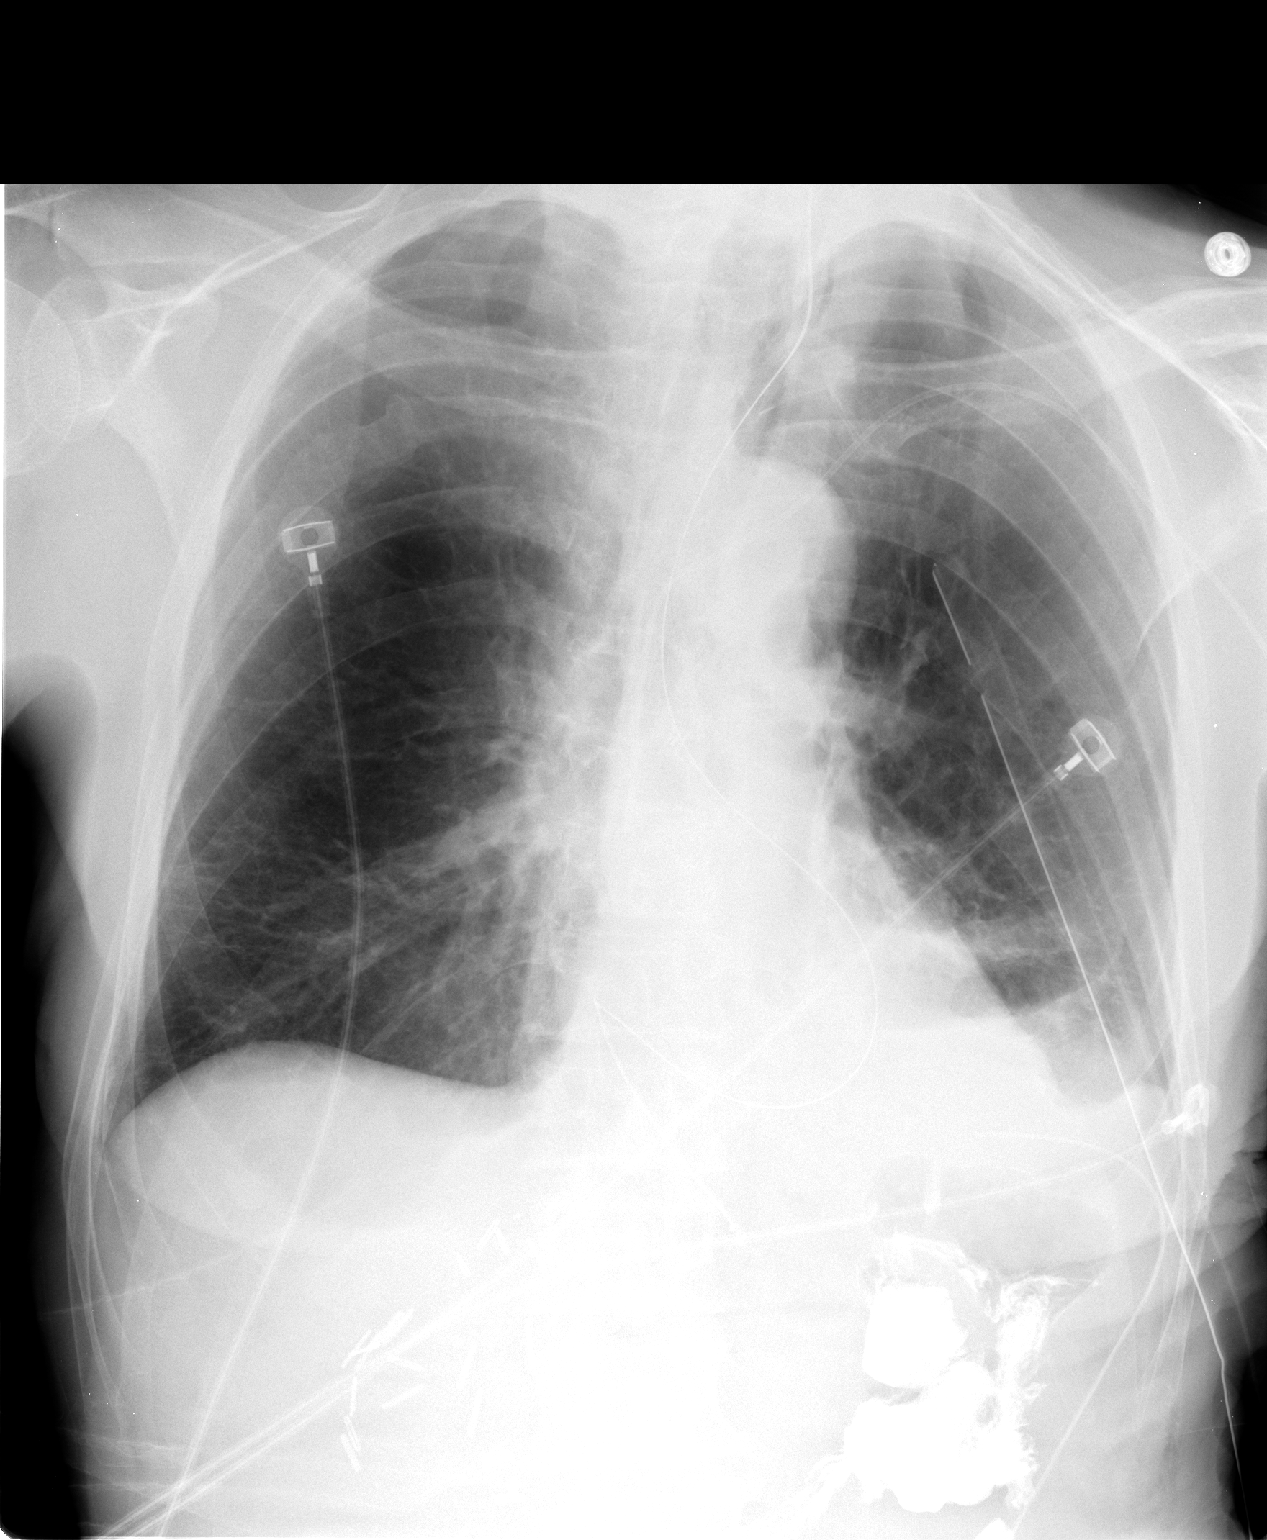

[1 of 1 positions shown; findings below may reference images not displayed]

FINDINGS: An NG tube, left thoracostomy tubes, and left-sided PICC
line are unchanged.
A tiny left basilar pneumothorax is noted and has decreased since
the prior study.
Continued left basilar atelectasis noted.
Cardiomediastinal silhouette is stable.
No other changes are identified.
IMPRESSION: Decreased left basilar pneumothorax, now tiny.

Otherwise stable chest.

## 2010-01-18 ENCOUNTER — Encounter: Admission: RE | Admit: 2010-01-18 | Discharge: 2010-01-18 | Payer: Self-pay | Admitting: Thoracic Surgery

## 2010-01-18 ENCOUNTER — Ambulatory Visit: Payer: Self-pay | Admitting: Thoracic Surgery

## 2010-01-18 IMAGING — CR DG CHEST 1V PORT
1 series · 1 of 1 positions shown · non-contrast
Comparison: 10/31/2009

CLINICAL DATA: Chest tube removal.

PORTABLE CHEST - 1 VIEW

[view not recorded]
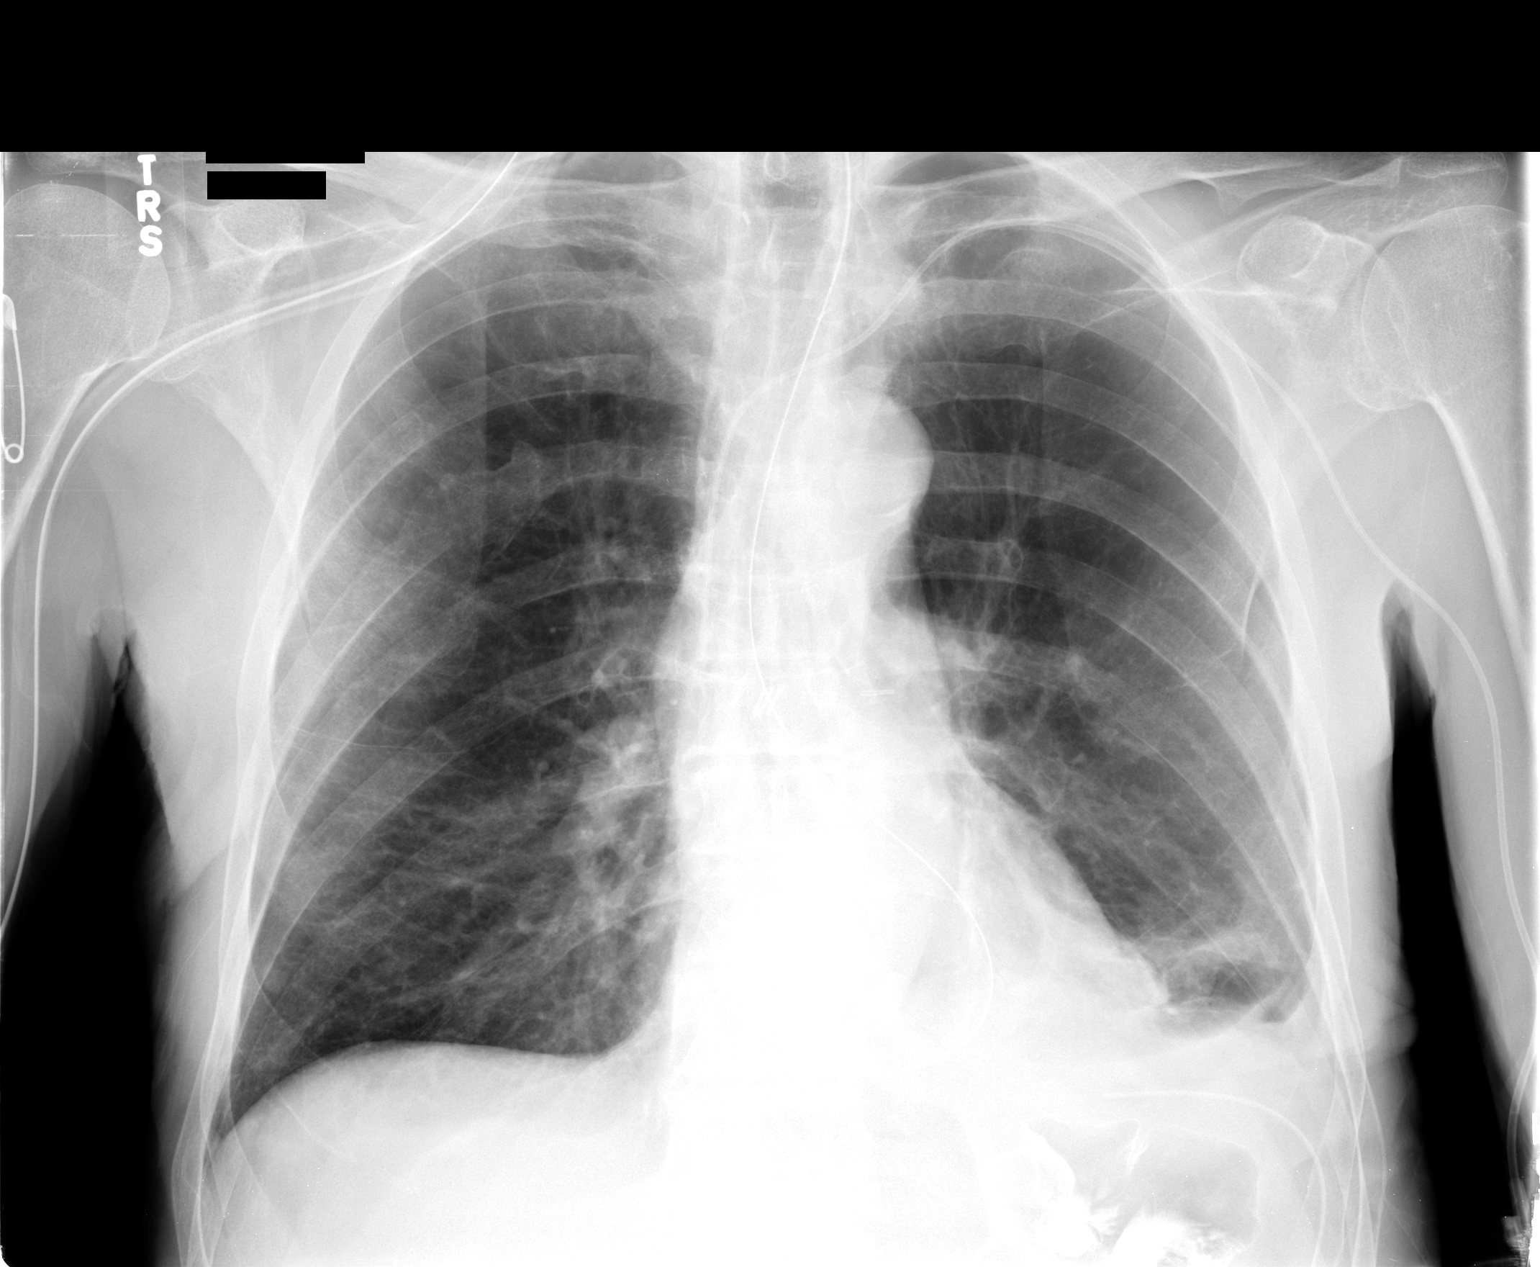

[1 of 1 positions shown; findings below may reference images not displayed]

FINDINGS: Nasogastric tube is followed into the left upper
quadrant.  Left PICC tip projects over the SVC.  Small left apical
pneumothorax after left chest tube removal.  Small left pleural
effusion with left basilar airspace disease, stable.  Emphysema.
Old right rib fracture.
IMPRESSION: 1.  Small left hydropneumothorax after left chest tube removal.
2.  Left basilar airspace disease.

## 2010-01-18 IMAGING — CR DG CHEST 1V PORT
1 series · 1 of 1 positions shown · non-contrast
Comparison: Multiple recent previous exams.

CLINICAL DATA: Small bowel obstruction.  Left diaphragmatic hernia.

PORTABLE CHEST - 1 VIEW

[view not recorded]
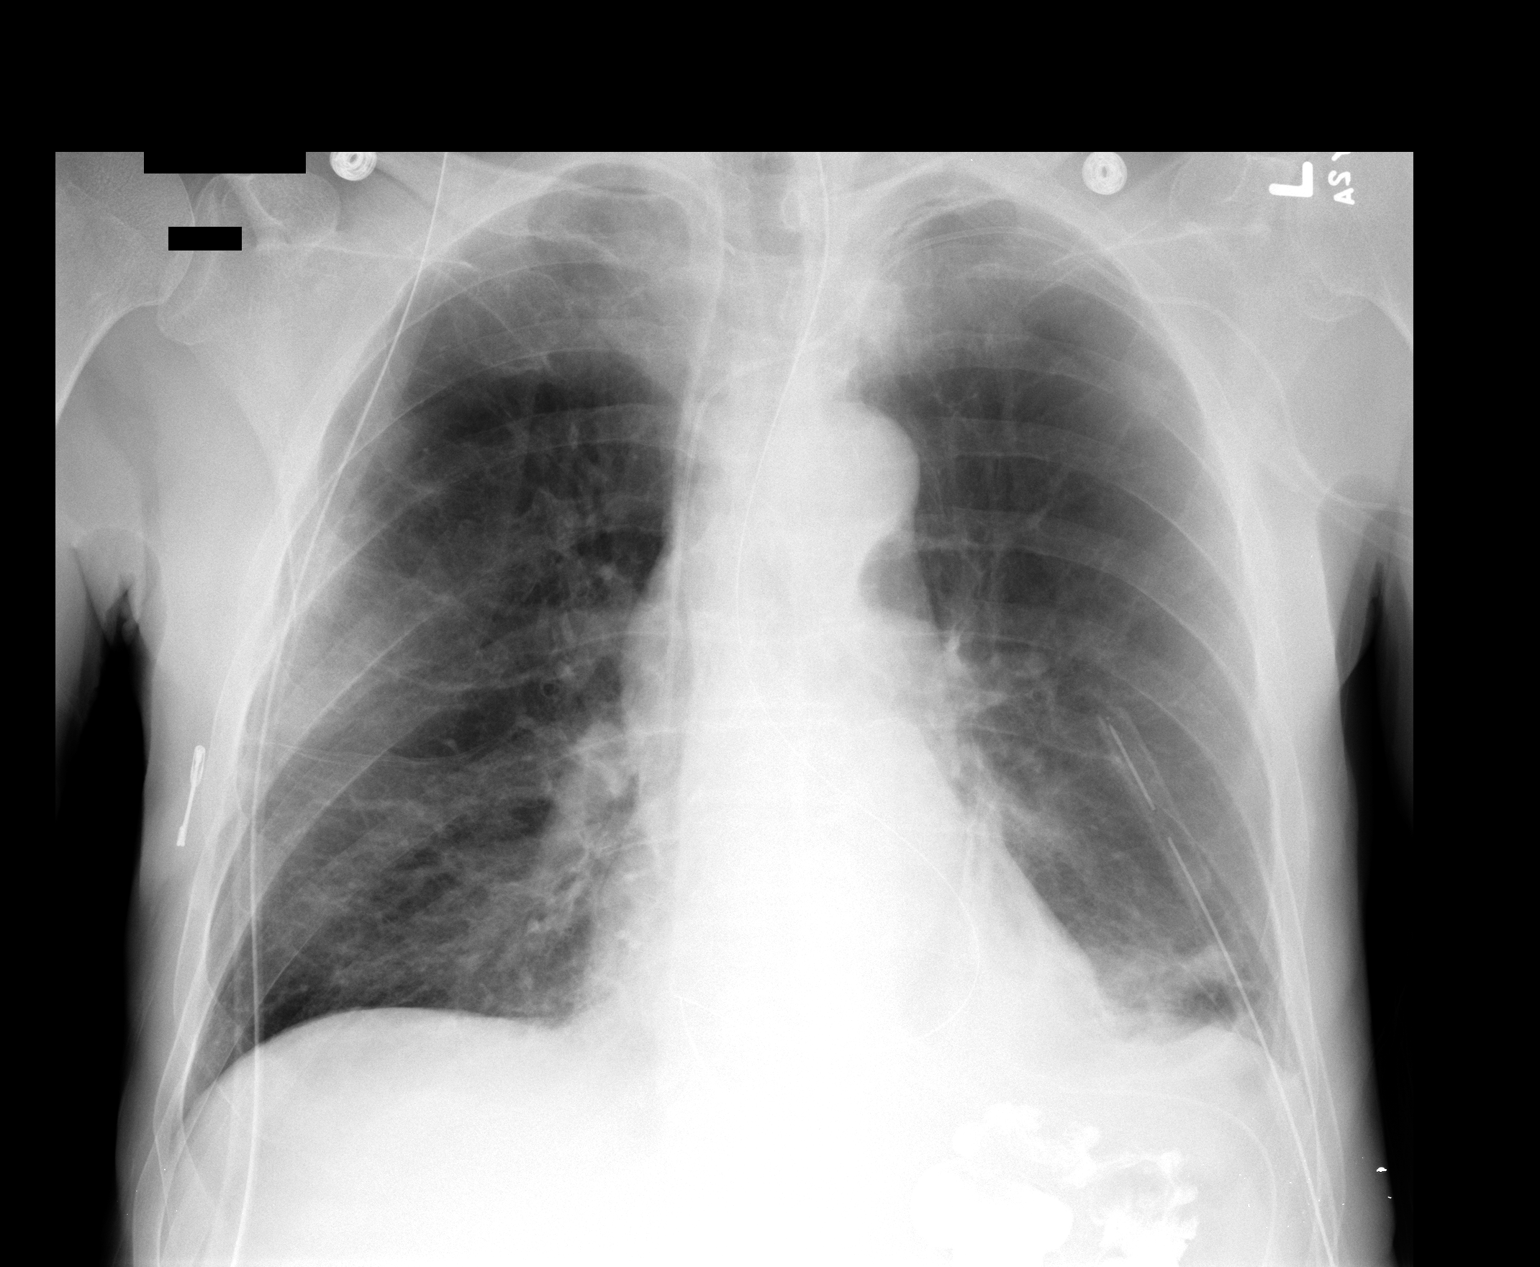

[1 of 1 positions shown; findings below may reference images not displayed]

FINDINGS: [id number] hours.  The two left chest tubes remain in place and
a tiny lucency in the left base may represent a small amount of
anterior pleural air.  The course of the NG tube is stable and may
reflect positioning within a hiatal hernia.  Left PICC line tip
projects in the mid SVC level.  Heart size is normal.  There is
some atelectasis at the left base with improved aeration in the
left lower lobe.  Right lung is clear.
IMPRESSION: Improved aeration at the left lung base.  Question tiny anterior
pneumothorax at the left lung base.

## 2010-01-19 ENCOUNTER — Ambulatory Visit (HOSPITAL_COMMUNITY): Admission: RE | Admit: 2010-01-19 | Discharge: 2010-01-19 | Payer: Self-pay | Admitting: Thoracic Surgery

## 2010-01-19 IMAGING — CR DG CHEST 2V
2 series · 2 of 2 positions shown · non-contrast
Comparison: 10/31/2009.  [DATE] hours.

CLINICAL DATA: Small bowel obstruction.  Left diaphragmatic hernia.
Removal of chest tube.

CHEST - 2 VIEW

[w chest pa]
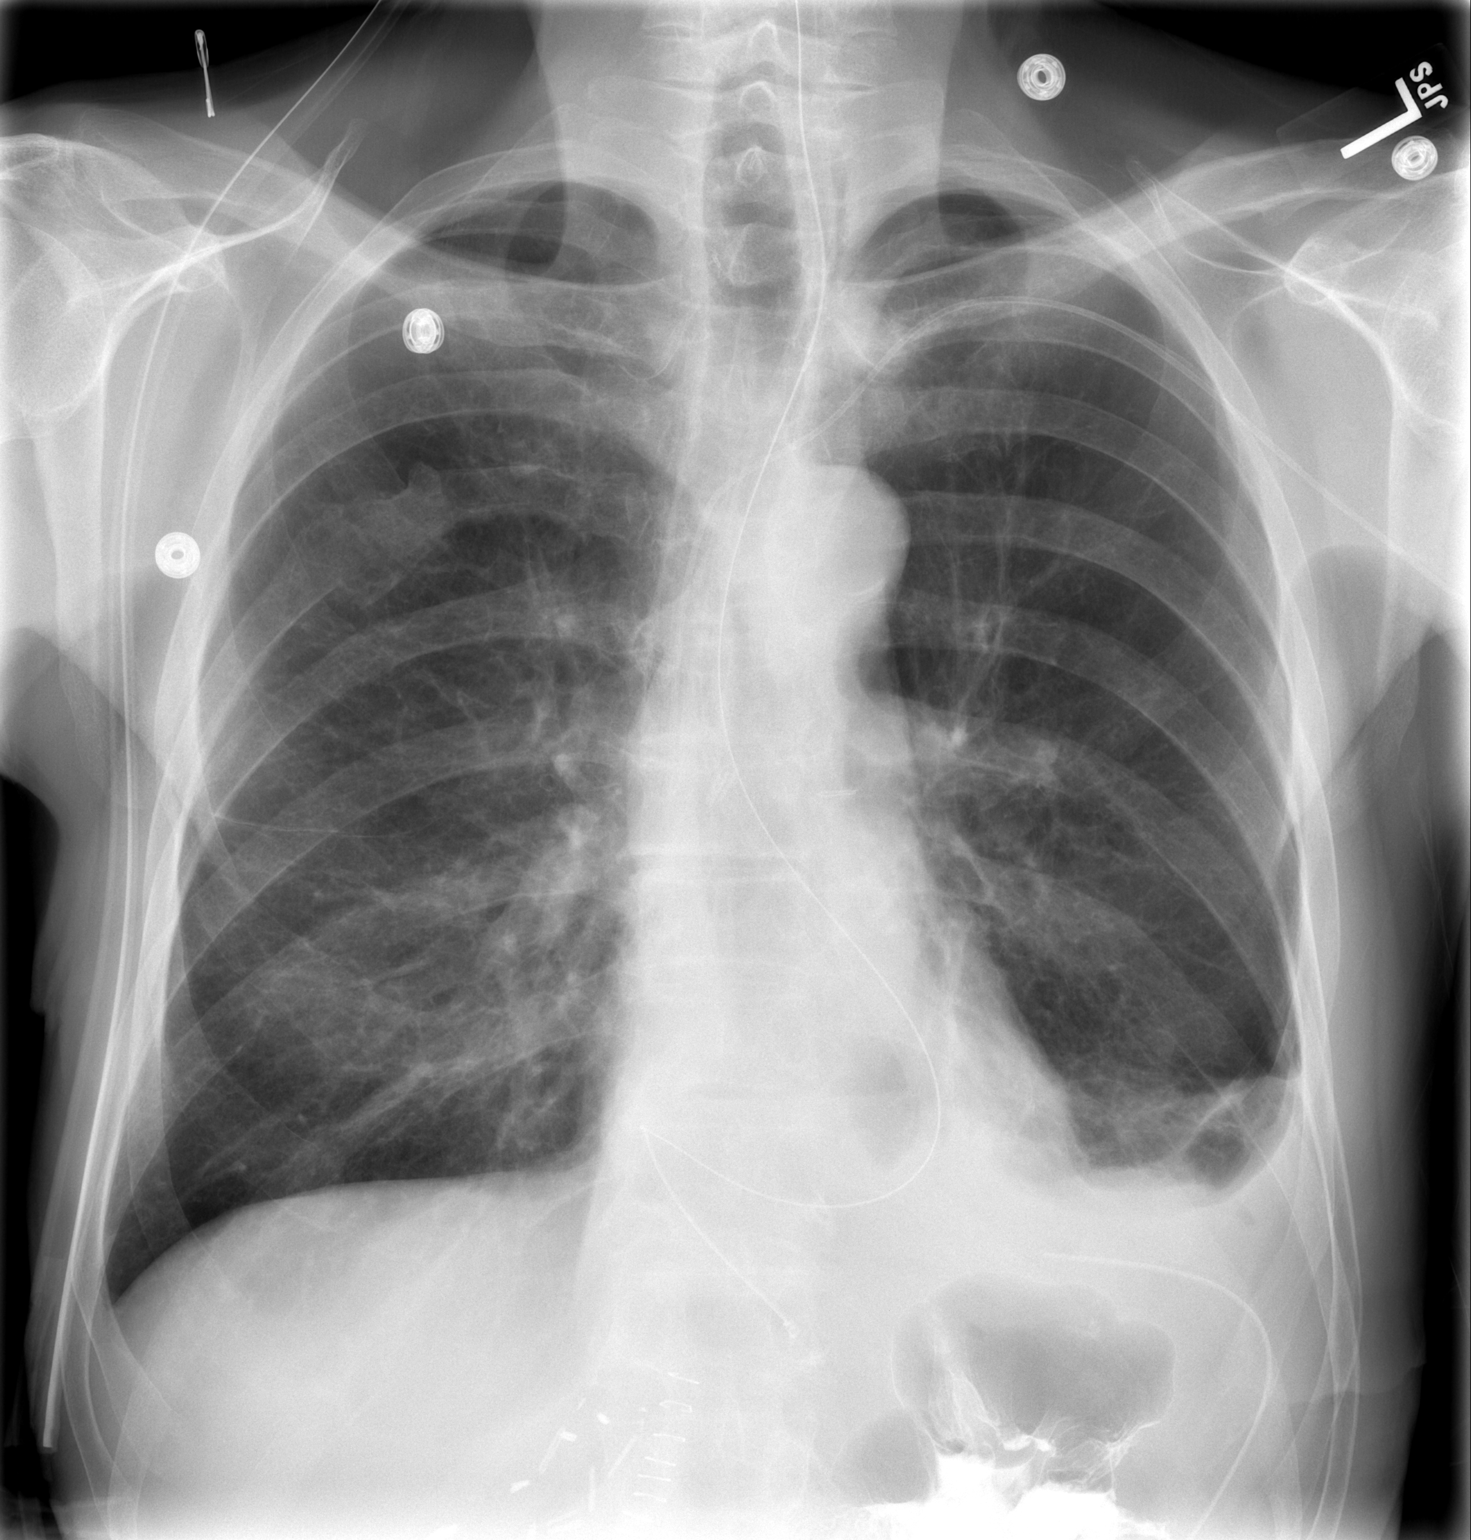

[w chest lat]
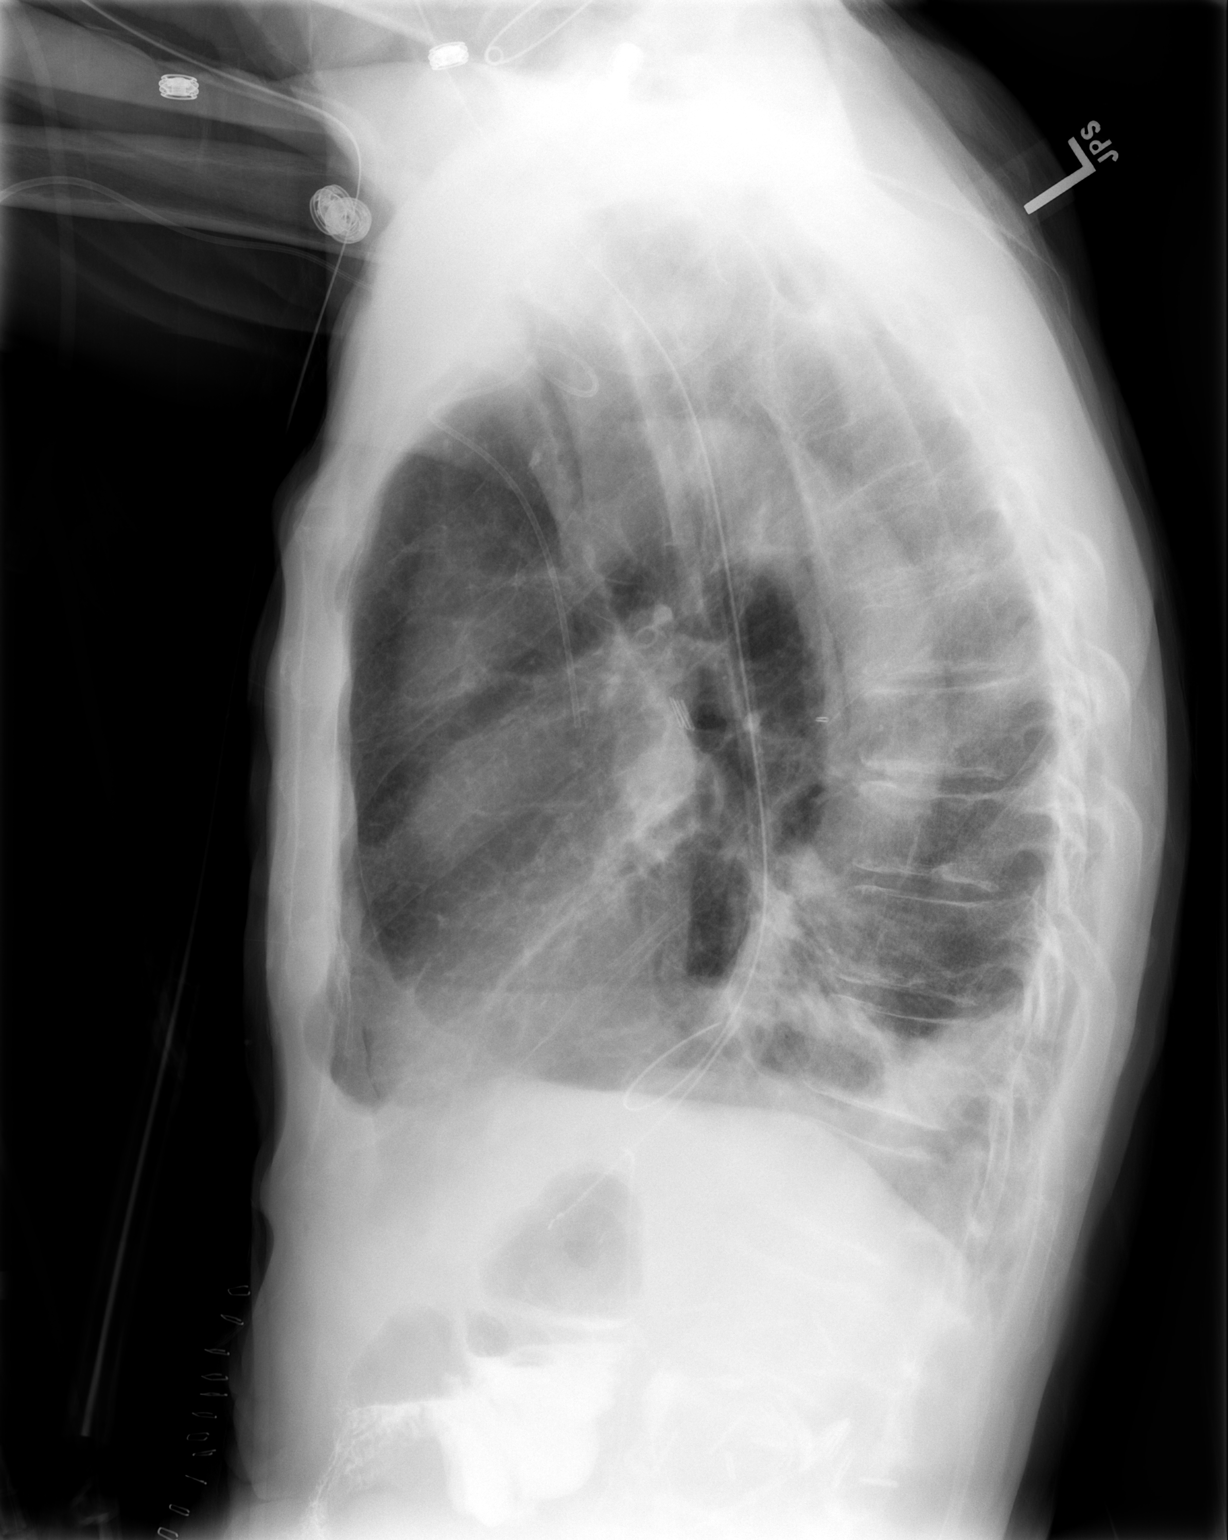

[2 of 2 positions shown; findings below may reference images not displayed]

FINDINGS: The left upper extremity PICC is unchanged.  Nasogastric
tube is present with redundant loop in the inferior chest,
contained within intrathoracic stomach.  The tip of the nasogastric
tube is in the expected location of the cardia of the stomach.
Surgical clips are identified in the right upper quadrant and skin
staples are present over the midline.  Ingested contrast remains
within the right upper quadrant, presumably from swallowing
evaluation.

A pleural line is present at the left apex, the smaller than on
prior examination consistent with hydropneumothorax.  Lateral view
demonstrates left lower lobe airspace opacity in the posterior
basal segment.  Left basilar atelectasis and / or pleural reaction.
IMPRESSION: 1.  Decreasing left apical pneumothorax with unchanged left pleural
fluid and pleural reaction.
2.  Unchanged support apparatus.
3.  Unchanged left lower lobe airspace disease and atelectasis.

## 2010-01-24 ENCOUNTER — Ambulatory Visit: Payer: Self-pay | Admitting: Thoracic Surgery

## 2010-01-27 IMAGING — CR DG CHEST 2V
2 series · 2 of 2 positions shown · non-contrast
Comparison: 11/01/2009

CLINICAL DATA: Postop hiatal hernia repair with chest tube.

CHEST - 2 VIEW

[w chest pa]
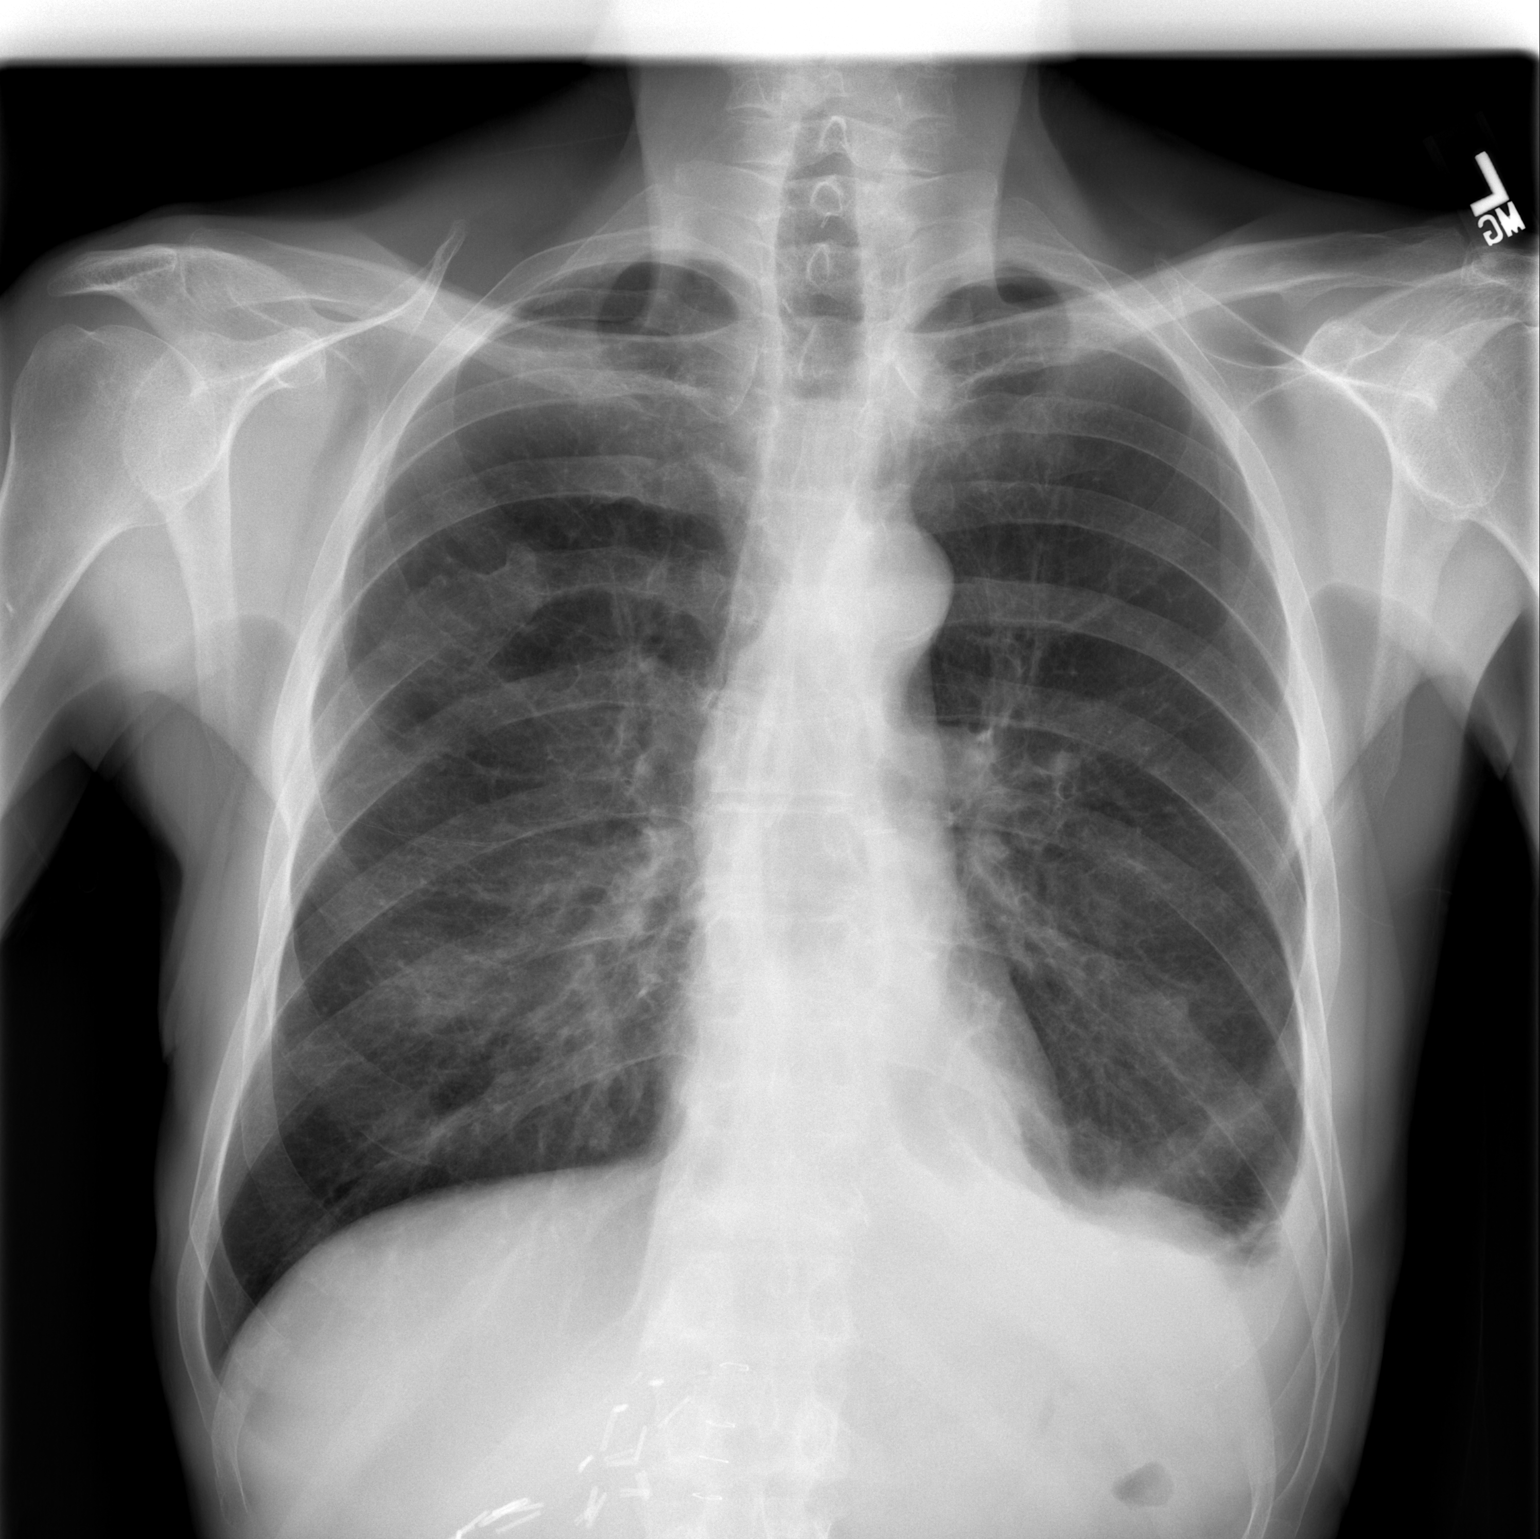

[w chest lat]
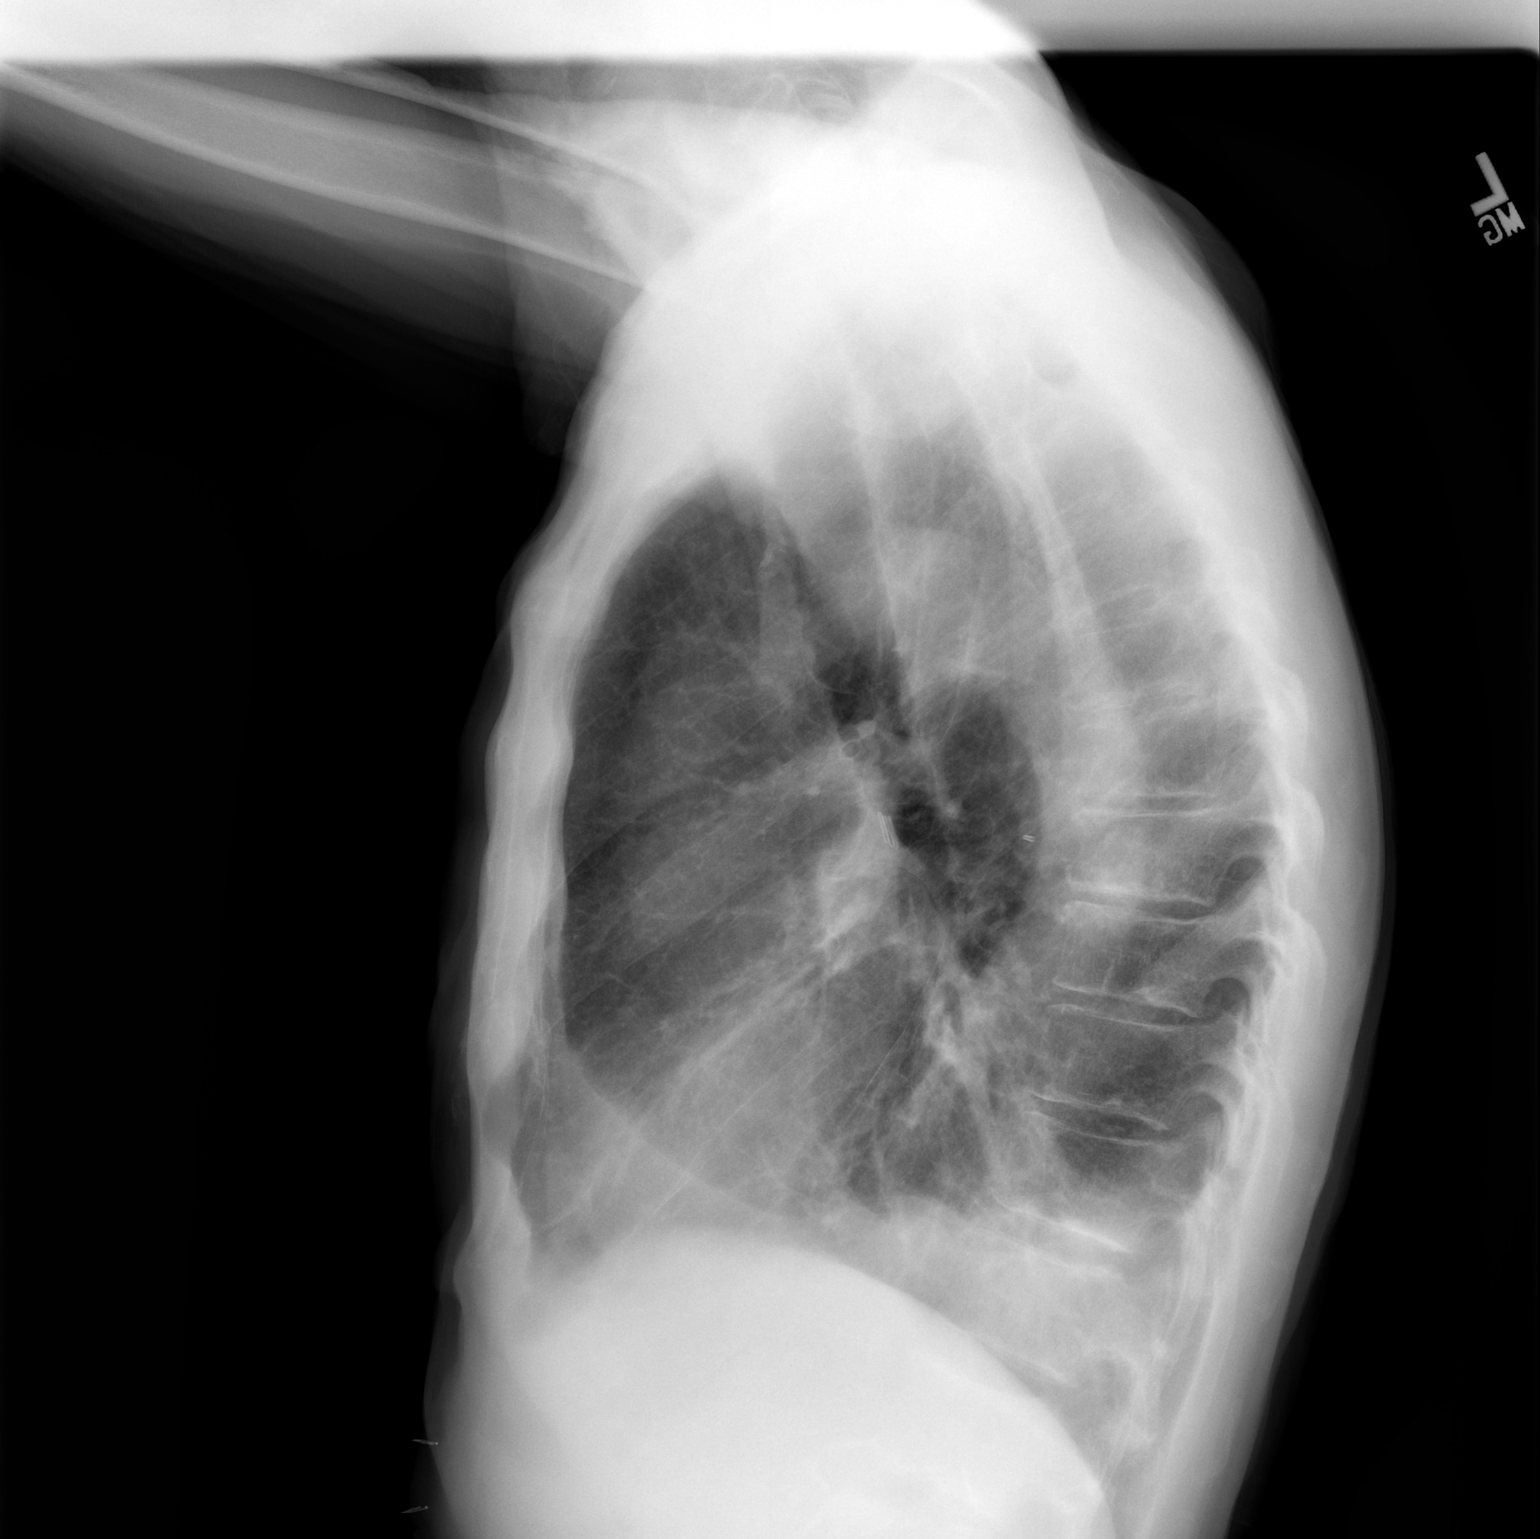

[2 of 2 positions shown; findings below may reference images not displayed]

FINDINGS: Since prior exam the PICC line, NG tube, and left upper
quadrant drainage tube have been removed.

There is a small left pleural effusion minimally less in degree
than noted previously.  There is minimal atelectasis at the left
base.  No pneumothorax.  There is deformity of the posterior aspect
of the right sixth rib which may represent a prior thoracotomy
site.  Lungs appear hyperaerated.
IMPRESSION: Minimal left pleural effusion.  Minimal atelectasis at the left
base.  No pneumothorax.  Suspicion for COPD.

## 2010-02-01 ENCOUNTER — Ambulatory Visit (HOSPITAL_COMMUNITY): Admission: RE | Admit: 2010-02-01 | Discharge: 2010-02-01 | Payer: Self-pay | Admitting: Thoracic Surgery

## 2010-02-07 ENCOUNTER — Ambulatory Visit: Payer: Self-pay | Admitting: Thoracic Surgery

## 2010-02-24 ENCOUNTER — Inpatient Hospital Stay (HOSPITAL_COMMUNITY): Admission: RE | Admit: 2010-02-24 | Discharge: 2010-03-01 | Payer: Self-pay | Admitting: Thoracic Surgery

## 2010-02-24 ENCOUNTER — Encounter: Payer: Self-pay | Admitting: Thoracic Surgery

## 2010-02-24 ENCOUNTER — Ambulatory Visit: Payer: Self-pay | Admitting: Thoracic Surgery

## 2010-02-24 HISTORY — PX: OTHER SURGICAL HISTORY: SHX169

## 2010-03-10 ENCOUNTER — Ambulatory Visit: Payer: Self-pay | Admitting: Thoracic Surgery

## 2010-03-10 ENCOUNTER — Ambulatory Visit: Admission: RE | Admit: 2010-03-10 | Discharge: 2010-05-19 | Payer: Self-pay | Admitting: Radiation Oncology

## 2010-03-10 ENCOUNTER — Encounter: Admission: RE | Admit: 2010-03-10 | Discharge: 2010-03-10 | Payer: Self-pay | Admitting: Thoracic Surgery

## 2010-03-24 ENCOUNTER — Ambulatory Visit: Payer: Self-pay | Admitting: Thoracic Surgery

## 2010-03-29 IMAGING — CR DG CHEST 2V
2 series · 2 of 2 positions shown · non-contrast
Comparison: Chest radiograph 11/09/2009

CLINICAL DATA: Hiatal hernia, pneumonia, esophageal cancer

CHEST - 2 VIEW

[w chest pa]
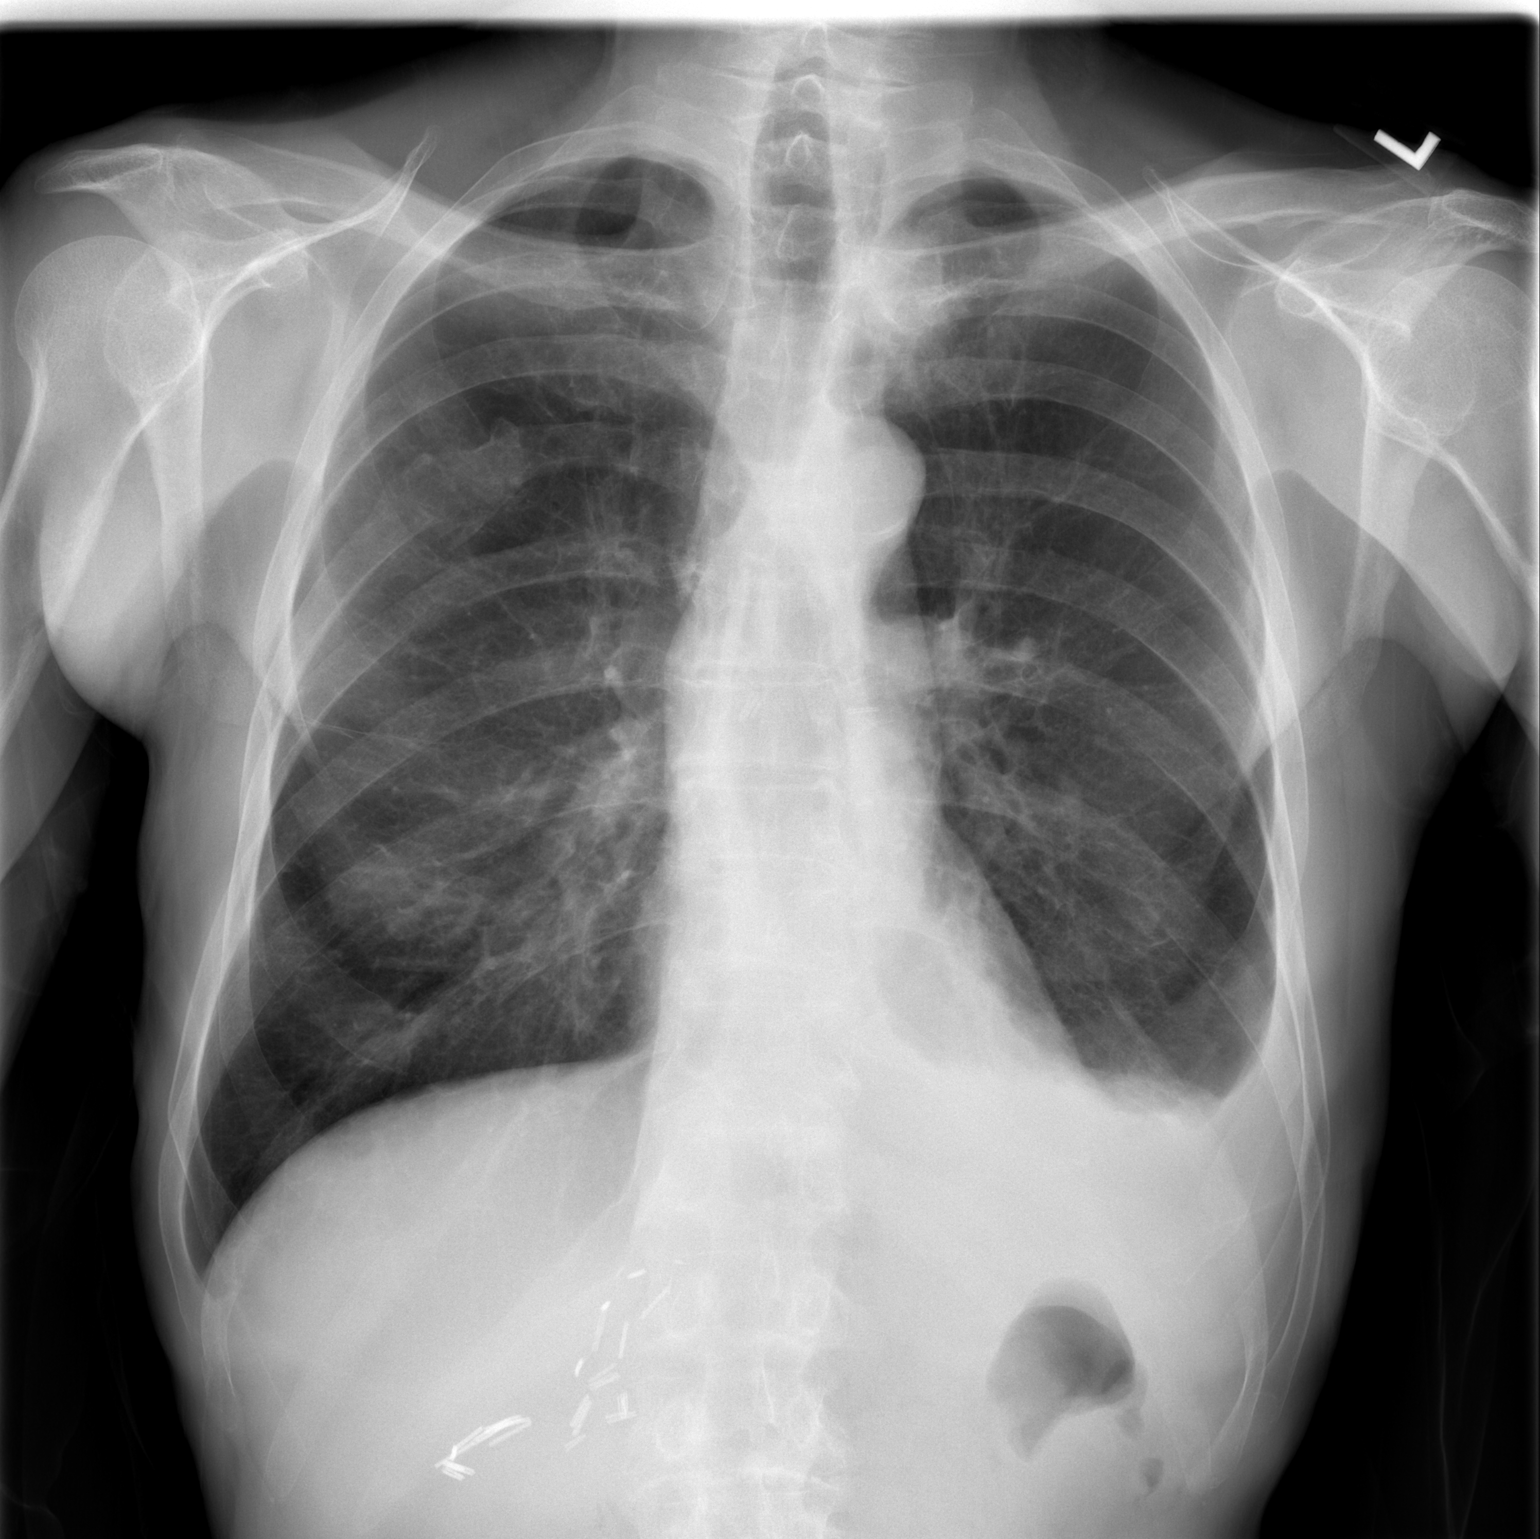

[w chest lat]
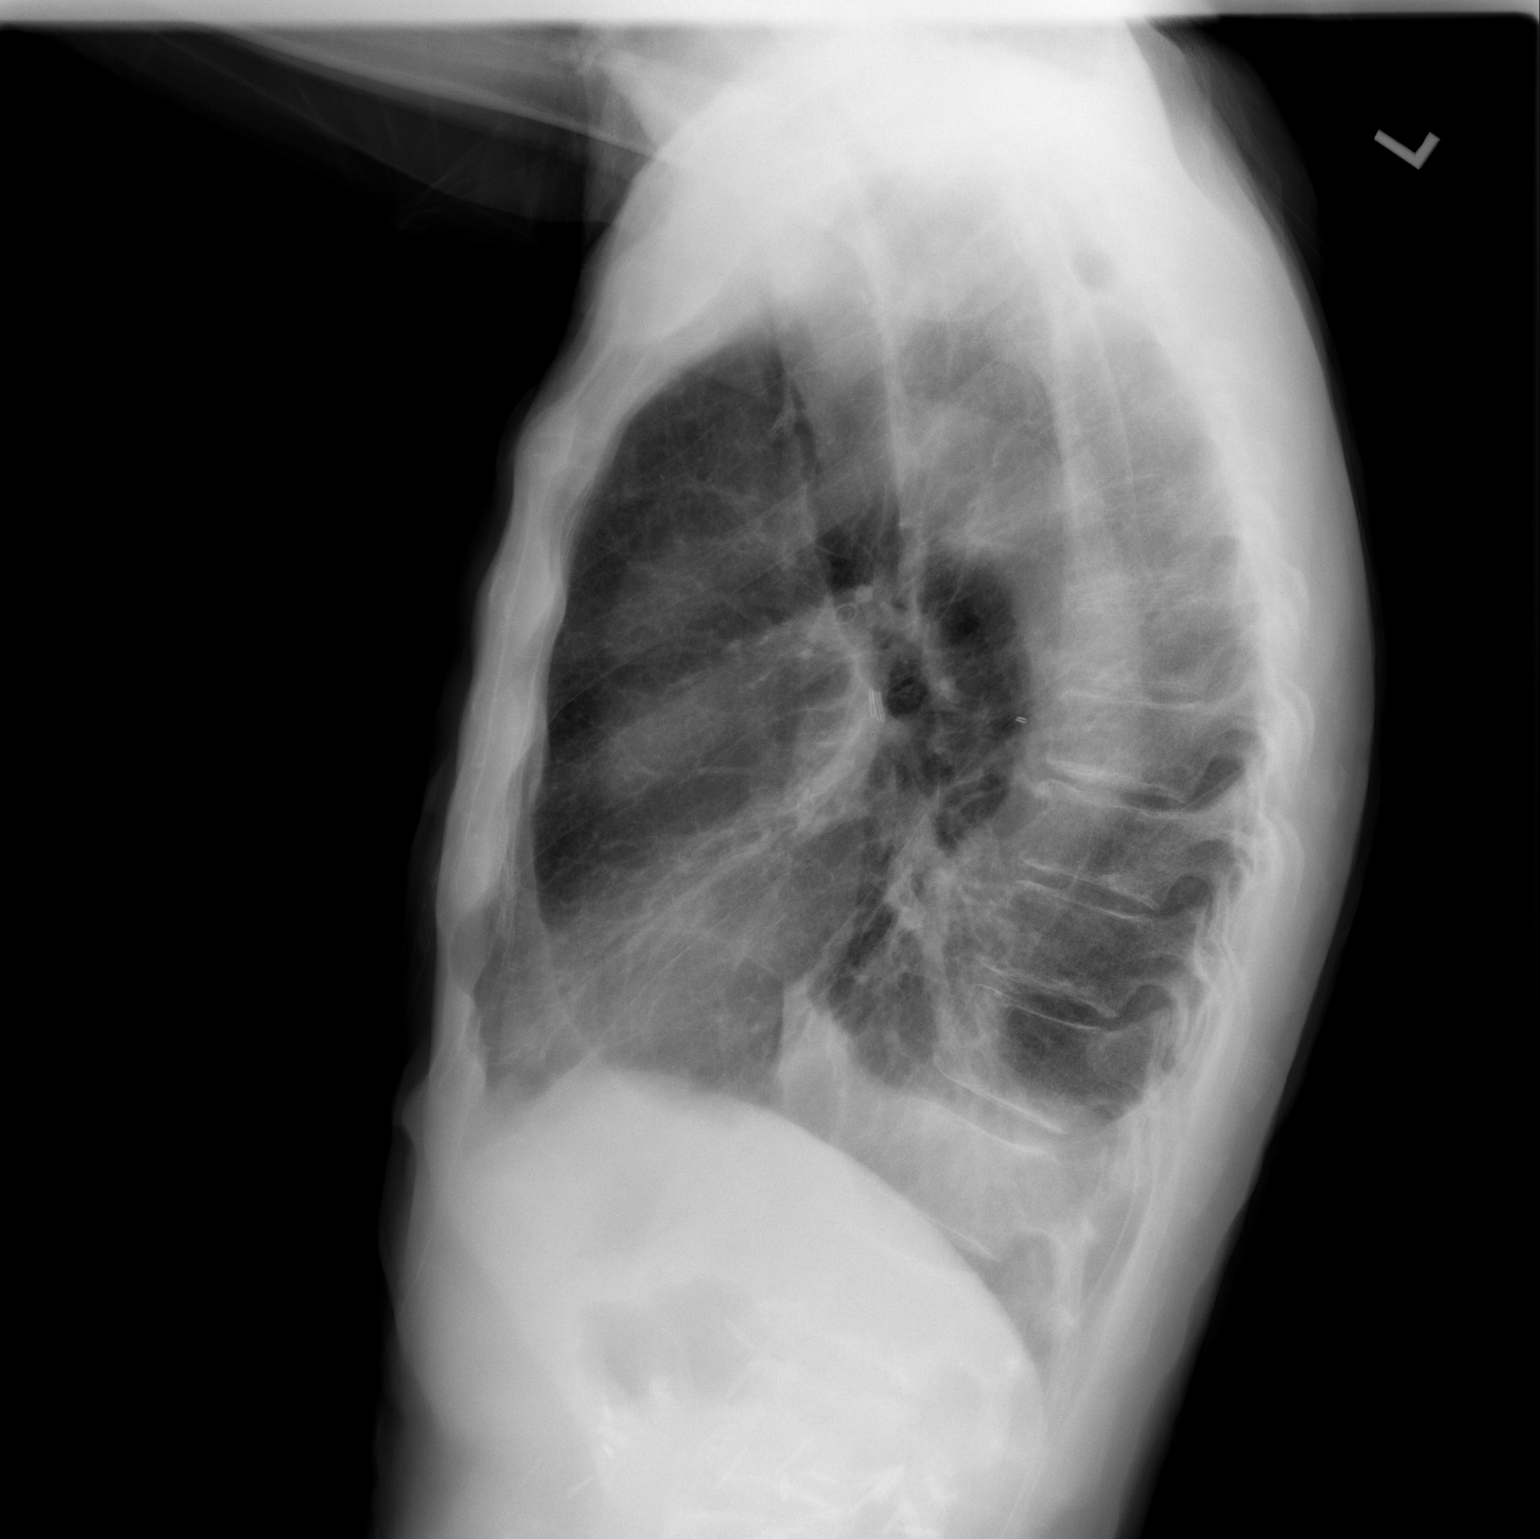

[2 of 2 positions shown; findings below may reference images not displayed]

FINDINGS: Normal mediastinum and cardiac silhouette.  There is a
small chronic left pleural effusion which is similar in volume to
slightly increased compared to prior.  Right lung bases clear.
Lungs are hyperinflated with chronic bronchitic markings.
Posterior right rib deformities again noted.  No pulmonary edema or
consolidation.
IMPRESSION: 1.  Chronic left pleural effusion is similar to prior.
2.  Emphysematous change

## 2010-04-07 IMAGING — CT CT CHEST W/O CM
3 of 4 series · 17 of 30 positions shown, 19 images · non-contrast
Comparison: [DATE] at 10

CLINICAL DATA: Lung lesion.

CT CHEST WITHOUT CONTRAST
TECHNIQUE: Multidetector CT imaging of the chest was performed
following the standard protocol without IV contrast.

[Series 3: routine chest · axial · 0.70mm/px · z∈[-268,-48]mm · 5 of 68 slices shown, 7 images]
[im 12/68  mediastinal]
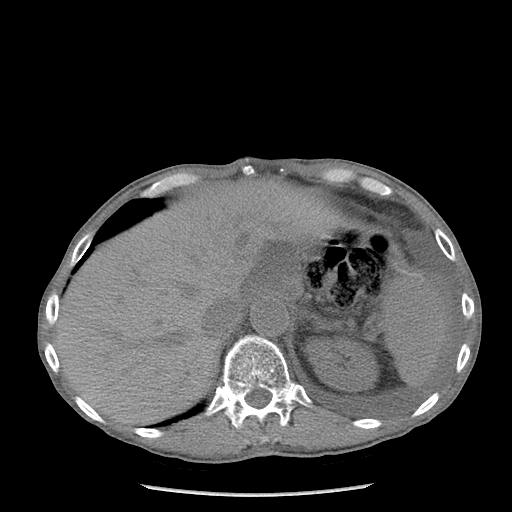
[im 12/68  lung]
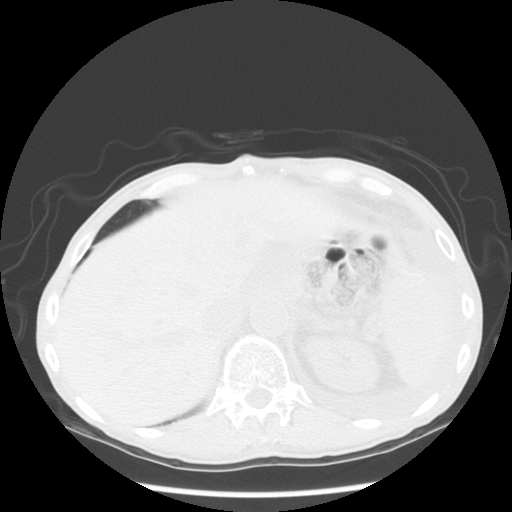
[im 23/68  lung]
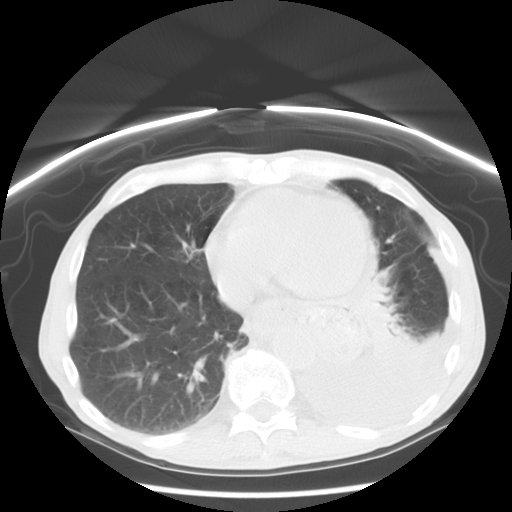
[im 34/68  lung]
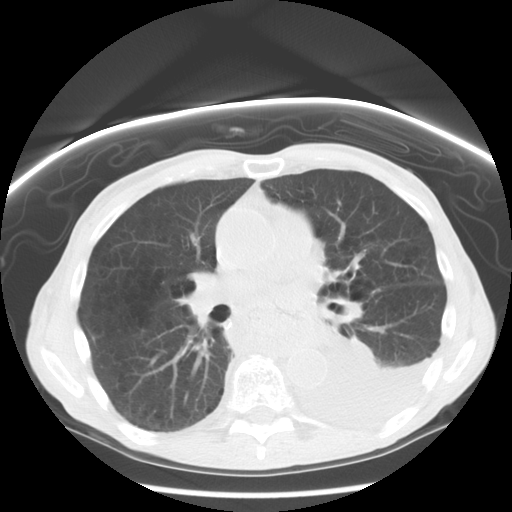
[im 45/68  lung]
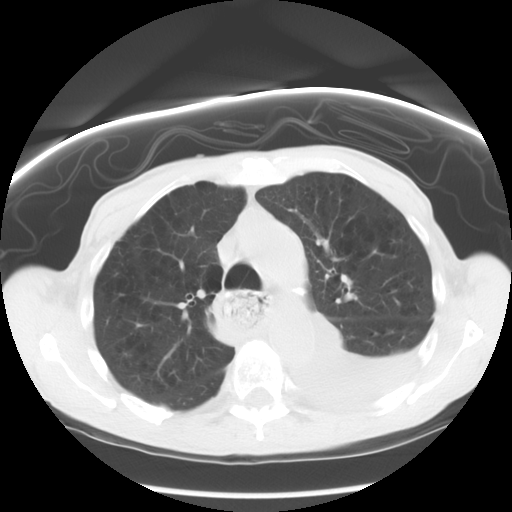
[im 56/68  mediastinal]
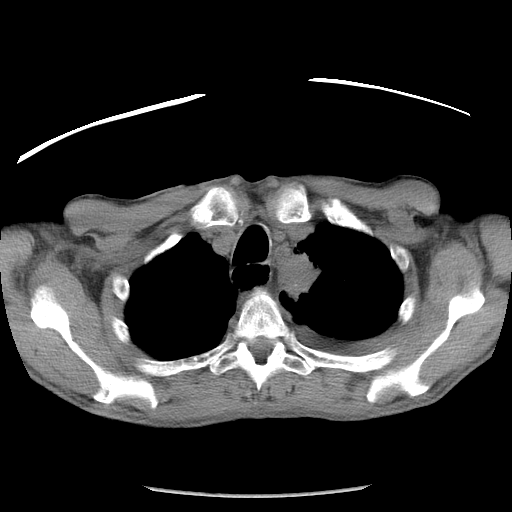
[im 56/68  lung]
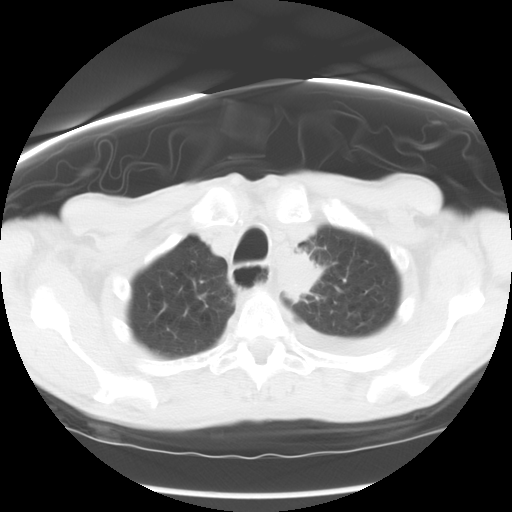

[Series 4: lung windows · axial · 0.70mm/px · z∈[-223,-48]mm · 4 of 59 slices shown]
[im 12/59  lung]
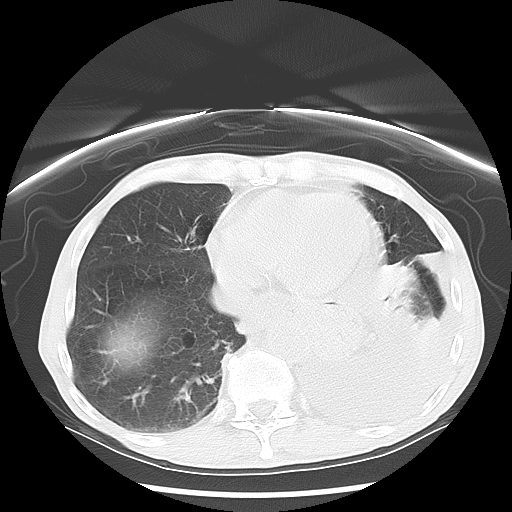
[im 24/59  lung]
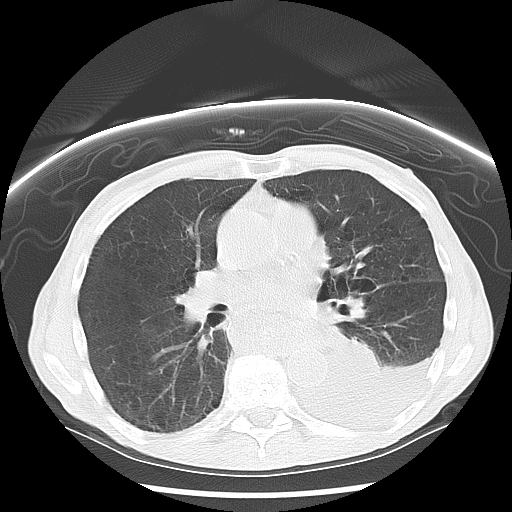
[im 35/59  lung]
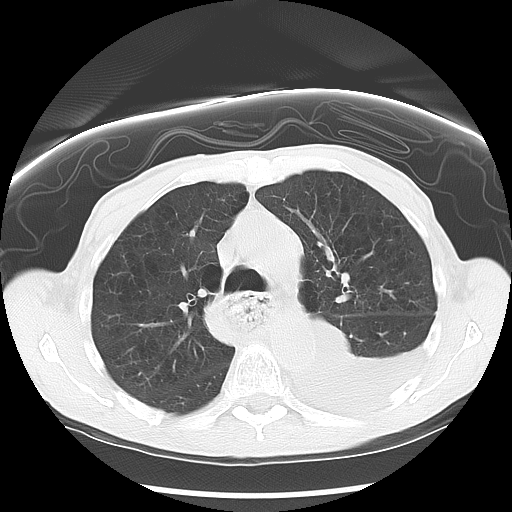
[im 47/59  lung]
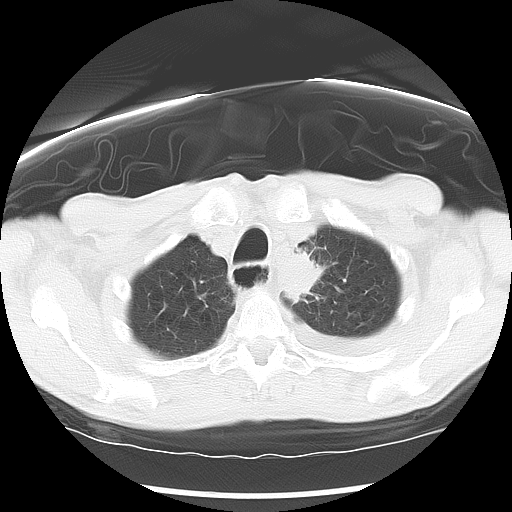

[Series 602: sagittal body · sagittal · 0.70mm/px · 8 of 145 slices shown]
[im 11/145  mediastinal]
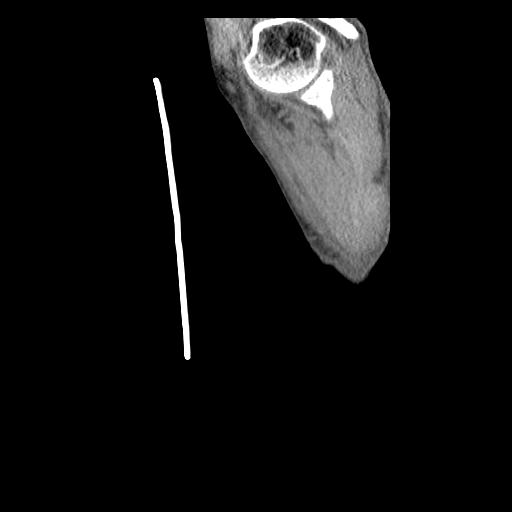
[im 31/145  mediastinal]
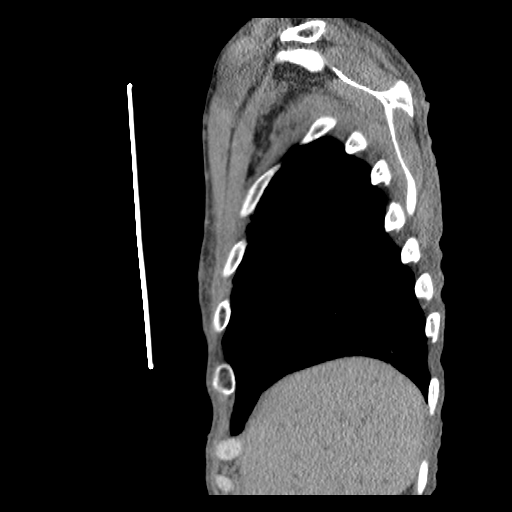
[im 52/145  mediastinal]
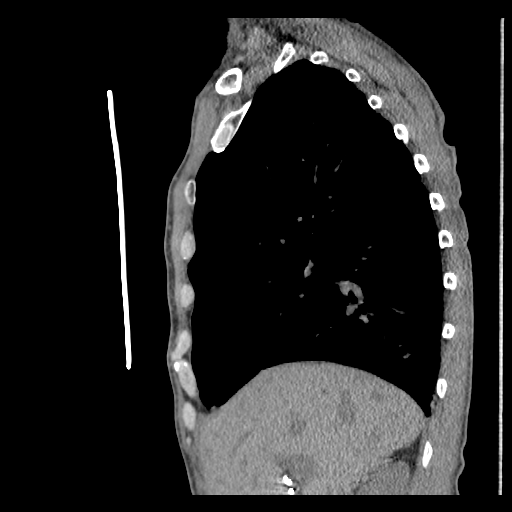
[im 62/145  mediastinal]
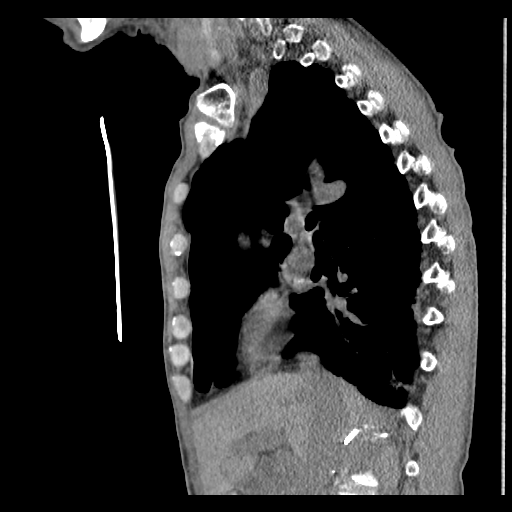
[im 83/145  mediastinal]
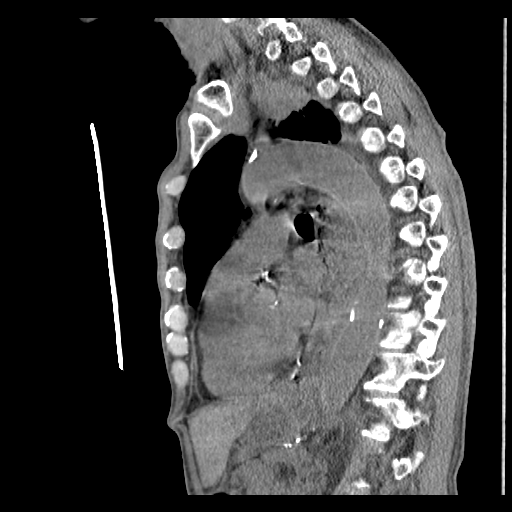
[im 93/145  mediastinal]
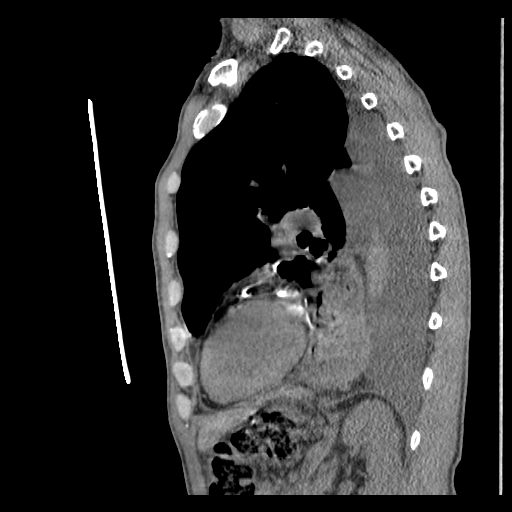
[im 114/145  mediastinal]
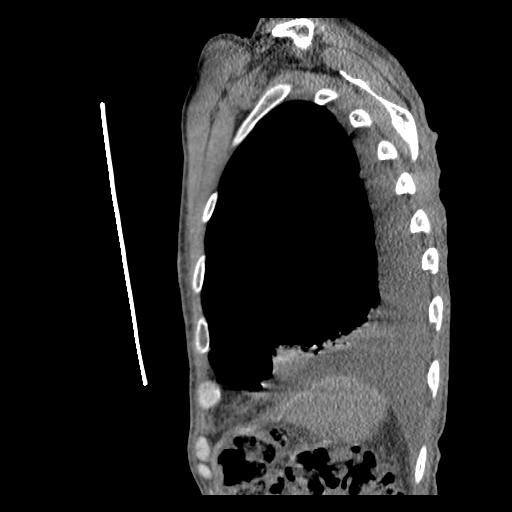
[im 134/145  mediastinal]
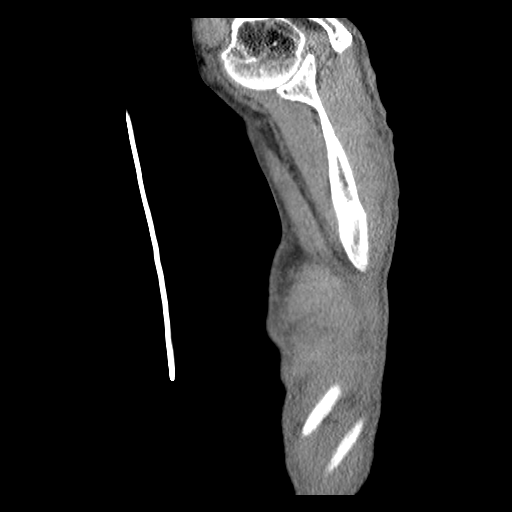

[17 of 30 positions shown; findings below may reference images not displayed]

FINDINGS: There is a new medial left upper lobe mass.  This is
adjacent to medial left upper lobe blebs and measures 3.1 x 2.6 cm.
This was not present on prior chest CT.  This abuts the medial
pleural surface.  Associated moderate to large left pleural
effusion.  Emphysematous changes throughout the lungs.

No mediastinal, hilar or axillary adenopathy.  Prior esophagectomy
with gastric pull-through. Visualized thyroid and chest wall soft
tissues unremarkable.

Imaging into the upper abdomen shows no acute findings.  Prior
cholecystectomy.  Surgical clips seen in the right retroperitoneum
superior and anterior to the right kidney.

No acute bony abnormality.  Deformity of a posterior right rib
noted, likely postoperative.

Heart is normal size.  Aorta is normal caliber.  Dense coronary
artery calcifications present.
IMPRESSION: New medial left upper lobe mass abutting the medial pleural
surface, measuring up to 3.2 cm.  This is concerning for lung
cancer.  This can be further evaluated with PET CT.  Moderate to
large associated left pleural effusion.

COPD.

Esophagectomy with gastric pull-through.

Coronary artery disease.

## 2010-04-08 IMAGING — PT NM PET TUM IMG INITIAL (PI) SKULL BASE T - THIGH
8 series · 25 of 25 positions shown · non-contrast
Comparison: None.

CLINICAL DATA: Initial treatment strategy for left lung mass.
History of esophageal carcinoma.

NUCLEAR MEDICINE PET CT INITIAL (PI) SKULL BASE TO THIGH
TECHNIQUE: 16.2 mCi F-18 FDG was injected intravenously via the
left arm.  Full-ring PET imaging was performed from the skull base
through the mid-thighs 60  minutes after injection.  CT data was
obtained and used for attenuation correction and anatomic
localization only.  (This was not acquired as a diagnostic CT
examination.)
Fasting Blood Glucose:  90

[Series 1: pet ac · axial · 3.3mm · 4.69mm/px · z∈[-870,+0]mm · 5 of 267 slices shown]
[im 1/267]
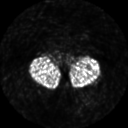
[im 67/267]
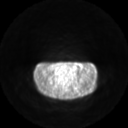
[im 134/267]
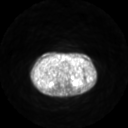
[im 200/267]
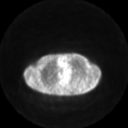
[im 267/267]
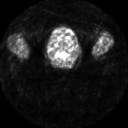

[Series 2: ct images · axial · 3.8mm · 0.98mm/px · z∈[-870,+0]mm · 5 of 267 slices shown]
[im 1/267]
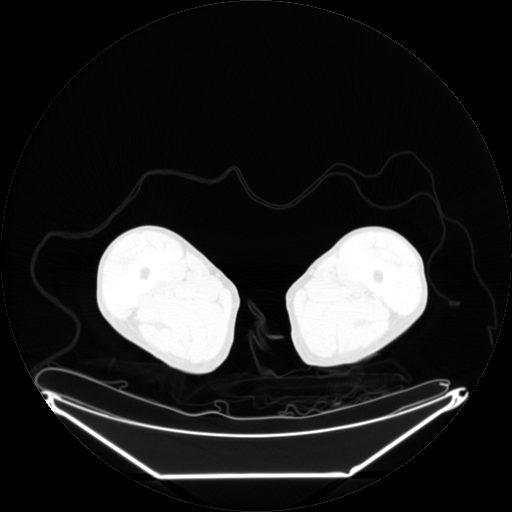
[im 67/267]
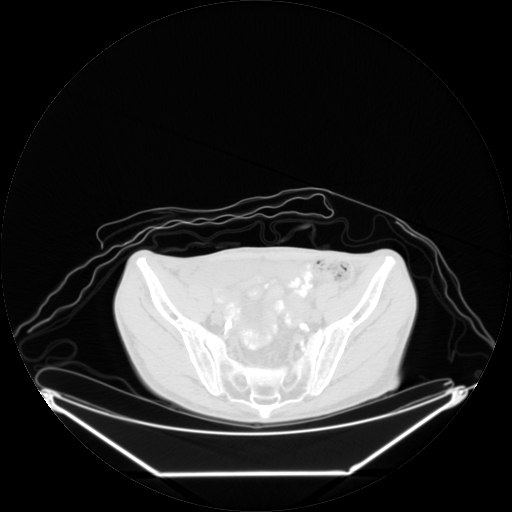
[im 134/267]
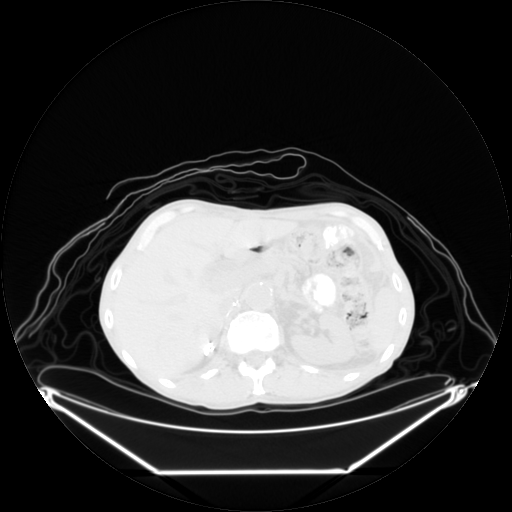
[im 200/267]
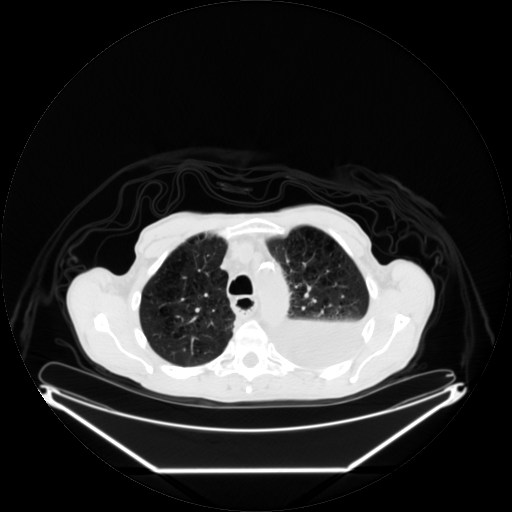
[im 267/267]
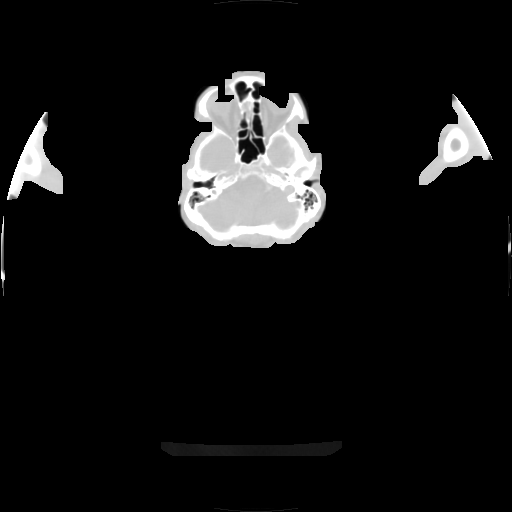

[Series 2: pet nac · axial · 3.3mm · 4.69mm/px · z∈[-870,+0]mm · 5 of 267 slices shown]
[im 1/267]
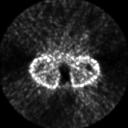
[im 67/267]
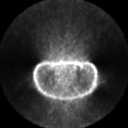
[im 134/267]
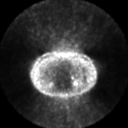
[im 200/267]
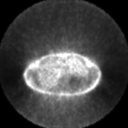
[im 267/267]
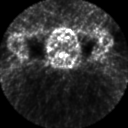

[Series 123: mip · coronal · 3.3mm · 4.69mm/px · 1 of 30 slices shown]
[im 1/30]
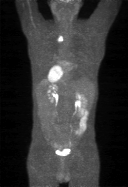

[Series 150: reformatted · coronal · 3.3mm · 1.75mm/px · 1 of 2 slices shown (1 of 4)]
[im 1/2]
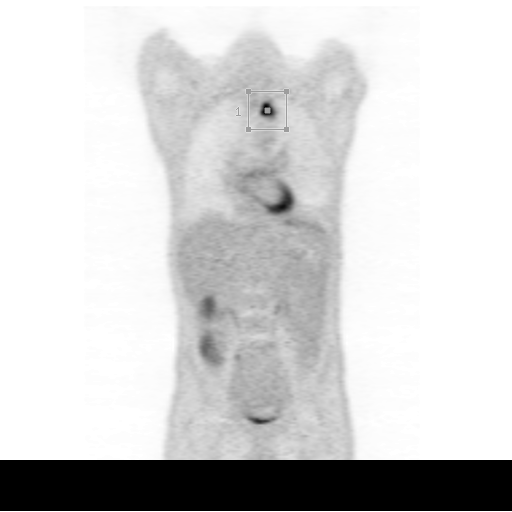

[Series 151: reformatted · axial · 3.3mm · 3.91mm/px · z∈[-870,+0]mm · 5 of 267 slices shown (2 of 4)]
[im 1/267]
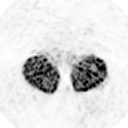
[im 67/267]
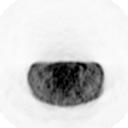
[im 134/267]
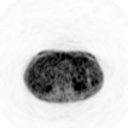
[im 200/267]
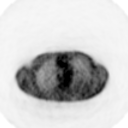
[im 267/267]
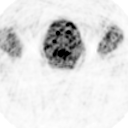

[Series 153: reformatted · coronal · 4.7mm · 6.98mm/px · 2 of 78 slices shown (3 of 4)]
[im 1/78]
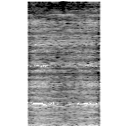
[im 78/78]
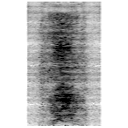

[Series 250: reformatted · axial · 3.8mm · 0.98mm/px · 1 of 1 slices shown (4 of 4)]
[im 1/1]
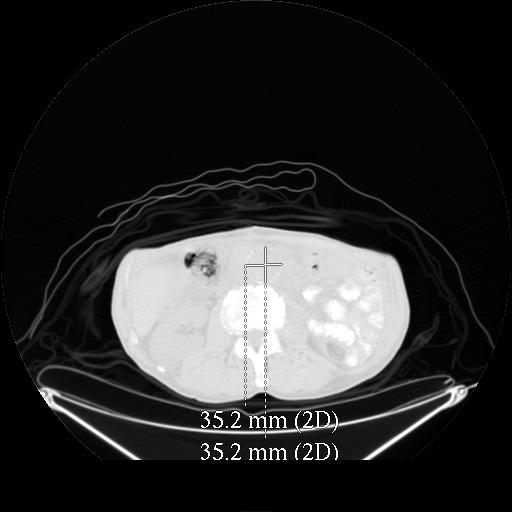

[25 of 25 positions shown; findings below may reference images not displayed]

FINDINGS: Intense hypermetabolic activity is seen throughout the 3
cm mass in the medial left upper lobe abutting the mediastinum.
This has a maximum SUV of 22.3.

No hypermetabolic mediastinal or hilar lymph nodes are seen.
Hypermetabolic activity is seen along the left hemidiaphragm with
associated moderate left pleural effusion.  This is suspicious for
a malignant pleural effusion.

No hypermetabolic masses or adenopathy are seen within the neck.
In the abdomen and pelvis there is a hypermetabolic activity seen
throughout the majority of the descending colon.  Corresponding
right colonic wall thickening is seen.  The length of involvement
is most consistent with colitis, with the colon carcinoma
considered much less likely.

In addition, noncontrast CT images show an infrarenal abdominal
aortic aneurysm measuring 3.5 centimeters in diameter.
IMPRESSION: 1.  Hypermetabolic mass in the medial left upper lobe abutting the
mediastinum, consistent with malignancy.
2.  Moderate left pleural effusion with hypermetabolic activity
along the pleural surface of the left hemidiaphragm, suspicious for
malignant pleural effusion.
3.  Hypermetabolic uptake involving long segment of the ascending
colon, with corresponding wall thickening seen on CT.  The length
of involvement strongly favors right-sided colitis, with colon
carcinoma considered much less likely.
4.  3.5 cm infrarenal abdominal aortic aneurysm incidentally noted.

## 2010-04-21 ENCOUNTER — Ambulatory Visit: Payer: Self-pay | Admitting: Thoracic Surgery

## 2010-04-21 ENCOUNTER — Encounter: Admission: RE | Admit: 2010-04-21 | Discharge: 2010-04-21 | Payer: Self-pay | Admitting: Thoracic Surgery

## 2010-04-21 IMAGING — CR DG CHEST 1V
1 series · 1 of 1 positions shown · non-contrast
Comparison: Chest x-ray 01/09/2010

CLINICAL DATA: CT guided lung biopsy.

CHEST - 1 VIEW

[w chest pa]
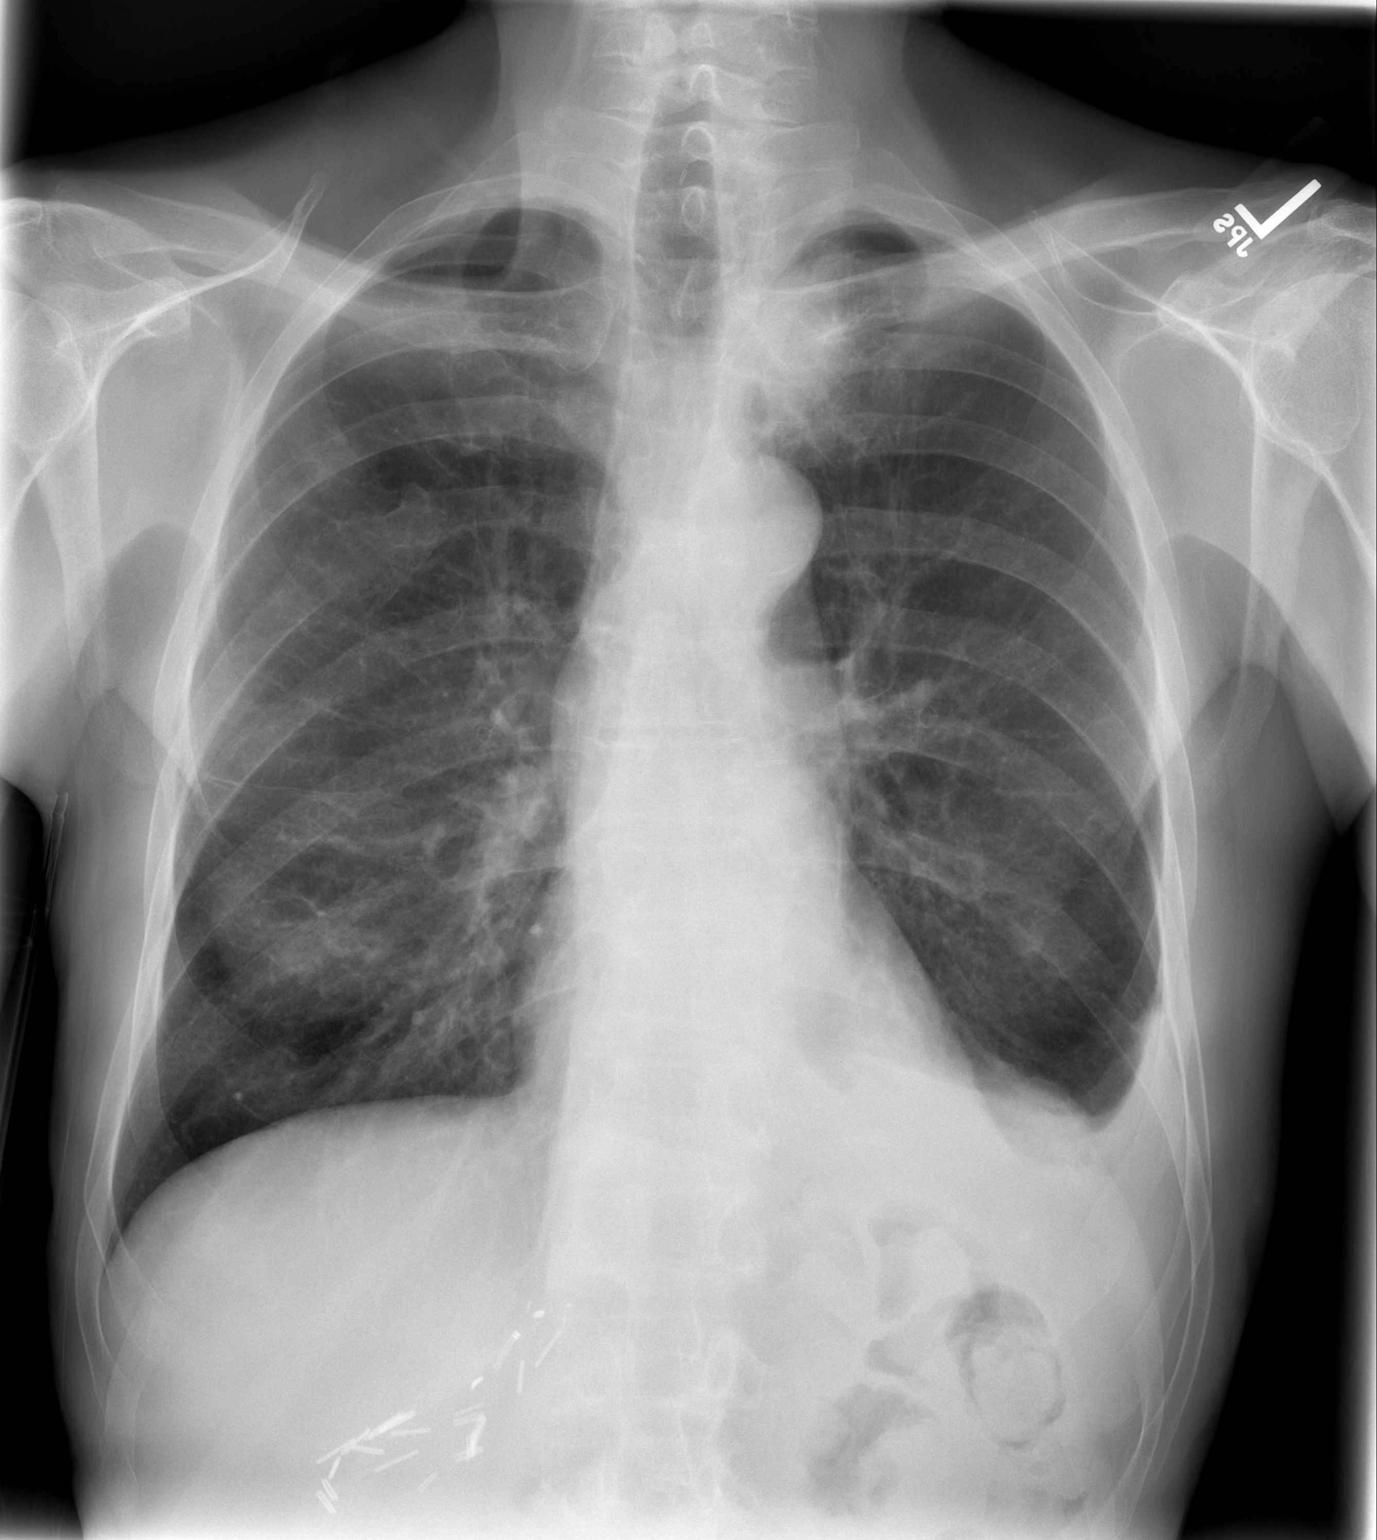

[1 of 1 positions shown; findings below may reference images not displayed]

FINDINGS: The cardiac silhouette, mediastinal and hilar contours
are stable.  No definite pneumothorax is seen post left upper lobe
lung biopsy.  There is a persistent small left pleural effusion.
Chronic lung changes are again noted.
IMPRESSION: No pneumothorax status post left upper lobe lung biopsy.

## 2010-04-21 IMAGING — US CT BIOPSY
1 series · 1 of 1 positions shown · non-contrast
Comparison: none

CLINICAL HISTORY: History of esophageal cancer.  There is a focal
mass in the medial left upper lung and a large left pleural
effusion.

[Series 1: ct biopsy · 1 of 1 slices shown]
[im 1/1]
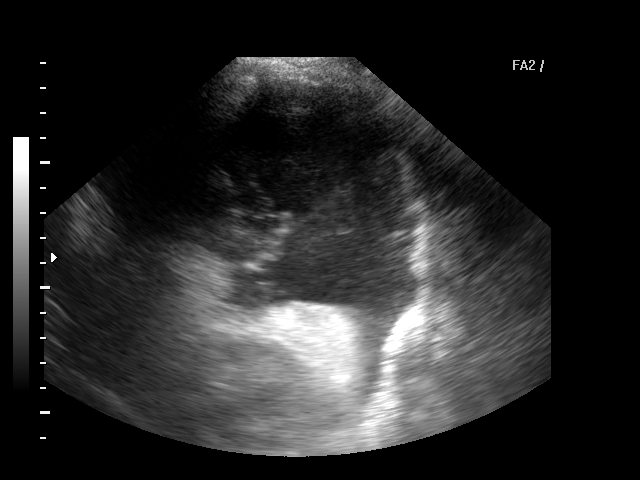

[1 of 1 positions shown; findings below may reference images not displayed]

PROCEDURE(S): CT GUIDED LEFT LUNG BIOPSY; ULTRASOUND GUIDED LEFT
THORACENTESIS

Medications:Versed 1 mg, Fentanyl 50 mcg. A radiology nurse
monitored the patient for moderate sedation.

Moderate sedation time:30 minutes

Procedure:The procedure was explained to the patient.  The risks
and benefits of the procedure were discussed and the patient's
questions were addressed.  Informed consent was obtained from the
patient. Ultrasound demonstrated a large amount of fluid in the
posterior left lung base.  The left back was prepped and draped in
a sterile fashion.  The skin was anesthetized with 1% lidocaine.
The Yueh catheter was directed into the posterior left lung and
clear yellow fluid was aspirated.  650 ml of clear yellow fluid was
removed.  The Yueh catheter was left in place and remained attached
to the collection bag. The patient was placed in a prone position
on the CT scanner.  CT images of the chest demonstrated a mass in
the medial left upper lung.  A posterior approach was selected.
The back was prepped and draped in a sterile fashion.  The skin was
anesthetized with 1% lidocaine.  17 gauge coaxial needle was
directed into the posterior aspect of the lesion and four fine
needle aspirations were obtained.  Three core biopsies were
obtained with an 18 gauge device.  The needle was removed without
complication.  The patient was placed in a supine position after
the procedure.  A few ml of fluid was removed from the Yueh
catheter and no significant pleural air was aspirated.  The
catheter was removed without complication.
FINDINGS: 3.5 cm mass along the medial left lung apex.  There are
underlying emphysematous changes.  650 ml of clear yellow fluid was
removed during the thoracentesis.  Following the biopsy, there was
a tiny amount of pleural air.
IMPRESSION: CT guided left lung mass aspiration and biopsy.

Ultrasound guided left thoracentesis yielding 650 ml of clear
yellow fluid.

## 2010-05-14 IMAGING — CR DG CHEST 1V PORT
1 series · 1 of 1 positions shown · non-contrast
Comparison: 02/22/2010

CLINICAL DATA: Central line placement

PORTABLE CHEST - 1 VIEW

[view not recorded]
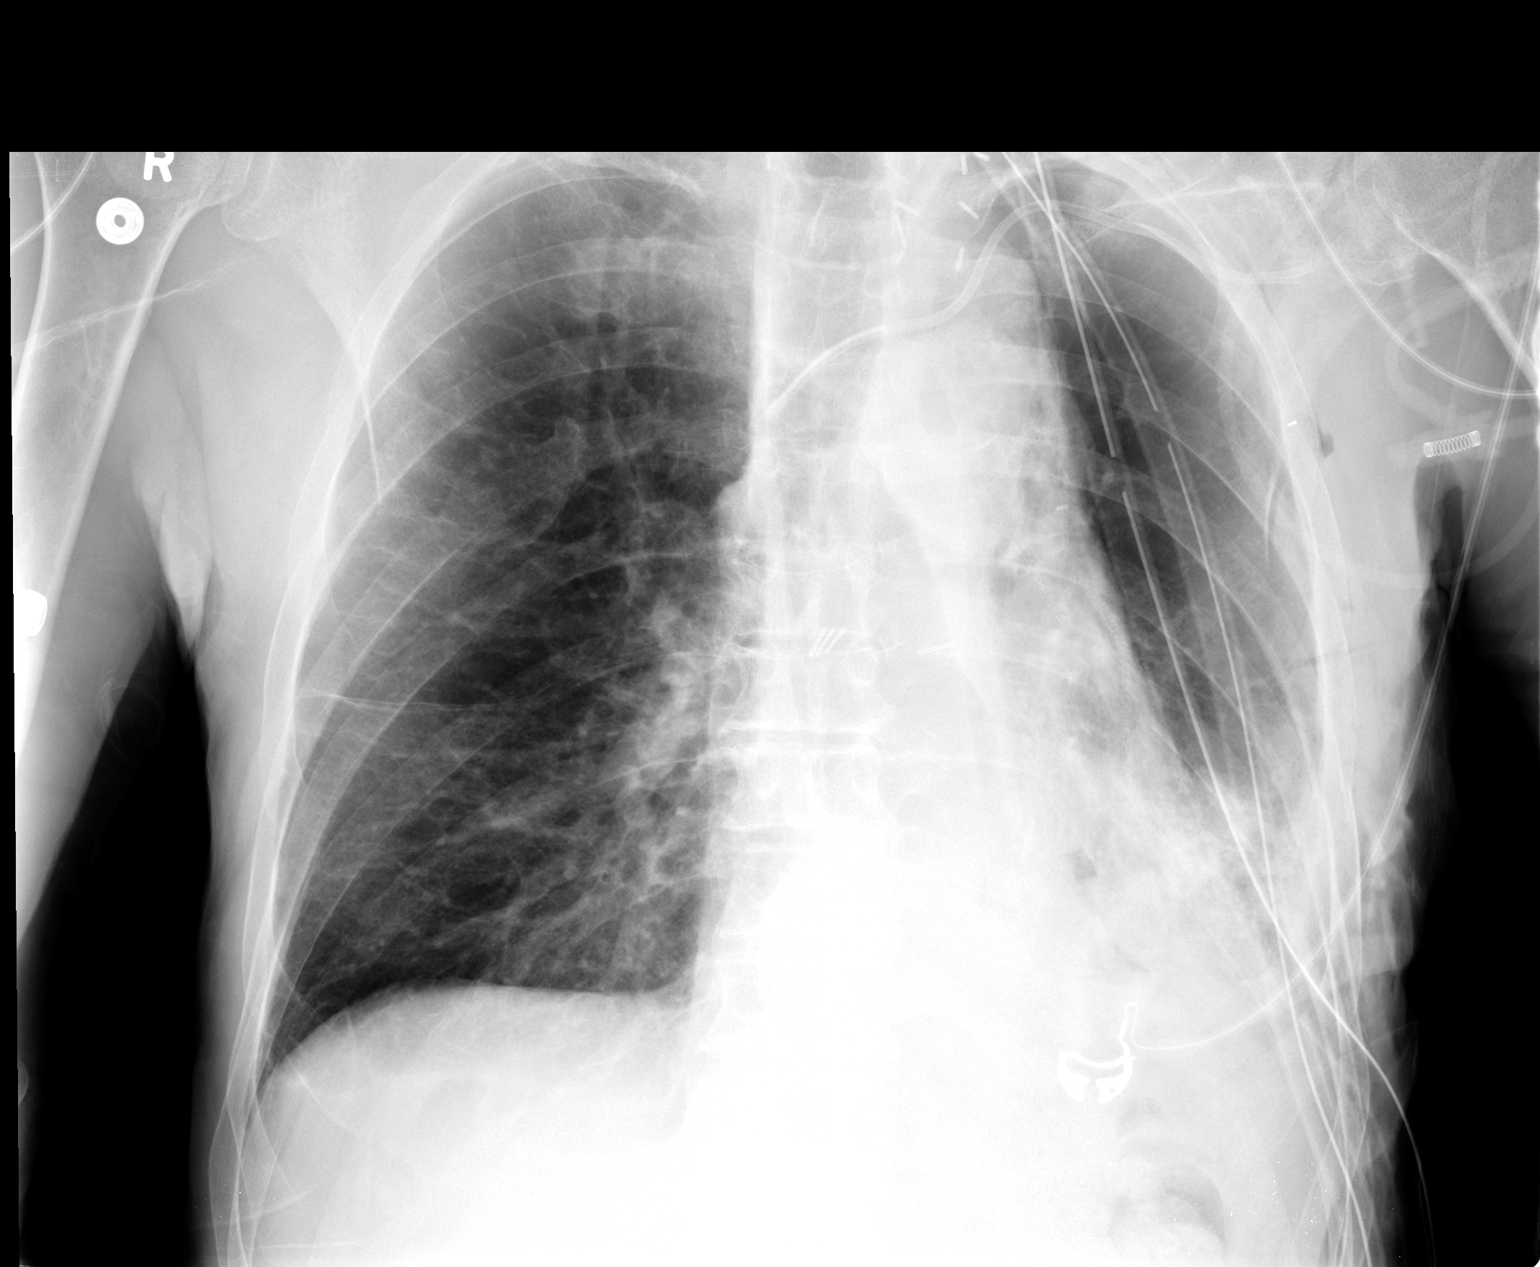

[1 of 1 positions shown; findings below may reference images not displayed]

FINDINGS: Cardiomediastinal silhouette is stable.  There are two
left chest tubes.  There is no diagnostic pneumothorax.  Surgical
clips are noted in the left upper lobe medially.  There is left
basilar atelectasis or infiltrate.  No diagnostic pneumothorax.
There is a left subclavian central venous line with tip in SVC.

There is probable nondisplaced fracture of the left sixth rib.

No pulmonary edema.
IMPRESSION: There are two left chest tubes.  There is no diagnostic
pneumothorax.  Surgical clips are noted in the left upper lobe
medially.  There is left basilar atelectasis or infiltrate.  No
diagnostic pneumothorax.  There is a left subclavian central venous
line with tip in SVC.

There is probable nondisplaced fracture of the left sixth rib.

## 2010-05-15 IMAGING — CR DG CHEST 1V PORT
1 series · 1 of 1 positions shown · non-contrast
Comparison: Portable chest x-ray of 02/24/2010

CLINICAL DATA: Postop left upper lobe lung lesion, follow-up

PORTABLE CHEST - 1 VIEW

[view not recorded]
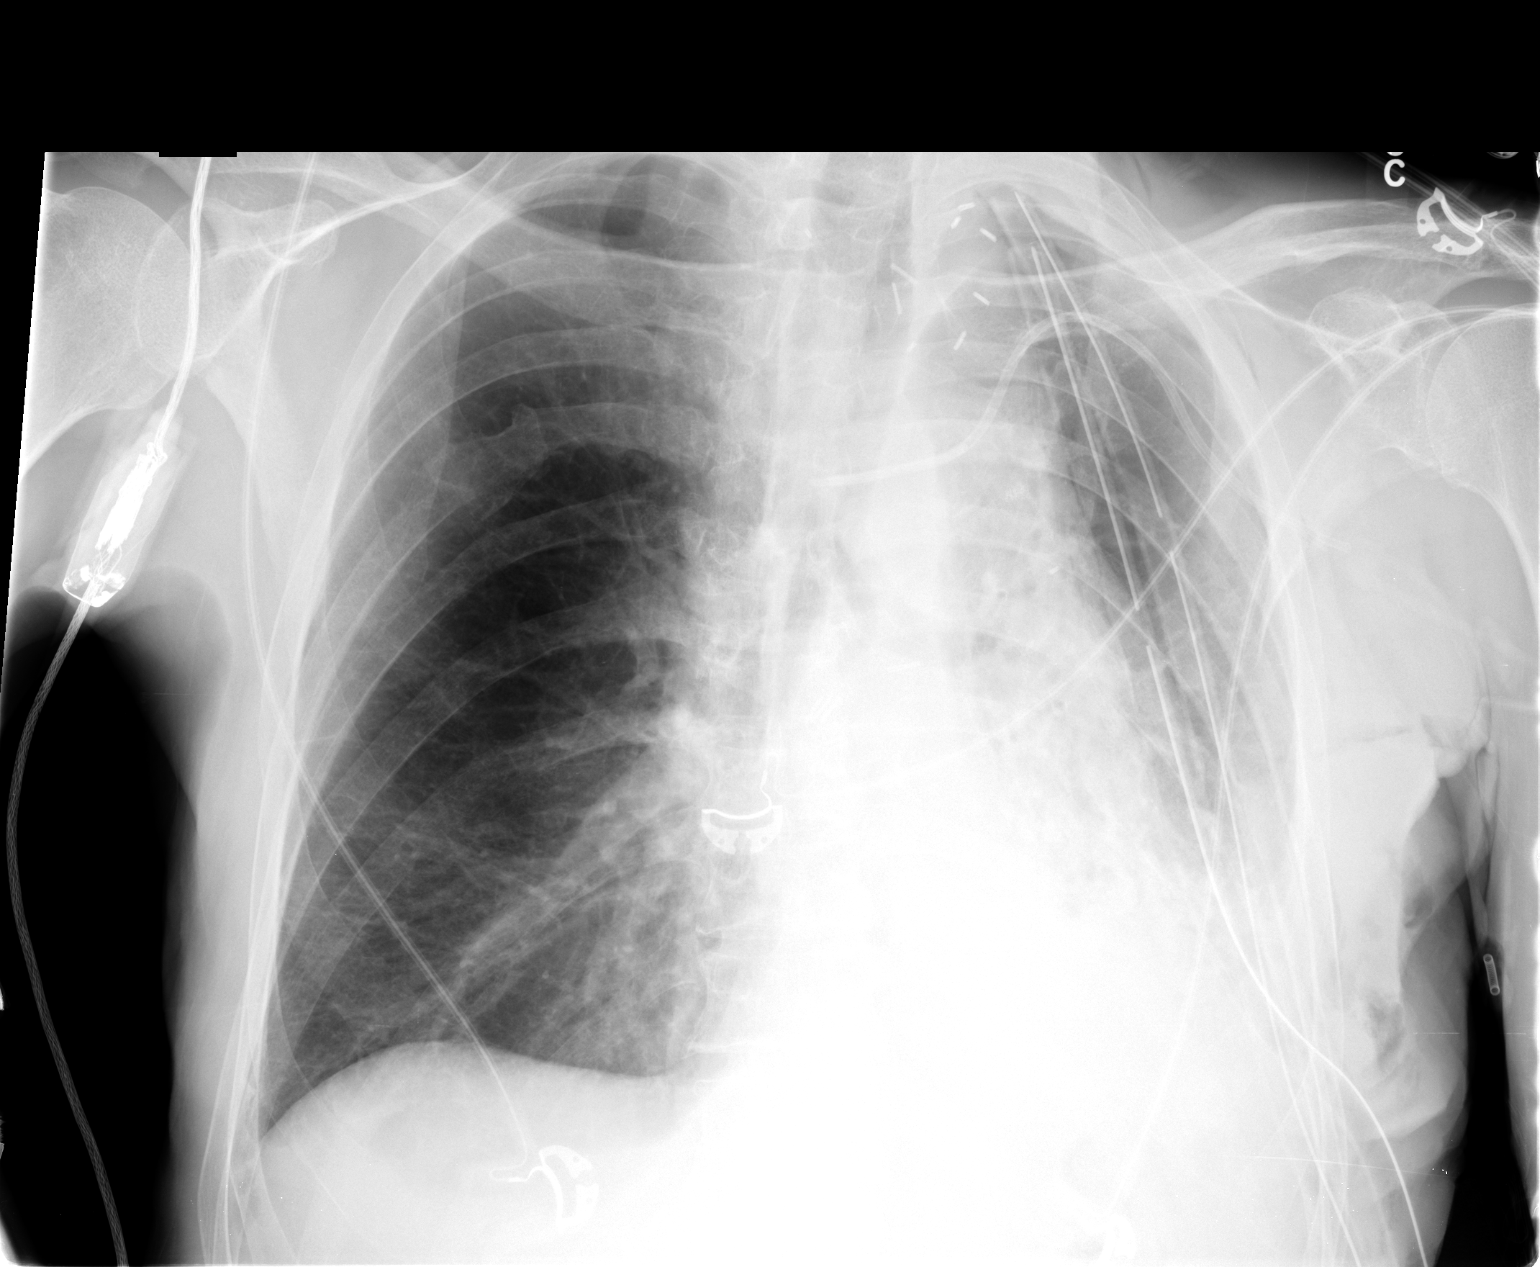

[1 of 1 positions shown; findings below may reference images not displayed]

FINDINGS: Two left chest tubes remain and postoperative changes on
the left are noted with volume loss and opacity at the left lung
base.  No pneumothorax is seen.  Left central venous catheter tip
is seen to the junction of the left innominate vein and SVC.  The
right lung is clear.  Heart size is stable
IMPRESSION: Stable postop change on the left with slight decrease in aeration.
Two left chest tubes remain and no pneumothorax is seen.

## 2010-05-16 IMAGING — CR DG CHEST 1V PORT
1 series · 1 of 1 positions shown · non-contrast
Comparison: the previous day's study

CLINICAL DATA: Lung lesion

PORTABLE CHEST - 1 VIEW

[AP]
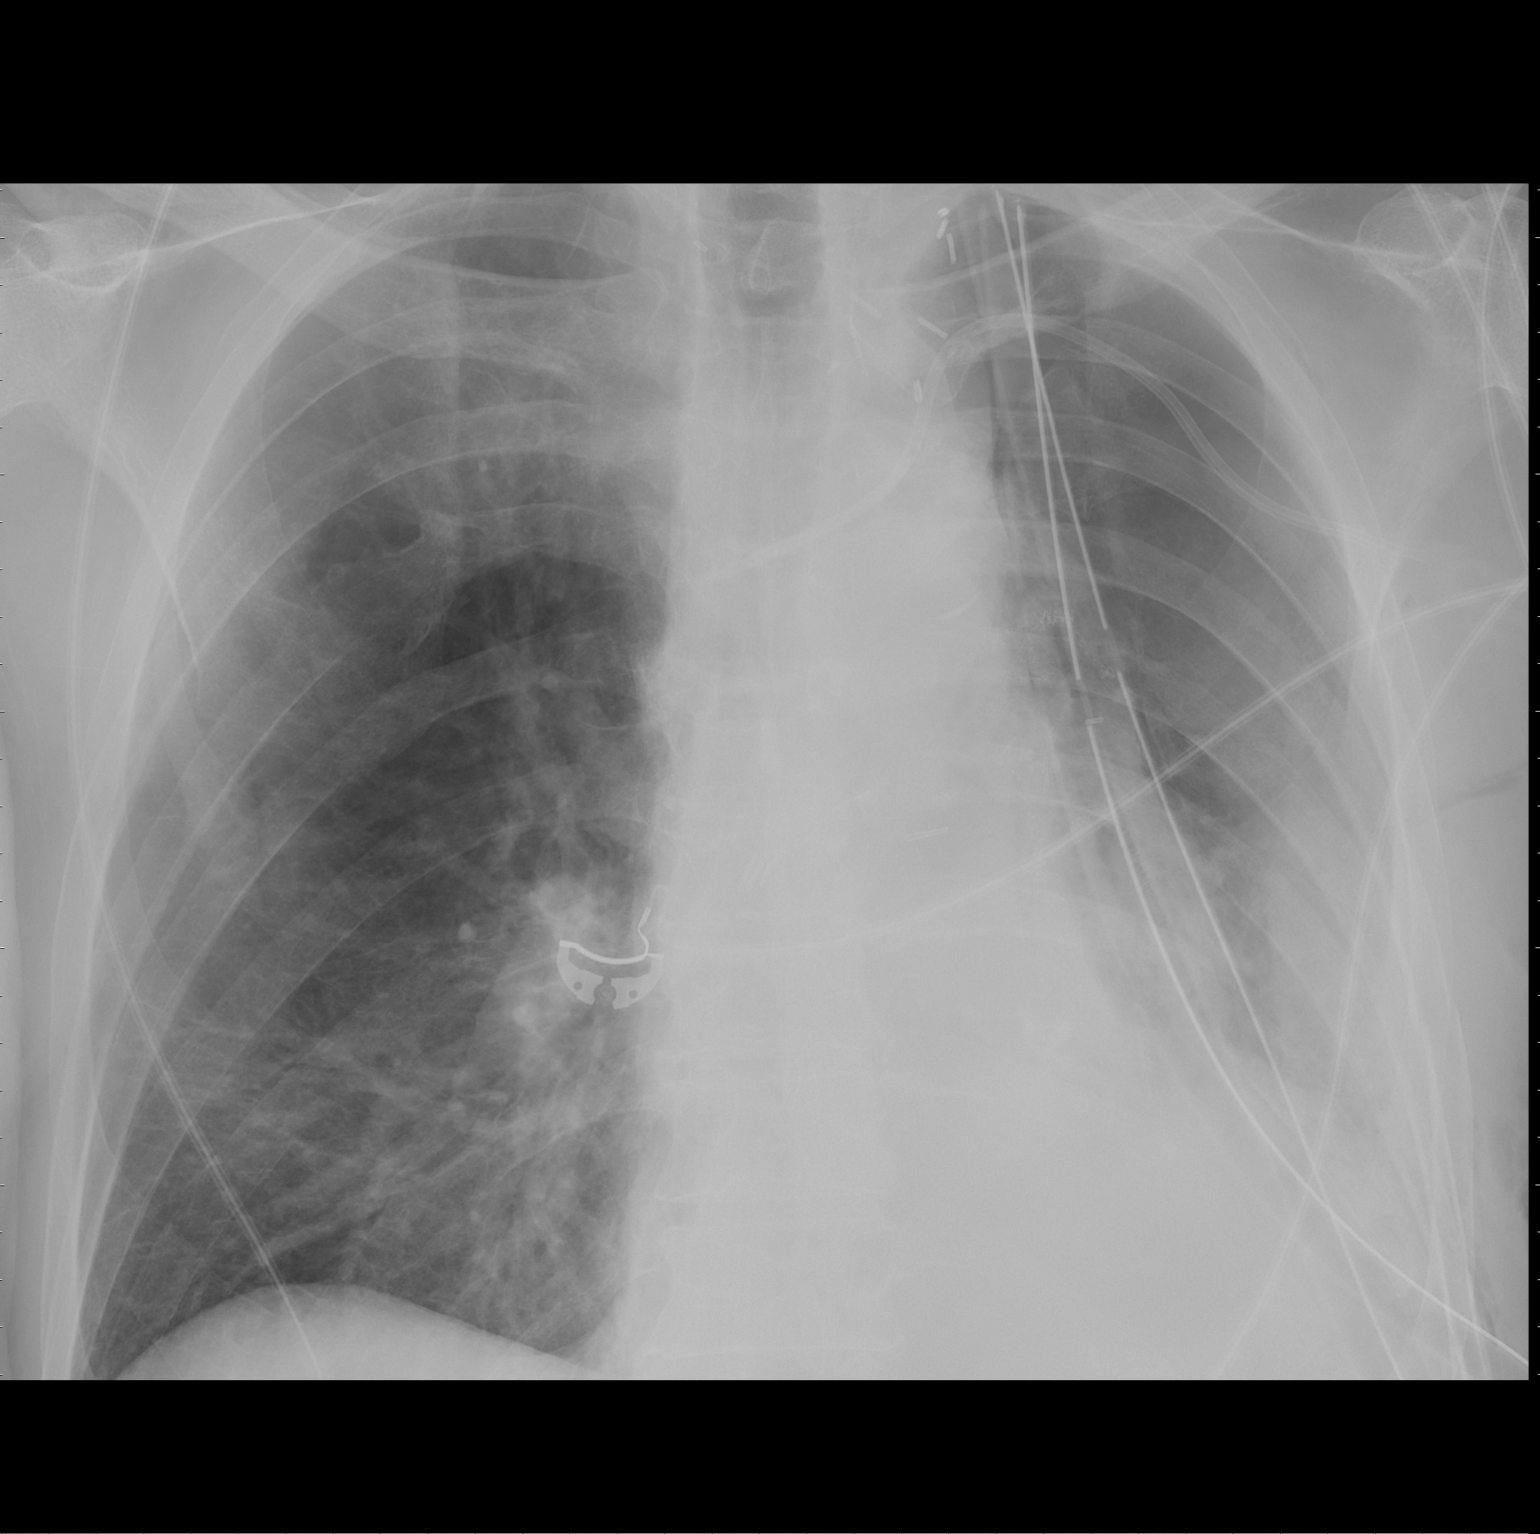

[1 of 1 positions shown; findings below may reference images not displayed]

FINDINGS: Left subclavian central line and two left chest tubes are
stable in position.  No pneumothorax.  Staple line at the left
hilum as before.  Improved left lung aeration with persistent
infrahilar consolidation / atelectasis.  Patchy interstitial
opacities in the right lung base as before.  Heart size difficult
to assess due to adjacent opacities.  Atheromatous aortic arch.
IMPRESSION: 1.  Some improvement in left lung aeration.
2. Support hardware stable in position.
3.  No pneumothorax

## 2010-05-17 IMAGING — CR DG CHEST 1V PORT
1 series · 1 of 1 positions shown · non-contrast
Comparison: Earlier today at 2414

CLINICAL DATA: Left upper lobe lesion; chest tube removal

PORTABLE CHEST - 1 VIEW

[view not recorded]
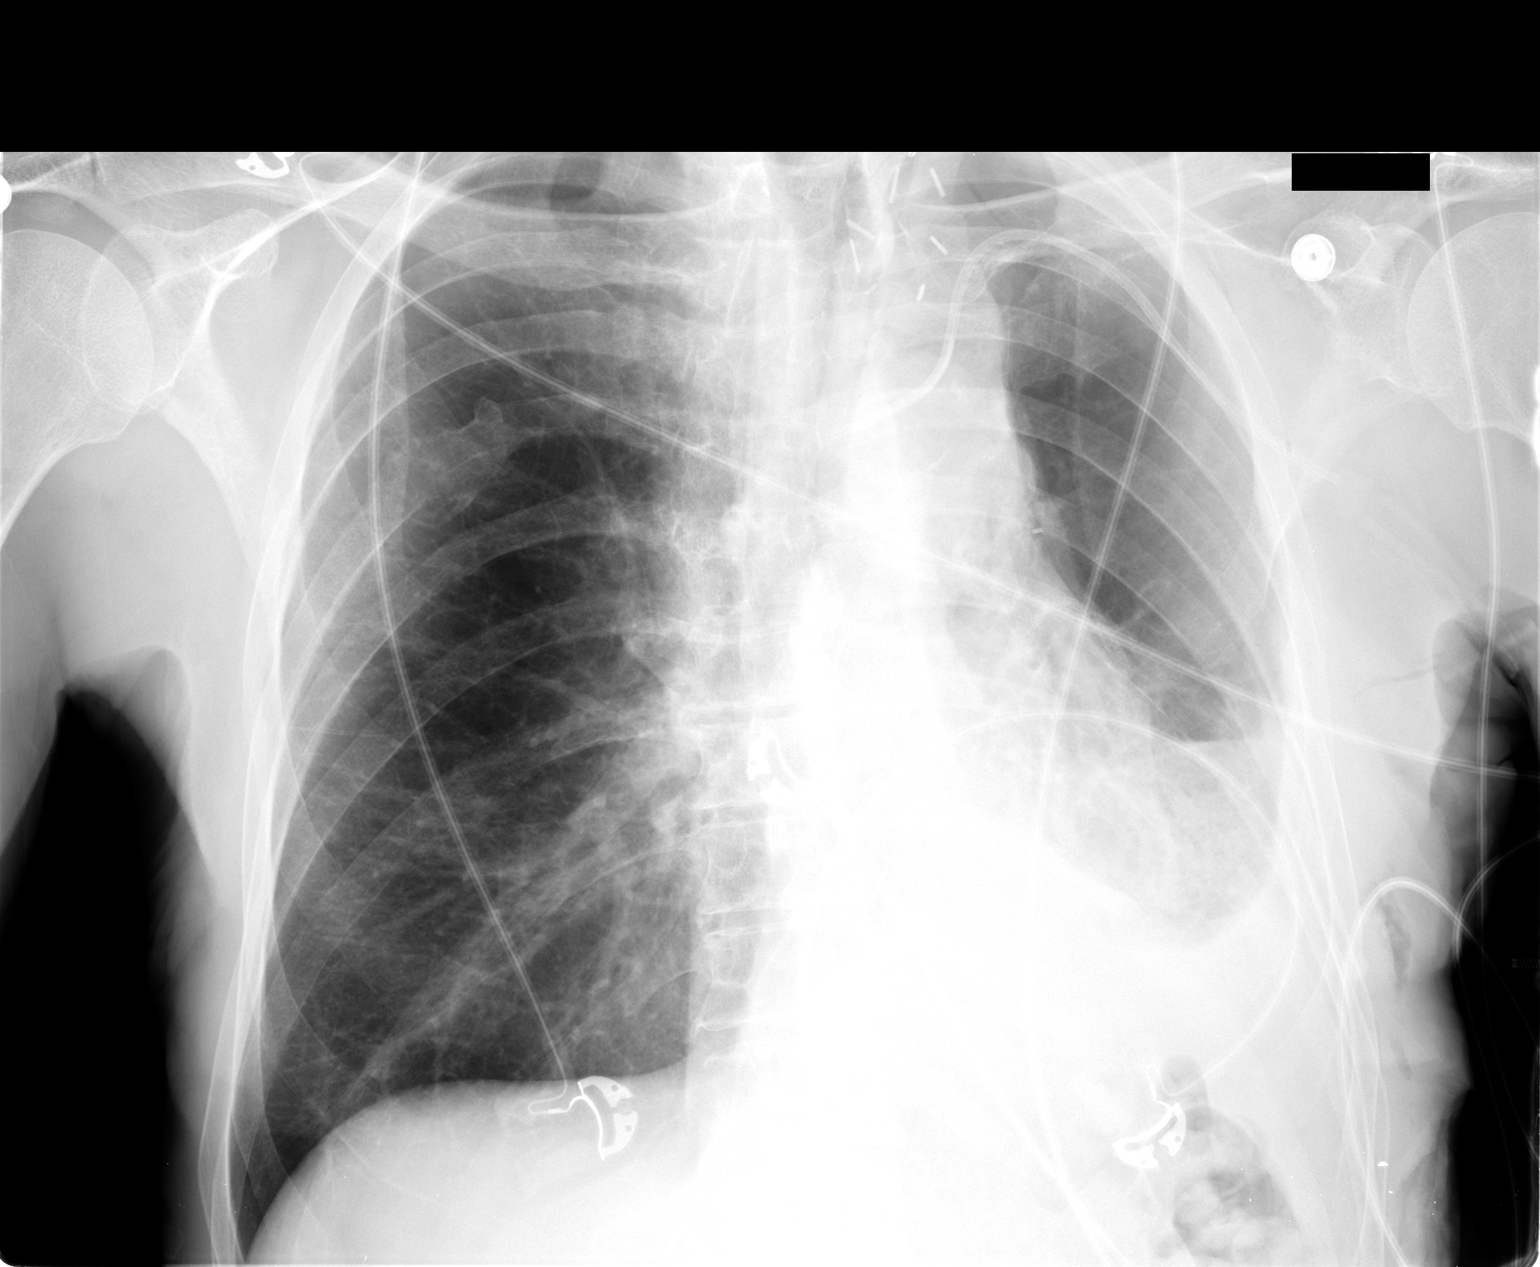

[1 of 1 positions shown; findings below may reference images not displayed]

FINDINGS: The left  chest tube has been removed.  There is a small
apical pneumothorax.  Left basilar atelectasis and pleural effusion
are present.  The right lung is clear.
IMPRESSION: Small left apical pneumothorax.

## 2010-05-17 IMAGING — CR DG CHEST 1V PORT
1 series · 1 of 1 positions shown · non-contrast
Comparison: February 26, 2010

CLINICAL DATA: Left upper lobe lesion; status post thoracoscopy

PORTABLE CHEST - 1 VIEW

[view not recorded]
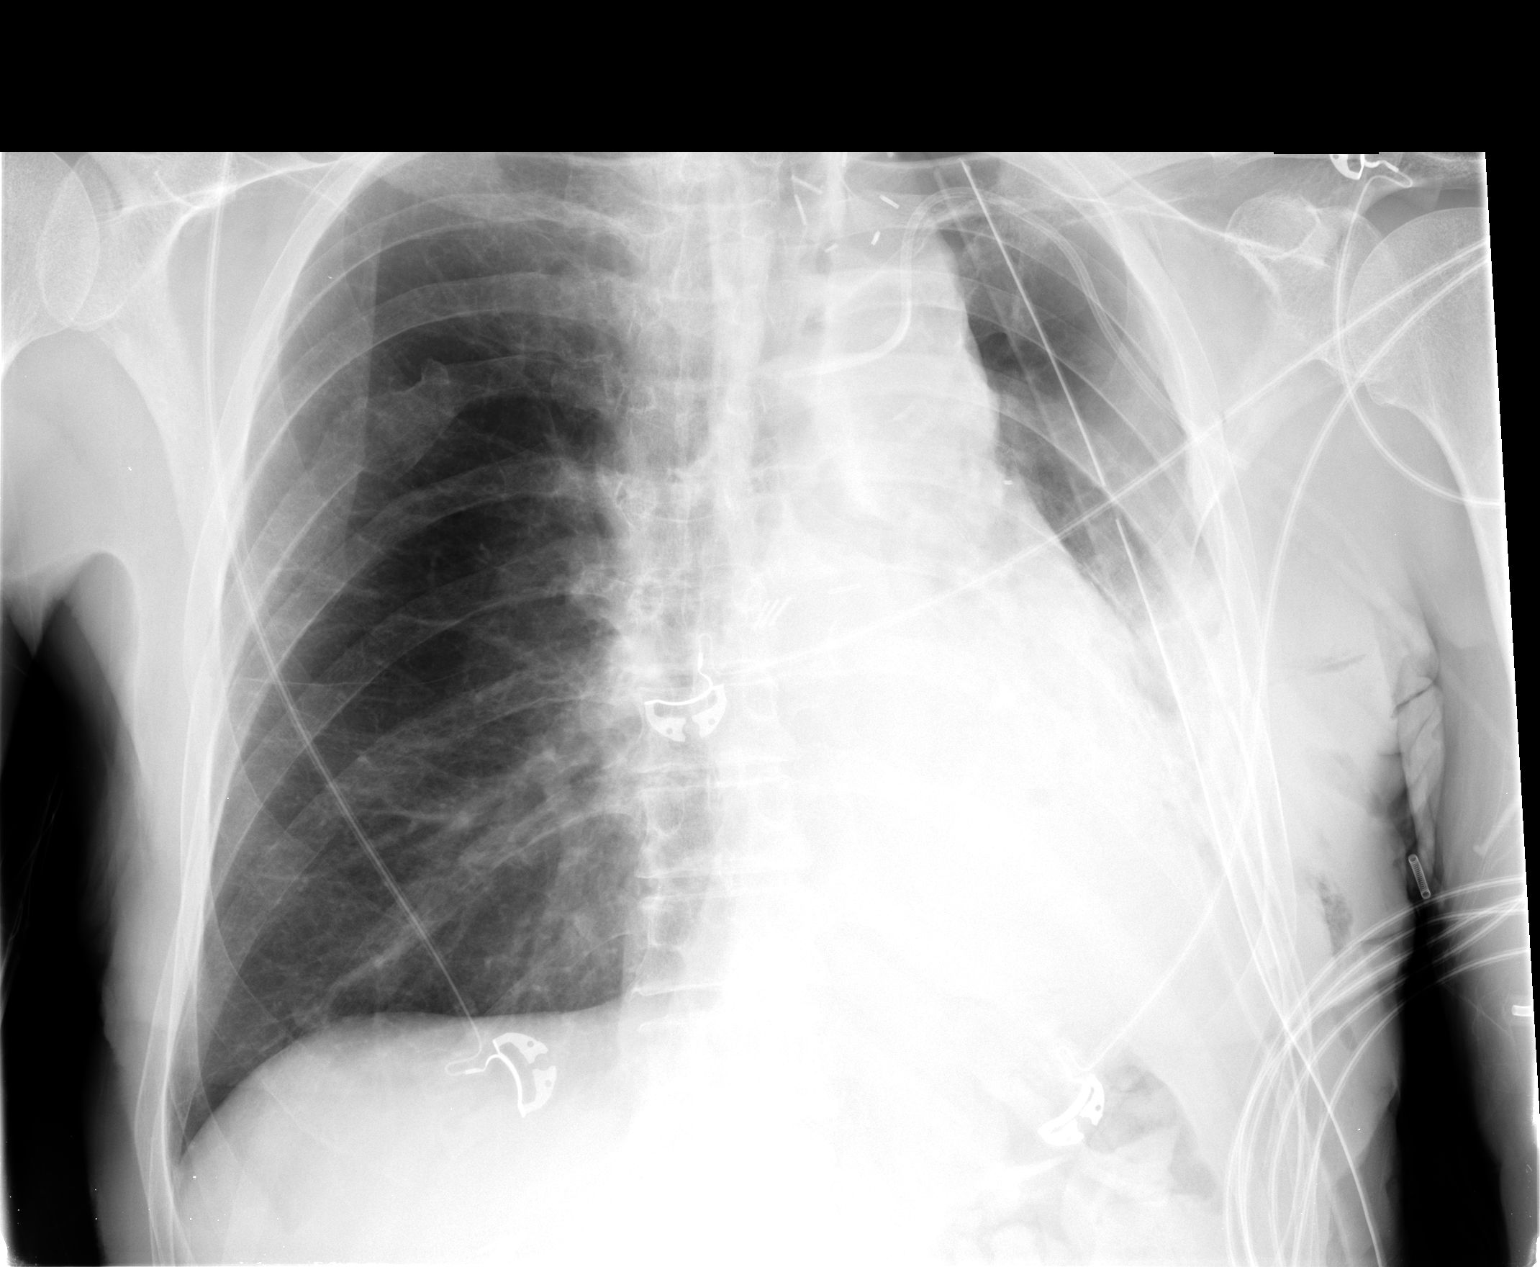

[1 of 1 positions shown; findings below may reference images not displayed]

FINDINGS: A single left chest tube remains present at the left
apex.  There is no gross pneumothorax on the left.The left
subclavian central line tip is at the midline, overlying the left
to subclavian vein.  There is increased volume loss and opacity
within the left lower lung.
IMPRESSION: Interval volume loss and opacity within the left lower
lung.

## 2010-05-18 IMAGING — CR DG CHEST 2V
2 series · 2 of 2 positions shown · non-contrast
Comparison: 02/27/2010

CLINICAL DATA: Status post resection of left upper lobe lung
lesion.

CHEST - 2 VIEW

[w chest pa]
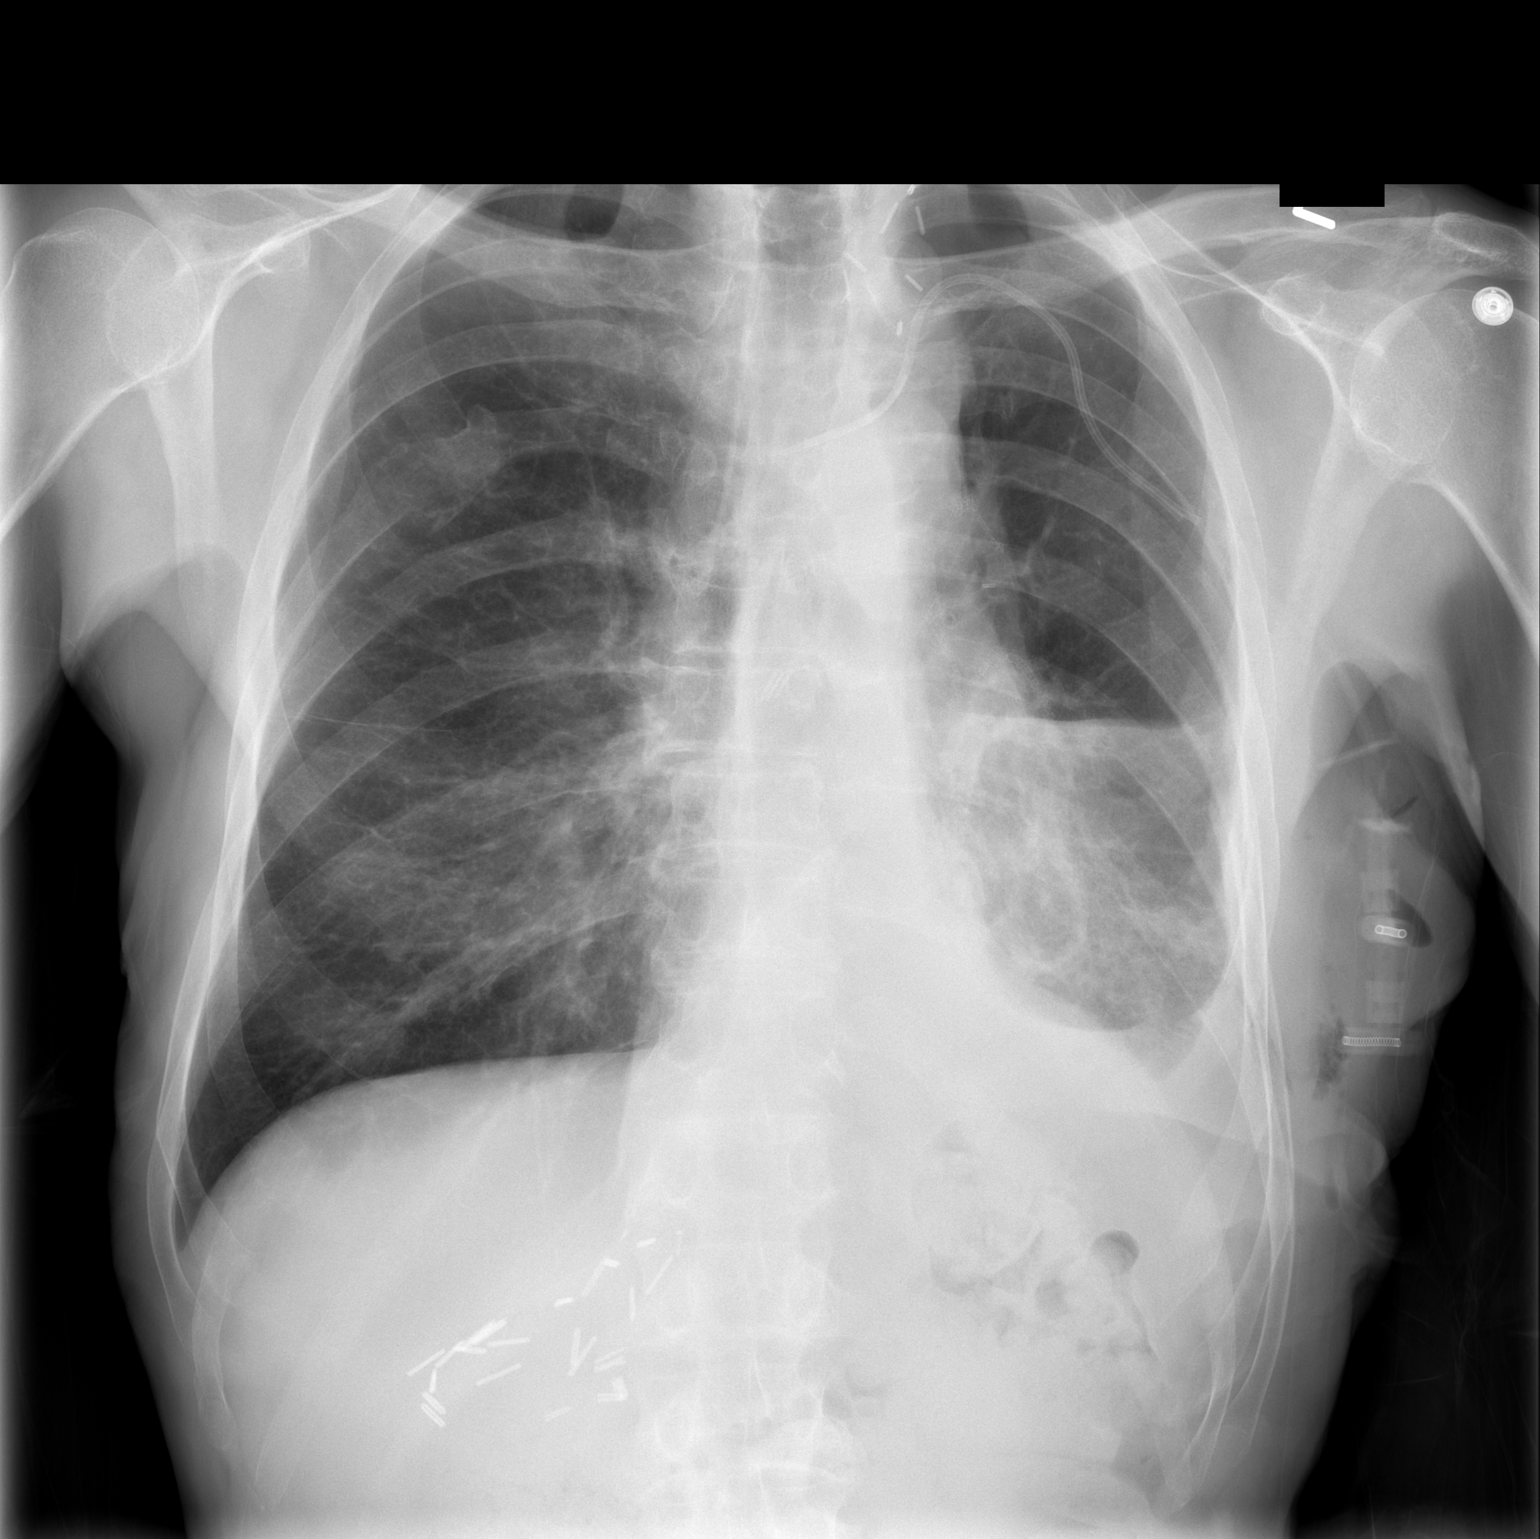

[w chest lat]
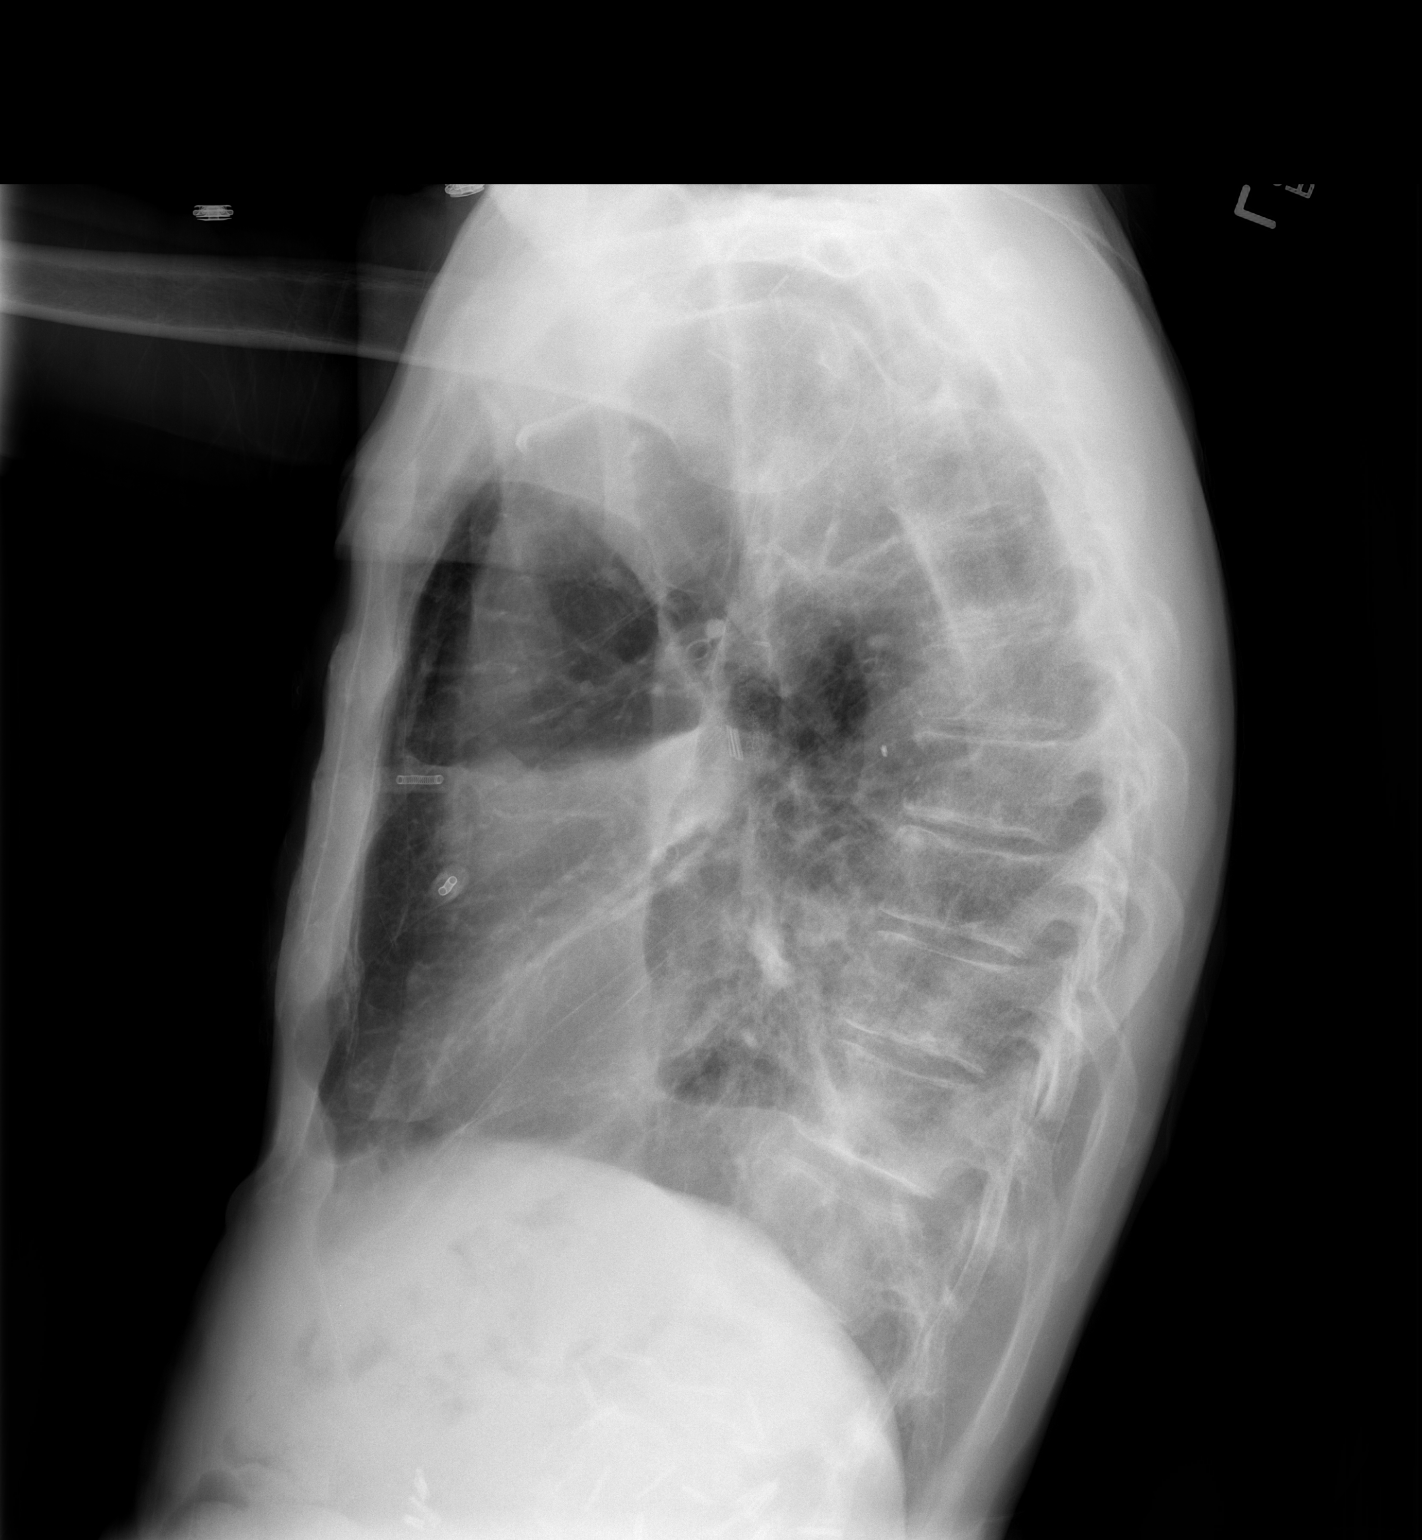

[2 of 2 positions shown; findings below may reference images not displayed]

FINDINGS: Residual anterior hydropneumothorax remains present.
Apical component of pneumothorax is stable.  There is atelectasis
involving both lower lungs.  Central line is in stable position.
IMPRESSION: Residual hydropneumothorax on the left with stable apical
component.

## 2010-05-19 IMAGING — CR DG CHEST 2V
2 series · 2 of 2 positions shown · non-contrast
Comparison: 02/28/2010, 02/01/2010, PET CT 01/19/2010 and CT chest
01/18/2010

CLINICAL DATA: Left upper lobe lesion, abscess.

CHEST - 2 VIEW

[w chest pa]
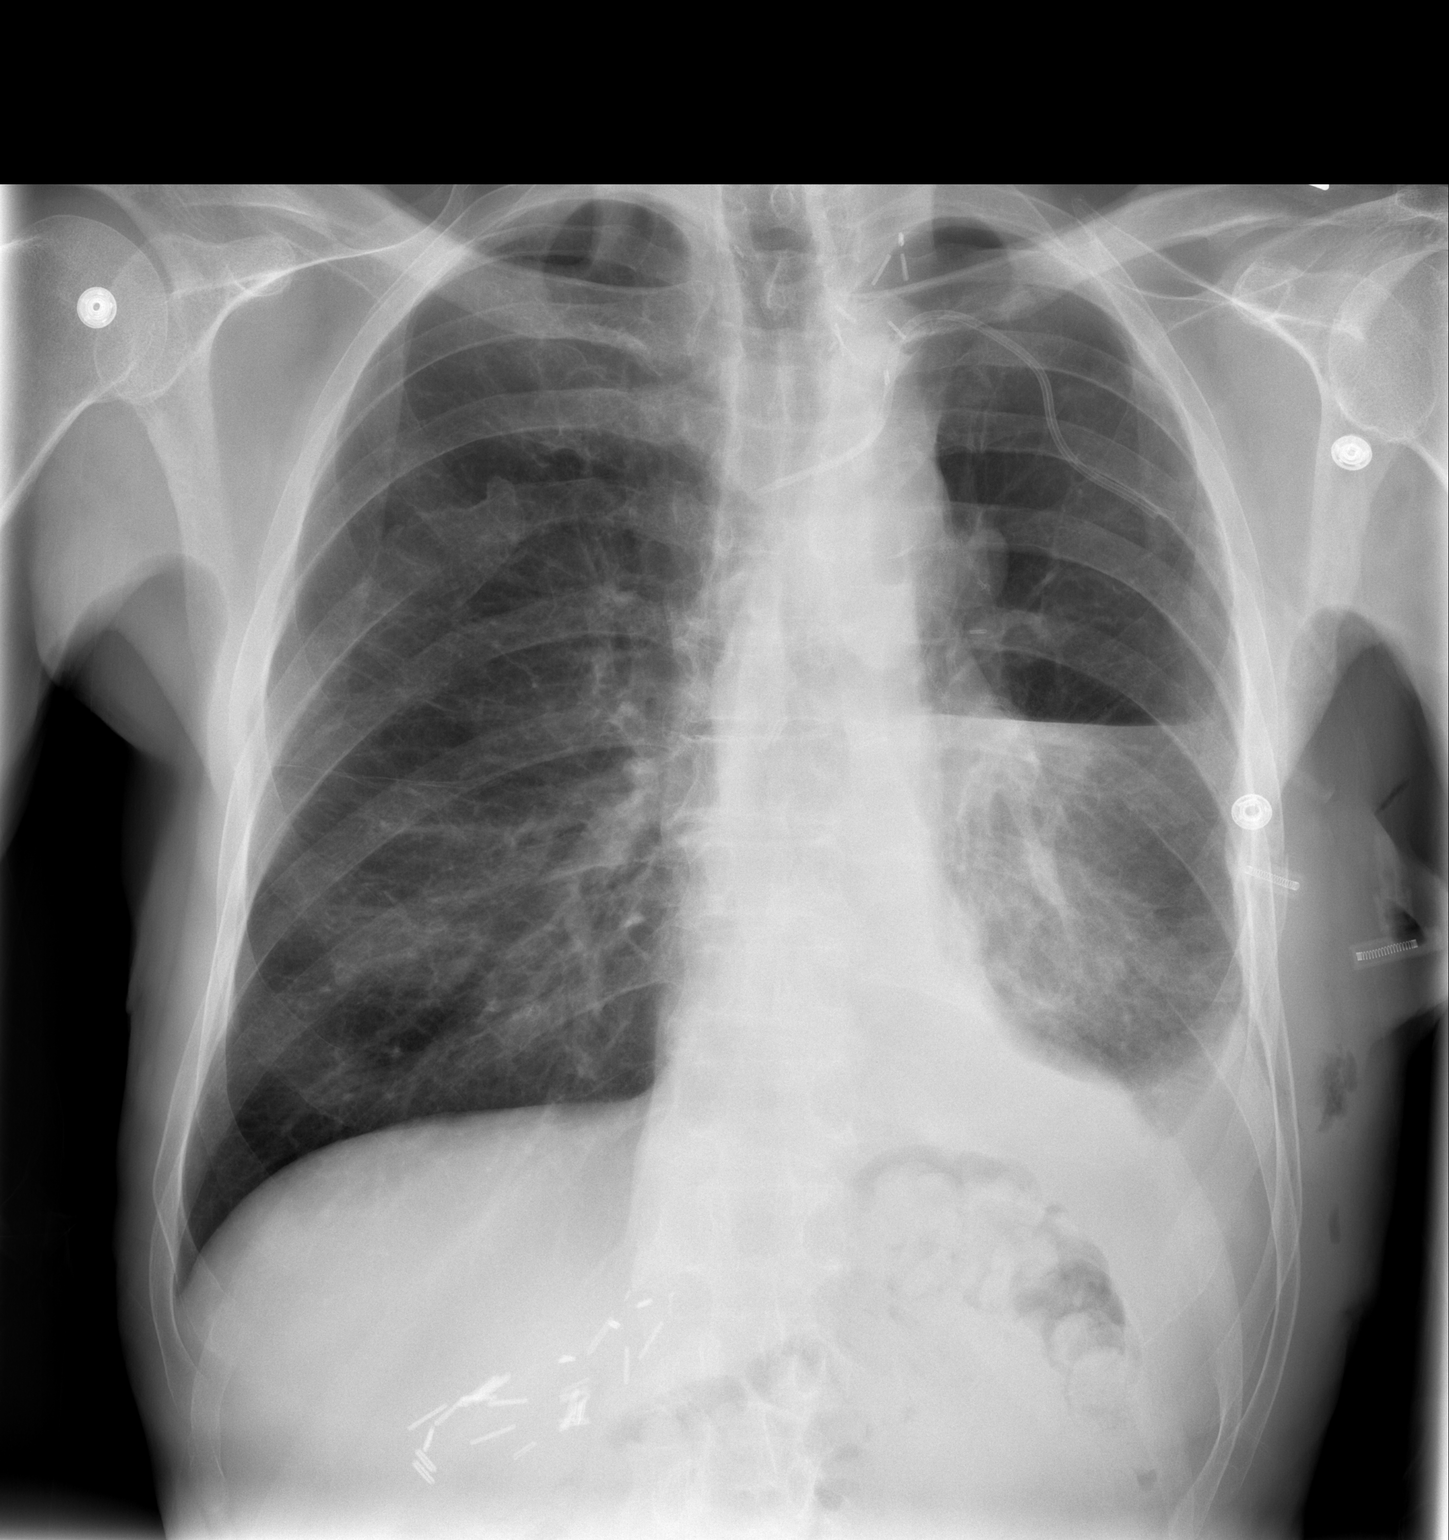

[w chest lat]
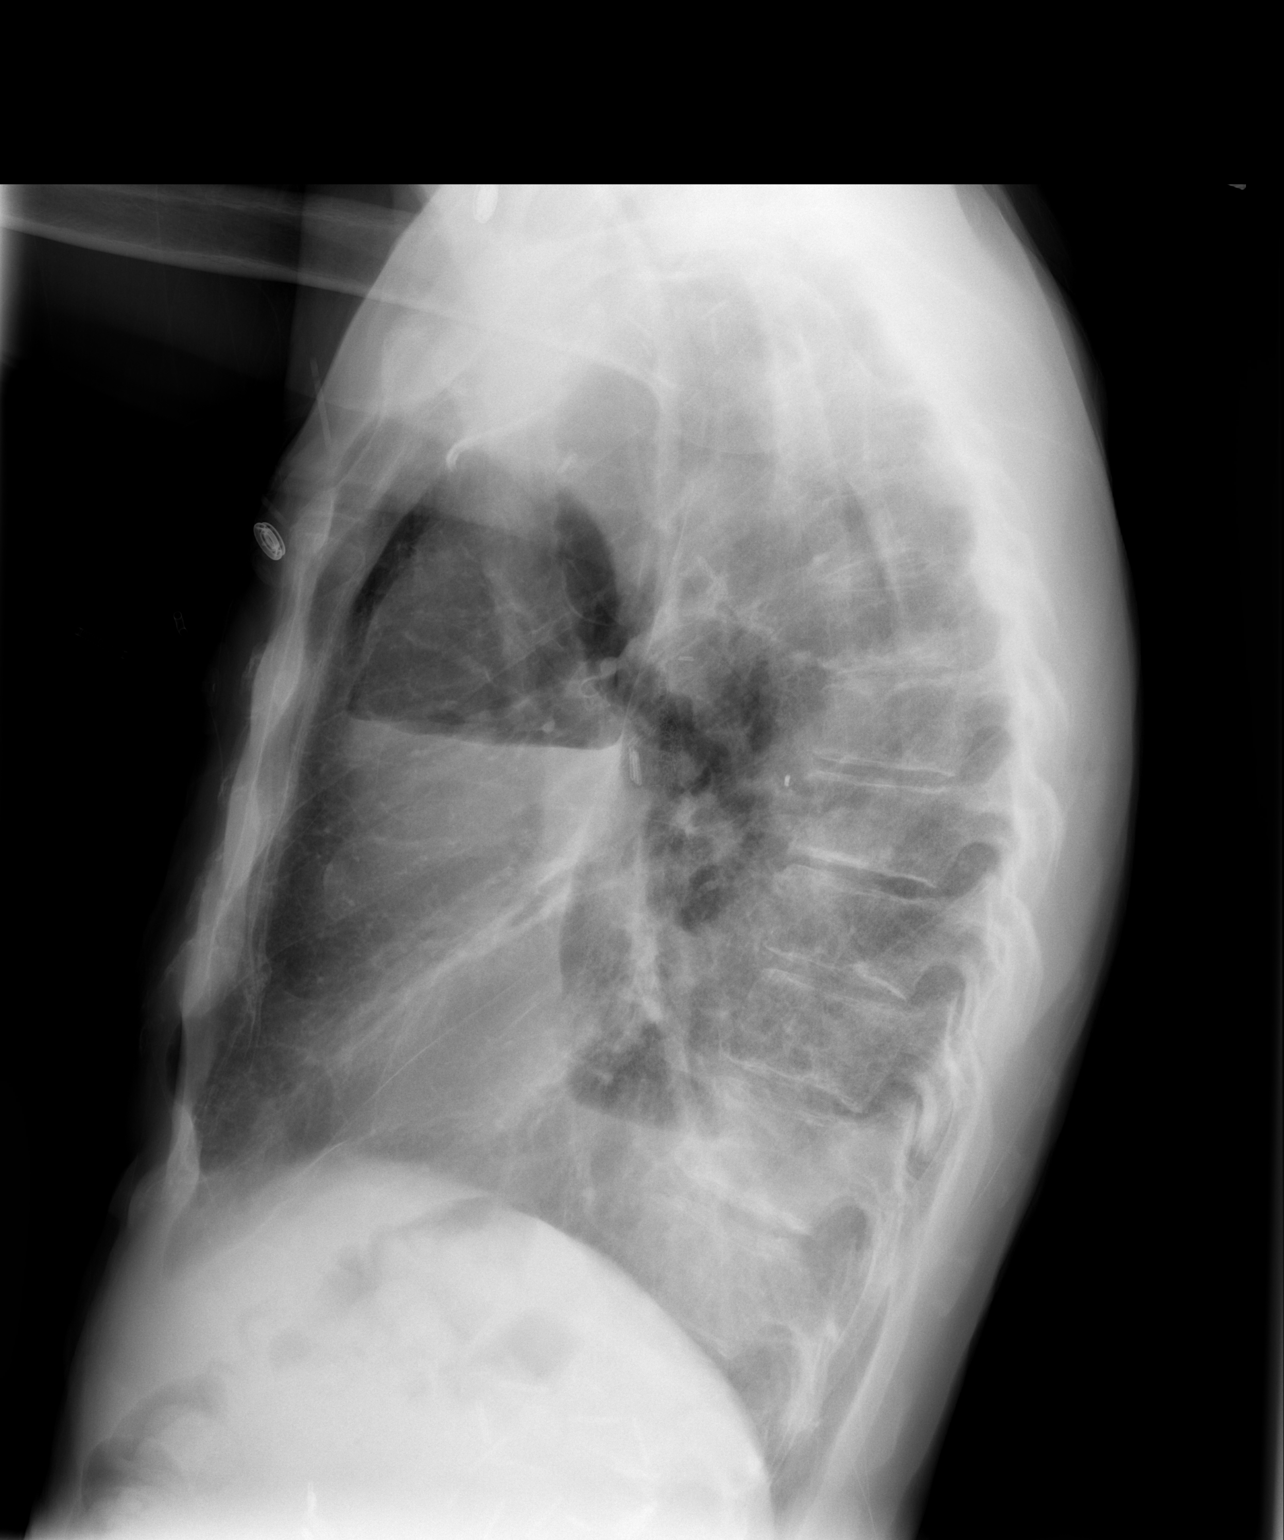

[2 of 2 positions shown; findings below may reference images not displayed]

FINDINGS: Trachea is midline.  Left-sided central line tip projects
over the left brachiocephalic vein.  There are postoperative
changes in the left hemithorax, with a stable left
hydropneumothorax.  Left basilar airspace disease.  Right lung is
clear. Old right rib fracture. Left thoracotomy changes.
Subcutaneous emphysema in the left chest wall is again noted.
Surgical clips are seen in the left paratracheal region and right
upper quadrant.
IMPRESSION: 1.  Postoperative changes in the left hemithorax, with a stable
left hydropneumothorax.  A bronchopleural fistula cannot be
excluded.
2.  Left basilar airspace disease.

## 2010-05-28 IMAGING — CR DG CHEST 2V
2 series · 2 of 2 positions shown · non-contrast
Comparison: Chest x-ray of 03/01/2010

CLINICAL DATA: Status post left lung surgery for carcinoma, also
history of esophageal carcinoma, follow-up

CHEST - 2 VIEW

[w chest pa]
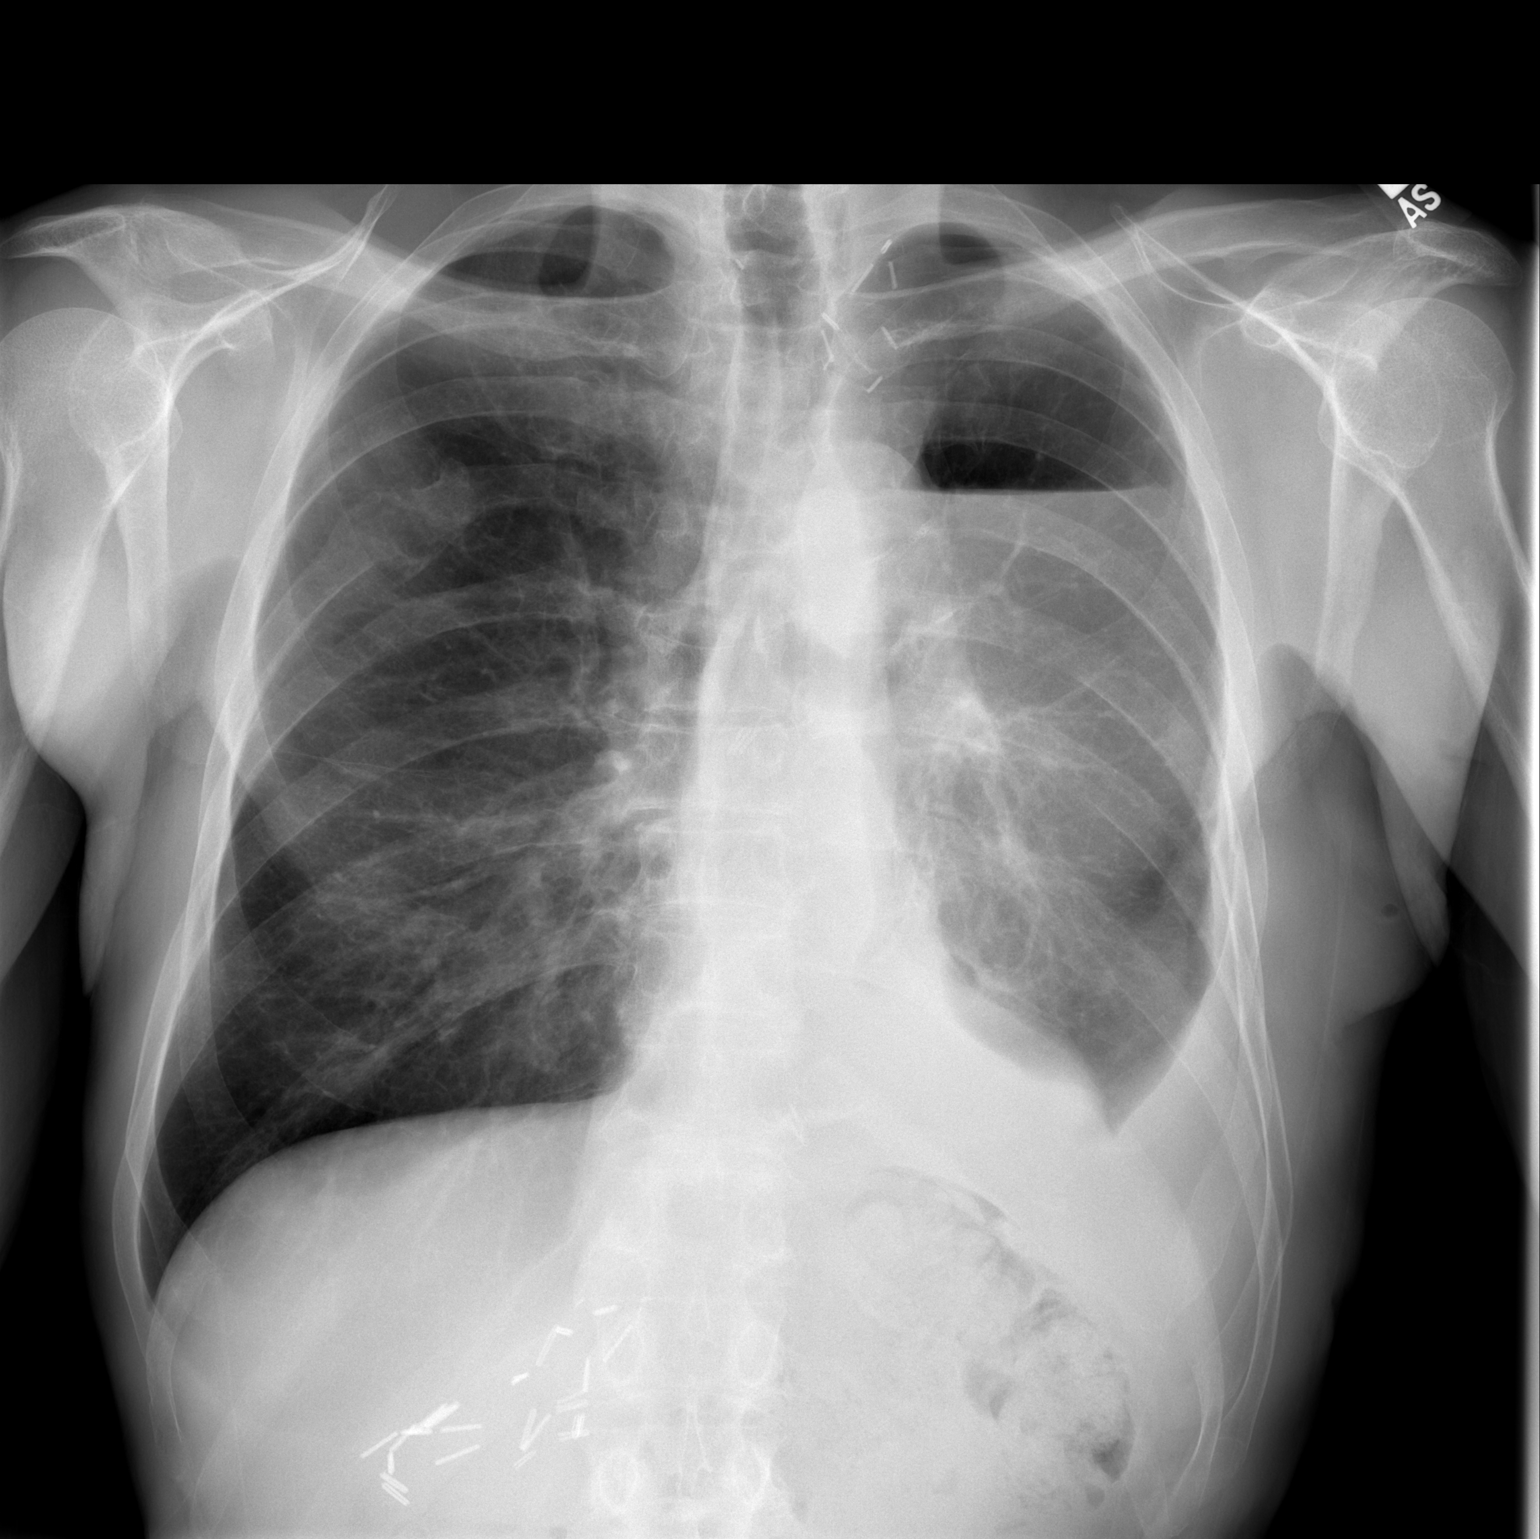

[w chest lat]
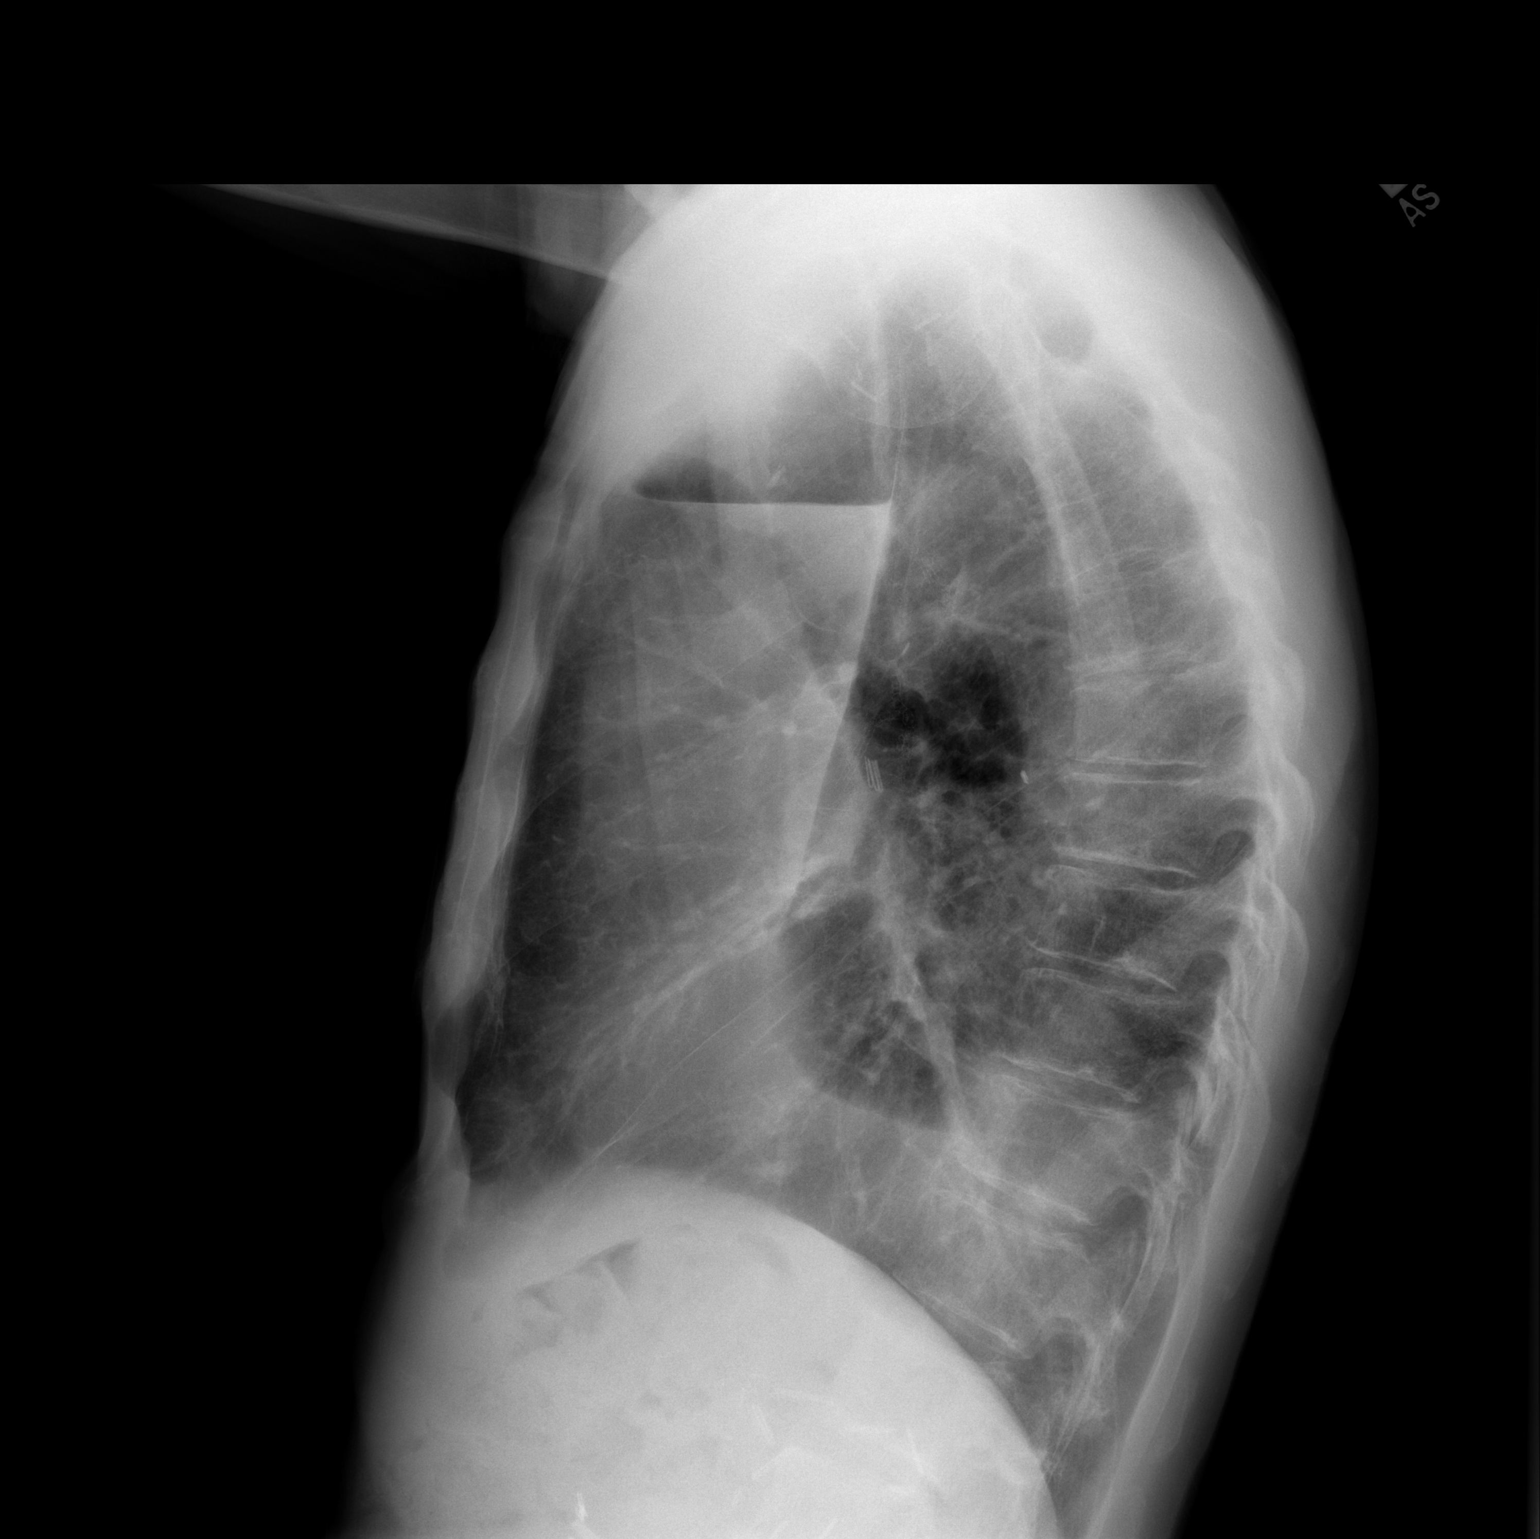

[2 of 2 positions shown; findings below may reference images not displayed]

FINDINGS: More fluid is now noted within the loculated anterior
left hydropneumothorax.  The left pleural effusion is unchanged.
Right lung is clear.  Heart size is stable.
IMPRESSION: Slight increase in fluid within the loculated anterior left
hydropneumothorax.  Little change in small left pleural effusion.

## 2010-06-02 ENCOUNTER — Encounter: Admission: RE | Admit: 2010-06-02 | Discharge: 2010-06-02 | Payer: Self-pay | Admitting: Thoracic Surgery

## 2010-06-02 ENCOUNTER — Ambulatory Visit: Payer: Self-pay | Admitting: Thoracic Surgery

## 2010-07-09 IMAGING — CR DG CHEST 2V
2 series · 2 of 2 positions shown · non-contrast
Comparison: [HOSPITAL] at [REDACTED] [HOSPITAL] chest CT
01/18/2010 and chest x-ray 03/24/2010.

CLINICAL DATA: Shortness of breath on exertion.  Former smoker,
postop left lung, lung cancer, esophagectomy with gastric pull-up.

CHEST - 2 VIEW

[w chest pa]
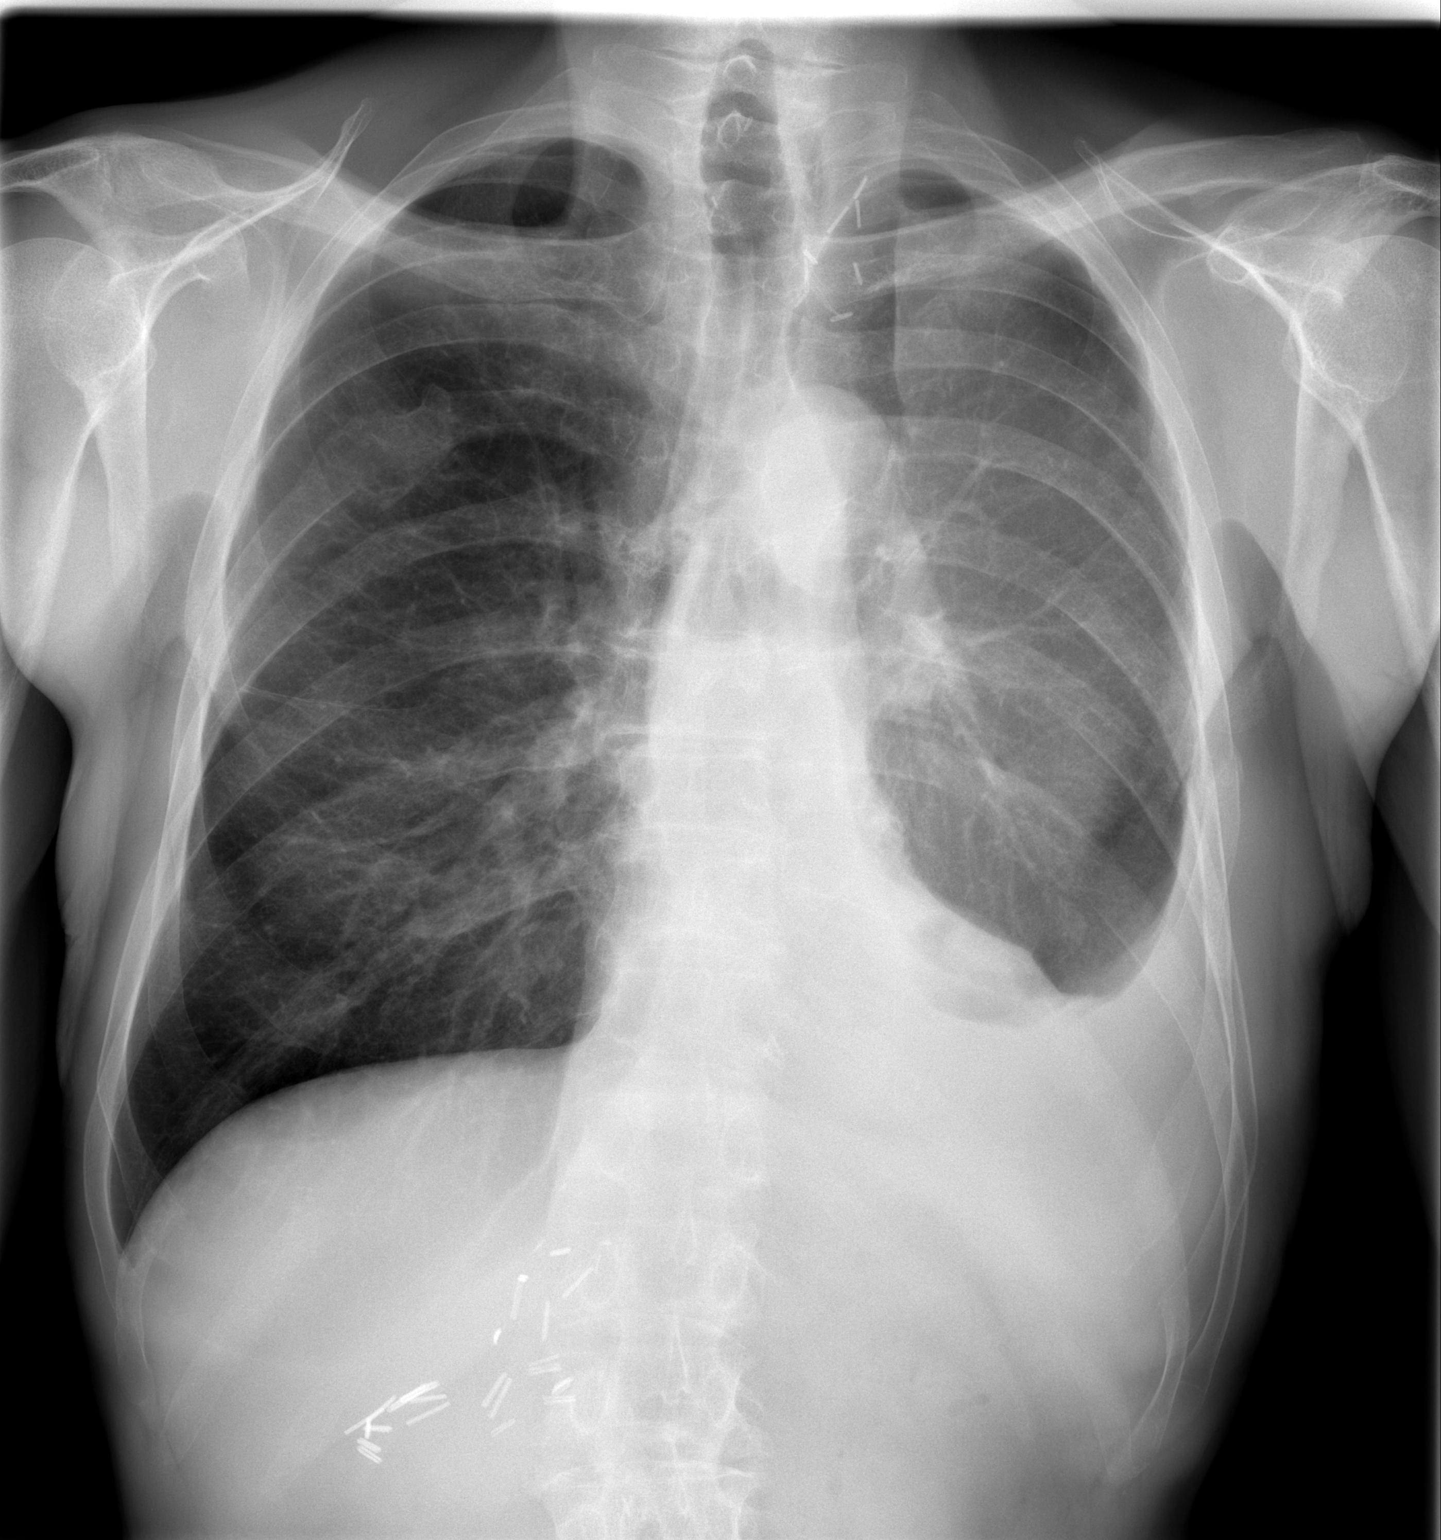

[w chest lat]
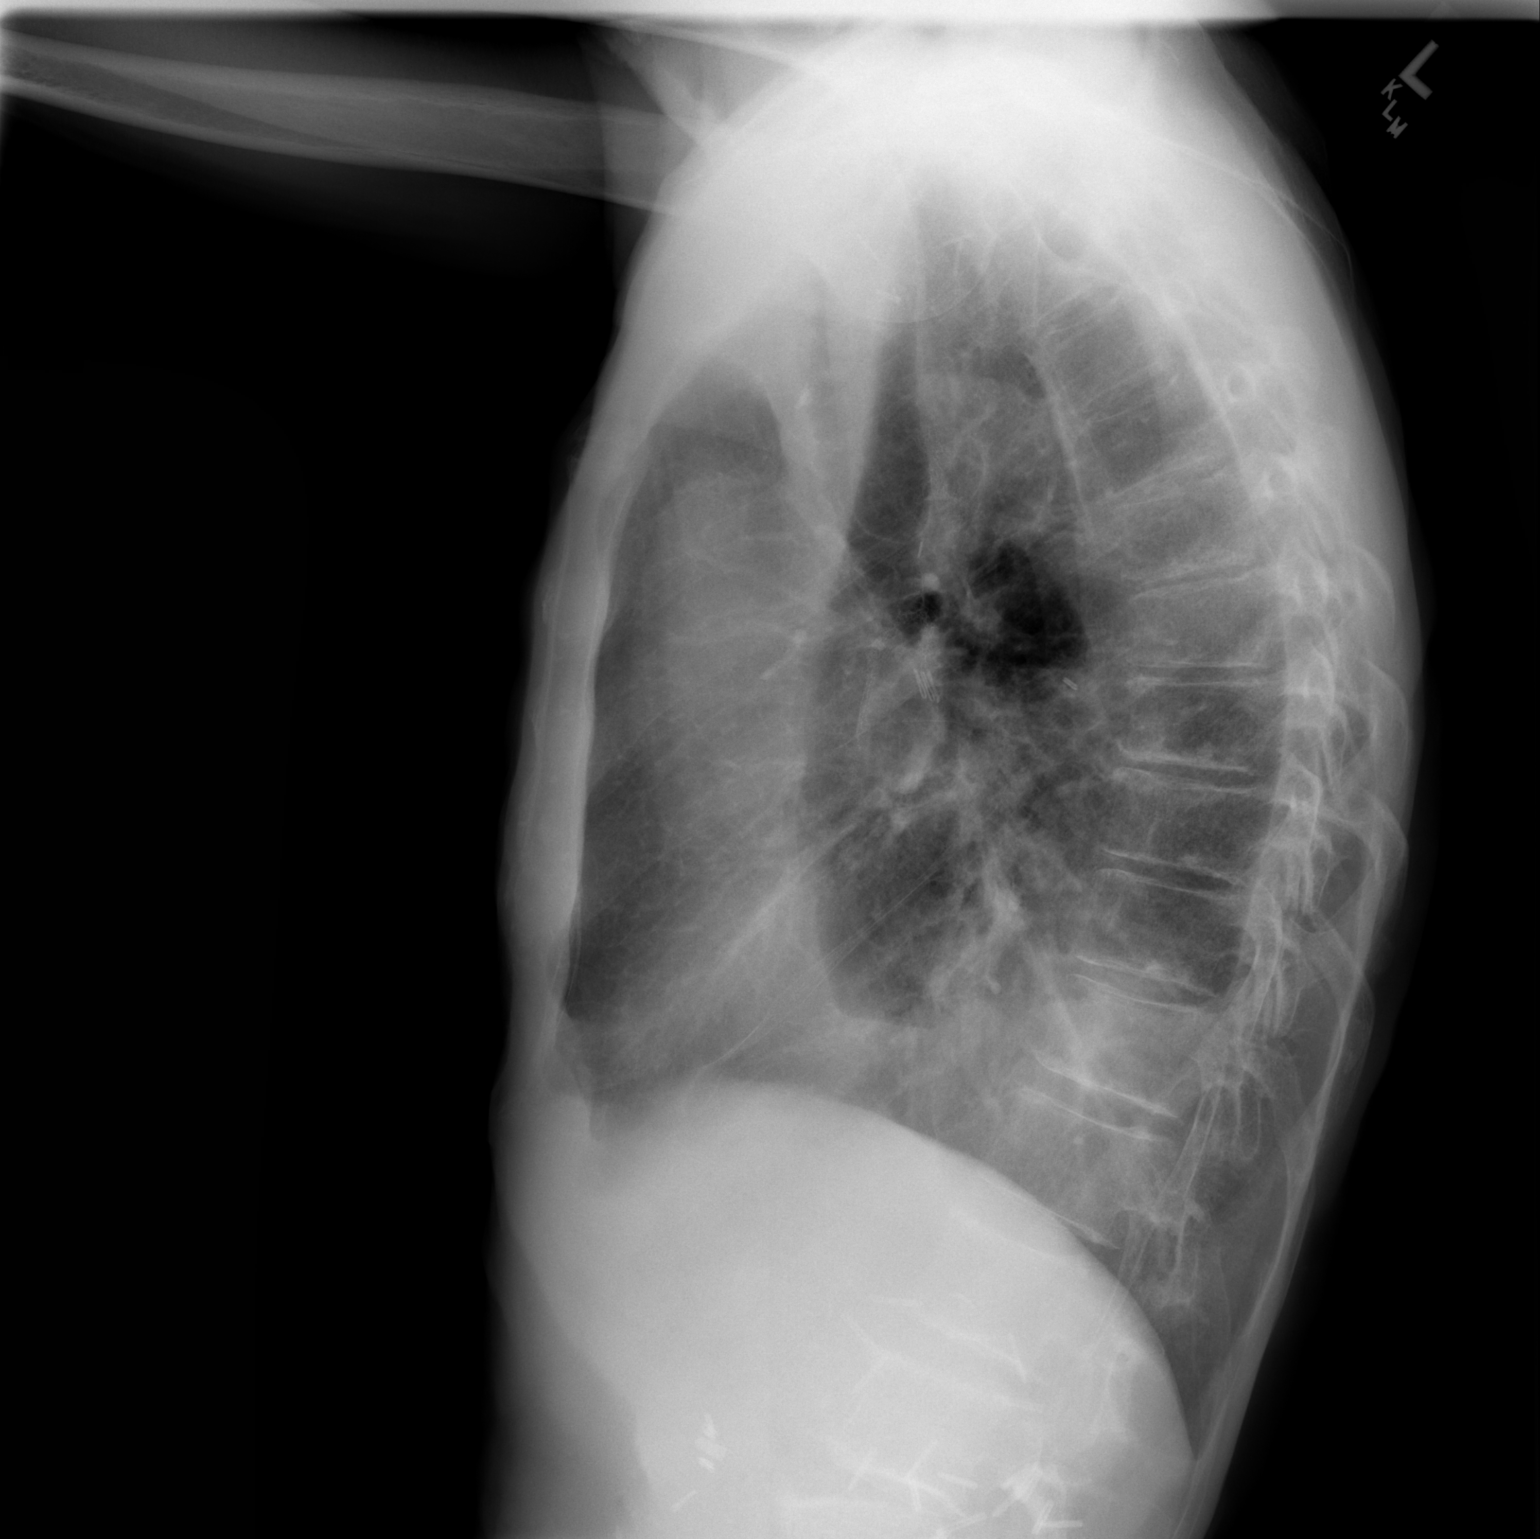

[2 of 2 positions shown; findings below may reference images not displayed]

FINDINGS: No pneumothorax seen with essentially stable small left
pleural effusion and probable linear pleural thickening at left
lung apex with stable surgical clips medial left lung apex. Lungs
are otherwise clear with normal heart size.  Thoracic aorta remains
calcified at the arch.  Mediastinum, hila, remaining pleura and
osseous structures are stable. No change in post thoracotomy right
sixth rib.
IMPRESSION: 1.  Essentially stable small left pleural effusion and probable
upper lobe pleural thickening.
2.  Stable surgical clips medial left lung apex and posterior right
sixth rib.
3.  No interval acute or metastatic disease findings.

## 2010-08-20 IMAGING — CR DG CHEST 2V
2 series · 2 of 2 positions shown · non-contrast
Comparison: 04/21/2010 and earlier.

CLINICAL DATA: 68-year-old male with history lung cancer.

CHEST - 2 VIEW

[w chest pa]
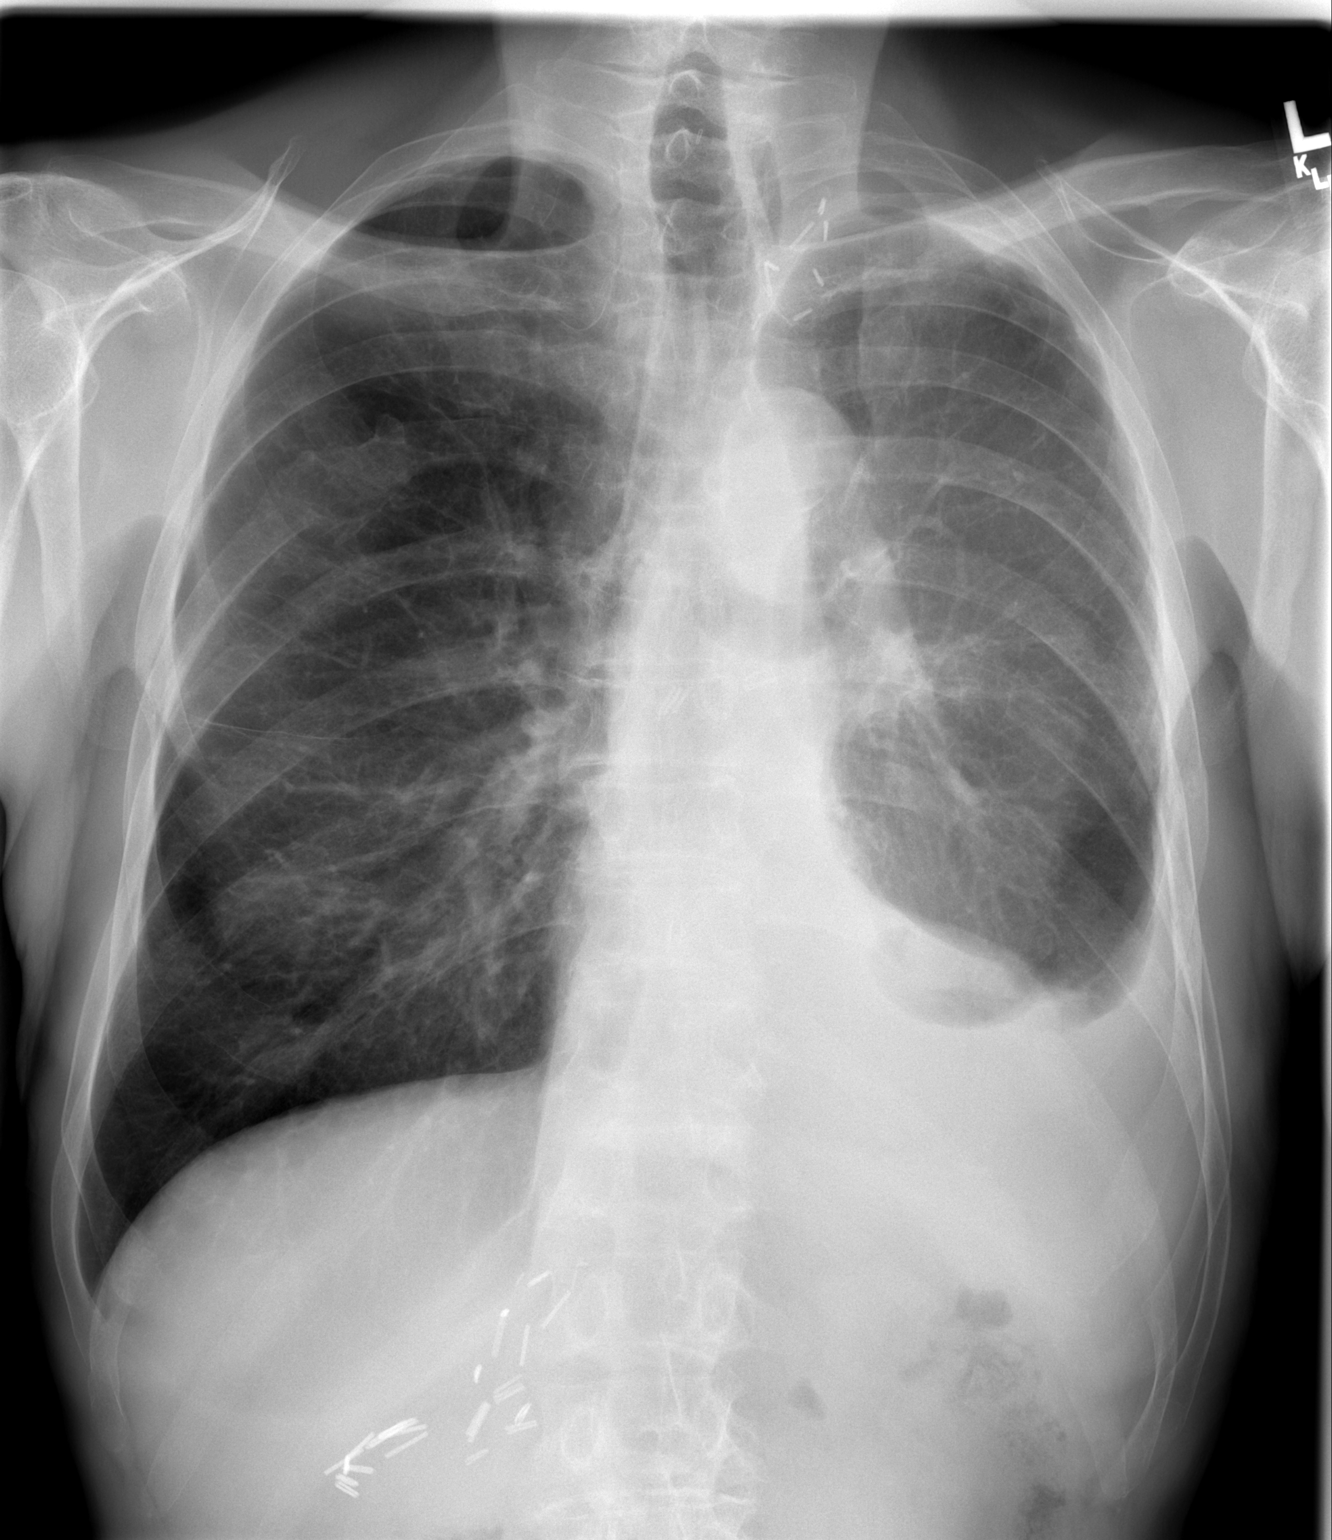

[w chest lat]
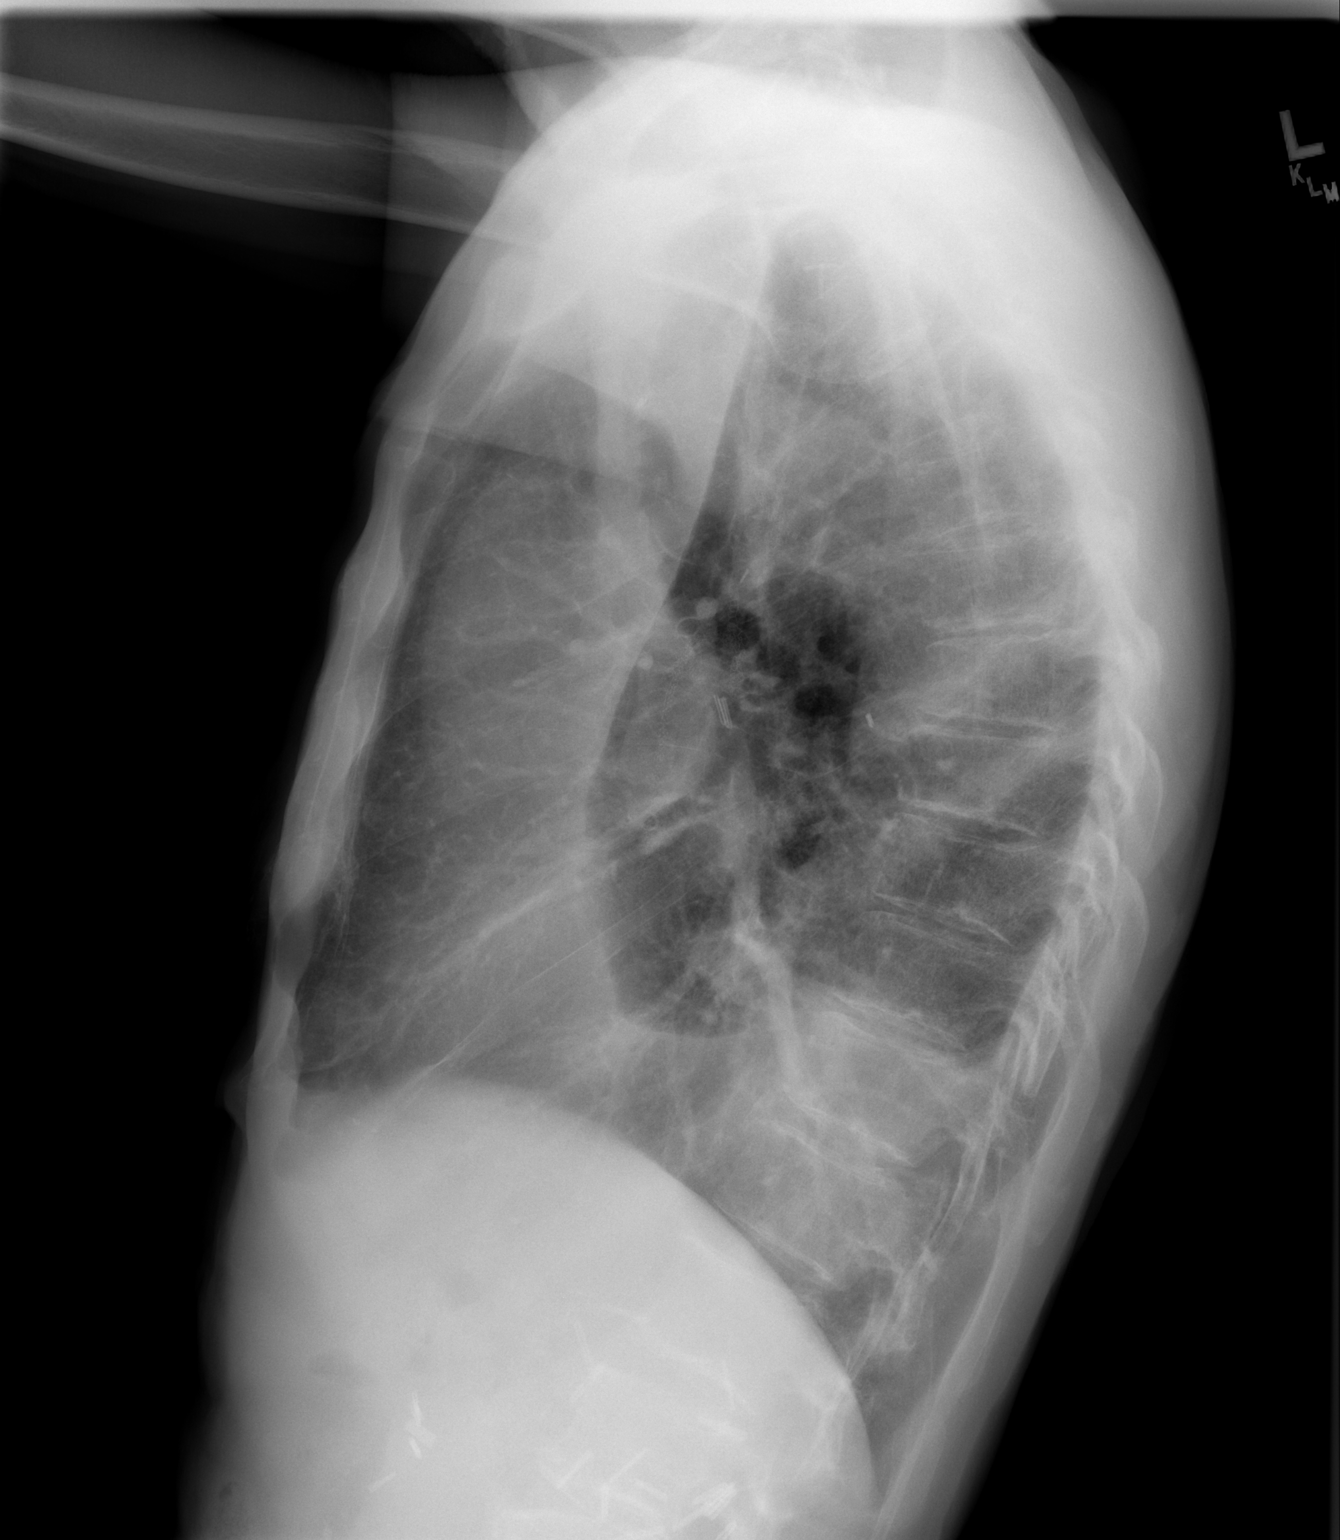

[2 of 2 positions shown; findings below may reference images not displayed]

FINDINGS: The patient has a left pleural effusion which is
partially loculated anteriorly and also layering at the left base.
This is unchanged [REDACTED].  The degree of aerated left lung is
stable.  There is a small volume of gas in the left medial lower
neck adjacent to a surgical clip of uncertain significance, but
stable.  Stable cardiac size and mediastinal contours.  Stable
right lung.  Chronic right posterior sixth rib deformity. No acute
osseous abnormality identified.
IMPRESSION: Stable postoperative appearance of the chest; partially loculated
and layering left pleural fluid as detailed above.

## 2010-09-05 ENCOUNTER — Encounter: Admission: RE | Admit: 2010-09-05 | Discharge: 2010-09-05 | Payer: Self-pay | Admitting: Thoracic Surgery

## 2010-09-05 ENCOUNTER — Ambulatory Visit: Payer: Self-pay | Admitting: Thoracic Surgery

## 2010-11-23 IMAGING — CT CT CHEST W/O CM
2 of 3 series · 15 of 30 positions shown, 17 images · non-contrast
Comparison: CT chest 01/18/2010.

CLINICAL DATA: History of left upper lobe lung cancer post
resection in January 2010 with subsequent radiation therapy.
Routine follow-up.

CT CHEST WITHOUT CONTRAST 09/05/2010:
TECHNIQUE: Multidetector CT imaging of the chest was performed
following the standard protocol without IV contrast.

[Series 3: routine chest · axial · 0.70mm/px · z∈[-286,-26]mm · 7 of 70 slices shown, 9 images]
[im 9/70  mediastinal]
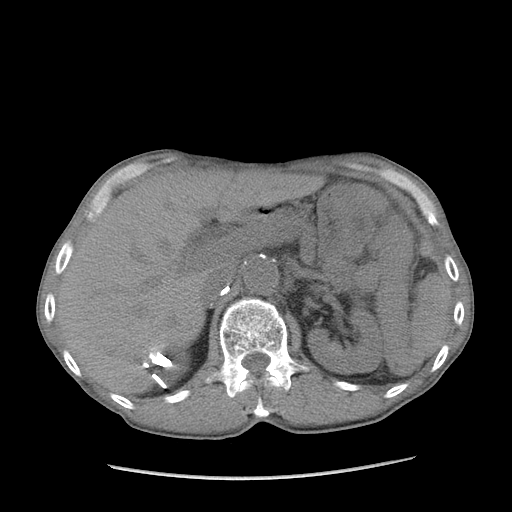
[im 9/70  lung]
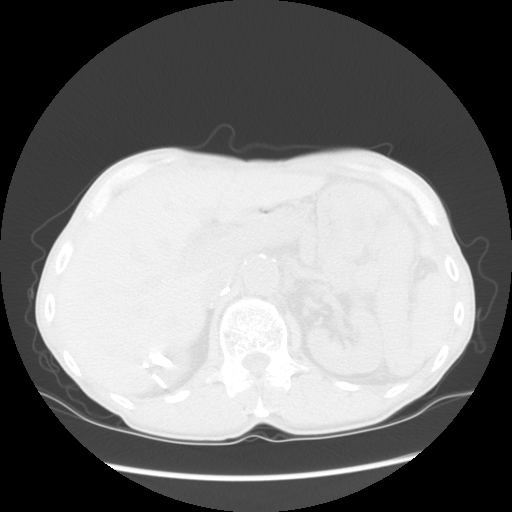
[im 18/70  lung]
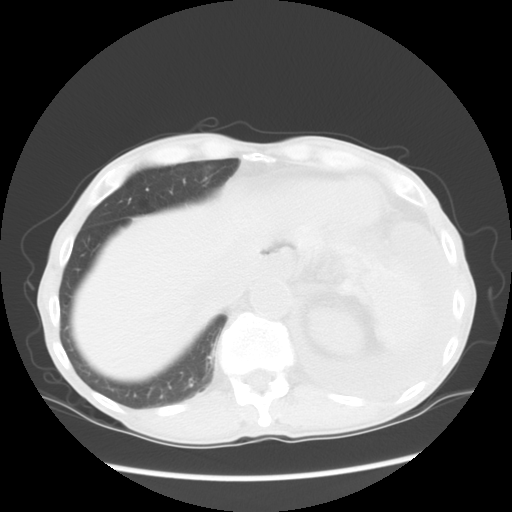
[im 26/70  lung]
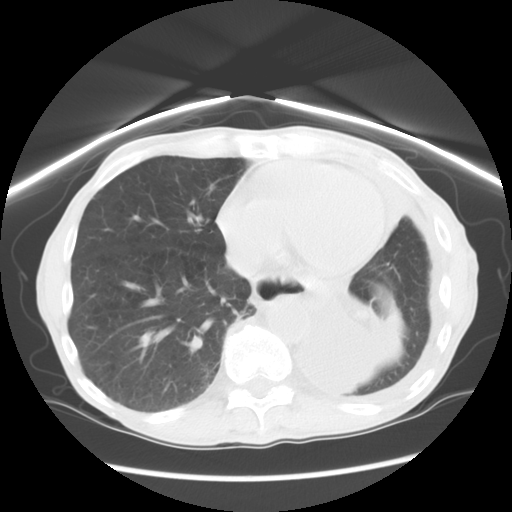
[im 35/70  lung]
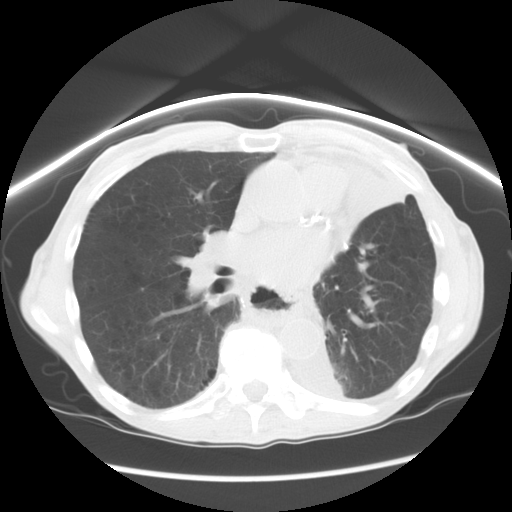
[im 44/70  mediastinal]
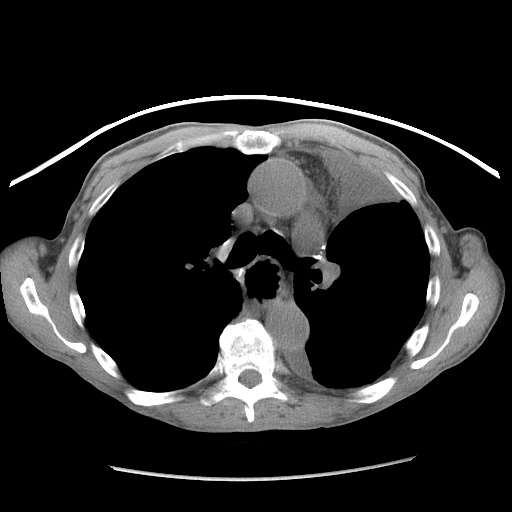
[im 44/70  lung]
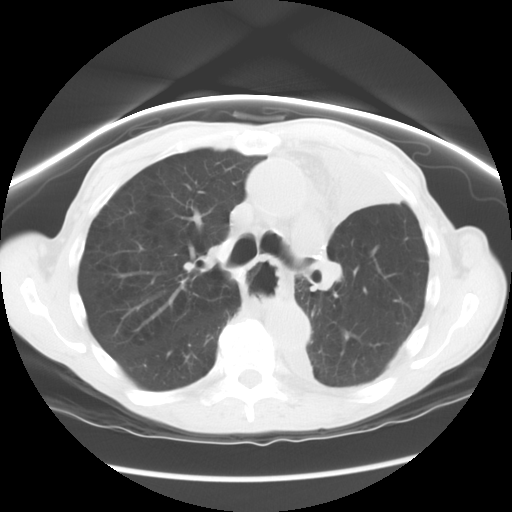
[im 52/70  lung]
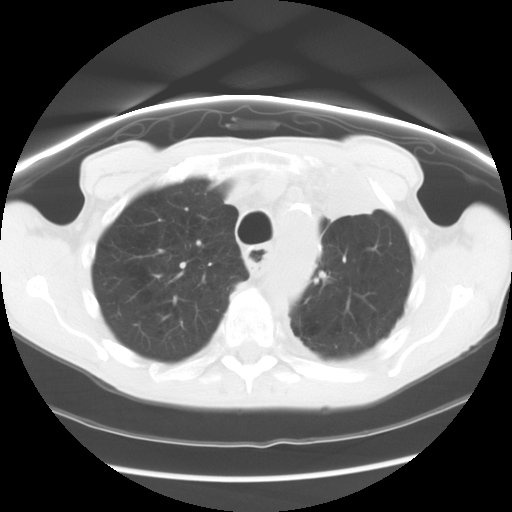
[im 61/70  lung]
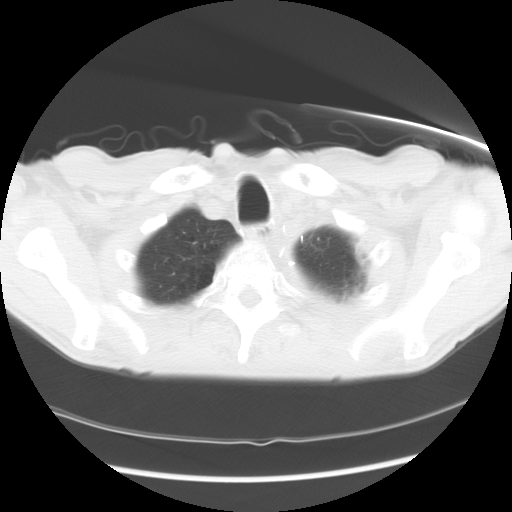

[Series 602: sagittal body · sagittal · 0.70mm/px · 8 of 145 slices shown]
[im 17/145  mediastinal]
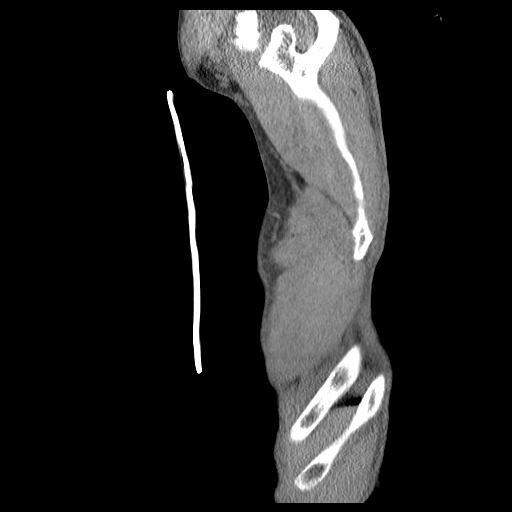
[im 33/145  mediastinal]
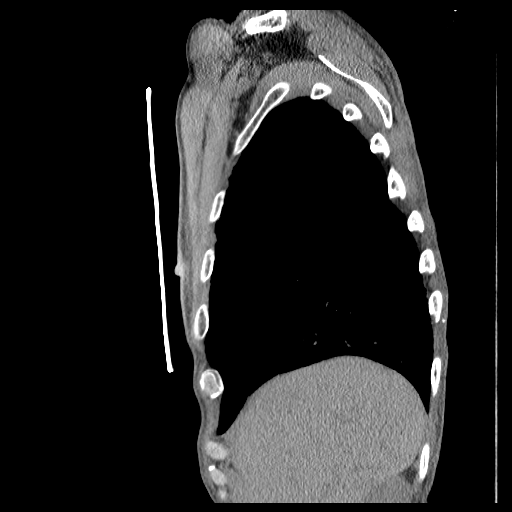
[im 49/145  mediastinal]
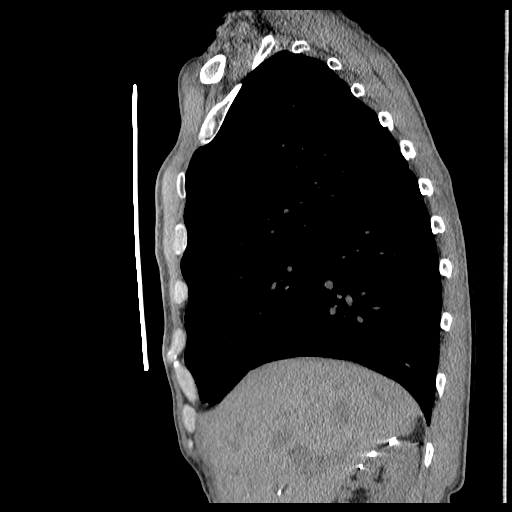
[im 65/145  mediastinal]
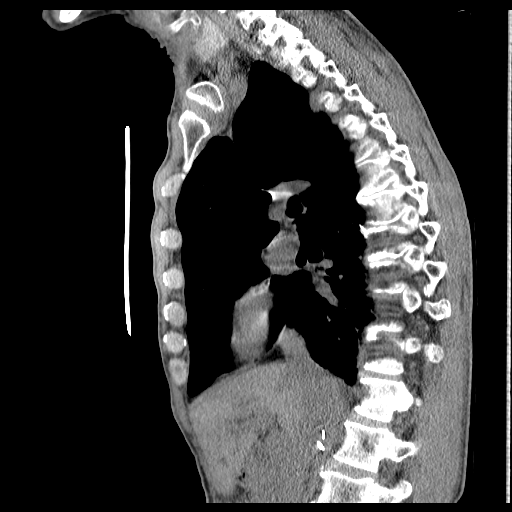
[im 81/145  mediastinal]
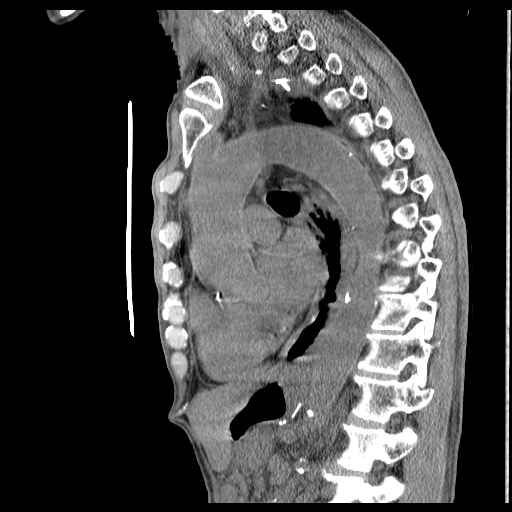
[im 97/145  mediastinal]
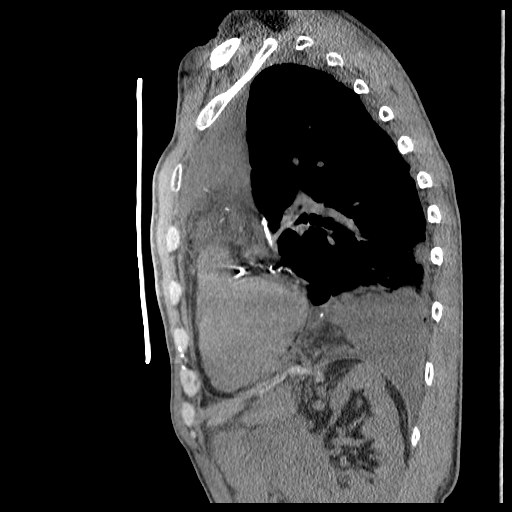
[im 113/145  mediastinal]
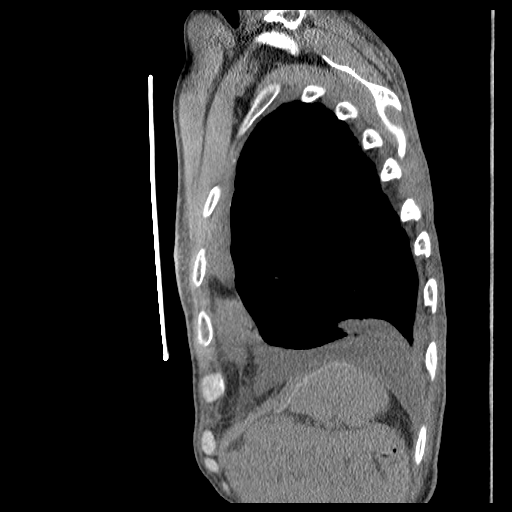
[im 129/145  mediastinal]
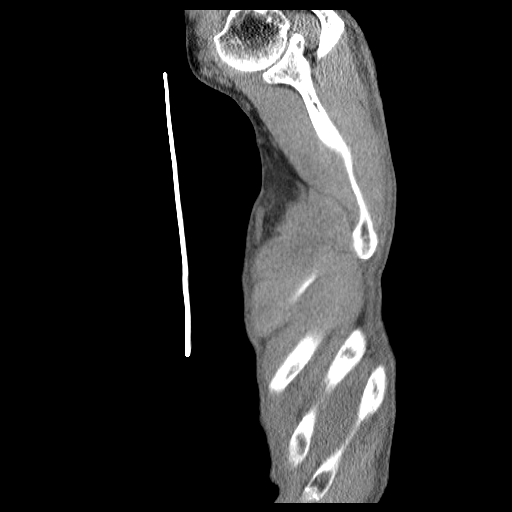

[15 of 30 positions shown; findings below may reference images not displayed]

FINDINGS: Postsurgical changes related to left upper lobectomy with
postsurgical pleuroparenchymal scarring. Interval decrease in size
of the left pleural effusion, though moderate left pleural effusion
persists, with associated mild passive atelectasis in the left
lower lobe.  Severe emphysematous changes throughout both lungs.
Bronchiectasis involving both lower lobes.  No new pulmonary
parenchymal nodules or masses.  No right pleural effusion.  Central
airways patent.

Stable upper normal heart size with extensive three-vessel coronary
artery calcification.  No pericardial effusion.  Thoracic and upper
abdominal aortic atherosclerosis with ectasia of the ascending
thoracic aorta measuring approximately 4.3 cm diameter and
abdominal aortic aneurysm identified on the final image measuring
approximately 3.1 x 3.0 cm.

Numerous sub-centimeter lymph nodes in the prevascular region of
the mediastinum.  No pathologically enlarged lymph nodes in the
mediastinum, hila, or axillae.  Tiny nodules within the visualized
thyroid gland.  Satisfactory appearance post gastric pull-through.

Gallbladder surgically absent.  Numerous surgical clips in the
right upper quadrant of the abdomen.  Bone window images
demonstrate generalized osteopenia and mild diffuse thoracic
spondylosis.
IMPRESSION: 1.  Postsurgical pleuroparenchymal scarring post left upper
lobectomy.
2.  No new pulmonary parenchymal nodules or masses.
3.  Numerous sub-centimeter lymph nodes in the prevascular region
of the mediastinum which may just be reactive.  Attention to these
nodes on subsequent imaging may be confirmatory.  No pathologically
enlarged lymph nodes in the chest or upper abdomen.
4.  Ectatic ascending thoracic aorta measuring 4.3 cm diameter,
stable.  Approximate 3.1 cm abdominal aortic aneurysm, not imaged
previously.
5.  Severe COPD/emphysema and bilateral lower lobe bronchiectasis.
Moderate left pleural effusion, decreased in size since [DATE],

## 2010-12-06 ENCOUNTER — Encounter
Admission: RE | Admit: 2010-12-06 | Discharge: 2010-12-06 | Payer: Self-pay | Source: Home / Self Care | Attending: Thoracic Surgery | Admitting: Thoracic Surgery

## 2010-12-06 ENCOUNTER — Ambulatory Visit: Payer: Self-pay | Admitting: Thoracic Surgery

## 2010-12-30 ENCOUNTER — Encounter: Payer: Self-pay | Admitting: Thoracic Surgery

## 2010-12-31 ENCOUNTER — Encounter: Payer: Self-pay | Admitting: Thoracic Surgery

## 2011-01-10 ENCOUNTER — Encounter: Payer: Self-pay | Admitting: Otolaryngology

## 2011-02-28 LAB — CBC
MCHC: 33 g/dL (ref 30.0–36.0)
MCV: 94 fL (ref 78.0–100.0)
Platelets: 316 10*3/uL (ref 150–400)
RBC: 4.08 MIL/uL — ABNORMAL LOW (ref 4.22–5.81)

## 2011-02-28 LAB — PROTIME-INR: Prothrombin Time: 13.4 seconds (ref 11.6–15.2)

## 2011-03-02 LAB — POCT I-STAT 3, ART BLOOD GAS (G3+)
Bicarbonate: 27 mEq/L — ABNORMAL HIGH (ref 20.0–24.0)
O2 Saturation: 94 %
TCO2: 28 mmol/L (ref 0–100)
pCO2 arterial: 43.8 mmHg (ref 35.0–45.0)
pH, Arterial: 7.397 (ref 7.350–7.450)
pO2, Arterial: 71 mmHg — ABNORMAL LOW (ref 80.0–100.0)

## 2011-03-02 LAB — URINALYSIS, ROUTINE W REFLEX MICROSCOPIC
Bilirubin Urine: NEGATIVE
Glucose, UA: 100 mg/dL — AB
Ketones, ur: NEGATIVE mg/dL
Specific Gravity, Urine: 1.02 (ref 1.005–1.030)
pH: 5 (ref 5.0–8.0)

## 2011-03-02 LAB — COMPREHENSIVE METABOLIC PANEL
ALT: 11 U/L (ref 0–53)
AST: 12 U/L (ref 0–37)
AST: 15 U/L (ref 0–37)
Alkaline Phosphatase: 72 U/L (ref 39–117)
CO2: 23 mEq/L (ref 19–32)
CO2: 31 mEq/L (ref 19–32)
Chloride: 111 mEq/L (ref 96–112)
Chloride: 97 mEq/L (ref 96–112)
Creatinine, Ser: 0.64 mg/dL (ref 0.4–1.5)
GFR calc Af Amer: >60 mL/min (ref >60)
GFR calc Af Amer: >60 mL/min (ref >60)
GFR calc non Af Amer: >60 mL/min (ref >60)
GFR calc non Af Amer: >60 mL/min (ref >60)
Glucose, Bld: 107 mg/dL — ABNORMAL HIGH (ref 70–99)
Potassium: 4.2 mEq/L (ref 3.5–5.1)
Sodium: 139 mEq/L (ref 135–145)
Total Bilirubin: 0.3 mg/dL (ref 0.3–1.2)
Total Bilirubin: 0.4 mg/dL (ref 0.3–1.2)

## 2011-03-02 LAB — POCT I-STAT 4, (NA,K, GLUC, HGB,HCT)
HCT: 28 % — ABNORMAL LOW (ref 39.0–52.0)
Hemoglobin: 9.5 g/dL — ABNORMAL LOW (ref 13.0–17.0)
Sodium: 140 mEq/L (ref 135–145)

## 2011-03-02 LAB — CBC
HCT: 28.9 % — ABNORMAL LOW (ref 39.0–52.0)
HCT: 29.4 % — ABNORMAL LOW (ref 39.0–52.0)
Hemoglobin: 9.7 g/dL — ABNORMAL LOW (ref 13.0–17.0)
Hemoglobin: 9.8 g/dL — ABNORMAL LOW (ref 13.0–17.0)
MCV: 93 fL (ref 78.0–100.0)
MCV: 93.3 fL (ref 78.0–100.0)
RBC: 3.1 MIL/uL — ABNORMAL LOW (ref 4.22–5.81)
RBC: 3.15 MIL/uL — ABNORMAL LOW (ref 4.22–5.81)
RBC: 3.25 MIL/uL — ABNORMAL LOW (ref 4.22–5.81)
RBC: 3.55 MIL/uL — ABNORMAL LOW (ref 4.22–5.81)
RDW: 15.2 % (ref 11.5–15.5)
WBC: 5.3 10*3/uL (ref 4.0–10.5)
WBC: 7.4 10*3/uL (ref 4.0–10.5)
WBC: 7.7 10*3/uL (ref 4.0–10.5)
WBC: 7.8 10*3/uL (ref 4.0–10.5)

## 2011-03-02 LAB — BASIC METABOLIC PANEL
BUN: 7 mg/dL (ref 6–23)
Calcium: 8 mg/dL — ABNORMAL LOW (ref 8.4–10.5)
Calcium: 9 mg/dL (ref 8.4–10.5)
Chloride: 101 mEq/L (ref 96–112)
Creatinine, Ser: 0.67 mg/dL (ref 0.4–1.5)
Creatinine, Ser: 0.78 mg/dL (ref 0.4–1.5)
GFR calc Af Amer: >60 mL/min (ref >60)
GFR calc Af Amer: >60 mL/min (ref >60)
GFR calc Af Amer: >60 mL/min (ref >60)
GFR calc non Af Amer: >60 mL/min (ref >60)
GFR calc non Af Amer: >60 mL/min (ref >60)
Glucose, Bld: 111 mg/dL — ABNORMAL HIGH (ref 70–99)
Potassium: 4.1 mEq/L (ref 3.5–5.1)
Potassium: 4.2 mEq/L (ref 3.5–5.1)
Sodium: 131 mEq/L — ABNORMAL LOW (ref 135–145)

## 2011-03-02 LAB — BODY FLUID CULTURE
Culture: NO GROWTH
Gram Stain: NONE SEEN

## 2011-03-02 LAB — BLOOD GAS, ARTERIAL
Acid-base deficit: 0.3 mmol/L (ref 0.0–2.0)
Drawn by: 206361
FIO2: 0.21 %
pCO2 arterial: 35.9 mmHg (ref 35.0–45.0)
pO2, Arterial: 112 mmHg — ABNORMAL HIGH (ref 80.0–100.0)

## 2011-03-02 LAB — TYPE AND SCREEN: Antibody Screen: NEGATIVE

## 2011-03-02 LAB — FUNGUS CULTURE W SMEAR: Fungal Smear: NONE SEEN

## 2011-03-02 LAB — AFB CULTURE WITH SMEAR (NOT AT ARMC): Acid Fast Smear: NONE SEEN

## 2011-03-14 LAB — COMPREHENSIVE METABOLIC PANEL
ALT: 15 U/L (ref 0–53)
ALT: 91 U/L — ABNORMAL HIGH (ref 0–53)
ALT: 11 U/L (ref 0–53)
ALT: 27 U/L (ref 0–53)
AST: 100 U/L — ABNORMAL HIGH (ref 0–37)
AST: 20 U/L (ref 0–37)
AST: 30 U/L (ref 0–37)
AST: 32 U/L (ref 0–37)
Albumin: 2 g/dL — ABNORMAL LOW (ref 3.5–5.2)
Albumin: 2.1 g/dL — ABNORMAL LOW (ref 3.5–5.2)
Alkaline Phosphatase: 62 U/L (ref 39–117)
Alkaline Phosphatase: 77 U/L (ref 39–117)
Alkaline Phosphatase: 68 U/L (ref 39–117)
Alkaline Phosphatase: 76 U/L (ref 39–117)
BUN: 17 mg/dL (ref 6–23)
BUN: 18 mg/dL (ref 6–23)
BUN: 50 mg/dL — ABNORMAL HIGH (ref 6–23)
BUN: 8 mg/dL (ref 6–23)
CO2: 27 mEq/L (ref 19–32)
CO2: 29 mEq/L (ref 19–32)
CO2: 22 mEq/L (ref 19–32)
CO2: 27 mEq/L (ref 19–32)
CO2: 27 mEq/L (ref 19–32)
Calcium: 8.1 mg/dL — ABNORMAL LOW (ref 8.4–10.5)
Calcium: 8.8 mg/dL (ref 8.4–10.5)
Calcium: 9 mg/dL (ref 8.4–10.5)
Calcium: 9.3 mg/dL (ref 8.4–10.5)
Chloride: 106 mEq/L (ref 96–112)
Chloride: 101 mEq/L (ref 96–112)
Chloride: 102 mEq/L (ref 96–112)
Chloride: 103 mEq/L (ref 96–112)
Creatinine, Ser: 0.64 mg/dL (ref 0.4–1.5)
Creatinine, Ser: 0.73 mg/dL (ref 0.4–1.5)
Creatinine, Ser: 0.9 mg/dL (ref 0.4–1.5)
Creatinine, Ser: 1.18 mg/dL (ref 0.4–1.5)
GFR calc Af Amer: >60 mL/min (ref >60)
GFR calc Af Amer: >60 mL/min (ref >60)
GFR calc Af Amer: >60 mL/min (ref >60)
GFR calc Af Amer: >60 mL/min (ref >60)
GFR calc non Af Amer: >60 mL/min (ref >60)
GFR calc non Af Amer: >60 mL/min (ref >60)
GFR calc non Af Amer: >60 mL/min (ref >60)
GFR calc non Af Amer: >60 mL/min (ref >60)
Glucose, Bld: 109 mg/dL — ABNORMAL HIGH (ref 70–99)
Glucose, Bld: 118 mg/dL — ABNORMAL HIGH (ref 70–99)
Glucose, Bld: 93 mg/dL (ref 70–99)
Glucose, Bld: 100 mg/dL — ABNORMAL HIGH (ref 70–99)
Glucose, Bld: 162 mg/dL — ABNORMAL HIGH (ref 70–99)
Potassium: 3.6 mEq/L (ref 3.5–5.1)
Potassium: 3.9 mEq/L (ref 3.5–5.1)
Potassium: 4.3 mEq/L (ref 3.5–5.1)
Potassium: 4.8 mEq/L (ref 3.5–5.1)
Sodium: 137 mEq/L (ref 135–145)
Sodium: 138 mEq/L (ref 135–145)
Sodium: 134 mEq/L — ABNORMAL LOW (ref 135–145)
Sodium: 138 mEq/L (ref 135–145)
Total Bilirubin: 0.4 mg/dL (ref 0.3–1.2)
Total Bilirubin: 0.2 mg/dL — ABNORMAL LOW (ref 0.3–1.2)
Total Bilirubin: 0.9 mg/dL (ref 0.3–1.2)
Total Bilirubin: 2 mg/dL — ABNORMAL HIGH (ref 0.3–1.2)
Total Protein: 4.8 g/dL — ABNORMAL LOW (ref 6.0–8.3)
Total Protein: 4.8 g/dL — ABNORMAL LOW (ref 6.0–8.3)
Total Protein: 5.5 g/dL — ABNORMAL LOW (ref 6.0–8.3)
Total Protein: 5.1 g/dL — ABNORMAL LOW (ref 6.0–8.3)
Total Protein: 6.6 g/dL (ref 6.0–8.3)

## 2011-03-14 LAB — GLUCOSE, CAPILLARY
Glucose-Capillary: 102 mg/dL — ABNORMAL HIGH (ref 70–99)
Glucose-Capillary: 103 mg/dL — ABNORMAL HIGH (ref 70–99)
Glucose-Capillary: 104 mg/dL — ABNORMAL HIGH (ref 70–99)
Glucose-Capillary: 104 mg/dL — ABNORMAL HIGH (ref 70–99)
Glucose-Capillary: 105 mg/dL — ABNORMAL HIGH (ref 70–99)
Glucose-Capillary: 107 mg/dL — ABNORMAL HIGH (ref 70–99)
Glucose-Capillary: 108 mg/dL — ABNORMAL HIGH (ref 70–99)
Glucose-Capillary: 108 mg/dL — ABNORMAL HIGH (ref 70–99)
Glucose-Capillary: 110 mg/dL — ABNORMAL HIGH (ref 70–99)
Glucose-Capillary: 111 mg/dL — ABNORMAL HIGH (ref 70–99)
Glucose-Capillary: 112 mg/dL — ABNORMAL HIGH (ref 70–99)
Glucose-Capillary: 115 mg/dL — ABNORMAL HIGH (ref 70–99)
Glucose-Capillary: 117 mg/dL — ABNORMAL HIGH (ref 70–99)
Glucose-Capillary: 117 mg/dL — ABNORMAL HIGH (ref 70–99)
Glucose-Capillary: 124 mg/dL — ABNORMAL HIGH (ref 70–99)
Glucose-Capillary: 126 mg/dL — ABNORMAL HIGH (ref 70–99)
Glucose-Capillary: 127 mg/dL — ABNORMAL HIGH (ref 70–99)
Glucose-Capillary: 129 mg/dL — ABNORMAL HIGH (ref 70–99)
Glucose-Capillary: 133 mg/dL — ABNORMAL HIGH (ref 70–99)
Glucose-Capillary: 134 mg/dL — ABNORMAL HIGH (ref 70–99)
Glucose-Capillary: 135 mg/dL — ABNORMAL HIGH (ref 70–99)
Glucose-Capillary: 136 mg/dL — ABNORMAL HIGH (ref 70–99)
Glucose-Capillary: 137 mg/dL — ABNORMAL HIGH (ref 70–99)
Glucose-Capillary: 137 mg/dL — ABNORMAL HIGH (ref 70–99)
Glucose-Capillary: 140 mg/dL — ABNORMAL HIGH (ref 70–99)
Glucose-Capillary: 141 mg/dL — ABNORMAL HIGH (ref 70–99)
Glucose-Capillary: 155 mg/dL — ABNORMAL HIGH (ref 70–99)
Glucose-Capillary: 158 mg/dL — ABNORMAL HIGH (ref 70–99)
Glucose-Capillary: 161 mg/dL — ABNORMAL HIGH (ref 70–99)
Glucose-Capillary: 86 mg/dL (ref 70–99)
Glucose-Capillary: 88 mg/dL (ref 70–99)
Glucose-Capillary: 91 mg/dL (ref 70–99)
Glucose-Capillary: 92 mg/dL (ref 70–99)
Glucose-Capillary: 94 mg/dL (ref 70–99)
Glucose-Capillary: 95 mg/dL (ref 70–99)
Glucose-Capillary: 97 mg/dL (ref 70–99)
Glucose-Capillary: 98 mg/dL (ref 70–99)
Glucose-Capillary: 100 mg/dL — ABNORMAL HIGH (ref 70–99)
Glucose-Capillary: 104 mg/dL — ABNORMAL HIGH (ref 70–99)
Glucose-Capillary: 107 mg/dL — ABNORMAL HIGH (ref 70–99)
Glucose-Capillary: 110 mg/dL — ABNORMAL HIGH (ref 70–99)
Glucose-Capillary: 111 mg/dL — ABNORMAL HIGH (ref 70–99)
Glucose-Capillary: 114 mg/dL — ABNORMAL HIGH (ref 70–99)
Glucose-Capillary: 119 mg/dL — ABNORMAL HIGH (ref 70–99)
Glucose-Capillary: 121 mg/dL — ABNORMAL HIGH (ref 70–99)
Glucose-Capillary: 133 mg/dL — ABNORMAL HIGH (ref 70–99)
Glucose-Capillary: 82 mg/dL (ref 70–99)

## 2011-03-14 LAB — CBC
HCT: 29.6 % — ABNORMAL LOW (ref 39.0–52.0)
HCT: 30.5 % — ABNORMAL LOW (ref 39.0–52.0)
HCT: 32.9 % — ABNORMAL LOW (ref 39.0–52.0)
HCT: 33.7 % — ABNORMAL LOW (ref 39.0–52.0)
HCT: 46.1 % (ref 39.0–52.0)
Hemoglobin: 10.4 g/dL — ABNORMAL LOW (ref 13.0–17.0)
Hemoglobin: 11 g/dL — ABNORMAL LOW (ref 13.0–17.0)
Hemoglobin: 11.3 g/dL — ABNORMAL LOW (ref 13.0–17.0)
Hemoglobin: 12.7 g/dL — ABNORMAL LOW (ref 13.0–17.0)
MCHC: 33.9 g/dL (ref 30.0–36.0)
MCHC: 33.9 g/dL (ref 30.0–36.0)
MCHC: 34 g/dL (ref 30.0–36.0)
MCHC: 33.2 g/dL (ref 30.0–36.0)
MCHC: 33.2 g/dL (ref 30.0–36.0)
MCHC: 33.6 g/dL (ref 30.0–36.0)
MCHC: 33.6 g/dL (ref 30.0–36.0)
MCHC: 33.6 g/dL (ref 30.0–36.0)
MCHC: 33.7 g/dL (ref 30.0–36.0)
MCHC: 33.9 g/dL (ref 30.0–36.0)
MCV: 92.2 fL (ref 78.0–100.0)
MCV: 92.6 fL (ref 78.0–100.0)
MCV: 93 fL (ref 78.0–100.0)
MCV: 93.3 fL (ref 78.0–100.0)
MCV: 94.9 fL (ref 78.0–100.0)
MCV: 95.7 fL (ref 78.0–100.0)
MCV: 96.3 fL (ref 78.0–100.0)
Platelets: 305 10*3/uL (ref 150–400)
Platelets: 325 10*3/uL (ref 150–400)
Platelets: 333 10*3/uL (ref 150–400)
Platelets: 237 10*3/uL (ref 150–400)
Platelets: 315 10*3/uL (ref 150–400)
Platelets: 358 10*3/uL (ref 150–400)
Platelets: 373 10*3/uL (ref 150–400)
RBC: 3.21 MIL/uL — ABNORMAL LOW (ref 4.22–5.81)
RBC: 3.31 MIL/uL — ABNORMAL LOW (ref 4.22–5.81)
RBC: 3.42 MIL/uL — ABNORMAL LOW (ref 4.22–5.81)
RBC: 3.61 MIL/uL — ABNORMAL LOW (ref 4.22–5.81)
RBC: 3.54 MIL/uL — ABNORMAL LOW (ref 4.22–5.81)
RBC: 3.65 MIL/uL — ABNORMAL LOW (ref 4.22–5.81)
RBC: 3.73 MIL/uL — ABNORMAL LOW (ref 4.22–5.81)
RBC: 3.97 MIL/uL — ABNORMAL LOW (ref 4.22–5.81)
RBC: 4.53 MIL/uL (ref 4.22–5.81)
RBC: 4.82 MIL/uL (ref 4.22–5.81)
RDW: 15.2 % (ref 11.5–15.5)
RDW: 15.3 % (ref 11.5–15.5)
RDW: 15.6 % — ABNORMAL HIGH (ref 11.5–15.5)
RDW: 16.2 % — ABNORMAL HIGH (ref 11.5–15.5)
RDW: 14.2 % (ref 11.5–15.5)
RDW: 14.4 % (ref 11.5–15.5)
RDW: 14.4 % (ref 11.5–15.5)
RDW: 14.5 % (ref 11.5–15.5)
WBC: 10.3 10*3/uL (ref 4.0–10.5)
WBC: 11.7 10*3/uL — ABNORMAL HIGH (ref 4.0–10.5)
WBC: 5.4 10*3/uL (ref 4.0–10.5)
WBC: 6.5 10*3/uL (ref 4.0–10.5)
WBC: 6.6 10*3/uL (ref 4.0–10.5)
WBC: 8.6 10*3/uL (ref 4.0–10.5)

## 2011-03-14 LAB — DIFFERENTIAL
Basophils Absolute: 0 10*3/uL (ref 0.0–0.1)
Basophils Absolute: 0 10*3/uL (ref 0.0–0.1)
Basophils Relative: 0 % (ref 0–1)
Basophils Relative: 0 % (ref 0–1)
Basophils Relative: 0 % (ref 0–1)
Basophils Relative: 0 % (ref 0–1)
Blasts: 0 %
Eosinophils Absolute: 0.1 10*3/uL (ref 0.0–0.7)
Eosinophils Absolute: 0.1 10*3/uL (ref 0.0–0.7)
Eosinophils Absolute: 0.1 10*3/uL (ref 0.0–0.7)
Eosinophils Relative: 1 % (ref 0–5)
Lymphocytes Relative: 9 % — ABNORMAL LOW (ref 12–46)
Lymphocytes Relative: 18 % (ref 12–46)
Lymphocytes Relative: 36 % (ref 12–46)
Lymphs Abs: 0.6 10*3/uL — ABNORMAL LOW (ref 0.7–4.0)
Lymphs Abs: 1.2 10*3/uL (ref 0.7–4.0)
Lymphs Abs: 2.4 10*3/uL (ref 0.7–4.0)
Monocytes Absolute: 0.8 10*3/uL (ref 0.1–1.0)
Monocytes Relative: 12 % (ref 3–12)
Monocytes Relative: 7 % (ref 3–12)
Monocytes Relative: 12 % (ref 3–12)
Monocytes Relative: 17 % — ABNORMAL HIGH (ref 3–12)
Monocytes Relative: 8 % (ref 3–12)
Neutro Abs: 5 10*3/uL (ref 1.7–7.7)
Neutro Abs: 3.2 10*3/uL (ref 1.7–7.7)
Neutro Abs: 4.1 10*3/uL (ref 1.7–7.7)
Neutrophils Relative %: 73 % (ref 43–77)
Neutrophils Relative %: 79 % — ABNORMAL HIGH (ref 43–77)
Neutrophils Relative %: 49 % (ref 43–77)
Neutrophils Relative %: 65 % (ref 43–77)
Promyelocytes Absolute: 0 %
nRBC: 0 /100 WBC

## 2011-03-14 LAB — BASIC METABOLIC PANEL
BUN: 19 mg/dL (ref 6–23)
BUN: 10 mg/dL (ref 6–23)
BUN: 23 mg/dL (ref 6–23)
BUN: 31 mg/dL — ABNORMAL HIGH (ref 6–23)
CO2: 30 mEq/L (ref 19–32)
CO2: 23 mEq/L (ref 19–32)
CO2: 27 mEq/L (ref 19–32)
CO2: 28 mEq/L (ref 19–32)
Calcium: 8.1 mg/dL — ABNORMAL LOW (ref 8.4–10.5)
Calcium: 7.7 mg/dL — ABNORMAL LOW (ref 8.4–10.5)
Calcium: 7.8 mg/dL — ABNORMAL LOW (ref 8.4–10.5)
Calcium: 8.3 mg/dL — ABNORMAL LOW (ref 8.4–10.5)
Chloride: 103 mEq/L (ref 96–112)
Chloride: 105 mEq/L (ref 96–112)
Chloride: 97 mEq/L (ref 96–112)
Chloride: 99 mEq/L (ref 96–112)
Creatinine, Ser: 0.61 mg/dL (ref 0.4–1.5)
Creatinine, Ser: 0.61 mg/dL (ref 0.4–1.5)
Creatinine, Ser: 0.84 mg/dL (ref 0.4–1.5)
Creatinine, Ser: 0.9 mg/dL (ref 0.4–1.5)
Creatinine, Ser: 0.97 mg/dL (ref 0.4–1.5)
Creatinine, Ser: 1.12 mg/dL (ref 0.4–1.5)
GFR calc Af Amer: >60 mL/min (ref >60)
GFR calc Af Amer: >60 mL/min (ref >60)
GFR calc Af Amer: >60 mL/min (ref >60)
GFR calc non Af Amer: >60 mL/min (ref >60)
GFR calc non Af Amer: >60 mL/min (ref >60)
Glucose, Bld: 145 mg/dL — ABNORMAL HIGH (ref 70–99)
Glucose, Bld: 113 mg/dL — ABNORMAL HIGH (ref 70–99)
Glucose, Bld: 169 mg/dL — ABNORMAL HIGH (ref 70–99)
Glucose, Bld: 98 mg/dL (ref 70–99)
Potassium: 4.4 mEq/L (ref 3.5–5.1)
Potassium: 4.7 mEq/L (ref 3.5–5.1)
Sodium: 131 mEq/L — ABNORMAL LOW (ref 135–145)

## 2011-03-14 LAB — ABO/RH: ABO/RH(D): A POS

## 2011-03-14 LAB — URINE CULTURE: Colony Count: 9000

## 2011-03-14 LAB — MAGNESIUM
Magnesium: 2 mg/dL (ref 1.5–2.5)
Magnesium: 2.1 mg/dL (ref 1.5–2.5)
Magnesium: 2.4 mg/dL (ref 1.5–2.5)

## 2011-03-14 LAB — TYPE AND SCREEN

## 2011-03-14 LAB — PHOSPHORUS
Phosphorus: 3.5 mg/dL (ref 2.3–4.6)
Phosphorus: 3.7 mg/dL (ref 2.3–4.6)
Phosphorus: 2.6 mg/dL (ref 2.3–4.6)

## 2011-03-14 LAB — POCT I-STAT 7, (LYTES, BLD GAS, ICA,H+H)
Bicarbonate: 25.1 mEq/L — ABNORMAL HIGH (ref 20.0–24.0)
O2 Saturation: 100 %
Sodium: 137 mEq/L (ref 135–145)
pCO2 arterial: 42.1 mmHg (ref 35.0–45.0)
pO2, Arterial: 367 mmHg — ABNORMAL HIGH (ref 80.0–100.0)

## 2011-03-14 LAB — URINALYSIS, ROUTINE W REFLEX MICROSCOPIC
Glucose, UA: NEGATIVE mg/dL
Hgb urine dipstick: NEGATIVE
Ketones, ur: NEGATIVE mg/dL
Leukocytes, UA: NEGATIVE
Protein, ur: NEGATIVE mg/dL
pH: 5 (ref 5.0–8.0)

## 2011-03-14 LAB — LIPASE, BLOOD: Lipase: <10 U/L — ABNORMAL LOW (ref 11–59)

## 2011-03-14 LAB — URINE MICROSCOPIC-ADD ON

## 2011-03-14 LAB — CHOLESTEROL, TOTAL: Cholesterol: 100 mg/dL (ref 0–200)

## 2011-03-14 LAB — PREALBUMIN
Prealbumin: 10 mg/dL — ABNORMAL LOW (ref 18.0–45.0)
Prealbumin: 9.2 mg/dL — ABNORMAL LOW (ref 18.0–45.0)

## 2011-03-18 LAB — BASIC METABOLIC PANEL
CO2: 25 mEq/L (ref 19–32)
CO2: 27 mEq/L (ref 19–32)
Calcium: 8.4 mg/dL (ref 8.4–10.5)
Calcium: 8.9 mg/dL (ref 8.4–10.5)
Chloride: 106 mEq/L (ref 96–112)
Creatinine, Ser: 0.72 mg/dL (ref 0.4–1.5)
GFR calc Af Amer: >60 mL/min (ref >60)
GFR calc Af Amer: >60 mL/min (ref >60)
GFR calc non Af Amer: >60 mL/min (ref >60)
Glucose, Bld: 105 mg/dL — ABNORMAL HIGH (ref 70–99)
Potassium: 4.2 mEq/L (ref 3.5–5.1)
Sodium: 135 mEq/L (ref 135–145)
Sodium: 138 mEq/L (ref 135–145)

## 2011-03-18 LAB — CULTURE, RESPIRATORY W GRAM STAIN

## 2011-03-18 LAB — CBC
Hemoglobin: 11.7 g/dL — ABNORMAL LOW (ref 13.0–17.0)
RBC: 3.62 MIL/uL — ABNORMAL LOW (ref 4.22–5.81)
WBC: 7.4 10*3/uL (ref 4.0–10.5)

## 2011-03-18 LAB — EXPECTORATED SPUTUM ASSESSMENT W GRAM STAIN, RFLX TO RESP C

## 2011-03-19 LAB — CBC
HCT: 38.8 % — ABNORMAL LOW (ref 39.0–52.0)
MCHC: 33.2 g/dL (ref 30.0–36.0)
MCV: 95.3 fL (ref 78.0–100.0)
Platelets: 297 10*3/uL (ref 150–400)
RBC: 4.07 MIL/uL — ABNORMAL LOW (ref 4.22–5.81)
WBC: 11.3 10*3/uL — ABNORMAL HIGH (ref 4.0–10.5)

## 2011-03-19 LAB — BASIC METABOLIC PANEL
BUN: 19 mg/dL (ref 6–23)
CO2: 29 mEq/L (ref 19–32)
Chloride: 102 mEq/L (ref 96–112)
Creatinine, Ser: 0.89 mg/dL (ref 0.4–1.5)
Potassium: 3.4 mEq/L — ABNORMAL LOW (ref 3.5–5.1)

## 2011-03-19 LAB — STREP PNEUMONIAE URINARY ANTIGEN: Strep Pneumo Urinary Antigen: POSITIVE — AB

## 2011-03-19 LAB — HEMOGLOBIN A1C
Hgb A1c MFr Bld: 6.2 % — ABNORMAL HIGH (ref 4.6–6.1)
Mean Plasma Glucose: 131 mg/dL

## 2011-03-19 LAB — CULTURE, BLOOD (ROUTINE X 2)

## 2011-03-19 LAB — DIFFERENTIAL
Basophils Relative: 1 % (ref 0–1)
Eosinophils Absolute: 0.1 10*3/uL (ref 0.0–0.7)
Eosinophils Relative: 1 % (ref 0–5)
Lymphs Abs: 1 10*3/uL (ref 0.7–4.0)
Monocytes Relative: 6 % (ref 3–12)

## 2011-04-12 ENCOUNTER — Other Ambulatory Visit: Payer: Self-pay | Admitting: Thoracic Surgery

## 2011-04-12 DIAGNOSIS — C159 Malignant neoplasm of esophagus, unspecified: Secondary | ICD-10-CM

## 2011-04-24 NOTE — Letter (Signed)
October 12, 2009   Antony Madura, MD  [phone]  [place], [place]  [address]   Re:  [place], [place]                 DOB:  [birth year]   Dear Dr. Su Hilt:   I saw the patient back today.  His weight was 129 pounds, which is down  from 133 from his previous time.  He seems to continue to lose some  weight.  He was having some mid abdominal pain with left chest pain.  Chest x-ray showed no really changes.  He has had no nausea and  vomiting.  His abdomen was nontender and bowel sounds were normal.  I  told him if the intermittent pain continues, then he might want to check  with you.  He was afebrile.  His blood pressure was 104/81, pulse of  100, respirations 18, and sats were 98%.  I will see him back again in 3  months and we will repeat a CT scan on him.   Sincerely,   Ines Bloomer, M.D.  Electronically Signed   DPB/MEDQ  D:  10/12/2009  T:  10/13/2009  Job:  [id number]

## 2011-04-24 NOTE — H&P (Signed)
Glen Green, Glen Green NO.:  [id number]   MEDICAL RECORD NO.:  [id number]          PATIENT TYPE:  INP   LOCATION:  1331                         FACILITY:  [facility]   PHYSICIAN:  Lonia Blood, M.D.      DATE OF BIRTH:  Oct 28, 1941   DATE OF ADMISSION:  08/15/2007  DATE OF DISCHARGE:                              HISTORY & PHYSICAL   PRIMARY CARE PHYSICIAN:  Burton Apley, M.D.   ONCOLOGIST:  Arlan Organ, M.D.   PRESENTING COMPLAINT:  Abdominal pain, nausea, and vomiting.   HISTORY OF PRESENT ILLNESS:  The patient is a 70 year old gentleman with  a history of esophageal cancer in 2004, status post surgery, chemo, and  radiation.  The patient has been in remission since 2005.  He has been  doing alright and was actually playing golf this week.  He started  having abdominal pain and today it got worse and he started having  nausea and vomiting.  He denied any diarrhea.  He was unable to keep  anything down.   The patient was brought into the emergency room where he was seen by the  emergency room physician.  Workup done showed what appeared to be  recurrence of his cancer or at least metastatic lesions in the  peritoneum.  Subsequently we have been called to admit the patient for  further management.  He is awake, alert, and in obvious distress at this  point.   PAST MEDICAL HISTORY:  His past medical history is significant for  recurrent esophageal cancer, gastroesophageal reflux disease,  dyslipidemia, hypothyroidism, status post cerebral aneurysm repair by  Dr. Penelope Galas, status post cholecystectomy, status post Port-A-Cath  placement with concomitant sepsis in 05/05.   ALLERGIES:  The patient has no known drug allergies   MEDICATIONS:  Currently on levothyroxine, Lipitor, Prilosec, and  aspirin.   SOCIAL HISTORY:  The patient is married and lives with his wife.  The  patient denied any tobacco or alcohol use, no IV drug use.   FAMILY HISTORY:  The family  history is noncontributory due to the  patient's age.   REVIEW OF SYSTEMS:  The 12 point review of systems is essentially as per  HPI.   PHYSICAL EXAMINATION:  VITAL SIGNS:  On his examination temperature was  98.2, blood pressure 113/73, pulse 77 to 104, respiratory rate 20, and  sats 95% on room air.  GENERAL:  He is awake, alert, oriented, in mild distress due to pain.  HEENT:  PERL.  EOMI.  No jaundice.  No pallor.  NECK:  His neck is supple.  No JVD, no lymphadenopathy.  RESPIRATORY:  Good air entry bilaterally.  No wheezes or rales.  CARDIOVASCULAR:  S1 and S2, no murmurs.  ABDOMEN:  The abdomen is firm, slightly distended, diffusely tender but  more in the periumbilical area, no guarding, no rebound tenderness.  Positive bowel sounds.  EXTREMITIES:  No edema, cyanosis, or clubbing.   LABORATORY STUDIES:  Sodium 137, potassium 4.4, chloride 103, CO2 26,  glucose 122, BUN 19, creatinine 1.0, and calcium 9.7.  White count is  10.1, hemoglobin 14, platelet count 267 with a left shift, ANC 8.4.  LFTs are essentially normal.  PT 13.8 and INR 1.0.  Urinalysis showed  amber, cloudy urine with small bilirubin, trace ketones, negative  nitrite and negative leukocyte esterase.  CT of the abdomen and pelvis  showed new light peritoneal thickening along the inferior surface of the  left hemidiaphragm or hernia, worrisome for metastasis.  CT of the  pelvis showed no acute pelvic process.   ASSESSMENT:  This is a 70 year old gentleman with a prior history of  esophageal cancer who was in remission but now presenting with abdominal  pain, nausea, vomiting, and obvious intraperitoneal lesions consistent  with metastasis.   PLAN:  1. Abdominal pain, nausea, and vomiting:  Will admit the patient for      supportive therapy including pain control and control of his      nausea.  I will keep him virtually NPO except for ice chips and      depending on how he does will advance his diet.  In  the meantime we      will get oncology back to see the patient.  Further treatment and      further studies could be planned including possible surgical      biopsy.  2. Gastroesophageal reflux disease:  We will continue his PPI on the      patient.  3. Dyslipidemia:  We will hold his statins until he is able to restart      eating.  4. Hypothyroidism:  I will check TSH and depending on that will decide      whether to give IV levothyroxine or wait until his oral intake      resumes.  5. Further treatment will depend on decision by oncology.      Lonia Blood, M.D.  Electronically Signed     LG/MEDQ  D:  08/15/2007  T:  08/16/2007  Job:  [id number]   cc:   Lonia Blood, M.D.   Antony Madura, M.D.  Fax: [phone]   Rose Phi. Myna Hidalgo, M.D.  Fax: [phone]

## 2011-04-24 NOTE — Letter (Signed)
February 07, 2010   Rose Phi. Myna Hidalgo, M.D.  [address]  [place], [place] [address].   Re:  Glen Green, Glen Green                 DOB:  27-Feb-1941   Dear [name],   I saw this patient back today after his needle biopsy showed  adenocarcinoma.  The immunostains are inconclusive whether this is  primary lung or esophageal, but they leaned toward this to be primary  lung.  I have discussed the case with he and his family today, and since  his pulmonary function tests were satisfactory, I have recommended that  he have a left VATS, either a left upper lobe segmentectomy or left  upper lobectomy with node dissections.  Plan to do this on February 24, 2010 at [facility].  We will also present him in the cancer  conference to get everybody's intake on this.  I appreciate the  opportunity of seeing the patient.   Ines Bloomer, M.D.  Electronically Signed   DPB/MEDQ  D:  02/07/2010  T:  02/08/2010  Job:  [id number]   cc:   Anselm Pancoast. Zachery Dakins, M.D.

## 2011-04-24 NOTE — Letter (Signed)
December 01, 2008   Rose Phi. Myna Hidalgo, MD  [address] [facility]  [place] [address]   Re:  NATASHA, PAULSON                 DOB:  1941/03/10   Dear [name],   I saw the patient back for followup today.  His blood pressure was  117/79, pulse 86, respirations 18, and sats were 99%.  His weight was  stable at 139 pounds, which is down from 146, but otherwise, he has been  eating well.  CT scan of the chest and abdomen showed no evidence of  recurrence of his cancer, now over 5 years since we did a surgery.  Because he had the recurrence in his adrenal gland in the past, I think  we need to probably follow him for at least 1-2 more years.  I will see  him back in a year at this time with a CT scan of the chest and abdomen.   Ines Bloomer, M.D.  Electronically Signed   DPB/MEDQ  D:  12/01/2008  T:  12/01/2008  Job:  [id number]

## 2011-04-24 NOTE — Letter (Signed)
June 29, 2009   Antony Madura, MD  [phone]  [address]  [place], [place]  [address]   Re:  MICHELE, KERLIN                 DOB:  [birth year]   Dear Dr. Su Hilt:   I appreciate the opportunity of seeing the patient when he was in the  hospital with pneumonia.  At that time we got a CT scan, which showed no  evidence of recurrence of cancer.  He did have some herniation of the  small bowel into his chest through the posterior diaphragm and the area  where we probably did his esophagectomy or it could be where he had his  adrenal gland removed.  He seems to be asymptomatic and is mainly in the  supine position.  Chest x-ray today did shows no really changes and  actually decrease in the size of the hernia.  He is eating much better  and doing well overall.  He has airspace disease in the right middle  lobe and right lower lobe.  The plan is just to continue to follow him  and I will see him back again in 3 months with a chest x-ray.   Ines Bloomer, M.D.  Electronically Signed   DPB/MEDQ  D:  06/29/2009  T:  06/30/2009  Job:  [id number]   cc:   Rose Phi. Myna Hidalgo, M.D.

## 2011-04-24 NOTE — Letter (Signed)
September 05, 2010   Rose Phi. Myna Hidalgo, MD  [address] [facility] [address]  [place], [place] [address]   Re:  Glen Green, Glen Green                 DOB:  Dec 17, 1940   Dear [name]:   I saw the patient back today and we got a 8-month CT scan of his chest  and appears to be stable; however, I do not have the final reading.  Of  course, we will check that.  His blood pressure is 134/85, pulse 71,  respirations 18, sats were 100.  He is going fishing in the future.  He  had a recent skin cancer removed from his scalp.  I will see him back  again in 3 months with another CT scan.   Ines Bloomer, M.D.  Electronically Signed   DPB/MEDQ  D:  09/05/2010  T:  09/06/2010  Job:  [id number]

## 2011-04-24 NOTE — Consult Note (Signed)
Glen Green, Glen Green                 ACCOUNT NO.:  [id number]   MEDICAL RECORD NO.:  [id number]          PATIENT TYPE:  INP   LOCATION:  1331                         FACILITY:  [facility]   PHYSICIAN:  Clovis Pu. Cornett, M.D.DATE OF BIRTH:  1941/04/27   DATE OF CONSULTATION:  08/18/2007  DATE OF DISCHARGE:                                 CONSULTATION   REASON FOR CONSULTATION:  Abdominal pain and mass on CT.   HISTORY OF PRESENT ILLNESS:  The patient is a pleasant 70 year old male  who is now three years removed from a total esophagectomy for esophageal  cancer, receiving preoperative radiation chemotherapy.  This was done by  Dr. [name] and Dr. Wenda Low.  He also underwent a right adrenalectomy  for metastatic disease two years ago.  He was admitted for nausea,  vomiting and  abdominal pain.  CT scan revealed a left diaphragmatic  hernia which is not new.  There is some question of thickening at the  left hemidiaphragm,  mass versus hernia.  I was consulted today by the  internal medicine group, Dr. Ponciano Ort for this.  The patient currently has  no complaints.   PHYSICAL EXAMINATION:  ABDOMEN:  Soft, nontender, no rebound or  guarding.  The pain he was having when I talked to him was down in the  suprapubic area. It lasted a few moments and then improved after  admission to the hospital.  He had no nausea, vomiting or problems  eating.  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  I do  not see signs of obstruction or free fluid.  Otherwise,  I do not see  any evidence for mass.  This was a non-contrast study.   IMPRESSION:  Questionable abdominal mass versus left diaphragmatic  hernia.   PLAN:  Any evidence of mass with a  CT scan will be more useful in  trying to sort this out with a noncontrast CT.  In any  Event,  he has had a long-standing left diaphragmatic hernia and some  new problem.   Apparently, this is being watched.  He will need to be  followed up by Dr. [name] for this.  At this point, I see no reason to  give any sort of surgery at this point since I am not exactly sure what  we are operating on since it is unclear if there is evidence of  recurrent disease or not.  He looks good and was to call when this can  be done as an outpatient.  I will sign off at this point as I have  nothing else to have.      Thomas A. Cornett, M.D.  Electronically Signed     TAC/MEDQ  D:  08/18/2007  T:  08/19/2007  Job:  [id number]

## 2011-04-24 NOTE — Assessment & Plan Note (Signed)
OFFICE VISIT   Glen Green, Glen Green  DOB:  February 14, 1941                                        August 27, 2007  CHART #:  [id number]   Glen Green was admitted to the hospital with nausea, vomiting, abdominal  pain, headaches into workup including endoscopy which revealed a normal  endoscopy and some metaplasia on a biopsy, but no evidence for  recurrence of his cancer.  He was initially treated with fentanyl  patches and then developed some swelling and the patch was removed and  he saw his medical doctor, Dr. Su Hilt, who told me he had prostatitis.  He has been treated for prostatitis and has gotten better since then,  but he had a complete workup with no evidence of recurrences, cancer, 4-  1/2 years since we did esophagogastrectomy.  Blood pressure is 119/77,  pulse 91, respirations 18, saturation 99%.  Lungs are clear to  auscultation and percussion.  Will plan to see him back again in three  months with a chest x-ray.   Ines Bloomer, M.D.  Electronically Signed   DPB/MEDQ  D:  08/27/2007  T:  08/27/2007  Job:  [id number]

## 2011-04-24 NOTE — Assessment & Plan Note (Signed)
OFFICE VISIT   Glen Green, Glen Green  DOB:  16-Oct-1941                                        June 02, 2010  CHART #:  [id number]   HISTORY:  The patient comes in today for 6-week followup.  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  He was most recently seen in our office on Apr 21, 2010, at  which time he was doing well.  Since his last visit, he has completed  his cycle of radiation therapy.  He has an appointment to see Dr. Roselind Messier  in about 3 weeks for decision regarding any further adjuvant therapy.  He feels that he is progressing well.  He does have episodes of  shortness of breath with heavy exertion, but denies any shortness of  breath at rest or chest discomfort.  He feels that since his radiation  has ended, his stamina is improving and overall he is feeling well  today.   PHYSICAL EXAMINATION:  Vital Signs:  Blood pressures is 139/86, pulse is  76, respirations 18, and O2 sat 99% on room air.  His left mini  thoracotomy incision has healed well.  Lungs:  Clear to auscultation,  although breath sounds are slightly diminished on the left.  Heart:  Regular rate and rhythm without murmurs, rubs, or gallops.   DIAGNOSTIC TESTS:  Chest x-ray shows a stable, small left pleural  effusion/space.   ASSESSMENT AND PLAN:  The patient is doing well status post left upper  lobectomy.  Dr. [name] reviewed his chest x-ray and spoke with the  patient today.  We will see him back in 3  months with a CT scan.  He may call in the interim if he experiences any  problems or has questions.   Ines Bloomer, M.D.  Electronically Signed   GC/MEDQ  D:  06/02/2010  T:  06/03/2010  Job:  [id number]   cc:   Rose Phi. Myna Hidalgo, M.D.  Antony Madura, M.D.  Billie Lade, M.D.

## 2011-04-24 NOTE — Letter (Signed)
March 10, 2010   Rose Phi. Myna Hidalgo, MD  [address] [facility] [address]  [place], [place] [address]   Re:  Glen Green, Glen Green                 DOB:  01-Jun-1941   Dear [name]:   I saw the patient back to office today after his left upper lobectomy  with node dissection and he is doing well.  As we mentioned in cancer  conference, he needs to have radiation, I am referring him to Dr.  Roselind Messier.  His chest x-ray did show an air-fluid level anteriorly.  Otherwise, his incisions are well healed.  We removed his chest tube  sutures.  His blood pressure was 114/71, pulse 82, respirations 18, and  sats were 99%.  I will see him back in 2 weeks with another chest x-ray.   Ines Bloomer, M.D.  Electronically Signed   DPB/MEDQ  D:  03/10/2010  T:  03/11/2010  Job:  [id number]   cc:   Billie Lade, M.D.

## 2011-04-24 NOTE — Assessment & Plan Note (Signed)
OFFICE VISIT   Glen Green, Glen Green  DOB:  November 06, 1941                                        Apr 21, 2010  CHART #:  [id number]   HISTORY OF PRESENT ILLNESS:  This is a pleasant 70 year old Caucasian  male who is status post left upper lobectomy with lymph node dissection  by Dr. [name] in March 2011.  Pathology showed squamous cell carcinoma.  The patient was last seen in the office on March 24, 2010, at which time  his chest x-ray showed further improvement.  He has started radiation  with Dr. Roselind Messier.  He has finished 10 radiation treatment sessions and  has approximately 20 left.  He states that overall he has days where he  feels energetic and another days when he does not.  In the last couple  days, he has felt fairly energetic, he is able to go fishing, has not  yet resumed playing golf.  Denies any shortness of breath, fever,  chills.   PHYSICAL EXAMINATION:  General:  This is a pleasant 70 year old  Caucasian male who is in no acute distress.  Vital Signs:  Heart rate  97, BP 115/73, respirations 18, O2 sat 98% on room air.  Cardiovascular:  Regular rate and rhythm.  Lungs:  Slightly decreased at the left apex,  otherwise clear.  Left chest wound is well healed.   DIAGNOSTIC TESTS:  Chest x-ray done today showed a stable small left  pleural effusion and probable pleural thickening at the left apex.  No  interval acute or metastatic disease seen.   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  As  previously stated, the patient currently undergoing radiation  treatments.  Chest x-ray shows a stable left pleural effusion.  The  patient is going to return to see Dr. [name] in 6 weeks with a chest x-  ray.  Regarding the patient's activity, he was instructed he may do the  activities which he chooses; however, he may need periods of rest  secondary to ongoing radiation treatments.   Ines Bloomer, M.D.  Electronically Signed   DZ/MEDQ  D:  04/21/2010  T:  04/21/2010  Job:  [id number]   cc:   Rose Phi. Myna Hidalgo, M.D.  Antony Madura, M.D.

## 2011-04-24 NOTE — Consult Note (Signed)
NAME:  Glen Green, Glen Green NO.:  [id number]   MEDICAL RECORD NO.:  [id number]          PATIENT TYPE:  INP   LOCATION:  1331                         FACILITY[facility]  [facility]   PHYSICIAN:  Graylin Shiver, M.D.   DATE OF BIRTH:  09/11/1941   DATE OF CONSULTATION:  08/16/2007  DATE OF DISCHARGE:                                 CONSULTATION   REASON FOR CONSULTATION:  The patient is a 70 year old male who was  diagnosed with esophageal cancer in 2004.  He was treated with  chemotherapy and radiation therapy, and also underwent surgery by Dr.  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   The patient was admitted to [facility] because yesterday he  developed severe mid and lower abdominal pain.  He had associated nausea  and vomiting.  He denied hematemesis.  He had a CT scan done of the  abdomen and pelvis, and after this was reviewed we were consulted.  The  CT showed a new rind of soft tissue nodular thickening along the  inferior aspect of the left hemidiaphragm hernia, which measured  approximately 1.5 cm in thickness.  There are a cluster of nondilated  loops of this fluid-filled small bowel in the left upper quadrant,  extending through the diaphragm hernia.  It was considered that there  was recurrence of his esophageal tumor, and Dr. Darnelle Catalan requested  endoscopy.   I called and spoke to Dr. Amil Amen, the radiologist who read the CT; to  see if there was anything based on what he was seeing that I might be at  the time of an endoscopy.  Dr. Amil Amen reported that nothing that he was  reporting would be seen with visualization at endoscopy.  He then  reported that after further review of the films, that this rind of soft  tissue nodular thickening was most consistent with small bowel up in  that area.   The patient does report heartburn.  He denied hematemesis.   ALLERGIES:   None known.   MEDICATIONS:  Levothyroxine, Lipitor, Prilosec and aspirin.   SOCIAL HISTORY:  Does not smoke or drink alcohol.   REVIEW OF SYSTEMS:  No chest pain or shortness of breath at this time.   PHYSICAL EXAMINATION:  No distress; nonicteric.  NECK:  Supple.  HEART:  Regular rhythm.  No murmurs.  LUNGS:  Clear.  ABDOMEN:  Soft.  He does have some mild mid-abdominal tenderness; no  rebound or guarding.   IMPRESSION:  1. History of esophageal carcinoma; resected and treated with      radiation and chemotherapy.  2. CT findings as described above.  This rind of tissue is not      something that we are going to see endoscopically, according to the      radiologist.  After further review of the films of the radiologist,      Dr. Amil Amen feel that this is probably small bowel.   PLAN:  Will probably proceed with EGD to  evaluate the upper GI tract and  look for any evidence of local recurrence, and see if there is anything  there that might have caused this new nausea and vomiting.           ______________________________  Graylin Shiver, M.D.     SFG/MEDQ  D:  08/16/2007  T:  08/17/2007  Job:  [id number]   cc:   Valentino Hue. Magrinat, M.D.  Fax: [phone]   Rose Phi. Myna Hidalgo, M.D.  Fax: [phone]

## 2011-04-24 NOTE — Assessment & Plan Note (Signed)
OFFICE VISIT   Glen Green, Glen Green  DOB:  30-Nov-1941                                        May 07, 2007  CHART #:  [id number]   His blood pressure was 145/85, pulse 62, respirations 18, saturation  99%.   His chest x-ray was stable.  He will be getting a CT scan tomorrow for  his follow up.   His weight was 162 pounds.  He still had some problems with reflux.   I will see him back again in 6 months after his next CT scan.   Ines Bloomer, M.D.  Electronically Signed   DPB/MEDQ  D:  05/07/2007  T:  05/07/2007  Job:  [id number]

## 2011-04-24 NOTE — Letter (Signed)
November 25, 2007   Rose Phi. Myna Hidalgo, M.D.  [address] [facility]  [place], [place] [address]   Re:  Glen Green, Glen Green                 DOB:  04-24-1941   Dear [name]:   Glen Green came for followup today.  I reviewed his CT scan and chest x-  ray which looked great.  His blood pressure is 117/79, pulse 75,  respirations 18, saturations were 97%.  I think he has done remarkably  well.  It has been over 4 years since we did his esophagogastrectomy and  about 3 years since he had his adrenalectomy.  Overall, he is doing  remarkably well.  Plan to see him again in 6 months when you do with his  next CT scans.   Ines Bloomer, M.D.  Electronically Signed   DPB/MEDQ  D:  11/25/2007  T:  11/26/2007  Job:  [id number]

## 2011-04-24 NOTE — Discharge Summary (Signed)
Glen Green, Glen Green NO.:  [id number]   MEDICAL RECORD NO.:  [id number]          PATIENT TYPE:  INP   LOCATION:  4713                         FACILITY:  [facility]   PHYSICIAN:  Ruthy Dick, MD    DATE OF BIRTH:  January 27, 1941   DATE OF ADMISSION:  06/08/2009  DATE OF DISCHARGE:  06/13/2009                               DISCHARGE SUMMARY   REASON FOR ADMISSION:  Cough, fever, chills, pneumonia.   FINAL DISCHARGE DIAGNOSES:  1. Bilateral pneumonia, likely community acquired.  2. Hypothyroidism.  3. Hypokalemia, resolved.  4. History of esophageal cancer stable.  5. 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  Hemoglobin      A1c was 6.2, it was during this admission.  6. History of cerebral aneurysm status post clipping.   CONSULT DURING THIS ADMISSION:  None.   PROCEDURES DONE DURING THIS ADMISSION:  1. CT scan of the chest, pelvis, and abdomen which was ordered by the      oncologist, Dr. [name], to follow up on the patient's esophageal      cancer.  The reading of this CAT scan was patchy primary right-      sided airspace disease which is suspicious for aspiration or      infection.  There was a dense left lower lobe opacity due to      atelectasis, I suspect from a distant hernia.  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  Thoracic lymph      nodes looked to be slightly enlarged, but the reactive enlargement      was thought to be likely and there was no metastatic process or      evidence in the abdomen.  No acute pelvic processes either.   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  He also had a cerebral  aneurysm status post clipping.  He came in with fever and chills that  had been going on for about 4-5  days accompanied with productive cough.  X-ray revealed persistent bilateral infiltrates.  The patient was  admitted for pneumonia which was likely community acquired.  Suspicion  of aspiration was also there; however, the patient denies any symptoms  of dysphagia or choking feeling like trying to swallow; however, we  covered him with Avelox at first and even though his symptoms improved  his chest x-ray got a little worse.  Because of this, we added Zosyn to  the regimen to cover for possible anaerobic component, and a repeat  chest x-ray today showed some improvement in the patient's image.  The  patient is feeling well for the past 2-3 days and has had no fever  whatsoever.  He says his cough is almost resolved altogether and there  is no production of sputum whatsoever.  He is in a very  good mood and  says he would like to go home.   PHYSICAL EXAMINATION:  VITAL SIGNS:  His vitals today are temperature  97.3, pulse 73, respirations 20, blood pressure 101/65, and saturating  97% on room air.  CHEST:  Clear to auscultation bilaterally.  ABDOMEN:  Soft, nontender.  EXTREMITIES:  No clubbing.  No cyanosis.  No edema.  CARDIOVASCULAR:  Some first and second heart sounds only.  CENTRAL NERVOUS SYSTEM:  Nonfocal.   The patient is to follow with Dr. Ross Ludwig in 1-2 weeks, and he  is to call for this appointment.  Prior to seeing Dr. Merilynn Finland, the  patient is to have a chest x-ray, AP and lateral, obtained.  I discussed  this at length with the patient and wife, and they are willing and  capable of calling the x-ray office and make an appointment for this x-  ray prior to seeing Dr. Ross Ludwig.  They seem a reliable couple  to take care of this.   The patient will be discharged home on Augmentin 875 mg p.o. b.i.d. for  5 days,  mg p.o. daily for 5 days, and Synthroid 50 mcg p.o. daily.   The patient is also to follow with Dr. [name], Oncology, in 2 weeks as   scheduled.   Time used for discharge planning is greater than 30 minutes.      Ruthy Dick, MD  Electronically Signed     GU/MEDQ  D:  06/13/2009  T:  06/14/2009  Job:  [id number]   cc:   Ross Ludwig, MD  Ines Bloomer, M.D.

## 2011-04-24 NOTE — Assessment & Plan Note (Signed)
OFFICE VISIT   Glen Green, Glen Green  DOB:  [birth year]                                        March 24, 2010  CHART #:  [id number]   The patient came today, and his chest x-ray showed further improvement  of his x-ray.  He is slowly making improvement.  He will start radiation  in the near future by Dr. Roselind Messier.  I will see him back again in 4 weeks  for next follow up.  I told him he could gradually increase his  activities.  Blood pressure is 121/81, pulse 92, respirations 18, sats  were 98%.   Ines Bloomer, M.D.  Electronically Signed   DPB/MEDQ  D:  03/24/2010  T:  03/25/2010  Job:  [id number]

## 2011-04-24 NOTE — Op Note (Signed)
NAME:  Glen Green, Glen Green NO.:  [id number]   MEDICAL RECORD NO.:  [id number]          PATIENT TYPE:  INP   LOCATION:  1331                         FACILITY:  [facility]   PHYSICIAN:  Graylin Shiver, M.D.   DATE OF BIRTH:  1941/05/28   DATE OF PROCEDURE:  08/18/2007  DATE OF DISCHARGE:                               OPERATIVE REPORT   UPPER GASTROINTESTINAL ENDOSCOPY WITH BIOPSY:   REASON FOR PROCEDURE:  The patient has a history of esophageal carcinoma  resected in 2004.  He had an esophagogastrectomy with a gastric pull-up.  He came into the hospital with nausea and vomiting.  CT was done.  Dr.  Darnelle Catalan requested endoscopy to look for evidence of recurrence.   Informed consent was obtained after explanation of the risks of  bleeding, infection and perforation.   PREMEDICATION:  Fentanyl 50 mcg IV, Versed 2 mg IV.   PROCEDURE:  With the patient in the left lateral decubitus position, the  Pentax gastroscope was inserted into the oropharynx and passed into the  esophagus.  It was advanced down the esophagus and at 21 cm from the  incisor teeth, I saw three metallic clips in this region.  This is the  area of the esophagogastric anastomosis.  After this, the stomach was  entered.  He has had a partial gastrectomy.  The gastric pouch looked  unremarkable.  The scope was then advanced down to the gastrointestinal  anastomosis, which looked unremarkable.  The scope was advanced into the  intestinal limb, which looked fine for the first few centimeters that it  was inspected.  The scope was then brought out just above the  esophagogastric anastomosis.  There was a raised 4-mm nodule, which was  biopsied.  The proximal esophagus above this point looked normal.  He  tolerated the procedure well without complications.   IMPRESSION:  1. No obvious appearance of recurrent malignancy.  2. Nodule in the proximal esophagus, biopsied.           ______________________________  Graylin Shiver, M.D.     SFG/MEDQ  D:  08/18/2007  T:  08/19/2007  Job:  [id number]   cc:   [employer]   Valentino Hue. Magrinat, M.D.  Fax: [phone]

## 2011-04-24 NOTE — Letter (Signed)
January 18, 2010   Anselm Pancoast. Zachery Dakins, M.D.  [address]  [place] [address]   Re:  Glen Green, Glen Green                 DOB:  1941-08-08   Dear Annette Stable,   I saw the patient in the office today and he is doing well from your  standpoint.  CT scan was obtained today that showed a gastric pull  through, the left pleural effusion.  No more signs of obstruction.  However, on CT scan, there is 2-3 cm lesion in the medial segment of  left upper lobe which is worrisome for possible lung cancer.  It sounds  like the patient had all other problems and now it looks like he may  have a lung cancer.  I doubt if this is metastatic disease, but we are  going to go ahead and get a PET scan on him and pulmonary function test  and we will see him back at that time depending on what we find, then we  will make arrangements to either do a resection of this or refer him  back to Dr. Myna Hidalgo for possible chemotherapy and/or radiation.   Ines Bloomer, M.D.  Electronically Signed   DPB/MEDQ  D:  01/18/2010  T:  01/19/2010  Job:  [id number]   cc:   Rose Phi. Myna Hidalgo, M.D.

## 2011-04-24 NOTE — Letter (Signed)
December 06, 2010   Rose Phi. Myna Hidalgo, MD  [address]  [place] [address]   Re:  NAHUN, KRONBERG                 DOB:  17-Aug-1941   Dear [name]:   I saw the patient back today.  We got a CT scan of his chest and his  abdomen.  His preliminary report shows no recurrence, but I do not have  a final report, we will of course check that.  I have scheduled him for  another CT in 6 months.  He is doing well overall, although he has had  some decreased hearing.  He has seen an ENT doctor about that.  His  blood pressure was 132/77, pulse 68, respirations 18, and sats were 97%.  His weight was 138 pounds, seems 5 feet.  I appreciate the opportunity  of seeing the patient.   Sincerely,   Ines Bloomer, M.D.  Electronically Signed   DPB/MEDQ  D:  12/06/2010  T:  12/06/2010  Job:  [id number]

## 2011-04-24 NOTE — Assessment & Plan Note (Signed)
OFFICE VISIT   Glen Green, Glen Green  DOB:  [birth year]                                        June 01, 2008  CHART #:  [id number]   Glen Green came today.  He is now doing well.  He is now almost 4-1/2  years since his last surgery.  A CT scan showed no evidence of  recurrence of his calculi.  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  He is doing well overall.  I will plan to  see him back again in 6 months with another CT scan.   Ines Bloomer, M.D.  Electronically Signed   DPB/MEDQ  D:  06/01/2008  T:  06/01/2008  Job:  [id number]

## 2011-04-24 NOTE — H&P (Signed)
NAME:  Glen Green, FRATICELLI NO.:  [id number]   MEDICAL RECORD NO.:  [id number]          PATIENT TYPE:  EMS   LOCATION:  MAJO                         FACILITY:  [facility]   PHYSICIAN:  Eduard Clos, MDDATE OF BIRTH:  05-Jul-1941   DATE OF ADMISSION:  06/08/2009  DATE OF DISCHARGE:                              HISTORY & PHYSICAL   PRIMARY CARE PHYSICIAN:  Ross Ludwig.   CHIEF COMPLAINT:  Cough, fever, chills.   HISTORY OF PRESENT ILLNESS:  A 70 year old male with a known history of  hypothyroidism, history of CA of the esophagus, status post surgery,  history of cerebral aneurysm clipping for hemorrhage.  Presented with a  complaint of ongoing cough with productive sputum with fever and chills  over the last 4 to 5 days.  Patient has been recently to the beach.  Since then, he has been feeling tired, weak, and the above symptoms.  In  the ER, the patient had a chest x-ray, which showed persistent bilateral  infiltrates.  Patient admitted for further workup of pneumonia.  Patient  denies any chest pain, palpitations, dizziness, or loss of  consciousness, abdominal pain, nausea, vomiting, diarrhea, or any  weakness of the limbs.  The patient did go to his primary care physician  yesterday and was  x-ray and was called to go to the ER.   PAST MEDICAL HISTORY:  1. CA of the esophagus, status post surgery.  2. Hypothyroidism, history of cerebral aneurysm clipping.   PAST SURGICAL HISTORY:  1. Cerebral aneurysm clipping.  2. Esophageal surgery for esophageal cancer.  3. Cholecystectomy.   MEDICATIONS PRIOR TO ADMISSION:  Patient states that he takes Prilosec  and Synthroid, the doses of which he does not know.  Patient was asked  to bring his medications through his family.   ALLERGIES:  No known drug allergies.   SOCIAL HISTORY:  Patient lives with his family.  He denies smoking  cigarettes, drinking alcohol, or using illegal drugs.   FAMILY HISTORY:   Nothing contributory.   REVIEW OF SYSTEMS:  As per the history of present illness, nothing else  significant.   PHYSICAL EXAMINATION:  Patient examined at bedside.  Not in acute  distress.  VITAL SIGNS:  Blood pressure is 102/58, pulse 90 per minute, temperature  97.9, respirations 18 per minute, O2 sat 94%.  HEENT:  Anicteric.  No pallor.  CHEST:  Bilateral air entry present.  No rhonchi, no crepitation.  HEART:  S1 and S2 heard.  ABDOMEN:  Soft.  Nontender.  Bowel sounds heard.  CNS:  Awake, alert and oriented to time, place, and person.  Moves upper  and lower extremities 5/5.  EXTREMITIES:  Peripheral pulses felt.  No edema.   LABS:  Chest x-ray:  Persistent bilateral pneumonia, possibly worsened  in the left lower lobe.   CBC:  WBC 11.3, hemoglobin 12.9, hematocrit 38.8, platelets 297,  neutrophils 84%.  Basic metabolic panel:  Sodium 136, potassium 3.4,  chloride 102, carbon dioxide 29, glucose 149, BUN 19, creatinine 0.8,  calcium 8.5.   ASSESSMENT:  1. Pneumonia:  Probably community-acquired.  2. History of esophageal carcinoma, status post surgery.  3. Hypothyroidism.  4. History of cerebral aneurysm clipping for brain hemorrhage.  5. Hyperglycemia.   PLAN:  1. Will admit patient to telemetry.  2. Will get blood cultures and sputum cultures.  3. Will place patient on Avelox.  4. Will check a hemoglobin A1C to see if patient has any diabetes.  5. Further recommendations as patient's condition evolves.      Eduard Clos, MD  Electronically Signed     ANK/MEDQ  D:  06/08/2009  T:  06/08/2009  Job:  [id number]   cc:   Dr. Ross Ludwig

## 2011-04-24 NOTE — Letter (Signed)
January 25, 2010   Rose Phi. Myna Hidalgo, MD  [address] [facility] [address]  [place], [place] [address]   Re:  Glen Green, Glen Green                 DOB:  22-May-1941   Dear Glen Green:   I saw the patient back today.  As I mentioned in my last letter, he has  a left upper lobe lesion and a left pleural effusion.  The left upper  lobe lesion is found to be punctate and the left pleural effusion there  is a question that it may be some increased effusion.  For this reason,  I am going to schedule him to have a thoracentesis and a biopsy of his  left upper lobe lesion.  This could be a primary lung cancer or it could  be recurrence of his esophageal cancer.  I will see him back again and  at that time, let you know of our findings.   Sincerely,   Ines Bloomer, M.D.  Electronically Signed   DPB/MEDQ  D:  01/25/2010  T:  01/26/2010  Job:  [id number]   cc:   Anselm Pancoast. Zachery Dakins, M.D.

## 2011-04-27 NOTE — Op Note (Signed)
   NAME:  LEXUS, BARLETTA NO.:  [id number]   MEDICAL RECORD NO.:  [id number]                   PATIENT TYPE:  INP   LOCATION:  2301                                 FACILITY:  [facility]   PHYSICIAN:  Thornton Park. Daphine Deutscher, M.D.             DATE OF BIRTH:  [birth year]   DATE OF PROCEDURE:  02/10/2003  DATE OF DISCHARGE:                                 OPERATIVE REPORT   INDICATIONS FOR PROCEDURE:  Persistent bleeding through right chest tube.   POSTOPERATIVE DIAGNOSIS:  Bleeding apparently from mediastinum.   PROCEDURE:  Reexploration laparotomy.   SURGEON:  Thornton Park. Daphine Deutscher, M.D. and Ines Bloomer, M.D.   DESCRIPTION OF PROCEDURE:  The patient was taken emergently from 2300 back  to room #7 and given general anesthesia.  The abdomen was prepped with  Betadine and draped sterilely.  The staples were removed and the PDS was  removed.  The abdomen was entered without difficulty and exposure was  gained.  First upon entering the abdomen, it appeared to be very dry.  I  took down the sutures holding the stomach to the diaphragmatic edge and we  encountered some blood up in the mediastinum.  We visualized the lower  portion of the stomach.  Evacuated some blood and clots, but did not see any  active bleeding from the greater or lesser curvatures of the stomach up to  the mediastinum.  We went ahead and tacked the stomach back to the right and  left crus.  Needle, sponge, and instrument count correct. The fascia was  closed with running #1 PDS from below and above. Staples were applied to the  skin.   Next, the patient was placed in the position for right thoracotomy to be  dictated subsequently by Ines Bloomer, M.D.                                               Thornton Park Daphine Deutscher, M.D.    MBM/MEDQ  D:  02/10/2003  T:  02/11/2003  Job:  [id number]

## 2011-04-27 NOTE — Op Note (Signed)
NAME:  Glen Green, Glen Green                           ACCOUNT NO.:  [id number]   MEDICAL RECORD NO.:  [id number]                   PATIENT TYPE:  AMB   LOCATION:  DSC                                  FACILITY:  [facility]   PHYSICIAN:  Thornton Park. Daphine Deutscher, M.D.             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   PROCEDURE:  Left subclavian Port-A-Cath insertion.   SURGEON:  Thornton Park. Daphine Deutscher, M.D.   ANESTHESIA:  MAC.   DESCRIPTION OF PROCEDURE:  Glen Green is a 70 year old gentleman who was  taken to room 2 at [facility] and given sedation.  The chest was  prepped with Betadine and draped sterilely.  First numbed up an area with 1%  lidocaine beneath his left clavicle and cannulated the left subclavian vein  on the first pass and passed the wire without difficulty.  The wire's  position was checked with the C-arm.  The port site was created a few  centimeters down below by a transverse incision after infiltrating with  lidocaine and creating a pocket.  I used an 8 [place]  port.  I connected this to flush and then after passing it subcutaneously I  then inserted it with a peel-away catheter under C-arm guidance and placed  it in the right atrial caval region.  This flushed easily and withdrew  easily.  I then cut the catheter and fit the radiopaque hub over it,  crimping it up on top of the stem as directed, and then snapping it in  place.  This was then tucked into the port site and then sutured with a 2-0  Prolene.  I then flushed it with a right angle Demetrios Isaacs without difficulty and  it looked good on C-arm.  I felt that it was well within, probably at the  right atrium  junction.   The area was irrigated and both incision sites were closed with 4-0 Vicryl  subcutaneously and subcuticularly with Benzoin and Steri-Strips.  A right  angle Demetrios Isaacs was then used to access the port and flush with concentrated  heparin and it was left in this state for access tomorrow.  The patient  seemed to tolerate the procedure well.  A portable chest x-ray was obtained  in the recovery room after the procedure.                                               Thornton Park Daphine Deutscher, M.D.    MBM/MEDQ  D:  03/09/2004  T:  03/09/2004  Job:  [id number]   cc:   Antony Madura, M.D.  [address]  [place]  [place] [address]  Fax: [phone]   Rose Phi. Myna Hidalgo, M.D.  [address] [facility]  [place], [place] [address]  Fax: [phone]   Dewayne Shorter, M.D.

## 2011-04-27 NOTE — Discharge Summary (Signed)
NAME:  Glen Green, BRAU NO.:  [id number]   MEDICAL RECORD NO.:  [id number]                   PATIENT TYPE:  INP   LOCATION:  5705                                 FACILITY:  [facility]   PHYSICIAN:  Thornton Park. Daphine Deutscher, M.D.             DATE OF BIRTH:  [birth year]   DATE OF ADMISSION:  01/21/2004  DATE OF DISCHARGE:  01/25/2004                                 DISCHARGE SUMMARY   ADMISSION DIAGNOSIS:  Prior esophagogastrectomy for cancer of the esophagus  with metastasis to right adrenal gland.   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   PROCEDURE:  Right adrenalectomy on January 21, 2004.   HOSPITAL COURSE:  The patient is a 70 year old gentleman who came in as an  a.m. admission and underwent the right adrenalectomy on January 21, 2004.  Postoperatively, he did very well and managed to have very stable vital  signs.  Minimal drainage from his Harrison Mons drain above his right kidney was  noted. This was left in for the entire hospitalization.  He was begun on a  regular diet on February 14, and he was ready for discharge on January 25, 2004.  Incision was bland appearing.  His staples were removed and Steri-  Strips were applied.  His 19 Blake drain was removed as it just continued to  have a little bit of serosanguineous drainage.  His condition was good and  he was going to go home only to take a few Oxycodone that he had at home. He  was instructed to return to the office in two weeks in follow-up.                                                Thornton Park Daphine Deutscher, M.D.    MBM/MEDQ  D:  01/25/2004  T:  01/25/2004  Job:  [id number]   cc:   Ines Bloomer, M.D.  [address]  [place]  [place] [address]   Antony Madura, M.D.  [address]  [place]  [place] [address]  Fax: [phone]   Rose Phi. Myna Hidalgo, M.D.  [address] [facility]  [place], [place] [address]  Fax: [phone]

## 2011-04-27 NOTE — Op Note (Signed)
NAME:  Glen Green, Glen Green NO.:  [id number]   MEDICAL RECORD NO.:  [id number]                   PATIENT TYPE:  INP   LOCATION:  2899                                 FACILITY:  [facility]   PHYSICIAN:  Thornton Park. Daphine Deutscher, M.D.             DATE OF BIRTH:  [birth year]   DATE OF PROCEDURE:  02/10/2003  DATE OF DISCHARGE:                                 OPERATIVE REPORT   OPERATION PERFORMED:  Transhiatal esophagectomy, cholecystectomy, cervical  esophagogastrostomy, Heineke Mikulicz pyloroplasty, feeding jejunostomy,  removal of Port-A-Cath, insertion of left chest tube.   SURGEON:  1. Thornton Park Daphine Deutscher, M.D.  2. Ines Bloomer, M.D.   ANESTHESIA:  General.   The portion of the procedure that will be dictated by me includes the  abdominal portion including the cholecystectomy, mobilization of the stomach  and take-down of the short gastric vessels, Heineke-Mikulicz pyloroplasty,  #14 red Robinson jejunostomy.   DESCRIPTION OF PROCEDURE:  The patient was in room 7 and asleep and was  prepped widely from the neck to the groin.  An upper abdominal midline  incision was made through the linea alba and entering the abdomen.  Using  the upper hand retractor, the left lateral segment was taken down and tucked  behind and this exposed the esophagogastric junction.  The patient  had  evidence of radiation change to the left lateral segment as well as to some  kind of hard scarred nodes that appeared in the proximal stomach along the  lesser curvature.   The gallbladder was huge, white and gray and contained very large palpable  stones.  This was held up with gentle manual retraction and Calot's triangle  was delineated.  The patient had a fairly straight forward cystic duct and  cystic artery and we went ahead and dissected down where we could see the  common bile duct junction.  The cystic duct was triple clipped and divided.  A clip was placed proximally and a  Lahey clamp was used on the proximal  cystic duct.  The cystic artery was triple clipped, divided.  The  infundibulum and proximal portion of the gallbladder were freed up and then  I went from the fundus and came down again using the electrocautery.  I  entered at one point and used this to decompress some bile but put a clamp  on it.  There was essentially no stone material discharged.  The gallbladder  was then removed from the gallbladder bed with the electrocautery.  The bed  was cauterized and Surgicel was placed in the gallbladder bed.   Next, we did a Kocher maneuver mobilizing the duodenum.  This was done with  electrocautery without difficulty.  The proximal stomach was next mobilized  dividing the gastrohepatic window using the Harmonic scalpel staying to the  right of the nodes and going up and then incising the peritoneal reflection  over the distal esophagus.  This was all taken down and then we carried this  across anteriorly to the left crus which we incised.   We went distally down to the pylorus and stayed outside of the  gastroepiploic and took down the gastrocolic ligament staying outside the  gastroepiploic and then moving cephalad along the greater curvature, taking  down the short gastric vessels.  We used the Harmonic scalpel for this.  Posteriorly we divided the filmy adhesions posteriorly and eventually  divided up near the left crus. There was essentially no bleeding in this  area.  We elevated the esophagogastric junction with a Penrose drain and  used the Harmonic, had gone up posteriorly taking down the adhesions to the  esophagus into the mediastinum and on both right and left sides.  On the  left side, we did seem to get into the pleura.  This was carried up more  proximally.   Dr. [name] then went above and began the cervical portion of the operation.  Distally, I went head and divided the pylorus with the harmonic scalpel over  a distance of  approximately 3 to 4 cm.  This was closed in a Heineke-  Mikulicz fashion with interrupted Gambee sutures of 3-0 silk.  Ultimately,  the nasogastric tube was brought down just to the pyloric region of  pyloroplasty.  A good patent pyloroplasty was present.   We then worked from above and below and we divided the remaining attachment.  This was somewhat tedious but we went ahead and were able to get this  accomplished from above and blow and then the gastric tube was then brought  down by dividing the cervical esophagus with a GIA.  The specimen was  brought into the abdomen.  There we were able to feel the nodes and then  decide on distal margin using again the stapling device to divide the  stomach.  Specimen was marked and sent to Dr. Delila Spence who confirmed that the  margins were clear.   Using a scope bag, the gastric tube was oriented and brought out and the  anastomosis was created by Dr. [name].  In the meantime, we created a  feeding jejunostomy using a 14 red Roxan Hockey and a Witzel tunnel.  Where it  entered the bowel we then came to a pursestring suture of 4-0 Vicryl and  then the tube was Witzelled with about four interrupted 3-0 silks.  It was  tacked to the anterior abdominal wall laterally and medially and secured  outside with a 3-0 nylon.  Sponge and needle counts were correct.  After the  anastomosis had been completed, the stomach just above the pyloroplasty was  tacked to the diaphragm with 3-0 silks to close the diaphragmatic defect  gently and then that detached the stomach in that region.  The gallbladder  bed was dry.   The area was irrigated.  No bleeding or bile leaks were noted.  The fascia  was closed with a running #1 PDS from above and below and tied in the  middle.  Staples were used in the skin.                                                Thornton Park Daphine Deutscher, M.D.   MBM/MEDQ  D:  02/10/2003  T:  02/10/2003  Job:  [id number]

## 2011-04-27 NOTE — Discharge Summary (Signed)
NAME:  Glen Green, Glen Green NO.:  [id number]   MEDICAL RECORD NO.:  [id number]                   PATIENT TYPE:  INP   LOCATION:  2036                                 FACILITY:  [facility]   PHYSICIAN:  Ines Bloomer, M.D.              DATE OF BIRTH:  Mar 23, 1941   DATE OF ADMISSION:  DATE OF DISCHARGE:  02/26/2003                                 DISCHARGE SUMMARY   PRIMARY CARE PHYSICIAN:  Antony Madura, M.D.   CONSULTS:  1. Rose Phi. Myna Hidalgo, M.D.  2. Llana Aliment. Randa Evens, M.D.  3. Lowell C. Catha Gosselin, M.D.  4. Billie Lade, M.D.  5. Shan Levans, M.D. [facility].  6. Thornton Park. Daphine Deutscher, M.D.   FINAL DIAGNOSES:  1. Stage T3, N1, M0 adenocarcinoma of  the distal esophagus.  2. Postoperative hypoxic respiratory failure.  3. Postoperative atrial fibrillation.  4. Anemia.  5. Postoperative bleeding complications, resolved.  6. Hypertension.  7. Postoperative mucus plugging.  8. History of smoking.  9. History of cerebral aneurysm with bleed in 1994.   PROCEDURES:  1. Transhiatal esophagogastrectomy with pyloroplasty, cholecystectomy,     jejunostomy, removal of porta-cath and placement of right chest tube on     February 10, 2003, by Dr. [name], assisted by Dr. Wenda Low.  2. Re-exploration for bleeding on February 10, 2003, with exploratory laparotomy     and exploratory thoracotomy with resolution of bleeding.   BRIEF HISTORY:  The patient is a 70 year old male with a previous 6 to 7  week history of dysphagia who was found to have stage 3 esophageal cancer.  He had undergone radiation therapy. He was then sent to Dr. [name] for  transhiatal esophagogastrectomy.   HOSPITAL COURSE:  He came to the hospital for the elective procedure on  February 10, 2003. Initially there were no complications. He was taken to the  recovery room postoperatively and was found to have increased chest tube  output. He was taken back to the operating room for exploratory laparotomy  and thoracotomy by Dr. [name] and Dr. Daphine Deutscher. The bleeding was found and  taken care of.   After that he made a slow, steady course toward discharge. This was  complicated by  mucus plugging and respiratory failure. He was seen by  pulmonology. He was put on the ventilator. Tube feedings were started which  he tolerated well. He developed fevers and was put on IV antibiotics,  vancomycin, Primaxin. His fevers gradually subsided.   After several days he was extubated. Gradually p.o.'s were started and tube  feeds were gradually tapered back. His rhythm remained stable after a brief  episode of atrial fibrillation. He went back in sinus rhythm and began  taking a soft diet without difficulty.   By postoperative day #15 he was up walking around, feeling well, afebrile,  vital signs were stable, 100% on room air. Laboratory was satisfactory.  His  WBCs were 7.9, platelets 464, H&H 11.1/32.8. Renal function is fine. His  physical examination was satisfactory. Wounds were healing well. He is  swallowing OK. It is anticipated that  he should be suitable for discharge  pending satisfactory morning rounds on Friday, February 26, 2003.   DISCHARGE MEDICATIONS:  1. Prevacid 30 mg 1 daily.  2. Reglan 10 mg 1 tablet p.o.  q.i.d.  3. Tylox 1 to  2 tablets q.4-6h. p.r.n. pain.   DISCHARGE INSTRUCTIONS:  Special instructions, he was told to  avoid  driving, heavy lifting, strenuous activity, working. He was told to walk  daily and use his incentive spirometer daily. He was  told to continue his  diet precautions. He was told to  clean his wounds gently daily with soap  and water and to call  the office if there are any problems with his wounds.  He was told to get a chest x-ray at [facility] 1 hour  before seeing Dr. [name] and to bring it with him to see Dr. [name].   FOLLOW UP:  1. Followup with Dr. [name] on Thursday, March 04, 2003, at 3:45 p.m.  2. He is to follow up with  oncology as directed.     Lissa Merlin, P.A.                          Ines Bloomer, M.D.    Alwyn Ren  D:  02/25/2003  T:  02/26/2003  Job:  [id number]

## 2011-04-27 NOTE — Consult Note (Signed)
NAME:  Glen Green, HOEY NO.:  [id number]   MEDICAL RECORD NO.:  [id number]                   PATIENT TYPE:  INP   LOCATION:  2301                                 FACILITY:  [facility]   PHYSICIAN:  Shan Levans, M.D. [facility]            DATE OF BIRTH:  03/31/1941   DATE OF CONSULTATION:  02/12/2003  DATE OF DISCHARGE:                                   CONSULTATION   INDICATIONS:  Hypoxic respiratory failure.   HISTORY OF PRESENT ILLNESS:  This is a 70 year old white male who on February 10, 2003, underwent esophagogastrectomy with cholecystectomy, jejunostomy,  pyloroplasty, and removal of Port-A-Cath via transhiatal approach for  adenocarcinoma of the esophagus per Ines Bloomer, M.D.  Postoperatively,  the patient has had problems with fever, hypoxemia, and ventilator  dependence.  The patient continues to have congestion and purulent sputum.  The patient is on full mechanical ventilator support.  We were asked to see  the patient to assist in ICU management.   PAST MEDICAL HISTORY:  Significant for:  1. Weight loss.  2. Right middle cerebral aneurysm clipping in 1994.  3. History of sepsis with Port-A-Cath infection in December of 1993.  He has     had the Port-A-Cath now removed.   CURRENT MEDICATIONS:  1. Atrovent 0.5 mg q.i.d.  2. Albuterol 2.5 mg q.i.d.  3. Insulin sliding scale.  4. Protonix 40 mg IV q.12h.  5. Mefoxin 2 g q.6h.  6. Morphine p.r.n.  7. Ativan p.r.n.   ALLERGIES:  None.   REVIEW OF SYSTEMS:  He has had a 30-pound weight loss.  He has had  chemotherapy and radiation therapy preoperatively.  No history of diabetes.  No history of kidney disease, asthma, COPD, or a history of TIA or CVA.  He  did have some difficulty with dysphagia and possible aspiration with  esophageal issues.   FAMILY HISTORY:  His mother died at age 28 of cerebral aneurysm.  His father  died at age 80 of renal failure.   SOCIAL HISTORY:  The patient  has been married for 38 years.  He had drank a  six-pack of beer for 20 years and quit 25 years ago.  He has smoked a pack a  day for 45 years and was still smoking up until the time of surgery.   PHYSICAL EXAMINATION:  VITAL SIGNS:  Temperature 100 degrees, blood pressure  180/60, pulse 120-130 in sinus tachycardia.  GENERAL APPEARANCE:  The patient is orally intubated, but semiconscious.  CHEST:  Coarse rhonchi.  Poor air movement.  A few expiratory wheezes.  CARDIAC:  Resting tachycardia without S3.  Normal S1 and S2.  ABDOMEN:  Soft.  Postoperative abdomen seen.  EXTREMITIES:  Clear.  Good perfusion.  Urine output adequate.   LABORATORY DATA:  Hemoglobin 9.1, white count 12.5, platelet count 81,000.  Amylase 57.  Sodium 132, potassium 3.6,  chloride 100, CO2 27, BUN 14,  creatinine 0.7, blood sugar 131.  The pH was 7.43, pCO2 41, and pO2 84 on  40%, tidal volume 850, rate of 10, PEEP of 5, and PRVC mode.  Cultures are  pending at the time of the dictation.   IMPRESSION:  1. Hypoxic respiratory failure, status post esophagogastrectomy on February 10, 2003, with adenocarcinoma of the esophagus with transhiatal approach.  2. Postoperative hypoxic respiratory failure with bilateral lower lobe     infiltrates.  3. Fever.  4. Purulent sputum.  5. Status post right mid cerebral artery aneurysm clipping in 1994.  6. History of Port-A-Cath sepsis in December and now the Port-A-Cath is     removed.   RECOMMENDATIONS:  1. Will pursue bronchoscopy with quantitative BAL.  2. Increase pulmonary toilet.  3. Leave on ventilator through the weekend.  4. Increase nebulizer treatments.  5. Change to IV cefepime and vancomycin.  6. Recheck sputum C&S, urine culture, and blood culture.  7. Intensify pulmonary toilet with nebulizer treatments.  8. Will follow expectantly with you.                                               Shan Levans, M.D. [facility]    PW/MEDQ  D:  02/12/2003  T:   02/13/2003  Job:  [id number]

## 2011-04-27 NOTE — Consult Note (Signed)
NAME:  Glen Green, Glen Green NO.:  [id number]   MEDICAL RECORD NO.:  [id number]                   PATIENT TYPE:  INP   LOCATION:  2301                                 FACILITY:  [facility]   PHYSICIAN:  Rose Phi. Myna Hidalgo, M.D.              DATE OF BIRTH:  1941-05-05   DATE OF CONSULTATION:  02/16/2003  DATE OF DISCHARGE:                                   CONSULTATION   REASON FOR CONSULTATION:  Stage III (T3 N1 M0) adenocarcinoma of the distal  esophagus.   HISTORY OF PRESENT ILLNESS:  The patient is a 70 year old white gentleman  well-known to me.  I initially saw him back in the early winter with stage  III adenocarcinoma of the esophagus.  He presented with dysphagia.  He  really did not have much in the way of weight loss.   He appeared to have locally advanced disease by CT scan and PET scan.  There  is no evidence of metastatic disease.   He underwent chemotherapy with cisplatin/5-FU.  He underwent concurrent  radiation therapy.  He received 5040 rad.  He did lose about 30 pounds  during therapy.  He did require a feeding tube to be placed.   He subsequently was seen by Dr. [name].  Dr. [name] felt that he had a very  good response.  He had resolution of adenopathy and esophageal thickening on  CT scan.  He had no evidence of metastatic disease elsewhere.   Subsequently, Dr. [name] took him to surgery.  He underwent a transhiatal  procedure.  He subsequently needs to undergo a re-exploration because of  hemorrhaging.  He is having increased draining through the right chest tube.  He subsequently became hypertension.  He subsequently had small vessels  cauterized.   Unfortunately, he has had a very rough hospital course.  He has been re-  intubated because of mucus plugging.   With respect to his surgical procedure, the pathology report ([id number])  showed a 2.5 mm residual focus of invasive carcinoma within the muscularis  propria.  He had a 2.5  cm area of acellular mucin dissecting fluid of the  muscularis propria into the adventitia.  No lymphovascular invasion was  noted.  He had all margins negative.  He had 2 of 17 lymph nodes containing  microscopic areas of tumor, 9 of 17 lymph nodes contained acellular mucin  with no obvious viable tumor.  As such, he was felt to be a stage III (T3 N1  M0) adenocarcinoma.  This was based on the acellular mucin invasion.   He also underwent a cholecystectomy.  Again, he is now re-intubated.   PAST MEDICAL HISTORY:  Cerebral aneurysm repair in 1994.   ADMISSION MEDICATIONS:  1. Coumadin 1 mg p.o. daily which he stopped prior to surgery.  2. Compazine 10 mg p.o. q.4 h. p.r.n.  3. Ativan 1 mg p.o. q.4  h. p.r.n.  4. Acyclovir as needed.   ALLERGIES:  No known drug allergies.   SOCIAL HISTORY:  He was working until he was found to have his malignancy.  He used to drink a six-pack a day for 20 years.  He stopped 20-25 years ago.  He has a 45-pack-year history of tobacco use.  He stopped in November 2003.   PHYSICAL EXAMINATION:  GENERAL:  He is an intubated gentleman.  He is  sedated.  VITAL SIGNS:  He has a temperature of 99.4, pulse is 100, respiratory rate  20, blood pressure 100/50.  LUNGS:  With decreased breath sounds bilaterally.  CARDIAC:  Tachycardic but regular.  ABDOMEN:  Soft.  He had decreased bowel sounds.  He has no  hepatosplenomegaly.  EXTREMITIES:  No clubbing, cyanosis, or edema.   LABORATORY DATA:  Sodium 131, potassium 3.7, BUN 15, creatinine 0.5, calcium  8.0.  White cell 12.8, hemoglobin 9.4, hematocrit 27, platelet count 130.   ASSESSMENT:  Glen Green is a 70 year old gentleman with a stage III  adenocarcinoma of the esophagus.  He underwent neoadjuvant chemo/radiation  therapy.  He had a very nice response.  The fact that he has only  microscopic areas of disease certainly is quite encouraging to me.   DISCUSSION:  I think that at this point, there is no role  for adjuvant  chemotherapy.  No one has ever defined a role for adjuvant chemotherapy for  esophageal cancer.   The main issue right now with the patient is just getting him through the  postoperative recovery.  He is being followed closely by the CCM and CVTS.  There is really not much that we can offer right now.   PLAN:  We will just plan to follow the patient up as an outpatient.  I would  like to see him back in the office in about six weeks after his discharge.  It seems as if he will likely will not be discharged for quite a while.   I thank Dr. [name] for his expert surgical care for the patient.                                               Rose Phi. Myna Hidalgo, M.D.    PRE/MEDQ  D:  02/16/2003  T:  02/16/2003  Job:  [id number]   cc:   Ines Bloomer, M.D.  [address]  [place]  [place] [address]  Fax: [phone]   Llana Aliment. Malon Kindle., M.D.  [address]  [place]  [place] [address]  Fax: [phone]

## 2011-04-27 NOTE — H&P (Signed)
NAME:  MANNING, LUNA NO.:  [id number]   MEDICAL RECORD NO.:  [id number]                   PATIENT TYPE:  OBV   LOCATION:  0271                                 FACILITY[facility]  [facility]   PHYSICIAN:  Valentino Hue. Magrinat, M.D.            DATE OF BIRTH:  11/17/1941   DATE OF ADMISSION:  04/09/2004  DATE OF DISCHARGE:                                HISTORY & PHYSICAL   Glen Green is a 70 year old man followed by Dr. Myna Hidalgo for a history of  esophageal cancer, initially diagnosed in March, 2004.  The patient received  radiation, chemotherapy, and resection and was tumor-free until late  December when he was found to have recurrence to right adrenal gland.  This  was resected, and with no measurable disease, he has been receiving  carboplatin/Taxotere four weeks on and one week off with his most recent  dose being April 03, 2004.   Last night, the patient had a rigor with the bed shaking and drenching  sweats.  He was shaking so bad I couldn't take my temperature.  Today he  was able to take his temperature in the afternoon, and it was 103 degrees.  He was instructed to come to the emergency room for further evaluation.   PAST MEDICAL HISTORY:  1. Status post cerebral aneurysm repair under Dr. Newell Coral about 10 years     ago.  2. Status post cholecystectomy under Dr. Wenda Low.  3. Status post port placement.   There are no other significant medical problems.   ALLERGIES:  No known drug allergies.   MEDICATIONS:  His only medication currently is Coumadin 1 mg for his port.  He does have Carafate and oxycodone/APAP at home as needed, but he is not  currently requiring that.   REVIEW OF SYSTEMS:  He denies cough, phlegm production, pleurisy, or  shortness of breath.  There have been no unusual headaches.  There have been  no significant visual changes.  No nausea or vomiting.  No balance problems.  There has been no change in bowel or bladder habits (he  has a tendency to  alternate between diarrhea and constipation but that has not changed).  He  has lost about 5 pounds in the last two months and has significant taste  perversion secondary to his chemotherapy.  There has been no dysuria or  hematuria.  No rash.  No bleeding.   PHYSICAL EXAMINATION:  VITAL SIGNS:  Temperature 101.0 in the emergency  room.  Respiratory rate 20.  Pulse 115.  The blood pressure 92/65.  GENERAL:  He is a middle-aged white male.  HEENT:  Sclerae are anicteric.  Oropharynx is clear.  NECK:  Supple.  I do not palpate any peripheral adenopathy.  LUNGS:  No crackles or wheezes.  HEART:  Regular rate and rhythm.  ABDOMEN:  Soft, nontender.  Positive bowel sounds.  EXTREMITIES:  No peripheral edema.  NEUROLOGIC:  Nonfocal, although he does complain of pain below the knees in  the shins.  There is no peripheral edema, as noted.  No erythema.  There is  no focal bony tenderness in that area.   Lab works shows a white cell count of 6.8 and hemoglobin of 10.4, an MVC of  92.5 and a platelet count of 125,000.  Other labs are pending at this time.   FILMS:  Chest x-ray is pending at this time.   IMPRESSION/PLAN:  A 70 year old man with a history of esophageal cancer  initially diagnosed in March, 2004.  Treated with chemotherapy, radiation,  and surgery to a complete response but with recurrence to the right adrenal  gland in late December, 2004, status post resection.  Receiving weekly  Taxotere/carboplatin, four weeks on, two weeks off, since that time.  The  last dose on March 30, 2004.   We are going to obtain blood and urine cultures and obtain a chest x-ray.  Start the patient on Primaxin.  He will be observed overnight.  If he is  stable within the next 24 hours, he may be able to go home, since he is not  neutropenic at present; however, with his low blood pressure, I am concerned  that it is not safe for him to go, as he might collapse at home,  especially  if there is any problem with absorption of oral antibiotics.                                               Valentino Hue. Magrinat, M.D.    Ronna Polio  D:  04/09/2004  T:  04/09/2004  Job:  [id number]   cc:   Rose Phi. Myna Hidalgo, M.D.  [address] [facility]  [place], [place] [address]  Fax: [phone]   Billie Lade, M.D.  [address] [facility]  [place]  [place] [address]  Fax: [phone]   Ines Bloomer, M.D.  [address]  [place]  [place] [address]   Thornton Park Daphine Deutscher, M.D.  [address]  [place]  [place] [address]  Fax: [phone]   Hewitt Shorts, M.D.  [address]  [place]  [place] [address]  Fax: [phone]

## 2011-04-27 NOTE — Discharge Summary (Signed)
NAME:  Glen Green, Glen Green NO.:  [id number]   MEDICAL RECORD NO.:  [id number]                   PATIENT TYPE:  INP   LOCATION:  0276                                 FACILITY:  [facility]   PHYSICIAN:  Lowell C. Catha Gosselin, M.D.               DATE OF BIRTH:  06-20-1941   DATE OF ADMISSION:  12/07/2002  DATE OF DISCHARGE:  12/11/2002                                 DISCHARGE SUMMARY   DIAGNOSES:  1. Febrile neutropenia.  2. Esophageal cancer and chemoradiotherapy with secondary neutropenia.  3. Hypertension secondary to all of the above, now resolved.   SUMMARY:  The patient is a 70 year old male patient with locally advanced  esophageal cancer receiving combined chemoradiotherapy. He had chemotherapy  two weeks prior to admission with cisplatin and 5-FU but had been getting  radiation therapy every weekday. He had been getting weaker, however, and on  the day of admission, he was seen by Dr. Myna Hidalgo with a white count of less  than 400 and temperature of 106 degrees. Blood pressure is 80/40, and he was  admitted to the ICU acutely. Pulse was 120, respirations 22, blood pressure  80/40, and he looked dry. His lungs showed decreased breath sounds at the  bases. Abdomen was soft with decreased bowel sounds but no abdominal masses,  no hepatosplenomegaly. Some muscle atrophy was present.   COURSE IN THE HOSPITAL:  The patient was given fluid resuscitation and  started on Primaxin. Cultures were checked. Interesting enough, chest x-ray  showed mild COPD but no active disease, no infiltrates and no metastasis.  His hypotension quickly resolved again with fluids. He was placed on growth  factors, and due to his overall severe status, gentamycin and vancomycin  were added as well. He had an episode with leg weakness on 12/08/02 which  was thought to be due to some temporary muscle weakness or myopathy from his  high fever. His gentamycin was discontinued. CK was checked  on 12/30 and was  375. Also on 12/09/02, white was 1.6, hemoglobin 11.7, platelet count  58,000, sodium 135, potassium 4, creatinine 0.8. By then, he was day two of  vancomycin, day three of Primaxin. Leg strength at that time was 5/5 that  morning, and he did not have any further problems with leg weakness. He was  complaining of some fever blisters and was put in IV acyclovir and later  changed to p.o. acyclovir. 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   On Neupogen and on antibiotics, his white count was up to 7.4 on 12/10/02, and  on 12/11/02, it was up to 15.6 with a platelet count of 102,000, up from  79,000, with a hemoglobin of 13.9. Sodium was 137, potassium 2.9, creatinine  0.8, calcium 8.   He was ambulating by then, eating well. His outpatient radiation therapy was  resumed under Dr. Kathrynn Running  on 12/11/02. He has approximately 14 more fractions  or about three more weeks to go.   DISPOSITION:  I talked to he and his wife, and given the pneumonia and flu  season, we thought it was safer for him actually to be at home, particularly  with a good white count now and being afebrile several days. Subsequently,  he is being discharged on Zovirax 200 mg p.o. five times a day, Augmentin  875 mg p.o. b.i.d. with meals with seven additional days, Phenergan 25 mg  p.o. q.6h. p.r.n. for nausea, OxyIR 5 mg p.o. q.4h. p.r.n. for pain. He does  not have any positive cultures at the time of his discharge. Diet and  activity will be as tolerated. He will call [phone]  for problems or questions. He will return on Monday to radiation oncology to  resume radiation treatment on 12/14/02 at 9:20 a.m. He will return to see Dr.  Myna Hidalgo at the cancer center 12/18/02 with a CBC and an appointment at 11:15  a.m. At the time of discharge, overall status was improved. Prognosis  guarded.                                               Lowell C. Catha Gosselin, M.D.    LCS/MEDQ  D:  12/11/2002  T:   12/11/2002  Job:  [id number]   cc:   Molli Hazard A. Kathrynn Running, M.D.  [address] [facility]  [place]  [place]  [address]  Fax: [phone]

## 2011-04-27 NOTE — Op Note (Signed)
NAME:  Glen Green, Glen Green NO.:  [id number]   MEDICAL RECORD NO.:  [id number]                   PATIENT TYPE:  INP   LOCATION:  2104                                 FACILITY:  [facility]   PHYSICIAN:  Thornton Park. Daphine Deutscher, M.D.             DATE OF BIRTH:  1941/07/20   DATE OF PROCEDURE:  01/27/2004  DATE OF DISCHARGE:                                 OPERATIVE REPORT   PREOPERATIVE DIAGNOSIS:  Acute abdominal pain.   POSTOPERATIVE DIAGNOSES:  Evidence of biloma that opened and drained, recent  bowel peritonitis.   PROCEDURE:  Exploratory laparotomy, debridement of bowel peritonitis with  culture, and drainage of abdominal cavity.   SURGEON:  Thornton Park. Daphine Deutscher, M.D.   ASSISTANT:  Ollen Gross. Carolynne Edouard, M.D.   FINDINGS:  Evidence of a collection of bile in the lateral aspect, beneath  the liver where the adrenal had been taken off the perihepatic area, with  evidence that this leaked into the abdomen, producing bile peritonitis.   INDICATIONS FOR PROCEDURE:  The patient is a 70 year old gentleman who is  about six days out from a right adrenalectomy for metastatic esophageal  cancer.  Postoperatively he had a drain in for four days.  It was draining  some serosanguineous fluid, but not bile fluid.  He was doing well, and was  discharged.  His first night at home he said he was lying and sleeping with  his left side down, and awoke with a fairly sudden onset of abdominal pain.  He was brought to the emergency room where a workup was undertaken by the  emergency room doctors, and I was later called, and was concerned about  peritonitis.  Two CT scans had shown a slight amount of fluid in the  abdomen.   DESCRIPTION OF PROCEDURE:  The patient was taken to room #16 and given  general anesthesia.  A central line was placed by Dr. Quita Skye. Krista Blue.  The  old wound was opened and this was without difficulty.  A good closure was  present.  Upon entering the abdomen lateral to  the liver, I found an intense  bowel staining which was in a pattern of probably a loculated collection,  which was probably where he had a biloma that had been drained.  There was  then bile seen all along the right gutter and down into the pelvis, and  staining some of the small intestine.  I went ahead and was able to mobilize  the entire small bowel, and I ended up taking down the previous site of a  needle catheter jejunostomy and inspected that, but got that free so that  the small bowel could be run in its entirety.  The area was irrigated with  the Stryker irrigator with several liters of fluid until clear.  There was  no cloudiness or evidence that this looked to be other than bile.  I did not  smell.  After evacuating everything and palpating his colon in its entirety  and looking at the right colon particularly, and also the retroperitoneal  and duodenal sweep, I did not see any evidence of leaks from his duodenum;  however, it looked like this biloma originated from beneath the liver, and  it was my thought that he probably had a little bile leak from the liver  that had been dissected in removing this tumor.  After a thorough irrigation, and everything was placed back into its  anatomic position, I placed a drain to the lower part of the abdomen, down  into the pelvis.  A second drain was used through his previous drain site  and brought into the space that appeared to be the biloma, and a third more  cephalic drain was placed over the liver on the right  side.  These were secured with #3-0 nylons.  The wound was then closed  carefully with #1 PDS in multiple layers, as it had been done before, and it  was closed with staples.  The patient seemed to tolerate the procedure well.  He was taken to the  recovery room in satisfactory condition.                                               Thornton Park Daphine Deutscher, M.D.    MBM/MEDQ  D:  01/27/2004  T:  01/28/2004  Job:  [id number]    cc:   Rose Phi. Myna Hidalgo, M.D.  [address] [facility]  [place], [place] [address]  Fax: [phone]   Ines Bloomer, M.D.  [address]  [place]  [place] [address]

## 2011-04-27 NOTE — Op Note (Signed)
NAME:  Glen Green, Glen Green NO.:  [id number]   MEDICAL RECORD NO.:  [id number]                   PATIENT TYPE:  INP   LOCATION:  2301                                 FACILITY:  [facility]   PHYSICIAN:  Ines Bloomer, M.D.              DATE OF BIRTH:  30-May-1941   DATE OF PROCEDURE:  DATE OF DISCHARGE:                                 OPERATIVE REPORT   PREOPERATIVE DIAGNOSIS:  Esophageal cancer.   POSTOPERATIVE DIAGNOSIS:  Esophageal cancer.   OPERATION PERFORMED:  Transhiatal esophagectomy with cholecystectomy,  jejunostomy, pyloroplasty and removal of right subclavian Port-A-Cath.   SURGEON:  Ines Bloomer, M.D.   Mammie LorenzoThornton Park. Daphine Deutscher, M.D.   FIRST ASSISTANT:  Will James, PA-C.   INDICATIONS FOR PROCEDURE:  This 70 year-old patient was found to have  distal esophageal cancer, adenocarcinoma, and underwent chemotherapy and  radiation.  It was felt that this was a stage 3.  He had an excellent  response to his radiation and his chemotherapy with marked shrinking and was  brought back for esophagogastrectomy.  He was also found on CT scan to have  several stones in his gallbladder, so it was felt that we could do a  cholecystectomy at the same time.  He had a Port-A-Cath placed by the  radiologist and had one episode of infection and there was a little erythema  around it.  Because of this, it was thought it might be good to remove the  Port-A-Cath after the surgery.   DESCRIPTION OF PROCEDURE:  The patient was prepped and draped in the usual  sterile manner and underwent general anesthesia.  The abdomen was explored  and no metastases were seen.  In the lesser omentum there were some nodes  that appeared to be fibrotic; like they might have obtained tumor.  They  were in the area where he had the radiation.  The cholecystectomy was first  performed by Dr. Daphine Deutscher who will dictate that separately and then the  stomach was freed up along  the lesser curvature with electrocautery and then  along the greater curvature with the harmonic scalpel.  Dr. Daphine Deutscher will  dictate this in detail.  The left gastric artery was divided with a stapler.  With the stomach being freed up the dissection was turned to the esophagus  and the hiatus was opened and the esophageal cancer in the distal esophagus  was dissected free throughout.  We ended up off the crura using the harmonic  scalpel to divide all of attachments and all small __________ vessels.  I  then was able to elevate the crura under direct vision. The esophagus was  freed up all the way up to the carina again using the harmonic scalpel  medially, laterally, superiorly and inferiorly in order to free up the  esophagus.  Attention was then turned to the neck and an incision  was made  along the left sternocleidomastoid.  Dissection was carried down through the  subcutaneous tissues down to the sternocleidomastoid which was reflected  laterally.  The jugular vein was identified and the anterior facial vein was  doubly ligated and divided.  The jugular vein was dissected laterally  protecting the vagus nerve and the carotid.  We went posteriorly and were  able to identify the esophagus and the esophagus was dissected up and looped  with umbilical tape.  Then the esophagus was dissected up by sharp  dissection, dissecting down toward the carina. Again the harmonic scalpel  was used to divide several small branches and adhesions to the esophagus.  When the esophagus was finally freed up completely, it was stapled  superiorly with an ATW45 stapler.  The esophagus was pulled down into the  abdomen.  The stomach was divided from the greater curvature down to the  lesser curvature using two applications of the TLC75.  The stomach was then  oriented appropriately so that there would be no twisting and a camera bag  was pulled through the posterior mediastinum.  The stomach was placed in the   camera bag and then pulled superiorly up to the neck. The camera bag was  removed and the stomach was oriented posteriorly and an enterotomy was made  anteriorly.  The stapled end of the esophagus was removed and using an ATW35  this was placed in the posterior esophagus and the anterior wall of the  stomach and fired creating a posterior anastomosis.  After this had been  done the NG tube was pushed down into the stomach, down to the pylorus and  then the rest of the stapling was performed by placing sutures through the  wall and in a train relation using the TL30 stapler.  One side of the  anastomosis using the TL30 stapler was used to staple the other side. After  this had all been done and the excess tissue was removed, several sutures  were used to suture the stomach to the esophagus to prevent any movement of  the esophagus. A Jackson-Pratt drain was then brought in around the  anastomosis through a separate stab wound. The stomach was closed with #1  Vicryl to the muscle layer, 2-0 Vicryl to the subcutaneous tissue.  Dr.  Daphine Deutscher had performed the fundoplasty and he will dictate that separately as  well as the jejunostomy and will also dictate that separately.  After this  had been done the abdomen was closed with a running #1 PDS and staples.  Attention was turned to the Port-A-Cath which was in the right subclavian  area.  It was entered and there was some fluid around the Port-A-Cath which  was cultured.  The Port-A-Cath was dissected out and removed.  The wound was  closed with interrupted 2-0 Vicryl and the subcutaneous tissues.  A dressing  was applied.  During the dissection it appeared that the right pleura had  been entered so the right chest was prepped and draped in the usual sterile  manner. A stab wound was made over the sixth intercostal space, mid axillary  line, and an incision was made and this incision was sutured in place with 2- 0 silk and a #28 chest tube was  inserted and sutured in place with 2-0 silk.  Dry sterile dressings were applied.  The patient was then returned to the  intensive care unit in serious condition.  Ines Bloomer, M.D.    DPB/MEDQ  D:  02/10/2003  T:  02/10/2003  Job:  [id number]   cc:   Rose Phi. Myna Hidalgo, M.D.  [address] [facility]  [place], [place] [address]  Fax: [phone]

## 2011-04-27 NOTE — Op Note (Signed)
NAME:  Glen Green, Glen Green                           ACCOUNT NO.:  [id number]   MEDICAL RECORD NO.:  [id number]                   PATIENT TYPE:  INP   LOCATION:  2550                                 FACILITY:  [facility]   PHYSICIAN:  Thornton Park. Daphine Deutscher, M.D.             DATE OF BIRTH:  1941-01-13   DATE OF PROCEDURE:  01/21/2004  DATE OF DISCHARGE:                                 OPERATIVE REPORT   PREOPERATIVE DIAGNOSIS:  Prior esophagogastrectomy for esophageal carcinoma  with what appears to be isolated right adrenal metastases.   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   PROCEDURE:  Right adrenalectomy.   SURGEON:  Thornton Park. Daphine Deutscher, M.D.   ASSISTANT:  Velora Heckler, M.D.   ANESTHESIA:  General endotracheal anesthesia.   DRAINS:  One 28 Blake drain in the right upper quadrant.   ESTIMATED BLOOD LOSS:  500 mL.   DESCRIPTION OF PROCEDURE:  Evrett Hakim was taken to room 16 on January 21, 2004, and given general anesthesia.  The abdomen was prepped with Betadine  widely and draped sterilely.  A right subcostal incision was made and the  abdomen was entered without difficulty.  There were some adhesions taken  down to the liver and of the omentum to the anterior abdominal wall without  difficulty.  The Bookwalter retractor was used to provide exposure and the  falciform ligament had previously been taken down and the adhesions  generated were re-divided.  The liver was retracted in a cephalad direction  and the colon, which was mobilized, was retracted downward.  We began  dissecting in this superior region taking the periadrenal tissue down and  also mobilizing the inferior vena cava.  This was done and there was a  hepatic branch of the cava that was ligated with 2-0 silk and this enabled  [place] to roll the cava over.  We thus began a fairly tedious dissection getting  the tumor mobilized  medially and posteriorly.  We were able to free this  superiorly and take it off the liver.  I did get into liver capsule and  getting a margin around it as it was probably apparent desmoplastic  reaction.  The Bovie was used to divide this.  We then carried this  inferiorly and we took this off the kidney away from the renal vein.  Again,  using right angle dissection and dividing the periadrenal tissue with the  Bovie, we were able to free it up laterally.  Large and medium clips were  used when small vessels were encountered and certainly posteriorly and  medially were used to divide what were likely small lymphatics as well as  small venous branches.  In the end, the right adrenal was removed without  transecting any gross tumor but this mass was about 5 cm in greatest  diameter.  It was sent for permanent sections.  In the meantime, the tumor  bed was inspected.  No active bleeding was noted after we inspected and used  the cautery.  There was one little venous branch off the renal vein that was  clipped.  Surgicel had been placed beneath the liver.  A 19 Blake drain was  placed in the right upper quadrant and brought out through a separate stab  incision and attached to the skin with a nylon suture.  The area was  irrigated and then the fascia was closed with #1 PDS by layers sewing the  upper portion of the slight Chevron incision to the midline and then using a  separate #1 PDS as we closed this in two layers.  I also injected the  incision initially and at the end with 0.5% Marcaine with epinephrine.  The  skin was closed with the stapler.  The patient seemed to tolerate the  procedure well.  He had TEDs hose during the case.  He was brought to the  recovery room in satisfactory condition.                                               Thornton Park Daphine Deutscher, M.D.    MBM/MEDQ  D:  01/21/2004  T:  01/21/2004  Job:  [id number]   cc:   Rose Phi. Myna Hidalgo, M.D.  [address] [facility]   [place], [place] [address]  Fax: [phone]   Antony Madura, M.D.  [address]  [place]  [place] [address]  Fax: [phone]   Ines Bloomer, M.D.  [address]  [place]  [place] [address]

## 2011-04-27 NOTE — Op Note (Signed)
   NAME:  Glen Green, Glen Green NO.:  [id number]   MEDICAL RECORD NO.:  [id number]                   PATIENT TYPE:  INP   LOCATION:  2301                                 FACILITY:  [facility]   PHYSICIAN:  Shan Levans, M.D. [facility]            DATE OF BIRTH:  Dec 01, 1941   DATE OF PROCEDURE:  02/12/2003  DATE OF DISCHARGE:                                 OPERATIVE REPORT   PROCEDURE:  Bronchoscopy.   INDICATIONS FOR PROCEDURE:  Hypoxemia, mucus plugging.   SURGEON:  Shan Levans, M.D.   ANESTHESIA:  1% Xylocaine local.   PREOP MEDICATIONS:  Versed 3 mg IV push, morphine 2 mg IV push.   DESCRIPTION OF PROCEDURE:  The bedside Olympus portable bronchoscope was  introduced through the endotracheal tube. The airways were visualized and  showed intense tracheobronchitis, thick tenacious yellow mucus plugs were  seen including the right bronchus intermedius. Smaller mucus plugs were seen  in the left lower lobe orifices. The airways were lavaged with Mucomyst 20%  and saline and a bronchoalveolar lavage was obtained from the right lower  lobe and was sent for quantitative cultures.   COMPLICATIONS:  None.    IMPRESSION:  Mucus plugging right bronchus intermedius in a patient status  post esophagogastrectomy with postoperative ventilatory failure and hypoxic  respiratory failure.   RECOMMENDATIONS:  Followup microbiologic data, maintain full vent support.                                               Shan Levans, M.D. [facility]    PW/MEDQ  D:  02/12/2003  T:  02/13/2003  Job:  [id number]   cc:   Ines Bloomer, M.D.  [address]  [place]  [place] [address]  Fax: [phone]   Llana Aliment. Malon Kindle., M.D.  [address]  [place]  [place] [address]  Fax: [phone]

## 2011-04-27 NOTE — Op Note (Signed)
NAME:  Glen Green, Glen Green NO.:  [id number]   MEDICAL RECORD NO.:  [id number]                   PATIENT TYPE:  INP   LOCATION:  2301                                 FACILITY:  [facility]   PHYSICIAN:  Ines Bloomer, M.D.              DATE OF BIRTH:  1941/06/30   DATE OF PROCEDURE:  DATE OF DISCHARGE:                                 OPERATIVE REPORT   POSTOPERATIVE DIAGNOSIS:  Postoperative hemorrhage status post  esophagogastrectomy.   POSTOPERATIVE DIAGNOSIS:  Postoperative hemorrhage status post  esophagogastrectomy.   OPERATION PERFORMED:  Exploratory laparotomy, exploratory thoracotomy for  hemorrhage.   SURGEON:  Ines Bloomer, M.D.   ASSISTANT:  Thornton Park. Daphine Deutscher, M.D.   ANESTHESIA:  General anesthesia   HISTORY:  This patient who had an esophagogastrectomy by Dr. [name] and Dr.  Daphine Deutscher was taken to the intensive care unit and approximately an hour after  arriving there he was very agitated, complaining of a lot of pain and at  that time he started having increased drainage from his right chest tube,  bloody drainage.  He went hypotensive and he was given blood and his  pressure came back up and the bleeding slowed.  His PT protime was 16, he  was given fresh frozen plasma and then he started having more bleeding  again, and because of the continued bleeding and another episode of  hypotension he was taken to the operating room for exploration both of the  abdomen and possibly the right chest.   DESCRIPTION OF PROCEDURE:  After general anesthesia he was prepped and  draped in the usual sterile manner and the abdominal incision was opened and  exploration was carried out.  There was little bleeding in the abdomen, the  stitches holding the stomach to the hiatus were taken down, exposing the  hiatus and the lower portion of the posterior mediastinum was explored and  there was no evidence of any bleeding there, the blood that was there  was  removed.  Because we could not find any bleeding the hiatus was closed with  interrupted 3-0 silks and then the abdomen was closed with running #1 PDS  and Ethicon skin clips.   He was turned in the right lateral thoracotomy position, was prepped and  draped in the usual sterile manner and a posterolateral thoracotomy incision  was made and the latissimus was divided and the serratus was reflected  anteriorly.  The fifth intercostal space was entered and a portion of the  sixth rib was taken subperiosteally at the angle.  Two Tuffier's were placed  at right angles and there was a large amount of clot above the diaphragm,  this was removed and all clot was irrigated free and removed.  After this  was removed then exploration was carried out of the posterior mediastinum  and superiorly right at the area of the hiatus there  were severe small  vessels which we electrocoagulated and then there was one vein that appeared  to be bleeding, this was thought to be the major source of the bleeding and  this was triply clipped.  After this was done the area was packed.  Then the  inferior portion of the mediastinum was explored inferiorly down near the  diaphragm, no further bleeding was found.  Then the pleura  was reapproximated with 3-0 silks and two chest tubes were then placed  through separate stab wounds and sutured into place with 0 silk.  The chest  was closed with four pericostal's of #1 Vicryl in the muscle layer, 2-0  Vicryl in subcutaneous stitch, and skin clips.  The patient was returned to  the intensive care unit in serious condition.                                                   Ines Bloomer, M.D.    DPB/MEDQ  D:  02/10/2003  T:  02/11/2003  Job:  [id number]

## 2011-04-27 NOTE — H&P (Signed)
NAME:  GREYCEN, FELTER NO.:  [id number]   MEDICAL RECORD NO.:  [id number]                   PATIENT TYPE:  INP   LOCATION:  0159                                 FACILITY:  [facility]   PHYSICIAN:  Rose Phi. Myna Hidalgo, M.D.              DATE OF BIRTH:  October 11, 1941   DATE OF ADMISSION:  12/07/2002  DATE OF DISCHARGE:                                HISTORY & PHYSICAL   REASON FOR ADMISSION:  1. Fever.  2. Neutropenia.  3. Hypotension.  4. Esophageal cancer.   HISTORY OF PRESENT ILLNESS:  The patient is a 70 year old gentleman with  locally advanced esophageal cancer.  He was receiving chemotherapy and  radiation therapy.  He has had chemotherapy about two weeks ago.  He  received cisplatin/5-FU.   He has not been eating well recently.  He has been generally weak.  He is  beginning his radiation treatments.  He was down in radiation today and not  doing well.  He had a little bit of a temperature.  He was dehydrated.  He  was sent to the medical day unit.  At the medical day unit he continued to  decline.  Blood counts were done which showed white cell count less than  400. The temperature then spiked to 106.3.  Blood pressure dropped to 80/40.  He now is being admitted because of likely bacteremia/sepsis.   PAST MEDICAL HISTORY:  See old records.   ALLERGIES:  None.   MEDICATIONS:  Nexium and Coumadin.   SOCIAL HISTORY:  History of tobacco use.  No history of alcohol use.   REVIEW OF SYSTEMS:  See above.   PHYSICAL EXAMINATION:  GENERAL:  This is a very ill-appearing white  gentleman very dehydrated.  VITAL SIGNS:  Temperature 106.3, pulse 120, respiratory rate 22, blood  pressure 80/40.  HEENT:  Mucositis on his lips.  Oral mucosa is dry.  NECK:  Supple.  No adenopathy.  LUNGS:  Decreased at the bases.  CARDIAC:  Tachycardic, but regular.  ABDOMEN:  Soft with decreased bowel sounds.  There is no abdominal mass.  There is no hepatosplenomegaly.  EXTREMITIES:  Some muscle atrophy in upper and lower extremities.  NEUROLOGIC:  No focal neurological deficits.  SKIN:  No rashes.   IMPRESSION AND PLAN:  The patient is a 70 year old gentleman with locally  advanced esophageal cancer.  He now is being admitted.  He needs to go to  the intensive care unit.  He is probably septic.  He will get Tylenol in the  medical day unit.  He will have cultures taken.  I will give him Primaxin  and gentamicin.  We will also start Neupogen.   The situation is quite critical.  We should be able to pull him through  this.  Rose Phi. Myna Hidalgo, M.D.    PRE/MEDQ  D:  12/07/2002  T:  12/07/2002  Job:  [id number]

## 2011-04-27 NOTE — Discharge Summary (Signed)
NAME:  Glen Green, Glen Green                           ACCOUNT NO.:  [id number]   MEDICAL RECORD NO.:  [id number]                   PATIENT TYPE:  INP   LOCATION:  5710                                 FACILITY:  [facility]   PHYSICIAN:  Thornton Park. Daphine Deutscher, M.D.             DATE OF BIRTH:  1941-05-14   DATE OF ADMISSION:  01/26/2004  DATE OF DISCHARGE:  02/02/2004                                 DISCHARGE SUMMARY   CHIEF COMPLAINT:  Abdominal pain, sudden onset after surgery.   HOSPITAL COURSE:  This 70 year old, white male was discharged from [facility] on January 25, 2004, about five days after his right  adrenalectomy.  This was for metastatic cancer.  He went home and had rolled  over in bed about 4 a.m. and started having abdominal pain.  I did not get  informed of his presence in the ER until about 4:30 a.m. on February 16, and  I admitted him.  Because of his pain, tenderness and tachycardia, I admitted  him to the ICU.  We observed him for about 16 hours and then CT scan showed  some fluid, but no definite collections.  I went ahead and reoperated on him  on January 27, 2004, and found bile peritonitis with an apparent right  subhepatic biloma which had probably leaked during the night producing the  pain.  I drained him with three drains which I kept in for a number of days.  The one in the pelvis and one over his liver stopped draining.  However, the  one in the bile collection has continued to drain.  HIDA scan was obtained  on February 21, which did not show any evidence of leakage from the liver.  He has been feeling good and is basically asymptomatic.  One of his  peritoneal cultures did show some Klebsiella pneumonia for which he received  Unasyn.  I gave him some bolus of steroids in the perioperative period, but  had no reason to believe that his left adrenal cannot go ahead and function  autonomously at this time.  The patient is eager to go home.  I have left in  the  one bile draining drain and his wife is going to be checking that.  We  will remove his staples today.   FOLLOW UP:  I will see him back in the office in approximately one week.   DISCHARGE DIAGNOSIS:  Status post bile collection after a right  adrenalectomy, status post readmission, reoperation and drainage.                                                Thornton Park Daphine Deutscher, M.D.    MBM/MEDQ  D:  02/02/2004  T:  02/03/2004  Job:  [id number]  cc:   Rose Phi. Myna Hidalgo, M.D.  [address] [facility]  [place], [place] [address]  Fax: [phone]   [facility]

## 2011-04-27 NOTE — Discharge Summary (Signed)
NAME:  Glen Green, Glen Green NO.:  [id number]   MEDICAL RECORD NO.:  [id number]                   PATIENT TYPE:  INP   LOCATION:  0271                                 FACILITY:  [facility]   PHYSICIAN:  Rose Phi. Myna Hidalgo, M.D.              DATE OF BIRTH:  05/02/1941   DATE OF ADMISSION:  04/09/2004  DATE OF DISCHARGE:  04/11/2004                                 DISCHARGE SUMMARY   CONDITION ON DISCHARGE:  Fair.   HISTORY OF PRESENT ILLNESS:  [name] is a 70 year old gentleman followed by  Dr. Myna Hidalgo for esophageal cancer diagnosed in March 2004.  He received  radiation, chemotherapy, and resection and was tumor-free until December.  At that time, he was found to have recurrence of the right adrenal gland.  This was resected with no measurable disease, and he has been receiving  carboplatin/Taxotere four weeks on and one week off, since that time.  His  last dose was given on April 03, 2004.   Prior to admission, he began having rigor with the bed shaking and drenching  sweats.  Today, his temperature is 103.  He was instructed to come into the  emergency room.   During his hospital stay, he was treated with IV vancomycin and pain  medication.  He was treated with Primaxin.   PROCEDURES:  IV fluids.   CONSULTATIONS:  Nutritional consult.   LABORATORY DATA:  Urinalysis was done, it was negative.  Admission labs  showed a WBC of 6.8, hemoglobin 10.4, hematocrit 30.8, platelets 125.  Absolute neutrophils were at 80.  Blood cultures were positive for gram  positive cocci in clusters.  Again, urine was negative.   DISCHARGE MEDICATIONS:  1. Coumadin 1 mg to take once a day.  2. Nexium 40 mg one a day.  3. Percocet as needed for pain.  Mr. [name] has all of these already at home.  1. Vancomycin 1250 mg q.12h. for 21 days.   PAIN MANAGEMENT:  As prior to hospitalization.   ACTIVITY:  No restrictions.   DIET:  No restrictions.   WOUND CARE:  Not applicable  except for IV site.   SPECIAL INSTRUCTIONS:  He is to call with a temperature over 101.   FOLLOWUP:  He is to follow up with his regularly scheduled appointment with  Dr. Arlan Organ.  [facility] was called regarding his IV  antibiotics.     Rosemarie Ax, N.P.                      Rose Phi. Myna Hidalgo, M.D.    Cordelia Pen  D:  04/11/2004  T:  04/11/2004  Job:  [id number]

## 2011-04-27 NOTE — Discharge Summary (Signed)
Glen Green, Glen Green                 ACCOUNT NO.:  [id number]   MEDICAL RECORD NO.:  [id number]          PATIENT TYPE:  INP   LOCATION:  1331                         FACILITY:  [facility]   PHYSICIAN:  Hind I Eda Paschal, MD      DATE OF BIRTH:  1941-03-13   DATE OF ADMISSION:  08/15/2007  DATE OF DISCHARGE:  08/19/2007                               DISCHARGE SUMMARY   DISCHARGE DIAGNOSES:  1. 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  2. Esophageal cancer status post total esophagectomy.  3. Operative radiation and chemotherapy 5 years ago.  4. Abnormal CAT scan finding with no evidence of metastasis.  5. Gastroesophageal reflux disease.  6. Dyslipidemia.  7. Hypothyroidism.  8. Status post cerebral aneurysm repair.  9. Status post cholecystectomy.  10.Status post Port-A-Cath placement with concomitant sepsis in 2005.   MEDICATIONS:  1. Synthroid.  2. Lipitor.  3. Prilosec.  4. Aspirin.   DISCHARGE MEDICATIONS:  1. Synthroid 50 mcg p.o. daily.  2. __prevacid  20 mg po daily.  3. Aspirin 81 mg p.o. daily.   CONSULTATIONS:  Gastroenterology consulted for evaluation of abdominal  pain, nausea and vomiting.  Oncology consulted for the abnormal CT finding.  Surgery consulted for possible exploratory laparotomy to decide about  the abnormal mass on the CT finding.   PROCEDURES:  1. CT abdomen and pelvis which shows a new ring like thickening along      the inferior surface of the left hemidiaphragm which is worrisome      for metastasis.  2. Chest x-ray - left lower lobe atelectasis consolidation, possible      pulmonary effusion.  3. Abdominal x-ray negative for bowel obstruction.  4. Left diaphragmatic tumor with elevation of the left hemidiaphragm,      left basil atelectasis. A small left pleural effusion.  5. Esophagogastroduodenoscopy during admission which did not show any      evidence of metastasis.  with no obvious cancer.  6. Biopsy of the proximal  esophagus was done with results showing      benign gastrectomy with mild chronic inflammation consistent .  7. Status post biopsy of the proximal esophagus.result pending   HISTORY OF PRESENT ILLNESS:  This is a 70 year old male with a history  of esophageal cancer in 2004, status post esophagectomy with  chemotherapy and radiation.  He had been in remission since 2004.  He  was admitted to the hospital with abdominal pain and nausea and  vomiting.  Previous CT scan showed evidence of bronchial thickening,  worse with the evidence of metastasis.   ASSESSMENT AND PLAN:  Abdominal pain, nausea and vomiting.  The patient  was admitted to the hospital for therapy with IV fluids and pain  medication.  Patient was kept n.p.o. and a repeat abdominal x-ray was  done which did not show any evidence of acute bowel obstruction.  During  hospitalization his nausea and vomiting completely resolved with the  above management.  Oncology was consulted to evaluate the possibility of  metastasis.  He recommended gastroenterology to evaluate with  EGD.  An  EGD was done which did not show any evidence of cancer.  Status post  biopsy of the proximal  Esophagus.  During hospitalization patient remained on a regular diet  with no evidence of nausea and vomiting.  As above, CT scan showed  possibility of left sub diaphragmatic mass.  Surgery was consulted to  evaluate if patient should undergo surgery and obtain a biopsy from the  mass.  Surgerydid nott recommend any surgery and there opinion most  probably secondary to hernia but no emergency to intervene as the  patient is asymptomatic..  They recommended patient follow with Dr.  [name] after 1 week, and to evaluate the abnormal CT scan.  An  appointment was obtained to see Dr. [name] in his office and to possibly  repeat another CT scan if there is any doubt about the cancer.  The above recommendation was discussed with Dr. Myna Hidalgo and he agreed  with the  plan.   On the day of discharge patient was without any abdominal pain and  without nausea and vomiting.   DISPOSITION:  The patient was discharged home with his home medication  and as he was not in any pain a fentanyl patch was stopped.  An  appointment with Dr. [name] was given.      Hind Bosie Helper, MD  Electronically Signed     HIE/MEDQ  D:  09/01/2007  T:  09/01/2007  Job:  [id number]   cc:   Rose Phi. Myna Hidalgo, M.D.  Fax: [phone]   Ines Bloomer, M.D.  [address]  [place]  [place] [address]

## 2011-04-27 NOTE — Op Note (Signed)
NAME:  Glen Green, Glen Green                           ACCOUNT NO.:  [id number]   MEDICAL RECORD NO.:  [id number]                   PATIENT TYPE:  AMB   LOCATION:  ENDO                                 FACILITY:  [facility]   PHYSICIAN:  James L. Malon Kindle., M.D.          DATE OF BIRTH:  [birth year]   DATE OF PROCEDURE:  11/25/2003  DATE OF DISCHARGE:                                 OPERATIVE REPORT   PROCEDURE:  Esophagogastroduodenoscopy.   MEDICATIONS:  Cetacaine spray, Fentanyl 45 mcg, Versed 5 mg IV.   INDICATIONS:  The patient was a previous esophageal resection back in the  spring of this year with gastric pull-up procedure.  He has had some  increasing dysphagia.  This is done to evaluate this area.  He has had  radiation and chemotherapy as well.   DESCRIPTION OF PROCEDURE:  The procedure had been explained to the patient  and consent obtained.  The patient in the left lateral decubitus position.  The Olympus endoscope was inserted and advanced.  The plan was to do  esophageal dilation, if indicated.  The patient had a very short segment of  esophagus, estimated to be approximately 5 cm, and then anastomosis was  seen.  It was not narrowed and, in fact, the staples were clearly seen and  it was not strictured.  The scope easily passed.  It was somewhat friable.  There was no obvious ulceration, no obvious tumor.  The stomach was entered.  The gastric outlet was identified and passed.  It was normal.  The duodenum  was seen and was normal.  The scope was withdrawn back in the stomach and  the stomach was repositioned and was not abnormal up to the anastomosis.  This area was examined.  There was nothing really to dilate so I elected not  to proceed with the dilation.  The area was somewhat friable but there was  no obvious tumor mass, ulceration, so there was nothing in particular to  biopsy.  The scope was withdrawn.  Again, there was an approximately 5 cm  segment of esophagus  remaining.  The scope was withdrawn.  The patient  tolerated the procedure well.   ASSESSMENT:  Dysphagia, probably due to motility issues without frank  stricture.   PLAN:  Will add Carafate slurry to his medical regimen and see him back in  my office in two months.  Will keep him on soft foods.                                               James L. Malon Kindle., M.D.    Waldron Session  D:  11/25/2003  T:  11/25/2003  Job:  [id number]   cc:   Ines Bloomer, M.D.  [address]  [place]  [place] [address]   Rose Phi. Myna Hidalgo, M.D.  [address] [facility]  [place], [place] [address]  Fax: [phone]

## 2011-04-27 NOTE — H&P (Signed)
NAME:  Glen Green, FIELDHOUSE                        ACCOUNT NO.:  [id number]   MEDICAL RECORD NO.:  [id number]                    PATIENT TYPE:  INP   LOCATION:                                       FACILITY:  [facility]   PHYSICIAN:  Ines Bloomer, M.D.              DATE OF BIRTH:  August 17, 1941   DATE OF ADMISSION:  02/10/2003  DATE OF DISCHARGE:                                HISTORY & PHYSICAL   REFERRED BY:  1. James L. Randa Evens, M.D.  2. Lowell C. Catha Gosselin, M.D.  3. Billie Lade, M.D., radiation oncology.   CHIEF COMPLAINT:  Adenocarcinoma of the esophagus.   HISTORY OF PRESENT ILLNESS:  The patient is a 70 year old white male with a  six to seven-week history of dysphagia, who was evaluated by Dr. Carman Ching and found to have a stage III esophageal cancer.  He subsequently  was referred to [facility] and to Dr. Ines Bloomer.  At that point he was then  referred to Dr. Catha Gosselin and Dr. Roselind Messier, and subsequently had undergone  radiation therapy with a total of 5040 cGy and chemotherapy with cisplatin  and 5-fluorouracil.  The patient's has lost between  24-30 pounds during  this treatment with radiation and chemotherapy.  Followup by Dr. [name]  after the completion of his radiation and chemotherapy and repeat CT scan,  revealed a decrease in the node size.  At this point it was Dr. Scheryl Darter  opinion that he should undergo an esophagogastrectomy via the transhiatal  approach along with a cholecystectomy, jejunostomy and pyloroplasty.  The  risks and benefits were discussed with the patient in detail, and an  informed operative consent was obtained.  He was admitted for surgery at  this time.   PAST MEDICAL HISTORY:  1. 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  2. He had sepsis, secondary to Port-a-Cath infection in December of last     year.  He had had no surgery prior to this, except for the placement of     his Port-a-Cath.   MEDICATIONS:  1. None.   He was on Coumadin 1 mg daily.  This was stopped on February 02, 2003.  2. At home he has the following p.r.n. medicines which he has not used for     over two weeks, including oxycodone p.r.n.  3. Compazine 10 mg q.4h. p.r.n.  4. Ativan 1 mg q.4h. p.r.n.  5. Acyclovir p.r.n. fever blisters.  6. The patient completed his antibiotics for his Port-a-Cath infection two     weeks ago.   ALLERGIES:  No known drug allergies.   REVIEW OF SYSTEMS:  A 30-pound weight loss with chemotherapy and radiation  therapy, but he is now regained his appetite and has gained up to 15 pounds  of those 30 pounds which he lost.  He has  no history of diabetes mellitus,  kidney disease, asthma, chronic obstructive pulmonary disease, and no  history of TIA, CVA.  No history of coronary artery disease, myocardial  infarction.  No history of pulmonary embolus or deep vein thrombosis.  No  history of GI bleed.  He was told that his cancer was secondary to  gastroesophageal reflux disease, but he denies any symptoms prior to getting  sick.  GENITOURINARY:  Negative.  PULMONARY:  He denies shortness of breath,  dyspnea on exertion, PND, orthopnea, or chronic or intermittent cough.   FAMILY HISTORY:  His mother died at age 52 of a cerebral aneurysm.  Father  died at age 40 with renal failure, and one aunt is deceased from colon  cancer, and one sister who is living at age 76, with a history of melanoma.   SOCIAL HISTORY:  The patient is married for 38 years.  He had two children,  a son age 76 and a daughter age 44, in good health, and grandchildren.  He  has worked most of his life.  He is still in the shipping and receiving  department.  He used to drink a six-pack a day for 20 years, but he quit 20-  25 years ago.  Cigarettes:  He smokes one pack a day for at least 45 years.  He tried to quit when he was diagnosed in November of last year, but states  he is down to about three cigarettes per day now.    PHYSICAL EXAMINATION:  GENERAL:  This is a well-nourished, thin white male,  in no acute distress.  He is alert and oriented.  VITAL SIGNS:  Blood pressure 98/70 in the right arm and 92/70 in the left  arm sitting, pulse 100 and regular, respirations 18 and unlabored.  HEENT:  Head normocephalic.  Eyes:  Pupils equal, round, reactive to light  and accommodation.  EOMs intact.  Fundi not visualized.  Ears, nose, throat,  mouth are grossly within normal limits.  NECK:  Supple, no jugular venous distention, no bruits, no thyromegaly.  CHEST:  Symmetrical, clear to auscultation and percussion.  Respirations are  unlabored.  The right chest has a Port-a-Cath which shows the lateral half  to be red and nontender.  There is no complaint of tenderness or discomfort  with the patient and no fever.  HEART:  A regular rate and rhythm.  No murmurs, rubs, or gallops.  ABDOMEN:  Soft, nontender.  Positive bowel sounds.  No palpable  organomegaly.  GENITOURINARY:  Deferred.  RECTAL:  Deferred.  EXTREMITIES:  No clubbing, cyanosis, or edema.  SKIN:  Normal hair pattern.  Pulses are +2 and equal bilaterally throughout  their distribution.  NEUROLOGIC:  No focal deficits.   IMPRESSION:  1. Adenocarcinoma of the esophagus, status post radiation and chemotherapy.  2. History of tobacco abuse.  3. History of cerebral aneurysm with bleed in 1994.   PLAN:  The patient is admitted electively at this time for a transhiatal  esophagogastrectomy, cholecystectomy, pyloroplasty and jejunostomy.          Eber Hong, P.A.                 Ines Bloomer, M.D.    WDJ/MEDQ  D:  02/08/2003  T:  02/08/2003  Job:  [id number]

## 2011-05-22 ENCOUNTER — Ambulatory Visit
Admission: RE | Admit: 2011-05-22 | Discharge: 2011-05-22 | Disposition: A | Discharge status: Home / Self Care | Payer: Medicare Other | Source: Ambulatory Visit | Attending: Thoracic Surgery | Admitting: Thoracic Surgery

## 2011-05-22 ENCOUNTER — Ambulatory Visit (INDEPENDENT_AMBULATORY_CARE_PROVIDER_SITE_OTHER): Payer: Medicare Other | Admitting: Thoracic Surgery

## 2011-05-22 ENCOUNTER — Ambulatory Visit: Payer: Self-pay | Admitting: Thoracic Surgery

## 2011-05-22 DIAGNOSIS — C3490 Malignant neoplasm of unspecified part of unspecified bronchus or lung: Secondary | ICD-10-CM

## 2011-05-22 DIAGNOSIS — C159 Malignant neoplasm of esophagus, unspecified: Secondary | ICD-10-CM

## 2011-05-22 MED ORDER — IOHEXOL 300 MG/ML  SOLN
100.0000 mL | Freq: Once | INTRAMUSCULAR | Status: AC | PRN
  Administered 2011-05-22: 100 mL via INTRAVENOUS

## 2011-05-23 NOTE — Assessment & Plan Note (Signed)
OFFICE VISIT  Glen Green, SAO DOB:  Aug 31, 1941                                        May 22, 2011 CHART #:  [id number]  The patient came today for followup of the chest and abdominal CT. The patient's blood pressure was 131/86, pulse 70, respirations 20, sats were 100.  Chest x-ray showed no evidence of recurrence of his lungs. CT scan showed recurrence of his lung or esophageal cancer and has no more problems as far as herniation of his abdominal small bowel into the chest.  So, he is doing well overall.  We will see him back again in 6 months with another CT scan, but unfortunately there is not official reading on this, and if there is any change on the final reading, we will let him know.  Ines Bloomer, M.D. Electronically Signed  DPB/MEDQ  D:  05/22/2011  T:  05/23/2011  Job:  [id number]  cc:   Josph Macho, M.D.

## 2011-08-09 IMAGING — CT CT ABDOMEN W/ CM
2 of 5 series · 11 of 36 positions shown, 18 images · IV contrast (READICAT/WATER & [ID] OMNI 300)
Comparison: CT scan 12/06/2010.

CT CHEST

CLINICAL DATA: Esophageal cancer.

CT CHEST AND ABDOMEN WITH CONTRAST
TECHNIQUE: Multidetector CT imaging of the chest and abdomen was
performed following the standard protocol during bolus
administration of intravenous contrast.
Contrast: 100 ml of Dmnipaque-JPP.

[Series 601: coronal body · coronal · 0.95mm/px · 1 of 115 slices shown, 2 images]
[im 39/115  soft-tissue]
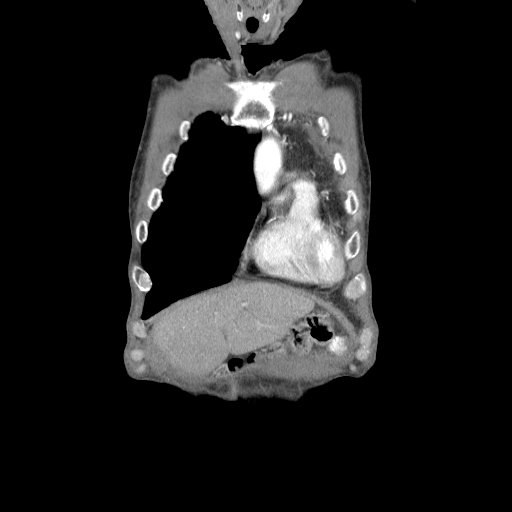
[im 39/115  bone]
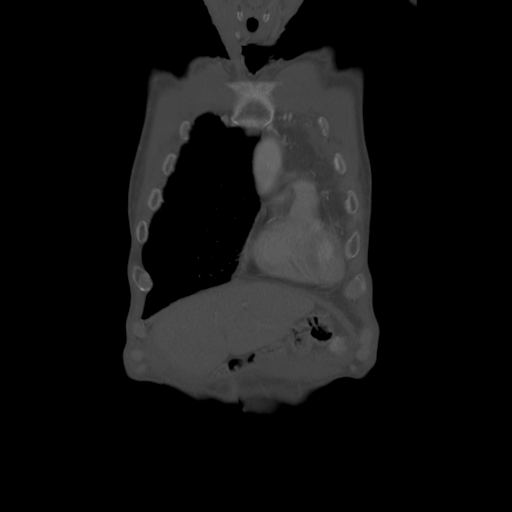

[Series 602: sagittal body · sagittal · 0.95mm/px · 10 of 133 slices shown, 16 images]
[im 13/133  soft-tissue]
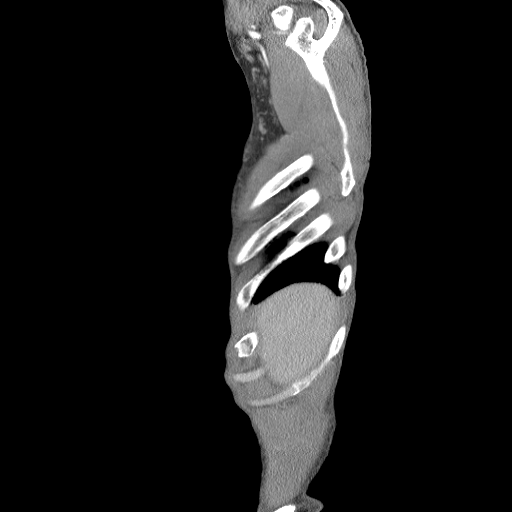
[im 13/133  lung]
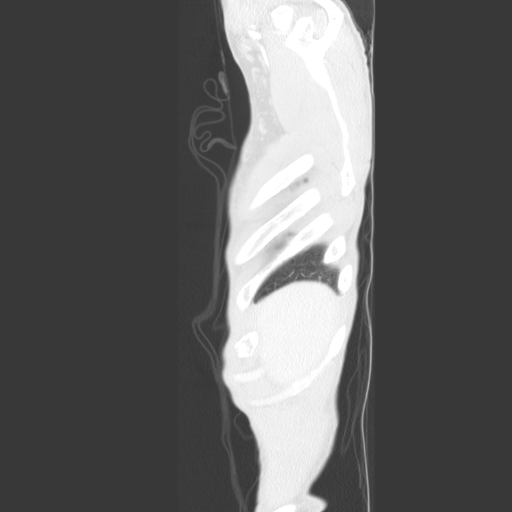
[im 13/133  bone]
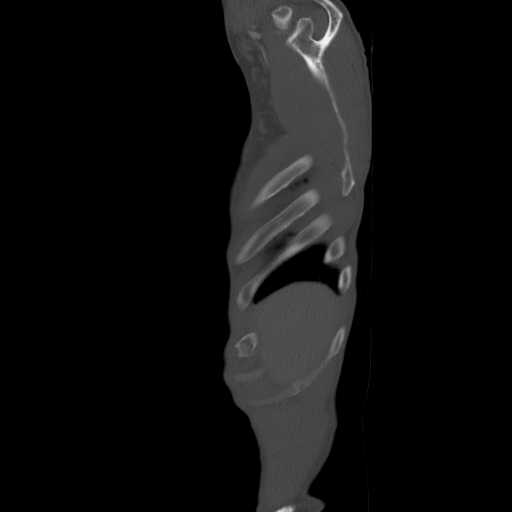
[im 25/133  soft-tissue]
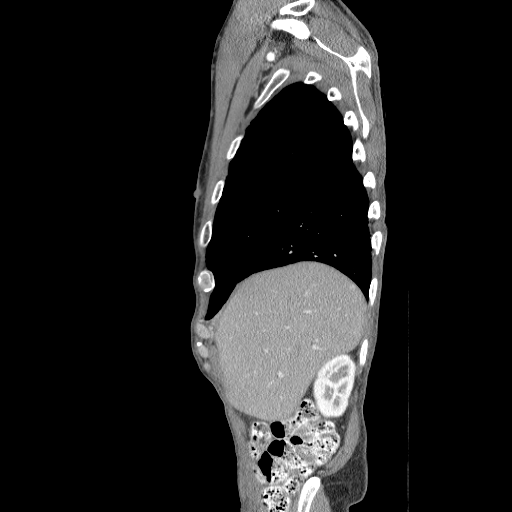
[im 25/133  lung]
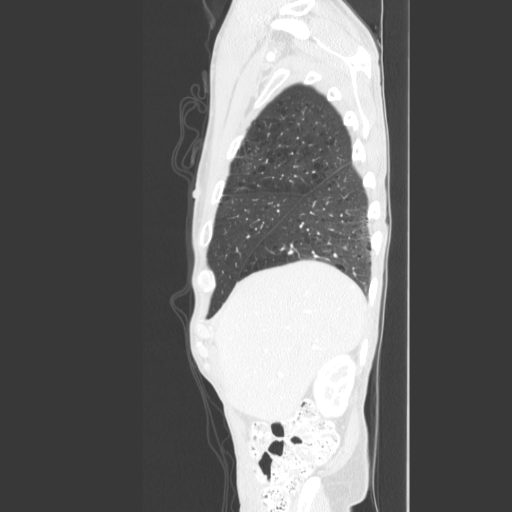
[im 37/133  soft-tissue]
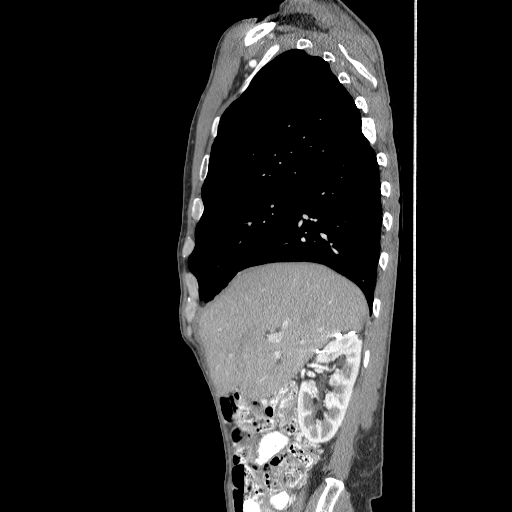
[im 37/133  lung]
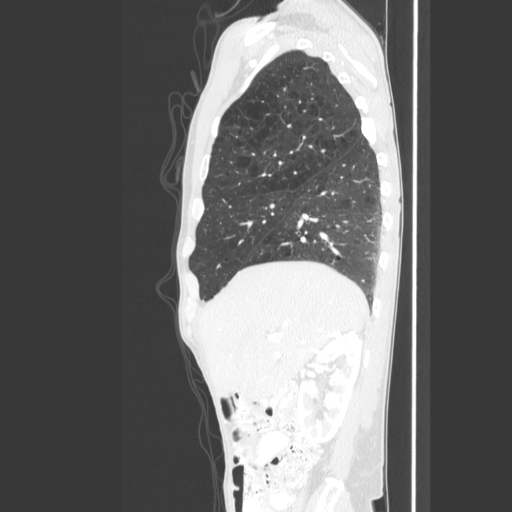
[im 49/133  soft-tissue]
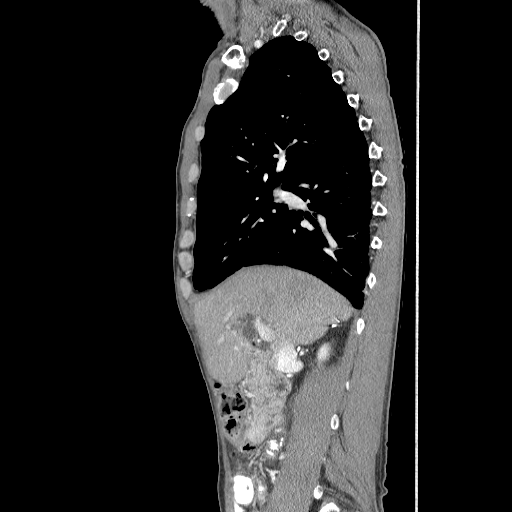
[im 49/133  lung]
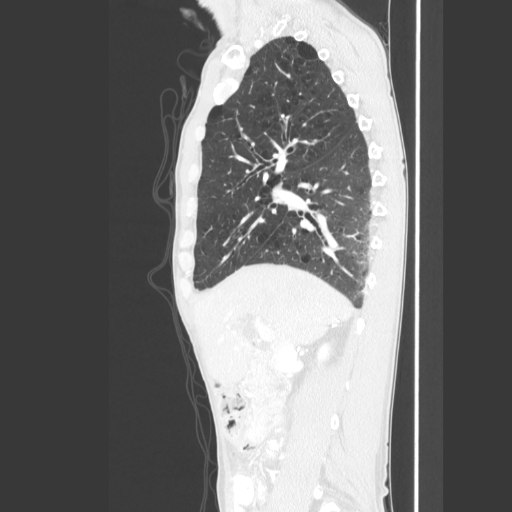
[im 61/133  soft-tissue]
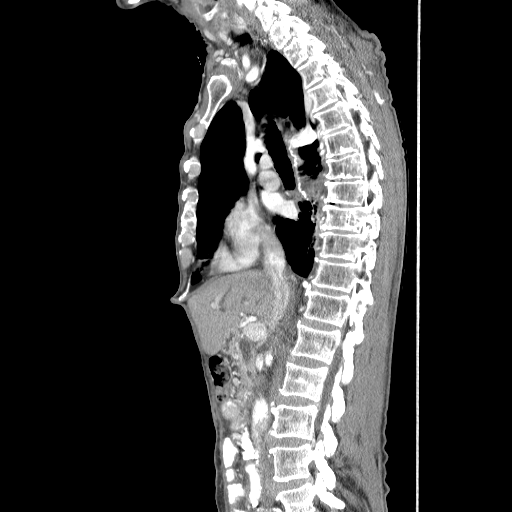
[im 73/133  soft-tissue]
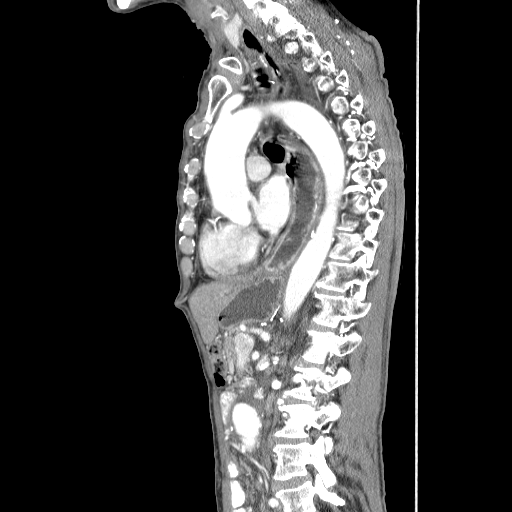
[im 85/133  soft-tissue]
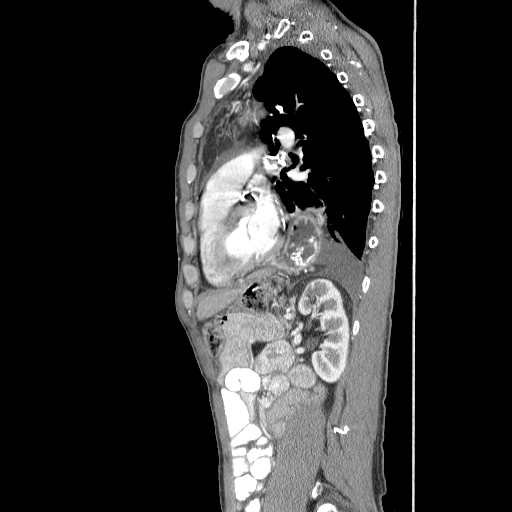
[im 97/133  soft-tissue]
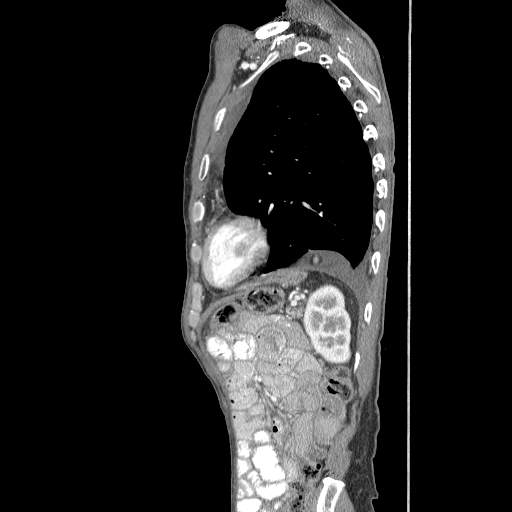
[im 109/133  soft-tissue]
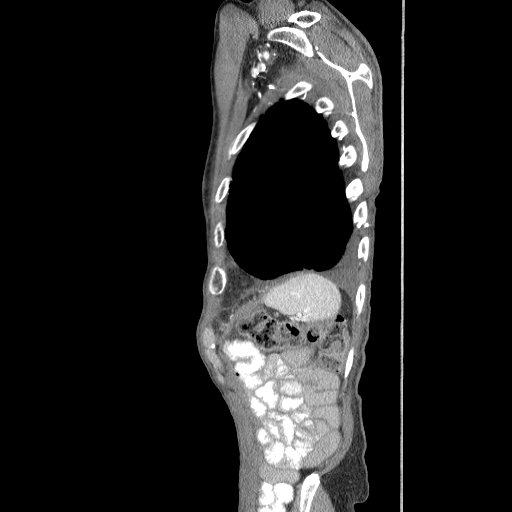
[im 109/133  bone]
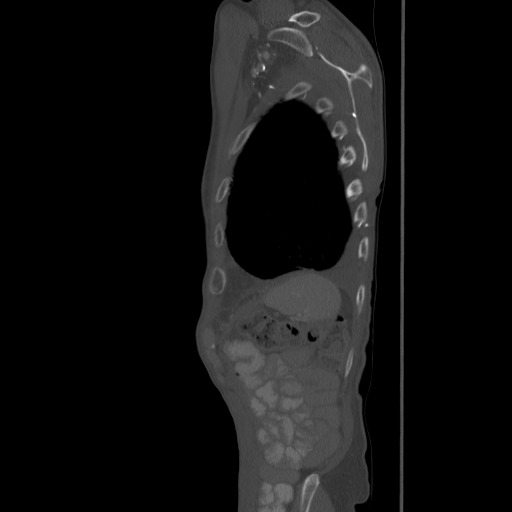
[im 121/133  soft-tissue]
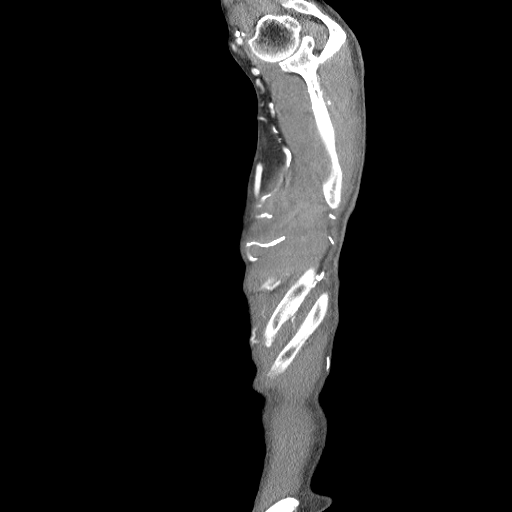

[11 of 36 positions shown; findings below may reference images not displayed]

FINDINGS: The chest wall is unremarkable and stable.  No
supraclavicular or axillary lymphadenopathy.  The bony thorax is
stable.  No destructive bony changes or spinal canal compromise.
Remote surgical changes from a thoracotomy.

The heart is normal in size.  No pericardial effusion.  Stable
surgical changes from a gastric pull-through procedure.  A large
lateral esophageal diverticulum is noted on the left near the
thoracic inlet.

No mediastinal or hilar lymphadenopathy. Stable fusiform aneurysmal
dilatation of the ascending thoracic aorta with maximal
measurements of 3.8 x 3.8 cm.  Stable atherosclerotic changes and
stable coronary artery calcifications.

Examination of the lung parenchyma demonstrates stable
emphysematous changes.  No acute pulmonary findings or worrisome
pulmonary nodules.  No pleural effusion.  Stable surgical changes
from a left upper lobe lobectomy.
IMPRESSION: 1.  Stable postoperative changes from a gastric pull-through
procedure and the left upper lobe lobectomy.
2.  No CT findings for recurrent tumor or metastatic disease.
3.  Stable fusiform ascending thoracic aortic aneurysm.
4.  Stable emphysematous changes.  No acute pulmonary findings.

CT ABDOMEN
FINDINGS: The liver is unremarkable and stable.  Stable common
bile duct dilatation status post cholecystectomy.  Mild
intrahepatic ductal dilatation is also noted.  The spleen is normal
in size.  No focal lesions.  The pancreas is grossly normal.  Mild
atrophy.  The adrenal glands and kidneys demonstrate no significant
abnormalities.  There is a stable right renal cyst.

Infrarenal abdominal aortic aneurysm is slightly larger, measuring
3.4 x 3.3 cm.  No dissection.
IMPRESSION: 1.  No CT findings for abdominal metastatic disease.
2.  Persistent mild common bile duct dilatation.
3.  Infrarenal abdominal aortic aneurysm as discussed above.

## 2011-09-21 LAB — DIFFERENTIAL
Basophils Absolute: 0
Basophils Absolute: 0
Basophils Relative: 0
Eosinophils Absolute: 0.1
Eosinophils Relative: 1
Eosinophils Relative: 1
Lymphocytes Relative: 10 — ABNORMAL LOW
Lymphocytes Relative: 10 — ABNORMAL LOW
Lymphs Abs: 1
Monocytes Absolute: 0.5
Monocytes Absolute: 0.6
Monocytes Relative: 5
Monocytes Relative: 6
Neutro Abs: 8.4 — ABNORMAL HIGH
Neutro Abs: 8.9 — ABNORMAL HIGH
Neutrophils Relative %: 84 — ABNORMAL HIGH

## 2011-09-21 LAB — COMPREHENSIVE METABOLIC PANEL WITH GFR
ALT: 28
AST: 25
Albumin: 3.4 — ABNORMAL LOW
Alkaline Phosphatase: 67
BUN: 14
CO2: 28
Calcium: 8.8
Chloride: 106
Creatinine, Ser: 0.97
GFR calc non Af Amer: >60
Glucose, Bld: 131 — ABNORMAL HIGH
Potassium: 3.7
Sodium: 140
Total Bilirubin: 1
Total Protein: 6.3

## 2011-09-21 LAB — URINALYSIS, ROUTINE W REFLEX MICROSCOPIC
Glucose, UA: NEGATIVE
Hgb urine dipstick: NEGATIVE
Nitrite: NEGATIVE
Protein, ur: NEGATIVE
Specific Gravity, Urine: 1.027
Urobilinogen, UA: 0.2
pH: 5

## 2011-09-21 LAB — BASIC METABOLIC PANEL
Calcium: 9.7
Chloride: 103
Creatinine, Ser: 1
GFR calc Af Amer: >60
GFR calc non Af Amer: >60

## 2011-09-21 LAB — CBC
Hemoglobin: 12.3 — ABNORMAL LOW
MCV: 94
Platelets: 220
Platelets: 232
Platelets: 267
RBC: 3.86 — ABNORMAL LOW
RBC: 4.45
RDW: 14.8 — ABNORMAL HIGH
WBC: 10.1
WBC: 10.6 — ABNORMAL HIGH
WBC: 10.6 — ABNORMAL HIGH

## 2011-09-21 LAB — BASIC METABOLIC PANEL WITH GFR
BUN: 18
CO2: 26
Calcium: 8.6
Chloride: 106
Creatinine, Ser: 0.87
GFR calc non Af Amer: >60
Glucose, Bld: 249 — ABNORMAL HIGH
Potassium: 3.8
Sodium: 138

## 2011-09-21 LAB — HEPATIC FUNCTION PANEL
ALT: 27
AST: 29
Alkaline Phosphatase: 79
Bilirubin, Direct: 0.2
Indirect Bilirubin: 0.8
Total Bilirubin: 1

## 2011-09-21 LAB — TYPE AND SCREEN
ABO/RH(D): A POS
Antibody Screen: NEGATIVE

## 2011-09-21 LAB — ABO/RH: ABO/RH(D): A POS

## 2011-09-21 LAB — LIPASE, BLOOD: Lipase: 16

## 2011-09-21 LAB — CEA: CEA: 1.3

## 2011-09-21 LAB — PROTIME-INR
INR: 1
Prothrombin Time: 13.8

## 2011-09-21 LAB — B-NATRIURETIC PEPTIDE (CONVERTED LAB): Pro B Natriuretic peptide (BNP): 55.8

## 2011-09-21 LAB — APTT: aPTT: 28

## 2011-10-25 ENCOUNTER — Other Ambulatory Visit: Payer: Self-pay | Admitting: Thoracic Surgery

## 2011-10-25 DIAGNOSIS — C159 Malignant neoplasm of esophagus, unspecified: Secondary | ICD-10-CM

## 2011-11-26 ENCOUNTER — Other Ambulatory Visit: Payer: Self-pay | Admitting: Thoracic Surgery

## 2011-11-27 DIAGNOSIS — I671 Cerebral aneurysm, nonruptured: Secondary | ICD-10-CM | POA: Insufficient documentation

## 2011-11-27 DIAGNOSIS — Z8501 Personal history of malignant neoplasm of esophagus: Secondary | ICD-10-CM | POA: Insufficient documentation

## 2011-11-27 DIAGNOSIS — Z85118 Personal history of other malignant neoplasm of bronchus and lung: Secondary | ICD-10-CM | POA: Insufficient documentation

## 2011-11-27 DIAGNOSIS — E039 Hypothyroidism, unspecified: Secondary | ICD-10-CM | POA: Insufficient documentation

## 2011-11-27 DIAGNOSIS — E785 Hyperlipidemia, unspecified: Secondary | ICD-10-CM | POA: Insufficient documentation

## 2011-11-28 ENCOUNTER — Ambulatory Visit
Admission: RE | Admit: 2011-11-28 | Discharge: 2011-11-28 | Disposition: A | Discharge status: Home / Self Care | Payer: Medicare Other | Source: Ambulatory Visit | Attending: Thoracic Surgery | Admitting: Thoracic Surgery

## 2011-11-28 ENCOUNTER — Encounter: Payer: Self-pay | Admitting: Thoracic Surgery

## 2011-11-28 ENCOUNTER — Ambulatory Visit (INDEPENDENT_AMBULATORY_CARE_PROVIDER_SITE_OTHER): Payer: Medicare Other | Admitting: Thoracic Surgery

## 2011-11-28 VITALS — BP 129/84 | HR 69 | Resp 20 | Ht 70.0 in | Wt 134.0 lb

## 2011-11-28 DIAGNOSIS — Z8501 Personal history of malignant neoplasm of esophagus: Secondary | ICD-10-CM

## 2011-11-28 DIAGNOSIS — C159 Malignant neoplasm of esophagus, unspecified: Secondary | ICD-10-CM

## 2011-11-28 DIAGNOSIS — Z85118 Personal history of other malignant neoplasm of bronchus and lung: Secondary | ICD-10-CM

## 2011-11-28 MED ORDER — IOHEXOL 300 MG/ML  SOLN
100.0000 mL | Freq: Once | INTRAMUSCULAR | Status: AC | PRN
  Administered 2011-11-28: 100 mL via INTRAVENOUS

## 2011-11-28 NOTE — Progress Notes (Signed)
HPI the patient returns for followup of esophageal cancer and lung cancer. CT scan of the chest and abdomen showed no evidence of recurrence of his cancer. He is a 4.1 cm abdominal aneurysm which is stable. He's had no major medical problems since we saw him last we will see him back in 6 months for further followup.   No current outpatient prescriptions on file.   No current facility-administered medications for this visit.   Facility-Administered Medications Ordered in Other Visits  Medication Dose Route Frequency Provider Last Rate Last Dose  . iohexol (OMNIPAQUE) 300 MG/ML solution 100 mL  100 mL Intravenous Once PRN Medication Radiologist   100 mL at 11/28/11 0931     Review of Systems: Review unchanged   Physical Exam lungs were clear attestation percussion   Diagnostic Tests: CT scan was stable with no evidence of recurrence. There was a stable abdominal aortic aneurysm. A small left pleural effusion.   Impression: Status post resection of esophageal cancer and stage I left upper lobe lung cancer   Plan: Return in 6 months with CT scan

## 2011-12-05 ENCOUNTER — Inpatient Hospital Stay (HOSPITAL_COMMUNITY)
Admission: EM | Admit: 2011-12-05 | Discharge: 2011-12-10 | DRG: 194 | Disposition: A | Discharge status: Home / Self Care | Payer: Medicare Other | Attending: Internal Medicine | Admitting: Internal Medicine

## 2011-12-05 ENCOUNTER — Emergency Department (HOSPITAL_COMMUNITY): Payer: Medicare Other

## 2011-12-05 ENCOUNTER — Other Ambulatory Visit: Payer: Self-pay

## 2011-12-05 ENCOUNTER — Encounter (HOSPITAL_COMMUNITY): Payer: Self-pay | Admitting: Emergency Medicine

## 2011-12-05 DIAGNOSIS — E039 Hypothyroidism, unspecified: Secondary | ICD-10-CM | POA: Diagnosis present

## 2011-12-05 DIAGNOSIS — E785 Hyperlipidemia, unspecified: Secondary | ICD-10-CM | POA: Diagnosis present

## 2011-12-05 DIAGNOSIS — E860 Dehydration: Secondary | ICD-10-CM

## 2011-12-05 DIAGNOSIS — E876 Hypokalemia: Secondary | ICD-10-CM

## 2011-12-05 DIAGNOSIS — R531 Weakness: Secondary | ICD-10-CM

## 2011-12-05 DIAGNOSIS — Z8501 Personal history of malignant neoplasm of esophagus: Secondary | ICD-10-CM

## 2011-12-05 DIAGNOSIS — Z87891 Personal history of nicotine dependence: Secondary | ICD-10-CM

## 2011-12-05 DIAGNOSIS — E440 Moderate protein-calorie malnutrition: Secondary | ICD-10-CM | POA: Diagnosis present

## 2011-12-05 DIAGNOSIS — I671 Cerebral aneurysm, nonruptured: Secondary | ICD-10-CM

## 2011-12-05 DIAGNOSIS — J189 Pneumonia, unspecified organism: Secondary | ICD-10-CM

## 2011-12-05 DIAGNOSIS — E871 Hypo-osmolality and hyponatremia: Secondary | ICD-10-CM | POA: Diagnosis present

## 2011-12-05 DIAGNOSIS — J1100 Influenza due to unidentified influenza virus with unspecified type of pneumonia: Principal | ICD-10-CM | POA: Diagnosis present

## 2011-12-05 DIAGNOSIS — Z79899 Other long term (current) drug therapy: Secondary | ICD-10-CM

## 2011-12-05 DIAGNOSIS — J14 Pneumonia due to Hemophilus influenzae: Secondary | ICD-10-CM | POA: Diagnosis present

## 2011-12-05 DIAGNOSIS — Z85118 Personal history of other malignant neoplasm of bronchus and lung: Secondary | ICD-10-CM

## 2011-12-05 DIAGNOSIS — R0902 Hypoxemia: Secondary | ICD-10-CM

## 2011-12-05 HISTORY — DX: Pneumonia, unspecified organism: J18.9

## 2011-12-05 HISTORY — DX: Malignant (primary) neoplasm, unspecified: C80.1

## 2011-12-05 HISTORY — DX: Cerebral infarction, unspecified: I63.9

## 2011-12-05 LAB — COMPREHENSIVE METABOLIC PANEL
ALT: 92 U/L — ABNORMAL HIGH (ref 0–53)
AST: 185 U/L — ABNORMAL HIGH (ref 0–37)
AST: 159 U/L — ABNORMAL HIGH (ref 0–37)
Albumin: 2.5 g/dL — ABNORMAL LOW (ref 3.5–5.2)
Albumin: 2.5 g/dL — ABNORMAL LOW (ref 3.5–5.2)
CO2: 30 mEq/L (ref 19–32)
Calcium: 9 mg/dL (ref 8.4–10.5)
Calcium: 8.9 mg/dL (ref 8.4–10.5)
Creatinine, Ser: 0.86 mg/dL (ref 0.50–1.35)
Creatinine, Ser: 0.83 mg/dL (ref 0.50–1.35)
GFR calc non Af Amer: 86 mL/min — ABNORMAL LOW (ref >90)
GFR calc non Af Amer: 88 mL/min — ABNORMAL LOW (ref >90)
Sodium: 131 mEq/L — ABNORMAL LOW (ref 135–145)
Total Protein: 6.8 g/dL (ref 6.0–8.3)
Total Protein: 6.8 g/dL (ref 6.0–8.3)

## 2011-12-05 LAB — URINALYSIS, ROUTINE W REFLEX MICROSCOPIC
Hgb urine dipstick: NEGATIVE
Protein, ur: 30 mg/dL — AB
Urobilinogen, UA: 4 mg/dL — ABNORMAL HIGH (ref 0.0–1.0)

## 2011-12-05 LAB — PROTIME-INR
INR: 1.1 (ref 0.00–1.49)
Prothrombin Time: 14.4 seconds (ref 11.6–15.2)

## 2011-12-05 LAB — DIFFERENTIAL
Basophils Absolute: 0 10*3/uL (ref 0.0–0.1)
Basophils Absolute: 0 10*3/uL (ref 0.0–0.1)
Basophils Relative: 0 % (ref 0–1)
Eosinophils Absolute: 0 10*3/uL (ref 0.0–0.7)
Eosinophils Relative: 0 % (ref 0–5)
Eosinophils Relative: 0 % (ref 0–5)
Lymphocytes Relative: 3 % — ABNORMAL LOW (ref 12–46)
Lymphs Abs: 0.3 10*3/uL — ABNORMAL LOW (ref 0.7–4.0)
Monocytes Absolute: 0.5 10*3/uL (ref 0.1–1.0)
Monocytes Relative: 5 % (ref 3–12)
Neutro Abs: 9.9 10*3/uL — ABNORMAL HIGH (ref 1.7–7.7)

## 2011-12-05 LAB — CBC
HCT: 39.6 % (ref 39.0–52.0)
HCT: 40 % (ref 39.0–52.0)
Hemoglobin: 13.4 g/dL (ref 13.0–17.0)
MCH: 31.4 pg (ref 26.0–34.0)
MCHC: 34.3 g/dL (ref 30.0–36.0)
MCV: 91.5 fL (ref 78.0–100.0)
MCV: 92.2 fL (ref 78.0–100.0)
RBC: 4.34 MIL/uL (ref 4.22–5.81)
RDW: 14.2 % (ref 11.5–15.5)
RDW: 14.3 % (ref 11.5–15.5)
WBC: 10.7 10*3/uL — ABNORMAL HIGH (ref 4.0–10.5)

## 2011-12-05 LAB — CK TOTAL AND CKMB (NOT AT ARMC)
CK, MB: 1.3 ng/mL (ref 0.3–4.0)
Relative Index: 0.9 (ref 0.0–2.5)

## 2011-12-05 LAB — INFLUENZA PANEL BY PCR (TYPE A & B)
H1N1 flu by pcr: NOT DETECTED
Influenza B By PCR: NEGATIVE

## 2011-12-05 LAB — PHOSPHORUS: Phosphorus: 2.6 mg/dL (ref 2.3–4.6)

## 2011-12-05 LAB — URINE MICROSCOPIC-ADD ON

## 2011-12-05 LAB — CARDIAC PANEL(CRET KIN+CKTOT+MB+TROPI)
CK, MB: 2 ng/mL (ref 0.3–4.0)
Total CK: 163 U/L (ref 7–232)

## 2011-12-05 MED ORDER — IPRATROPIUM BROMIDE 0.02 % IN SOLN
0.5000 mg | Freq: Four times a day (QID) | RESPIRATORY_TRACT | Status: DC | PRN
  Administered 2011-12-05: 0.5 mg via RESPIRATORY_TRACT
  Filled 2011-12-05 (×2): qty 2.5

## 2011-12-05 MED ORDER — DEXTROSE 5 % IV SOLN
500.0000 mg | INTRAVENOUS | Status: DC
  Administered 2011-12-06 – 2011-12-10 (×5): 500 mg via INTRAVENOUS
  Filled 2011-12-05 (×5): qty 500

## 2011-12-05 MED ORDER — ALBUTEROL SULFATE (5 MG/ML) 0.5% IN NEBU
2.5000 mg | INHALATION_SOLUTION | Freq: Four times a day (QID) | RESPIRATORY_TRACT | Status: DC | PRN
  Administered 2011-12-05: 2.5 mg via RESPIRATORY_TRACT
  Filled 2011-12-05: qty 0.5

## 2011-12-05 MED ORDER — ALBUTEROL SULFATE (5 MG/ML) 0.5% IN NEBU
2.5000 mg | INHALATION_SOLUTION | Freq: Four times a day (QID) | RESPIRATORY_TRACT | Status: DC | PRN
  Administered 2011-12-05: 2.5 mg via RESPIRATORY_TRACT
  Filled 2011-12-05 (×2): qty 0.5

## 2011-12-05 MED ORDER — SODIUM CHLORIDE 0.9 % IV SOLN
Freq: Once | INTRAVENOUS | Status: AC
  Administered 2011-12-05: 09:00:00 via INTRAVENOUS

## 2011-12-05 MED ORDER — SODIUM CHLORIDE 0.9 % IV SOLN
INTRAVENOUS | Status: DC
  Administered 2011-12-05 – 2011-12-08 (×6): via INTRAVENOUS

## 2011-12-05 MED ORDER — LEVOTHYROXINE SODIUM 50 MCG PO TABS
50.0000 ug | ORAL_TABLET | Freq: Every day | ORAL | Status: DC
  Administered 2011-12-05 – 2011-12-10 (×6): 50 ug via ORAL
  Filled 2011-12-05 (×6): qty 1

## 2011-12-05 MED ORDER — DEXTROSE 5 % IV SOLN
1.0000 g | Freq: Once | INTRAVENOUS | Status: AC
  Administered 2011-12-05: 1 g via INTRAVENOUS
  Filled 2011-12-05: qty 10

## 2011-12-05 MED ORDER — DEXTROSE 5 % IV SOLN
500.0000 mg | Freq: Once | INTRAVENOUS | Status: AC
  Administered 2011-12-05: 500 mg via INTRAVENOUS
  Filled 2011-12-05: qty 500

## 2011-12-05 MED ORDER — ENOXAPARIN SODIUM 30 MG/0.3ML ~~LOC~~ SOLN
30.0000 mg | SUBCUTANEOUS | Status: DC
  Administered 2011-12-05 – 2011-12-09 (×4): 30 mg via SUBCUTANEOUS
  Filled 2011-12-05 (×6): qty 0.3

## 2011-12-05 MED ORDER — OSELTAMIVIR PHOSPHATE 75 MG PO CAPS
75.0000 mg | ORAL_CAPSULE | Freq: Two times a day (BID) | ORAL | Status: DC
  Administered 2011-12-05 – 2011-12-10 (×10): 75 mg via ORAL
  Filled 2011-12-05 (×11): qty 1

## 2011-12-05 MED ORDER — DEXTROSE 5 % IV SOLN
1.0000 g | INTRAVENOUS | Status: DC
  Administered 2011-12-06 – 2011-12-10 (×5): 1 g via INTRAVENOUS
  Filled 2011-12-05 (×5): qty 10

## 2011-12-05 MED ORDER — SODIUM CHLORIDE 0.9 % IV SOLN
INTRAVENOUS | Status: DC
  Administered 2011-12-05: 12:00:00 via INTRAVENOUS

## 2011-12-05 NOTE — ED Notes (Signed)
md aware of chest xray, states he will put orders in for antibiotics.

## 2011-12-05 NOTE — ED Notes (Signed)
Pt unable to urinate at this time.  Urinal at bedside and liquids being encouraged.

## 2011-12-05 NOTE — H&P (Signed)
PCP:  Lorenda Peck, MD, MD   DOA:  12/05/2011  7:49 AM  Chief Complaint:  Generalized weakness  HPI: Patient is a 70 year old male with history significant for esophageal cancer status post chemotherapy in 2009, lung cancer status post left upper lobectomy, hypothyroidism who presents to the emergency room with complaints of generalized weakness for one week prior to the admission. Patient also complains of having progressively worsening shortness of breath associated with cough productive of whitish sputum, subjective fever at home, chills. These symptoms started about one week prior to the admission. Patient initially thought he had the flu and has tried symptomatic treatment with Tylenol and cough syrup but with little symptomatic relief. As per patient's wife patient's appetite has significantly decreased since he has been feeling just generalized weakness and flulike symptoms. Patient has no complaints of chest pain other than the pain when he coughs. No complaints of palpitations, no lightheadedness or dizziness, no loss of consciousness. Patient has no complaints of abdominal pain. He does report having nausea and vomiting, one episode in the emergency room, nonbloody. She reports no blood in the stool or urine, no dysuria or hematuria.  Allergies: No Known Allergies  Prior to Admission medications   Medication Sig Start Date End Date Taking? Authorizing Provider  levothyroxine (SYNTHROID) 50 MCG tablet Take 50 mcg by mouth daily.     Yes Historical Provider, MD    Past Medical History  Diagnosis Date  . Dyslipidemia   . History of esophageal cancer     s/p transhiatal esophagogastrectomy  . Hypothyroidism   . Cerebral aneurysm     [place]. repair  1994  . History of lung cancer   . Pneumonia     Past Surgical History  Procedure Date  . Left upper lobectomy with node dissection 02/24/2010    [place]  . Exploratory laparotomy, lysis of adhesions, reduce of incarcerated  small bowel and colon from the chest, limited small bowel resection, incidental appendectomy, and then repair of diaphragmatic hernia with alloderm mesh 10/27/2009    [place]  . Left subclavian port- a-cath insertion 03/09/2004    [place]  . Exploratory laparotomy,exploratory thoracotomy for hemorrhage 02/11/2003    [place]  . Transhiatal esophagectomy with cholecystectomy, jejunostomy,  pyloroplasty and removal of right subclavian port-a- cath     [name]    Social History:  reports that he quit smoking about 8 years ago. His smoking use included Cigarettes. He has never used smokeless tobacco. He reports that he does not drink alcohol or use illicit drugs.  History reviewed. No pertinent family history.  Review of Systems:  Constitutional: Subjective fever, chills, no diaphoresis; positive for fatigue and weight loss since 2003. HEENT: Denies photophobia, eye pain, redness, hearing loss, ear pain, congestion, sore throat, rhinorrhea, sneezing, mouth sores, trouble swallowing, neck pain, neck stiffness and tinnitus.   Respiratory: Positive for shortness of breath and chest tightness when coughing, subjective fever  Cardiovascular: Denies chest pain, palpitations and leg swelling.  Gastrointestinal: positive for nausea and vomitin, no abdominal pain, diarrhea, constipation, blood in stool and abdominal distention.  Genitourinary: Denies dysuria, urgency, frequency, hematuria, flank pain and difficulty urinating.  Musculoskeletal: Denies myalgias, back pain, joint swelling, arthralgias and gait problem.  Skin: Denies pallor, rash and wound.  Neurological: Denies dizziness, seizures, syncope, weakness, light-headedness, numbness and headaches.  Hematological: Denies adenopathy. Easy bruising, personal or family bleeding history  Psychiatric/Behavioral: Denies suicidal ideation, mood changes, confusion, nervousness, sleep disturbance and agitation   Physical Exam:  Filed Vitals:   12/05/11  0900 12/05/11 1000 12/05/11 1030 12/05/11 1129  BP: 122/64 113/68 110/62 122/67  Pulse: 95 98 95 108  Temp:      TempSrc:      Resp: 17 27 27 18   SpO2: 93% 94% 94% 93%    Constitutional: Vital signs reviewed.  Patient is in mild respiratory distress but cooperative with exam. Alert and oriented x3.  Head: Normocephalic and atraumatic Ear: TM normal bilaterally Mouth: no erythema or exudates, MMM Eyes: PERRL, EOMI, conjunctivae normal, No scleral icterus.  Neck: Supple, Trachea midline normal ROM, No JVD, mass, thyromegaly, or carotid bruit present.  Cardiovascular: RRR, S1 normal, S2 normal, no MRG, pulses symmetric and intact bilaterally Pulmonary/Chest: rhonchi diffuse; some wheezing appreciated over upper and mid lung lobes Abdominal: Soft. Non-tender, non-distended, bowel sounds are normal, no masses, organomegaly, or guarding present.  GU: no CVA tenderness Musculoskeletal: No joint deformities, erythema, or stiffness, ROM full and no nontender Ext: no edema and no cyanosis, pulses palpable bilaterally (DP and PT) Hematology: no cervical, inginal, or axillary adenopathy.  Neurological: A&O x3, Strenght is normal and symmetric bilaterally, cranial nerve II-XII are grossly intact, no focal motor deficit, sensory intact to light touch bilaterally.  Skin: Warm, dry and intact. No rash, cyanosis, or clubbing.  Psychiatric: Normal mood and affect. speech and behavior is normal. Judgment and thought content normal. Cognition and memory are normal.   Labs on Admission:  Results for orders placed during the hospital encounter of 12/05/11 (from the past 48 hour(s))  CBC     Status: Normal   Collection Time   12/05/11  8:58 AM      Component Value Range Comment   WBC 9.2  4.0 - 10.5 (K/uL)    RBC 4.33  4.22 - 5.81 (MIL/uL)    Hemoglobin 13.6  13.0 - 17.0 (g/dL)    HCT 40.9  81.1 - 91.4 (%)    MCV 91.5  78.0 - 100.0 (fL)    MCH 31.4  26.0 - 34.0 (pg)    MCHC 34.3  30.0 - 36.0 (g/dL)      RDW 78.2  95.6 - 15.5 (%)    Platelets 207  150 - 400 (K/uL)   DIFFERENTIAL     Status: Abnormal   Collection Time   12/05/11  8:58 AM      Component Value Range Comment   Neutrophils Relative 88 (*) 43 - 77 (%)    Neutro Abs 8.1 (*) 1.7 - 7.7 (K/uL)    Lymphocytes Relative 6 (*) 12 - 46 (%)    Lymphs Abs 0.5 (*) 0.7 - 4.0 (K/uL)    Monocytes Relative 6  3 - 12 (%)    Monocytes Absolute 0.5  0.1 - 1.0 (K/uL)    Eosinophils Relative 0  0 - 5 (%)    Eosinophils Absolute 0.0  0.0 - 0.7 (K/uL)    Basophils Relative 0  0 - 1 (%)    Basophils Absolute 0.0  0.0 - 0.1 (K/uL)   COMPREHENSIVE METABOLIC PANEL     Status: Abnormal   Collection Time   12/05/11  8:58 AM      Component Value Range Comment   Sodium 131 (*) 135 - 145 (mEq/L)    Potassium 4.2  3.5 - 5.1 (mEq/L)    Chloride 94 (*) 96 - 112 (mEq/L)    CO2 29  19 - 32 (mEq/L)    Glucose, Bld 110 (*) 70 - 99 (mg/dL)  BUN 24 (*) 6 - 23 (mg/dL)    Creatinine, Ser 1.61  0.50 - 1.35 (mg/dL)    Calcium 9.0  8.4 - 10.5 (mg/dL)    Total Protein 6.8  6.0 - 8.3 (g/dL)    Albumin 2.5 (*) 3.5 - 5.2 (g/dL)    AST 096 (*) 0 - 37 (U/L)    ALT 103 (*) 0 - 53 (U/L)    Alkaline Phosphatase 220 (*) 39 - 117 (U/L)    Total Bilirubin 0.7  0.3 - 1.2 (mg/dL)    GFR calc non Af Amer 86 (*) >90 (mL/min)    GFR calc Af Amer >90  >90 (mL/min)   CK TOTAL AND CKMB     Status: Normal   Collection Time   12/05/11  8:59 AM      Component Value Range Comment   Total CK 149  7 - 232 (U/L)    CK, MB 1.3  0.3 - 4.0 (ng/mL)    Relative Index 0.9  0.0 - 2.5    TROPONIN I     Status: Normal   Collection Time   12/05/11  8:59 AM      Component Value Range Comment   Troponin I <0.30  <0.30 (ng/mL)     Radiological Exams on Admission: No results found.  Assessment/Plan  Principal Problem:   *WEAKNESS GENERALIZED - likely secondary to dehydration - monitor BUN/ Cr - continue IV fluids NS @ 125 cc/hr - PT/OT evaluation  Active Problems:    PNEUMONIA - likely community acquired pneumonia - follow up the final results of blood cultures - follow up flu test result - continue azithromycin and ceftriaxone for now - nebulizer treatment as needed for shortness of breath - oxygen support with 2 L nasal canula   DEHYDRATION - provide IV fluids with NS @ 125 cc/hr  EDUCATION - patient is aware of plan of care and treatment  DISPOSITION - admit to hospitalist service team 5   Time Spent on Admission: Greater than 30 minutes   Hiroto Saltzman 12/05/2011, 12:31 PM

## 2011-12-05 NOTE — ED Notes (Signed)
Wife and pt reports that pt started with n/v and became weak on Thursday. Wife reports that pt has had decrease PO intake x 3-4 days.

## 2011-12-05 NOTE — ED Notes (Signed)
Attempted to call report to [phone], charge nurse requesting for 10 min as unable to take report at this time.

## 2011-12-05 NOTE — ED Provider Notes (Signed)
History     CSN: [id number]  Arrival date & time 12/05/11  4098   First MD Initiated Contact with Patient 12/05/11 [phone]      Chief Complaint  Patient presents with  . Nausea    x 1 week  . Emesis    x 1 week  . Weakness    x 1 week    (Consider location/radiation/quality/duration/timing/severity/associated sxs/prior treatment) HPI Comments: Patient with history of esophageal cancer treated nine years ago, "double pneumonia" one year ago.  Presents complaining of weakness, fever, cough that has been worsening over the past few days.  Now too weak to stand and walk.  Not eating or drinking for the past few days as well.  Wife thought he had "the flu".   Patient is a 70 y.o. male presenting with weakness. The history is provided by the patient.  Weakness The primary symptoms include dizziness, fever and nausea. Primary symptoms do not include headaches, syncope, loss of consciousness, altered mental status or vomiting. The symptoms began 3 to 5 days ago. The neurological symptoms are diffuse.  Dizziness also occurs with nausea and weakness. Dizziness does not occur with vomiting.  Additional symptoms include weakness.    Past Medical History  Diagnosis Date  . Dyslipidemia   . History of esophageal cancer     s/p transhiatal esophagogastrectomy  . Hypothyroidism   . Cerebral aneurysm     [place] repair  1994  . History of lung cancer   . Pneumonia     Past Surgical History  Procedure Date  . Left upper lobectomy with node dissection 02/24/2010    [place]  . Exploratory laparotomy, lysis of adhesions, reduce of incarcerated small bowel and colon from the chest, limited small bowel resection, incidental appendectomy, and then repair of diaphragmatic hernia with alloderm mesh 10/27/2009    [place]  . Left subclavian port- a-cath insertion 03/09/2004    [place]  . Exploratory laparotomy,exploratory thoracotomy for hemorrhage 02/11/2003    [place]  . Transhiatal esophagectomy with  cholecystectomy, jejunostomy,  pyloroplasty and removal of right subclavian port-a- cath     [facility]    History reviewed. No pertinent family history.  History  Substance Use Topics  . Smoking status: Former Smoker    Types: Cigarettes    Quit date: 12/10/2002  . Smokeless tobacco: Never Used  . Alcohol Use: No      Review of Systems  Constitutional: Positive for fever, chills, appetite change and fatigue.  HENT: Positive for congestion.   Respiratory: Positive for cough and shortness of breath.   Cardiovascular: Negative for chest pain, palpitations, leg swelling and syncope.  Gastrointestinal: Positive for nausea. Negative for vomiting.  Neurological: Positive for dizziness and weakness. Negative for loss of consciousness and headaches.  Psychiatric/Behavioral: Negative for altered mental status.  All other systems reviewed and are negative.    Allergies  Review of patient's allergies indicates no known allergies.  Home Medications   Current Outpatient Rx  Name Route Sig Dispense Refill  . LEVOTHYROXINE SODIUM 50 MCG PO TABS Oral Take 50 mcg by mouth daily.        There were no vitals taken for this visit.  Physical Exam  Nursing note and vitals reviewed. Constitutional: He is oriented to person, place, and time.       Thin, ill-appearing, but in no acute distress.  HENT:  Head: Normocephalic and atraumatic.  Right Ear: External ear normal.       Mucous membranes are dry.  Eyes: Pupils are equal, round, and reactive to light.  Neck: Normal range of motion. Neck supple.  Cardiovascular: Normal rate and regular rhythm.   No murmur heard. Pulmonary/Chest: Effort normal. No respiratory distress. He has rales.       Bilaterally.  In left greater than right.  Abdominal: Soft. Bowel sounds are normal. He exhibits no distension. There is no tenderness.  Musculoskeletal: Normal range of motion. He exhibits no edema.  Lymphadenopathy:    He has no cervical  adenopathy.  Neurological: He is alert and oriented to person, place, and time. No cranial nerve deficit.  Skin: Skin is warm.       Poor turgor.    ED Course  Procedures (including critical care time)  Labs Reviewed - No data to display No results found.   No diagnosis found.    MDM  No elevation of total wbc, but does have a left shift, is hypoxic, and the xray shows obvious pneumonia.  Will consult medicine for admission.          Geoffery Lyons, MD 12/05/11 1154

## 2011-12-05 NOTE — ED Notes (Signed)
Bed changed to 3700

## 2011-12-05 NOTE — ED Notes (Signed)
Attempted to give report to 3700 for new bed assignment of 3740 an states unable to take report at this time. State they will call me back.

## 2011-12-06 LAB — CARDIAC PANEL(CRET KIN+CKTOT+MB+TROPI)
CK, MB: 2.7 ng/mL (ref 0.3–4.0)
Relative Index: 1.1 (ref 0.0–2.5)
Total CK: 247 U/L — ABNORMAL HIGH (ref 7–232)

## 2011-12-06 LAB — BASIC METABOLIC PANEL
CO2: 28 mEq/L (ref 19–32)
Calcium: 8.2 mg/dL — ABNORMAL LOW (ref 8.4–10.5)
Creatinine, Ser: 0.71 mg/dL (ref 0.50–1.35)
GFR calc non Af Amer: >90 mL/min (ref >90)
Glucose, Bld: 106 mg/dL — ABNORMAL HIGH (ref 70–99)

## 2011-12-06 LAB — CBC
MCH: 30.7 pg (ref 26.0–34.0)
MCV: 91.6 fL (ref 78.0–100.0)
Platelets: 238 10*3/uL (ref 150–400)
RDW: 14.5 % (ref 11.5–15.5)

## 2011-12-06 LAB — URINE CULTURE: Culture: NO GROWTH

## 2011-12-06 LAB — TSH: TSH: 2.041 u[IU]/mL (ref 0.350–4.500)

## 2011-12-06 MED ORDER — POTASSIUM CHLORIDE CRYS ER 20 MEQ PO TBCR
40.0000 meq | EXTENDED_RELEASE_TABLET | Freq: Once | ORAL | Status: AC
  Administered 2011-12-06: 40 meq via ORAL
  Filled 2011-12-06: qty 2

## 2011-12-06 NOTE — Progress Notes (Signed)
Patient ID: [place], male   DOB: 1941/01/12, 70 y.o.   MRN: [id number] Subjective: No events overnight. Patient denies chest pain, shortness of breath, abdominal pain.   Objective:  Vital signs in last 24 hours:  Filed Vitals:   12/05/11 1418 12/05/11 1508 12/05/11 2300 12/06/11 0500  BP: 108/56 117/67 126/81 99/63  Pulse: 95 106 98 82  Temp:  102.5 F (39.2 C) 99.1 F (37.3 C) 99 F (37.2 C)  TempSrc:   Oral Oral  Resp: 16 22 19 20   Height:  5\' 10"  (1.778 m)    Weight:  58.1 kg (128 lb 1.4 oz)  57.471 kg (126 lb 11.2 oz)  SpO2: 94% 92% 96% 97%    Intake/Output from previous day:   Intake/Output Summary (Last 24 hours) at 12/06/11 1447 Last data filed at 12/06/11 0500  Gross per 24 hour  Intake      0 ml  Output    600 ml  Net   -600 ml    Physical Exam: General: Alert, awake, oriented x3, in no acute distress. HEENT: No bruits, no goiter. Moist mucous membranes, no scleral icterus, no conjunctival pallor. Heart: Regular rate and rhythm, S1/S2 +, no murmurs, rubs, gallops. Lungs: bilateral rhonchi over upper and mid lobes Abdomen: Soft, nontender, nondistended, positive bowel sounds. Extremities: No clubbing or cyanosis, no pitting edema,  positive pedal pulses. Neuro: Grossly nonfocal.  Lab Results:  Basic Metabolic Panel:    Component Value Date/Time   NA 132* 12/06/2011 0550   K 3.3* 12/06/2011 0550   CL 97 12/06/2011 0550   CO2 28 12/06/2011 0550   BUN 23 12/06/2011 0550   CREATININE 0.71 12/06/2011 0550   CREATININE 1.03 11/26/2011 0810   GLUCOSE 106* 12/06/2011 0550   CALCIUM 8.2* 12/06/2011 0550   CBC:    Component Value Date/Time   WBC 12.9* 12/06/2011 0550   WBC 5.0 05/10/2008 0822   HGB 11.7* 12/06/2011 0550   HGB 14.5 05/10/2008 0822   HCT 34.9* 12/06/2011 0550   HCT 42.8 05/10/2008 0822   PLT 238 12/06/2011 0550   PLT 241 05/10/2008 0822   MCV 91.6 12/06/2011 0550   MCV 94.4 05/10/2008 0822   NEUTROABS 9.9* 12/05/2011 1620   NEUTROABS 2.7  05/10/2008 0822   LYMPHSABS 0.3* 12/05/2011 1620   LYMPHSABS 1.5 05/10/2008 0822   MONOABS 0.5 12/05/2011 1620   MONOABS 0.6 05/10/2008 0822   EOSABS 0.0 12/05/2011 1620   EOSABS 0.2 05/10/2008 0822   BASOSABS 0.0 12/05/2011 1620   BASOSABS 0.0 05/10/2008 0822      Lab 12/06/11 0550 12/05/11 1620 12/05/11 0858  WBC 12.9* 10.7* 9.2  HGB 11.7* 13.4 13.6  HCT 34.9* 40.0 39.6  PLT 238 236 207  MCV 91.6 92.2 91.5  MCH 30.7 30.9 31.4  MCHC 33.5 33.5 34.3  RDW 14.5 14.3 14.2  LYMPHSABS -- 0.3* 0.5*  MONOABS -- 0.5 0.5  EOSABS -- 0.0 0.0  BASOSABS -- 0.0 0.0  BANDABS -- -- --    Lab 12/06/11 0550 12/05/11 1620 12/05/11 0858  NA 132* 131* 131*  K 3.3* 4.0 4.2  CL 97 92* 94*  CO2 28 30 29   GLUCOSE 106* 115* 110*  BUN 23 22 24*  CREATININE 0.71 0.83 0.86  CALCIUM 8.2* 8.9 9.0  MG -- 2.1 --    Lab 12/05/11 1620  INR 1.10  PROTIME --   Cardiac markers:  Lab 12/06/11 1002 12/05/11 2333 12/05/11 1621  CKMB 2.9 2.7 2.0  TROPONINI <0.30 <0.30 <0.30  MYOGLOBIN -- -- --   No components found with this basename: POCBNP:3 Recent Results (from the past 240 hour(s))  CULTURE, BLOOD (ROUTINE X 2)     Status: Normal (Preliminary result)   Collection Time   12/05/11  9:00 AM      Component Value Range Status Comment   Specimen Description BLOOD LEFT ARM   Final    Special Requests BOTTLES DRAWN AEROBIC AND ANAEROBIC 8CC   Final    Setup Time [id number]   Final    Culture     Final    Value:        BLOOD CULTURE RECEIVED NO GROWTH TO DATE CULTURE WILL BE HELD FOR 5 DAYS BEFORE ISSUING A FINAL NEGATIVE REPORT   Report Status PENDING   Incomplete   CULTURE, BLOOD (ROUTINE X 2)     Status: Normal (Preliminary result)   Collection Time   12/05/11  9:02 AM      Component Value Range Status Comment   Specimen Description BLOOD LEFT ARM   Final    Special Requests BOTTLES DRAWN AEROBIC ONLY 1CC   Final    Setup Time [id number]   Final    Culture     Final    Value:        BLOOD CULTURE  RECEIVED NO GROWTH TO DATE CULTURE WILL BE HELD FOR 5 DAYS BEFORE ISSUING A FINAL NEGATIVE REPORT   Report Status PENDING   Incomplete     Studies/Results: Dg Chest Port 1 View  12/05/2011  *RADIOLOGY REPORT*  Clinical Data: Weakness, cough, fever.  PORTABLE CHEST - 1 VIEW  Comparison: 11/28/2011  Findings: Interval development of bilateral airspace opacities, more pronounced on the right, which is concerning for pneumonia given the clinical presentation.  Biapical scarring and interstitial prominence.  Blunted left costophrenic angle; small pleural effusion. Left hemithorax volume loss, in keeping with a history of left upper lobectomy.  No pneumothorax. Central vascular congestion. Otherwise, stable cardiomediastinal contours.  The patient is status post esophagectomy with gastric pull-through.  No acute osseous abnormality.  IMPRESSION: Multifocal airspace opacities, most pronounced within the periphery of the right upper lung.  Given the clinical presentation, concerning for pneumonia.  Original Report Authenticated By: Waneta Martins, M.D.    Medications: Scheduled Meds:   . azithromycin  500 mg Intravenous Q24H  . cefTRIAXone (ROCEPHIN)  IV  1 g Intravenous Q24H  . enoxaparin  30 mg Subcutaneous Q24H  . levothyroxine  50 mcg Oral Daily  . oseltamivir  75 mg Oral BID  . potassium chloride  40 mEq Oral Once  . DISCONTD: sodium chloride   Intravenous STAT   Continuous Infusions:   . sodium chloride 125 mL/hr at 12/06/11 0707   PRN Meds:.albuterol, ipratropium  Assessment/Plan   Principal Problem:   *WEAKNESS GENERALIZED  - likely secondary to dehydration  - monitor BUN/ Cr  - continue IV fluids NS @ 125 cc/hr  - PT/OT evaluation follow up  Active Problems:   PNEUMONIA  - influenza and community acquired pneumonia  - follow up the final results of blood cultures  - continue azithromycin and ceftriaxone for now  - nebulizer treatment as needed for shortness of breath    - oxygen support with 2 L nasal canula   DEHYDRATION  - provide IV fluids with NS @ 125 cc/hr   EDUCATION  - patient is aware of plan of care and treatment    LOS: 1 day  Alyana Kreiter 12/06/2011, 2:47 PM  [facility] Pager: [phone]

## 2011-12-06 NOTE — Progress Notes (Signed)
Physical Therapy Evaluation Patient Details Name: Glen Green MRN: [id number] DOB: March 16, 1941 Today's Date: 12/06/2011  Problem List:  Patient Active Problem List  Diagnoses  . Dyslipidemia  . History of esophageal cancer  . Hypothyroidism  . Cerebral aneurysm  . History of lung cancer  . Weakness generalized  . Pneumonia due to Hemophilus influenza  . Dehydration  . Protein-calorie malnutrition, moderate  . Hyponatremia    Past Medical History:  Past Medical History  Diagnosis Date  . Dyslipidemia   . History of esophageal cancer     s/p transhiatal esophagogastrectomy  . Hypothyroidism   . Cerebral aneurysm     [place]. repair  1994  . History of lung cancer   . Pneumonia   . Cancer     Esophageal, adrenal gland, skin; lung  . Stroke 1995    denies residual   Past Surgical History:  Past Surgical History  Procedure Date  . Left upper lobectomy with node dissection 02/24/2010    [place]  . Exploratory laparotomy, lysis of adhesions, reduce of incarcerated small bowel and colon from the chest, limited small bowel resection, incidental appendectomy, and then repair of diaphragmatic hernia with alloderm mesh 10/27/2009    [place]  . Left subclavian port- a-cath insertion 03/09/2004    [place]  . Exploratory laparotomy,exploratory thoracotomy for hemorrhage 02/11/2003    [place]  . Transhiatal esophagectomy with cholecystectomy, jejunostomy,  pyloroplasty and removal of right subclavian port-a- cath     [facility]  . Brain surgery     brain aneursyn  . Tonsillectomy   . Cholecystectomy     PT Assessment/Plan/Recommendation PT Assessment Clinical Impression Statement: Pt presents to [facility] with a week long history of weakness. Likely associated to PNA. Will benefit from PT in the acute setting for the following problem list. Likely has no follow up needs from PT but will continue to assess while here. I recommend pt ambulate multiple short distances daily as well as sit OOB in  chair for increased activity tolerance daily. Wife and pt are aware and agreeable.   PT Recommendation/Assessment: Patient will need skilled PT in the acute care venue PT Problem List: Decreased strength;Decreased activity tolerance;Decreased balance;Decreased mobility PT Therapy Diagnosis : Difficulty walking;Abnormality of gait;Generalized weakness PT Plan PT Frequency: Min 3X/week PT Treatment/Interventions: Gait training;Stair training;Functional mobility training;Therapeutic activities;Therapeutic exercise;Balance training;Neuromuscular re-education;Patient/family education PT Recommendation Follow Up Recommendations: I anticipate pt will progress well enough that he will not need any follow up PT however if pt is still unsteady at time of d/c will recommend HHPT. Will continue to assess.  Equipment Recommended: None recommended by PT PT Goals  Acute Rehab PT Goals PT Goal Formulation: With patient Pt will Ambulate: >150 feet;Independently;with least restrictive assistive device PT Goal: Ambulate - Progress: Progressing toward goal Pt will Go Up / Down Stairs: 3-5 stairs;with modified independence;with least restrictive assistive device PT Goal: Up/Down Stairs - Progress: Progressing toward goal Pt will Perform Home Exercise Program: Independently PT Goal: Perform Home Exercise Program - Progress: Progressing toward goal Additional Goals Additional Goal #1: Pt will demonstrate decreased risk of falls with DGI greater than or equal to 20/24.  PT Goal: Additional Goal #1 - Progress: Progressing toward goal  PT Evaluation Precautions/Restrictions  Precautions Precautions: Fall Prior Functioning  Home Living Lives With: Spouse Receives Help From: Family Type of Home: House Home Layout: One level Home Access: Stairs to enter Entrance Stairs-Rails: Right Entrance Stairs-Number of Steps: 3 Bathroom Shower/Tub: Engineer, manufacturing systems: Standard Home  Adaptive Equipment:  None Additional Comments: Wife has helped with ADLS for past three days due to fatigue Prior Function Level of Independence: Independent with basic ADLs;Independent with gait;Independent with transfers Driving: Yes Vocation: Part time employment Vocation Requirements: works at golf course Comments: reports he may be done with working now [employer] Arousal/Alertness: Awake/alert Overall Cognitive Status: Appears within functional limits for tasks assessed Sensation/Coordination Sensation Light Touch: Appears Intact Coordination Gross Motor Movements are Fluid and Coordinated: Yes Fine Motor Movements are Fluid and Coordinated: Yes Extremity Assessment RUE Assessment RUE Assessment:  (generally weak but grossly 4/5) LUE Assessment LUE Assessment:  (same as RUE) RLE Assessment RLE Assessment:  (grossly 4/5 demonstrating generalized weakness) LLE Assessment LLE Assessment:  (generalized weakness grossly 4/5) Mobility (including Balance) Bed Mobility Bed Mobility: Yes Supine to Sit: 6: Modified independent (Device/Increase time) Sit to Supine - Left: 6: Modified independent (Device/Increase time) Transfers Transfers: Yes Sit to Stand: 6: Modified independent (Device/Increase time) Stand to Sit: 6: Modified independent (Device/Increase time) Ambulation/Gait Ambulation/Gait: Yes Ambulation/Gait Assistance: 5: Supervision;4: Min assist Ambulation/Gait Assistance Details (indicate cue type and reason): pt amb with slow shuffled gait and narrow BOS/flexed trunk; amb approx 60 ft before feeling tired and needing to lean against gaurd rail; increased work of breathing during ambulation; attempted to get SaO2 reading but unfortunately unsuccessful; pt then amb back to room; as he became more fatigued pt demonstrated several staggered steps/tripping needing minA to correct Ambulation Distance (Feet): 120 Feet Assistive device: None Gait Pattern: Trunk flexed;Shuffle   Balance Balance Assessed: Yes Dynamic Standing Balance Dynamic Standing - Comments: during ambulation pt with several stumbled/staggered steps; obviously fatigued with gait as legs/ trunk become more flexed  Exercise    End of Session PT - End of Session Equipment Utilized During Treatment: Gait belt Activity Tolerance: Patient tolerated treatment well;Patient limited by fatigue Patient left: in bed;with family/visitor present;with call bell in reach Nurse Communication: Mobility status for transfers;Mobility status for ambulation General Behavior During Session: [facility] for tasks performed Cognition: [facility] for tasks performed  Dr [facility] HELEN 12/06/2011, 2:58 PM

## 2011-12-06 NOTE — Progress Notes (Signed)
OT Discharge Note  Patient is being discharged from OT services secondary to:    Goals met and no further therapy needs identified.  Please see latest Therapy Progress Note for current level of functioning and progress toward goals.  Progress and discharge plan and discussed with patient/caregiver and they    Agree    [place], Chazz Philson Brynn   OTR/L Pager: [phone] Office: [phone] .     

## 2011-12-06 NOTE — Progress Notes (Signed)
Occupational Therapy Evaluation Patient Details Name: Glen Green MRN: [id number] DOB: 1941-08-11 Today's Date: 12/06/2011  Problem List:  Patient Active Problem List  Diagnoses  . Dyslipidemia  . History of esophageal cancer  . Hypothyroidism  . Cerebral aneurysm  . History of lung cancer  . Weakness generalized  . Pneumonia due to Hemophilus influenza  . Dehydration  . Protein-calorie malnutrition, moderate  . Hyponatremia    Past Medical History:  Past Medical History  Diagnosis Date  . Dyslipidemia   . History of esophageal cancer     s/p transhiatal esophagogastrectomy  . Hypothyroidism   . Cerebral aneurysm     [place]. repair  1994  . History of lung cancer   . Pneumonia   . Cancer     Esophageal, adrenal gland, skin; lung  . Stroke 1995    denies residual   Past Surgical History:  Past Surgical History  Procedure Date  . Left upper lobectomy with node dissection 02/24/2010    [place]  . Exploratory laparotomy, lysis of adhesions, reduce of incarcerated small bowel and colon from the chest, limited small bowel resection, incidental appendectomy, and then repair of diaphragmatic hernia with alloderm mesh 10/27/2009    [place]  . Left subclavian port- a-cath insertion 03/09/2004    [place]  . Exploratory laparotomy,exploratory thoracotomy for hemorrhage 02/11/2003    [place]  . Transhiatal esophagectomy with cholecystectomy, jejunostomy,  pyloroplasty and removal of right subclavian port-a- cath     [facility]  . Brain surgery     brain aneursyn  . Tonsillectomy   . Cholecystectomy     OT Assessment/Plan/Recommendation OT Assessment Clinical Impression Statement: 70 yo male presenting with flu symptoms that could benefit from home health OT for activity tolerance. No acute OT needs at this time. Recommend pt walk with staff or family and sit up for all meals. Pt maintained O2 saturations above 88 % on RA. OT Recommendation/Assessment: All further OT needs can be  met in the next venue of care OT Recommendation Follow Up Recommendations: Home health OT Equipment Recommended: None recommended by PT OT Goals    OT Evaluation Precautions/Restrictions  Precautions Precautions: Fall Prior Functioning Home Living Lives With: Spouse Receives Help From: Family Type of Home: House Home Layout: One level Home Access: Stairs to enter Entrance Stairs-Rails: Right Entrance Stairs-Number of Steps: 3 Bathroom Shower/Tub: Engineer, manufacturing systems: Standard Home Adaptive Equipment: None Additional Comments: Wife has helped with ADLS for past three days due to fatigue Prior Function Level of Independence: Independent with basic ADLs;Independent with gait;Independent with transfers Driving: Yes Vocation: Part time employment Vocation Requirements: works at golf course Comments: reports he may be done with working now ADL ADL Grooming: Performed;Wash/dry hands;Modified independent Where Assessed - Grooming: Standing at sink Vision/Perception    Cognition Cognition Arousal/Alertness: Awake/alert Overall Cognitive Status: Appears within functional limits for tasks assessed Sensation/Coordination Sensation Light Touch: Appears Intact Coordination Gross Motor Movements are Fluid and Coordinated: Yes Fine Motor Movements are Fluid and Coordinated: Yes Extremity Assessment RUE Assessment RUE Assessment:  (generally weak but grossly 4/5) LUE Assessment LUE Assessment:  (same as RUE) Mobility  Bed Mobility Bed Mobility: Yes Supine to Sit: 6: Modified independent (Device/Increase time) Sit to Supine - Left: 6: Modified independent (Device/Increase time) Transfers Sit to Stand: 6: Modified independent (Device/Increase time) Stand to Sit: 6: Modified independent (Device/Increase time) Exercises   End of Session OT - End of Session Equipment Utilized During Treatment: Gait belt Activity Tolerance: Patient limited by  fatigue Patient  left: in bed;with call bell in reach;with family/visitor present (wife) Nurse Communication: Mobility status for ambulation;Mobility status for transfers General Behavior During Session: [facility] for tasks performed Cognition: [facility] for tasks performed   Lucile Shutters 12/06/2011, 2:58 PM  Pager: [phone]

## 2011-12-07 LAB — BASIC METABOLIC PANEL
CO2: 26 mEq/L (ref 19–32)
Calcium: 7.8 mg/dL — ABNORMAL LOW (ref 8.4–10.5)
Chloride: 98 mEq/L (ref 96–112)
Creatinine, Ser: 0.64 mg/dL (ref 0.50–1.35)
Glucose, Bld: 94 mg/dL (ref 70–99)

## 2011-12-07 LAB — CBC
Hemoglobin: 11.1 g/dL — ABNORMAL LOW (ref 13.0–17.0)
MCH: 30.6 pg (ref 26.0–34.0)
MCV: 91.5 fL (ref 78.0–100.0)
RBC: 3.63 MIL/uL — ABNORMAL LOW (ref 4.22–5.81)
WBC: 6.7 10*3/uL (ref 4.0–10.5)

## 2011-12-07 LAB — GLUCOSE, CAPILLARY

## 2011-12-07 MED ORDER — POTASSIUM CHLORIDE CRYS ER 20 MEQ PO TBCR
40.0000 meq | EXTENDED_RELEASE_TABLET | Freq: Once | ORAL | Status: AC
  Administered 2011-12-07: 40 meq via ORAL
  Filled 2011-12-07: qty 2

## 2011-12-07 NOTE — Clinical Documentation Improvement (Signed)
BMI DOCUMENTATION CLARIFICATION QUERY  THIS DOCUMENT IS NOT A PERMANENT PART OF THE MEDICAL RECORD  TO RESPOND TO THE THIS QUERY, FOLLOW THE INSTRUCTIONS BELOW:  1. If needed, update documentation for the patient's encounter via the notes activity.  2. Access this query again and click edit on the In [employer].  3. After updating, or not, click F2 to complete all highlighted (required) fields concerning your review. Select "additional documentation in the medical record" OR "no additional documentation provided".  4. Click Sign note button.  5. The deficiency will fall out of your In Basket *Please let us know if you are not able to complete this workflow by phone or e-mail (listed below).         12/07/11  Dear Dr. Elisabeth Pigeon Marton Redwood  In an effort to better capture your patient's severity of illness, reflect appropriate length of stay and utilization of resources, a review of the patient medical record has revealed the following indicators.   Based on your clinical judgment, please clarify and document in a progress note and/or discharge summary the clinical condition associated with the following supporting information: In responding to this query please exercise your independent judgment.  The fact that a query is asked, does not imply that any particular answer is desired or expected.   According to the documented Height and Weight in CHL/EPIC, the patients BMI is less than 19. If your clinical findings/judgment agrees with this, please document this along with the related diagnosis in the progress note and discharge summary. THANK YOU!    BEST PRACTICE: A diagnosis of UNDERWEIGHT or MORBID OBESITY should have the BMI documented along with it.   Possible Clinical Conditions?  - underweight  - Other condition (please document in the progress notes and/or discharge summary)  - Cannot Clinically determine at this time    Supporting Information:  - Doc Flowsheet  Information: Height=5'10" Weight=128lbs BMI=18.4   No additional documentation in chart upon review. SM    Thank You,  Eldred Manges  Clinical Documentation Specialist Health Information Management [place] Email: [email or web address]

## 2011-12-07 NOTE — Progress Notes (Signed)
Patient ID: Glen Green, male   DOB: 02-Jan-1941, 70 y.o.   MRN: [id number] Subjective: No events overnight. Patient denies chest pain, shortness of breath, abdominal pain.   Objective:  Vital signs in last 24 hours:  Filed Vitals:   12/06/11 2205 12/07/11 0500 12/07/11 1159 12/07/11 1346  BP:  101/61  99/69  Pulse:  79 78 73  Temp:  97.7 F (36.5 C)  97.7 F (36.5 C)  TempSrc:  Oral  Oral  Resp:  20  18  Height:      Weight:  60.737 kg (133 lb 14.4 oz)    SpO2: 97% 91% 93% 94%    Intake/Output from previous day:   Intake/Output Summary (Last 24 hours) at 12/07/11 1504 Last data filed at 12/07/11 0900  Gross per 24 hour  Intake   1740 ml  Output      0 ml  Net   1740 ml    Physical Exam: General: Alert, awake, oriented x3, in no acute distress. HEENT: No bruits, no goiter. Moist mucous membranes, no scleral icterus, no conjunctival pallor. Heart: Regular rate and rhythm, S1/S2 +, no murmurs, rubs, gallops. Lungs: Clear to auscultation bilaterally. No wheezing, no rhonchi, no rales.  Abdomen: Soft, nontender, nondistended, positive bowel sounds. Extremities: No clubbing or cyanosis, no pitting edema,  positive pedal pulses. Neuro: Grossly nonfocal.  Lab Results:  Basic Metabolic Panel:    Component Value Date/Time   NA 131* 12/07/2011 0540   K 3.2* 12/07/2011 0540   CL 98 12/07/2011 0540   CO2 26 12/07/2011 0540   BUN 19 12/07/2011 0540   CREATININE 0.64 12/07/2011 0540   CREATININE 1.03 11/26/2011 0810   GLUCOSE 94 12/07/2011 0540   CALCIUM 7.8* 12/07/2011 0540   CBC:    Component Value Date/Time   WBC 6.7 12/07/2011 0540   WBC 5.0 05/10/2008 0822   HGB 11.1* 12/07/2011 0540   HGB 14.5 05/10/2008 0822   HCT 33.2* 12/07/2011 0540   HCT 42.8 05/10/2008 0822   PLT 258 12/07/2011 0540   PLT 241 05/10/2008 0822   MCV 91.5 12/07/2011 0540   MCV 94.4 05/10/2008 0822   NEUTROABS 9.9* 12/05/2011 1620   NEUTROABS 2.7 05/10/2008 0822   LYMPHSABS 0.3* 12/05/2011 1620   LYMPHSABS 1.5 05/10/2008 0822   MONOABS 0.5 12/05/2011 1620   MONOABS 0.6 05/10/2008 0822   EOSABS 0.0 12/05/2011 1620   EOSABS 0.2 05/10/2008 0822   BASOSABS 0.0 12/05/2011 1620   BASOSABS 0.0 05/10/2008 0822      Lab 12/07/11 0540 12/06/11 0550 12/05/11 1620 12/05/11 0858  WBC 6.7 12.9* 10.7* 9.2  HGB 11.1* 11.7* 13.4 13.6  HCT 33.2* 34.9* 40.0 39.6  PLT 258 238 236 207  MCV 91.5 91.6 92.2 91.5  MCH 30.6 30.7 30.9 31.4  MCHC 33.4 33.5 33.5 34.3  RDW 14.4 14.5 14.3 14.2  LYMPHSABS -- -- 0.3* 0.5*  MONOABS -- -- 0.5 0.5  EOSABS -- -- 0.0 0.0  BASOSABS -- -- 0.0 0.0  BANDABS -- -- -- --    Lab 12/07/11 0540 12/06/11 0550 12/05/11 1620 12/05/11 0858  NA 131* 132* 131* 131*  K 3.2* 3.3* 4.0 4.2  CL 98 97 92* 94*  CO2 26 28 30 29   GLUCOSE 94 106* 115* 110*  BUN 19 23 22  24*  CREATININE 0.64 0.71 0.83 0.86  CALCIUM 7.8* 8.2* 8.9 9.0  MG -- -- 2.1 --    Lab 12/05/11 1620  INR 1.10  PROTIME --  Cardiac markers:  Lab 12/06/11 1002 12/05/11 2333 12/05/11 1621  CKMB 2.9 2.7 2.0  TROPONINI <0.30 <0.30 <0.30  MYOGLOBIN -- -- --   No components found with this basename: POCBNP:3 Recent Results (from the past 240 hour(s))  CULTURE, BLOOD (ROUTINE X 2)     Status: Normal (Preliminary result)   Collection Time   12/05/11  9:00 AM      Component Value Range Status Comment   Specimen Description BLOOD LEFT ARM   Final    Special Requests BOTTLES DRAWN AEROBIC AND ANAEROBIC 8CC   Final    Setup Time [id number]   Final    Culture     Final    Value:        BLOOD CULTURE RECEIVED NO GROWTH TO DATE CULTURE WILL BE HELD FOR 5 DAYS BEFORE ISSUING A FINAL NEGATIVE REPORT   Report Status PENDING   Incomplete   CULTURE, BLOOD (ROUTINE X 2)     Status: Normal (Preliminary result)   Collection Time   12/05/11  9:02 AM      Component Value Range Status Comment   Specimen Description BLOOD LEFT ARM   Final    Special Requests BOTTLES DRAWN AEROBIC ONLY 1CC   Final    Setup Time  [id number]   Final    Culture     Final    Value:        BLOOD CULTURE RECEIVED NO GROWTH TO DATE CULTURE WILL BE HELD FOR 5 DAYS BEFORE ISSUING A FINAL NEGATIVE REPORT   Report Status PENDING   Incomplete   URINE CULTURE     Status: Normal   Collection Time   12/05/11  2:46 PM      Component Value Range Status Comment   Specimen Description URINE, CLEAN CATCH   Final    Special Requests NONE   Final    Setup Time [id number]   Final    Colony Count NO GROWTH   Final    Culture NO GROWTH   Final    Report Status 12/06/2011 FINAL   Final     Studies/Results: No results found.  Medications: Scheduled Meds:   . azithromycin  500 mg Intravenous Q24H  . cefTRIAXone (ROCEPHIN)  IV  1 g Intravenous Q24H  . enoxaparin  30 mg Subcutaneous Q24H  . levothyroxine  50 mcg Oral Daily  . oseltamivir  75 mg Oral BID  . potassium chloride  40 mEq Oral Once  . potassium chloride  40 mEq Oral Once   Continuous Infusions:   . sodium chloride 125 mL/hr at 12/07/11 0012   PRN Meds:.albuterol, ipratropium  Assessment/Plan   Principal Problem:   *WEAKNESS GENERALIZED  - likely secondary to dehydration  - monitor BUN/ Cr  - continue IV fluids NS @ 125 cc/hr  - PT/OT evaluation follow up   Active Problems:   PNEUMONIA  - influenza and community acquired pneumonia  - follow up the final results of blood cultures  - continue azithromycin and ceftriaxone for now ; continue tamiflu - nebulizer treatment as needed for shortness of breath  - oxygen support with 2 L nasal canula   DEHYDRATION  - provide IV fluids with NS @ 125 cc/hr   EDUCATION  - patient is aware of plan of care and treatment   LOS: 2 days   Alcides Nutting 12/07/2011, 3:04 PM  [facility] Pager: [phone]

## 2011-12-07 NOTE — Progress Notes (Signed)
Patient ambulated on 2L O2 per [place] in hallway x 100 ft.  Gait instability noted without complaint of vertigo or dizziness.  Required rest period in chair in the hallway.  O2 sat 92% upon sitting.  At 4 minutes, O2 sat increased to 97% on 2L O2 per [place]. Rested additional 1 minute, then ambulated the remaining 100 ft back to patient's room.  Resting O2 sat remained 95% - 97% on 2L O2 per [place].  Continuing to monitor.

## 2011-12-07 NOTE — Plan of Care (Signed)
Problem: Phase II Progression Outcomes Goal: Other Phase II Outcomes/Goals Outcome: Progressing Progressing mobility

## 2011-12-07 NOTE — Clinical Documentation Improvement (Signed)
Abnormal Labs Clarification  THIS DOCUMENT IS NOT A PERMANENT PART OF THE MEDICAL RECORD  TO RESPOND TO THE THIS QUERY, FOLLOW THE INSTRUCTIONS BELOW:  1. If needed, update documentation for the patient's encounter via the notes activity.  2. Access this query again and click edit on the [employer].  3. After updating, or not, click F2 to complete all highlighted (required) fields concerning your review. Select "additional documentation in the medical record" OR "no additional documentation provided".  4. Click Sign note button.  5. The deficiency will fall out of your InBasket *Please let us know if you are not able to complete this workflow by phone or e-mail (listed below).  Please update your documentation within the medical record to reflect your response to this query.                                                                                   12/07/11  Dear Dr. Elisabeth Pigeon Marton Redwood  In a better effort to capture your patient's severity of illness, reflect appropriate length of stay and utilization of resources, a review of the medical record has revealed the following indicators.   Based on your clinical judgment, please clarify and document in a progress note and/or discharge summary the clinical condition associated with the following supporting information: In responding to this query please exercise your independent judgment.  The fact that a query is asked, does not imply that any particular answer is desired or expected.  Abnormal findings (laboratory, x-ray, pathologic, and other diagnostic results) are not coded and reported unless the physician indicates their clinical significance.   The medical record reflects the following clinical findings, please clarify the diagnostic and/or clinical significance:       Possible Clinical Conditions?  - hypokalemia and/or hyponatremia   - Other condition (please document in the progress notes and/or discharge  summary)  - Cannot Clinically determine at this time   Supporting Information:  - potassium chloride SA (K-DUR,KLOR-CON) CR tablet 40 mEq ordered 12/28  -  Component Sodium Potassium  Latest Ref Rng 135 - 145 mEq/L 3.5 - 5.1 mEq/L  12/05/2011 131 (L) 4.2  12/05/2011 131 (L) 4.0  12/06/2011 132 (L) 3.3 (L)  12/07/2011 131 (L) 3.2 (L)     Additional documentation in chart upon review. SM    Thank You,  Eldred Manges  Clinical Documentation Specialist Health Information Management [place] Email: [email or web address]

## 2011-12-07 NOTE — Progress Notes (Signed)
Physical Therapy Treatment Patient Details Name: Glen Green MRN: [id number] DOB: 08-09-1941 Today's Date: 12/07/2011  PT Assessment/Plan  PT - Assessment/Plan Comments on Treatment Session: Pt. drops O2 sats on room air while ambulating.  Remains deconditioned and with overall decreased activity tolerance.   PT Frequency: Min 3X/week Follow Up Recommendations: None (pending further progress) Equipment Recommended: None recommended by PT PT Goals  Acute Rehab PT Goals PT Goal: Ambulate - Progress: Progressing toward goal  PT Treatment Precautions/Restrictions  Precautions Precautions: Fall Precaution Comments: drops sats on room air while ambulating Mobility (including Balance) Bed Mobility Bed Mobility: Yes Supine to Sit: 6: Modified independent (Device/Increase time) Sit to Supine - Left: Not tested (comment) Transfers Sit to Stand: 6: Modified independent (Device/Increase time) Stand to Sit: 6: Modified independent (Device/Increase time) Ambulation/Gait Ambulation/Gait: Yes Ambulation/Gait Assistance: 4: Min assist Ambulation/Gait Assistance Details (indicate cue type and reason): pt. continues with slow ambulation and shuffling steps.  Neweded to sit for rest twice during amb. Noted increased WOB during gait; O2 applied (2L/min.) after first check of O2 sats at midway point of walk.  Pt. able to maintain sats during remainder of walk, but did need one more seated rest.. Ambulation Distance (Feet): 130 Feet Assistive device: None (pt. managed his IV pole.) Gait Pattern: Trunk flexed;Shuffle    Exercise    End of Session PT - End of Session Equipment Utilized During Treatment: Gait belt Activity Tolerance: Patient limited by fatigue (pt. motivated to do all he can tolerate) Patient left: in bed;with call bell in reach Nurse Communication: Mobility status for transfers;Mobility status for ambulation General Behavior During Session: [facility] for tasks performed Cognition:  [facility] for tasks performed  Ferman Hamming 12/07/2011, 12:32 PM Acute Rehabilitation Services [phone] (pager)

## 2011-12-08 LAB — CBC
HCT: 33.5 % — ABNORMAL LOW (ref 39.0–52.0)
Hemoglobin: 11.4 g/dL — ABNORMAL LOW (ref 13.0–17.0)
MCH: 31.5 pg (ref 26.0–34.0)
MCHC: 34 g/dL (ref 30.0–36.0)
RBC: 3.62 MIL/uL — ABNORMAL LOW (ref 4.22–5.81)

## 2011-12-08 LAB — BASIC METABOLIC PANEL
BUN: 13 mg/dL (ref 6–23)
CO2: 27 mEq/L (ref 19–32)
GFR calc non Af Amer: >90 mL/min (ref >90)
Glucose, Bld: 100 mg/dL — ABNORMAL HIGH (ref 70–99)
Potassium: 3.1 mEq/L — ABNORMAL LOW (ref 3.5–5.1)
Sodium: 135 mEq/L (ref 135–145)

## 2011-12-08 LAB — GLUCOSE, CAPILLARY

## 2011-12-08 MED ORDER — POTASSIUM CHLORIDE CRYS ER 20 MEQ PO TBCR
40.0000 meq | EXTENDED_RELEASE_TABLET | Freq: Once | ORAL | Status: AC
  Administered 2011-12-08: 40 meq via ORAL
  Filled 2011-12-08 (×2): qty 1

## 2011-12-08 MED ORDER — GUAIFENESIN ER 600 MG PO TB12
600.0000 mg | ORAL_TABLET | Freq: Two times a day (BID) | ORAL | Status: DC
  Administered 2011-12-08 – 2011-12-10 (×4): 600 mg via ORAL
  Filled 2011-12-08 (×5): qty 1

## 2011-12-08 NOTE — Progress Notes (Signed)
Patient ID: Glen Green, male   DOB: 1941-08-06, 70 y.o.   MRN: [id number] Subjective: No events overnight. Patient denies chest pain, shortness of breath, abdominal pain.   Objective:  Vital signs in last 24 hours:  Filed Vitals:   12/07/11 2100 12/08/11 0500 12/08/11 0524 12/08/11 0932  BP: 120/76  122/67 93/58  Pulse: 72  68 69  Temp: 97.8 F (36.6 C)  97.7 F (36.5 C) 97.4 F (36.3 C)  TempSrc: Oral  Oral Oral  Resp: 20  16   Height:      Weight:  61.735 kg (136 lb 1.6 oz) 61.735 kg (136 lb 1.6 oz)   SpO2: 92%  91% 96%    Intake/Output from previous day:   Intake/Output Summary (Last 24 hours) at 12/08/11 1327 Last data filed at 12/07/11 1700  Gross per 24 hour  Intake    240 ml  Output      0 ml  Net    240 ml    Physical Exam: General: Alert, awake, oriented x3, in no acute distress. HEENT: No bruits, no goiter. Moist mucous membranes, no scleral icterus, no conjunctival pallor. Heart: Regular rate and rhythm, S1/S2 +, no murmurs, rubs, gallops. Lungs: Clear to auscultation bilaterally. No wheezing, no rhonchi, no rales.  Abdomen: Soft, nontender, nondistended, positive bowel sounds. Extremities: No clubbing or cyanosis, no pitting edema,  positive pedal pulses. Neuro: Grossly nonfocal.  Lab Results:  Basic Metabolic Panel:    Component Value Date/Time   NA 135 12/08/2011 0700   K 3.1* 12/08/2011 0700   CL 100 12/08/2011 0700   CO2 27 12/08/2011 0700   BUN 13 12/08/2011 0700   CREATININE 0.67 12/08/2011 0700   CREATININE 1.03 11/26/2011 0810   GLUCOSE 100* 12/08/2011 0700   CALCIUM 7.9* 12/08/2011 0700   CBC:    Component Value Date/Time   WBC 5.9 12/08/2011 0700   WBC 5.0 05/10/2008 0822   HGB 11.4* 12/08/2011 0700   HGB 14.5 05/10/2008 0822   HCT 33.5* 12/08/2011 0700   HCT 42.8 05/10/2008 0822   PLT 286 12/08/2011 0700   PLT 241 05/10/2008 0822   MCV 92.5 12/08/2011 0700   MCV 94.4 05/10/2008 0822   NEUTROABS 9.9* 12/05/2011 1620   NEUTROABS 2.7  05/10/2008 0822   LYMPHSABS 0.3* 12/05/2011 1620   LYMPHSABS 1.5 05/10/2008 0822   MONOABS 0.5 12/05/2011 1620   MONOABS 0.6 05/10/2008 0822   EOSABS 0.0 12/05/2011 1620   EOSABS 0.2 05/10/2008 0822   BASOSABS 0.0 12/05/2011 1620   BASOSABS 0.0 05/10/2008 0822      Lab 12/08/11 0700 12/07/11 0540 12/06/11 0550 12/05/11 1620 12/05/11 0858  WBC 5.9 6.7 12.9* 10.7* 9.2  HGB 11.4* 11.1* 11.7* 13.4 13.6  HCT 33.5* 33.2* 34.9* 40.0 39.6  PLT 286 258 238 236 207  MCV 92.5 91.5 91.6 92.2 91.5  MCH 31.5 30.6 30.7 30.9 31.4  MCHC 34.0 33.4 33.5 33.5 34.3  RDW 14.5 14.4 14.5 14.3 14.2  LYMPHSABS -- -- -- 0.3* 0.5*  MONOABS -- -- -- 0.5 0.5  EOSABS -- -- -- 0.0 0.0  BASOSABS -- -- -- 0.0 0.0  BANDABS -- -- -- -- --    Lab 12/08/11 0700 12/07/11 0540 12/06/11 0550 12/05/11 1620 12/05/11 0858  NA 135 131* 132* 131* 131*  K 3.1* 3.2* 3.3* 4.0 4.2  CL 100 98 97 92* 94*  CO2 27 26 28 30 29   GLUCOSE 100* 94 106* 115* 110*  BUN 13 19  23 22 24*  CREATININE 0.67 0.64 0.71 0.83 0.86  CALCIUM 7.9* 7.8* 8.2* 8.9 9.0  MG -- -- -- 2.1 --    Lab 12/05/11 1620  INR 1.10  PROTIME --   Cardiac markers:  Lab 12/06/11 1002 12/05/11 2333 12/05/11 1621  CKMB 2.9 2.7 2.0  TROPONINI <0.30 <0.30 <0.30  MYOGLOBIN -- -- --   No components found with this basename: POCBNP:3 Recent Results (from the past 240 hour(s))  CULTURE, BLOOD (ROUTINE X 2)     Status: Normal (Preliminary result)   Collection Time   12/05/11  9:00 AM      Component Value Range Status Comment   Specimen Description BLOOD LEFT ARM   Final    Special Requests BOTTLES DRAWN AEROBIC AND ANAEROBIC 8CC   Final    Setup Time [id number]   Final    Culture     Final    Value:        BLOOD CULTURE RECEIVED NO GROWTH TO DATE CULTURE WILL BE HELD FOR 5 DAYS BEFORE ISSUING A FINAL NEGATIVE REPORT   Report Status PENDING   Incomplete   CULTURE, BLOOD (ROUTINE X 2)     Status: Normal (Preliminary result)   Collection Time   12/05/11  9:02 AM        Component Value Range Status Comment   Specimen Description BLOOD LEFT ARM   Final    Special Requests BOTTLES DRAWN AEROBIC ONLY 1CC   Final    Setup Time [id number]   Final    Culture     Final    Value:        BLOOD CULTURE RECEIVED NO GROWTH TO DATE CULTURE WILL BE HELD FOR 5 DAYS BEFORE ISSUING A FINAL NEGATIVE REPORT   Report Status PENDING   Incomplete   URINE CULTURE     Status: Normal   Collection Time   12/05/11  2:46 PM      Component Value Range Status Comment   Specimen Description URINE, CLEAN CATCH   Final    Special Requests NONE   Final    Setup Time [id number]   Final    Colony Count NO GROWTH   Final    Culture NO GROWTH   Final    Report Status 12/06/2011 FINAL   Final     Studies/Results: No results found.  Medications: Scheduled Meds:   . azithromycin  500 mg Intravenous Q24H  . cefTRIAXone (ROCEPHIN)  IV  1 g Intravenous Q24H  . enoxaparin  30 mg Subcutaneous Q24H  . levothyroxine  50 mcg Oral Daily  . oseltamivir  75 mg Oral BID  . potassium chloride  40 mEq Oral Once  . potassium chloride  40 mEq Oral Once   Continuous Infusions:   . sodium chloride 125 mL/hr at 12/08/11 0850   PRN Meds:.albuterol, ipratropium  Assessment/Plan   Principal Problem:   *WEAKNESS GENERALIZED  - likely secondary to dehydration  - monitor BUN/ Cr  - continue IV fluids NS @ 125 cc/hr  - PT/OT evaluation follow up   Active Problems:   PNEUMONIA  - influenza and community acquired pneumonia  - Blood cultures to date show no growth - continue azithromycin and ceftriaxone for now ; continue tamiflu  - nebulizer treatment as needed for shortness of breath  - oxygen support with 2 L nasal canula   DEHYDRATION  - provide IV fluids with NS @ 125 cc/hr   EDUCATION  - patient is aware  of plan of care and treatment    LOS: 3 days   Eiza Canniff 12/08/2011, 1:27 PM  [facility] Pager: [phone]

## 2011-12-09 DIAGNOSIS — E876 Hypokalemia: Secondary | ICD-10-CM | POA: Diagnosis present

## 2011-12-09 LAB — BASIC METABOLIC PANEL
BUN: 12 mg/dL (ref 6–23)
CO2: 28 mEq/L (ref 19–32)
Chloride: 103 mEq/L (ref 96–112)
Creatinine, Ser: 0.58 mg/dL (ref 0.50–1.35)
GFR calc Af Amer: >90 mL/min (ref >90)
Glucose, Bld: 107 mg/dL — ABNORMAL HIGH (ref 70–99)
Potassium: 3 mEq/L — ABNORMAL LOW (ref 3.5–5.1)

## 2011-12-09 LAB — CBC
HCT: 32.6 % — ABNORMAL LOW (ref 39.0–52.0)
Hemoglobin: 11.4 g/dL — ABNORMAL LOW (ref 13.0–17.0)
MCV: 93.1 fL (ref 78.0–100.0)
RDW: 14.5 % (ref 11.5–15.5)
WBC: 6.1 10*3/uL (ref 4.0–10.5)

## 2011-12-09 MED ORDER — POTASSIUM CHLORIDE CRYS ER 20 MEQ PO TBCR
40.0000 meq | EXTENDED_RELEASE_TABLET | Freq: Once | ORAL | Status: AC
  Administered 2011-12-09: 40 meq via ORAL
  Filled 2011-12-09: qty 2

## 2011-12-09 NOTE — Progress Notes (Signed)
Patient ID: Glen Green, male   DOB: Aug 20, 1941, 70 y.o.   MRN: [id number] Subjective: No events overnight. Patient denies chest pain, shortness of breath, abdominal pain. Had bowel movement and reports ambulating.  Objective:  Vital signs in last 24 hours:  Filed Vitals:   12/08/11 1400 12/08/11 2100 12/09/11 0500 12/09/11 1400  BP: 115/71 121/73 112/68 141/84  Pulse: 66 74 72 77  Temp: 97.6 F (36.4 C) 97.7 F (36.5 C) 97.8 F (36.6 C) 98 F (36.7 C)  TempSrc: Oral Oral Oral Oral  Resp: 16 18 18 18   Height:      Weight:   61.7 kg (136 lb 0.4 oz)   SpO2: 95% 95% 94% 95%    Intake/Output from previous day:   Intake/Output Summary (Last 24 hours) at 12/09/11 1444 Last data filed at 12/09/11 0824  Gross per 24 hour  Intake    100 ml  Output      0 ml  Net    100 ml    Physical Exam: General: Alert, awake, oriented x3, in no acute distress. HEENT: No bruits, no goiter. Moist mucous membranes, no scleral icterus, no conjunctival pallor. Heart: Regular rate and rhythm, S1/S2 +, no murmurs, rubs, gallops. Lungs: Clear to auscultation bilaterally. No wheezing, no rhonchi, no rales.  Abdomen: Soft, nontender, nondistended, positive bowel sounds. Extremities: No clubbing or cyanosis, no pitting edema,  positive pedal pulses. Neuro: Grossly nonfocal.  Lab Results:  Basic Metabolic Panel:    Component Value Date/Time   NA 136 12/09/2011 0508   K 3.0* 12/09/2011 0508   CL 103 12/09/2011 0508   CO2 28 12/09/2011 0508   BUN 12 12/09/2011 0508   CREATININE 0.58 12/09/2011 0508   CREATININE 1.03 11/26/2011 0810   GLUCOSE 107* 12/09/2011 0508   CALCIUM 8.1* 12/09/2011 0508   CBC:    Component Value Date/Time   WBC 6.1 12/09/2011 0508   WBC 5.0 05/10/2008 0822   HGB 11.4* 12/09/2011 0508   HGB 14.5 05/10/2008 0822   HCT 32.6* 12/09/2011 0508   HCT 42.8 05/10/2008 0822   PLT 328 12/09/2011 0508   PLT 241 05/10/2008 0822   MCV 93.1 12/09/2011 0508   MCV 94.4 05/10/2008 0822     NEUTROABS 9.9* 12/05/2011 1620   NEUTROABS 2.7 05/10/2008 0822   LYMPHSABS 0.3* 12/05/2011 1620   LYMPHSABS 1.5 05/10/2008 0822   MONOABS 0.5 12/05/2011 1620   MONOABS 0.6 05/10/2008 0822   EOSABS 0.0 12/05/2011 1620   EOSABS 0.2 05/10/2008 0822   BASOSABS 0.0 12/05/2011 1620   BASOSABS 0.0 05/10/2008 0822      Lab 12/09/11 0508 12/08/11 0700 12/07/11 0540 12/06/11 0550 12/05/11 1620 12/05/11 0858  WBC 6.1 5.9 6.7 12.9* 10.7* --  HGB 11.4* 11.4* 11.1* 11.7* 13.4 --  HCT 32.6* 33.5* 33.2* 34.9* 40.0 --  PLT 328 286 258 238 236 --  MCV 93.1 92.5 91.5 91.6 92.2 --  MCH 32.6 31.5 30.6 30.7 30.9 --  MCHC 35.0 34.0 33.4 33.5 33.5 --  RDW 14.5 14.5 14.4 14.5 14.3 --  LYMPHSABS -- -- -- -- 0.3* 0.5*  MONOABS -- -- -- -- 0.5 0.5  EOSABS -- -- -- -- 0.0 0.0  BASOSABS -- -- -- -- 0.0 0.0  BANDABS -- -- -- -- -- --    Lab 12/09/11 0508 12/08/11 0700 12/07/11 0540 12/06/11 0550 12/05/11 1620  NA 136 135 131* 132* 131*  K 3.0* 3.1* 3.2* 3.3* 4.0  CL 103 100 98 97  92*  CO2 28 27 26 28 30   GLUCOSE 107* 100* 94 106* 115*  BUN 12 13 19 23 22   CREATININE 0.58 0.67 0.64 0.71 0.83  CALCIUM 8.1* 7.9* 7.8* 8.2* 8.9  MG -- -- -- -- 2.1    Lab 12/05/11 1620  INR 1.10  PROTIME --   Cardiac markers:  Lab 12/06/11 1002 12/05/11 2333 12/05/11 1621  CKMB 2.9 2.7 2.0  TROPONINI <0.30 <0.30 <0.30  MYOGLOBIN -- -- --   No components found with this basename: POCBNP:3 Recent Results (from the past 240 hour(s))  CULTURE, BLOOD (ROUTINE X 2)     Status: Normal (Preliminary result)   Collection Time   12/05/11  9:00 AM      Component Value Range Status Comment   Specimen Description BLOOD LEFT ARM   Final    Special Requests BOTTLES DRAWN AEROBIC AND ANAEROBIC 8CC   Final    Setup Time [id number]   Final    Culture     Final    Value:        BLOOD CULTURE RECEIVED NO GROWTH TO DATE CULTURE WILL BE HELD FOR 5 DAYS BEFORE ISSUING A FINAL NEGATIVE REPORT   Report Status PENDING   Incomplete    CULTURE, BLOOD (ROUTINE X 2)     Status: Normal (Preliminary result)   Collection Time   12/05/11  9:02 AM      Component Value Range Status Comment   Specimen Description BLOOD LEFT ARM   Final    Special Requests BOTTLES DRAWN AEROBIC ONLY 1CC   Final    Setup Time [id number]   Final    Culture     Final    Value:        BLOOD CULTURE RECEIVED NO GROWTH TO DATE CULTURE WILL BE HELD FOR 5 DAYS BEFORE ISSUING A FINAL NEGATIVE REPORT   Report Status PENDING   Incomplete   URINE CULTURE     Status: Normal   Collection Time   12/05/11  2:46 PM      Component Value Range Status Comment   Specimen Description URINE, CLEAN CATCH   Final    Special Requests NONE   Final    Setup Time [id number]   Final    Colony Count NO GROWTH   Final    Culture NO GROWTH   Final    Report Status 12/06/2011 FINAL   Final     Studies/Results: No results found.  Medications: Scheduled Meds:   . azithromycin  500 mg Intravenous Q24H  . cefTRIAXone (ROCEPHIN)  IV  1 g Intravenous Q24H  . enoxaparin  30 mg Subcutaneous Q24H  . guaiFENesin  600 mg Oral BID  . levothyroxine  50 mcg Oral Daily  . oseltamivir  75 mg Oral BID  . potassium chloride  40 mEq Oral Once  . potassium chloride  40 mEq Oral Once   Continuous Infusions:   . sodium chloride 125 mL/hr at 12/08/11 0850   PRN Meds:.albuterol, ipratropium  Assessment/Plan:  Principal Problem:   *WEAKNESS GENERALIZED  - likely secondary to dehydration  - monitor BUN/ Cr  - continue IV fluids NS @ 125 cc/hr  - PT/OT evaluation follow up   Active Problems:   PNEUMONIA  - influenza and community acquired pneumonia  - Blood cultures to date show no growth  - continue azithromycin and ceftriaxone for now - continue tamiflu  - nebulizer treatment as needed for shortness of breath  - oxygen support  with 2 L nasal canula   HYPOKALEMIA -unclear etiology - supplemented  DEHYDRATION  - provide IV fluids with NS @ 125 cc/hr    EDUCATION  - patient is aware of plan of care and treatment    LOS: 4 days   Keiron Iodice 12/09/2011, 2:44 PM  [facility] Pager: [phone]

## 2011-12-10 LAB — CBC
HCT: 32.3 % — ABNORMAL LOW (ref 39.0–52.0)
Hemoglobin: 11.7 g/dL — ABNORMAL LOW (ref 13.0–17.0)
MCH: 34 pg (ref 26.0–34.0)
MCHC: 36.2 g/dL — ABNORMAL HIGH (ref 30.0–36.0)
MCV: 93.9 fL (ref 78.0–100.0)
RDW: 14.4 % (ref 11.5–15.5)

## 2011-12-10 LAB — BASIC METABOLIC PANEL
BUN: 11 mg/dL (ref 6–23)
Creatinine, Ser: 0.6 mg/dL (ref 0.50–1.35)
GFR calc Af Amer: >90 mL/min (ref >90)
GFR calc non Af Amer: >90 mL/min (ref >90)
Glucose, Bld: 102 mg/dL — ABNORMAL HIGH (ref 70–99)
Potassium: 3.4 mEq/L — ABNORMAL LOW (ref 3.5–5.1)

## 2011-12-10 MED ORDER — OSELTAMIVIR PHOSPHATE 75 MG PO CAPS: 75.0000 mg | ORAL_CAPSULE | Freq: Two times a day (BID) | ORAL | Status: AC

## 2011-12-10 MED ORDER — AMOXICILLIN-POT CLAVULANATE 875-125 MG PO TABS: 1.0000 | ORAL_TABLET | Freq: Two times a day (BID) | ORAL | Status: AC

## 2011-12-10 MED ORDER — IPRATROPIUM BROMIDE HFA 17 MCG/ACT IN AERS: 2.0000 | INHALATION_SPRAY | Freq: Four times a day (QID) | RESPIRATORY_TRACT | Status: DC

## 2011-12-10 MED ORDER — POTASSIUM CHLORIDE ER 10 MEQ PO TBCR: 20.0000 meq | EXTENDED_RELEASE_TABLET | Freq: Two times a day (BID) | ORAL | Status: DC

## 2011-12-10 MED ORDER — GUAIFENESIN ER 600 MG PO TB12: 600.0000 mg | ORAL_TABLET | Freq: Two times a day (BID) | ORAL | Status: AC

## 2011-12-10 NOTE — Progress Notes (Signed)
Physical Therapy Treatment Patient Details Name: Glen Green MRN: [id number] DOB: 10-Oct-1941 Today's Date: 12/10/2011  PT Assessment/Plan  PT - Assessment/Plan Comments on Treatment Session: Pt progressed well with minimal LE weakness this session. Signing off on pt, all education complete to encourage pt to ambulate and continue mobility upon d/c PT Plan: All goals met and education completed, patient dischaged from PT services Follow Up Recommendations: None Equipment Recommended: None recommended by PT PT Goals  Acute Rehab PT Goals PT Goal Formulation: With patient PT Goal: Ambulate - Progress: Met PT Goal: Up/Down Stairs - Progress: Progressing toward goal PT Goal: Perform Home Exercise Program - Progress: Met  PT Treatment Precautions/Restrictions  Precautions Precautions: Fall Precaution Comments: drops sats on room air while ambulating Mobility (including Balance) Bed Mobility Bed Mobility: Yes Supine to Sit: 6: Modified independent (Device/Increase time) Sitting - Scoot to Edge of Bed: 6: Modified independent (Device/Increase time) Sit to Supine - Right: 6: Modified independent (Device/Increase time) Transfers Transfers: Yes Sit to Stand: 6: Modified independent (Device/Increase time);From bed;From toilet;With upper extremity assist Stand to Sit: 6: Modified independent (Device/Increase time);With upper extremity assist;To chair/3-in-1 Ambulation/Gait Ambulation/Gait: Yes Ambulation/Gait Assistance: 5: Supervision Ambulation/Gait Assistance Details (indicate cue type and reason): Pt able to maintain sats and ambulate increased distance without any shortness of breath Ambulation Distance (Feet): 150 Feet Assistive device: None Gait Pattern: Within Functional Limits    Exercise  General Exercises - Lower Extremity Long Arc Quad: AROM;Strengthening;Both;10 reps;Seated Hip Flexion/Marching: AROM;Strengthening;Both;10 reps;Seated End of Session PT - End of  Session Equipment Utilized During Treatment: Gait belt Activity Tolerance: Patient tolerated treatment well Patient left: in bed;with call bell in reach;with family/visitor present Nurse Communication: Mobility status for transfers;Mobility status for ambulation General Behavior During Session: [facility] for tasks performed Cognition: [facility] for tasks performed  Milana Kidney 12/10/2011, 3:20 PM  12/10/2011 Milana Kidney DPT PAGER: [phone] OFFICE: [phone]

## 2011-12-10 NOTE — Progress Notes (Signed)
Pt and wife given discharge instructions. Verbalized understanding. Will f/u with PCP in 1 week. Prescriptions given to the pt and wife. No s/s of distress noted at the time. IV removed. Pt left unit in wheelchair with complaints. Ramond Craver, RN

## 2011-12-10 NOTE — Discharge Summary (Signed)
Patient ID: Glen Green MRN: [id number] DOB/AGE: Jan 22, 1941 70 y.o.  Admit date: 12/05/2011 Discharge date: 12/10/2011  Primary Care Physician:  Lorenda Peck, MD, MD  Discharge Diagnoses:    Present on Admission:  .Weakness generalized .Pneumonia due to Hemophilus influenza .Dehydration .Protein-calorie malnutrition, moderate .Hyponatremia .Hypokalemia  Principal Problem:  *Pneumonia due to Hemophilus influenza Active Problems:  Weakness generalized  Hyponatremia  Dehydration  Hypokalemia  Protein-calorie malnutrition, moderate   Current Discharge Medication List    START taking these medications   Details  amoxicillin-clavulanate (AUGMENTIN) 875-125 MG per tablet Take 1 tablet by mouth 2 (two) times daily. Qty: 14 tablet, Refills: 0    guaiFENesin (MUCINEX) 600 MG 12 hr tablet Take 1 tablet (600 mg total) by mouth 2 (two) times daily. Qty: 30 tablet, Refills: 0    ipratropium (ATROVENT HFA) 17 MCG/ACT inhaler Inhale 2 puffs into the lungs every 6 (six) hours. Qty: 1 Inhaler, Refills: 12    oseltamivir (TAMIFLU) 75 MG capsule Take 1 capsule (75 mg total) by mouth 2 (two) times daily. Qty: 20 capsule, Refills: 0      CONTINUE these medications which have NOT CHANGED   Details  levothyroxine (SYNTHROID) 50 MCG tablet Take 50 mcg by mouth daily.          Disposition and Follow-up: *follow up with PCP  Consults:   1. Physical therapy  Significant Diagnostic Studies:  Dg Chest Port 1 View  12/05/2011  *RADIOLOGY REPORT*  Clinical Data: Weakness, cough, fever.  PORTABLE CHEST - 1 VIEW  Comparison: 11/28/2011  Findings: Interval development of bilateral airspace opacities, more pronounced on the right, which is concerning for pneumonia given the clinical presentation.  Biapical scarring and interstitial prominence.  Blunted left costophrenic angle; small pleural effusion. Left hemithorax volume loss, in keeping with a history of left upper lobectomy.   No pneumothorax. Central vascular congestion. Otherwise, stable cardiomediastinal contours.  The patient is status post esophagectomy with gastric pull-through.  No acute osseous abnormality.  IMPRESSION: Multifocal airspace opacities, most pronounced within the periphery of the right upper lung.  Given the clinical presentation, concerning for pneumonia.  Original Report Authenticated By: Waneta Martins, M.D.    Brief H and P:  70 year old male with history significant for esophageal cancer status post chemotherapy in 2009, lung cancer status post left upper lobectomy, hypothyroidism who presents to the emergency room with complaints of generalized weakness for one week prior to the admission. Patient also complained of having progressively worsening shortness of breath associated with cough productive of whitish sputum, subjective fever at home, chills. These symptoms started about one week prior to the admission. Patient initially thought he had the flu and has tried symptomatic treatment with Tylenol and cough syrup but with little symptomatic relief. As per patient's wife patient's appetite has significantly decreased since he has been feeling just generalized weakness and flulike symptoms. Patient has no complaints of chest pain other than the pain when he coughs. No complaints of palpitations, no lightheadedness or dizziness, no loss of consciousness. Patient had no complaints of abdominal pain.   Physical Exam on Discharge:  Filed Vitals:   12/09/11 0500 12/09/11 1400 12/09/11 2100 12/10/11 0600  BP: 112/68 141/84 123/69 117/71  Pulse: 72 77 68 70  Temp: 97.8 F (36.6 C) 98 F (36.7 C) 98.1 F (36.7 C) 98.1 F (36.7 C)  TempSrc: Oral Oral Oral Oral  Resp: 18 18 18 18   Height:      Weight: 61.7 kg (  136 lb 0.4 oz)   58.5 kg (128 lb 15.5 oz)  SpO2: 94% 95% 92% 91%     Intake/Output Summary (Last 24 hours) at 12/10/11 1435 Last data filed at 12/10/11 0959  Gross per 24 hour    Intake    200 ml  Output      0 ml  Net    200 ml    General: Alert, awake, oriented x3, in no acute distress. HEENT: No bruits, no goiter. Heart: Regular rate and rhythm, without murmurs, rubs, gallops. Lungs: Clear to auscultation bilaterally. Abdomen: Soft, nontender, nondistended, positive bowel sounds. Extremities: No clubbing cyanosis or edema with positive pedal pulses. Neuro: Grossly intact, nonfocal.  CBC:    Component Value Date/Time   WBC 6.0 12/10/2011 0500   WBC 5.0 05/10/2008 0822   HGB 11.7* 12/10/2011 0500   HGB 14.5 05/10/2008 0822   HCT 32.3* 12/10/2011 0500   HCT 42.8 05/10/2008 0822   PLT 392 12/10/2011 0500   PLT 241 05/10/2008 0822   MCV 93.9 12/10/2011 0500   MCV 94.4 05/10/2008 0822   NEUTROABS 9.9* 12/05/2011 1620   NEUTROABS 2.7 05/10/2008 0822   LYMPHSABS 0.3* 12/05/2011 1620   LYMPHSABS 1.5 05/10/2008 0822   MONOABS 0.5 12/05/2011 1620   MONOABS 0.6 05/10/2008 0822   EOSABS 0.0 12/05/2011 1620   EOSABS 0.2 05/10/2008 0822   BASOSABS 0.0 12/05/2011 1620   BASOSABS 0.0 05/10/2008 0822    Basic Metabolic Panel:    Component Value Date/Time   NA 136 12/10/2011 0500   K 3.4* 12/10/2011 0500   CL 102 12/10/2011 0500   CO2 28 12/10/2011 0500   BUN 11 12/10/2011 0500   CREATININE 0.60 12/10/2011 0500   CREATININE 1.03 11/26/2011 0810   GLUCOSE 102* 12/10/2011 0500   CALCIUM 8.3* 12/10/2011 0500   Assessment/Plan:   Principal Problem:   *WEAKNESS GENERALIZED  - likely secondary to dehydration and influenza virus - patient can continue taking antibiotic and antiviral on discharge  Active Problems:   PNEUMONIA  - influenza and community acquired pneumonia  - Blood cultures to date show no growth  - continue tamiflu for 10 more days on discharge - patient will be discharged on augmentin 875 mg BID for 7 more days for community acquired pneumonia  HYPOKALEMIA  -unclear etiology  - supplemented   DEHYDRATION  - resolved  EDUCATION  - patient is  aware of plan of care and treatment    Time spent on Discharge: Greater than 30 minutes  Signed: Dariah Mcsorley 12/10/2011, 2:35 PM

## 2011-12-11 LAB — CULTURE, BLOOD (ROUTINE X 2): Culture  Setup Time: 201212261418

## 2012-02-15 IMAGING — CT CT ABDOMEN W/ CM
2 of 5 series · 12 of 36 positions shown, 19 images · IV contrast (READICAT/WATER & [ID] OMNI 300)
Comparison: 05/22/2011.

CT CHEST

CLINICAL DATA: Esophageal cancer.  Left lung cancer.

CT CHEST AND ABDOMEN WITH CONTRAST
TECHNIQUE: Multidetector CT imaging of the chest and abdomen was
performed following the standard protocol during bolus
administration of intravenous contrast.
Contrast: 100mL OMNIPAQUE IOHEXOL 300 MG/ML IV SOLN

[Series 601: coronal body · coronal · 0.84mm/px · 1 of 125 slices shown, 2 images]
[im 42/125  soft-tissue]
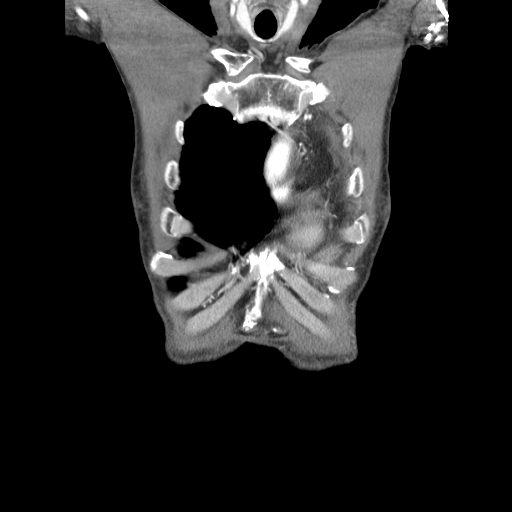
[im 42/125  bone]
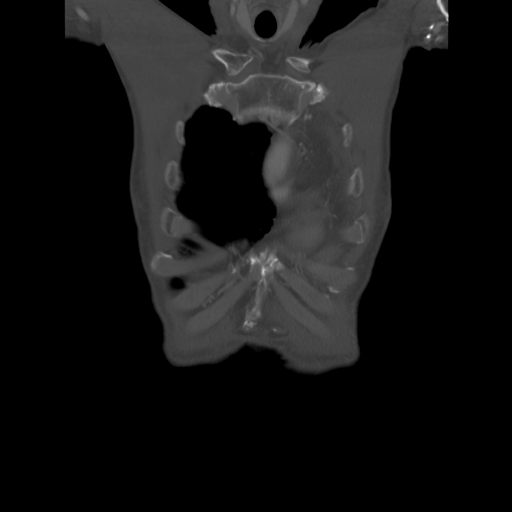

[Series 602: sagittal body · sagittal · 0.84mm/px · 11 of 145 slices shown, 17 images]
[im 12/145  soft-tissue]
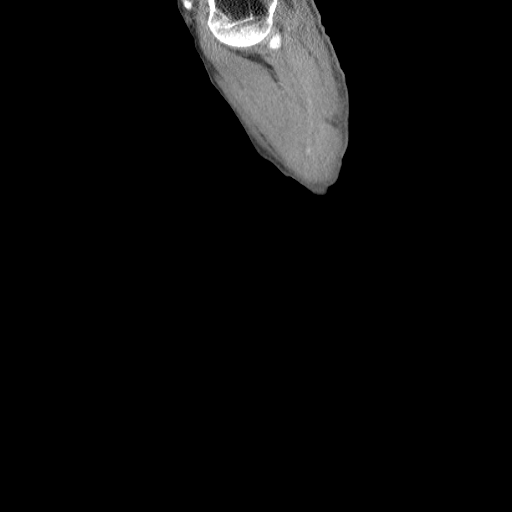
[im 12/145  lung]
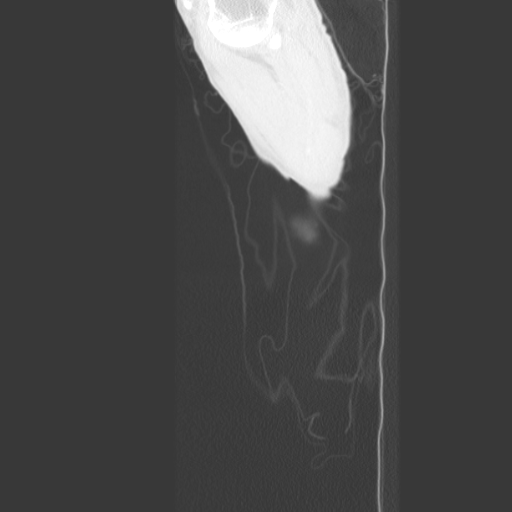
[im 12/145  bone]
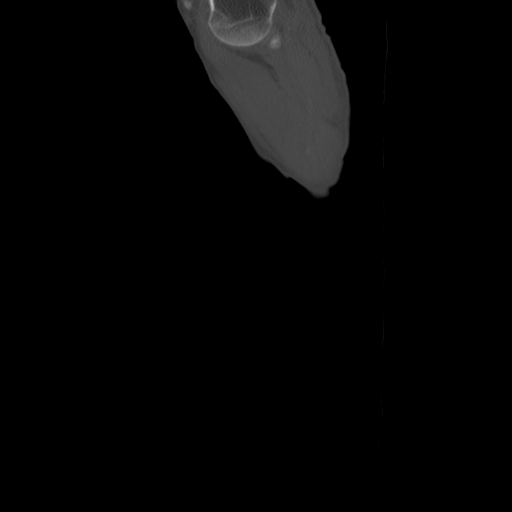
[im 23/145  soft-tissue]
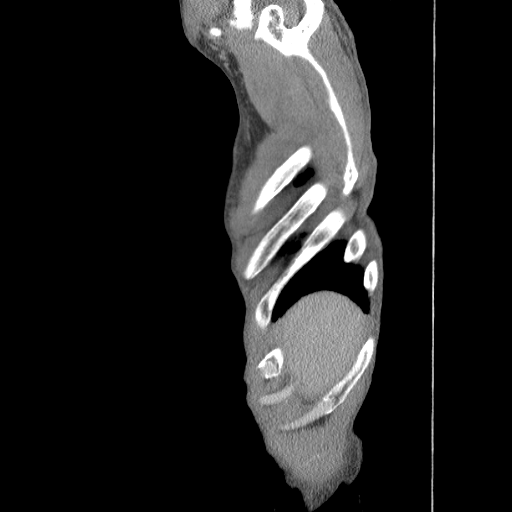
[im 23/145  lung]
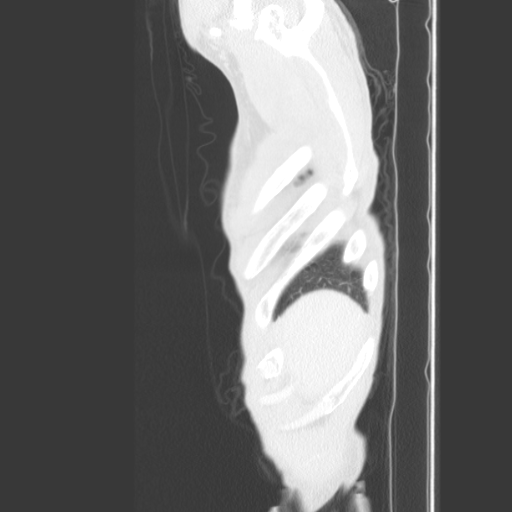
[im 34/145  soft-tissue]
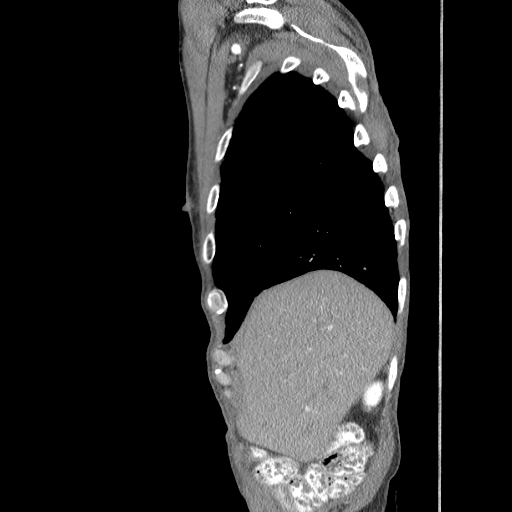
[im 34/145  lung]
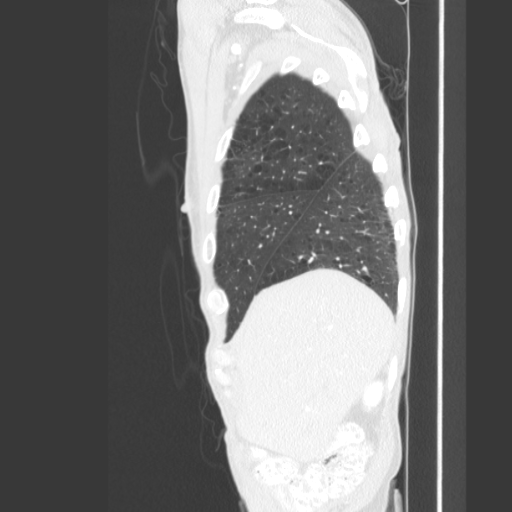
[im 45/145  soft-tissue]
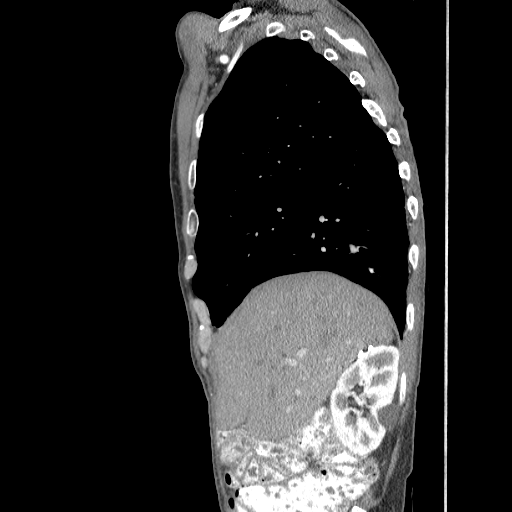
[im 45/145  lung]
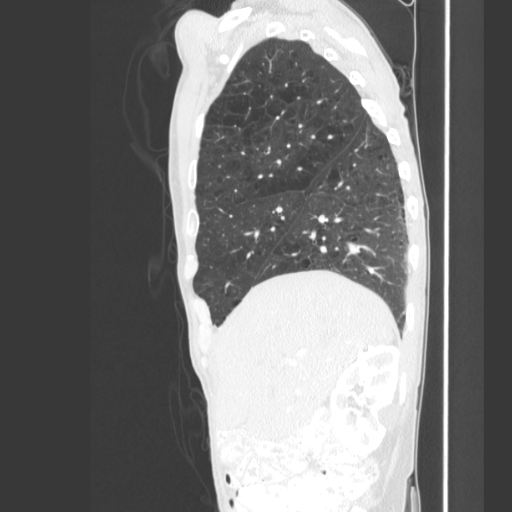
[im 56/145  soft-tissue]
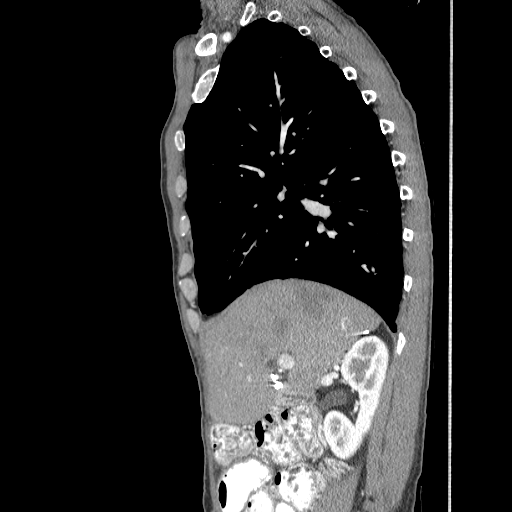
[im 78/145  soft-tissue]
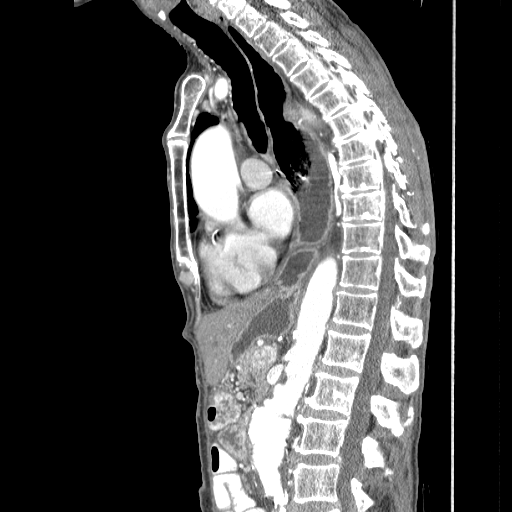
[im 89/145  soft-tissue]
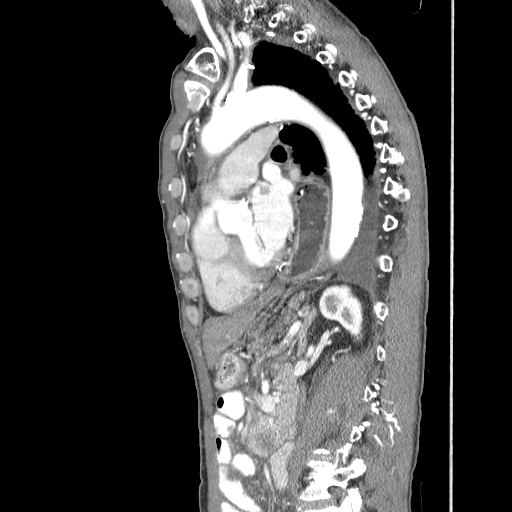
[im 100/145  soft-tissue]
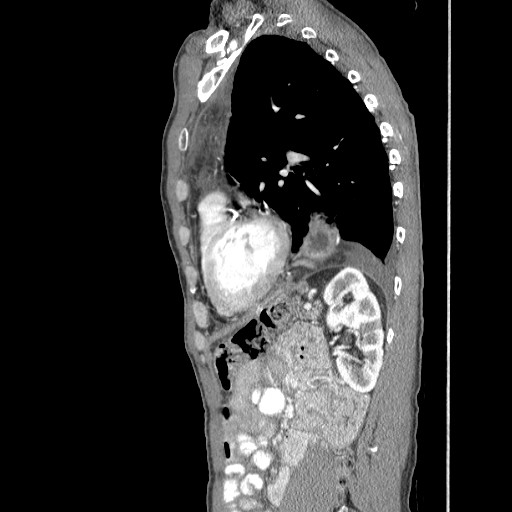
[im 111/145  soft-tissue]
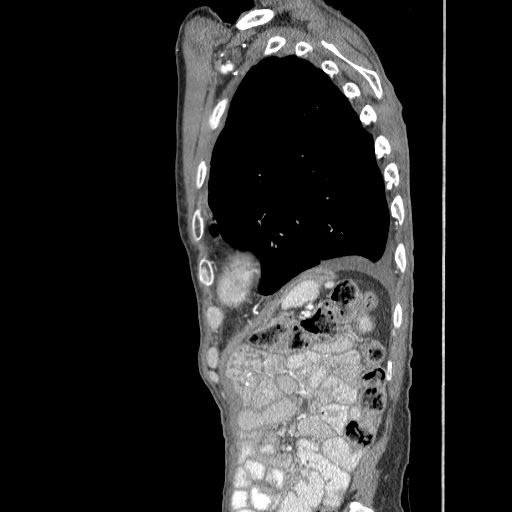
[im 111/145  bone]
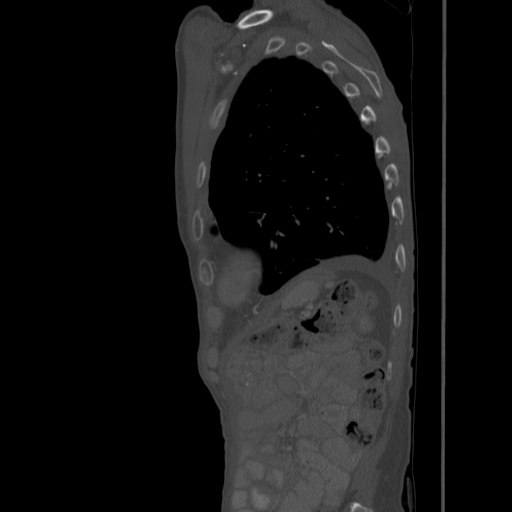
[im 122/145  soft-tissue]
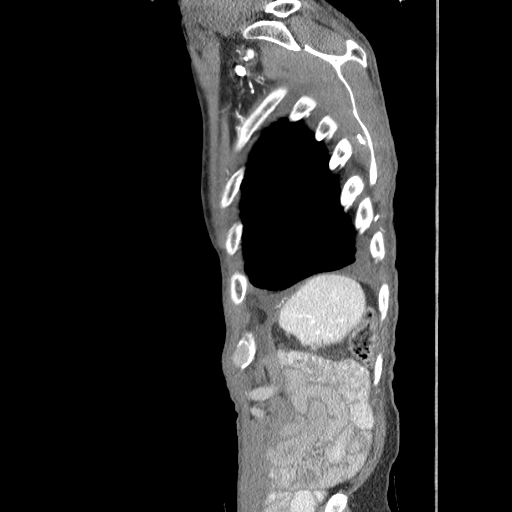
[im 133/145  soft-tissue]
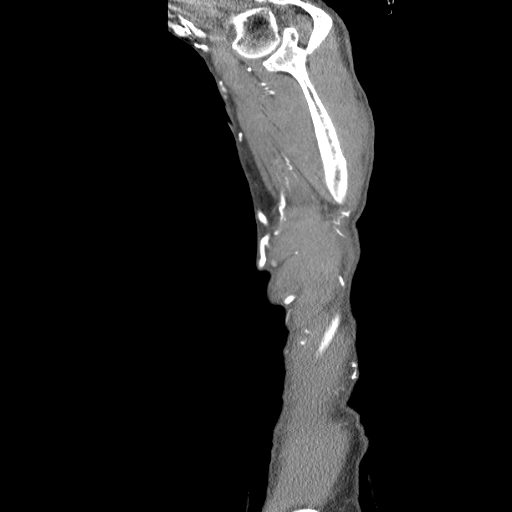

[12 of 36 positions shown; findings below may reference images not displayed]

FINDINGS: Ascending aorta measures up to 3.9 cm, stable.  Coronary
artery calcification.  Heart size normal.  No pericardial effusion.
No pathologically enlarged mediastinal, hilar or axillary lymph
nodes.  Postoperative changes of esophagectomy and gastric pull-
through.

Emphysema.  Postoperative changes of left upper lobectomy.  Lungs
are otherwise clear.  Small left pleural effusion, stable.
IMPRESSION: 1.  Postoperative changes of left upper lobectomy with small left
pleural effusion, stable.
2.  Postoperative changes of esophagectomy and gastrectomy.
3. Borderline ascending aortic aneurysm.  Coronary artery
calcification.

CT ABDOMEN
FINDINGS: Liver is unremarkable.  Cholecystectomy.  Prominence of
the biliary tree is stable and likely postsurgical.  Right adrenal
gland is not visualized.  Low attenuation lesions in the right
kidney measure up to 2.3 cm, as before.  Spleen, pancreas, stomach
and visualized bowel are unremarkable.

Infrarenal aortic aneurysm measures 4.1 cm, stable when remeasured
on the prior study.  Retroperitoneal lymph nodes measure up to 8 mm
in the left periaortic station, stable.  No worrisome lytic or
sclerotic lesions.
IMPRESSION: 1.  No evidence of metastatic disease in the abdomen.
2.  Infrarenal aortic aneurysm.

## 2012-02-22 IMAGING — CR DG CHEST 1V PORT
1 series · 1 of 1 positions shown · non-contrast
Comparison: 11/28/2011

CLINICAL DATA: Weakness, cough, fever.

PORTABLE CHEST - 1 VIEW

[AP]
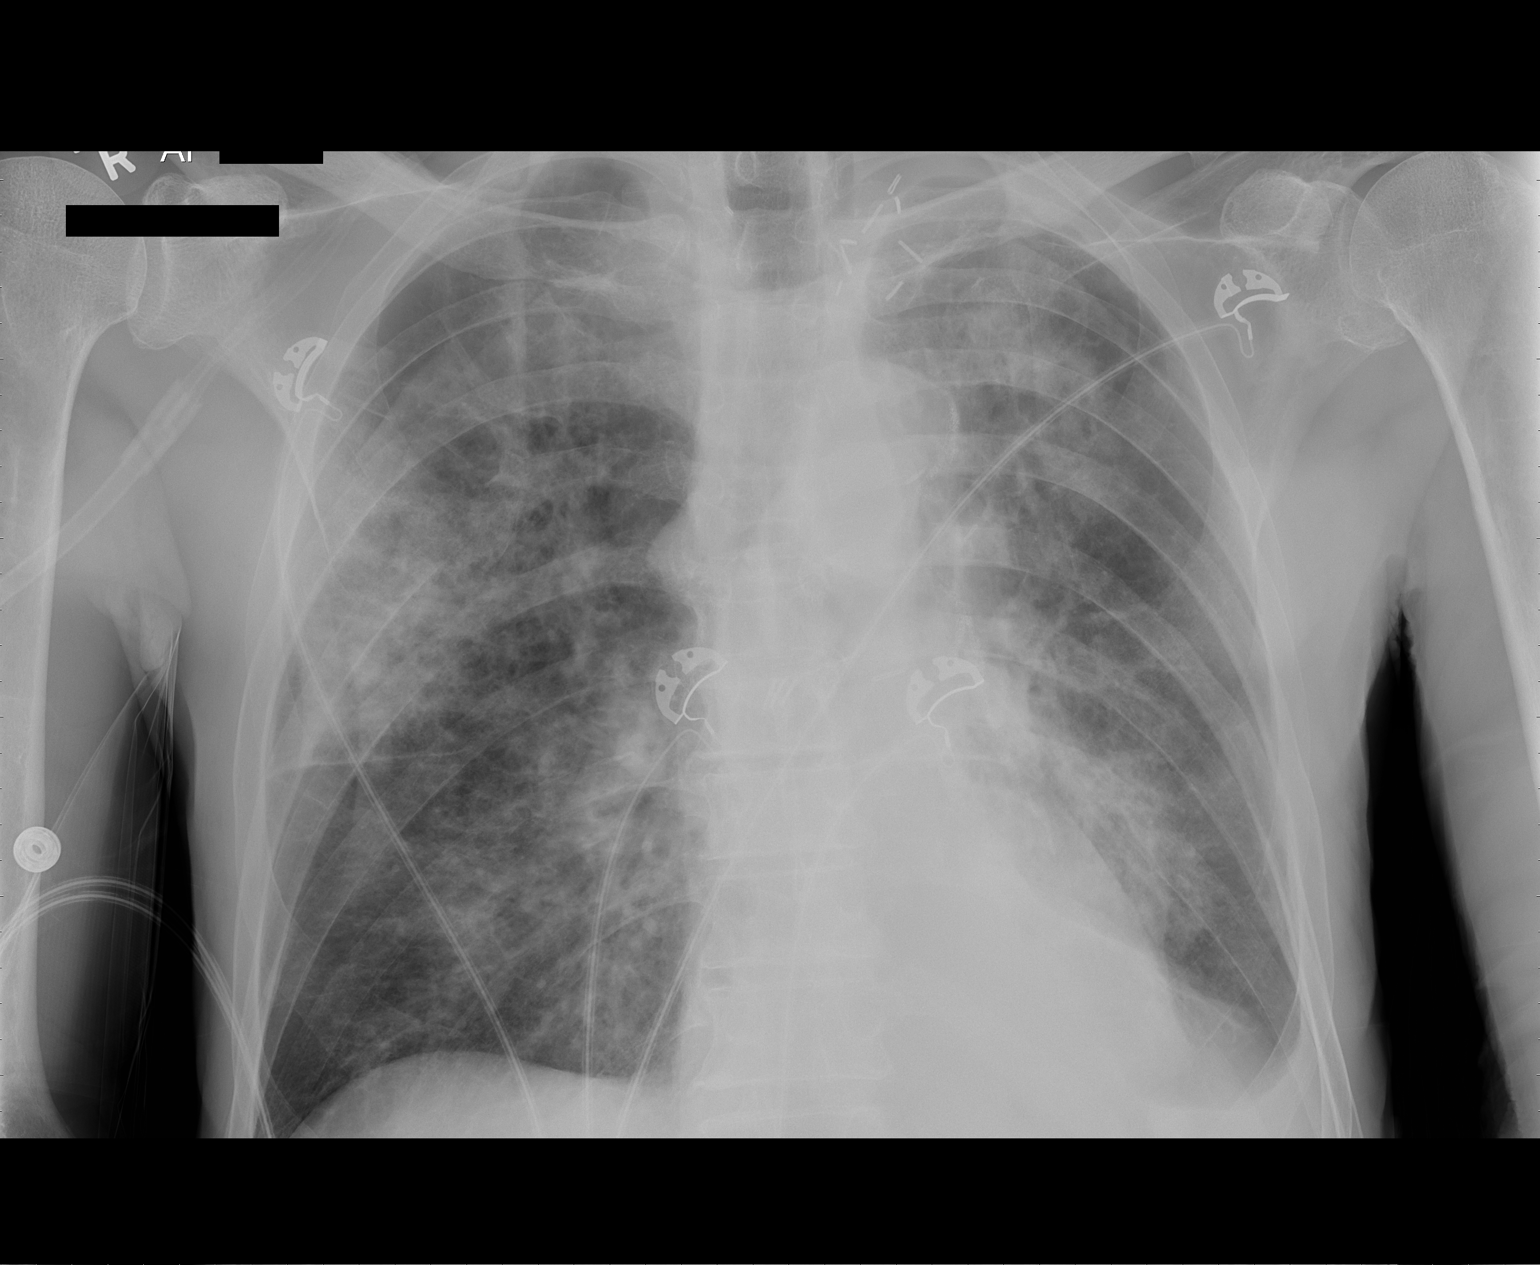

[1 of 1 positions shown; findings below may reference images not displayed]

FINDINGS: Interval development of bilateral airspace opacities,
more pronounced on the right, which is concerning for pneumonia
given the clinical presentation.  Biapical scarring and
interstitial prominence.  Blunted left costophrenic angle; small
pleural effusion. Left hemithorax volume loss, in keeping with a
history of left upper lobectomy.  No pneumothorax. Central vascular
congestion. Otherwise, stable cardiomediastinal contours.  The
patient is status post esophagectomy with gastric pull-through.  No
acute osseous abnormality.
IMPRESSION: Multifocal airspace opacities, most pronounced within the periphery
of the right upper lung.  Given the clinical presentation,
concerning for pneumonia.

## 2012-04-24 ENCOUNTER — Other Ambulatory Visit: Payer: Self-pay | Admitting: Thoracic Surgery

## 2012-04-24 DIAGNOSIS — C159 Malignant neoplasm of esophagus, unspecified: Secondary | ICD-10-CM

## 2012-05-23 ENCOUNTER — Other Ambulatory Visit (HOSPITAL_COMMUNITY): Payer: Self-pay | Admitting: Radiology

## 2012-05-26 ENCOUNTER — Ambulatory Visit
Admission: RE | Admit: 2012-05-26 | Discharge: 2012-05-26 | Disposition: A | Discharge status: Home / Self Care | Payer: Medicare Other | Source: Ambulatory Visit | Attending: Thoracic Surgery | Admitting: Thoracic Surgery

## 2012-05-26 DIAGNOSIS — C159 Malignant neoplasm of esophagus, unspecified: Secondary | ICD-10-CM

## 2012-05-26 MED ORDER — IOHEXOL 300 MG/ML  SOLN
100.0000 mL | Freq: Once | INTRAMUSCULAR | Status: AC | PRN
  Administered 2012-05-26: 100 mL via INTRAVENOUS

## 2012-05-28 ENCOUNTER — Encounter: Payer: Self-pay | Admitting: Thoracic Surgery

## 2012-05-28 ENCOUNTER — Ambulatory Visit (INDEPENDENT_AMBULATORY_CARE_PROVIDER_SITE_OTHER): Payer: Medicare Other | Admitting: Thoracic Surgery

## 2012-05-28 VITALS — BP 150/88 | HR 76 | Resp 20 | Ht 70.0 in | Wt 138.0 lb

## 2012-05-28 DIAGNOSIS — Z8501 Personal history of malignant neoplasm of esophagus: Secondary | ICD-10-CM

## 2012-05-28 DIAGNOSIS — Z85118 Personal history of other malignant neoplasm of bronchus and lung: Secondary | ICD-10-CM

## 2012-05-28 NOTE — Progress Notes (Signed)
HPI the patient returns for followup. He is now 9 years since resection of his esophageal cancer and 2 years since his resection of his left lung cancer. CT scan of the chest and abdomen shows fusiform dilatation of the descending aorta no evidence of recurrence of his lung or esophageal cancer. CT scan of the abdomen shows that it aneurysm is 44.1 cm which is stable and there is no evidence of return of his cancers. We'll have him see him in a year with another set of CT scans by Dr. Tyrone Sage.   Current Outpatient Prescriptions  Medication Sig Dispense Refill  . guaiFENesin (MUCINEX) 600 MG 12 hr tablet Take 1 tablet (600 mg total) by mouth 2 (two) times daily.  30 tablet  0  . ipratropium (ATROVENT HFA) 17 MCG/ACT inhaler Inhale 2 puffs into the lungs every 6 (six) hours.  1 Inhaler  12  . levothyroxine (SYNTHROID) 50 MCG tablet Take 50 mcg by mouth daily.        . potassium chloride (K-DUR) 10 MEQ tablet Take 2 tablets (20 mEq total) by mouth 2 (two) times daily.  14 tablet  0     Review of Systems: Unchanged  Physical Exam lungs clear to station percussion   Diagnostic Tests: CT scan shows no evidence for recurrence of cancer abdominal aortic aneurysm is stable at 4.1 cm   Impression: Status post resection of esophageal cancer with chemotherapy status post adrenalectomy status post left lung cancer resection abdominal aortic aneurysm   Plan: Return in one year see Dr. Tyrone Sage with CT scan

## 2012-08-13 IMAGING — CT CT ABDOMEN WO/W CM
3 of 8 series · 13 of 36 positions shown, 18 images · IV contrast (READICAT/WATER & [ID] OMNI 300)
Comparison: Multiple prior examinations, the most recent is
11/28/2011.

CT CHEST

CLINICAL DATA: Esophageal cancer and lung cancer.

CT CHEST WITH CONTRAST AND CT ABDOMEN WITHOUT AND WITH CONTRAST
TECHNIQUE: Multidetector CT imaging of the abdomen was performed
without intravenous contrast. Multidetector CT imaging of the chest
and abdomen was then performed during bolus administration of
intravenous contrast.
Contrast: 100mL OMNIPAQUE IOHEXOL 300 MG/ML  SOLN,

[Series 5: abdomen with · axial · 0.70mm/px · z∈[-382,-107]mm · 4 of 93 slices shown, 9 images]
[im 19/93  soft-tissue]
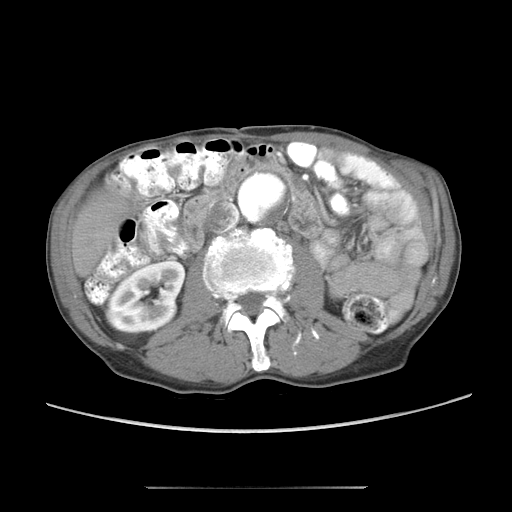
[im 19/93  lung]
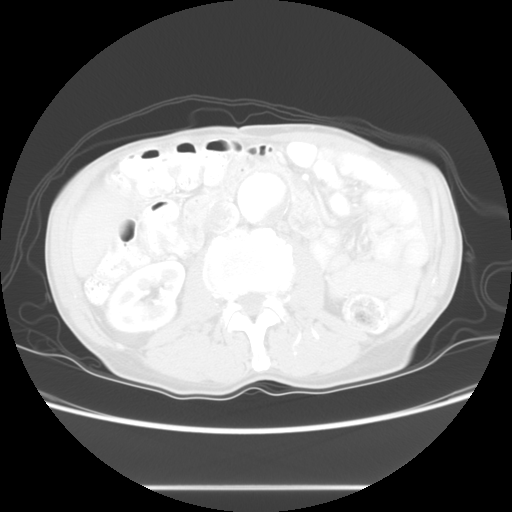
[im 19/93  bone]
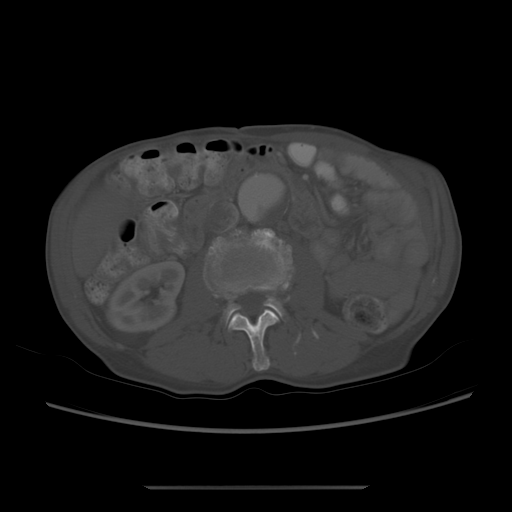
[im 37/93  soft-tissue]
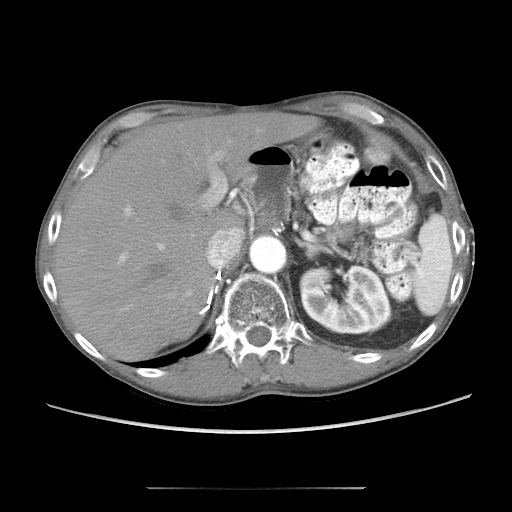
[im 37/93  lung]
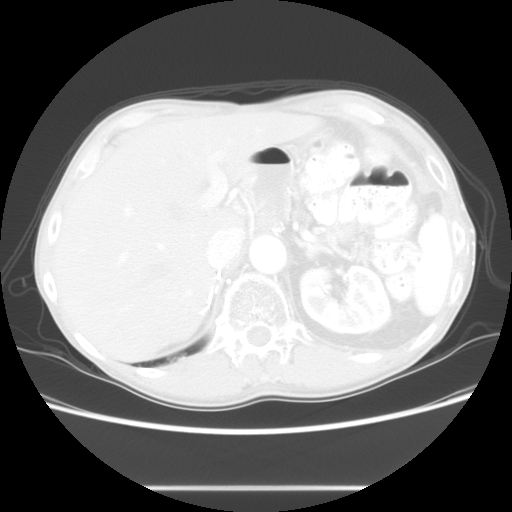
[im 56/93  soft-tissue]
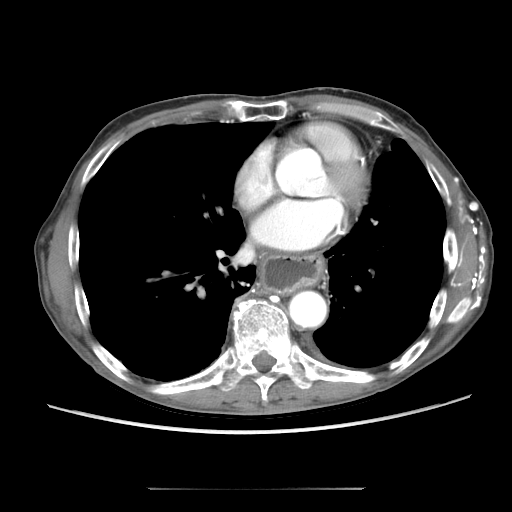
[im 56/93  lung]
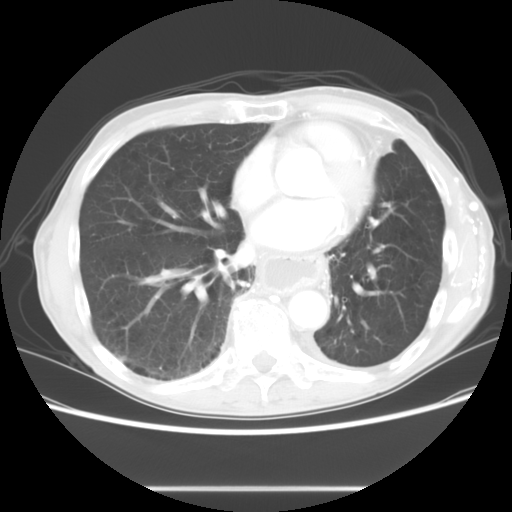
[im 74/93  soft-tissue]
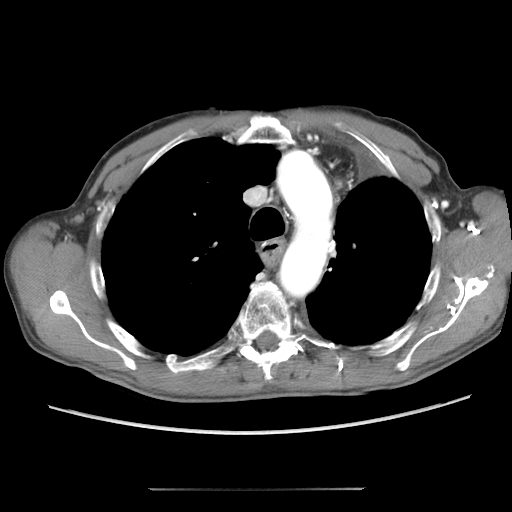
[im 74/93  lung]
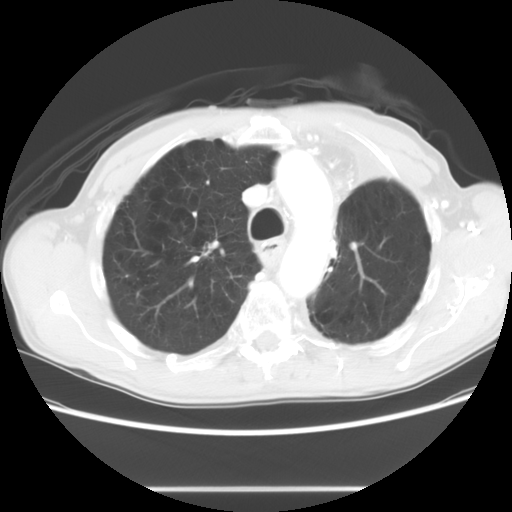

[Series 602: sagittal body · sagittal · 0.70mm/px · 2 of 131 slices shown (1 of 2)]
[im 19/131  soft-tissue]
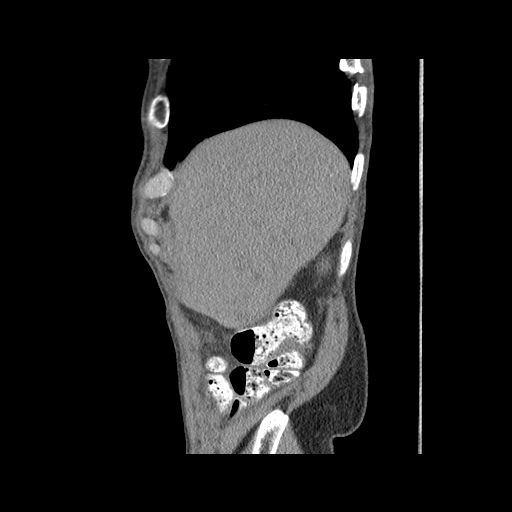
[im 38/131  soft-tissue]
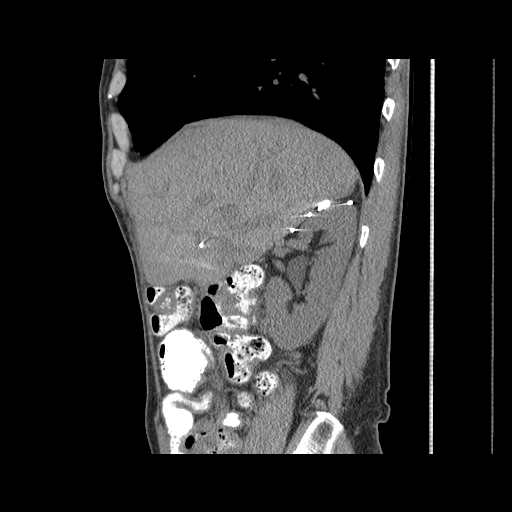

[Series 605: sagittal body · sagittal · 0.92mm/px · 7 of 145 slices shown (2 of 2)]
[im 19/145  soft-tissue]
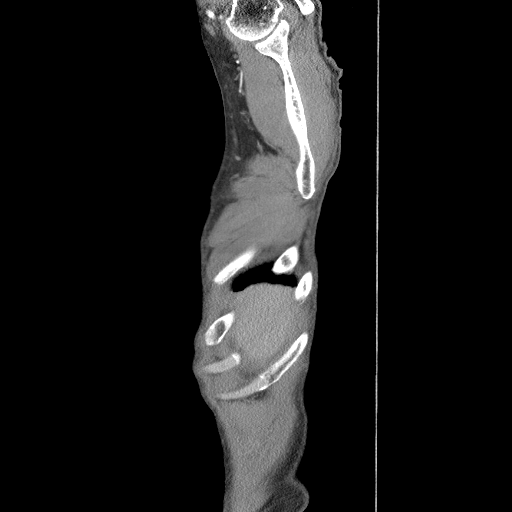
[im 37/145  soft-tissue]
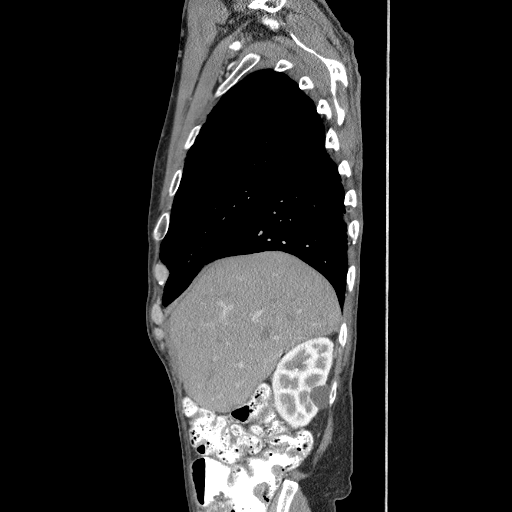
[im 55/145  soft-tissue]
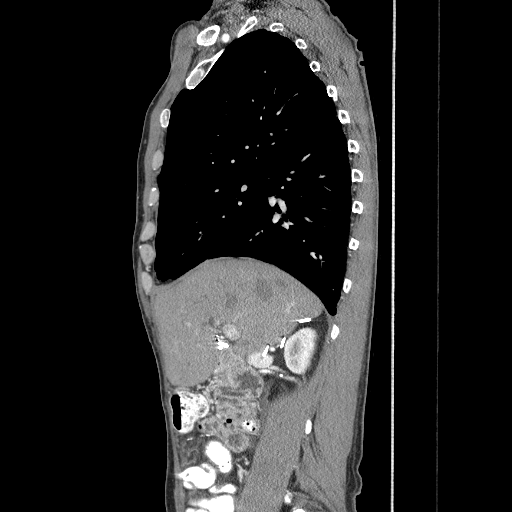
[im 73/145  soft-tissue]
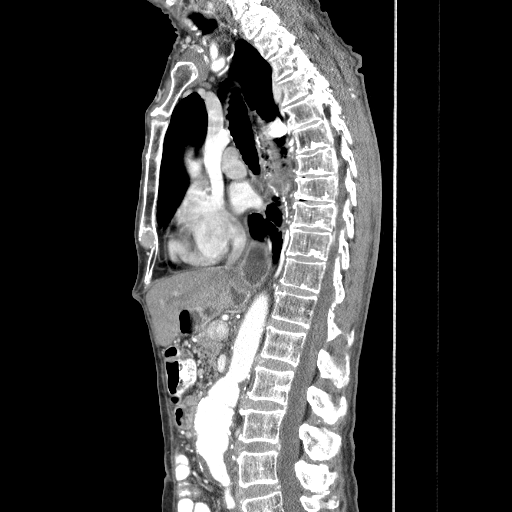
[im 91/145  soft-tissue]
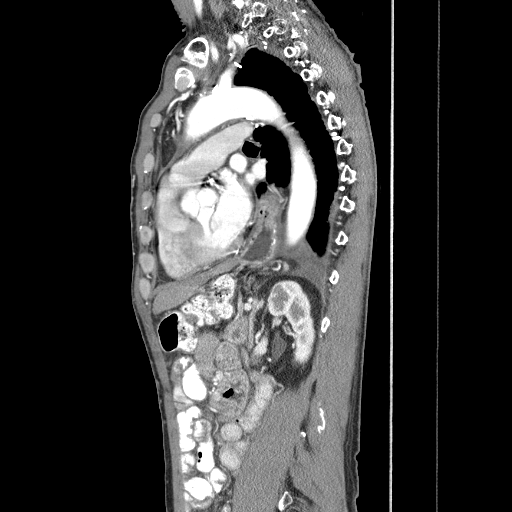
[im 109/145  soft-tissue]
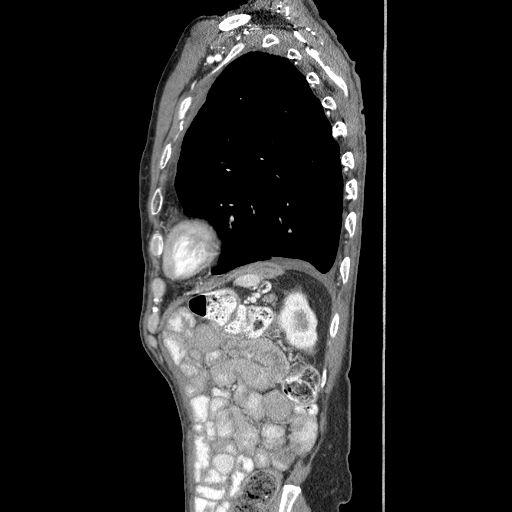
[im 127/145  soft-tissue]
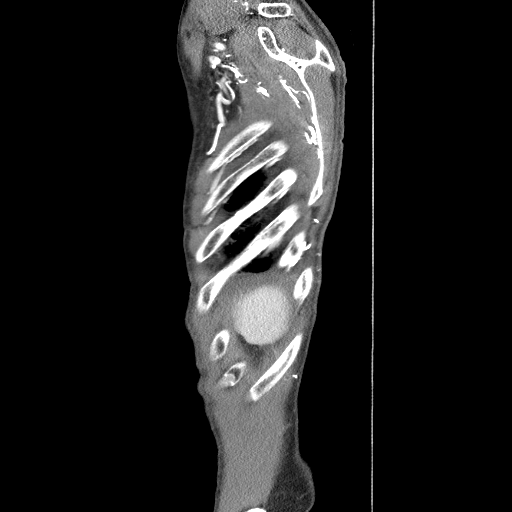

[13 of 36 positions shown; findings below may reference images not displayed]

FINDINGS: The chest wall is stable.  No enlarged supraclavicular
or axillary lymph nodes.  Small scattered lymph nodes are
unchanged.  The bony thorax is intact.  Stable degenerative changes
and osteoporosis.  No destructive bone lesions or spinal canal
compromise.

The heart is normal in size.  No pericardial effusion.  No
mediastinal or hilar lymphadenopathy.  Stable fusiform aneurysmal
dilatation of the ascending thoracic aorta with maximal measurement
of 3.9 cm.  No dissection.  Stable advanced three-vessel coronary
artery calcifications.

Stable surgical changes from an esophagectomy and gastric pull-
through procedure.  No complicating features are demonstrated.  No
findings to suggest recurrent tumor or metastatic disease in the
chest.  Stable emphysematous changes in the lungs and stable
surgical changes involving the left hemithorax.  No worrisome
pulmonary nodules or masses.
IMPRESSION: 1.  Stable surgical changes from esophagectomy and gastric pull-
through and left upper lobe lobectomy.
2.  No CT findings for recurrent tumor, adenopathy or metastatic
pulmonary disease.
3.  Stable emphysematous changes and pulmonary scarring.
4.  Stable fusiform aneurysmal dilatation of the ascending aorta.

CT ABDOMEN
FINDINGS: The liver is unremarkable and stable.  No findings for
hepatic metastatic disease.  The gallbladder is surgically absent.
No biliary dilatation.  The pancreas is grossly normal and stable.
Moderate pancreatic atrophy.  The spleen is normal in size.  No
focal lesions.  The adrenal glands and kidneys are unremarkable and
stable.  A right renal cyst is again noted.

Stable surgical changes.  Stable small left sided retroperitoneal
lymph nodes.  The aorta demonstrates a stable infrarenal abdominal
aortic aneurysm with maximal measurements of 4.1 x 4.0 cm.
Advanced just above the iliac artery bifurcation.  Extensive mural
thrombus.  The major branch vessels are patent.  Moderate
calcifications at the branch vessel ostia.

The stomach demonstrates stable surgical changes.  The duodenum,
small bowel and colon are unremarkable.

No significant bony findings.
IMPRESSION: 1.  No CT findings for abdominal metastatic disease.
2.  Stable retroperitoneal lymph nodes.
3.  Stable infrarenal abdominal aortic aneurysm.

## 2012-08-15 ENCOUNTER — Encounter: Payer: Self-pay | Admitting: Internal Medicine

## 2012-08-15 DIAGNOSIS — D497 Neoplasm of unspecified behavior of endocrine glands and other parts of nervous system: Secondary | ICD-10-CM | POA: Insufficient documentation

## 2012-08-15 DIAGNOSIS — K56609 Unspecified intestinal obstruction, unspecified as to partial versus complete obstruction: Secondary | ICD-10-CM

## 2012-08-15 DIAGNOSIS — Z0000 Encounter for general adult medical examination without abnormal findings: Secondary | ICD-10-CM | POA: Insufficient documentation

## 2012-08-15 HISTORY — DX: Unspecified intestinal obstruction, unspecified as to partial versus complete obstruction: K56.609

## 2012-08-15 HISTORY — DX: Neoplasm of unspecified behavior of endocrine glands and other parts of nervous system: D49.7

## 2012-08-22 ENCOUNTER — Ambulatory Visit (INDEPENDENT_AMBULATORY_CARE_PROVIDER_SITE_OTHER): Payer: Medicare Other | Admitting: Internal Medicine

## 2012-08-22 ENCOUNTER — Other Ambulatory Visit (INDEPENDENT_AMBULATORY_CARE_PROVIDER_SITE_OTHER): Payer: Medicare Other

## 2012-08-22 ENCOUNTER — Encounter: Payer: Self-pay | Admitting: Internal Medicine

## 2012-08-22 VITALS — BP 120/80 | HR 89 | Temp 97.0°F | Ht 70.0 in | Wt 132.5 lb

## 2012-08-22 DIAGNOSIS — I714 Abdominal aortic aneurysm, without rupture, unspecified: Secondary | ICD-10-CM

## 2012-08-22 DIAGNOSIS — Z0000 Encounter for general adult medical examination without abnormal findings: Secondary | ICD-10-CM

## 2012-08-22 DIAGNOSIS — E039 Hypothyroidism, unspecified: Secondary | ICD-10-CM

## 2012-08-22 DIAGNOSIS — I739 Peripheral vascular disease, unspecified: Secondary | ICD-10-CM

## 2012-08-22 DIAGNOSIS — Z125 Encounter for screening for malignant neoplasm of prostate: Secondary | ICD-10-CM

## 2012-08-22 DIAGNOSIS — Z79899 Other long term (current) drug therapy: Secondary | ICD-10-CM

## 2012-08-22 DIAGNOSIS — Z23 Encounter for immunization: Secondary | ICD-10-CM

## 2012-08-22 DIAGNOSIS — H9193 Unspecified hearing loss, bilateral: Secondary | ICD-10-CM | POA: Insufficient documentation

## 2012-08-22 HISTORY — DX: Abdominal aortic aneurysm, without rupture, unspecified: I71.40

## 2012-08-22 HISTORY — DX: Abdominal aortic aneurysm, without rupture: I71.4

## 2012-08-22 LAB — CBC WITH DIFFERENTIAL/PLATELET
Basophils Relative: 0.6 % (ref 0.0–3.0)
Eosinophils Absolute: 0.1 10*3/uL (ref 0.0–0.7)
Eosinophils Relative: 2 % (ref 0.0–5.0)
HCT: 41.4 % (ref 39.0–52.0)
Hemoglobin: 13.1 g/dL (ref 13.0–17.0)
Lymphs Abs: 1.4 10*3/uL (ref 0.7–4.0)
MCHC: 31.6 g/dL (ref 30.0–36.0)
MCV: 96.4 fl (ref 78.0–100.0)
Monocytes Absolute: 0.8 10*3/uL (ref 0.1–1.0)
Neutro Abs: 3.3 10*3/uL (ref 1.4–7.7)
Neutrophils Relative %: 58.9 % (ref 43.0–77.0)
RBC: 4.29 Mil/uL (ref 4.22–5.81)
WBC: 5.6 10*3/uL (ref 4.5–10.5)

## 2012-08-22 LAB — HEPATIC FUNCTION PANEL
ALT: 19 U/L (ref 0–53)
Bilirubin, Direct: 0.1 mg/dL (ref 0.0–0.3)
Total Bilirubin: 0.7 mg/dL (ref 0.3–1.2)

## 2012-08-22 LAB — URINALYSIS, ROUTINE W REFLEX MICROSCOPIC
Hgb urine dipstick: NEGATIVE
Ketones, ur: NEGATIVE
Total Protein, Urine: NEGATIVE
Urine Glucose: NEGATIVE
Urobilinogen, UA: 0.2 (ref 0.0–1.0)

## 2012-08-22 LAB — BASIC METABOLIC PANEL
BUN: 25 mg/dL — ABNORMAL HIGH (ref 6–23)
Chloride: 107 mEq/L (ref 96–112)
Creatinine, Ser: 1.4 mg/dL (ref 0.4–1.5)
GFR: 53.24 mL/min — ABNORMAL LOW (ref >60.00)

## 2012-08-22 LAB — LIPID PANEL
Cholesterol: 197 mg/dL (ref 0–200)
LDL Cholesterol: 101 mg/dL — ABNORMAL HIGH (ref 0–99)
Total CHOL/HDL Ratio: 2
Triglycerides: 81 mg/dL (ref 0.0–149.0)
VLDL: 16.2 mg/dL (ref 0.0–40.0)

## 2012-08-22 MED ORDER — ASPIRIN 81 MG PO TBEC: 81.0000 mg | DELAYED_RELEASE_TABLET | Freq: Every day | ORAL | Status: DC

## 2012-08-22 NOTE — Assessment & Plan Note (Signed)
Former smoker, neg stress test per PCP 2012 per pt,  For carotid dopplers  - PVD/r/o stenosis

## 2012-08-22 NOTE — Patient Instructions (Addendum)
You had the flu shot today Please call if you change your mind about the colonoscopy Please go to LAB in the Basement for the blood and/or urine tests to be done today You will be contacted by phone if any changes need to be made immediately.  Otherwise, you will receive a letter about your results with an explanation. Continue all other medications as before Please have the pharmacy call with any refills you may need. Please continue your efforts at being more active, low cholesterol diet, and weight control. You will be contacted regarding the referral for: carotid dopplers for the arteries of the neck (a test which does not hurt) Please return in 1 year for your yearly visit, or sooner if needed, with Lab testing done 3-5 days before

## 2012-08-22 NOTE — Progress Notes (Signed)
Subjective:    Patient ID: [place], male    DOB: 28-May-1941, 71 y.o.   MRN: [id number]  HPI    Here for wellness and to establish;  Overall doing ok;  Pt denies CP, worsening SOB, DOE, wheezing, orthopnea, PND, worsening LE edema, palpitations, dizziness or syncope.  Pt denies neurological change such as new Headache, facial or extremity weakness.  Pt denies polydipsia, polyuria, or low sugar symptoms. Pt states overall good compliance with treatment and medications, good tolerability, and trying to follow lower cholesterol diet.  Pt denies worsening depressive symptoms, suicidal ideation or panic. No fever, recent unintentional wt loss, night sweats, loss of appetite, or other constitutional symptoms.  Pt states good ability with ADL's, low fall risk, home safety reviewed and adequate, no significant changes in hearing or vision, and occasionally active with exercise.  Has not had recent carotid dopplers. No new complaints.  Past Medical History  Diagnosis Date  . Dyslipidemia   . History of esophageal cancer     s/p transhiatal esophagogastrectomy  . Hypothyroidism   . Cerebral aneurysm     [place]. repair  1994  . History of lung cancer   . Pneumonia   . Cancer     Esophageal, adrenal gland, skin; lung  . Stroke 1995    denies residual  . 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 SBO (small bowel obstruction) 08/15/2012    History of small bowel obstruction (status post small bowel       resection, lysis of adhesions, incidental appendectomy, and repair       of left diaphragmatic hernia  . AAA (abdominal aortic aneurysm) 08/22/2012    3.9 cm by CT  - June 2013, stable    Past Surgical History  Procedure Date  . Left upper lobectomy with node dissection 02/24/2010    [place]  . Exploratory laparotomy, lysis of adhesions, reduce of incarcerated small bowel and colon from the chest, limited small bowel resection, incidental appendectomy, and then repair of diaphragmatic  hernia with alloderm mesh 10/27/2009    [place]  . Left subclavian port- a-cath insertion 03/09/2004    [place]  . Exploratory laparotomy,exploratory thoracotomy for hemorrhage 02/11/2003    [place]  . Transhiatal esophagectomy with cholecystectomy, jejunostomy,  pyloroplasty and removal of right subclavian port-a- cath     [facility]  . Brain surgery     brain aneursyn  . Tonsillectomy   . Cholecystectomy     reports that he quit smoking about 9 years ago. His smoking use included Cigarettes. He has never used smokeless tobacco. He reports that he does not drink alcohol or use illicit drugs. family history includes Aneurysm in his maternal grandmother and mother; Heart disease in his maternal uncle and paternal uncle; and Kidney disease in his father. No Known Allergies Current Outpatient Prescriptions on File Prior to Visit  Medication Sig Dispense Refill  . levothyroxine (SYNTHROID) 50 MCG tablet Take 50 mcg by mouth daily.        Marland Kitchen guaiFENesin (MUCINEX) 600 MG 12 hr tablet Take 1 tablet (600 mg total) by mouth 2 (two) times daily.  30 tablet  0  . ipratropium (ATROVENT HFA) 17 MCG/ACT inhaler Inhale 2 puffs into the lungs every 6 (six) hours.  1 Inhaler  12  . potassium chloride (K-DUR) 10 MEQ tablet Take 2 tablets (20 mEq total) by mouth 2 (two) times daily.  14 tablet  0   Review of Systems Review of  Systems  Constitutional: Negative for diaphoresis, activity change, appetite change  HENT: Negative for hearing loss, ear pain, facial swelling, mouth sores and neck stiffness.   Eyes: Negative for pain, redness and visual disturbance.  Respiratory: Negative for shortness of breath and wheezing.   Cardiovascular: Negative for chest pain and palpitations.  Gastrointestinal: Negative for diarrhea, blood in stool, abdominal distention and rectal pain.  Genitourinary: Negative for hematuria, flank pain and decreased urine volume.  Musculoskeletal: Negative for myalgias and joint swelling.    Skin: Negative for color change and wound.  Neurological: Negative for syncope and numbness.  Hematological: Negative for adenopathy.  Psychiatric/Behavioral: Negative for hallucinations, self-injury, decreased concentration and agitation.     Objective:   Physical Exam BP 120/80  Pulse 89  Temp 97 F (36.1 C) (Oral)  Ht 5\' 10"  (1.778 m)  Wt 132 lb 8 oz (60.102 kg)  BMI 19.01 kg/m2  SpO2 99% Physical Exam  VS noted Constitutional: Pt is oriented to person, place, and time. Appears well-developed and well-nourished.  HENT:  Head: Normocephalic and atraumatic.  Right Ear: External ear normal.  Left Ear: External ear normal.  Nose: Nose normal.  Mouth/Throat: Oropharynx is clear and moist.  Eyes: Conjunctivae and EOM are normal. Pupils are equal, round, and reactive to light.  Neck: Normal range of motion. Neck supple. No JVD present. No tracheal deviation present.  Cardiovascular: Normal rate, regular rhythm, normal heart sounds and intact distal pulses.   Pulmonary/Chest: Effort normal and breath sounds normal.  Abdominal: Soft. Bowel sounds are normal. There is no tenderness.  Musculoskeletal: Normal range of motion. Exhibits no edema.  Lymphadenopathy:  Has no cervical adenopathy.  Neurological: Pt is alert and oriented to person, place, and time.o/w not done in detail Skin: Skin is warm and dry. No rash noted.  Psychiatric:  Has  normal mood and affect. Behavior is normal.     Assessment & Plan:

## 2012-08-23 ENCOUNTER — Encounter: Payer: Self-pay | Admitting: Internal Medicine

## 2012-08-23 NOTE — Assessment & Plan Note (Signed)
With AAA, for carotids as well

## 2012-08-23 NOTE — Assessment & Plan Note (Signed)

## 2012-08-27 ENCOUNTER — Other Ambulatory Visit: Payer: Self-pay | Admitting: Cardiology

## 2012-08-27 DIAGNOSIS — I6529 Occlusion and stenosis of unspecified carotid artery: Secondary | ICD-10-CM

## 2012-08-28 ENCOUNTER — Encounter (INDEPENDENT_AMBULATORY_CARE_PROVIDER_SITE_OTHER): Payer: Medicare Other

## 2012-08-28 DIAGNOSIS — I6529 Occlusion and stenosis of unspecified carotid artery: Secondary | ICD-10-CM

## 2012-09-01 ENCOUNTER — Encounter: Payer: Self-pay | Admitting: Internal Medicine

## 2013-05-01 ENCOUNTER — Other Ambulatory Visit: Payer: Self-pay | Admitting: *Deleted

## 2013-05-01 DIAGNOSIS — C159 Malignant neoplasm of esophagus, unspecified: Secondary | ICD-10-CM

## 2013-06-22 LAB — BUN: BUN: 18 mg/dL (ref 6–23)

## 2013-06-22 LAB — CREATININE, SERUM: Creat: 0.99 mg/dL (ref 0.50–1.35)

## 2013-06-25 ENCOUNTER — Ambulatory Visit: Payer: Medicare Other | Admitting: Cardiothoracic Surgery

## 2013-07-02 ENCOUNTER — Ambulatory Visit (INDEPENDENT_AMBULATORY_CARE_PROVIDER_SITE_OTHER): Payer: Medicare Other | Admitting: Cardiothoracic Surgery

## 2013-07-02 ENCOUNTER — Ambulatory Visit
Admission: RE | Admit: 2013-07-02 | Discharge: 2013-07-02 | Disposition: A | Discharge status: Home / Self Care | Payer: Medicare Other | Source: Ambulatory Visit | Attending: Cardiothoracic Surgery | Admitting: Cardiothoracic Surgery

## 2013-07-02 ENCOUNTER — Encounter: Payer: Self-pay | Admitting: Cardiothoracic Surgery

## 2013-07-02 ENCOUNTER — Other Ambulatory Visit: Payer: Self-pay | Admitting: *Deleted

## 2013-07-02 VITALS — BP 94/62 | HR 90 | Resp 20 | Ht 70.0 in | Wt 134.0 lb

## 2013-07-02 DIAGNOSIS — Z85118 Personal history of other malignant neoplasm of bronchus and lung: Secondary | ICD-10-CM

## 2013-07-02 DIAGNOSIS — C159 Malignant neoplasm of esophagus, unspecified: Secondary | ICD-10-CM

## 2013-07-02 DIAGNOSIS — Z8501 Personal history of malignant neoplasm of esophagus: Secondary | ICD-10-CM

## 2013-07-02 DIAGNOSIS — I714 Abdominal aortic aneurysm, without rupture: Secondary | ICD-10-CM

## 2013-07-02 DIAGNOSIS — R222 Localized swelling, mass and lump, trunk: Secondary | ICD-10-CM

## 2013-07-02 MED ORDER — IOHEXOL 300 MG/ML  SOLN
100.0000 mL | Freq: Once | INTRAMUSCULAR | Status: AC | PRN
  Administered 2013-07-02: 100 mL via INTRAVENOUS

## 2013-07-02 NOTE — Progress Notes (Signed)
[address]       [place] [address]             [phone]                    [facility] [facility] Medical Record #[id number] Date of Birth: Jan 28, 1941  Referring: [name], MD Primary Care: [name], MD  Chief Complaint:    Chief Complaint  Patient presents with  . Follow-up    1 year f/u Chest/ABD/Pelvis CT     History of Present Illness:    Patient is a 72 year old male who returns today with a followup CT scan ordered last year by Dr. [name]. He has a long history of multiple different cancers and surgical resection. 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 The patient later underwent removal of the right adrenal gland for metastatic disease. 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 The patient comes in today for a followup scan with no specific new complaints.      Current Activity/ Functional Status:  Patient is independent with mobility/ambulation, transfers, ADL's, IADL's.  Zubrod Score: At the time of surgery this patient's most appropriate activity status/level should be described as: [x]  Normal activity, no symptoms []  Symptoms, fully ambulatory []  Symptoms, in bed less than or equal to 50% of the time []  Symptoms, in bed greater than 50% of the time but less than 100% []  Bedridden []  Moribund   Past Medical History  Diagnosis Date  . Dyslipidemia   . History of esophageal cancer     s/p transhiatal esophagogastrectomy  . Hypothyroidism   . Cerebral aneurysm     [place]. repair  1994  . History of lung cancer   . Pneumonia   . Cancer     Esophageal, adrenal gland, skin; lung  . Stroke 1995    denies residual  . 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 SBO  (small bowel obstruction) 08/15/2012    History of small bowel obstruction (status post small bowel       resection, lysis of adhesions, incidental appendectomy, and repair       of left diaphragmatic hernia  . AAA (abdominal aortic aneurysm) 08/22/2012    3.9 cm by CT  - June 2013, stable     Past Surgical History  Procedure Laterality Date  . Left upper lobectomy with node dissection  02/24/2010    [place]  . Exploratory laparotomy, lysis of adhesions, reduce of incarcerated small bowel and colon from the chest, limited small bowel resection, incidental appendectomy, and then repair of diaphragmatic hernia with alloderm mesh  10/27/2009    [place]  . Left subclavian port- a-cath insertion  03/09/2004    [place]  . Exploratory laparotomy,exploratory thoracotomy for hemorrhage  02/11/2003    [place]  . Transhiatal esophagectomy with cholecystectomy, jejunostomy,  pyloroplasty and removal of right subclavian port-a- cath      [facility]  . Brain surgery      brain aneursyn  . Tonsillectomy    . Cholecystectomy      Family History  Problem Relation Age of Onset  . Aneurysm Mother   . Kidney disease Father   . Heart disease Maternal Uncle   .  Heart disease Paternal Uncle   . Aneurysm Maternal Grandmother     History   Social History  . Marital Status: Married    Spouse Name: N/A    Number of Children: N/A  . Years of Education: N/A   Occupational History  . retired    Social History Main Topics  . Smoking status: Former Smoker    Types: Cigarettes    Quit date: 12/10/2002  . Smokeless tobacco: Never Used  . Alcohol Use: No  . Drug Use: No  . Sexually Active: Not Currently   Other Topics Concern  . Not on file   Social History Narrative  . No narrative on file    History  Smoking status  . Former Smoker  . Types: Cigarettes  . Quit date: 12/10/2002  Smokeless tobacco  . Never Used    History  Alcohol Use No     No Known Allergies  Current Outpatient  Prescriptions  Medication Sig Dispense Refill  . aspirin 81 MG EC tablet Take 1 tablet (81 mg total) by mouth daily. Swallow whole.  30 tablet  12  . levothyroxine (SYNTHROID) 50 MCG tablet Take 50 mcg by mouth daily.         No current facility-administered medications for this visit.       Review of Systems:     Cardiac Review of Systems: Y or N  Chest Pain [  n  ]  Resting SOB [ n  ] Exertional SOB  [n  ]  Orthopnea [n ]   Pedal Edema [ n  ]    Palpitations [ n ] Syncope  [n ]   Presyncope [n  ]  General Review of Systems: [Y] = yes [  ]=no Constitional: recent weight change [n  ]; anorexia [  ]; fatigue [n  ]; nausea [  ]; night sweats [  ]; fever [  ]; or chills [  ];                                                                                                                                          Dental: poor dentition[  ]; Last Dentist visit:   Eye : blurred vision [  ]; diplopia [   ]; vision changes [  ];  Amaurosis fugax[  ]; Resp: cough [  ];  wheezing[  ];  hemoptysis[  ]; shortness of breath[  ]; paroxysmal nocturnal dyspnea[  ]; dyspnea on exertion[  ]; or orthopnea[  ];  GI:  gallstones[  ], vomiting[  ];  dysphagia[  ]; melena[  ];  hematochezia [  ]; heartburn[  ];   Hx of  Colonoscopy[  ]; GU: kidney stones [  ]; hematuria[  ];   dysuria [  ];  nocturia[  ];  history of     obstruction [  ]; urinary frequency [  ]  Skin: rash, swelling[  ];, hair loss[  ];  peripheral edema[n  ];  or itching[  ]; Musculosketetal: myalgias[  ];  joint swelling[  ];  joint erythema[  ];  joint pain[  ];  back pain[  ];  Heme/Lymph: bruising[  ];  bleeding[  ];  anemia[  ];  Neuro: TIA[  ];  headaches[  ];  stroke[  ];  vertigo[  ];  seizures[  ];   paresthesias[  ];  difficulty walking[  ];  Psych:depression[  ]; anxiety[  ];  Endocrine: diabetes[  ];  thyroid dysfunction[  ];  Immunizations: Flu [  ]; Pneumococcal[  ];  Other:  Physical Exam: BP 94/62  Pulse 90   Resp 20  Ht 5\' 10"  (1.778 m)  Wt 134 lb (60.782 kg)  BMI 19.23 kg/m2  SpO2 94%  General appearance: alert and cooperative Neurologic: intact Heart: regular rate and rhythm, S1, S2 normal, no murmur, click, rub or gallop Lungs: clear to auscultation bilaterally Abdomen: soft, non-tender; bowel sounds normal; no masses,  no organomegaly Extremities: extremities normal, atraumatic, no cyanosis or edema Wound: I do not appreciate any cervical or supraclavicular adenopathy the patient's cervical neck incision is well-healed his abdominal and right thoracotomy incisions are also well healed.   Diagnostic Studies & Laboratory data:     Recent Radiology Findings:   Ct Chest W Contrast  07/02/2013   *RADIOLOGY REPORT*  Clinical Data: History of esophageal cancer with esophagectomy and gastric pull-through.  CT CHEST WITH CONTRAST  Technique:  Multidetector CT imaging of the chest was performed following the standard protocol during bolus administration of intravenous contrast.  Contrast: OMNIPAQUE IOHEXOL 300 MG/ML  SOLN  Comparison: 05/26/2012  Findings: The lungs are well-aerated bilaterally with emphysematous changes noted. A small right-sided pleural effusion is identified with some associated right lower lobe atelectasis.  This is new from prior exam.  No new focal infiltrate or sizable parenchymal nodule is seen.  There are changes consistent with the patient's given clinical history of esophagectomy and gastric pull-through.  An air and fluid is noted within the stomach.  No obstructive changes are seen.  The thoracic aorta and pulmonary artery are less than stable.  Mild dilatation of the proximal ascending aorta is again seen and stable.  Heavy coronary calcifications are again seen. No hilar or mediastinal adenopathy is identified.  Scanning into the upper abdomen reveals postsurgical changes.  IMPRESSION: Changes consistent with the known history of esophagectomy and gastric pull-through.   The overall appearance is stable.  No recurrent disease is seen.  Stable dilatation of the ascending aorta.  Stable emphysematous changes.   Original Report Authenticated By: Alcide Clever, M.D.   Ct Abdomen W Contrast  07/02/2013   *RADIOLOGY REPORT*  Clinical Data: Follow-up for the soft medial and lung cancer.  CT ABDOMEN WITH CONTRAST  Technique:  Multidetector CT imaging of the abdomen was performed following the standard protocol during bolus administration of intravenous contrast.  Contrast:  100 ml of Omnipaque-300  Comparison: May 26, 2012  CT of the chest:  Findings: There are extensive emphysematous changes of bilateral lungs.  Stable postsurgical changes are identified involving the left hemithorax.  There are no pulmonary nodules or mass.  There is interval developed minimal left pleural effusion and moderate right pleural effusion.  Along the medial aspect of the right pleural effusion, there is suggestion of pleural based 1 cm nodular enhancement best seen on series 3 image 46.  There  is no mediastinal or hilar lymphadenopathy.  The patient is status post prior gastric pull-through without interval change.  The heart size is normal.  There is no pericardial effusion.  Images of the bones demonstrate no focal discrete lytic or blastic lesion.  Impression:  Interval developed bilateral pleural effusions, right greater than left.  Along the medial aspect of the right pleural effusion, there is suggestion of a pleural based 1 cm nodule enhancement new since prior exam.  Metastasis is not excluded. Further evaluation with right thoracentesis with cytology may be helpful if clinically indicated.  CT of the abdomen:  Findings:  Images of the abdomen demonstrate normal liver.  The patient is status post prior cholecystectomy.  There is postsurgical common bile duct dilatation measuring 1.3 cm unchanged compared prior exam.  The spleen, pancreas, left adrenal gland are normal.  There are bilateral kidneys  cysts unchanged.  There is no hydronephrosis bilaterally.  There are stable postsurgical changes in the right retroperitoneum.  There are small stable small lymph nodes in the retroperitoneum periaortic region unchanged.  There is stable infrarenal abdominal aorta measuring 4.1 cm in diameter.  The visualized bowel is normal.  Images of the bones demonstrate no definite focal lytic or blastic lesions.  IMPRESSION: Stable abdomen CT without change compared prior exam.  No CT evidence of abdominal metastatic disease.   Original Report Authenticated By: Sherian Rein, M.D.    The patient's CT scans were read simultaneously by 2 different radiologists with conflicting reports in inconsistencies. I've talked to Dr. Kearney Hard who is reviewed the CT of the chest and abdomen, he will have the reports amended. On my interpretation The patient has a 4 cm abdominal aortic aneurysm mildly dilated ascending aorta 3.6 cm. There is a new right pleural effusion with some nodularity and enhancement suggestive of a malignant effusion.  Recent Lab Findings: Lab Results  Component Value Date   WBC 5.6 08/22/2012   HGB 13.1 08/22/2012   HCT 41.4 08/22/2012   PLT 232.0 08/22/2012   GLUCOSE 65* 08/22/2012   CHOL 197 08/22/2012   TRIG 81.0 08/22/2012   HDL 79.70 08/22/2012   LDLCALC 101* 08/22/2012   ALT 19 08/22/2012   AST 22 08/22/2012   NA 141 08/22/2012   K 5.3* 08/22/2012   CL 107 08/22/2012   CREATININE 0.99 06/22/2013   BUN 18 06/22/2013   CO2 28 08/22/2012   TSH 2.59 08/22/2012   INR 1.10 12/05/2011   HGBA1C  Value: 6.2 (NOTE) The ADA recommends the following therapeutic goal for glycemic control related to Hgb A1c measurement: Goal of therapy: <6.5 Hgb A1c  Reference: American Diabetes Association: Clinical Practice Recommendations 2010, Diabetes Care, 2010, 33: (Suppl  1).* 06/08/2009      Assessment / Plan:     Patient with history of adenocarcinoma of the esophagus resected, metastatic disease to a right adrenal, left  upper lobectomy for squamous cell carcinoma of the lung now with a suspicious new right lower effusion which is asymptomatic Incidental dilatation of the ascending aorta and 4 cm abdominal aortic aneurysm. Patient has a history of cerebral aneurysm repaired 1999, both his mother and grandmother died at an early age from cerebral aneurysms.  I discussed the CT findings with the patient and suggested that we proceed with a PET scan to restage for possible metastatic disease. After the scan is performed he will need depending on the findings a right thoracentesis for diagnostic reasons. The effusion is small and asymptomatic currently.  Delight Ovens MD      [address] [place] [place] [address] Office [phone]   Beeper [phone]  07/02/2013 1:47 PM

## 2013-07-07 ENCOUNTER — Encounter (HOSPITAL_COMMUNITY)
Admission: RE | Admit: 2013-07-07 | Discharge: 2013-07-07 | Disposition: A | Discharge status: Home / Self Care | Payer: Medicare Other | Source: Ambulatory Visit | Attending: Cardiothoracic Surgery | Admitting: Cardiothoracic Surgery

## 2013-07-07 ENCOUNTER — Ambulatory Visit (INDEPENDENT_AMBULATORY_CARE_PROVIDER_SITE_OTHER): Payer: Medicare Other | Admitting: Cardiothoracic Surgery

## 2013-07-07 ENCOUNTER — Other Ambulatory Visit: Payer: Self-pay

## 2013-07-07 ENCOUNTER — Encounter: Payer: Self-pay | Admitting: Cardiothoracic Surgery

## 2013-07-07 VITALS — BP 112/71 | HR 90 | Resp 16 | Ht 70.0 in | Wt 134.0 lb

## 2013-07-07 DIAGNOSIS — C3490 Malignant neoplasm of unspecified part of unspecified bronchus or lung: Secondary | ICD-10-CM | POA: Insufficient documentation

## 2013-07-07 DIAGNOSIS — J90 Pleural effusion, not elsewhere classified: Secondary | ICD-10-CM

## 2013-07-07 DIAGNOSIS — I714 Abdominal aortic aneurysm, without rupture, unspecified: Secondary | ICD-10-CM | POA: Insufficient documentation

## 2013-07-07 DIAGNOSIS — Z8501 Personal history of malignant neoplasm of esophagus: Secondary | ICD-10-CM

## 2013-07-07 DIAGNOSIS — Z85118 Personal history of other malignant neoplasm of bronchus and lung: Secondary | ICD-10-CM

## 2013-07-07 DIAGNOSIS — C159 Malignant neoplasm of esophagus, unspecified: Secondary | ICD-10-CM | POA: Insufficient documentation

## 2013-07-07 DIAGNOSIS — R222 Localized swelling, mass and lump, trunk: Secondary | ICD-10-CM

## 2013-07-07 LAB — GLUCOSE, CAPILLARY: Glucose-Capillary: 89 mg/dL (ref 70–99)

## 2013-07-07 MED ORDER — FLUDEOXYGLUCOSE F - 18 (FDG) INJECTION
15.1000 | Freq: Once | INTRAVENOUS | Status: AC | PRN
  Administered 2013-07-07: 15.1 via INTRAVENOUS

## 2013-07-07 NOTE — Progress Notes (Signed)
[address]       [place] [address]             [phone]                    [place] [facility] Medical Record #[id number] Date of Birth: 23-Jun-1941  Referring: Lorenda Peck, MD Primary Care: Lorenda Peck, MD  Chief Complaint:    Chief Complaint  Patient presents with  . Follow-up    discuss PET Scan results to assess right lower pleural effusion    History of Present Illness:    Patient is a 72 year old male who returns today with a followup CT scan ordered last year by Dr. [name]. He has a long history of multiple different cancers and surgical resection. 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 The patient later underwent removal of the right adrenal gland for metastatic disease. 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 The patient comes in today for a with PET sacn    Current Activity/ Functional Status:  Patient is independent with mobility/ambulation, transfers, ADL's, IADL's.  Zubrod Score: At the time of surgery this patient's most appropriate activity status/level should be described as: [x]  Normal activity, no symptoms []  Symptoms, fully ambulatory []  Symptoms, in bed less than or equal to 50% of the time []  Symptoms, in bed greater than 50% of the time but less than 100% []  Bedridden []  Moribund   Past Medical History  Diagnosis Date  . Dyslipidemia   . History of esophageal cancer     s/p transhiatal esophagogastrectomy  . Hypothyroidism   . Cerebral aneurysm     [place]. repair  1994  . History of lung cancer   . Pneumonia   . Cancer     Esophageal, adrenal gland, skin; lung  . Stroke 1995    denies residual  . 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    S/p right adrenalectomy 2005   . SBO (small bowel obstruction) 08/15/2012    History of small bowel obstruction (status post small bowel       resection, lysis of adhesions, incidental appendectomy, and repair       of left diaphragmatic hernia  . AAA (abdominal aortic aneurysm) 08/22/2012    3.9 cm by CT  - June 2013, stable     Past Surgical History  Procedure Laterality Date  . Left upper lobectomy with node dissection  02/24/2010    [place]  . Exploratory laparotomy, lysis of adhesions, reduce of incarcerated small bowel and colon from the chest, limited small bowel resection, incidental appendectomy, and then repair of diaphragmatic hernia with alloderm mesh  10/27/2009    [place]  . Left subclavian port- a-cath insertion  03/09/2004    [place]  . Exploratory laparotomy,exploratory thoracotomy for hemorrhage  02/11/2003    [place]  . Transhiatal esophagectomy with cholecystectomy, jejunostomy,  pyloroplasty and removal of right subclavian port-a- cath      [facility]  . Brain surgery      brain aneursyn  . Tonsillectomy    . Cholecystectomy      Family History  Problem Relation Age of Onset  . Aneurysm Mother   . Kidney disease Father   . Heart disease Maternal Uncle   . Heart  disease Paternal Uncle   . Aneurysm Maternal Grandmother     History   Social History  . Marital Status: Married    Spouse Name: N/A    Number of Children: N/A  . Years of Education: N/A   Occupational History  . retired    Social History Main Topics  . Smoking status: Former Smoker    Types: Cigarettes    Quit date: 12/10/2002  . Smokeless tobacco: Never Used  . Alcohol Use: No  . Drug Use: No  . Sexually Active: Not Currently   .   History  Smoking status  . Former Smoker  . Types: Cigarettes  . Quit date: 12/10/2002  Smokeless tobacco  . Never Used    History  Alcohol Use No     No Known Allergies  Current Outpatient Prescriptions  Medication Sig Dispense Refill  . aspirin 81 MG EC tablet Take 1 tablet  (81 mg total) by mouth daily. Swallow whole.  30 tablet  12  . levothyroxine (SYNTHROID) 50 MCG tablet Take 50 mcg by mouth daily.         No current facility-administered medications for this visit.       Review of Systems:     Cardiac Review of Systems: Y or N  Chest Pain [  n  ]  Resting SOB [ n  ] Exertional SOB  [n  ]  Orthopnea [n ]   Pedal Edema [ n  ]    Palpitations [ n ] Syncope  [n ]   Presyncope [n  ]  General Review of Systems: [Y] = yes [  ]=no Constitional: recent weight change [n  ]; anorexia [  ]; fatigue [n  ]; nausea [  ]; night sweats [  ]; fever [  ]; or chills [  ];                                                                                                                                          Dental: poor dentition[  ]; Last Dentist visit:   Eye : blurred vision [  ]; diplopia [   ]; vision changes [  ];  Amaurosis fugax[  ]; Resp: cough [  ];  wheezing[  ];  hemoptysis[  ]; shortness of breath[  ]; paroxysmal nocturnal dyspnea[  ]; dyspnea on exertion[  ]; or orthopnea[  ];  GI:  gallstones[  ], vomiting[  ];  dysphagia[  ]; melena[  ];  hematochezia [  ]; heartburn[  ];   Hx of  Colonoscopy[  ]; GU: kidney stones [  ]; hematuria[  ];   dysuria [  ];  nocturia[  ];  history of     obstruction [  ]; urinary frequency [  ]             Skin: rash, swelling[  ];, hair  loss[  ];  peripheral edema[n  ];  or itching[  ]; Musculosketetal: myalgias[  ];  joint swelling[  ];  joint erythema[  ];  joint pain[  ];  back pain[  ];  Heme/Lymph: bruising[  ];  bleeding[  ];  anemia[  ];  Neuro: TIA[  ];  headaches[  ];  stroke[  ];  vertigo[  ];  seizures[  ];   paresthesias[  ];  difficulty walking[  ];  Psych:depression[  ]; anxiety[  ];  Endocrine: diabetes[  ];  thyroid dysfunction[  ];  Immunizations: Flu [  ]; Pneumococcal[  ];  Other:  Physical Exam: BP 112/71  Pulse 90  Resp 16  Ht 5\' 10"  (1.778 m)  Wt 134 lb (60.782 kg)  BMI 19.23 kg/m2  SpO2  98%  General appearance: alert and cooperative Neurologic: intact Heart: regular rate and rhythm, S1, S2 normal, no murmur, click, rub or gallop Lungs: clear to auscultation bilaterally Abdomen: soft, non-tender; bowel sounds normal; no masses,  no organomegaly Extremities: extremities normal, atraumatic, no cyanosis or edema Wound: I do not appreciate any cervical or supraclavicular adenopathy the patient's cervical neck incision is well-healed his abdominal and right thoracotomy incisions are also well healed.   Diagnostic Studies & Laboratory data:     Recent Radiology Findings: Nm Pet Image Restag (ps) Skull Base To Thigh  07/07/2013   *RADIOLOGY REPORT*  Clinical Data: Subsequent treatment strategy for esophageal cancer and lung cancer.  NUCLEAR MEDICINE PET SKULL BASE TO THIGH  Fasting Blood Glucose:  89  Technique:  15.1 mCi F-18 FDG was injected intravenously. CT data was obtained and used for attenuation correction and anatomic localization only.  (This was not acquired as a diagnostic CT examination.) Additional exam technical data entered on technologist worksheet.  Comparison:  07/02/2013  Findings:  Neck: No hypermetabolic lymph nodes in the neck.  Chest:  Postoperative change from esophagectomy and gastric pull- through identified.  A left upper lobectomy has also been performed.  There has been interval decrease in volume of bilateral pleural effusions.  There is a small focus of mild increased FDG uptake localizing to the previously described loculated right pleural effusion.  The SUV max within this area is equal to 3.0, image 134.  No hypermetabolic mediastinal or hilar nodes.  No suspicious pulmonary nodules on the CT scan.  Abdomen/Pelvis:  No abnormal hypermetabolic activity within the liver, pancreas, adrenal glands, or spleen.  No hypermetabolic lymph nodes in the abdomen or pelvis. Again noted is an abdominal aortic aneurysm.  The maximum AP dimension is equal to 3.6, image  172/series 2.  Skeleton:  No focal hypermetabolic activity to suggest skeletal metastasis.  IMPRESSION:  1.  There is a small focus of mild increased uptake localizing to the partially loculated right pleural effusion.  This is nonspecific and may reflect inflammation or infection.  Malignant pleural effusion cannot be excluded.  Consider further evaluation with diagnostic thoracentesis. 2.  Infrarenal abdominal aortic aneurysm.   Original Report Authenticated By: Signa Kell, M.D.     Ct Chest W Contrast  07/02/2013   *RADIOLOGY REPORT*  Clinical Data: History of esophageal cancer with esophagectomy and gastric pull-through.  CT CHEST WITH CONTRAST  Technique:  Multidetector CT imaging of the chest was performed following the standard protocol during bolus administration of intravenous contrast.  Contrast: OMNIPAQUE IOHEXOL 300 MG/ML  SOLN  Comparison: 05/26/2012  Findings: The lungs are well-aerated bilaterally with emphysematous changes noted. A small  right-sided pleural effusion is identified with some associated right lower lobe atelectasis.  This is new from prior exam.  No new focal infiltrate or sizable parenchymal nodule is seen.  There are changes consistent with the patient's given clinical history of esophagectomy and gastric pull-through.  An air and fluid is noted within the stomach.  No obstructive changes are seen.  The thoracic aorta and pulmonary artery are less than stable.  Mild dilatation of the proximal ascending aorta is again seen and stable.  Heavy coronary calcifications are again seen. No hilar or mediastinal adenopathy is identified.  Scanning into the upper abdomen reveals postsurgical changes.  IMPRESSION: Changes consistent with the known history of esophagectomy and gastric pull-through.  The overall appearance is stable.  No recurrent disease is seen.  Stable dilatation of the ascending aorta.  Stable emphysematous changes.   Original Report Authenticated By: Alcide Clever,  M.D.   Ct Abdomen W Contrast  07/02/2013   *RADIOLOGY REPORT*  Clinical Data: Follow-up for the soft medial and lung cancer.  CT ABDOMEN WITH CONTRAST  Technique:  Multidetector CT imaging of the abdomen was performed following the standard protocol during bolus administration of intravenous contrast.  Contrast:  100 ml of Omnipaque-300  Comparison: May 26, 2012  CT of the chest:  Findings: There are extensive emphysematous changes of bilateral lungs.  Stable postsurgical changes are identified involving the left hemithorax.  There are no pulmonary nodules or mass.  There is interval developed minimal left pleural effusion and moderate right pleural effusion.  Along the medial aspect of the right pleural effusion, there is suggestion of pleural based 1 cm nodular enhancement best seen on series 3 image 46.  There is no mediastinal or hilar lymphadenopathy.  The patient is status post prior gastric pull-through without interval change.  The heart size is normal.  There is no pericardial effusion.  Images of the bones demonstrate no focal discrete lytic or blastic lesion.  Impression:  Interval developed bilateral pleural effusions, right greater than left.  Along the medial aspect of the right pleural effusion, there is suggestion of a pleural based 1 cm nodule enhancement new since prior exam.  Metastasis is not excluded. Further evaluation with right thoracentesis with cytology may be helpful if clinically indicated.  CT of the abdomen:  Findings:  Images of the abdomen demonstrate normal liver.  The patient is status post prior cholecystectomy.  There is postsurgical common bile duct dilatation measuring 1.3 cm unchanged compared prior exam.  The spleen, pancreas, left adrenal gland are normal.  There are bilateral kidneys cysts unchanged.  There is no hydronephrosis bilaterally.  There are stable postsurgical changes in the right retroperitoneum.  There are small stable small lymph nodes in the  retroperitoneum periaortic region unchanged.  There is stable infrarenal abdominal aorta measuring 4.1 cm in diameter.  The visualized bowel is normal.  Images of the bones demonstrate no definite focal lytic or blastic lesions.  IMPRESSION: Stable abdomen CT without change compared prior exam.  No CT evidence of abdominal metastatic disease.   Original Report Authenticated By: Sherian Rein, M.D.    The patient's CT scans were read simultaneously by 2 different radiologists with conflicting reports in inconsistencies. I've talked to Dr. Kearney Hard who is reviewed the CT of the chest and abdomen, he will have the reports amended. On my interpretation The patient has a 4 cm abdominal aortic aneurysm mildly dilated ascending aorta 3.6 cm. There is a new right pleural effusion with some  nodularity and enhancement suggestive of a malignant effusion.  Recent Lab Findings: Lab Results  Component Value Date   WBC 5.6 08/22/2012   HGB 13.1 08/22/2012   HCT 41.4 08/22/2012   PLT 232.0 08/22/2012   GLUCOSE 65* 08/22/2012   CHOL 197 08/22/2012   TRIG 81.0 08/22/2012   HDL 79.70 08/22/2012   LDLCALC 101* 08/22/2012   ALT 19 08/22/2012   AST 22 08/22/2012   NA 141 08/22/2012   K 5.3* 08/22/2012   CL 107 08/22/2012   CREATININE 0.99 06/22/2013   BUN 18 06/22/2013   CO2 28 08/22/2012   TSH 2.59 08/22/2012   INR 1.10 12/05/2011   HGBA1C  Value: 6.2 (NOTE) The ADA recommends the following therapeutic goal for glycemic control related to Hgb A1c measurement: Goal of therapy: <6.5 Hgb A1c  Reference: American Diabetes Association: Clinical Practice Recommendations 2010, Diabetes Care, 2010, 33: (Suppl  1).* 06/08/2009      Assessment / Plan:    Patient with history of adenocarcinoma of the esophagus resected, 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 Patient has a history of cerebral aneurysm repaired 1999, both his mother and grandmother died at an early age from cerebral aneurysms.  I discussed the CT findings with the patient and his wife. We have are reviewed the PET scan results which suggest possible malignant effusion.  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 I will see him back in the office following the completion of this.      Delight Ovens MD      [address] [place] [place] [address] Office [phone]   Beeper [phone]  07/07/2013 12:32 PM

## 2013-07-08 ENCOUNTER — Other Ambulatory Visit: Payer: Self-pay

## 2013-07-08 DIAGNOSIS — D491 Neoplasm of unspecified behavior of respiratory system: Secondary | ICD-10-CM

## 2013-07-09 ENCOUNTER — Ambulatory Visit (HOSPITAL_COMMUNITY): Payer: Medicare Other

## 2013-07-10 ENCOUNTER — Other Ambulatory Visit: Payer: Self-pay | Admitting: Radiology

## 2013-07-13 ENCOUNTER — Ambulatory Visit (HOSPITAL_COMMUNITY)
Admission: RE | Admit: 2013-07-13 | Discharge: 2013-07-13 | Disposition: A | Discharge status: Home / Self Care | Payer: Medicare Other | Source: Ambulatory Visit | Attending: Cardiothoracic Surgery | Admitting: Cardiothoracic Surgery

## 2013-07-13 ENCOUNTER — Ambulatory Visit (HOSPITAL_COMMUNITY)
Admission: RE | Admit: 2013-07-13 | Discharge: 2013-07-13 | Disposition: A | Discharge status: Home / Self Care | Payer: Medicare Other | Source: Ambulatory Visit | Attending: Interventional Radiology | Admitting: Interventional Radiology

## 2013-07-13 ENCOUNTER — Other Ambulatory Visit: Payer: Self-pay | Admitting: Cardiothoracic Surgery

## 2013-07-13 ENCOUNTER — Encounter (HOSPITAL_COMMUNITY): Payer: Self-pay

## 2013-07-13 DIAGNOSIS — J90 Pleural effusion, not elsewhere classified: Secondary | ICD-10-CM

## 2013-07-13 DIAGNOSIS — D491 Neoplasm of unspecified behavior of respiratory system: Secondary | ICD-10-CM

## 2013-07-13 DIAGNOSIS — Z85118 Personal history of other malignant neoplasm of bronchus and lung: Secondary | ICD-10-CM | POA: Insufficient documentation

## 2013-07-13 DIAGNOSIS — J449 Chronic obstructive pulmonary disease, unspecified: Secondary | ICD-10-CM | POA: Insufficient documentation

## 2013-07-13 DIAGNOSIS — J4489 Other specified chronic obstructive pulmonary disease: Secondary | ICD-10-CM | POA: Insufficient documentation

## 2013-07-13 LAB — CBC
HCT: 39.8 % (ref 39.0–52.0)
MCV: 91.9 fL (ref 78.0–100.0)
RDW: 16 % — ABNORMAL HIGH (ref 11.5–15.5)
WBC: 5.5 10*3/uL (ref 4.0–10.5)

## 2013-07-13 MED ORDER — FENTANYL CITRATE 0.05 MG/ML IJ SOLN
INTRAMUSCULAR | Status: AC
  Filled 2013-07-13: qty 4

## 2013-07-13 MED ORDER — SODIUM CHLORIDE 0.9 % IV SOLN: Freq: Once | INTRAVENOUS | Status: DC

## 2013-07-13 MED ORDER — MIDAZOLAM HCL 2 MG/2ML IJ SOLN
INTRAMUSCULAR | Status: AC
  Filled 2013-07-13: qty 4

## 2013-07-13 NOTE — Procedures (Signed)
Procedure:  Ultrasound guided right thoracentesis Findings:  CT shows no pleural mass.  Small effusion on right.  Ultrasound guided thoracentesis performed, yielding 230 mL of clear fluid.  Sent for cytologic analysis.  CXR pending.

## 2013-07-13 NOTE — H&P (Signed)
Agree 

## 2013-07-13 NOTE — H&P (Signed)
Referring Physician: Dr. Tyrone Sage HPI: [place] is an 72 y.o. male  with history of squamous cell lung carcinoma s/p left upper lobectomy. 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 Patient saw Dr. Tyrone Sage in office 07/07/13 and reviewed recent images. Images revealed new right pleural effusion and possible 1 cm pleural nodule on CT 07/02/13. PET scan on 07/07/13 demonstrates mild increased uptake localizing to partially loculated right pleural effusion. Patient denies any cough, hemoptysis, or recent weight loss. He denies any chest pain or shortness of breath. He denies any blood in his stool or urine.   Past Medical History:  Past Medical History  Diagnosis Date  . Dyslipidemia   . History of esophageal cancer     s/p transhiatal esophagogastrectomy  . Hypothyroidism   . Cerebral aneurysm     [place]. repair  1994  . History of lung cancer   . Pneumonia   . Cancer     Esophageal, adrenal gland, skin; lung  . Stroke 1995    denies residual  . 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 SBO (small bowel obstruction) 08/15/2012    History of small bowel obstruction (status post small bowel       resection, lysis of adhesions, incidental appendectomy, and repair       of left diaphragmatic hernia  . AAA (abdominal aortic aneurysm) 08/22/2012    3.9 cm by CT  - June 2013, stable     Past Surgical History:  Past Surgical History  Procedure Laterality Date  . Left upper lobectomy with node dissection  02/24/2010    [place]  . Exploratory laparotomy, lysis of adhesions, reduce of incarcerated small bowel and colon from the chest, limited small bowel resection, incidental appendectomy, and then repair of diaphragmatic hernia with alloderm mesh  10/27/2009    [place]  . Left subclavian port- a-cath insertion  03/09/2004    [place]  . Exploratory laparotomy,exploratory  thoracotomy for hemorrhage  02/11/2003    [place]  . Transhiatal esophagectomy with cholecystectomy, jejunostomy,  pyloroplasty and removal of right subclavian port-a- cath      [facility]  . Brain surgery      brain aneursyn  . Tonsillectomy    . Cholecystectomy      Family History:  Family History  Problem Relation Age of Onset  . Aneurysm Mother   . Kidney disease Father   . Heart disease Maternal Uncle   . Heart disease Paternal Uncle   . Aneurysm Maternal Grandmother     Social History:  reports that he quit smoking about 10 years ago. His smoking use included Cigarettes. He smoked 0.00 packs per day. He has never used smokeless tobacco. He reports that he does not drink alcohol or use illicit drugs.  Allergies: No Known Allergies     Medication List    ASK your doctor about these medications       aspirin 81 MG EC tablet  Take 1 tablet (81 mg total) by mouth daily. Swallow whole.     SYNTHROID 50 MCG tablet  Generic drug:  levothyroxine  Take 50 mcg by mouth daily.        Please HPI for pertinent positives, otherwise complete 10 system ROS negative.  Physical Exam: BP 115/82  Pulse 72  Temp(Src) 97.3 F (36.3 C) (Oral)  Resp 18  Ht 5\' 10"  (1.778 m)  Wt 134 lb (60.782 kg)  BMI 19.23 kg/m2  SpO2 100% Body mass index is 19.23 kg/(m^2).   General Appearance:  Alert, cooperative, no distress, appears stated age  Head:  Normocephalic, without obvious abnormality, atraumatic  Lungs:   Clear to auscultation bilaterally, no w/r/r, respirations unlabored without use of accessory muscles.  Chest Wall:  No tenderness or deformity  Heart:  Regular rate and rhythm, S1, S2 normal, no murmur, rub or gallop.  Abdomen:   Soft, non-tender, non distended.  Extremities: Extremities normal, atraumatic, no cyanosis or edema  Pulses: 2+ and symmetric  Neurologic: Normal affect, no gross deficits.   Results for orders placed during the hospital encounter of 07/13/13 (from the  past 48 hour(s))  CBC     Status: Abnormal   Collection Time    07/13/13  7:36 AM      Result Value Range   WBC 5.5  4.0 - 10.5 K/uL   RBC 4.33  4.22 - 5.81 MIL/uL   Hemoglobin 13.6  13.0 - 17.0 g/dL   HCT 16.1  09.6 - 04.5 %   MCV 91.9  78.0 - 100.0 fL   MCH 31.4  26.0 - 34.0 pg   MCHC 34.2  30.0 - 36.0 g/dL   RDW 40.9 (*) 81.1 - 91.4 %   Platelets 244  150 - 400 K/uL   No results found.  Assessment/Plan History of squamous cell lung carcinoma s/p Left Upper lobectomy. 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  Patient with new right pleural effusion and possible 1 cm pleural nodule on CT 07/02/13. PET scan on 07/07/13 demonstrates mild increased uptake localizing to partially loculated right pleural effusion.  Patient here today for CT guided right thoracentesis for diagnostic testing and possible right pleural nodule biopsy. Risks and Benefits discussed with the patient and his wife. All of the patient's questions were answered, patient is agreeable to proceed. Consent signed and in chart. Labs reviewed.   Pattricia Boss D PA-C 07/13/2013, 8:06 AM    \

## 2013-07-16 ENCOUNTER — Encounter: Payer: Medicare Other | Admitting: Cardiothoracic Surgery

## 2013-07-16 ENCOUNTER — Ambulatory Visit (INDEPENDENT_AMBULATORY_CARE_PROVIDER_SITE_OTHER): Payer: Medicare Other | Admitting: Cardiothoracic Surgery

## 2013-07-16 ENCOUNTER — Encounter: Payer: Self-pay | Admitting: Cardiothoracic Surgery

## 2013-07-16 VITALS — BP 104/71 | HR 72 | Resp 18 | Ht 70.0 in | Wt 134.0 lb

## 2013-07-16 DIAGNOSIS — J90 Pleural effusion, not elsewhere classified: Secondary | ICD-10-CM

## 2013-07-16 DIAGNOSIS — Z902 Acquired absence of lung [part of]: Secondary | ICD-10-CM

## 2013-07-16 DIAGNOSIS — D491 Neoplasm of unspecified behavior of respiratory system: Secondary | ICD-10-CM

## 2013-07-16 DIAGNOSIS — Z85118 Personal history of other malignant neoplasm of bronchus and lung: Secondary | ICD-10-CM

## 2013-07-16 DIAGNOSIS — Z9889 Other specified postprocedural states: Secondary | ICD-10-CM

## 2013-07-16 NOTE — Progress Notes (Signed)
[address]       [place] [address]             [phone]                    JAIQUAN TEMME [facility] Medical Record #[id number] Date of Birth: 07-Sep-1941  Referring: Lorenda Peck, MD Primary Care: Lorenda Peck, MD  Chief Complaint:    Chief Complaint  Patient presents with  . Follow-up    S/P [place] right guided Thoracentesis on 07/13/13    History of Present Illness:    Patient is a 72 year old male who returns today with a followup CT scan ordered last year by Dr. [name]. He has a long history of multiple different cancers and surgical resection. 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 The patient later underwent removal of the right adrenal gland for metastatic disease. 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  Patient returns today after ct guided needle aspiration of rt pleural effusion/pleural mass  Current Activity/ Functional Status:  Patient is independent with mobility/ambulation, transfers, ADL's, IADL's.  Zubrod Score: At the time of surgery this patient's most appropriate activity status/level should be described as: [x]  Normal activity, no symptoms []  Symptoms, fully ambulatory []  Symptoms, in bed less than or equal to 50% of the time []  Symptoms, in bed greater than 50% of the time but less than 100% []  Bedridden []  Moribund   Past Medical History  Diagnosis Date  . Dyslipidemia   . History of esophageal cancer     s/p transhiatal esophagogastrectomy  . Hypothyroidism   . Cerebral aneurysm     TX. repair  1994  . History of lung cancer   . Pneumonia   . Cancer     Esophageal, adrenal gland, skin; lung  . Stroke 1995    denies residual  . 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    S/p  right adrenalectomy 2005  . SBO (small bowel obstruction) 08/15/2012    History of small bowel obstruction (status post small bowel       resection, lysis of adhesions, incidental appendectomy, and repair       of left diaphragmatic hernia  . AAA (abdominal aortic aneurysm) 08/22/2012    3.9 cm by CT  - June 2013, stable     Past Surgical History  Procedure Laterality Date  . Left upper lobectomy with node dissection  02/24/2010    [place]  . Exploratory laparotomy, lysis of adhesions, reduce of incarcerated small bowel and colon from the chest, limited small bowel resection, incidental appendectomy, and then repair of diaphragmatic hernia with alloderm mesh  10/27/2009    [place]  . Left subclavian port- a-cath insertion  03/09/2004    [place]  . Exploratory laparotomy,exploratory thoracotomy for hemorrhage  02/11/2003    [place]  . Transhiatal esophagectomy with cholecystectomy, jejunostomy,  pyloroplasty and removal of right subclavian port-a- cath      [facility]  . Brain surgery      brain aneursyn  . Tonsillectomy    . Cholecystectomy      Family History  Problem Relation Age of Onset  . Aneurysm Mother   . Kidney disease Father   . Heart disease Maternal Uncle   . Heart disease  Paternal Uncle   . Aneurysm Maternal Grandmother     History   Social History  . Marital Status: Married    Spouse Name: N/A    Number of Children: N/A  . Years of Education: N/A   Occupational History  . retired    Social History Main Topics  . Smoking status: Former Smoker    Types: Cigarettes    Quit date: 12/10/2002  . Smokeless tobacco: Never Used  . Alcohol Use: No  . Drug Use: No  . Sexually Active: Not Currently   .   History  Smoking status  . Former Smoker  . Types: Cigarettes  . Quit date: 12/10/2002  Smokeless tobacco  . Never Used    History  Alcohol Use No     No Known Allergies  Current Outpatient Prescriptions  Medication Sig Dispense Refill  . aspirin 81 MG  EC tablet Take 1 tablet (81 mg total) by mouth daily. Swallow whole.  30 tablet  12  . levothyroxine (SYNTHROID) 50 MCG tablet Take 50 mcg by mouth daily.         No current facility-administered medications for this visit.       Review of Systems:     Cardiac Review of Systems: Y or N  Chest Pain [  n  ]  Resting SOB [ n  ] Exertional SOB  [n  ]  Orthopnea [n ]   Pedal Edema [ n  ]    Palpitations [ n ] Syncope  [n ]   Presyncope [n  ]  General Review of Systems: [Y] = yes [  ]=no Constitional: recent weight change [n  ]; anorexia [  ]; fatigue [n  ]; nausea [  ]; night sweats [  ]; fever [  ]; or chills [  ];                                                                                                                                          Dental: poor dentition[  ]; Last Dentist visit:   Eye : blurred vision [  ]; diplopia [   ]; vision changes [  ];  Amaurosis fugax[  ]; Resp: cough [  ];  wheezing[  ];  hemoptysis[  ]; shortness of breath[  ]; paroxysmal nocturnal dyspnea[  ]; dyspnea on exertion[  ]; or orthopnea[  ];  GI:  gallstones[  ], vomiting[  ];  dysphagia[  ]; melena[  ];  hematochezia [  ]; heartburn[  ];   Hx of  Colonoscopy[  ]; GU: kidney stones [  ]; hematuria[  ];   dysuria [  ];  nocturia[  ];  history of     obstruction [  ]; urinary frequency [  ]             Skin: rash, swelling[  ];, hair loss[  ];  peripheral edema[n  ];  or itching[  ]; Musculosketetal: myalgias[  ];  joint swelling[  ];  joint erythema[  ];  joint pain[  ];  back pain[  ];  Heme/Lymph: bruising[  ];  bleeding[  ];  anemia[  ];  Neuro: TIA[  ];  headaches[  ];  stroke[  ];  vertigo[  ];  seizures[  ];   paresthesias[  ];  difficulty walking[  ];  Psych:depression[  ]; anxiety[  ];  Endocrine: diabetes[  ];  thyroid dysfunction[  ];  Immunizations: Flu [  ]; Pneumococcal[  ];  Other:  Physical Exam: BP 104/71  Pulse 72  Resp 18  Ht 5\' 10"  (1.778 m)  Wt 134 lb (60.782 kg)  BMI 19.23  kg/m2  SpO2 95%  General appearance: alert and cooperative Neurologic: intact Heart: regular rate and rhythm, S1, S2 normal, no murmur, click, rub or gallop Lungs: clear to auscultation bilaterally Abdomen: soft, non-tender; bowel sounds normal; no masses,  no organomegaly, easily palpable 4 cm AAA non tender Extremities: extremities normal, atraumatic, no cyanosis or edema Wound: I do not appreciate any cervical or supraclavicular adenopathy the patient's cervical neck incision is well-healed his abdominal and right thoracotomy incisions are also well healed.   Diagnostic Studies & Laboratory data:     Recent Radiology Findings: Dg Chest 1 View  07/13/2013   *RADIOLOGY REPORT*  Clinical Data: Post right thoracentesis  CHEST - 1 VIEW  Comparison: 12/05/2011 chest radiograph, PET CT 07/07/2013  Findings: A bulla at the right base is incidentally re-identified. Mild fluid tracking along the right minor fissure is noted.  Small left effusion is present.  No pneumothorax.  Left apical clips are present.  Heart size is normal.  Right upper quadrant clips. Emphysematous changes and patchy ill-defined predominately right lower lobe airspace opacity is identified.  IMPRESSION: No pneumothorax after thoracentesis.   Original Report Authenticated By: Christiana Pellant, M.D.   Ct Chest Wo Contrast  07/13/2013   *RADIOLOGY REPORT*  Clinical Data: History of lung carcinoma with right pleural effusion and abnormal pleural enhancement by PET scan.  Evaluation is performed for possible pleural biopsy.  CT CHEST WITHOUT CONTRAST  Technique:  Multidetector CT imaging of the chest was performed following the standard protocol without IV contrast.  Comparison: PET scan on 07/07/2013  Findings: Imaging was performed in a prone position. There is no evidence of right-sided pleural mass.  A small pleural effusion is present which layers dependently in the anterior hemithorax when the patient is in a prone position.   Significant COPD present.  No pulmonary nodules are identified.  IMPRESSION: No evidence of pleural mass.  A small layering right-sided pleural effusion is present. An ultrasound-guided right-sided thoracentesis was subsequently performed.   Original Report Authenticated By: Irish Lack, M.D.   Nm Pet Image Restag (ps) Skull Base To Thigh  07/07/2013   *RADIOLOGY REPORT*  Clinical Data: Subsequent treatment strategy for esophageal cancer and lung cancer.  NUCLEAR MEDICINE PET SKULL BASE TO THIGH  Fasting Blood Glucose:  89  Technique:  15.1 mCi F-18 FDG was injected intravenously. CT data was obtained and used for attenuation correction and anatomic localization only.  (This was not acquired as a diagnostic CT examination.) Additional exam technical data entered on technologist worksheet.  Comparison:  07/02/2013  Findings:  Neck: No hypermetabolic lymph nodes in the neck.  Chest:  Postoperative change from esophagectomy and gastric pull- through identified.  A left upper lobectomy has also  been performed.  There has been interval decrease in volume of bilateral pleural effusions.  There is a small focus of mild increased FDG uptake localizing to the previously described loculated right pleural effusion.  The SUV max within this area is equal to 3.0, image 134.  No hypermetabolic mediastinal or hilar nodes.  No suspicious pulmonary nodules on the CT scan.  Abdomen/Pelvis:  No abnormal hypermetabolic activity within the liver, pancreas, adrenal glands, or spleen.  No hypermetabolic lymph nodes in the abdomen or pelvis. Again noted is an abdominal aortic aneurysm.  The maximum AP dimension is equal to 3.6, image 172/series 2.  Skeleton:  No focal hypermetabolic activity to suggest skeletal metastasis.  IMPRESSION:  1.  There is a small focus of mild increased uptake localizing to the partially loculated right pleural effusion.  This is nonspecific and may reflect inflammation or infection.  Malignant pleural  effusion cannot be excluded.  Consider further evaluation with diagnostic thoracentesis. 2.  Infrarenal abdominal aortic aneurysm.   Original Report Authenticated By: Signa Kell, M.D.     Ct Chest W Contrast  07/02/2013   *RADIOLOGY REPORT*  Clinical Data: History of esophageal cancer with esophagectomy and gastric pull-through.  CT CHEST WITH CONTRAST  Technique:  Multidetector CT imaging of the chest was performed following the standard protocol during bolus administration of intravenous contrast.  Contrast: OMNIPAQUE IOHEXOL 300 MG/ML  SOLN  Comparison: 05/26/2012  Findings: The lungs are well-aerated bilaterally with emphysematous changes noted. A small right-sided pleural effusion is identified with some associated right lower lobe atelectasis.  This is new from prior exam.  No new focal infiltrate or sizable parenchymal nodule is seen.  There are changes consistent with the patient's given clinical history of esophagectomy and gastric pull-through.  An air and fluid is noted within the stomach.  No obstructive changes are seen.  The thoracic aorta and pulmonary artery are less than stable.  Mild dilatation of the proximal ascending aorta is again seen and stable.  Heavy coronary calcifications are again seen. No hilar or mediastinal adenopathy is identified.  Scanning into the upper abdomen reveals postsurgical changes.  IMPRESSION: Changes consistent with the known history of esophagectomy and gastric pull-through.  The overall appearance is stable.  No recurrent disease is seen.  Stable dilatation of the ascending aorta.  Stable emphysematous changes.   Original Report Authenticated By: Alcide Clever, M.D.   Ct Abdomen W Contrast  07/02/2013   *RADIOLOGY REPORT*  Clinical Data: Follow-up for the soft medial and lung cancer.  CT ABDOMEN WITH CONTRAST  Technique:  Multidetector CT imaging of the abdomen was performed following the standard protocol during bolus administration of intravenous  contrast.  Contrast:  100 ml of Omnipaque-300  Comparison: May 26, 2012  CT of the chest:  Findings: There are extensive emphysematous changes of bilateral lungs.  Stable postsurgical changes are identified involving the left hemithorax.  There are no pulmonary nodules or mass.  There is interval developed minimal left pleural effusion and moderate right pleural effusion.  Along the medial aspect of the right pleural effusion, there is suggestion of pleural based 1 cm nodular enhancement best seen on series 3 image 46.  There is no mediastinal or hilar lymphadenopathy.  The patient is status post prior gastric pull-through without interval change.  The heart size is normal.  There is no pericardial effusion.  Images of the bones demonstrate no focal discrete lytic or blastic lesion.  Impression:  Interval developed bilateral pleural effusions,  right greater than left.  Along the medial aspect of the right pleural effusion, there is suggestion of a pleural based 1 cm nodule enhancement new since prior exam.  Metastasis is not excluded. Further evaluation with right thoracentesis with cytology may be helpful if clinically indicated.  CT of the abdomen:  Findings:  Images of the abdomen demonstrate normal liver.  The patient is status post prior cholecystectomy.  There is postsurgical common bile duct dilatation measuring 1.3 cm unchanged compared prior exam.  The spleen, pancreas, left adrenal gland are normal.  There are bilateral kidneys cysts unchanged.  There is no hydronephrosis bilaterally.  There are stable postsurgical changes in the right retroperitoneum.  There are small stable small lymph nodes in the retroperitoneum periaortic region unchanged.  There is stable infrarenal abdominal aorta measuring 4.1 cm in diameter.  The visualized bowel is normal.  Images of the bones demonstrate no definite focal lytic or blastic lesions.  IMPRESSION: Stable abdomen CT without change compared prior exam.  No CT  evidence of abdominal metastatic disease.   Original Report Authenticated By: Sherian Rein, M.D.    The patient's CT scans were read simultaneously by 2 different radiologists with conflicting reports in inconsistencies. I've talked to Dr. Kearney Hard who is reviewed the CT of the chest and abdomen, he will have the reports amended. On my interpretation The patient has a 4 cm abdominal aortic aneurysm mildly dilated ascending aorta 3.6 cm. There is a new right pleural effusion with some nodularity and enhancement suggestive of a malignant effusion.  Recent Lab Findings: Lab Results  Component Value Date   WBC 5.5 07/13/2013   HGB 13.6 07/13/2013   HCT 39.8 07/13/2013   PLT 244 07/13/2013   GLUCOSE 65* 08/22/2012   CHOL 197 08/22/2012   TRIG 81.0 08/22/2012   HDL 79.70 08/22/2012   LDLCALC 101* 08/22/2012   ALT 19 08/22/2012   AST 22 08/22/2012   NA 141 08/22/2012   K 5.3* 08/22/2012   CL 107 08/22/2012   CREATININE 0.99 06/22/2013   BUN 18 06/22/2013   CO2 28 08/22/2012   TSH 2.59 08/22/2012   INR 0.99 07/13/2013   HGBA1C  Value: 6.2 (NOTE) The ADA recommends the following therapeutic goal for glycemic control related to Hgb A1c measurement: Goal of therapy: <6.5 Hgb A1c  Reference: American Diabetes Association: Clinical Practice Recommendations 2010, Diabetes Care, 2010, 33: (Suppl  1).* 06/08/2009    Diagnosis PLEURAL FLUID, RIGHT NO MALIGNANT CELLS IDENTIFIED. [place] RUND DO Pathologist, Electronic Signature (Case signed 07/14/2013) Specimen Clinical Information History of lung carcinoma, Right pleural effusion Source Pleural Fluid, Right Gross Specimen: Received is/are 3 syringes totaling 180cc's of goldfluid.(GW:gw)  Assessment / Plan:   Patient with history of adenocarcinoma of the esophagus resected, metastatic disease to a right adrenal, left upper lobectomy for squamous cell carcinoma of the lung now with a  new right lower pleural effusion which is asymptomatic, Ct guided needle bx shows no  malignant cells but discussed with patient need for close follow up. Follow ct of the chest 5 months  Incidental finding dilatation of the ascending aorta 3.6 and 4 cm abdominal aortic aneurysm. I've referred the patient to Dr. Myra Gianotti for evaluation and following of his abdominal aortic aneurysm.   Patient has a history of cerebral aneurysm repaired 1999, both his mother and grandmother died at an early age from cerebral aneurysms.     Delight Ovens MD      [address] [place]  [place] [address] Office [phone]   Beeper [phone]  07/16/2013 10:28 AM

## 2013-07-30 ENCOUNTER — Encounter (HOSPITAL_COMMUNITY): Payer: Self-pay | Admitting: Anesthesiology

## 2013-07-30 ENCOUNTER — Emergency Department (HOSPITAL_COMMUNITY): Payer: Medicare Other

## 2013-07-30 ENCOUNTER — Inpatient Hospital Stay (HOSPITAL_COMMUNITY): Payer: Medicare Other | Admitting: Anesthesiology

## 2013-07-30 ENCOUNTER — Encounter (HOSPITAL_COMMUNITY): Payer: Self-pay | Admitting: Emergency Medicine

## 2013-07-30 ENCOUNTER — Inpatient Hospital Stay (HOSPITAL_COMMUNITY)
Admission: EM | Admit: 2013-07-30 | Discharge: 2013-08-11 | DRG: 233 | Disposition: A | Discharge status: Home / Self Care | Payer: Medicare Other | Attending: Surgery | Admitting: Surgery

## 2013-07-30 ENCOUNTER — Encounter (HOSPITAL_COMMUNITY)
Admission: EM | Disposition: A | Discharge status: Home / Self Care | Payer: Self-pay | Source: Home / Self Care | Attending: Surgery

## 2013-07-30 DIAGNOSIS — I2510 Atherosclerotic heart disease of native coronary artery without angina pectoris: Secondary | ICD-10-CM | POA: Diagnosis present

## 2013-07-30 DIAGNOSIS — J95812 Postprocedural air leak: Secondary | ICD-10-CM | POA: Diagnosis not present

## 2013-07-30 DIAGNOSIS — Z85118 Personal history of other malignant neoplasm of bronchus and lung: Secondary | ICD-10-CM

## 2013-07-30 DIAGNOSIS — Z8501 Personal history of malignant neoplasm of esophagus: Secondary | ICD-10-CM

## 2013-07-30 DIAGNOSIS — D62 Acute posthemorrhagic anemia: Secondary | ICD-10-CM | POA: Diagnosis not present

## 2013-07-30 DIAGNOSIS — Z9221 Personal history of antineoplastic chemotherapy: Secondary | ICD-10-CM

## 2013-07-30 DIAGNOSIS — Z923 Personal history of irradiation: Secondary | ICD-10-CM

## 2013-07-30 DIAGNOSIS — I2789 Other specified pulmonary heart diseases: Secondary | ICD-10-CM | POA: Diagnosis present

## 2013-07-30 DIAGNOSIS — R570 Cardiogenic shock: Secondary | ICD-10-CM

## 2013-07-30 DIAGNOSIS — Z87891 Personal history of nicotine dependence: Secondary | ICD-10-CM

## 2013-07-30 DIAGNOSIS — I059 Rheumatic mitral valve disease, unspecified: Secondary | ICD-10-CM | POA: Diagnosis present

## 2013-07-30 DIAGNOSIS — J4489 Other specified chronic obstructive pulmonary disease: Secondary | ICD-10-CM | POA: Diagnosis present

## 2013-07-30 DIAGNOSIS — Z9089 Acquired absence of other organs: Secondary | ICD-10-CM

## 2013-07-30 DIAGNOSIS — Z8673 Personal history of transient ischemic attack (TIA), and cerebral infarction without residual deficits: Secondary | ICD-10-CM

## 2013-07-30 DIAGNOSIS — R0601 Orthopnea: Secondary | ICD-10-CM | POA: Diagnosis present

## 2013-07-30 DIAGNOSIS — Z902 Acquired absence of lung [part of]: Secondary | ICD-10-CM

## 2013-07-30 DIAGNOSIS — I214 Non-ST elevation (NSTEMI) myocardial infarction: Principal | ICD-10-CM | POA: Diagnosis present

## 2013-07-30 DIAGNOSIS — Z951 Presence of aortocoronary bypass graft: Secondary | ICD-10-CM

## 2013-07-30 DIAGNOSIS — J9819 Other pulmonary collapse: Secondary | ICD-10-CM | POA: Diagnosis not present

## 2013-07-30 DIAGNOSIS — I739 Peripheral vascular disease, unspecified: Secondary | ICD-10-CM | POA: Diagnosis present

## 2013-07-30 DIAGNOSIS — I501 Left ventricular failure: Secondary | ICD-10-CM

## 2013-07-30 DIAGNOSIS — Z8249 Family history of ischemic heart disease and other diseases of the circulatory system: Secondary | ICD-10-CM

## 2013-07-30 DIAGNOSIS — Z841 Family history of disorders of kidney and ureter: Secondary | ICD-10-CM

## 2013-07-30 DIAGNOSIS — J449 Chronic obstructive pulmonary disease, unspecified: Secondary | ICD-10-CM | POA: Diagnosis present

## 2013-07-30 DIAGNOSIS — I509 Heart failure, unspecified: Secondary | ICD-10-CM | POA: Diagnosis present

## 2013-07-30 DIAGNOSIS — I498 Other specified cardiac arrhythmias: Secondary | ICD-10-CM | POA: Diagnosis not present

## 2013-07-30 DIAGNOSIS — I4460 Unspecified fascicular block: Secondary | ICD-10-CM | POA: Diagnosis present

## 2013-07-30 DIAGNOSIS — R531 Weakness: Secondary | ICD-10-CM | POA: Diagnosis present

## 2013-07-30 DIAGNOSIS — E785 Hyperlipidemia, unspecified: Secondary | ICD-10-CM | POA: Diagnosis present

## 2013-07-30 DIAGNOSIS — I714 Abdominal aortic aneurysm, without rupture, unspecified: Secondary | ICD-10-CM | POA: Diagnosis present

## 2013-07-30 DIAGNOSIS — I2589 Other forms of chronic ischemic heart disease: Secondary | ICD-10-CM | POA: Diagnosis present

## 2013-07-30 DIAGNOSIS — E039 Hypothyroidism, unspecified: Secondary | ICD-10-CM | POA: Diagnosis present

## 2013-07-30 DIAGNOSIS — I5021 Acute systolic (congestive) heart failure: Secondary | ICD-10-CM | POA: Diagnosis present

## 2013-07-30 DIAGNOSIS — I4891 Unspecified atrial fibrillation: Secondary | ICD-10-CM | POA: Diagnosis not present

## 2013-07-30 HISTORY — PX: LEFT HEART CATHETERIZATION WITH CORONARY ANGIOGRAM: SHX5451

## 2013-07-30 HISTORY — PX: CORONARY ARTERY BYPASS GRAFT: SHX141

## 2013-07-30 LAB — COMPREHENSIVE METABOLIC PANEL
ALT: 74 U/L — ABNORMAL HIGH (ref 0–53)
ALT: 56 U/L — ABNORMAL HIGH (ref 0–53)
AST: 127 U/L — ABNORMAL HIGH (ref 0–37)
Albumin: 3.6 g/dL (ref 3.5–5.2)
Albumin: 2.9 g/dL — ABNORMAL LOW (ref 3.5–5.2)
Alkaline Phosphatase: 155 U/L — ABNORMAL HIGH (ref 39–117)
CO2: 21 mEq/L (ref 19–32)
Calcium: 9.7 mg/dL (ref 8.4–10.5)
Chloride: 105 mEq/L (ref 96–112)
Creatinine, Ser: 0.81 mg/dL (ref 0.50–1.35)
GFR calc non Af Amer: 87 mL/min — ABNORMAL LOW (ref >90)
Potassium: 4.5 mEq/L (ref 3.5–5.1)
Sodium: 141 mEq/L (ref 135–145)
Sodium: 137 mEq/L (ref 135–145)
Total Bilirubin: 0.7 mg/dL (ref 0.3–1.2)
Total Protein: 7.3 g/dL (ref 6.0–8.3)

## 2013-07-30 LAB — TROPONIN I: Troponin I: 17.75 ng/mL (ref <0.30)

## 2013-07-30 LAB — LIPID PANEL
Cholesterol: 136 mg/dL (ref 0–200)
HDL: 58 mg/dL (ref >39)
Total CHOL/HDL Ratio: 2.3 RATIO
VLDL: 11 mg/dL (ref 0–40)

## 2013-07-30 LAB — CBC
MCV: 89.8 fL (ref 78.0–100.0)
Platelets: 181 10*3/uL (ref 150–400)
RBC: 3.91 MIL/uL — ABNORMAL LOW (ref 4.22–5.81)
RDW: 16 % — ABNORMAL HIGH (ref 11.5–15.5)
WBC: 9.3 10*3/uL (ref 4.0–10.5)

## 2013-07-30 LAB — POCT I-STAT 3, ART BLOOD GAS (G3+)
Acid-base deficit: 4 mmol/L — ABNORMAL HIGH (ref 0.0–2.0)
O2 Saturation: 99 %
TCO2: 22 mmol/L (ref 0–100)

## 2013-07-30 LAB — CBC WITH DIFFERENTIAL/PLATELET
Basophils Absolute: 0 10*3/uL (ref 0.0–0.1)
Basophils Relative: 0 % (ref 0–1)
Eosinophils Absolute: 0.1 10*3/uL (ref 0.0–0.7)
Eosinophils Relative: 2 % (ref 0–5)
MCH: 31.3 pg (ref 26.0–34.0)
MCHC: 35 g/dL (ref 30.0–36.0)
Neutrophils Relative %: 65 % (ref 43–77)
Platelets: 204 10*3/uL (ref 150–400)
RBC: 4.44 MIL/uL (ref 4.22–5.81)
RDW: 16 % — ABNORMAL HIGH (ref 11.5–15.5)

## 2013-07-30 LAB — PROTIME-INR
INR: 1.13 (ref 0.00–1.49)
Prothrombin Time: 16 seconds — ABNORMAL HIGH (ref 11.6–15.2)

## 2013-07-30 LAB — CK TOTAL AND CKMB (NOT AT ARMC)
CK, MB: 91.2 ng/mL (ref 0.3–4.0)
CK, MB: 70 ng/mL (ref 0.3–4.0)
Relative Index: 9.5 — ABNORMAL HIGH (ref 0.0–2.5)
Relative Index: 8.8 — ABNORMAL HIGH (ref 0.0–2.5)
Total CK: 965 U/L — ABNORMAL HIGH (ref 7–232)

## 2013-07-30 LAB — CG4 I-STAT (LACTIC ACID): Lactic Acid, Venous: 1.95 mmol/L (ref 0.5–2.2)

## 2013-07-30 LAB — URINALYSIS, ROUTINE W REFLEX MICROSCOPIC
Glucose, UA: NEGATIVE mg/dL
Hgb urine dipstick: NEGATIVE
Leukocytes, UA: NEGATIVE
Protein, ur: NEGATIVE mg/dL
Specific Gravity, Urine: 1.028 (ref 1.005–1.030)
Urobilinogen, UA: 1 mg/dL (ref 0.0–1.0)

## 2013-07-30 LAB — HEMOGLOBIN AND HEMATOCRIT, BLOOD: HCT: 24.6 % — ABNORMAL LOW (ref 39.0–52.0)

## 2013-07-30 LAB — PLATELET COUNT: Platelets: 106 10*3/uL — ABNORMAL LOW (ref 150–400)

## 2013-07-30 SURGERY — CORONARY ARTERY BYPASS GRAFTING (CABG)
Anesthesia: General | Site: Chest | Wound class: Clean

## 2013-07-30 SURGERY — LEFT HEART CATHETERIZATION WITH CORONARY ANGIOGRAM
Anesthesia: LOCAL

## 2013-07-30 MED ORDER — PLASMA-LYTE 148 IV SOLN
INTRAVENOUS | Status: AC
  Administered 2013-07-30: 21:00:00
  Filled 2013-07-30: qty 2.5

## 2013-07-30 MED ORDER — LEVOFLOXACIN IN D5W 500 MG/100ML IV SOLN
500.0000 mg | Freq: Once | INTRAVENOUS | Status: AC
  Administered 2013-07-30: 500 mg via INTRAVENOUS
  Filled 2013-07-30: qty 100

## 2013-07-30 MED ORDER — ASPIRIN 81 MG PO CHEW
CHEWABLE_TABLET | ORAL | Status: AC
  Administered 2013-07-30: 324 mg via ORAL
  Filled 2013-07-30: qty 4

## 2013-07-30 MED ORDER — DIAZEPAM 5 MG PO TABS
ORAL_TABLET | ORAL | Status: AC
  Administered 2013-07-30: 5 mg via ORAL
  Filled 2013-07-30: qty 1

## 2013-07-30 MED ORDER — LIDOCAINE HCL (CARDIAC) 20 MG/ML IV SOLN: INTRAVENOUS | Status: DC | PRN

## 2013-07-30 MED ORDER — HEPARIN BOLUS VIA INFUSION
3000.0000 [IU] | Freq: Once | INTRAVENOUS | Status: AC
  Administered 2013-07-30: 3000 [IU] via INTRAVENOUS

## 2013-07-30 MED ORDER — EPINEPHRINE HCL 1 MG/ML IJ SOLN
0.5000 ug/min | INTRAVENOUS | Status: DC
  Filled 2013-07-30: qty 4

## 2013-07-30 MED ORDER — LACTATED RINGERS IV SOLN
INTRAVENOUS | Status: DC | PRN
  Administered 2013-07-30 (×2): via INTRAVENOUS

## 2013-07-30 MED ORDER — DEXTROSE 5 % IV SOLN
750.0000 mg | INTRAVENOUS | Status: DC
  Filled 2013-07-30: qty 750

## 2013-07-30 MED ORDER — THROMBIN 20000 UNITS EX KIT
PACK | CUTANEOUS | Status: DC | PRN
  Administered 2013-07-30: 20000 [IU] via TOPICAL

## 2013-07-30 MED ORDER — HEPARIN SODIUM (PORCINE) 1000 UNIT/ML IJ SOLN
INTRAMUSCULAR | Status: DC
  Filled 2013-07-30: qty 30

## 2013-07-30 MED ORDER — MIDAZOLAM HCL 5 MG/5ML IJ SOLN
INTRAMUSCULAR | Status: DC | PRN
  Administered 2013-07-30 (×3): 2 mg via INTRAVENOUS
  Administered 2013-07-30: 3 mg via INTRAVENOUS
  Administered 2013-07-30: 1 mg via INTRAVENOUS

## 2013-07-30 MED ORDER — FENTANYL CITRATE 0.05 MG/ML IJ SOLN
INTRAMUSCULAR | Status: DC | PRN
  Administered 2013-07-30: 50 ug via INTRAVENOUS
  Administered 2013-07-30: 100 ug via INTRAVENOUS
  Administered 2013-07-30: 250 ug via INTRAVENOUS
  Administered 2013-07-30: 50 ug via INTRAVENOUS
  Administered 2013-07-30: 100 ug via INTRAVENOUS
  Administered 2013-07-30: 150 ug via INTRAVENOUS
  Administered 2013-07-30 (×2): 50 ug via INTRAVENOUS
  Administered 2013-07-30 (×4): 100 ug via INTRAVENOUS
  Administered 2013-07-30: 50 ug via INTRAVENOUS

## 2013-07-30 MED ORDER — POTASSIUM CHLORIDE 2 MEQ/ML IV SOLN
80.0000 meq | INTRAVENOUS | Status: DC
  Filled 2013-07-30: qty 40

## 2013-07-30 MED ORDER — THROMBIN 20000 UNITS EX SOLR
CUTANEOUS | Status: AC
  Filled 2013-07-30: qty 20000

## 2013-07-30 MED ORDER — DIAZEPAM 5 MG PO TABS: 5.0000 mg | ORAL_TABLET | Freq: Once | ORAL | Status: AC

## 2013-07-30 MED ORDER — ETOMIDATE 2 MG/ML IV SOLN
INTRAVENOUS | Status: DC | PRN
  Administered 2013-07-30: 14 mg via INTRAVENOUS

## 2013-07-30 MED ORDER — PHENYLEPHRINE HCL 10 MG/ML IJ SOLN
30.0000 ug/min | INTRAVENOUS | Status: AC
  Administered 2013-07-30: 20 ug/min via INTRAVENOUS
  Filled 2013-07-30: qty 2

## 2013-07-30 MED ORDER — ARTIFICIAL TEARS OP OINT
TOPICAL_OINTMENT | OPHTHALMIC | Status: DC | PRN
  Administered 2013-07-30: 1 via OPHTHALMIC

## 2013-07-30 MED ORDER — MAGNESIUM SULFATE 50 % IJ SOLN
40.0000 meq | INTRAMUSCULAR | Status: DC
  Filled 2013-07-30: qty 10

## 2013-07-30 MED ORDER — DOPAMINE-DEXTROSE 3.2-5 MG/ML-% IV SOLN
2.0000 ug/kg/min | INTRAVENOUS | Status: DC
  Filled 2013-07-30: qty 250

## 2013-07-30 MED ORDER — VANCOMYCIN HCL 10 G IV SOLR
1250.0000 mg | INTRAVENOUS | Status: DC
  Administered 2013-07-30: 1250 mg via INTRAVENOUS
  Filled 2013-07-30: qty 1250

## 2013-07-30 MED ORDER — LIDOCAINE HCL (PF) 1 % IJ SOLN
INTRAMUSCULAR | Status: AC
  Filled 2013-07-30: qty 30

## 2013-07-30 MED ORDER — SODIUM CHLORIDE 0.9 % IV SOLN
INTRAVENOUS | Status: DC
  Administered 2013-07-30: 1 [IU]/h via INTRAVENOUS
  Filled 2013-07-30: qty 1

## 2013-07-30 MED ORDER — DOPAMINE-DEXTROSE 3.2-5 MG/ML-% IV SOLN
INTRAVENOUS | Status: DC | PRN
  Administered 2013-07-30: 5 ug/kg/min via INTRAVENOUS

## 2013-07-30 MED ORDER — PLASMA-LYTE 148 IV SOLN
INTRAVENOUS | Status: DC
  Filled 2013-07-30: qty 2.5

## 2013-07-30 MED ORDER — DEXMEDETOMIDINE HCL IN NACL 400 MCG/100ML IV SOLN
0.1000 ug/kg/h | INTRAVENOUS | Status: DC
  Filled 2013-07-30: qty 100

## 2013-07-30 MED ORDER — SODIUM CHLORIDE 0.9 % IV SOLN
INTRAVENOUS | Status: DC
  Filled 2013-07-30: qty 30

## 2013-07-30 MED ORDER — NITROGLYCERIN 0.2 MG/ML ON CALL CATH LAB
INTRAVENOUS | Status: AC
  Filled 2013-07-30: qty 1

## 2013-07-30 MED ORDER — HEPARIN (PORCINE) IN NACL 2-0.9 UNIT/ML-% IJ SOLN
INTRAMUSCULAR | Status: AC
Start: 2013-07-30 — End: 2013-07-30
  Filled 2013-07-30: qty 1000

## 2013-07-30 MED ORDER — ROCURONIUM BROMIDE 100 MG/10ML IV SOLN
INTRAVENOUS | Status: DC | PRN
  Administered 2013-07-30 (×2): 20 mg via INTRAVENOUS
  Administered 2013-07-30: 30 mg via INTRAVENOUS
  Administered 2013-07-30: 20 mg via INTRAVENOUS
  Administered 2013-07-30: 50 mg via INTRAVENOUS
  Administered 2013-07-30 – 2013-07-31 (×2): 20 mg via INTRAVENOUS

## 2013-07-30 MED ORDER — DEXTROSE 5 % IV SOLN
1.5000 g | INTRAVENOUS | Status: DC
  Administered 2013-07-30: .75 g via INTRAVENOUS
  Administered 2013-07-30: 1.5 g via INTRAVENOUS
  Filled 2013-07-30: qty 1.5

## 2013-07-30 MED ORDER — PROTAMINE SULFATE 10 MG/ML IV SOLN
INTRAVENOUS | Status: DC | PRN
  Administered 2013-07-30: 140 mg via INTRAVENOUS

## 2013-07-30 MED ORDER — GUAIFENESIN ER 600 MG PO TB12: 1200.0000 mg | ORAL_TABLET | Freq: Two times a day (BID) | ORAL | Status: DC | PRN

## 2013-07-30 MED ORDER — THROMBIN 20000 UNITS EX SOLR
OROMUCOSAL | Status: DC | PRN
  Administered 2013-07-30: 21:00:00 via TOPICAL

## 2013-07-30 MED ORDER — SODIUM CHLORIDE 0.9 % IV SOLN
INTRAVENOUS | Status: DC | PRN
  Administered 2013-07-30: via INTRAVENOUS

## 2013-07-30 MED ORDER — ASPIRIN 81 MG PO CHEW: 81.0000 mg | CHEWABLE_TABLET | Freq: Every day | ORAL | Status: DC

## 2013-07-30 MED ORDER — DEXMEDETOMIDINE HCL IN NACL 400 MCG/100ML IV SOLN
0.1000 ug/kg/h | INTRAVENOUS | Status: AC
  Administered 2013-07-30: 0.3 ug/kg/h via INTRAVENOUS

## 2013-07-30 MED ORDER — MIDAZOLAM HCL 2 MG/2ML IJ SOLN
INTRAMUSCULAR | Status: AC
  Filled 2013-07-30: qty 2

## 2013-07-30 MED ORDER — ASPIRIN 81 MG PO TBEC: 81.0000 mg | DELAYED_RELEASE_TABLET | Freq: Every day | ORAL | Status: DC

## 2013-07-30 MED ORDER — ASPIRIN 300 MG RE SUPP: 300.0000 mg | RECTAL | Status: DC

## 2013-07-30 MED ORDER — HEPARIN (PORCINE) IN NACL 100-0.45 UNIT/ML-% IJ SOLN
850.0000 [IU]/h | INTRAMUSCULAR | Status: DC
  Administered 2013-07-30 (×2): 850 [IU]/h via INTRAVENOUS
  Filled 2013-07-30: qty 250

## 2013-07-30 MED ORDER — HEMOSTATIC AGENTS (NO CHARGE) OPTIME
TOPICAL | Status: DC | PRN
  Administered 2013-07-30: 1 via TOPICAL

## 2013-07-30 MED ORDER — SODIUM CHLORIDE 0.9 % IV SOLN
INTRAVENOUS | Status: DC
  Filled 2013-07-30: qty 40

## 2013-07-30 MED ORDER — FUROSEMIDE 10 MG/ML IJ SOLN
INTRAMUSCULAR | Status: AC
  Filled 2013-07-30: qty 4

## 2013-07-30 MED ORDER — FENTANYL CITRATE 0.05 MG/ML IJ SOLN
INTRAMUSCULAR | Status: AC
  Filled 2013-07-30: qty 2

## 2013-07-30 MED ORDER — HEPARIN SODIUM (PORCINE) 1000 UNIT/ML IJ SOLN
INTRAMUSCULAR | Status: DC | PRN
  Administered 2013-07-30: 20000 [IU] via INTRAVENOUS

## 2013-07-30 MED ORDER — DOPAMINE-DEXTROSE 3.2-5 MG/ML-% IV SOLN: 2.0000 ug/kg/min | INTRAVENOUS | Status: DC

## 2013-07-30 MED ORDER — SODIUM CHLORIDE 0.9 % IV SOLN
INTRAVENOUS | Status: DC
  Filled 2013-07-30: qty 1

## 2013-07-30 MED ORDER — PHENYLEPHRINE HCL 10 MG/ML IJ SOLN
30.0000 ug/min | INTRAVENOUS | Status: DC
  Filled 2013-07-30: qty 2

## 2013-07-30 MED ORDER — NITROGLYCERIN IN D5W 200-5 MCG/ML-% IV SOLN
2.0000 ug/min | INTRAVENOUS | Status: DC
  Administered 2013-07-30: 5 ug/min via INTRAVENOUS
  Filled 2013-07-30: qty 250

## 2013-07-30 MED ORDER — ASPIRIN 81 MG PO CHEW: 324.0000 mg | CHEWABLE_TABLET | Freq: Once | ORAL | Status: DC

## 2013-07-30 MED ORDER — ASPIRIN 81 MG PO CHEW: 324.0000 mg | CHEWABLE_TABLET | ORAL | Status: DC

## 2013-07-30 MED ORDER — LEVOFLOXACIN IN D5W 500 MG/100ML IV SOLN
500.0000 mg | Freq: Every day | INTRAVENOUS | Status: DC
  Administered 2013-07-31 – 2013-08-05 (×6): 500 mg via INTRAVENOUS
  Filled 2013-07-30 (×10): qty 100

## 2013-07-30 MED ORDER — NITROGLYCERIN IN D5W 200-5 MCG/ML-% IV SOLN: 2.0000 ug/min | INTRAVENOUS | Status: DC

## 2013-07-30 MED ORDER — SODIUM CHLORIDE 0.9 % IV SOLN
INTRAVENOUS | Status: DC
  Administered 2013-07-30: 5 mL/h via INTRAVENOUS
  Filled 2013-07-30: qty 40

## 2013-07-30 SURGICAL SUPPLY — 108 items
ADH SKN CLS APL DERMABOND .7 (GAUZE/BANDAGES/DRESSINGS) ×1
ATTRACTOMAT 16X20 MAGNETIC DRP (DRAPES) ×2 IMPLANT
BAG DECANTER FOR FLEXI CONT (MISCELLANEOUS) ×2 IMPLANT
BANDAGE ELASTIC 3 VELCRO ST LF (GAUZE/BANDAGES/DRESSINGS) ×1 IMPLANT
BANDAGE ELASTIC 4 VELCRO ST LF (GAUZE/BANDAGES/DRESSINGS) ×2 IMPLANT
BANDAGE ELASTIC 6 VELCRO ST LF (GAUZE/BANDAGES/DRESSINGS) ×2 IMPLANT
BANDAGE GAUZE ELAST BULKY 4 IN (GAUZE/BANDAGES/DRESSINGS) ×2 IMPLANT
BASKET HEART (ORDER IN 25'S) (MISCELLANEOUS) ×1
BASKET HEART (ORDER IN 25S) (MISCELLANEOUS) ×1 IMPLANT
BLADE STERNUM SYSTEM 6 (BLADE) ×2 IMPLANT
BLADE SURG 11 STRL SS (BLADE) ×1 IMPLANT
CANISTER SUCTION 2500CC (MISCELLANEOUS) ×2 IMPLANT
CANNULA GUNDRY RCSP 15FR (MISCELLANEOUS) ×1 IMPLANT
CANNULA VENOUS LOW PROF 34X46 (CANNULA) ×3 IMPLANT
CARDIOPLEGIA INFUSION (MISCELLANEOUS) ×1 IMPLANT
CATH ROBINSON RED A/P 18FR (CATHETERS) ×5 IMPLANT
CATH THORACIC 28FR (CATHETERS) ×2 IMPLANT
CATH THORACIC 28FR RT ANG (CATHETERS) IMPLANT
CATH THORACIC 36FR (CATHETERS) ×2 IMPLANT
CATH THORACIC 36FR RT ANG (CATHETERS) ×2 IMPLANT
CLIP TI MEDIUM 24 (CLIP) IMPLANT
CLIP TI WIDE RED SMALL 24 (CLIP) IMPLANT
CLOTH BEACON ORANGE TIMEOUT ST (SAFETY) ×2 IMPLANT
CONT SPEC 4OZ CLIKSEAL STRL BL (MISCELLANEOUS) ×1 IMPLANT
COVER SURGICAL LIGHT HANDLE (MISCELLANEOUS) ×2 IMPLANT
CRADLE DONUT ADULT HEAD (MISCELLANEOUS) ×2 IMPLANT
DERMABOND ADVANCED (GAUZE/BANDAGES/DRESSINGS) ×1
DERMABOND ADVANCED .7 DNX12 (GAUZE/BANDAGES/DRESSINGS) IMPLANT
DRAPE CARDIOVASCULAR INCISE (DRAPES) ×2
DRAPE SLUSH/WARMER DISC (DRAPES) ×1 IMPLANT
DRAPE SRG 135X102X78XABS (DRAPES) ×1 IMPLANT
DRSG COVADERM 4X14 (GAUZE/BANDAGES/DRESSINGS) ×2 IMPLANT
DRSG TEGADERM 4X4.75 (GAUZE/BANDAGES/DRESSINGS) ×1 IMPLANT
ELECT CAUTERY BLADE 6.4 (BLADE) ×2 IMPLANT
ELECT REM PT RETURN 9FT ADLT (ELECTROSURGICAL) ×4
ELECTRODE REM PT RTRN 9FT ADLT (ELECTROSURGICAL) ×2 IMPLANT
GLOVE BIO SURGEON STRL SZ 6 (GLOVE) ×3 IMPLANT
GLOVE BIO SURGEON STRL SZ 6.5 (GLOVE) ×1 IMPLANT
GLOVE BIO SURGEON STRL SZ7 (GLOVE) ×2 IMPLANT
GLOVE BIO SURGEON STRL SZ7.5 (GLOVE) IMPLANT
GLOVE BIOGEL PI IND STRL 6 (GLOVE) IMPLANT
GLOVE BIOGEL PI IND STRL 6.5 (GLOVE) IMPLANT
GLOVE BIOGEL PI IND STRL 7.0 (GLOVE) IMPLANT
GLOVE BIOGEL PI INDICATOR 6 (GLOVE)
GLOVE BIOGEL PI INDICATOR 6.5 (GLOVE)
GLOVE BIOGEL PI INDICATOR 7.0 (GLOVE) ×4
GLOVE EUDERMIC 7 POWDERFREE (GLOVE) ×6 IMPLANT
GLOVE ORTHO TXT STRL SZ7.5 (GLOVE) IMPLANT
GOWN PREVENTION PLUS XLARGE (GOWN DISPOSABLE) ×2 IMPLANT
GOWN STRL NON-REIN LRG LVL3 (GOWN DISPOSABLE) ×8 IMPLANT
HEMOSTAT POWDER SURGIFOAM 1G (HEMOSTASIS) ×6 IMPLANT
HEMOSTAT SURGICEL 2X14 (HEMOSTASIS) ×2 IMPLANT
INSERT FOGARTY 61MM (MISCELLANEOUS) IMPLANT
INSERT FOGARTY XLG (MISCELLANEOUS) IMPLANT
KIT BASIN OR (CUSTOM PROCEDURE TRAY) ×2 IMPLANT
KIT CATH CPB BARTLE (MISCELLANEOUS) ×2 IMPLANT
KIT ROOM TURNOVER OR (KITS) ×2 IMPLANT
KIT SUCTION CATH 14FR (SUCTIONS) ×2 IMPLANT
KIT VASOVIEW W/TROCAR VH 2000 (KITS) ×2 IMPLANT
NS IRRIG 1000ML POUR BTL (IV SOLUTION) ×10 IMPLANT
PACK OPEN HEART (CUSTOM PROCEDURE TRAY) ×2 IMPLANT
PAD ARMBOARD 7.5X6 YLW CONV (MISCELLANEOUS) ×4 IMPLANT
PAD ELECT DEFIB RADIOL ZOLL (MISCELLANEOUS) ×2 IMPLANT
PENCIL BUTTON HOLSTER BLD 10FT (ELECTRODE) ×2 IMPLANT
PUNCH AORTIC ROTATE 4.0MM (MISCELLANEOUS) IMPLANT
PUNCH AORTIC ROTATE 4.5MM 8IN (MISCELLANEOUS) ×2 IMPLANT
PUNCH AORTIC ROTATE 5MM 8IN (MISCELLANEOUS) IMPLANT
SET CARDIOPLEGIA MPS 5001102 (MISCELLANEOUS) ×1 IMPLANT
SPONGE GAUZE 4X4 12PLY (GAUZE/BANDAGES/DRESSINGS) ×5 IMPLANT
SPONGE INTESTINAL PEANUT (DISPOSABLE) IMPLANT
SPONGE LAP 18X18 X RAY DECT (DISPOSABLE) ×1 IMPLANT
SPONGE LAP 4X18 X RAY DECT (DISPOSABLE) ×2 IMPLANT
SUT BONE WAX W31G (SUTURE) ×2 IMPLANT
SUT MNCRL AB 4-0 PS2 18 (SUTURE) IMPLANT
SUT PROLENE 3 0 SH DA (SUTURE) IMPLANT
SUT PROLENE 3 0 SH1 36 (SUTURE) ×2 IMPLANT
SUT PROLENE 4 0 RB 1 (SUTURE) ×2
SUT PROLENE 4 0 SH DA (SUTURE) IMPLANT
SUT PROLENE 4-0 RB1 .5 CRCL 36 (SUTURE) IMPLANT
SUT PROLENE 5 0 C 1 36 (SUTURE) IMPLANT
SUT PROLENE 6 0 C 1 30 (SUTURE) ×4 IMPLANT
SUT PROLENE 7 0 BV 1 (SUTURE) IMPLANT
SUT PROLENE 7 0 BV1 MDA (SUTURE) ×2 IMPLANT
SUT PROLENE 8 0 BV175 6 (SUTURE) IMPLANT
SUT SILK  1 MH (SUTURE)
SUT SILK 1 MH (SUTURE) IMPLANT
SUT STEEL 6MS V (SUTURE) ×2 IMPLANT
SUT STEEL STERNAL CCS#1 18IN (SUTURE) IMPLANT
SUT STEEL SZ 6 DBL 3X14 BALL (SUTURE) IMPLANT
SUT VIC AB 1 CTX 36 (SUTURE) ×6
SUT VIC AB 1 CTX36XBRD ANBCTR (SUTURE) ×2 IMPLANT
SUT VIC AB 2-0 CT1 27 (SUTURE)
SUT VIC AB 2-0 CT1 TAPERPNT 27 (SUTURE) IMPLANT
SUT VIC AB 2-0 CTX 27 (SUTURE) IMPLANT
SUT VIC AB 3-0 SH 27 (SUTURE)
SUT VIC AB 3-0 SH 27X BRD (SUTURE) IMPLANT
SUT VIC AB 3-0 X1 27 (SUTURE) IMPLANT
SUT VICRYL 4-0 PS2 18IN ABS (SUTURE) IMPLANT
SUTURE E-PAK OPEN HEART (SUTURE) ×2 IMPLANT
SYSTEM SAHARA CHEST DRAIN ATS (WOUND CARE) ×2 IMPLANT
TAPE CLOTH SOFT 2X10 (GAUZE/BANDAGES/DRESSINGS) ×1 IMPLANT
TOWEL OR 17X24 6PK STRL BLUE (TOWEL DISPOSABLE) ×2 IMPLANT
TOWEL OR 17X26 10 PK STRL BLUE (TOWEL DISPOSABLE) ×2 IMPLANT
TRAY FOLEY IC TEMP SENS 14FR (CATHETERS) ×2 IMPLANT
TUBE SUCT INTRACARD DLP 20F (MISCELLANEOUS) ×2 IMPLANT
TUBING INSUFFLATION 10FT LAP (TUBING) ×2 IMPLANT
UNDERPAD 30X30 INCONTINENT (UNDERPADS AND DIAPERS) ×2 IMPLANT
WATER STERILE IRR 1000ML POUR (IV SOLUTION) ×4 IMPLANT

## 2013-07-30 NOTE — ED Notes (Signed)
MD at bedside. 

## 2013-07-30 NOTE — Progress Notes (Addendum)
ANTICOAGULATION CONSULT NOTE - Initial Consult  Pharmacy Consult for Heparin Indication: chest pain/ACS  No Known Allergies  Patient Measurements: 61 kg  Labs:  Recent Labs  07/30/13 1253  HGB 13.9  HCT 39.7  PLT 204  CREATININE 1.03  TROPONINI 17.75*    The CrCl is unknown because both a height and weight (above a minimum accepted value) are required for this calculation.   Medical History: Past Medical History  Diagnosis Date  . Dyslipidemia   . History of esophageal cancer     s/p transhiatal esophagogastrectomy  . Hypothyroidism   . Cerebral aneurysm     [place]. repair  1994  . History of lung cancer   . Pneumonia   . Cancer     Esophageal, adrenal gland, skin; lung  . Stroke 1995    denies residual  . 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 SBO (small bowel obstruction) 08/15/2012    History of small bowel obstruction (status post small bowel       resection, lysis of adhesions, incidental appendectomy, and repair       of left diaphragmatic hernia  . AAA (abdominal aortic aneurysm) 08/22/2012    3.9 cm by CT  - June 2013, stable     Assessment: 72 year old admitted with SOB and NSTEMI.  Renal function is stable.  Plan is for cath once respiratory status is improved.  Goal of Therapy:  Heparin level 0.3-0.7 units/ml Monitor platelets by anticoagulation protocol: Yes   Plan:  1) Heparin 3000 units iv bolus x 1 2) Heparin drip at 850 units / hr 3) Heparin level 6 hours after heparin begins 4) Daily heparin level, CBC  Thank you. Okey Regal, PharmD [phone]  07/30/2013,3:47 PM

## 2013-07-30 NOTE — ED Notes (Signed)
Phlebotomy called to draw cultures and lactic

## 2013-07-30 NOTE — ED Notes (Signed)
2 weeks ago, pt had pleural effusion drained (200 cc) on left lung. Lobectomy on right lung due to hx lung CA hx around 2011. Pt presents as weak and malaised and wet cough. Green sputum.

## 2013-07-30 NOTE — Brief Op Note (Signed)
07/30/2013  10:49 PM  PATIENT:  [place]  72 y.o. male  PRE-OPERATIVE DIAGNOSIS:  Severe Multi-vessel Coronary Artery disease  POST-OPERATIVE DIAGNOSIS:  Severe Multi-Vessel Coronary artery disease  PROCEDURE:  Procedure(s):  EMERGENT CORONARY ARTERY BYPASS GRAFTING  X 2 -LIMA to LAD -SVG to DISTAL LEFT CIRCUMFLEX  ENDOSCOPIC SAPHENOUS VEIN HARVEST LEFT THIGH  SURGEON:  Surgeon(s) and Role:    * Alleen Borne, MD - Primary  PHYSICIAN ASSISTANT: Erin Barrett PA-C  ANESTHESIA:   general  EBL:  Total I/O In: 1000 [I.V.:1000] Out: 1700 [Urine:1700]  BLOOD ADMINISTERED: CELLSAVER  DRAINS: Right and Left Pleural chest tubes, Mediastinal chest drains   LOCAL MEDICATIONS USED:  NONE  SPECIMEN:  No Specimen  DISPOSITION OF SPECIMEN:  N/A  COUNTS:  YES  TOURNIQUET:  * No tourniquets in log *  DICTATION: .Dragon Dictation  PLAN OF CARE: Admit to inpatient   PATIENT DISPOSITION:  ICU - intubated and hemodynamically stable.   Delay start of Pharmacological VTE agent (>24hrs) due to surgical blood loss or risk of bleeding: yes

## 2013-07-30 NOTE — H&P (Signed)
History and Physical   Patient ID: Glen Green MRN: [id number], DOB/AGE: 71-Jan-1942 71 y.o. Date of Encounter: 07/30/2013  Primary Physician: Lorenda Peck, MD Primary Cardiologist: New  Chief Complaint:  SOB  HPI: Glen Green is a 72 y.o. male with no history of CAD. His baseline activity level is poor, with chronic DOE. He also gets bilateral leg weakness with exertion. He has never had chest pain, except epigastric pain in the setting of specific foods. He denies PND, orthopnea or edema.  Yesterday, he was playing golf and had to keep the cart on the path. This cause him to walk much more than usual for him. He developed significantly increased DOE and LE weakness. By the 13th or 14th hole, he could barely walk. He came home and was crying from the symptoms. He did not have chest pain. The DOE and weakness continued. He developed N&V last pm, and got up several times with this. He also had diarrhea. He could not lie down without coughing a great deal.   The cough was non-productive. He had no fevers. When his symptoms did not improve, he came to the ER. He was evaluated for possible infection but a troponin was checked and was elevated, so cardiology was asked to evaluate him. Currently, he is sitting up and on O2, so is breathing better, but cannot do anything without coughing or feeling SOB.  Past Medical History  Diagnosis Date  . Dyslipidemia   . History of esophageal cancer     s/p transhiatal esophagogastrectomy  . Hypothyroidism   . Cerebral aneurysm     [place]. repair  1994  . History of lung cancer   . Pneumonia   . Cancer     Esophageal, adrenal gland, skin; lung  . Stroke 1995    denies residual  . 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 SBO (small bowel obstruction) 08/15/2012    History of small bowel obstruction (status post small bowel       resection, lysis of adhesions, incidental appendectomy, and repair       of left diaphragmatic  hernia  . AAA (abdominal aortic aneurysm) 08/22/2012    3.9 cm by CT  - June 2013, stable     Surgical History:  Past Surgical History  Procedure Laterality Date  . Left upper lobectomy with node dissection  02/24/2010    [place]  . Exploratory laparotomy, lysis of adhesions, reduce of incarcerated small bowel and colon from the chest, limited small bowel resection, incidental appendectomy, and then repair of diaphragmatic hernia with alloderm mesh  10/27/2009    [place]  . Left subclavian port- a-cath insertion  03/09/2004    [place]  . Exploratory laparotomy,exploratory thoracotomy for hemorrhage  02/11/2003    [place]  . Transhiatal esophagectomy with cholecystectomy, jejunostomy,  pyloroplasty and removal of right subclavian port-a- cath      [facility]  . Brain surgery      brain aneursyn  . Tonsillectomy    . Cholecystectomy       I have reviewed the patient's current medications. Prior to Admission medications   Medication Sig Start Date End Date Taking? Authorizing Provider  aspirin 81 MG EC tablet Take 1 tablet (81 mg total) by mouth daily. Swallow whole. 08/22/12 08/22/13 Yes [name], MD  guaiFENesin (MUCINEX) 600 MG 12 hr tablet Take 1,200 mg by mouth 2 (two) times daily as needed for congestion.   Yes Historical Provider,  MD   Scheduled Meds:  Continuous Infusions: . heparin 850 Units/hr (07/30/13 1628)  . [START ON 07/31/2013] levofloxacin (LEVAQUIN) IV     PRN Meds:.  Allergies: No Known Allergies  History   Social History  . Marital Status: Married    Spouse Name: N/A    Number of Children: N/A  . Years of Education: N/A   Occupational History  . retired    Social History Main Topics  . Smoking status: Former Smoker    Types: Cigarettes    Quit date: 12/10/2002  . Smokeless tobacco: Never Used  . Alcohol Use: No  . Drug Use: No  . Sexual Activity: Not Currently   Other Topics Concern  . Not on file   Social History Narrative   Pt lives with wife.      Family History  Problem Relation Age of Onset  . Aneurysm Mother   . Kidney disease Father   . Heart disease Maternal Uncle   . Heart disease Paternal Uncle   . Aneurysm Maternal Grandmother    Family Status  Relation Status Death Age  . Mother Deceased 62    Aneurysm  . Father Deceased 69    Kidney failure    Review of Systems:   Full 14-point review of systems otherwise negative except as noted above.  Physical Exam: Blood pressure 102/72, pulse 98, temperature 98.3 F (36.8 C), temperature source Rectal, resp. rate 24, height 5\' 10"  (1.778 m), weight 135 lb (61.236 kg), SpO2 84.00%. General: Well developed, well nourished,male in mild distress. Head: Normocephalic, atraumatic, sclera non-icteric, no xanthomas, nares are without discharge. Dentition: moderate Neck: No carotid bruits. JVD elevated at 12 cm. No thyromegally Lungs: Good expansion bilaterally. without wheezes or rhonchi. Has bibasilar rales with decreased breath sounds in the bases. Heart: Regular rate and rhythm with S1 S2.  No S3 or S4.  No murmur, no rubs, or gallops appreciated. Abdomen: Soft, non-tender, non-distended with normoactive bowel sounds. No hepatomegaly. No rebound/guarding. No obvious abdominal masses. Msk:  Strength and tone appear a little weak for age. No joint deformities or effusions, no spine or costo-vertebral angle tenderness. Extremities: No clubbing or cyanosis. No edema.  Distal pedal pulses are 2+ in 4 extrem Neuro: Alert and oriented X 3. Moves all extremities spontaneously. No focal deficits noted. Psych:  Responds to questions appropriately with a normal affect. Skin: No rashes or lesions noted  Labs:   Lab Results  Component Value Date   WBC 6.9 07/30/2013   HGB 13.9 07/30/2013   HCT 39.7 07/30/2013   MCV 89.4 07/30/2013   PLT 204 07/30/2013    Recent Labs  07/30/13 1521  INR 1.13     Recent Labs Lab 07/30/13 1253  NA 141  K 4.5  CL 105  CO2 26  BUN 28*   CREATININE 1.03  CALCIUM 9.7  PROT 7.3  BILITOT 0.6  ALKPHOS 155*  ALT 74*  AST 159*  GLUCOSE 144*    Recent Labs  07/30/13 1253 07/30/13 1521  CKTOTAL  --  965*  CKMB  --  91.2*  TROPONINI 17.75* 18.73*   Lab Results  Component Value Date   CHOL 197 08/22/2012   HDL 79.70 08/22/2012   LDLCALC 101* 08/22/2012   TRIG 81.0 08/22/2012   Pro B Natriuretic peptide (BNP)  Date/Time Value Range Status  07/30/2013 12:53 PM 10512.0* 0 - 125 pg/mL Final  12/05/2011  4:20 PM 777.7* 0 - 125 pg/mL Final   Radiology/Studies: Dg  Chest Port 1 View 07/30/2013   CLINICAL DATA:  Shortness of breath, cough, congestion.  EXAM: PORTABLE CHEST - 1 VIEW  COMPARISON:  07/13/2013  FINDINGS: There is hyperinflation of the lungs compatible with COPD. Bilateral perihilar and lower lobe airspace opacities. This could reflect edema or infection. Small bilateral effusions. Heart is borderline in size. No acute bony abnormality.  IMPRESSION: Bilateral lower lobe airspace opacities with effusions. FINDINGS could reflect edema/ CHF or pneumonia. Recommend clinical correlation.  COPD.   Electronically Signed   By: Charlett Nose   On: 07/30/2013 13:38   Ct Chest Wo Contrast 07/13/2013   *RADIOLOGY REPORT*  Clinical Data: History of lung carcinoma with right pleural effusion and abnormal pleural enhancement by PET scan.  Evaluation is performed for possible pleural biopsy.  CT CHEST WITHOUT CONTRAST  Technique:  Multidetector CT imaging of the chest was performed following the standard protocol without IV contrast.  Comparison: PET scan on 07/07/2013  Findings: Imaging was performed in a prone position. There is no evidence of right-sided pleural mass.  A small pleural effusion is present which layers dependently in the anterior hemithorax when the patient is in a prone position.  Significant COPD present.  No pulmonary nodules are identified.  IMPRESSION: No evidence of pleural mass.  A small layering right-sided pleural  effusion is present. An ultrasound-guided right-sided thoracentesis was subsequently performed.   Original Report Authenticated By: Irish Lack, M.D.    07/02/2013   *RADIOLOGY REPORT*  Clinical Data: History of esophageal cancer with esophagectomy and gastric pull-through.  CT CHEST WITH CONTRAST  Technique:  Multidetector CT imaging of the chest was performed following the standard protocol during bolus administration of intravenous contrast.  Contrast: OMNIPAQUE IOHEXOL 300 MG/ML  SOLN  Comparison: 05/26/2012  Findings: The lungs are well-aerated bilaterally with emphysematous changes noted. A small right-sided pleural effusion is identified with some associated right lower lobe atelectasis.  This is new from prior exam.  No new focal infiltrate or sizable parenchymal nodule is seen.  There are changes consistent with the patient's given clinical history of esophagectomy and gastric pull-through.  An air and fluid is noted within the stomach.  No obstructive changes are seen.  The thoracic aorta and pulmonary artery are less than stable.  Mild dilatation of the proximal ascending aorta is again seen and stable.  Heavy coronary calcifications are again seen. No hilar or mediastinal adenopathy is identified.  Scanning into the upper abdomen reveals postsurgical changes.  IMPRESSION: Changes consistent with the known history of esophagectomy and gastric pull-through.  The overall appearance is stable.  No recurrent disease is seen.  Stable dilatation of the ascending aorta.  Stable emphysematous changes.   Original Report Authenticated By: Alcide Clever, M.D.   Ct Abdomen W Contrast 07/02/2013   *RADIOLOGY REPORT*  Clinical Data: Follow-up for the soft medial and lung cancer.  CT ABDOMEN WITH CONTRAST  Technique:  Multidetector CT imaging of the abdomen was performed following the standard protocol during bolus administration of intravenous contrast.  Contrast:  100 ml of Omnipaque-300  Comparison: May 26, 2012  CT of the chest:  Findings: There are extensive emphysematous changes of bilateral lungs.  Stable postsurgical changes are identified involving the left hemithorax.  There are no pulmonary nodules or mass.  There is interval developed minimal left pleural effusion and moderate right pleural effusion.  Along the medial aspect of the right pleural effusion, there is suggestion of pleural based 1 cm nodular enhancement best  seen on series 3 image 46.  There is no mediastinal or hilar lymphadenopathy.  The patient is status post prior gastric pull-through without interval change.  The heart size is normal.  There is no pericardial effusion.  Images of the bones demonstrate no focal discrete lytic or blastic lesion.  Impression:  Interval developed bilateral pleural effusions, right greater than left.  Along the medial aspect of the right pleural effusion, there is suggestion of a pleural based 1 cm nodule enhancement new since prior exam.  Metastasis is not excluded. Further evaluation with right thoracentesis with cytology may be helpful if clinically indicated.  CT of the abdomen:  Findings:  Images of the abdomen demonstrate normal liver.  The patient is status post prior cholecystectomy.  There is postsurgical common bile duct dilatation measuring 1.3 cm unchanged compared prior exam.  The spleen, pancreas, left adrenal gland are normal.  There are bilateral kidneys cysts unchanged.  There is no hydronephrosis bilaterally.  There are stable postsurgical changes in the right retroperitoneum.  There are small stable small lymph nodes in the retroperitoneum periaortic region unchanged.  There is stable infrarenal abdominal aorta measuring 4.1 cm in diameter.  The visualized bowel is normal.  Images of the bones demonstrate no definite focal lytic or blastic lesions.  IMPRESSION: Stable abdomen CT without change compared prior exam.  No CT evidence of abdominal metastatic disease.   Original Report  Authenticated By: Sherian Rein, M.D.   Nm Pet Image Restag (ps) Skull Base To Thigh 07/07/2013   *RADIOLOGY REPORT*  Clinical Data: Subsequent treatment strategy for esophageal cancer and lung cancer.  NUCLEAR MEDICINE PET SKULL BASE TO THIGH  Fasting Blood Glucose:  89  Technique:  15.1 mCi F-18 FDG was injected intravenously. CT data was obtained and used for attenuation correction and anatomic localization only.  (This was not acquired as a diagnostic CT examination.) Additional exam technical data entered on technologist worksheet.  Comparison:  07/02/2013  Findings:  Neck: No hypermetabolic lymph nodes in the neck.  Chest:  Postoperative change from esophagectomy and gastric pull- through identified.  A left upper lobectomy has also been performed.  There has been interval decrease in volume of bilateral pleural effusions.  There is a small focus of mild increased FDG uptake localizing to the previously described loculated right pleural effusion.  The SUV max within this area is equal to 3.0, image 134.  No hypermetabolic mediastinal or hilar nodes.  No suspicious pulmonary nodules on the CT scan.  Abdomen/Pelvis:  No abnormal hypermetabolic activity within the liver, pancreas, adrenal glands, or spleen.  No hypermetabolic lymph nodes in the abdomen or pelvis. Again noted is an abdominal aortic aneurysm.  The maximum AP dimension is equal to 3.6, image 172/series 2.  Skeleton:  No focal hypermetabolic activity to suggest skeletal metastasis.  IMPRESSION:  1.  There is a small focus of mild increased uptake localizing to the partially loculated right pleural effusion.  This is nonspecific and may reflect inflammation or infection.  Malignant pleural effusion cannot be excluded.  Consider further evaluation with diagnostic thoracentesis. 2.  Infrarenal abdominal aortic aneurysm.   Original Report Authenticated By: Signa Kell, M.D.   US Thoracentesis Asp Pleural Space W/img Guide 07/13/2013   *RADIOLOGY  REPORT*  Clinical Data:  History of lung carcinoma with right pleural effusion and possible increased metabolic activity in the right pleural space by PET scan.  ULTRASOUND GUIDED RIGHT THORACENTESIS  Comparison:  PET scan on 07/07/2013  An ultrasound  guided thoracentesis was thoroughly discussed with the patient and questions answered.  The benefits, risks, alternatives and complications were also discussed.  The patient understands and wishes to proceed with the procedure.  Written consent was obtained.  Ultrasound was performed to localize and mark an adequate pocket of fluid in the right chest.  The area was then prepped and draped in the normal sterile fashion.  1% Lidocaine was used for local anesthesia.  Under real time ultrasound guidance a 19 gauge Yueh catheter was introduced.  Thoracentesis was performed.  The catheter was removed and a dressing applied.  Complications:  None  Findings: A total of approximately 230 ml of clear fluid was removed. A fluid sample was sent for cytologic analysis.  IMPRESSION: Successful ultrasound guided right thoracentesis yielding 230 ml of pleural fluid.   Original Report Authenticated By: Irish Lack, M.D.    Echo: 02/15/2003 SUMMARY - Overall left ventricular systolic function was hyperdynamic. Left ventricular ejection fraction was estimated , range being 75 % to 85 %. This study was inadequate for the evaluation of left ventricular regional wall motion. There was Doppler evidence for dynamic left ventricular mid-cavity obstruction at rest, with a peak velocity of 1.5 m/sec , and with a peak gradient of 9 mmHg.   ECG: 10-Jul-2013 13:18:05  Sinus rhythm Prolonged PR interval Incomplete left bundle branch block Low voltage, extremity leads Borderline prolonged QT interval Partial missing lead(s): V2 V3 V4 V5 V6 59mm/s 64mm/mV 150Hz  8.0.1 12SL 235 CID: [id number] Referred by: Unconfirmed Vent. rate 91 BPM PR interval 219 ms QRS duration 111 ms QT/QTc  406/500 ms P-R-T axes 93 59 -62  ASSESSMENT AND PLAN: The patient was seen today by Dr. Patty Sermons, the patient evaluated and the data reviewed.  Principal Problem:   NSTEMI (non-ST elevated myocardial infarction) - Admit CCU, continue to cycle enzymes, add heparin, no statin secondary to abnormal LFTs, no BB/ACE/nitrates secondary to hypotension. Cath when appropriate - symptoms began increasing, will do today.  Active Problems:   Pulmonary edema cardiac cause - diurese as BP will allow, ck echo    Dyslipidemia - check in am, add statin    Hypothyroidism - ck TSH, follow    History of lung cancer - followed by Tyrone Sage, see thoracentesis and radiology reports above.    Weakness generalized - acute on chronic, cardiac rehab to see once improved    PVD (peripheral vascular disease) - good pulses, follow. No claudication symptoms.    Possible PNA  - continue ABX, has had PNA before and it might be masked by the fluid.   Melida Quitter, PA-C 07/30/2013 4:32 PM Beeper [phone] Patient seen in ER. He is in severe heart failure by chest xray with relatively normal heart size, and underlying COPD. He has evidence of acute NSTEMI with troponins of 17 and 18.  ProBNP [place] 105[address]. CKMB is 91.2.  On exam he is anxious and has orthopnea. There is an S3 gallop. No peripheral edema and he has adequate pedal pulses. EKG shows new incomplete LBBB with secondary STT changes new since 2012. Taken urgently from ER to cath lab. ASA and IV heparin given in ER.

## 2013-07-30 NOTE — OR Nursing (Signed)
First call to SICU 

## 2013-07-30 NOTE — CV Procedure (Signed)
   Cardiac Catheterization Procedure Note  Name: Glen Green MRN: [id number] DOB: 1941/09/29  Procedure: Right Heart Cath, Left Heart Cath, Selective Coronary Angiography, LV angiography,IABP placement.  Indication: 72 yo WM presents with marked dyspnea and new IVCD. Cardiac enzymes are elevated consistent with NSTEMI. He has new CHF. BP was low consistent with cardiogenic shock. Emergent cardiac cath was recommended.   Procedural Details: The right groin was prepped, draped, and anesthetized with 1% lidocaine. Using the modified Seldinger technique a 5 French sheath was placed in the right femoral artery and a 7 French sheath was placed in the right femoral vein. A Swan-Ganz catheter was used for the right heart catheterization. Standard protocol was followed for recording of right heart pressures. Standard Judkins catheters were used for selective coronary angiography and left ventriculography. At the end of the diagnostic study we exchanged his femoral sheath for an IABP sheath and an IABP was passed under fluoro guidance. There were no immediate procedural complications. The patient was transferred to the post catheterization recovery area for further monitoring.  Procedural Findings: Hemodynamics RA 12/10 mean of 8 mm Hg RV 55/10 mm Hg PA 53/29 mean of 41 mm Hg PCWP 28/38 mean of 30 mmHg LV 91/19 mm Hg AO 90/65 mean 77 mm Hg  Oxygen saturations: PA N/A AO 99%  Coronary angiography: Coronary dominance: left  Left mainstem: Moderate calcification. No significant disease.  Left anterior descending (LAD): 95% proximal LAD. The mid LAD is occluded with left to left collaterals.  Left circumflex (LCx): Dominant vessel. It is occluded proximally with left to left collaterals.   There is a ramus intermediate vessel that is moderate in size and without significant disease.  Right coronary artery (RCA): nondominant. Diffuse 90% disease proximally.  Left ventriculography: The LV is  enlarged with inferior wall akinesis, severe global hypokinesis and overall markedly reduced systolic function. EF is estimated at 20%. There is moderate mitral insufficiency.  Final Conclusions:   1. Critical 3 vessel occlusive CAD 2. Severe LV dysfunction 3. Moderate MR. 4. Moderate pulmonary HTN with elevated LV filling pressures.  5. Cardiogenic shock.   Recommendations:  IABP placed for hemodynamic support with good augmentation. IV lasix given. Patient is not a candidate for PCI. Dr. Laneta Simmers consulted for CT surgery opinion concerning CABG.   [name] [facility] 07/30/2013, 5:59 PM

## 2013-07-30 NOTE — Consult Note (Signed)
Cardiothoracic Surgery Consultation:  Reason for Consult:Severe multivessel coronary artery disease with severe LV dysfunction and shock s/p acute MI Referring Physician:  Dr. Peter Green  Glen [place] Green is an 72 y.o. male.  HPI:   72 year old gentleman who says he was doing well until yesterday while playing golf when he developed worsening dyspnea and generalized weakness. He quit after 14 holes and went home. He continued to feel poorly with development of nausea and vomiting and could not lie flat without coughing. He came to the ER this afternoon and troponin was elevated at 17. ECG showed incomplete LBBB, no other acute changes. Cath shows severe multivessel disease with occluded LAD after septal with faint L to L collat filling the distal vessel to the apex. The LCX is a dominant vessel and is occluded proximally with L to L collat. The Ramus is moderate in size and has no significant disease. The RCA is small and nondominant with 90% proximal disease. LVEF is 20% with moderate MR. There is pulmonary HTN with PAP 53/29. IABP was inserted.  He was recently seen by Glen Green for workup of a right pleural effusion with some hypermetabolic uptake in the right pleura on PET scan. He had thoracentesis with fluid negative for malignancy. 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 Recent PET showed no other areas of hypermetabolic activity except the right pleura which was minimal.  Past Medical History  Diagnosis Date  . Dyslipidemia   . History of esophageal cancer     s/p transhiatal esophagogastrectomy  . Hypothyroidism   . Cerebral aneurysm     TX. repair  1994  . History of lung cancer   . Pneumonia   . Cancer     Esophageal, adrenal gland, skin; lung  . Stroke 1995    denies residual  . 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 SBO (small bowel obstruction) 08/15/2012    History of small bowel obstruction (status post small bowel       resection, lysis of adhesions, incidental appendectomy, and repair       of left diaphragmatic hernia  . AAA (abdominal aortic aneurysm) 08/22/2012    3.9 cm by CT  - June 2013, stable     Past Surgical History  Procedure Laterality Date  . Left upper lobectomy with node dissection  02/24/2010    [place]  . Exploratory laparotomy, lysis of adhesions, reduce of incarcerated small bowel and colon from the chest, limited small bowel resection, incidental appendectomy, and then repair of diaphragmatic hernia with alloderm mesh  10/27/2009    [place]  . Left subclavian port- a-cath insertion  03/09/2004    [place]  . Exploratory laparotomy,exploratory thoracotomy for hemorrhage  02/11/2003    [place]  . Transhiatal esophagectomy with cholecystectomy, jejunostomy,  pyloroplasty and removal of right subclavian port-a- cath      [facility]  . Brain surgery      brain aneursyn  . Tonsillectomy    . Cholecystectomy      Family History  Problem Relation Age of Onset  . Aneurysm Mother   . Kidney disease Father   . Heart disease Maternal Uncle   . Heart disease Paternal Uncle   . Aneurysm Maternal Grandmother     Social History:  reports that he  quit smoking about 10 years ago. His smoking use included Cigarettes. He smoked 0.00 packs per day. He has never used smokeless tobacco. He reports that he does not drink alcohol or use illicit drugs.  Allergies: No Known Allergies  Medications:  I have reviewed the patient's current medications. Prior to Admission:  Prescriptions prior to admission  Medication Sig Dispense Refill  . aspirin 81 MG EC tablet Take 1 tablet (81 mg total) by mouth daily. Swallow whole.  30 tablet  12  . guaiFENesin (MUCINEX) 600 MG 12 hr tablet Take 1,200 mg by mouth 2 (two) times daily as needed for congestion.        Scheduled: . aspirin  324 mg Oral Once  . [START ON 07/31/2013] aspirin  81 mg Oral Daily  . [START ON 07/31/2013] levofloxacin (LEVAQUIN) IV  500 mg Intravenous Daily   Continuous: . heparin 850 Units/hr (07/30/13 1637)   [id number] Anti-infectives   Start     Dose/Rate Route Frequency Ordered Stop   07/31/13 1500  levofloxacin (LEVAQUIN) IVPB 500 mg     500 mg 100 mL/hr over 60 Minutes Intravenous Daily 07/30/13 1538 08/07/13 0959   07/30/13 1845  vancomycin (VANCOCIN) 1,250 mg in sodium chloride 0.9 % 250 mL IVPB  Status:  Discontinued     1,250 mg 166.7 mL/hr over 90 Minutes Intravenous To Surgery 07/30/13 1836 07/30/13 1851   07/30/13 1845  cefUROXime (ZINACEF) 1.5 g in dextrose 5 % 50 mL IVPB  Status:  Discontinued     1.5 g 100 mL/hr over 30 Minutes Intravenous To Surgery 07/30/13 1836 07/30/13 1851   07/30/13 1845  cefUROXime (ZINACEF) 750 mg in dextrose 5 % 50 mL IVPB  Status:  Discontinued     750 mg 100 mL/hr over 30 Minutes Intravenous To Surgery 07/30/13 1836 07/30/13 1851   07/30/13 1415  levofloxacin (LEVAQUIN) IVPB 500 mg     500 mg 100 mL/hr over 60 Minutes Intravenous  Once 07/30/13 1408 07/30/13 1628      Results for orders placed during the hospital encounter of 07/30/13 (from the past 48 hour(s))  CBC WITH DIFFERENTIAL     Status: Abnormal   Collection Time    07/30/13 12:53 PM      Result Value Range   WBC 6.9  4.0 - 10.5 K/uL   RBC 4.44  4.22 - 5.81 MIL/uL   Hemoglobin 13.9  13.0 - 17.0 g/dL   HCT 19.1  47.8 - 29.5 %   MCV 89.4  78.0 - 100.0 fL   MCH 31.3  26.0 - 34.0 pg   MCHC 35.0  30.0 - 36.0 g/dL   RDW 62.1 (*) 30.8 - 65.7 %   Platelets 204  150 - 400 K/uL   Neutrophils Relative % 65  43 - 77 %   Neutro Abs 4.5  1.7 - 7.7 K/uL   Lymphocytes Relative 17  12 - 46 %   Lymphs Abs 1.2  0.7 - 4.0 K/uL   Monocytes Relative 16 (*) 3 - 12 %   Monocytes Absolute 1.1 (*) 0.1 - 1.0 K/uL   Eosinophils Relative 2  0 - 5 %   Eosinophils Absolute  0.1  0.0 - 0.7 K/uL   Basophils Relative 0  0 - 1 %   Basophils Absolute 0.0  0.0 - 0.1 K/uL  COMPREHENSIVE METABOLIC PANEL     Status: Abnormal   Collection Time    07/30/13 12:53 PM      Result Value  Range   Sodium 141  135 - 145 mEq/L   Potassium 4.5  3.5 - 5.1 mEq/L   Chloride 105  96 - 112 mEq/L   CO2 26  19 - 32 mEq/L   Glucose, Bld 144 (*) 70 - 99 mg/dL   BUN 28 (*) 6 - 23 mg/dL   Creatinine, Ser 1.61  0.50 - 1.35 mg/dL   Calcium 9.7  8.4 - 09.6 mg/dL   Total Protein 7.3  6.0 - 8.3 g/dL   Albumin 3.6  3.5 - 5.2 g/dL   AST 045 (*) 0 - 37 U/L   ALT 74 (*) 0 - 53 U/L   Alkaline Phosphatase 155 (*) 39 - 117 U/L   Total Bilirubin 0.6  0.3 - 1.2 mg/dL   GFR calc non Af Amer 71 (*) >90 mL/min   GFR calc Af Amer 82 (*) >90 mL/min   Comment: (NOTE)     The eGFR has been calculated using the CKD EPI equation.     This calculation has not been validated in all clinical situations.     eGFR's persistently <90 mL/min signify possible Chronic Kidney     Disease.  TROPONIN I     Status: Abnormal   Collection Time    07/30/13 12:53 PM      Result Value Range   Troponin I 17.75 (*) <0.30 ng/mL   Comment:            Due to the release kinetics of cTnI,     a negative result within the first hours     of the onset of symptoms does not rule out     myocardial infarction with certainty.     If myocardial infarction is still suspected,     repeat the test at appropriate intervals.     CRITICAL RESULT CALLED TO, READ BACK BY AND VERIFIED WITH:     E.PRYER,RN 1418 07/30/13 CLARK,S  PRO B NATRIURETIC PEPTIDE     Status: Abnormal   Collection Time    07/30/13 12:53 PM      Result Value Range   Pro B Natriuretic peptide (BNP) 10512.0 (*) 0 - 125 pg/mL  URINALYSIS, ROUTINE W REFLEX MICROSCOPIC     Status: Abnormal   Collection Time    07/30/13  2:22 PM      Result Value Range   Color, Urine AMBER (*) YELLOW   Comment: BIOCHEMICALS MAY BE AFFECTED BY COLOR   APPearance CLEAR  CLEAR    Specific Gravity, Urine 1.028  1.005 - 1.030   pH 5.0  5.0 - 8.0   Glucose, UA NEGATIVE  NEGATIVE mg/dL   Hgb urine dipstick NEGATIVE  NEGATIVE   Bilirubin Urine SMALL (*) NEGATIVE   Ketones, ur NEGATIVE  NEGATIVE mg/dL   Protein, ur NEGATIVE  NEGATIVE mg/dL   Urobilinogen, UA 1.0  0.0 - 1.0 mg/dL   Nitrite NEGATIVE  NEGATIVE   Leukocytes, UA NEGATIVE  NEGATIVE   Comment: MICROSCOPIC NOT DONE ON URINES WITH NEGATIVE PROTEIN, BLOOD, LEUKOCYTES, NITRITE, OR GLUCOSE <1000 mg/dL.  CK TOTAL AND CKMB     Status: Abnormal   Collection Time    07/30/13  3:21 PM      Result Value Range   Total CK 965 (*) 7 - 232 U/L   CK, MB 91.2 (*) 0.3 - 4.0 ng/mL   Comment: CRITICAL RESULT CALLED TO, READ BACK BY AND VERIFIED WITH:     H RINGLEY,RN 1626 07/30/13 WBOND  Relative Index 9.5 (*) 0.0 - 2.5  TROPONIN I     Status: Abnormal   Collection Time    07/30/13  3:21 PM      Result Value Range   Troponin I 18.73 (*) <0.30 ng/mL   Comment:            Due to the release kinetics of cTnI,     a negative result within the first hours     of the onset of symptoms does not rule out     myocardial infarction with certainty.     If myocardial infarction is still suspected,     repeat the test at appropriate intervals.     CRITICAL RESULT CALLED TO, READ BACK BY AND VERIFIED WITH:     H RINGLEY,RN 1626 07/30/13 WBOND  PROTIME-INR     Status: None   Collection Time    07/30/13  3:21 PM      Result Value Range   Prothrombin Time 14.3  11.6 - 15.2 seconds   INR 1.13  0.00 - 1.49  APTT     Status: None   Collection Time    07/30/13  3:21 PM      Result Value Range   aPTT 32  24 - 37 seconds  CG4 I-STAT (LACTIC ACID)     Status: None   Collection Time    07/30/13  3:42 PM      Result Value Range   Lactic Acid, Venous 1.95  0.5 - 2.2 mmol/L  POCT I-STAT 3, BLOOD GAS (G3+)     Status: Abnormal   Collection Time    07/30/13  5:36 PM      Result Value Range   pH, Arterial 7.394  7.350 - 7.450   pCO2  arterial 33.6 (*) 35.0 - 45.0 mmHg   pO2, Arterial 118.0 (*) 80.0 - 100.0 mmHg   Bicarbonate 20.5  20.0 - 24.0 mEq/L   TCO2 22  0 - 100 mmol/L   O2 Saturation 99.0     Acid-base deficit 4.0 (*) 0.0 - 2.0 mmol/L   Sample type ARTERIAL    CBC     Status: Abnormal   Collection Time    07/30/13  5:40 PM      Result Value Range   WBC 9.3  4.0 - 10.5 K/uL   RBC 3.91 (*) 4.22 - 5.81 MIL/uL   Hemoglobin 12.0 (*) 13.0 - 17.0 g/dL   HCT 16.1 (*) 09.6 - 04.5 %   MCV 89.8  78.0 - 100.0 fL   MCH 30.7  26.0 - 34.0 pg   MCHC 34.2  30.0 - 36.0 g/dL   RDW 40.9 (*) 81.1 - 91.4 %   Platelets 181  150 - 400 K/uL  COMPREHENSIVE METABOLIC PANEL     Status: Abnormal   Collection Time    07/30/13  5:40 PM      Result Value Range   Sodium 137  135 - 145 mEq/L   Potassium 3.7  3.5 - 5.1 mEq/L   Chloride 105  96 - 112 mEq/L   CO2 21  19 - 32 mEq/L   Glucose, Bld 111 (*) 70 - 99 mg/dL   BUN 25 (*) 6 - 23 mg/dL   Creatinine, Ser 7.82  0.50 - 1.35 mg/dL   Calcium 8.8  8.4 - 95.6 mg/dL   Total Protein 6.0  6.0 - 8.3 g/dL   Albumin 2.9 (*) 3.5 - 5.2 g/dL   AST 213 (*)  0 - 37 U/L   ALT 56 (*) 0 - 53 U/L   Alkaline Phosphatase 123 (*) 39 - 117 U/L   Total Bilirubin 0.7  0.3 - 1.2 mg/dL   GFR calc non Af Amer 87 (*) >90 mL/min   GFR calc Af Amer >90  >90 mL/min   Comment: (NOTE)     The eGFR has been calculated using the CKD EPI equation.     This calculation has not been validated in all clinical situations.     eGFR's persistently <90 mL/min signify possible Chronic Kidney     Disease.  LIPID PANEL     Status: None   Collection Time    07/30/13  5:40 PM      Result Value Range   Cholesterol 136  0 - 200 mg/dL   Triglycerides 56  <161 mg/dL   HDL 58  >09 mg/dL   Total CHOL/HDL Ratio 2.3     VLDL 11  0 - 40 mg/dL   LDL Cholesterol 67  0 - 99 mg/dL   Comment:            Total Cholesterol/HDL:CHD Risk     Coronary Heart Disease Risk Table                         Men   Women      1/2 Average  Risk   3.4   3.3      Average Risk       5.0   4.4      2 X Average Risk   9.6   7.1      3 X Average Risk  23.4   11.0                Use the calculated Patient Ratio     above and the CHD Risk Table     to determine the patient's CHD Risk.                ATP III CLASSIFICATION (LDL):      <100     mg/dL   Optimal      604-540  mg/dL   Near or Above                        Optimal      130-159  mg/dL   Borderline      981-191  mg/dL   High      >478     mg/dL   Very High  PROTIME-INR     Status: Abnormal   Collection Time    07/30/13  5:40 PM      Result Value Range   Prothrombin Time 16.0 (*) 11.6 - 15.2 seconds   INR 1.31  0.00 - 1.49  APTT     Status: Abnormal   Collection Time    07/30/13  5:40 PM      Result Value Range   aPTT 94 (*) 24 - 37 seconds   Comment:            IF BASELINE aPTT IS ELEVATED,     SUGGEST PATIENT RISK ASSESSMENT     BE USED TO DETERMINE APPROPRIATE     ANTICOAGULANT THERAPY.  CK TOTAL AND CKMB     Status: Abnormal   Collection Time    07/30/13  5:40 PM      Result Value Range   Total CK  796 (*) 7 - 232 U/L   CK, MB 70.0 (*) 0.3 - 4.0 ng/mL   Comment: CRITICAL VALUE NOTED.  VALUE IS CONSISTENT WITH PREVIOUSLY REPORTED AND CALLED VALUE.   Relative Index 8.8 (*) 0.0 - 2.5  TROPONIN I     Status: Abnormal   Collection Time    07/30/13  5:40 PM      Result Value Range   Troponin I 18.19 (*) <0.30 ng/mL   Comment:            Due to the release kinetics of cTnI,     a negative result within the first hours     of the onset of symptoms does not rule out     myocardial infarction with certainty.     If myocardial infarction is still suspected,     repeat the test at appropriate intervals.     CRITICAL VALUE NOTED.  VALUE IS CONSISTENT WITH PREVIOUSLY REPORTED AND CALLED VALUE.  TYPE AND SCREEN     Status: None   Collection Time    07/30/13  5:40 PM      Result Value Range   ABO/RH(D) A POS     Antibody Screen NEG     Sample Expiration  08/02/2013     Unit Number [id number]     Blood Component Type RED CELLS,LR     Unit division 00     Status of Unit ISSUED     Transfusion Status OK TO TRANSFUSE     Crossmatch Result Compatible     Unit Number [id number]     Blood Component Type RED CELLS,LR     Unit division 00     Status of Unit ISSUED     Transfusion Status OK TO TRANSFUSE     Crossmatch Result Compatible      Dg Chest Port 1 View  07/30/2013   CLINICAL DATA:  Shortness of breath, cough, congestion.  EXAM: PORTABLE CHEST - 1 VIEW  COMPARISON:  07/13/2013  FINDINGS: There is hyperinflation of the lungs compatible with COPD. Bilateral perihilar and lower lobe airspace opacities. This could reflect edema or infection. Small bilateral effusions. Heart is borderline in size. No acute bony abnormality.  IMPRESSION: Bilateral lower lobe airspace opacities with effusions. FINDINGS could reflect edema/ CHF or pneumonia. Recommend clinical correlation.  COPD.   Electronically Signed   By: Charlett Nose   On: 07/30/2013 13:38    Review of Systems  Constitutional: Positive for malaise/fatigue and diaphoresis. Negative for fever, chills and weight loss.  HENT: Negative.   Eyes: Negative.   Respiratory: Positive for cough and shortness of breath. Negative for sputum production.   Cardiovascular: Positive for orthopnea. Negative for chest pain and leg swelling.  Gastrointestinal: Positive for nausea, vomiting and diarrhea.  Genitourinary: Negative.   Musculoskeletal: Negative.   Skin: Negative.   Neurological: Positive for weakness.  Endo/Heme/Allergies: Negative.   Psychiatric/Behavioral: Negative.    Blood pressure 102/72, pulse 98, temperature 98.3 F (36.8 C), temperature source Rectal, resp. rate 24, height 5\' 10"  (1.778 m), weight 61.236 kg (135 lb), SpO2 84.00%. Physical Exam  Constitutional: He is oriented to person, place, and time. He appears well-developed and well-nourished. No distress.  Neck: No JVD  present. No thyromegaly present.  Cardiovascular: Normal rate, regular rhythm, normal heart sounds and intact distal pulses.   No murmur heard. Respiratory: Effort normal. No respiratory distress. He has rales.  GI: Soft. Bowel sounds are normal. He exhibits no distension and no  mass. There is no tenderness.  Lymphadenopathy:    He has no cervical adenopathy.  Neurological: He is alert and oriented to person, place, and time.  Skin: Skin is warm and dry.  Psychiatric: He has a normal mood and affect.    CARDIAC CATH:   AssessProcedural Findings:  Hemodynamics  RA 12/10 mean of 8 mm Hg  RV 55/10 mm Hg  PA 53/29 mean of 41 mm Hg  PCWP 28/38 mean of 30 mmHg  LV 91/19 mm Hg  AO 90/65 mean 77 mm Hg  Oxygen saturations:  PA N/A  AO 99%  Coronary angiography:  Coronary dominance: left  Left mainstem: Moderate calcification. No significant disease.  Left anterior descending (LAD): 95% proximal LAD. The mid LAD is occluded with left to left collaterals.  Left circumflex (LCx): Dominant vessel. It is occluded proximally with left to left collaterals.  There is a ramus intermediate vessel that is moderate in size and without significant disease.  Right coronary artery (RCA): nondominant. Diffuse 90% disease proximally.  Left ventriculography: The LV is enlarged with inferior wall akinesis, severe global hypokinesis and overall markedly reduced systolic function. EF is estimated at 20%. There is moderate mitral insufficiency.  Final Conclusions:  1. Critical 3 vessel occlusive CAD  2. Severe LV dysfunction  3. Moderate MR.  4. Moderate pulmonary HTN with elevated LV filling pressures.  5. Cardiogenic shock.  Recommendations:  IABP placed for hemodynamic support with good augmentation. IV lasix given. Patient is not a candidate for PCI. Dr. Laneta Simmers consulted for CT surgery opinion concerning CABG.  [name] [facility]  07/30/2013, 5:59 PM   Assessment/Plan:  He has severe multivessel  coronary artery disease with occluded LAD and LCX with severe LV dysfunction and acute MI with cardiogenic shock and CHF. I think emergent CABG is the only option for trying to improve his situation. His operative risk is very high and I can't be sure that revascularization will improve his ventricular function and CHF. This MI probably started yesterday. I discussed the options of emergent CABG and palliative medical therapy with the patient and his family. He would like to proceed with surgery. I told him that his operative mortality is about 50%. I discussed the operative procedure with the patient and family including alternatives, benefits and risks; including but not limited to bleeding, blood transfusion, infection, stroke, myocardial infarction, graft failure, heart block requiring a permanent pacemaker, organ dysfunction, and death.  [place] Ouch understands and agrees to proceed.    BARTLE,BRYAN K 07/30/2013, 7:05 PM

## 2013-07-30 NOTE — ED Provider Notes (Signed)
CSN: [id number]     Arrival date & time 07/30/13  1228 History     First MD Initiated Contact with Patient 07/30/13 1241     Chief Complaint  Patient presents with  . Cough   (Consider location/radiation/quality/duration/timing/severity/associated sxs/prior Treatment) HPI Pt presents with 1 day of cough productive of green sputum, SOB and generalized weakness. +subjective fever and chills. No lower ext swelling or pain. Pt with history of lung CA s/p lobectomy. Pt has recurrent pleural effusion that require drainage.  Past Medical History  Diagnosis Date  . Dyslipidemia   . History of esophageal cancer     s/p transhiatal esophagogastrectomy  . Hypothyroidism   . Cerebral aneurysm     [place]. repair  1994  . History of lung cancer   . Pneumonia   . Cancer     Esophageal, adrenal gland, skin; lung  . Stroke 1995    denies residual  . 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 SBO (small bowel obstruction) 08/15/2012    History of small bowel obstruction (status post small bowel       resection, lysis of adhesions, incidental appendectomy, and repair       of left diaphragmatic hernia  . AAA (abdominal aortic aneurysm) 08/22/2012    3.9 cm by CT  - June 2013, stable    Past Surgical History  Procedure Laterality Date  . Left upper lobectomy with node dissection  02/24/2010    [place]  . Exploratory laparotomy, lysis of adhesions, reduce of incarcerated small bowel and colon from the chest, limited small bowel resection, incidental appendectomy, and then repair of diaphragmatic hernia with alloderm mesh  10/27/2009    [place]  . Left subclavian port- a-cath insertion  03/09/2004    [place]  . Exploratory laparotomy,exploratory thoracotomy for hemorrhage  02/11/2003    [place]  . Transhiatal esophagectomy with cholecystectomy, jejunostomy,  pyloroplasty and removal of right subclavian port-a- cath      [facility]  . Brain surgery      brain aneursyn  . Tonsillectomy    .  Cholecystectomy     Family History  Problem Relation Age of Onset  . Aneurysm Mother   . Kidney disease Father   . Heart disease Maternal Uncle   . Heart disease Paternal Uncle   . Aneurysm Maternal Grandmother    History  Substance Use Topics  . Smoking status: Former Smoker    Types: Cigarettes    Quit date: 12/10/2002  . Smokeless tobacco: Never Used  . Alcohol Use: No    Review of Systems  Constitutional: Positive for fever, chills and fatigue.  Respiratory: Positive for cough, shortness of breath and wheezing. Negative for chest tightness.   Cardiovascular: Negative for chest pain, palpitations and leg swelling.  Gastrointestinal: Negative for nausea, vomiting, abdominal pain and diarrhea.  Musculoskeletal: Negative for back pain.  Skin: Negative for rash and wound.  Neurological: Positive for weakness (generalized). Negative for dizziness, numbness and headaches.  All other systems reviewed and are negative.    Allergies  Review of patient's allergies indicates no known allergies.  Home Medications   Current Outpatient Rx  Name  Route  Sig  Dispense  Refill  . aspirin 81 MG EC tablet   Oral   Take 1 tablet (81 mg total) by mouth daily. Swallow whole.   30 tablet   12   . guaiFENesin (MUCINEX) 600 MG 12 hr tablet   Oral   Take  1,200 mg by mouth 2 (two) times daily as needed for congestion.          BP 102/72  Pulse 98  Temp(Src) 98.3 F (36.8 C) (Rectal)  Resp 24  Ht 5\' 10"  (1.778 m)  Wt 135 lb (61.236 kg)  BMI 19.37 kg/m2  SpO2 84% Physical Exam  Nursing note and vitals reviewed. Constitutional: He is oriented to person, place, and time. He appears well-developed and well-nourished. No distress.  HENT:  Head: Normocephalic and atraumatic.  Mouth/Throat: Oropharynx is clear and moist.  Eyes: EOM are normal. Pupils are equal, round, and reactive to light.  Neck: Normal range of motion. Neck supple.  Cardiovascular: Normal rate and regular  rhythm.   Pulmonary/Chest: No respiratory distress. He has no wheezes. He has no rales.  Increased resp effort. Decreased BS bl bases  Abdominal: Soft. Bowel sounds are normal. He exhibits mass (Pulsitile abdominal mass. Nontender. Consistent with knwon AAA). He exhibits no distension. There is no tenderness. There is no rebound and no guarding.  Musculoskeletal: Normal range of motion. He exhibits no edema and no tenderness.  Neurological: He is alert and oriented to person, place, and time.  Moves all ext without deficit, sensation intact  Skin: Skin is warm and dry. No rash noted. No erythema.  Psychiatric: He has a normal mood and affect. His behavior is normal.    ED Course   Procedures (including critical care time)  Labs Reviewed  CBC WITH DIFFERENTIAL - Abnormal; Notable for the following:    RDW 16.0 (*)    Monocytes Relative 16 (*)    Monocytes Absolute 1.1 (*)    All other components within normal limits  COMPREHENSIVE METABOLIC PANEL - Abnormal; Notable for the following:    Glucose, Bld 144 (*)    BUN 28 (*)    AST 159 (*)    ALT 74 (*)    Alkaline Phosphatase 155 (*)    GFR calc non Af Amer 71 (*)    GFR calc Af Amer 82 (*)    All other components within normal limits  TROPONIN I - Abnormal; Notable for the following:    Troponin I 17.75 (*)    All other components within normal limits  PRO B NATRIURETIC PEPTIDE - Abnormal; Notable for the following:    Pro B Natriuretic peptide (BNP) 10512.0 (*)    All other components within normal limits  URINALYSIS, ROUTINE W REFLEX MICROSCOPIC - Abnormal; Notable for the following:    Color, Urine AMBER (*)    Bilirubin Urine SMALL (*)    All other components within normal limits  CK TOTAL AND CKMB - Abnormal; Notable for the following:    Total CK 965 (*)    CK, MB 91.2 (*)    Relative Index 9.5 (*)    All other components within normal limits  TROPONIN I - Abnormal; Notable for the following:    Troponin I 18.73 (*)     All other components within normal limits  CULTURE, BLOOD (ROUTINE X 2)  CULTURE, BLOOD (ROUTINE X 2)  PROTIME-INR  APTT  TROPONIN I  TROPONIN I  HEPARIN LEVEL (UNFRACTIONATED)  CG4 I-STAT (LACTIC ACID)   Dg Chest Port 1 View  07/30/2013   CLINICAL DATA:  Shortness of breath, cough, congestion.  EXAM: PORTABLE CHEST - 1 VIEW  COMPARISON:  07/13/2013  FINDINGS: There is hyperinflation of the lungs compatible with COPD. Bilateral perihilar and lower lobe airspace opacities. This could reflect edema or  infection. Small bilateral effusions. Heart is borderline in size. No acute bony abnormality.  IMPRESSION: Bilateral lower lobe airspace opacities with effusions. FINDINGS could reflect edema/ CHF or pneumonia. Recommend clinical correlation.  COPD.   Electronically Signed   By: Charlett Nose   On: 07/30/2013 13:38   1. Non-STEMI (non-ST elevated myocardial infarction)   2. Pulmonary edema cardiac cause     Date: 07/30/2013  Rate: 91  Rhythm: sinus tachycardia  QRS Axis: normal  Intervals: normal  ST/T Wave abnormalities: nonspecific T wave changes  Conduction Disutrbances:incomplete LBBB  Narrative Interpretation:   Old EKG Reviewed: changes noted Inverted T-waves in inferior and lateral leads. QRS mildly widened by comparison   MDM  On requestioning, pt states was playing golf walking entire round when he became SOB. No chest pain at any point.   Discussed with Cardiology who will see in ED and admit. I have ordered blood cx and abx though primarily believe symptoms to be cardiac in origin.   Pt to be taken to cath lab by Dr Patty Sermons.   Loren Racer, MD 07/30/13 [phone]

## 2013-07-30 NOTE — Anesthesia Preprocedure Evaluation (Signed)
Anesthesia Evaluation  Patient identified by MRN, date of birth, ID band Patient awake    Reviewed: Allergy & Precautions, H&P , NPO status , Patient's Chart, lab work & pertinent test results  Airway Mallampati: I TM Distance: >3 FB Neck ROM: Full    Dental   Pulmonary  S/P lobectomy         Cardiovascular + Past MI and + Peripheral Vascular Disease     Neuro/Psych    GI/Hepatic S/P esophagectomy   Endo/Other  Hypothyroidism   Renal/GU      Musculoskeletal   Abdominal   Peds  Hematology   Anesthesia Other Findings   Reproductive/Obstetrics                           Anesthesia Physical Anesthesia Plan  ASA: IV  Anesthesia Plan: General   Post-op Pain Management:    Induction: Intravenous, Rapid sequence and Cricoid pressure planned  Airway Management Planned: Oral ETT  Additional Equipment: Arterial line, PA Cath and CVP  Intra-op Plan:   Post-operative Plan: Post-operative intubation/ventilation  Informed Consent: I have reviewed the patients History and Physical, chart, labs and discussed the procedure including the risks, benefits and alternatives for the proposed anesthesia with the patient or authorized representative who has indicated his/her understanding and acceptance.     Plan Discussed with: CRNA and Surgeon  Anesthesia Plan Comments: (Atempted bilat IJ and L Subclavian cannulatuins. All successful but unable to advance wire. PA catheter place via femoral vein by Dr Laneta Simmers.)        Anesthesia Quick Evaluation

## 2013-07-30 NOTE — ED Notes (Signed)
Cardiology at bedside.

## 2013-07-30 NOTE — ED Notes (Signed)
Dr. Patty Sermons at bedside.   Cardiac Cath procedure explained to pt.  Pt to cath lab at this time

## 2013-07-31 ENCOUNTER — Inpatient Hospital Stay (HOSPITAL_COMMUNITY): Payer: Medicare Other

## 2013-07-31 ENCOUNTER — Encounter (HOSPITAL_COMMUNITY): Payer: Self-pay | Admitting: Surgery

## 2013-07-31 DIAGNOSIS — I059 Rheumatic mitral valve disease, unspecified: Secondary | ICD-10-CM

## 2013-07-31 LAB — POCT I-STAT 4, (NA,K, GLUC, HGB,HCT)
HCT: 33 % — ABNORMAL LOW (ref 39.0–52.0)
Hemoglobin: 11.2 g/dL — ABNORMAL LOW (ref 13.0–17.0)
Potassium: 3.4 mEq/L — ABNORMAL LOW (ref 3.5–5.1)
Sodium: 141 mEq/L (ref 135–145)

## 2013-07-31 LAB — APTT: aPTT: 37 seconds (ref 24–37)

## 2013-07-31 LAB — CBC
HCT: 30.3 % — ABNORMAL LOW (ref 39.0–52.0)
HCT: 29.1 % — ABNORMAL LOW (ref 39.0–52.0)
HCT: 26.9 % — ABNORMAL LOW (ref 39.0–52.0)
Hemoglobin: 10.3 g/dL — ABNORMAL LOW (ref 13.0–17.0)
Hemoglobin: 9.8 g/dL — ABNORMAL LOW (ref 13.0–17.0)
MCH: 30.7 pg (ref 26.0–34.0)
MCV: 90.2 fL (ref 78.0–100.0)
MCV: 90.9 fL (ref 78.0–100.0)
MCV: 90 fL (ref 78.0–100.0)
RBC: 3.36 MIL/uL — ABNORMAL LOW (ref 4.22–5.81)
RBC: 3.2 MIL/uL — ABNORMAL LOW (ref 4.22–5.81)
RBC: 2.99 MIL/uL — ABNORMAL LOW (ref 4.22–5.81)
RDW: 16 % — ABNORMAL HIGH (ref 11.5–15.5)
RDW: 15.8 % — ABNORMAL HIGH (ref 11.5–15.5)
WBC: 10 10*3/uL (ref 4.0–10.5)
WBC: 8.9 10*3/uL (ref 4.0–10.5)
WBC: 9.6 10*3/uL (ref 4.0–10.5)

## 2013-07-31 LAB — POCT I-STAT, CHEM 8
Chloride: 107 mEq/L (ref 96–112)
Creatinine, Ser: 0.9 mg/dL (ref 0.50–1.35)
Hemoglobin: 9.5 g/dL — ABNORMAL LOW (ref 13.0–17.0)
Potassium: 4.2 mEq/L (ref 3.5–5.1)
Sodium: 140 mEq/L (ref 135–145)

## 2013-07-31 LAB — COMPREHENSIVE METABOLIC PANEL
BUN: 21 mg/dL (ref 6–23)
CO2: 25 mEq/L (ref 19–32)
Calcium: 7.8 mg/dL — ABNORMAL LOW (ref 8.4–10.5)
Chloride: 108 mEq/L (ref 96–112)
Creatinine, Ser: 0.76 mg/dL (ref 0.50–1.35)
GFR calc Af Amer: >90 mL/min (ref >90)
GFR calc non Af Amer: 90 mL/min — ABNORMAL LOW (ref >90)
Total Bilirubin: 0.8 mg/dL (ref 0.3–1.2)

## 2013-07-31 LAB — POCT I-STAT 3, ART BLOOD GAS (G3+)
Acid-base deficit: 2 mmol/L (ref 0.0–2.0)
Acid-base deficit: 3 mmol/L — ABNORMAL HIGH (ref 0.0–2.0)
Bicarbonate: 22.7 mEq/L (ref 20.0–24.0)
Bicarbonate: 22.6 mEq/L (ref 20.0–24.0)
O2 Saturation: 96 %
O2 Saturation: 97 %
O2 Saturation: 88 %
TCO2: 24 mmol/L (ref 0–100)
TCO2: 24 mmol/L (ref 0–100)
pCO2 arterial: 41.2 mmHg (ref 35.0–45.0)
pCO2 arterial: 44.8 mmHg (ref 35.0–45.0)
pH, Arterial: 7.318 — ABNORMAL LOW (ref 7.350–7.450)
pO2, Arterial: 100 mmHg (ref 80.0–100.0)
pO2, Arterial: 62 mmHg — ABNORMAL LOW (ref 80.0–100.0)

## 2013-07-31 LAB — GLUCOSE, CAPILLARY
Glucose-Capillary: 94 mg/dL (ref 70–99)
Glucose-Capillary: 121 mg/dL — ABNORMAL HIGH (ref 70–99)

## 2013-07-31 LAB — PREPARE PLATELET PHERESIS

## 2013-07-31 LAB — LIPID PANEL
Cholesterol: 92 mg/dL (ref 0–200)
HDL: 35 mg/dL — ABNORMAL LOW (ref >39)
Total CHOL/HDL Ratio: 2.6 RATIO

## 2013-07-31 LAB — CREATININE, SERUM
GFR calc Af Amer: >90 mL/min (ref >90)
GFR calc non Af Amer: 88 mL/min — ABNORMAL LOW (ref >90)

## 2013-07-31 LAB — TSH: TSH: 1.766 u[IU]/mL (ref 0.350–4.500)

## 2013-07-31 LAB — HEMOGLOBIN A1C: Mean Plasma Glucose: 140 mg/dL — ABNORMAL HIGH (ref <117)

## 2013-07-31 LAB — MAGNESIUM: Magnesium: 2.6 mg/dL — ABNORMAL HIGH (ref 1.5–2.5)

## 2013-07-31 MED ORDER — MORPHINE SULFATE 2 MG/ML IJ SOLN
2.0000 mg | INTRAMUSCULAR | Status: DC | PRN
  Administered 2013-07-31 – 2013-08-01 (×6): 2 mg via INTRAVENOUS
  Administered 2013-08-01: 4 mg via INTRAVENOUS
  Administered 2013-08-01 – 2013-08-02 (×5): 2 mg via INTRAVENOUS
  Filled 2013-07-31 (×6): qty 1
  Filled 2013-07-31: qty 2
  Filled 2013-07-31 (×4): qty 1

## 2013-07-31 MED ORDER — OXYCODONE HCL 5 MG PO TABS: 5.0000 mg | ORAL_TABLET | ORAL | Status: DC | PRN

## 2013-07-31 MED ORDER — MORPHINE SULFATE 2 MG/ML IJ SOLN
1.0000 mg | INTRAMUSCULAR | Status: AC | PRN
  Filled 2013-07-31: qty 1

## 2013-07-31 MED ORDER — METOCLOPRAMIDE HCL 5 MG/ML IJ SOLN
10.0000 mg | Freq: Four times a day (QID) | INTRAMUSCULAR | Status: AC
  Administered 2013-07-31 (×4): 10 mg via INTRAVENOUS
  Filled 2013-07-31 (×4): qty 2

## 2013-07-31 MED ORDER — SODIUM CHLORIDE 0.9 % IV SOLN
INTRAVENOUS | Status: DC
  Administered 2013-07-31: 11 [IU]/h via INTRAVENOUS
  Filled 2013-07-31 (×2): qty 1

## 2013-07-31 MED ORDER — BISACODYL 10 MG RE SUPP: 10.0000 mg | Freq: Every day | RECTAL | Status: DC

## 2013-07-31 MED ORDER — DEXTROSE 50 % IV SOLN
INTRAVENOUS | Status: AC
  Administered 2013-07-31: 13 mL via INTRAVENOUS
  Filled 2013-07-31: qty 50

## 2013-07-31 MED ORDER — ACETAMINOPHEN 500 MG PO TABS
1000.0000 mg | ORAL_TABLET | Freq: Four times a day (QID) | ORAL | Status: AC
  Administered 2013-07-31 – 2013-08-04 (×13): 1000 mg via ORAL
  Filled 2013-07-31 (×19): qty 2

## 2013-07-31 MED ORDER — SODIUM CHLORIDE 0.9 % IJ SOLN: 3.0000 mL | INTRAMUSCULAR | Status: DC | PRN

## 2013-07-31 MED ORDER — ASPIRIN EC 325 MG PO TBEC
325.0000 mg | DELAYED_RELEASE_TABLET | Freq: Every day | ORAL | Status: DC
  Administered 2013-08-01 – 2013-08-11 (×11): 325 mg via ORAL
  Filled 2013-07-31 (×12): qty 1

## 2013-07-31 MED ORDER — ALBUMIN HUMAN 5 % IV SOLN
INTRAVENOUS | Status: DC | PRN
  Administered 2013-07-31: via INTRAVENOUS

## 2013-07-31 MED ORDER — SODIUM CHLORIDE 0.9 % IJ SOLN
3.0000 mL | Freq: Two times a day (BID) | INTRAMUSCULAR | Status: DC
  Administered 2013-07-31 – 2013-08-06 (×10): 3 mL via INTRAVENOUS

## 2013-07-31 MED ORDER — GLUCERNA SHAKE PO LIQD
237.0000 mL | ORAL | Status: DC
  Administered 2013-07-31 – 2013-08-02 (×2): 237 mL via ORAL

## 2013-07-31 MED ORDER — METOPROLOL TARTRATE 12.5 MG HALF TABLET
12.5000 mg | ORAL_TABLET | Freq: Two times a day (BID) | ORAL | Status: DC
  Filled 2013-07-31 (×2): qty 1

## 2013-07-31 MED ORDER — ATORVASTATIN CALCIUM 20 MG PO TABS
20.0000 mg | ORAL_TABLET | Freq: Every day | ORAL | Status: DC
  Administered 2013-07-31: 20 mg via ORAL
  Filled 2013-07-31 (×2): qty 1

## 2013-07-31 MED ORDER — ONDANSETRON HCL 4 MG/2ML IJ SOLN
4.0000 mg | Freq: Four times a day (QID) | INTRAMUSCULAR | Status: DC | PRN
  Administered 2013-07-31 – 2013-08-01 (×3): 4 mg via INTRAVENOUS
  Filled 2013-07-31 (×3): qty 2

## 2013-07-31 MED ORDER — INSULIN REGULAR BOLUS VIA INFUSION
0.0000 [IU] | Freq: Three times a day (TID) | INTRAVENOUS | Status: DC
  Administered 2013-07-31: 3.8 [IU] via INTRAVENOUS
  Filled 2013-07-31: qty 10

## 2013-07-31 MED ORDER — ONDANSETRON HCL 4 MG/2ML IJ SOLN: 4.0000 mg | Freq: Four times a day (QID) | INTRAMUSCULAR | Status: DC | PRN

## 2013-07-31 MED ORDER — POTASSIUM CHLORIDE 10 MEQ/50ML IV SOLN
10.0000 meq | INTRAVENOUS | Status: AC
  Administered 2013-07-31 (×3): 10 meq via INTRAVENOUS

## 2013-07-31 MED ORDER — ACETAMINOPHEN 650 MG RE SUPP
650.0000 mg | Freq: Once | RECTAL | Status: AC
  Administered 2013-07-31: 650 mg via RECTAL

## 2013-07-31 MED ORDER — PANTOPRAZOLE SODIUM 40 MG PO TBEC
40.0000 mg | DELAYED_RELEASE_TABLET | Freq: Every day | ORAL | Status: DC
  Administered 2013-08-01 – 2013-08-11 (×11): 40 mg via ORAL
  Filled 2013-07-31 (×11): qty 1

## 2013-07-31 MED ORDER — ASPIRIN 81 MG PO CHEW: 324.0000 mg | CHEWABLE_TABLET | ORAL | Status: DC

## 2013-07-31 MED ORDER — ENSURE COMPLETE PO LIQD: 237.0000 mL | Freq: Every day | ORAL | Status: DC

## 2013-07-31 MED ORDER — DOCUSATE SODIUM 100 MG PO CAPS
200.0000 mg | ORAL_CAPSULE | Freq: Every day | ORAL | Status: DC
  Administered 2013-08-01 – 2013-08-11 (×9): 200 mg via ORAL
  Filled 2013-07-31 (×11): qty 2

## 2013-07-31 MED ORDER — LACTATED RINGERS IV SOLN: 500.0000 mL | Freq: Once | INTRAVENOUS | Status: AC | PRN

## 2013-07-31 MED ORDER — ACETAMINOPHEN 160 MG/5ML PO SOLN
1000.0000 mg | Freq: Four times a day (QID) | ORAL | Status: AC
  Administered 2013-07-31: 1000 mg
  Filled 2013-07-31: qty 40.6

## 2013-07-31 MED ORDER — VANCOMYCIN HCL IN DEXTROSE 1-5 GM/200ML-% IV SOLN
1000.0000 mg | Freq: Once | INTRAVENOUS | Status: AC
  Administered 2013-07-31: 1000 mg via INTRAVENOUS
  Filled 2013-07-31: qty 200

## 2013-07-31 MED ORDER — DEXMEDETOMIDINE HCL IN NACL 200 MCG/50ML IV SOLN
0.1000 ug/kg/h | INTRAVENOUS | Status: DC
  Filled 2013-07-31: qty 50

## 2013-07-31 MED ORDER — PHENYLEPHRINE HCL 10 MG/ML IJ SOLN
0.0000 ug/min | INTRAMUSCULAR | Status: DC
  Administered 2013-07-31: 50 ug/min via INTRAVENOUS
  Administered 2013-08-01: 28 ug/min via INTRAVENOUS
  Administered 2013-08-01: 50 ug/min via INTRAVENOUS
  Administered 2013-08-02: 20 ug/min via INTRAVENOUS
  Administered 2013-08-04: 5 ug/min via INTRAVENOUS
  Filled 2013-07-31 (×7): qty 2

## 2013-07-31 MED ORDER — DOPAMINE-DEXTROSE 3.2-5 MG/ML-% IV SOLN
0.0000 ug/kg/min | INTRAVENOUS | Status: DC
  Administered 2013-08-01: 5 ug/kg/min via INTRAVENOUS
  Administered 2013-08-03: 2 ug/kg/min via INTRAVENOUS
  Filled 2013-07-31 (×2): qty 250

## 2013-07-31 MED ORDER — NITROGLYCERIN 0.4 MG SL SUBL: 0.4000 mg | SUBLINGUAL_TABLET | SUBLINGUAL | Status: DC | PRN

## 2013-07-31 MED ORDER — ACETAMINOPHEN 325 MG PO TABS: 650.0000 mg | ORAL_TABLET | ORAL | Status: DC | PRN

## 2013-07-31 MED ORDER — ALPRAZOLAM 0.25 MG PO TABS: 0.2500 mg | ORAL_TABLET | Freq: Two times a day (BID) | ORAL | Status: DC | PRN

## 2013-07-31 MED ORDER — ASPIRIN EC 81 MG PO TBEC: 81.0000 mg | DELAYED_RELEASE_TABLET | Freq: Every day | ORAL | Status: DC

## 2013-07-31 MED ORDER — VANCOMYCIN HCL 10 G IV SOLR: 1250.0000 mg | INTRAVENOUS | Status: DC

## 2013-07-31 MED ORDER — DIAZEPAM 5 MG PO TABS: 5.0000 mg | ORAL_TABLET | ORAL | Status: DC

## 2013-07-31 MED ORDER — SODIUM CHLORIDE 0.9 % IV SOLN: INTRAVENOUS | Status: DC

## 2013-07-31 MED ORDER — FAMOTIDINE IN NACL 20-0.9 MG/50ML-% IV SOLN
20.0000 mg | Freq: Two times a day (BID) | INTRAVENOUS | Status: AC
  Administered 2013-07-31 (×2): 20 mg via INTRAVENOUS
  Filled 2013-07-31: qty 50

## 2013-07-31 MED ORDER — LACTATED RINGERS IV SOLN
INTRAVENOUS | Status: DC
  Administered 2013-08-01: 06:00:00 via INTRAVENOUS
  Administered 2013-08-04: 20 mL/h via INTRAVENOUS

## 2013-07-31 MED ORDER — ASPIRIN 81 MG PO CHEW: 324.0000 mg | CHEWABLE_TABLET | Freq: Every day | ORAL | Status: DC

## 2013-07-31 MED ORDER — METOPROLOL TARTRATE 25 MG/10 ML ORAL SUSPENSION
12.5000 mg | Freq: Two times a day (BID) | ORAL | Status: DC
  Filled 2013-07-31 (×2): qty 5

## 2013-07-31 MED ORDER — NITROGLYCERIN IN D5W 200-5 MCG/ML-% IV SOLN: 0.0000 ug/min | INTRAVENOUS | Status: DC

## 2013-07-31 MED ORDER — ACETAMINOPHEN 160 MG/5ML PO SOLN: 650.0000 mg | Freq: Once | ORAL | Status: AC

## 2013-07-31 MED ORDER — BISACODYL 5 MG PO TBEC
10.0000 mg | DELAYED_RELEASE_TABLET | Freq: Every day | ORAL | Status: DC
  Administered 2013-08-01 – 2013-08-10 (×8): 10 mg via ORAL
  Filled 2013-07-31 (×8): qty 2

## 2013-07-31 MED ORDER — ALBUMIN HUMAN 5 % IV SOLN
250.0000 mL | INTRAVENOUS | Status: AC | PRN
  Administered 2013-07-31 (×3): 250 mL via INTRAVENOUS
  Filled 2013-07-31 (×2): qty 250

## 2013-07-31 MED ORDER — DEXTROSE 5 % IV SOLN: 1.5000 g | INTRAVENOUS | Status: DC

## 2013-07-31 MED ORDER — MAGNESIUM SULFATE 40 MG/ML IJ SOLN
4.0000 g | Freq: Once | INTRAMUSCULAR | Status: AC
  Administered 2013-07-31: 4 g via INTRAVENOUS
  Filled 2013-07-31: qty 100

## 2013-07-31 MED ORDER — SODIUM CHLORIDE 0.9 % IV SOLN: 250.0000 mL | INTRAVENOUS | Status: DC

## 2013-07-31 MED ORDER — DEXTROSE 5 % IV SOLN
1.5000 g | Freq: Two times a day (BID) | INTRAVENOUS | Status: AC
  Administered 2013-07-31 – 2013-08-01 (×4): 1.5 g via INTRAVENOUS
  Filled 2013-07-31 (×4): qty 1.5

## 2013-07-31 MED ORDER — MIDAZOLAM HCL 2 MG/2ML IJ SOLN: 2.0000 mg | INTRAMUSCULAR | Status: DC | PRN

## 2013-07-31 MED ORDER — SODIUM CHLORIDE 0.45 % IV SOLN
INTRAVENOUS | Status: DC
  Administered 2013-07-31: 04:00:00 via INTRAVENOUS

## 2013-07-31 MED ORDER — METOPROLOL TARTRATE 1 MG/ML IV SOLN: 2.5000 mg | INTRAVENOUS | Status: DC | PRN

## 2013-07-31 MED FILL — Heparin Sodium (Porcine) Inj 1000 Unit/ML: INTRAMUSCULAR | Qty: 30 | Status: AC

## 2013-07-31 MED FILL — Albumin, Human Inj 5%: INTRAVENOUS | Qty: 250 | Status: AC

## 2013-07-31 MED FILL — Sodium Chloride Irrigation Soln 0.9%: Qty: 3000 | Status: AC

## 2013-07-31 MED FILL — Lidocaine HCl IV Inj 20 MG/ML: INTRAVENOUS | Qty: 10 | Status: AC

## 2013-07-31 MED FILL — Electrolyte-R (PH 7.4) Solution: INTRAVENOUS | Qty: 5000 | Status: AC

## 2013-07-31 MED FILL — Mannitol IV Soln 20%: INTRAVENOUS | Qty: 500 | Status: AC

## 2013-07-31 MED FILL — Sodium Bicarbonate IV Soln 8.4%: INTRAVENOUS | Qty: 50 | Status: AC

## 2013-07-31 MED FILL — Calcium Chloride Inj 10%: INTRAVENOUS | Qty: 10 | Status: AC

## 2013-07-31 MED FILL — Heparin Sodium (Porcine) Inj 1000 Unit/ML: INTRAMUSCULAR | Qty: 10 | Status: AC

## 2013-07-31 NOTE — Progress Notes (Signed)
T. CTS p.m. Rounds  Status post emergency CABG x2 for acute MI, reduced LV function Patient extubated today with stable pulmonary function Stable hemodynamics on balloon pump support 1:2, low-dose dopamine P.m. labs reviewed and satisfactory Plan--remove intra-aortic balloon pump tomorrow

## 2013-07-31 NOTE — Transfer of Care (Signed)
Immediate Anesthesia Transfer of Care Note  Patient: [place]  Procedure(s) Performed: Procedure(s): CORONARY ARTERY BYPASS GRAFTING (CABG) times two on pump using left internal mammary artery and left greater saphenous vein via endovein harvest. (N/A)  Patient Location: SICU  Anesthesia Type:General  Level of Consciousness: sedated, unresponsive and Patient remains intubated per anesthesia plan  Airway & Oxygen Therapy: Patient remains intubated per anesthesia plan and Patient placed on Ventilator (see vital sign flow sheet for setting)  Post-op Assessment: Post -op Vital signs reviewed and stable  Post vital signs: Reviewed and stable  Complications: No apparent anesthesia complications

## 2013-07-31 NOTE — Clinical Documentation Improvement (Signed)
THIS DOCUMENT IS NOT A PERMANENT PART OF THE MEDICAL RECORD  Please update your documentation with the medical record to reflect your response to this query. If you need help knowing how to do this please call [phone].  07/31/13  Dear [facility],  In a better effort to capture your patient's severity of illness, reflect appropriate length of stay and utilization of resources, a review of the patient medical record has revealed the following indicators the diagnosis of Heart Failure.    Based on your clinical judgment, please clarify and document in a progress note and/or discharge summary the clinical condition associated with the following supporting information:    Possible Clinical Conditions?  Acute Systolic Congestive Heart Failure Acute Diastolic Congestive Heart Failure Acute Systolic & Diastolic Congestive Heart Failure Acute on Chronic Systolic Congestive Heart Failure Acute on Chronic Diastolic Congestive Heart Failure Acute on Chronic Systolic & Diastolic  Congestive Heart Failure Acute Pulmonary Edema Other Condition Cannot Clinically Determine    Risk Factors: Pulmonary edema, cardiac cause-diurese as blood pressure will allow; [place] [address]; is in severe heart failure by CXR noted per 8/21 Cardiology progress note.    Reviewed:   Thank You,  Marciano Sequin,  Clinical Documentation Specialist: [phone] [facility] [place]

## 2013-07-31 NOTE — Anesthesia Postprocedure Evaluation (Signed)
Anesthesia Post Note  Patient: [name]  Procedure(s) Performed: Procedure(s) (LRB): CORONARY ARTERY BYPASS GRAFTING (CABG) times two on pump using left internal mammary artery and left greater saphenous vein via endovein harvest. (N/A)  Anesthesia type: General  Patient location: ICU  Post pain: Pain level controlled  Post assessment: Post-op Vital signs reviewed  Last Vitals:  Filed Vitals:   07/31/13 0100  BP:   Pulse: 107  Temp: 36 C  Resp: 12    Post vital signs: stable  Level of consciousness: Patient remains intubated per anesthesia plan  Complications: No apparent anesthesia complications

## 2013-07-31 NOTE — Progress Notes (Signed)
Utilization review completed. Amerah Puleo, RN, BSN. 

## 2013-07-31 NOTE — Clinical Documentation Improvement (Signed)
THIS DOCUMENT IS NOT A PERMANENT PART OF THE MEDICAL RECORD  Please update your documentation with the medical record to reflect your response to this query. If you need help knowing how to do this please call [phone].  07/31/13  Dear Dr. Laneta Simmers [place]  In an effort to better capture your patient's severity of illness, reflect appropriate length of stay and utilization of resources, a review of the patient medical record has revealed the following indicators.    Based on your clinical judgment, please clarify and document in a progress note and/or discharge summary the clinical condition associated with the following supporting information:   Possible Clinical Conditions?   " Expected Acute Blood Loss Anemia  " Acute Blood Loss Anemia  " Acute on chronic blood loss anemia  " Precipitous drop in Hematocrit  " Other Condition  " Cannot Clinically Determine     Risk Factors:  EBL=800 ml per 8/21 Anesthesia record.  Diagnostics: H&H on 8/21:  13.9/39.7 H&H on 8/21:   8.4/24.6 H&H on 8/22:   9.8/29.1  IV fluids / plasma expanders: Per 8/21 Anesthesia record: Plts: 190 ml Cell saver: 400 ml Albumin 5%: 250 ml 0.9% NaCl: 500 ml  Serial H&H monitoring Medications (Fe, Procrit)  Reviewed:  no additional documentation provided  Thank You,  Marciano Sequin,  Clinical Documentation Specialist: [phone] [facility] Information Management [place]

## 2013-07-31 NOTE — Progress Notes (Addendum)
1 Day Post-Op Procedure(s) (LRB): CORONARY ARTERY BYPASS GRAFTING (CABG) times two on pump using left internal mammary artery and left greater saphenous vein via endovein harvest. (N/A) Subjective: Intubated but responds  Objective: Vital signs in last 24 hours: Temp:  [96.6 F (35.9 C)-99.5 F (37.5 C)] 99.5 F (37.5 C) (08/22 0745) Pulse Rate:  [88-107] 90 (08/22 0752) Cardiac Rhythm:  [-] Normal sinus rhythm;Bundle branch block (08/22 0745) Resp:  [0-30] 13 (08/22 0752) BP: (91-105)/(40-78) 97/45 mmHg (08/22 0420) SpO2:  [84 %-100 %] 100 % (08/22 0752) Arterial Line BP: (82-106)/(35-58) 98/51 mmHg (08/22 0745) FiO2 (%):  [50 %] 50 % (08/22 0752) Weight:  [61 kg (134 lb 7.7 oz)-61.236 kg (135 lb)] 61 kg (134 lb 7.7 oz) (08/22 0100)  Hemodynamic parameters for last 24 hours: PAP: (21-47)/(11-25) 41/25 mmHg CO:  [3.1 L/min-3.4 L/min] 3.1 L/min CI:  [1.8 L/min/m2-1.9 L/min/m2] 1.8 L/min/m2  Intake/Output from previous day: 08/21 0701 - 08/22 0700 In: 5070.9 [I.V.:2980.9; Blood:590; IV Piggyback:1500] Out: 4600 [Urine:3075; Blood:800; Chest Tube:725] Intake/Output this shift: Total I/O In: 1.2 [I.V.:1.2] Out: -   General appearance: color good. Neurologic: intact Heart: regular rate and rhythm and no murmur Lungs: rhonchi bilaterally Extremities: extremities normal, atraumatic, no cyanosis or edema Wound: dressing dry  Lab Results:  Recent Labs  07/31/13 0100 07/31/13 0102 07/31/13 0500  WBC 10.0  --  8.9  HGB 10.3* 11.2* 9.8*  HCT 30.3* 33.0* 29.1*  PLT 131*  --  120*   BMET:  Recent Labs  07/30/13 1740 07/31/13 0102 07/31/13 0500  NA 137 141 141  K 3.7 3.4* 4.3  CL 105  --  108  CO2 21  --  25  GLUCOSE 111* 138* 134*  BUN 25*  --  21  CREATININE 0.81  --  0.76  CALCIUM 8.8  --  7.8*    PT/INR:  Recent Labs  07/31/13 0100  LABPROT 17.9*  INR 1.52*   ABG    Component Value Date/Time   PHART 7.369 07/31/2013 0101   HCO3 24.0 07/31/2013 0101   TCO2 25 07/31/2013 0101   ACIDBASEDEF 2.0 07/31/2013 0101   O2SAT 96.0 07/31/2013 0101   CBG (last 3)   Recent Labs  07/31/13 0237 07/31/13 0342 07/31/13 0500  GLUCAP 94 121* 128*   CXR: stable chronic changes from prior XRT and lobectomy. ECG:  NSR, nonspecific intraventricular block  Assessment/Plan: S/p Acute MI with cardiogenic shock Low EF 20% preop S/P Procedure(s) (LRB): CORONARY ARTERY BYPASS GRAFTING (CABG) times two on pump using left internal mammary artery and left greater saphenous vein via endovein harvest. (N/A) Hemodynamics are satisfactory. Continue dopamine. Turn IABP to 1:2. I suspect he will continue to have low EF since anterior wall was all scar and thinned out from old MI. Hopefully revascularization of the dominant LCX will help him. Wean vent to extubate as tolerated.   LOS: 1 day    Raenette Sakata K 07/31/2013

## 2013-07-31 NOTE — Plan of Care (Signed)
Problem: Phase I Progression Outcomes Goal: Patient status is OR emergent or Short Stay Outcome: Completed/Met Date Met:  07/31/13 OR emergent

## 2013-07-31 NOTE — Progress Notes (Signed)
INITIAL NUTRITION ASSESSMENT  DOCUMENTATION CODES Per approved criteria  -Not Applicable   INTERVENTION: Add Ensure Complete po daily, each supplement provides 350 kcal and 13 grams of protein. RD to continue to follow nutrition care plan.  NUTRITION DIAGNOSIS: Increased nutrient needs related to post-op healing as evidenced by estimated needs.  Goal: Intake to meet >90% of estimated nutrition needs.  Monitor:  weight trends, lab trends, I/O's, PO intake, supplement tolerance  Reason for Assessment: Progression Rounds  72 y.o. male  Admitting Dx: NSTEMI (non-ST elevated myocardial infarction)  ASSESSMENT: PMHx significant for esophagogastrectomy s/p lung and esophageal CA and PNA. Admitted with SOB, LE weakness, n/v/d. Work-up reveals NSTEMI.  Underwent emergent CABG x 2 on 8/21. Extubated this morning. Patient is currently resting and wife is not at bedside. RD drawn to patient 2/2 significant past medical history discussed during progression rounds.  Advanced to Cardiac Diet this morning, has yet to receive meal tray.  Height: Ht Readings from Last 1 Encounters:  07/31/13 5\' 10"  (1.778 m)    Weight: Wt Readings from Last 1 Encounters:  07/31/13 134 lb 7.7 oz (61 kg)    Ideal Body Weight: 166 lb  % Ideal Body Weight: 81%  Wt Readings from Last 10 Encounters:  07/31/13 134 lb 7.7 oz (61 kg)  07/31/13 134 lb 7.7 oz (61 kg)  07/31/13 134 lb 7.7 oz (61 kg)  07/16/13 134 lb (60.782 kg)  07/13/13 134 lb (60.782 kg)  07/07/13 134 lb (60.782 kg)  07/02/13 134 lb (60.782 kg)  08/22/12 132 lb 8 oz (60.102 kg)  05/28/12 138 lb (62.596 kg)  12/10/11 128 lb 15.5 oz (58.5 kg)    Usual Body Weight: 132 - 134 lb  % Usual Body Weight: 100%  BMI:  Body mass index is 19.3 kg/(m^2). WNL  Estimated Nutritional Needs: Kcal: 1650 - 1800 Protein: 70 - 80 g Fluid: 1.6 - 1.8 liters daily  Skin:  Chest incision L leg incision R groin incision  Diet Order:  Cardiac  EDUCATION NEEDS: -No education needs identified at this time   Intake/Output Summary (Last 24 hours) at 07/31/13 1048 Last data filed at 07/31/13 1000  Gross per 24 hour  Intake 5459.01 ml  Output   4920 ml  Net 539.01 ml    Last BM: PTA  Labs:   Recent Labs Lab 07/30/13 1253 07/30/13 1740 07/31/13 0102 07/31/13 0500  NA 141 137 141 141  K 4.5 3.7 3.4* 4.3  CL 105 105  --  108  CO2 26 21  --  25  BUN 28* 25*  --  21  CREATININE 1.03 0.81  --  0.76  CALCIUM 9.7 8.8  --  7.8*  MG  --   --   --  3.1*  GLUCOSE 144* 111* 138* 134*    CBG (last 3)   Recent Labs  07/31/13 0237 07/31/13 0342 07/31/13 0500  GLUCAP 94 121* 128*    Scheduled Meds: . acetaminophen  1,000 mg Oral Q6H   Or  . acetaminophen (TYLENOL) oral liquid 160 mg/5 mL  1,000 mg Per Tube Q6H  . aspirin EC  325 mg Oral Daily   Or  . aspirin  324 mg Per Tube Daily  . atorvastatin  20 mg Oral q1800  . bisacodyl  10 mg Oral Daily   Or  . bisacodyl  10 mg Rectal Daily  . cefUROXime (ZINACEF)  IV  1.5 g Intravenous Q12H  . docusate sodium  200 mg  Oral Daily  . insulin regular  0-10 Units Intravenous TID WC  . levofloxacin (LEVAQUIN) IV  500 mg Intravenous Daily  . metoCLOPramide (REGLAN) injection  10 mg Intravenous Q6H  . [START ON 08/01/2013] pantoprazole  40 mg Oral Daily  . sodium chloride  3 mL Intravenous Q12H    Continuous Infusions: . sodium chloride 20 mL/hr at 07/31/13 0400  . sodium chloride 20 mL/hr at 07/31/13 0123  . sodium chloride    . dexmedetomidine Stopped (07/31/13 0800)  . DOPamine 5 mcg/kg/min (07/31/13 1000)  . insulin (NOVOLIN-R) infusion 2.5 mL/hr at 07/31/13 1000  . lactated ringers 20 mL/hr at 07/31/13 0045  . nitroGLYCERIN Stopped (07/31/13 0135)  . phenylephrine (NEO-SYNEPHRINE) Adult infusion 50 mcg/min (07/31/13 1000)    Past Medical History  Diagnosis Date  . Dyslipidemia   . History of esophageal cancer     s/p transhiatal esophagogastrectomy   . Hypothyroidism   . Cerebral aneurysm     [place]. repair  1994  . History of lung cancer   . Pneumonia   . Cancer     Esophageal, adrenal gland, skin; lung  . Stroke 1995    denies residual  . 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 SBO (small bowel obstruction) 08/15/2012    History of small bowel obstruction (status post small bowel       resection, lysis of adhesions, incidental appendectomy, and repair       of left diaphragmatic hernia  . AAA (abdominal aortic aneurysm) 08/22/2012    3.9 cm by CT  - June 2013, stable     Past Surgical History  Procedure Laterality Date  . Left upper lobectomy with node dissection  02/24/2010    [place]  . Exploratory laparotomy, lysis of adhesions, reduce of incarcerated small bowel and colon from the chest, limited small bowel resection, incidental appendectomy, and then repair of diaphragmatic hernia with alloderm mesh  10/27/2009    [place]  . Left subclavian port- a-cath insertion  03/09/2004    [place]  . Exploratory laparotomy,exploratory thoracotomy for hemorrhage  02/11/2003    [place]  . Transhiatal esophagectomy with cholecystectomy, jejunostomy,  pyloroplasty and removal of right subclavian port-a- cath      [facility]  . Brain surgery      brain aneursyn  . Tonsillectomy    . Cholecystectomy      Jarold Motto MS, RD, LDN Pager: [phone] After-hours pager: [phone]

## 2013-07-31 NOTE — Op Note (Signed)
CARDIOVASCULAR SURGERY OPERATIVE NOTE  07/31/2013  Surgeon:  Alleen Borne, MD  First Assistant: Lowella Dandy, [facility]   Preoperative Diagnosis:  Severe multi-vessel coronary artery disease with acute MI and cardiogenic shock   Postoperative Diagnosis:  Same   Procedure:  1. Emergency Median Sternotomy 2. Extracorporeal circulation 3.   Coronary artery bypass grafting x 2   Left internal mammary graft to the LAD  SVG to distal LCX  4.   Endoscopic vein harvest from the left leg   Anesthesia:  General Endotracheal   Clinical History/Surgical Indication:  72 year old gentleman who says he was doing well until yesterday while playing golf when he developed worsening dyspnea and generalized weakness. He quit after 14 holes and went home. He continued to feel poorly with development of nausea and vomiting and could not lie flat without coughing. He came to the ER this afternoon and troponin was elevated at 17. ECG showed incomplete LBBB, no other acute changes. Cath shows severe multivessel disease with occluded LAD after septal with faint L to L collat filling the distal vessel to the apex. The LCX is a dominant vessel and is occluded proximally with L to L collat. The Ramus is moderate in size and has no significant disease. The RCA is small and nondominant with 90% proximal disease. LVEF is 20% with moderate MR. There is pulmonary HTN with PAP 53/29. IABP was inserted.  He was recently seen by Dr. Tyrone Sage for workup of a right pleural effusion with some hypermetabolic uptake in the right pleura on PET scan. He had thoracentesis with fluid negative for malignancy. 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 Recent  PET showed no other areas of hypermetabolic activity except the right pleura which was minimal. He has severe multivessel coronary artery disease with occluded LAD and LCX with severe LV dysfunction and acute MI with cardiogenic shock and CHF. I think emergent CABG is the only option for trying to improve his situation. His operative risk is very high and I can't be sure that revascularization will improve his ventricular function and CHF. This MI probably started yesterday. I discussed the options of emergent CABG and palliative medical therapy with the patient and his family. He would like to proceed with surgery. I told him that his operative mortality is about 50%. I discussed the operative procedure with the patient and family including alternatives, benefits and risks; including but not limited to bleeding, blood transfusion, infection, stroke, myocardial infarction, graft failure, heart block requiring a permanent pacemaker, organ dysfunction, and death. [place] Ouch understands and agrees to proceed.     Preparation:  The patient was taken directly to the operating room and the correct patient, correct operation were confirmed with the patient after reviewing the medical record and catheterization. The  consent was signed by me. Preoperative antibiotics were given. A radial arterial line were placed by the anesthesia team. After being placed under general endotracheal anesthesia by the anesthesia team a foley catheter was placed. Dr. Michelle Piper attempted to insert a sleeve for the PA catheter via the right and left IJ and left subclavian veins and could access the veins but could not get the wire to pass. I attempted to place it in the right subclavian vein but could not get the wire to pass. Therefore the right femoral venous sheath that was placed in the cath lab was exchanged over a wire to a swan sleeve and the PA catheter advanced into the PA from the femoral vein. The patient was positioned supine on  the operating room table.  The neck, chest, abdomen, and both legs were prepped with betadine soap and solution and draped in the usual sterile manner. A surgical time-out was taken and the correct patient and operative procedure were confirmed with the nursing and anesthesia staff.   Cardiopulmonary Bypass:  A median sternotomy was performed. The pericardium was opened in the midline. Right ventricular function appeared normal. The ascending aorta was of normal size and had no palpable plaque. We could not use a TEE since the patient had a previous esophagectomy with gastric pull-up into the left neck.  There were no contraindications to aortic cannulation or cross-clamping. The patient was fully systemically heparinized and the ACT was maintained > 400 sec. The proximal aortic arch was cannulated with a 20 F aortic cannula for arterial inflow. Venous cannulation was performed via the right atrial appendage using a two-staged venous cannula. An antegrade cardioplegia/vent cannula was inserted into the mid-ascending aorta. A retrograde cardioplegia cannula was placed via the right atrium into the coronary sinus. Aortic occlusion was performed with a single cross-clamp. Systemic cooling to 32 degrees Centigrade and topical cooling of the heart with iced saline were used. Hyperkalemic antegrade cold blood cardioplegia was used to induce diastolic arrest and then cold blood retrograde cardioplegia was given to decrease myocardial temperature to 10 degrees C. Retrograde cardioplegia was then given at about 20 minute intervals throughout the period of arrest to maintain myocardial temperature at or below 10 degrees centigrade. A temperature probe was inserted into the interventricular septum and an insulating pad was placed in the pericardium.   Left internal mammary harvest:  The left side of the sternum was retracted using the Rultract retractor. The left internal mammary artery was harvested as a pedicle  graft. All side branches were clipped. It was a medium-sized vessel of good quality with good blood flow. It was ligated distally and divided. It was sprayed with topical papaverine solution to prevent vasospasm.   Endoscopic vein harvest:  The left greater saphenous vein was harvested endoscopically through a 2 cm incision medial to the left knee. It was harvested from the upper thigh to the knee. It was a medium-sized vein of good quality. The side branches were all ligated with 4-0 silk ties.    Coronary arteries:  The coronary arteries were examined.   LAD:  This was diffusely diseased   LCX:  Distal LCX branch was also diffusely diseased but graftable   RCA:  Small, nondominant and not visible.  The anterior wall was chronically infarcted with thinning and scar throughout.  Grafts:  1. LIMA to the LAD: 1.6 mm. It was sewn end to side using 8-0 prolene continuous suture. 2. SVG to distal LCX:  1.6 mm. It was  sewn end to side using 7-0 prolene continuous suture.  The proximal vein graft anastomosis was performed to the mid-ascending aorta using continuous 6-0 prolene suture. Graft marker was placed around the proximal anastomosis.   Completion:  The patient was rewarmed to 37 degrees Centigrade. The clamp was removed from the LIMA pedicle and there was rapid warming of the septum and return of ventricular fibrillation. The crossclamp was removed with a time of 58 minutes. There was spontaneous return of sinus rhythm. The distal and proximal anastomoses were checked for hemostasis. The position of the grafts was satisfactory. Two temporary epicardial pacing wires were placed on the right atrium and two on the right ventricle. The patient was weaned from CPB without difficulty on dopamine at 5 mcg/kg/min and the IABP at 1:1. CPB time was 84 minutes. Cardiac output was 4.6 LPM. Heparin was fully reversed with protamine and the aortic and venous cannulas removed. Hemostasis was  achieved. Mediastinal and left pleural drainage tubes were placed. The sternum was closed with double #6 stainless steel wires. The fascia was closed with continuous # 1 vicryl suture. The subcutaneous tissue was closed with 2-0 vicryl continuous suture. The skin was closed with 3-0 vicryl subcuticular suture. All sponge, needle, and instrument counts were reported correct at the end of the case. Dry sterile dressings were placed over the incisions and around the chest tubes which were connected to pleurevac suction. The patient was then transported to the surgical intensive care unit in critical but stable condition.

## 2013-07-31 NOTE — Progress Notes (Signed)
  Echocardiogram 2D Echocardiogram has been performed.  Glen Green FRANCES 07/31/2013, 12:20 PM

## 2013-07-31 NOTE — Progress Notes (Signed)
Nutrition Brief Note  RD contacted by RN regarding patient's oral nutrition supplement. Per RN, pt is on an insulin drip and she is concerned about the patient's blood sugar with Ensure Complete and is requesting Glucerna Shake instead. Per most recent notes, pt may have new dx of DM.  Noted blood sugars ranging from 94 - 134.  No nutrition interventions warranted at this time. RD to continue to follow nutrition care plan.  Jarold Motto MS, RD, LDN Pager: [phone] After-hours pager: [phone]

## 2013-07-31 NOTE — Procedures (Signed)
Extubation Procedure Note  Patient Details:   Name: Glen Green DOB: 1941-08-21 MRN: [id number]   Airway Documentation:     Evaluation  O2 sats: stable throughout and currently acceptable Complications: No apparent complications Patient did tolerate procedure well. Bilateral Breath Sounds: Rhonchi Suctioning: Airway Yes Pt awake and alert, extubated per protocol. Placed on 4L [place], sat 100%. Positive cuff leak, NIF -20, VC 600, IS 750. BBS RH. Pt ablel to vocalize and no complications noted. Arloa Koh 07/31/2013, 9:06 AM

## 2013-07-31 NOTE — Progress Notes (Signed)
Inpatient Diabetes Program Recommendations  AACE/ADA: New Consensus Statement on Inpatient Glycemic Control (2013)  Target Ranges:  Prepandial:   less than 140 mg/dL      Peak postprandial:   less than 180 mg/dL (1-2 hours)      Critically ill patients:  140 - 180 mg/dL   Reason for Visit:  Note patient continues on insulin drip.  No previous history of diabetes noted, however A1C=6.5% indicating possible new diagnosis??  Consider continuing insulin drip until the morning of 08/01/13 due to high insulin drip rates and variable CBG's.

## 2013-08-01 ENCOUNTER — Inpatient Hospital Stay (HOSPITAL_COMMUNITY): Payer: Medicare Other

## 2013-08-01 DIAGNOSIS — R570 Cardiogenic shock: Secondary | ICD-10-CM

## 2013-08-01 DIAGNOSIS — I2510 Atherosclerotic heart disease of native coronary artery without angina pectoris: Secondary | ICD-10-CM

## 2013-08-01 LAB — BASIC METABOLIC PANEL
CO2: 26 mEq/L (ref 19–32)
Calcium: 8.2 mg/dL — ABNORMAL LOW (ref 8.4–10.5)
Chloride: 105 mEq/L (ref 96–112)
Potassium: 4.3 mEq/L (ref 3.5–5.1)
Sodium: 136 mEq/L (ref 135–145)

## 2013-08-01 LAB — GLUCOSE, CAPILLARY
Glucose-Capillary: 92 mg/dL (ref 70–99)
Glucose-Capillary: 125 mg/dL — ABNORMAL HIGH (ref 70–99)
Glucose-Capillary: 149 mg/dL — ABNORMAL HIGH (ref 70–99)
Glucose-Capillary: 114 mg/dL — ABNORMAL HIGH (ref 70–99)
Glucose-Capillary: 128 mg/dL — ABNORMAL HIGH (ref 70–99)
Glucose-Capillary: 184 mg/dL — ABNORMAL HIGH (ref 70–99)
Glucose-Capillary: 160 mg/dL — ABNORMAL HIGH (ref 70–99)
Glucose-Capillary: 86 mg/dL (ref 70–99)
Glucose-Capillary: 69 mg/dL — ABNORMAL LOW (ref 70–99)
Glucose-Capillary: 112 mg/dL — ABNORMAL HIGH (ref 70–99)

## 2013-08-01 LAB — CBC
Platelets: 122 10*3/uL — ABNORMAL LOW (ref 150–400)
RBC: 2.85 MIL/uL — ABNORMAL LOW (ref 4.22–5.81)
WBC: 9.5 10*3/uL (ref 4.0–10.5)

## 2013-08-01 LAB — POCT ACTIVATED CLOTTING TIME: Activated Clotting Time: 135 seconds

## 2013-08-01 MED ORDER — DM-GUAIFENESIN ER 30-600 MG PO TB12
1.0000 | ORAL_TABLET | Freq: Two times a day (BID) | ORAL | Status: DC
  Administered 2013-08-01 – 2013-08-11 (×21): 1 via ORAL
  Filled 2013-08-01 (×23): qty 1

## 2013-08-01 MED ORDER — INSULIN ASPART 100 UNIT/ML ~~LOC~~ SOLN: 0.0000 [IU] | SUBCUTANEOUS | Status: DC

## 2013-08-01 MED ORDER — TRAMADOL HCL 50 MG PO TABS
50.0000 mg | ORAL_TABLET | Freq: Four times a day (QID) | ORAL | Status: DC | PRN
  Administered 2013-08-03 – 2013-08-10 (×6): 50 mg via ORAL
  Filled 2013-08-01 (×7): qty 1

## 2013-08-01 MED ORDER — ATORVASTATIN CALCIUM 80 MG PO TABS
80.0000 mg | ORAL_TABLET | Freq: Every day | ORAL | Status: DC
  Administered 2013-08-01 – 2013-08-10 (×10): 80 mg via ORAL
  Filled 2013-08-01 (×11): qty 1

## 2013-08-01 MED ORDER — INSULIN ASPART 100 UNIT/ML ~~LOC~~ SOLN
0.0000 [IU] | SUBCUTANEOUS | Status: DC
  Administered 2013-08-01 – 2013-08-02 (×2): 2 [IU] via SUBCUTANEOUS

## 2013-08-01 MED ORDER — BIOTENE DRY MOUTH MT LIQD
15.0000 mL | Freq: Two times a day (BID) | OROMUCOSAL | Status: DC
  Administered 2013-08-01 – 2013-08-04 (×7): 15 mL via OROMUCOSAL

## 2013-08-01 MED ORDER — ENOXAPARIN SODIUM 40 MG/0.4ML ~~LOC~~ SOLN
40.0000 mg | Freq: Every day | SUBCUTANEOUS | Status: DC
  Administered 2013-08-01 – 2013-08-10 (×10): 40 mg via SUBCUTANEOUS
  Filled 2013-08-01 (×11): qty 0.4

## 2013-08-01 NOTE — Progress Notes (Signed)
   Subjective:  Denies CP or dyspnea; complains of cough   Objective:  Filed Vitals:   08/01/13 1015 08/01/13 1030 08/01/13 1045 08/01/13 1100  BP:      Pulse: 108 118 114 110  Temp:      TempSrc:      Resp: 26 22 25 25   Height:      Weight:      SpO2: 98% 100%  100%    Intake/Output from previous day:  Intake/Output Summary (Last 24 hours) at 08/01/13 1122 Last data filed at 08/01/13 1100  Gross per 24 hour  Intake 2890.05 ml  Output   2540 ml  Net 350.05 ml    Physical Exam: Physical exam: Well-developed well-nourished in no acute distress.  Skin is warm and dry.  HEENT is normal.  Neck is supple.  Chest with diminished BS  Cardiovascular exam is regular rate and rhythm. + gallop Abdominal exam nontender or distended. No masses palpated. Extremities show trace edema. neuro grossly intact    Lab Results: Basic Metabolic Panel:  Recent Labs  27/25/36 0500 07/31/13 1700 07/31/13 1709 08/01/13 0354  NA 141  --  140 136  K 4.3  --  4.2 4.3  CL 108  --  107 105  CO2 25  --   --  26  GLUCOSE 134*  --  160* 120*  BUN 21  --  22 24*  CREATININE 0.76 0.79 0.90 0.69  CALCIUM 7.8*  --   --  8.2*  MG 3.1* 2.6*  --   --    CBC:  Recent Labs  07/30/13 1253  07/31/13 1700 07/31/13 1709 08/01/13 0354  WBC 6.9  < > 9.6  --  9.5  NEUTROABS 4.5  --   --   --   --   HGB 13.9  < > 9.1* 9.5* 8.9*  HCT 39.7  < > 26.9* 28.0* 26.1*  MCV 89.4  < > 90.0  --  91.6  PLT 204  < > 123*  --  122*  < > = values in this interval not displayed. Cardiac Enzymes:  Recent Labs  07/30/13 1253 07/30/13 1521 07/30/13 1740  CKTOTAL  --  965* 796*  CKMB  --  91.2* 70.0*  TROPONINI 17.75* 18.73* 18.19*     Assessment/Plan:  1 Status post myocardial infarction-continue aspirin and statin. Add beta blockade later as his blood pressure improves. 2 ischemic cardiomyopathy-the patient's ejection fraction is 25% with moderate mitral regurgitation. IABP removed today. Add  beta-blockade and ACE inhibitor later as blood pressure improves and dopamine has been weaned. He will need for titration of medications and then repeat echocardiogram in 3 months. Ejection fraction is less than 35% he will need an ICD. 3 coronary artery disease-continue aspirin and statin. 4 postoperative volume excess-patient complains of cough most likely from mild volume excess. Blood pressure is tenuous. Diurese later as tolerated. 5 hyperlipidemia-increase Lipitor to 80 mg daily given recent infarct. 6 history of abdominal aortic aneurysm 7 history of lung cancer  Olga Millers 08/01/2013, 11:22 AM

## 2013-08-01 NOTE — Op Note (Signed)
NAME:  Glen Green, Glen Green NO.:  [id number]  MEDICAL RECORD NO.:  [id number]  LOCATION:  2S13C                        FACILITY:  [facility]  PHYSICIAN:  Kerin Perna, M.D.  DATE OF BIRTH:  06-04-1941  DATE OF PROCEDURE:  08/01/2013 DATE OF DISCHARGE:                              OPERATIVE REPORT   OPERATION:  Removal of intra-aortic balloon pump.  PREOPERATIVE DIAGNOSIS:  Acute cardiogenic shock, myocardial infarction, intra-aortic balloon pump placed in cath lab.  POSTOPERATIVE DIAGNOSIS:  Acute cardiogenic shock, myocardial infarction, intra-aortic balloon pump placed in cath lab.  SURGEON:  Kerin Perna, MD.  ANESTHESIA:  None.  PROCEDURE:  The patient is recovering from emergency CABG following presentation and acute cardiogenic shock due to an acute MI.  He has been extubated with stable hemodynamics for almost 48 hours, and a balloon pump has been weaned successfully.  Coagulation parameters are acceptable and the balloon pump is to be removed.  The procedure was explained to the patient, and a proper time-out was performed.  DESCRIPTION OF PROCEDURE:  The balloon pump was aspirated with a 50 mL syringe and the sutures were cut after the area was prepped in a sterile field.  The balloon pump catheter was pulled to the sheath and then the balloon pump with the sheath were removed in 1 minute.  There was initial spurt of blood and then pressure was applied to the femoral artery and held by hand for 30 minutes by the clock.  A Doppler pulses palpable and the right foot after removal of the balloon pump from the right femoral artery.  There was a palpable right femoral pulse following hand compression for 30 minutes.  The patient tolerated the procedure with stable hemodynamics.     Kerin Perna, M.D.     PV/MEDQ  D:  08/01/2013  T:  08/01/2013  Job:  [id number]

## 2013-08-01 NOTE — Evaluation (Signed)
Clinical/Bedside Swallow Evaluation Patient Details  Name: Glen Green MRN: [id number] Date of Birth: 05/18/1941  Today's Date: 08/01/2013 Time: 1616-     Past Medical History:  Past Medical History  Diagnosis Date   Dyslipidemia    History of esophageal cancer     s/p transhiatal esophagogastrectomy   Hypothyroidism    Cerebral aneurysm     [place]. 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 2005   SBO (small bowel obstruction) 08/15/2012    History of small bowel obstruction (status post small bowel       resection, lysis of adhesions, incidental appendectomy, and repair       of left diaphragmatic hernia   AAA (abdominal aortic aneurysm) 08/22/2012    3.9 cm by CT  - June 2013, stable    Past Surgical History:  Past Surgical History  Procedure Laterality Date   Left upper lobectomy with node dissection  02/24/2010    [place]   Exploratory laparotomy, lysis of adhesions, reduce of incarcerated small bowel and colon from the chest, limited small bowel resection, incidental appendectomy, and then repair of diaphragmatic hernia with alloderm mesh  10/27/2009    [place]   Left subclavian port- a-cath insertion  03/09/2004    [place]   Exploratory laparotomy,exploratory thoracotomy for hemorrhage  02/11/2003    [place]   Transhiatal esophagectomy with cholecystectomy, jejunostomy,  pyloroplasty and removal of right subclavian port-a- cath      [place]   Brain surgery      brain aneursyn   Tonsillectomy     Cholecystectomy     Coronary artery bypass graft N/A 07/30/2013    Procedure: CORONARY ARTERY BYPASS GRAFTING (CABG) times two on pump using left internal mammary artery and left greater saphenous vein via endovein harvest.;  Surgeon: Alleen Borne, MD;  Location: [facility] OR;  Service: Open Heart Surgery;  Laterality: N/A;    HPI:  Pt is a 72 y.o. male with no history of CAD. His baseline activity level is poor, with chronic DOE. He also gets bilateral leg weakness with exertion. Pt admitted to ER following nonproductive cough, diarrhea, increased DOE and LE weakness.  He was evaluated for possible infection but a troponin was checked and was elevated, so cardiology was asked to evaluate him. Currently, he is sitting up and on O2, so is breathing better, but cannot do anything without coughing or feeling SOB. Pt with PMH of esophageal cancer, hypothryoidism, hx of lung cancer, PNA, CVA however denies residual deficits (1995), adrenal tumor, SBO, and AAA. Pt presents with NSTEMI and received emergent CABG which required intubation from 8/21-8/22. Current diet of Dys 3 (mech soft) with nectar thick liquids. CXR IMPRESSION (8/21): Bilateral lower lobe airspace opacities with effusions. FINDINGS could reflect edema/ CHF or pneumonia. Recommend clinical correlation. COPD.    Assessment / Plan / Recommendation Clinical Impression  Pt with complex PMH of esophageal cancer, PNA,  and CVA. At bedside, pt presents with functional oropharyngeal swallow with exception of delayed throat clear following straw sip of thin liquid and belching indicitive of ?esophageal deficit. Unable to objectively identify cause of throat clear, however MD reports pt coughing and regurgitating secretions at bedside this AM. MD recommends conservative diet of Dys 3/ Nectar thick liquids which this clinician  agrees with at this time. Recommend close f/u by SLP acutely to assess appropriateness for upgrade or MBS if pt continues to demonstrate s/s of aspiration. Educated pt and family on aspiration precautions and safe swallowing strategies of small sips and slow rates with po intake to reduce risk of aspiration.     Aspiration Risk  Mild    Diet Recommendation Dysphagia 3 (Mechanical Soft);Nectar-thick liquid   Liquid Administration via: Cup;No  straw Medication Administration: Whole meds with liquid Supervision: Patient able to self feed Compensations: Slow rate;Small sips/bites Postural Changes and/or Swallow Maneuvers: Seated upright 90 degrees;Upright 30-60 min after meal    Other  Recommendations Oral Care Recommendations: Oral care BID Other Recommendations: Order thickener from pharmacy;Prohibited food (jello, ice cream, thin soups);Remove water pitcher   Follow Up Recommendations       Frequency and Duration min 2x/week  2 weeks   Pertinent Vitals/Pain n/a    SLP Swallow Goals Patient will consume recommended diet without observed clinical signs of aspiration with: Independent assistance Swallow Study Goal #1 - Progress: Progressing toward goal Patient will utilize recommended strategies during swallow to increase swallowing safety with: Modified independent assistance Swallow Study Goal #2 - Progress: Progressing toward goal   Swallow Study Prior Functional Status   WFL    General HPI: Pt is a 72 y.o. male with no history of CAD. His baseline activity level is poor, with chronic DOE. He also gets bilateral leg weakness with exertion. Pt admitted to ER following nonproductive cough, diarrhea, increased DOE and LE weakness.  He was evaluated for possible infection but a troponin was checked and was elevated, so cardiology was asked to evaluate him. Currently, he is sitting up and on O2, so is breathing better, but cannot do anything without coughing or feeling SOB. Pt with PMH of esophageal cancer, hypothryoidism, hx of lung cancer, PNA, CVA however denies residual deficits (1995), adrenal tumor, SBO, and AAA. Pt presents with NSTEMI and received emergent CABG which required intubation from 8/21-8/22. Current diet of Dys 3 (mech soft) with nectar thick liquids. CXR IMPRESSION (8/21): Bilateral lower lobe airspace opacities with effusions. FINDINGS could reflect edema/ CHF or pneumonia. Recommend clinical correlation. COPD.   Type of Study: Bedside swallow evaluation Diet Prior to this Study: Dysphagia 3 (soft);Nectar-thick liquids Respiratory Status: Supplemental O2 delivered via (comment) (cannula) History of Recent Intubation: Yes Length of Intubations (days): 1 days Date extubated: 07/31/13 Behavior/Cognition: Alert;Cooperative;Pleasant mood Oral Cavity - Dentition: Dentures, top;Dentures, bottom Self-Feeding Abilities: Able to feed self Patient Positioning: Upright in bed Baseline Vocal Quality: Hoarse Volitional Cough: Strong Volitional Swallow: Able to elicit    Oral/Motor/Sensory Function Overall Oral Motor/Sensory Function: Appears within functional limits for tasks assessed   Ice Chips     Thin Liquid Thin Liquid: Impaired Presentation: Cup;Straw Pharyngeal  Phase Impairments: Throat Clearing - Delayed    Nectar Thick Nectar Thick Liquid: Not tested   Honey Thick     Puree Puree: Within functional limits   Solid   GO    Solid: Within functional limits Presentation: Self Fed       Lyanne Co MA CCC-SLP 08/01/2013,4:49 PM

## 2013-08-01 NOTE — Progress Notes (Signed)
2 Days Post-Op Procedure(s) (LRB): CORONARY ARTERY BYPASS GRAFTING (CABG) times two on pump using left internal mammary artery and left greater saphenous vein via endovein harvest. (N/A) Subjective: Postop day 2 emergency CABG following acute MI with shock Sinus tachycardia, cardiac index 2.0 on low-dose dopamine Intra-aortic balloon pump removed from right femoral artery Chest x-ray with left lower atelectasis Positive airleak from Chest tubes Neuro intact Good pedal pulse on right following removal of balloon pump Wet cough fairly persistent  Objective: Vital signs in last 24 hours: Temp:  [98.4 F (36.9 C)-101.7 F (38.7 C)] 99.1 F (37.3 C) (08/23 0845) Pulse Rate:  [30-119] 94 (08/23 0815) Cardiac Rhythm:  [-] Normal sinus rhythm (08/23 0800) Resp:  [0-29] 22 (08/23 0845) BP: (103)/(58) 103/58 mmHg (08/23 0800) SpO2:  [92 %-100 %] 96 % (08/23 0800) Arterial Line BP: (80-121)/(46-63) 100/57 mmHg (08/23 0845) Weight:  [135 lb 12.9 oz (61.6 kg)] 135 lb 12.9 oz (61.6 kg) (08/23 0700)  Hemodynamic parameters for last 24 hours: PAP: (34-47)/(16-28) 40/21 mmHg CO:  [3.2 L/min-4.1 L/min] 3.5 L/min CI:  [1.8 L/min/m2-2.3 L/min/m2] 2 L/min/m2  Intake/Output from previous day: 08/22 0701 - 08/23 0700 In: 2751.8 [I.V.:2501.8; IV Piggyback:250] Out: 2610 [Urine:1400; Chest Tube:1210] Intake/Output this shift: Total I/O In: 426.4 [P.O.:240; I.V.:186.4] Out: 160 [Urine:110; Chest Tube:50]  Exam Coarse breath sounds Neuro intact Extremities warm  Lab Results:  Recent Labs  07/31/13 1700 07/31/13 1709 08/01/13 0354  WBC 9.6  --  9.5  HGB 9.1* 9.5* 8.9*  HCT 26.9* 28.0* 26.1*  PLT 123*  --  122*   BMET:  Recent Labs  07/31/13 0500  07/31/13 1709 08/01/13 0354  NA 141  --  140 136  K 4.3  --  4.2 4.3  CL 108  --  107 105  CO2 25  --   --  26  GLUCOSE 134*  --  160* 120*  BUN 21  --  22 24*  CREATININE 0.76  < > 0.90 0.69  CALCIUM 7.8*  --   --  8.2*  < > =  values in this interval not displayed.  PT/INR:  Recent Labs  07/31/13 0100  LABPROT 17.9*  INR 1.52*   ABG    Component Value Date/Time   PHART 7.336* 07/31/2013 1003   HCO3 22.6 07/31/2013 1003   TCO2 24 07/31/2013 1709   ACIDBASEDEF 3.0* 07/31/2013 1003   O2SAT 88.0 07/31/2013 1003   CBG (last 3)   Recent Labs  08/01/13 0008 08/01/13 0203 08/01/13 0756  GLUCAP 85 103* 65*    Assessment/Plan: S/P Procedure(s) (LRB): CORONARY ARTERY BYPASS GRAFTING (CABG) times two on pump using left internal mammary artery and left greater saphenous vein via endovein harvest. (N/A) plan Balloon pump outf dangle 6 hours than out of bed to chair Wean neo-and dopamine, whole diuresis for now Speech swallow study for persistent wet cough, follow pulmonary status   LOS: 2 days    VAN TRIGT III,Deniro Laymon 08/01/2013

## 2013-08-01 NOTE — Progress Notes (Signed)
Hypoglycemic Event  CBG: 65  Treatment: 15 GM carbohydrate snack  Symptoms: None  Follow-up CBG: Time:0843 CBG Result:69  Treatment: 15 GM carbohydrate snack  Symptoms: None  Follow-up CBG: Time:0907 CBG Result:59  Possible Reasons for Event: Other: Rechecked blood glucose by drawing from arterial line (instead of finger stick) and result = 162.  Comments/MD notified: No further action taken.    Bowen, Anaija Wissink Chantelle  Remember to initiate Hypoglycemia Order Set & complete    Bowen, Alvester Chou  Remember to initiate Hypoglycemia Order Set & complete

## 2013-08-01 NOTE — Plan of Care (Signed)
Problem: Phase II Progression Outcomes Goal: Tolerates D/C of vasopressors Outcome: Not Progressing Remains on phenylephrine and dopamine gtts. Goal: Dangles within 2 hrs after extubation Outcome: Not Met (add Reason) Unable to dangle due to IABP remaining in place.

## 2013-08-02 ENCOUNTER — Inpatient Hospital Stay (HOSPITAL_COMMUNITY): Payer: Medicare Other

## 2013-08-02 LAB — HEPATIC FUNCTION PANEL
ALT: 46 U/L (ref 0–53)
AST: 55 U/L — ABNORMAL HIGH (ref 0–37)
Albumin: 2.7 g/dL — ABNORMAL LOW (ref 3.5–5.2)
Alkaline Phosphatase: 75 U/L (ref 39–117)
Bilirubin, Direct: 0.2 mg/dL (ref 0.0–0.3)
Indirect Bilirubin: 0.4 mg/dL (ref 0.3–0.9)
Total Bilirubin: 0.6 mg/dL (ref 0.3–1.2)
Total Protein: 5.5 g/dL — ABNORMAL LOW (ref 6.0–8.3)

## 2013-08-02 LAB — CBC
HCT: 25.5 % — ABNORMAL LOW (ref 39.0–52.0)
Hemoglobin: 8.6 g/dL — ABNORMAL LOW (ref 13.0–17.0)
MCH: 30.9 pg (ref 26.0–34.0)
MCHC: 33.7 g/dL (ref 30.0–36.0)
MCV: 91.7 fL (ref 78.0–100.0)
Platelets: 146 10*3/uL — ABNORMAL LOW (ref 150–400)
RBC: 2.78 MIL/uL — ABNORMAL LOW (ref 4.22–5.81)
RDW: 15.9 % — ABNORMAL HIGH (ref 11.5–15.5)
WBC: 10.7 10*3/uL — ABNORMAL HIGH (ref 4.0–10.5)

## 2013-08-02 LAB — BASIC METABOLIC PANEL
BUN: 30 mg/dL — ABNORMAL HIGH (ref 6–23)
CO2: 28 mEq/L (ref 19–32)
Calcium: 8.6 mg/dL (ref 8.4–10.5)
Chloride: 101 mEq/L (ref 96–112)
Creatinine, Ser: 0.63 mg/dL (ref 0.50–1.35)
GFR calc Af Amer: >90 mL/min (ref >90)
GFR calc non Af Amer: >90 mL/min (ref >90)
Glucose, Bld: 123 mg/dL — ABNORMAL HIGH (ref 70–99)
Potassium: 4.5 mEq/L (ref 3.5–5.1)
Sodium: 133 mEq/L — ABNORMAL LOW (ref 135–145)

## 2013-08-02 LAB — GLUCOSE, CAPILLARY
Glucose-Capillary: 106 mg/dL — ABNORMAL HIGH (ref 70–99)
Glucose-Capillary: 102 mg/dL — ABNORMAL HIGH (ref 70–99)
Glucose-Capillary: 100 mg/dL — ABNORMAL HIGH (ref 70–99)

## 2013-08-02 LAB — AMYLASE: Amylase: 30 U/L (ref 0–105)

## 2013-08-02 MED ORDER — METOCLOPRAMIDE HCL 5 MG/ML IJ SOLN
10.0000 mg | Freq: Four times a day (QID) | INTRAMUSCULAR | Status: AC
  Administered 2013-08-02 – 2013-08-03 (×8): 10 mg via INTRAVENOUS
  Filled 2013-08-02 (×8): qty 2

## 2013-08-02 MED ORDER — PROMETHAZINE HCL 25 MG/ML IJ SOLN
12.5000 mg | Freq: Four times a day (QID) | INTRAMUSCULAR | Status: DC | PRN
  Filled 2013-08-02: qty 1

## 2013-08-02 MED ORDER — RESOURCE THICKENUP CLEAR PO POWD
ORAL | Status: DC | PRN
  Filled 2013-08-02: qty 125

## 2013-08-02 MED ORDER — ALBUMIN HUMAN 5 % IV SOLN
12.5000 g | Freq: Once | INTRAVENOUS | Status: AC
  Administered 2013-08-02: 12.5 g via INTRAVENOUS

## 2013-08-02 NOTE — Progress Notes (Signed)
Patient examined and record reviewed.Hemodynamics stable,labs satisfactory.Patient had stable day.Continue current care.  Cont renal dopamine due to soft BP, low U/O VAN TRIGT III,Glen Green 08/02/2013

## 2013-08-02 NOTE — Progress Notes (Signed)
Per Dr Donata Clay no xray is needed prior to using left femoral central line.

## 2013-08-02 NOTE — Plan of Care (Signed)
Problem: Phase II Progression Outcomes Goal: Tolerates D/C of vasopressors Outcome: Progressing Phenylephrine weaned completely off.  Gently weaning dopamine.

## 2013-08-02 NOTE — Progress Notes (Signed)
Speech Language Pathology Dysphagia Treatment Patient Details Name: Glen Green MRN: [id number] DOB: 12/15/1940 Today's Date: 08/02/2013 Time: 1300-1330 SLP Time Calculation (min): 30 min  Assessment / Plan / Recommendation Clinical Impression  F/u from initial BSE for diet tolerance of dysphagia 3 and nectar thick liquids. Review of CXR 08/02/13: Worsening left pleural effusion and left lung atelectasis/consolidation.   Patient seen during noon meal with baseline wet, cough prior to PO's.  Observed directly with thin water (room temperature) by spoon and cup with no observed coughing or change in vital signs.  Wet cough noted during oral phase with soft solids but did not appear to be related to aspiration but question reflux from previous thin liquid trials.  No cues necessary for patient to utilize basic aspiration precautions during meal.  Decreased PO intake secondary to patient stating he was not hungry.  Observed with NTL x1 as patient declined further trials.    Recommend to continue current diet consistency.  ST to continue in acute care for diet tolerance and possible advancement. Completion of objective evaluation to be determined.     Diet Recommendation  Continue with Current Diet: Dysphagia 3 (mechanical soft)/Nectar      SLP Plan Continue with current plan of care      Swallowing Goals  SLP Swallowing Goals Swallow Study Goal #1 - Progress: Progressing toward goal Swallow Study Goal #2 - Progress: Progressing toward goal  General Temperature Spikes Noted: No Respiratory Status: Supplemental O2 delivered via (comment) Behavior/Cognition: Alert;Cooperative;Pleasant mood Oral Cavity - Dentition: Dentures, top;Dentures, bottom Patient Positioning: Upright in bed  Oral Cavity - Oral Hygiene Does patient have any of the following "at risk" factors?: Oxygen therapy - cannula, mask, simple oxygen devices;Diet - patient on thickened liquids Patient is HIGH RISK - Oral Care  Protocol followed (see row info): Yes Patient is AT RISK - Oral Care Protocol followed (see row info): Yes   Dysphagia Treatment Treatment focused on: Skilled observation of diet tolerance;Upgraded PO texture trials;Patient/family/caregiver education;Facilitation of pharyngeal phase Family/Caregiver Educated: Spouse Treatment Methods/Modalities: Skilled observation;Differential diagnosis Patient observed directly with PO's: Yes Type of PO's observed: Dysphagia 3 (soft);Thin liquids;Nectar-thick liquids Feeding: Able to feed self Liquids provided via: Cup;Teaspoon Type of cueing: Verbal Amount of cueing: Minimal   GO    [name] MS, CC-SLP [phone] [facility] [place] 08/02/2013, 2:21 PM

## 2013-08-02 NOTE — Plan of Care (Signed)
Problem: Phase II Progression Outcomes Goal: Tolerates D/C of vasopressors Outcome: Progressing Able to start weaning neo slowly.

## 2013-08-02 NOTE — Progress Notes (Signed)
3 Days Post-Op Procedure(s) (LRB): CORONARY ARTERY BYPASS GRAFTING (CABG) times two on pump using left internal mammary artery and left greater saphenous vein via endovein harvest. (N/A) Subjective: Postop C. CABG x2, preop balloon pump and cardiogenic shock Weaning dopamine and neo-today with adequate blood pressure and hemodynamics Right pleural tube remains for persistent serous drainage, airleak has resolved Patient's nausea improved today on Reglan--SLT evaluation recommends nectar thick liquids, dysphasia 3.  Patient's groin sheath from the OR was removed and triple-lumen catheter placed on left side to allow ambulation and mobility. No central venous access or PICC access is available Neuro intact Persistent wet cough on IV Levaquin  Objective: Vital signs in last 24 hours: Temp:  [97.5 F (36.4 C)-98.2 F (36.8 C)] 98.2 F (36.8 C) (08/24 1233) Pulse Rate:  [55-112] 97 (08/24 1500) Cardiac Rhythm:  [-] Sinus tachycardia;Bundle branch block (08/24 1200) Resp:  [9-31] 15 (08/24 1500) BP: (83-112)/(49-72) 93/61 mmHg (08/24 1500) SpO2:  [94 %-100 %] 100 % (08/24 1500) Arterial Line BP: (86-143)/(40-87) 116/69 mmHg (08/24 0900) Weight:  [140 lb 14 oz (63.9 kg)] 140 lb 14 oz (63.9 kg) (08/24 0500)  Hemodynamic parameters for last 24 hours:   stable afebrile  Intake/Output from previous day: 08/23 0701 - 08/24 0700 In: 1875 [P.O.:420; I.V.:1305; IV Piggyback:150] Out: 1995 [Urine:975; Chest Tube:1020] Intake/Output this shift: Total I/O In: 332.7 [I.V.:232.7; IV Piggyback:100] Out: 520 [Urine:300; Chest Tube:220]  Exam Lungs slightly diminished on the left Neuro intact Extremities warm  Lab Results:  Recent Labs  08/01/13 0354 08/02/13 0400  WBC 9.5 10.7*  HGB 8.9* 8.6*  HCT 26.1* 25.5*  PLT 122* 146*   BMET:  Recent Labs  08/01/13 0354 08/02/13 0400  NA 136 133*  K 4.3 4.5  CL 105 101  CO2 26 28  GLUCOSE 120* 123*  BUN 24* 30*  CREATININE 0.69 0.63   CALCIUM 8.2* 8.6    PT/INR:  Recent Labs  07/31/13 0100  LABPROT 17.9*  INR 1.52*   ABG    Component Value Date/Time   PHART 7.336* 07/31/2013 1003   HCO3 22.6 07/31/2013 1003   TCO2 24 07/31/2013 1709   ACIDBASEDEF 3.0* 07/31/2013 1003   O2SAT 88.0 07/31/2013 1003   CBG (last 3)   Recent Labs  08/01/13 1958 08/02/13 0720 08/02/13 1224  GLUCAP 134* 106* 102*    Assessment/Plan: S/P Procedure(s) (LRB): CORONARY ARTERY BYPASS GRAFTING (CABG) times two on pump using left internal mammary artery and left greater saphenous vein via endovein harvest. (N/A) Plan to remove sheath so patient can be mobilized and start ambulation Will placed PT consult Wean inotropes   LOS: 3 days    VAN TRIGT III,Kelsie Zaborowski 08/02/2013

## 2013-08-02 NOTE — Progress Notes (Signed)
PT Cancellation Note  Patient Details Name: Glen Green MRN: [id number] DOB: 10-29-1941   Cancelled Treatment:    Reason Eval/Treat Not Completed: Fatigue/lethargy limiting ability to participate;Patient at procedure or test/unavailable.  Attempted to see patient x2 today - too fatigued in am and tubes/lines being pulled in pm.  RN requested PT hold today.  Will return tomorrow for PT evaluation.   Vena Austria 08/02/2013, 4:00 PM Durenda Hurt. Renaldo Fiddler, [facility] Pager [phone]

## 2013-08-03 ENCOUNTER — Inpatient Hospital Stay (HOSPITAL_COMMUNITY): Payer: Medicare Other

## 2013-08-03 LAB — POCT I-STAT 4, (NA,K, GLUC, HGB,HCT)
Glucose, Bld: 141 mg/dL — ABNORMAL HIGH (ref 70–99)
Glucose, Bld: 96 mg/dL (ref 70–99)
Glucose, Bld: 121 mg/dL — ABNORMAL HIGH (ref 70–99)
HCT: 25 % — ABNORMAL LOW (ref 39.0–52.0)
HCT: 31 % — ABNORMAL LOW (ref 39.0–52.0)
Hemoglobin: 10.5 g/dL — ABNORMAL LOW (ref 13.0–17.0)
Hemoglobin: 12.6 g/dL — ABNORMAL LOW (ref 13.0–17.0)
Hemoglobin: 8.5 g/dL — ABNORMAL LOW (ref 13.0–17.0)
Potassium: 3.6 mEq/L (ref 3.5–5.1)
Potassium: 3.8 mEq/L (ref 3.5–5.1)
Potassium: 5.2 mEq/L — ABNORMAL HIGH (ref 3.5–5.1)
Sodium: 140 mEq/L (ref 135–145)
Sodium: 140 mEq/L (ref 135–145)
Sodium: 141 mEq/L (ref 135–145)
Sodium: 141 mEq/L (ref 135–145)
Sodium: 142 mEq/L (ref 135–145)

## 2013-08-03 LAB — TYPE AND SCREEN
ABO/RH(D): A POS
Antibody Screen: NEGATIVE
Unit division: 0

## 2013-08-03 LAB — BASIC METABOLIC PANEL
BUN: 27 mg/dL — ABNORMAL HIGH (ref 6–23)
CO2: 31 mEq/L (ref 19–32)
Calcium: 8.5 mg/dL (ref 8.4–10.5)
Chloride: 100 mEq/L (ref 96–112)
Creatinine, Ser: 0.69 mg/dL (ref 0.50–1.35)
GFR calc Af Amer: >90 mL/min (ref >90)
GFR calc non Af Amer: >90 mL/min (ref >90)
Glucose, Bld: 102 mg/dL — ABNORMAL HIGH (ref 70–99)
Potassium: 3.8 mEq/L (ref 3.5–5.1)
Sodium: 135 mEq/L (ref 135–145)

## 2013-08-03 LAB — CBC
HCT: 23 % — ABNORMAL LOW (ref 39.0–52.0)
Hemoglobin: 7.8 g/dL — ABNORMAL LOW (ref 13.0–17.0)
MCH: 31.2 pg (ref 26.0–34.0)
MCHC: 33.9 g/dL (ref 30.0–36.0)
MCV: 92 fL (ref 78.0–100.0)
Platelets: 152 10*3/uL (ref 150–400)
RBC: 2.5 MIL/uL — ABNORMAL LOW (ref 4.22–5.81)
RDW: 16.1 % — ABNORMAL HIGH (ref 11.5–15.5)
WBC: 8 10*3/uL (ref 4.0–10.5)

## 2013-08-03 LAB — POCT I-STAT 3, ART BLOOD GAS (G3+)
Acid-base deficit: 3 mmol/L — ABNORMAL HIGH (ref 0.0–2.0)
Bicarbonate: 24.3 mEq/L — ABNORMAL HIGH (ref 20.0–24.0)
Bicarbonate: 24.5 mEq/L — ABNORMAL HIGH (ref 20.0–24.0)
O2 Saturation: 100 %
O2 Saturation: 100 %
TCO2: 25 mmol/L (ref 0–100)
pCO2 arterial: 39.4 mmHg (ref 35.0–45.0)
pCO2 arterial: 40.5 mmHg (ref 35.0–45.0)
pO2, Arterial: 210 mmHg — ABNORMAL HIGH (ref 80.0–100.0)
pO2, Arterial: 378 mmHg — ABNORMAL HIGH (ref 80.0–100.0)

## 2013-08-03 LAB — GLUCOSE, CAPILLARY

## 2013-08-03 NOTE — Progress Notes (Addendum)
Speech Language Pathology Dysphagia Treatment Patient Details Name: Glen Green MRN: [id number] DOB: 1941-05-18 Today's Date: 08/03/2013 Time: 4098-1191 SLP Time Calculation (min): 28 min  Assessment / Plan / Recommendation Clinical Impression  Pt seen for skilled SLP treatment to determine tolerance of po diet and possible readiness for dietary advancement.  Pt with clear voice today - observed him with 3 ounces Sprite nectar thick and 2 ounces thin Sprite without increased work of breathing or change in vitals.  Subtle cough x1 noted after intake of all thin consumption- pt states his chronic mild cough is not due to dysphagia.    Frequent belching heard during intake, which spouse states occurs at home- although pt denies.  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  Pt reports consuming Gatorade at home for his reflux - does not use a PPI per his statement.     Pt reports current swallow function to be at baseline and he states he consumes 3 meals a day at home.  He admits to occasional difficulties with sensing food lodged in esophagus, but denies occuring since in hospital.   Pt also denies nausea except one occasion this weekend when taking his medications.  Recommend MD consider advancing diet to allow thin liquids with strict precautions.  Note results of CXR today with minimally worsening airspace disease, ? Small amount of fluid.   Note pt on restriction for straw usage, ? due to aspiration concerns.  SLP to follow - educated pt, spouse and son to precautions, recommendations.     Diet Recommendation  Initiate / Change Diet: Dysphagia 3 (mechanical soft);Thin liquid    SLP Plan Continue with current plan of care   Pertinent Vitals/Pain Afebrile,decreased, intake poor   Swallowing Goals  SLP Swallowing Goals Swallow Study Goal #1 - Progress: Progressing toward goal Swallow Study Goal #2 - Progress: Progressing toward  goal  General Temperature Spikes Noted: No Respiratory Status: Supplemental O2 delivered via (comment) Behavior/Cognition: Alert;Cooperative;Pleasant mood Oral Cavity - Dentition: Dentures, bottom;Dentures, top Patient Positioning: Upright in bed  Oral Cavity - Oral Hygiene Does patient have any of the following "at risk" factors?: Diet - patient on thickened liquids;Saliva - thick, dry mouth;Nutritional status - inadequate Patient is AT RISK - Oral Care Protocol followed (see row info): Yes   Dysphagia Treatment Treatment focused on: Skilled observation of diet tolerance;Upgraded PO texture trials;Patient/family/caregiver education Family/Caregiver Educated: spouse, son Treatment Methods/Modalities: Skilled observation;Differential diagnosis Patient observed directly with PO's: Yes Type of PO's observed: Thin liquids;Nectar-thick liquids Feeding: Able to feed self Liquids provided via: Cup;Teaspoon Pharyngeal Phase Signs & Symptoms: Delayed cough (delayed subtle cough x1, frequent belching noted during po) Type of cueing: Verbal Amount of cueing: Minimal   GO     Donavan Burnet, MS [facility] SLP [phone]

## 2013-08-03 NOTE — Progress Notes (Signed)
4 Days Post-Op Procedure(s) (LRB): CORONARY ARTERY BYPASS GRAFTING (CABG) times two on pump using left internal mammary artery and left greater saphenous vein via endovein harvest. (N/A) Subjective: No  Complaints. Ambulated this am.  Objective: Vital signs in last 24 hours: Temp:  [97.7 F (36.5 C)-98.6 F (37 C)] 98.4 F (36.9 C) (08/25 0737) Pulse Rate:  [90-107] 98 (08/25 0700) Cardiac Rhythm:  [-] Normal sinus rhythm (08/25 0400) Resp:  [9-23] 13 (08/25 0700) BP: (74-104)/(39-65) 95/56 mmHg (08/25 0700) SpO2:  [97 %-100 %] 100 % (08/25 0700) Arterial Line BP: (96-116)/(52-69) 116/69 mmHg (08/24 0900) Weight:  [63 kg (138 lb 14.2 oz)] 63 kg (138 lb 14.2 oz) (08/25 0600)  Hemodynamic parameters for last 24 hours: CVP:  [6 mmHg-14 mmHg] 9 mmHg  Intake/Output from previous day: 08/24 0701 - 08/25 0700 In: 781.8 [P.O.:60; I.V.:621.8; IV Piggyback:100] Out: 1885 [Urine:1285; Chest Tube:600] Intake/Output this shift:    General appearance: alert and cooperative Neurologic: intact Heart: regular rate and rhythm, S1, S2 normal, no murmur, click, rub or gallop Lungs: diminished breath sounds LLL Extremities: extremities normal, atraumatic, no cyanosis or edema Wound: incision ok  Lab Results:  Recent Labs  08/02/13 0400 08/03/13 0421  WBC 10.7* 8.0  HGB 8.6* 7.8*  HCT 25.5* 23.0*  PLT 146* 152   BMET:  Recent Labs  08/02/13 0400 08/03/13 0421  NA 133* 135  K 4.5 3.8  CL 101 100  CO2 28 31  GLUCOSE 123* 102*  BUN 30* 27*  CREATININE 0.63 0.69  CALCIUM 8.6 8.5    PT/INR: No results found for this basename: LABPROT, INR,  in the last 72 hours ABG    Component Value Date/Time   PHART 7.336* 07/31/2013 1003   HCO3 22.6 07/31/2013 1003   TCO2 24 07/31/2013 1709   ACIDBASEDEF 3.0* 07/31/2013 1003   O2SAT 88.0 07/31/2013 1003   CBG (last 3)   Recent Labs  08/02/13 1913 08/02/13 2340 08/03/13 0421  GLUCAP 140* 97 97    Assessment/Plan: S/P Procedure(s)  (LRB): CORONARY ARTERY BYPASS GRAFTING (CABG) times two on pump using left internal mammary artery and left greater saphenous vein via endovein harvest. (N/A) His BP is borderline on dop at 3.75. He is anemic so will transfuse to see if that will help to wean the dopamine. Continue chest tube to water seal since still having a lot of drainage.   LOS: 4 days    Evelene Croon K 08/03/2013

## 2013-08-03 NOTE — Progress Notes (Signed)
TCTS BRIEF SICU PROGRESS NOTE  4 Days Post-Op  S/P Procedure(s) (LRB): CORONARY ARTERY BYPASS GRAFTING (CABG) times two on pump using left internal mammary artery and left greater saphenous vein via endovein harvest. (N/A)   Stable day NSR w/ BP 95/65 on dopamine @ 2 UOP adequate  Plan: Continue current plan  Purcell Nails 08/03/2013 7:33 PM

## 2013-08-03 NOTE — Progress Notes (Signed)
Pharmacist Heart Failure Core Measure Documentation  Assessment: Glen Green has an EF documented as 25% on 07/31/13 by ECHO.  Rationale: Heart failure patients with left ventricular systolic dysfunction (LVSD) and an EF < 40% should be prescribed an angiotensin converting enzyme inhibitor (ACEI) or angiotensin receptor blocker (ARB) at discharge unless a contraindication is documented in the medical record.  This patient is not currently on an ACEI or ARB for HF.  This note is being placed in the record in order to provide documentation that a contraindication to the use of these agents is present for this encounter.  ACE Inhibitor or Angiotensin Receptor Blocker is contraindicated (specify all that apply)  []   ACEI allergy AND ARB allergy []   Angioedema []   Moderate or severe aortic stenosis []   Hyperkalemia [x]   Hypotension []   Renal artery stenosis []   Worsening renal function, preexisting renal disease or dysfunction  Glen Green 08/03/2013 9:53 AM

## 2013-08-03 NOTE — Progress Notes (Signed)
NUTRITION FOLLOW-UP  INTERVENTION: Continue Magic cup TID with meals, each supplement provides 290 kcal and 9 grams of protein. Discontinue liquid oral nutrition supplement at this time, pt does not want to continue them. RD to continue to follow nutrition care plan.  NUTRITION DIAGNOSIS: Increased nutrient needs related to post-op healing as evidenced by estimated needs. Ongoing.  Goal: Intake to meet >90% of estimated nutrition needs. Improving.  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 Admitted with SOB, LE weakness, n/v/d. Work-up reveals NSTEMI.  Underwent emergent CABG x 2 on 8/21. Extubated 8/22. Intra-aortic balloon pump removed on 8/23. Speech consulted for pt's persistent wet cough. BSE completed 8/23 with recommendations for Dysphagia 3 diet with Nectar-Thickened liquids.  Pt reports that his appetite is "fair", consuming approximately 50% of meals. He states that he suspects his oral intake will improve further once his diet is advanced.   Height: Ht Readings from Last 1 Encounters:  07/31/13 5\' 10"  (1.778 m)    Weight: Wt Readings from Last 1 Encounters:  08/03/13 138 lb 14.2 oz (63 kg)  Admit wt: 135 lb  BMI:  Body mass index is 19.93 kg/(m^2). WNL  Estimated Nutritional Needs: Kcal: 1650 - 1800 Protein: 70 - 80 g Fluid: 1.6 - 1.8 liters daily  Skin:  Chest incision L leg incision R groin incision  Diet Order: Dysphagia 3; Nectar Thickened Liquids  EDUCATION NEEDS: -No education needs identified at this time   Intake/Output Summary (Last 24 hours) at 08/03/13 0949 Last data filed at 08/03/13 0900  Gross per 24 hour  Intake 709.73 ml  Output   2020 ml  Net -1310.27 ml    Last BM: PTA  Labs:   Recent Labs Lab 07/31/13 0500 07/31/13 1700  08/01/13 0354 08/02/13 0400 08/03/13 0421  NA 141  --   < > 136 133* 135   K 4.3  --   < > 4.3 4.5 3.8  CL 108  --   < > 105 101 100  CO2 25  --   --  26 28 31   BUN 21  --   < > 24* 30* 27*  CREATININE 0.76 0.79  < > 0.69 0.63 0.69  CALCIUM 7.8*  --   --  8.2* 8.6 8.5  MG 3.1* 2.6*  --   --   --   --   GLUCOSE 134*  --   < > 120* 123* 102*  < > = values in this interval not displayed.  CBG (last 3)   Recent Labs  08/02/13 2340 08/03/13 0421 08/03/13 0735  GLUCAP 97 97 76    Scheduled Meds: . acetaminophen  1,000 mg Oral Q6H   Or  . acetaminophen (TYLENOL) oral liquid 160 mg/5 mL  1,000 mg Per Tube Q6H  . antiseptic oral rinse  15 mL Mouth Rinse BID  . aspirin EC  325 mg Oral Daily   Or  . aspirin  324 mg Per Tube Daily  . atorvastatin  80 mg Oral q1800  . bisacodyl  10 mg Oral Daily   Or  . bisacodyl  10 mg Rectal Daily  . dextromethorphan-guaiFENesin  1 tablet Oral BID  . docusate sodium  200 mg Oral Daily  . enoxaparin (LOVENOX) injection  40 mg Subcutaneous QHS  . feeding supplement  237 mL Oral Q24H  . levofloxacin (LEVAQUIN) IV  500 mg Intravenous Daily  .  metoCLOPramide (REGLAN) injection  10 mg Intravenous Q6H  . pantoprazole  40 mg Oral Daily  . sodium chloride  3 mL Intravenous Q12H    Continuous Infusions: . sodium chloride Stopped (08/01/13 1000)  . sodium chloride Stopped (08/01/13 1000)  . sodium chloride    . DOPamine 3.75 mcg/kg/min (08/03/13 0700)  . lactated ringers 20 mL/hr at 08/03/13 0400  . phenylephrine (NEO-SYNEPHRINE) Adult infusion Stopped (08/02/13 1000)    Jarold Motto MS, RD, LDN Pager: [phone] After-hours pager: [phone]

## 2013-08-03 NOTE — Evaluation (Signed)
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 Pt will benefit from skilled PT to address deficits and maximize function in preparation for discharge home with spouse. Will continue to see as indicated and progress activity as tolerated. Rec HHPT upon discharge.    PT Assessment  Patient needs continued PT services    Follow Up Recommendations  Home health PT;Supervision/Assistance - 24 hour          Equipment Recommendations  Rolling walker with 5" wheels       Frequency Min 4X/week    Precautions / Restrictions Precautions Precautions: Sternal Precaution Comments: educated patient at length regarding precautions Restrictions Weight Bearing Restrictions:  (Sternal precautions)   Pertinent Vitals/Pain 4/10 back pain at this time, VSS ambulated on 2 liters SpO2 >94% throughout      Mobility  Bed Mobility Bed Mobility: Supine to Sit;Sitting - Scoot to Edge of Bed Supine to Sit: 3: Mod assist Sitting - Scoot to Edge of Bed: 4: Min assist Details for Bed Mobility Assistance: Assist to elevate trunk and rotate hips  Transfers Transfers: Sit to Stand;Stand to Sit Sit to Stand: 4: Min assist Stand to Sit: 4: Min assist Details for Transfer Assistance: VC for technique and sternal precautions Ambulation/Gait Ambulation/Gait Assistance: 5: Supervision Ambulation Distance (Feet): 210 Feet Assistive device:  (pushing wc) Ambulation/Gait Assistance Details: steady but slow with gait, 1 standing rest break Gait Pattern: Step-through pattern;Decreased stride length;Narrow base of support Gait velocity: decreased General Gait Details: steady    Exercises     PT  Diagnosis: Difficulty walking;Generalized weakness;Acute pain  PT Problem List: Decreased strength;Decreased activity tolerance;Decreased balance;Decreased mobility;Decreased knowledge of use of DME;Cardiopulmonary status limiting activity;Pain PT Treatment Interventions: DME instruction;Gait training;Stair training;Functional mobility training;Therapeutic activities;Therapeutic exercise;Balance training;Patient/family education     PT Goals(Current goals can be found in the care plan section) Acute Rehab PT Goals Patient Stated Goal: to go home PT Goal Formulation: With patient/family Time For Goal Achievement: 08/17/13 Potential to Achieve Goals: Good  Visit Information  Last PT Received On: 08/03/13 Assistance Needed: +1 History of Present Illness: 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       Prior Functioning  Home Living Family/patient expects to be discharged to:: Private residence Living Arrangements: Spouse/significant other Type of Home: House Home Access: Stairs to enter Secretary/administrator of Steps: 3 Entrance Stairs-Rails: Right Home Layout: One level Home Equipment: None Prior Function Level of Independence: Independent Communication Communication: No difficulties Dominant Hand: Right    Cognition  Cognition Arousal/Alertness: Awake/alert Behavior During Therapy: WFL for tasks assessed/performed Overall Cognitive Status: Within Functional Limits for tasks assessed    Extremity/Trunk Assessment Upper Extremity Assessment Upper Extremity Assessment: Defer to OT evaluation Lower Extremity Assessment Lower Extremity Assessment: Generalized weakness   Balance High Level Balance High Level Balance Activites: Side stepping;Backward walking;Direction changes;Turns;Sudden stops;Head turns High Level Balance Comments: good stability  End of Session PT - End of Session Equipment Utilized During Treatment: Gait belt Activity Tolerance: Patient limited by fatigue Patient  left: in chair;with call bell/phone within reach;with family/visitor present Nurse Communication: Mobility status  GP     Fabio Asa 08/03/2013, 2:30 PM Charlotte Crumb, PT DPT  [phone]

## 2013-08-04 ENCOUNTER — Inpatient Hospital Stay (HOSPITAL_COMMUNITY): Payer: Medicare Other

## 2013-08-04 LAB — CBC
HCT: 28.6 % — ABNORMAL LOW (ref 39.0–52.0)
Hemoglobin: 9.5 g/dL — ABNORMAL LOW (ref 13.0–17.0)
RBC: 3.16 MIL/uL — ABNORMAL LOW (ref 4.22–5.81)
WBC: 7.4 10*3/uL (ref 4.0–10.5)

## 2013-08-04 LAB — TYPE AND SCREEN
ABO/RH(D): A POS
Antibody Screen: NEGATIVE

## 2013-08-04 MED ORDER — AMIODARONE HCL IN DEXTROSE 360-4.14 MG/200ML-% IV SOLN
60.0000 mg/h | INTRAVENOUS | Status: AC
  Administered 2013-08-04: 60 mg/h via INTRAVENOUS
  Filled 2013-08-04 (×2): qty 200

## 2013-08-04 MED ORDER — AMIODARONE HCL IN DEXTROSE 360-4.14 MG/200ML-% IV SOLN
30.0000 mg/h | INTRAVENOUS | Status: DC
  Administered 2013-08-05: 30 mg/h via INTRAVENOUS
  Filled 2013-08-04 (×3): qty 200

## 2013-08-04 MED ORDER — AMIODARONE LOAD VIA INFUSION
150.0000 mg | Freq: Once | INTRAVENOUS | Status: AC
  Administered 2013-08-04: 150 mg via INTRAVENOUS
  Filled 2013-08-04: qty 83.34

## 2013-08-04 MED FILL — Heparin Sodium (Porcine) Inj 1000 Unit/ML: INTRAMUSCULAR | Qty: 30 | Status: AC

## 2013-08-04 MED FILL — Sodium Chloride IV Soln 0.9%: INTRAVENOUS | Qty: 1000 | Status: AC

## 2013-08-04 MED FILL — Potassium Chloride Inj 2 mEq/ML: INTRAVENOUS | Qty: 40 | Status: AC

## 2013-08-04 MED FILL — Magnesium Sulfate Inj 50%: INTRAMUSCULAR | Qty: 10 | Status: AC

## 2013-08-04 NOTE — Progress Notes (Signed)
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 Patient able to increase ambulation without rest breaks. Continued to work with patient for education regarding sternal precautions. Patient acknowledges understanding, but continues to require occasional cues. Will continue to work with patient and progress activity as tolerated.   Follow Up Recommendations  Home health PT;Supervision/Assistance - 24 hour     Does the patient have the potential to tolerate intense rehabilitation     Barriers to Discharge        Equipment Recommendations  Rolling walker with 5" wheels    Recommendations for Other Services    Frequency Min 4X/week   Progress towards PT Goals Progress towards PT goals: Progressing toward goals  Plan Current plan remains appropriate    Precautions / Restrictions Precautions Precautions: Sternal Precaution Comments: educated patient at length regarding precautions Restrictions Weight Bearing Restrictions:  (Sternal precautions)   Pertinent Vitals/Pain Patient reports no pain today, VSS    Mobility  Bed Mobility Bed Mobility: Supine to Sit;Sitting - Scoot to Edge of Bed Supine to Sit: 4: Min assist Sitting - Scoot to [place] of Bed: 5: Supervision Details for Bed Mobility Assistance: Assist to elevate trunk and rotate hips  Transfers Transfers: Sit to Stand;Stand to Sit Sit to Stand: 5: Supervision Stand to Sit: 5: Supervision Details for Transfer Assistance: VCs for pillow to prevent use of hands to push Ambulation/Gait Ambulation/Gait Assistance: 5: Supervision Ambulation Distance (Feet): 410 Feet Assistive device:  (pushing wc) Gait Pattern: Step-through  pattern;Decreased stride length;Narrow base of support Gait velocity: improved cadence today General Gait Details: steady        PT Diagnosis: Difficulty walking;Generalized weakness;Acute pain  PT Problem List: Decreased strength;Decreased activity tolerance;Decreased balance;Decreased mobility;Decreased knowledge of use of DME;Cardiopulmonary status limiting activity;Pain PT Treatment Interventions: DME instruction;Gait training;Stair training;Functional mobility training;Therapeutic activities;Therapeutic exercise;Balance training;Patient/family education   PT Goals (current goals can now be found in the care plan section) Acute Rehab PT Goals Patient Stated Goal: to go home PT Goal Formulation: With patient/family Time For Goal Achievement: 08/17/13 Potential to Achieve Goals: Good  Visit Information  Last PT Received On: 08/04/13 Assistance Needed: +1 History of Present Illness: 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    Subjective Data  Subjective: I feel like I am doing better Patient Stated Goal: to go home   Cognition  Cognition Arousal/Alertness: Awake/alert Behavior During Therapy: WFL for tasks assessed/performed Overall Cognitive Status: Within Functional Limits for tasks assessed    Balance  High Level Balance High Level Balance Activites: Side stepping;Backward walking;Direction changes;Turns;Sudden stops;Head turns High Level Balance Comments: good stability  End of Session PT - End of Session Equipment Utilized During Treatment: Gait belt Activity Tolerance: Patient limited by fatigue Patient left: in chair;with call bell/phone within reach;with family/visitor present Nurse Communication: Mobility status   GP     Fabio Asa 08/04/2013, 1:36 PM Charlotte Crumb, PT DPT  [phone]

## 2013-08-04 NOTE — Progress Notes (Signed)
Speech Language Pathology Dysphagia Treatment Patient Details Name: Glen Green MRN: [id number] DOB: 1941-10-28 Today's Date: 08/04/2013 Time: 4098-1191 SLP Time Calculation (min): 15 min  Assessment / Plan / Recommendation Clinical Impression  Pt seen for skilled SLP treatment to assess tolerance of dietary advancement.  Pt observed eating a few bites of lunch today, swallow was timely with clear voice and no indications of aspiration.  Pt and spouse admit that congested cough is dissipating.    Rec advance to regular diet with intemittent supervision only.  SLP provided family and pt with tips to mitigate aspiration with respiratory issues and compensation strategies.  Also provided written heimlich maneuver for use if needed.    No further SLP indicated as swallow ability appears to be at baseline and all education completed.    SLP did not observe pt with straw but do not anticipate need to restrict usage      Diet Recommendation  Initiate / Change Diet: Regular;Thin liquid    SLP Plan All goals met   Pertinent Vitals/Pain Afebrile, decreased   Swallowing Goals  SLP Swallowing Goals Swallow Study Goal #2 - Progress: Met  General Temperature Spikes Noted: No Respiratory Status: Supplemental O2 delivered via (comment) Behavior/Cognition: Alert;Cooperative;Pleasant mood Oral Cavity - Dentition: Dentures, top;Dentures, bottom Patient Positioning: Upright in chair  Oral Cavity - Oral Hygiene Does patient have any of the following "at risk" factors?: Oxygen therapy - cannula, mask, simple oxygen devices   Dysphagia Treatment Treatment focused on: Skilled observation of diet tolerance;Patient/family/caregiver Dealer Educated: spouse, pt Treatment Methods/Modalities: Skilled observation Patient observed directly with PO's: Yes Type of PO's observed: Dysphagia 3 (soft);Thin liquids Feeding: Able to feed self Liquids provided via: Cup   GO    Donavan Burnet, MS [facility] SLP [phone]

## 2013-08-04 NOTE — Progress Notes (Signed)
Pt converted into rapid afib with a HR in the 130-140's. MD Dorris Fetch made aware- new orders received for amio load and infusion. Will monitor   Glen Green

## 2013-08-04 NOTE — Progress Notes (Signed)
POD # 5 CABG  Resting comfortably  BP 103/61  Pulse 49  Temp(Src) 97.7 F (36.5 C) (Oral)  Resp 19  Ht 5\' 10"  (1.778 m)  Wt 137 lb 12.6 oz (62.5 kg)  BMI 19.77 kg/m2  SpO2 91%   Intake/Output Summary (Last 24 hours) at 08/04/13 1759 Last data filed at 08/04/13 1700  Gross per 24 hour  Intake 761.65 ml  Output   1536 ml  Net -774.35 ml    Off dopamine  No PM labs

## 2013-08-04 NOTE — Progress Notes (Addendum)
      [address]       [place] [address]             [phone]      5 Days Post-Op Procedure(s) (LRB): CORONARY ARTERY BYPASS GRAFTING (CABG) times two on pump using left internal mammary artery and left greater saphenous vein via endovein harvest. (N/A)  Subjective:  Mr. Baumbach is without complaints this morning.  He is ambulating.  Objective: Vital signs in last 24 hours: Temp:  [97.7 F (36.5 C)-98.6 F (37 C)] 97.9 F (36.6 C) (08/26 0405) Pulse Rate:  [90-113] 98 (08/26 0500) Cardiac Rhythm:  [-] Normal sinus rhythm (08/26 0600) Resp:  [10-35] 16 (08/26 0700) BP: (83-110)/(50-77) 99/63 mmHg (08/26 0700) SpO2:  [91 %-100 %] 100 % (08/26 0600) Weight:  [137 lb 12.6 oz (62.5 kg)] 137 lb 12.6 oz (62.5 kg) (08/26 0500)   Intake/Output from previous day: 08/25 0701 - 08/26 0700 In: 904.3 [I.V.:554.3; Blood:350] Out: 1796 [Urine:1285; Stool:1; Chest Tube:510]  General appearance: alert, cooperative and no distress Heart: regular rate and rhythm Lungs: diminished breath sounds LLL Abdomen: soft, non-tender; bowel sounds normal; no masses,  no organomegaly Extremities: edema trace Wound: clean and dry  Lab Results:  Recent Labs  08/03/13 0421 08/04/13 0458  WBC 8.0 7.4  HGB 7.8* 9.5*  HCT 23.0* 28.6*  PLT 152 194   BMET:  Recent Labs  08/02/13 0400 08/03/13 0421  NA 133* 135  K 4.5 3.8  CL 101 100  CO2 28 31  GLUCOSE 123* 102*  BUN 30* 27*  CREATININE 0.63 0.69  CALCIUM 8.6 8.5    PT/INR: No results found for this basename: LABPROT, INR,  in the last 72 hours ABG    Component Value Date/Time   PHART 7.336* 07/31/2013 1003   HCO3 22.6 07/31/2013 1003   TCO2 24 07/31/2013 1709   ACIDBASEDEF 3.0* 07/31/2013 1003   O2SAT 88.0 07/31/2013 1003   CBG (last 3)   Recent Labs  08/03/13 0421 08/03/13 0735 08/03/13 1157  GLUCAP 97 76 121*    Assessment/Plan: S/P Procedure(s) (LRB): CORONARY ARTERY BYPASS GRAFTING (CABG) times two  on pump using left internal mammary artery and left greater saphenous vein via endovein harvest. (N/A)  1. CV- NSR, hypotension- remains on Dopamine at 2 mcg/min 2. Pulm- wean oxygen as tolerated, CXR stable- continue current respiratory care 3. Chest tube- 40 cc output d/c today vs. Tomorrow 4. Renal- creatinine lytes have been okay, weight is at baseline 5. Expected Acute Blood Loss Anemia- hgb improved to 9.5 after transfusion 6. Dispo- stable, continue current care   LOS: 5 days    Lowella Dandy 08/04/2013   Chart reviewed, patient examined, agree with above. Chest tube output 470 cc yesterday. Will keep it in for another day. Continue to wean dopamine.

## 2013-08-05 ENCOUNTER — Inpatient Hospital Stay (HOSPITAL_COMMUNITY): Payer: Medicare Other

## 2013-08-05 LAB — CULTURE, BLOOD (ROUTINE X 2): Culture: NO GROWTH

## 2013-08-05 MED ORDER — SODIUM CHLORIDE 0.9 % IJ SOLN
3.0000 mL | Freq: Two times a day (BID) | INTRAMUSCULAR | Status: DC
  Administered 2013-08-05 – 2013-08-06 (×3): 3 mL via INTRAVENOUS

## 2013-08-05 MED ORDER — SODIUM CHLORIDE 0.9 % IV SOLN: 250.0000 mL | INTRAVENOUS | Status: DC | PRN

## 2013-08-05 MED ORDER — MOVING RIGHT ALONG BOOK
Freq: Once | Status: AC
Start: 2013-08-05 — End: 2013-08-05
  Administered 2013-08-05: 18:00:00
  Filled 2013-08-05: qty 1

## 2013-08-05 MED ORDER — SODIUM CHLORIDE 0.9 % IJ SOLN: 3.0000 mL | INTRAMUSCULAR | Status: DC | PRN

## 2013-08-05 MED ORDER — AMIODARONE HCL 200 MG PO TABS
400.0000 mg | ORAL_TABLET | Freq: Two times a day (BID) | ORAL | Status: DC
  Administered 2013-08-05 – 2013-08-11 (×13): 400 mg via ORAL
  Filled 2013-08-05 (×14): qty 2

## 2013-08-05 NOTE — Progress Notes (Signed)
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 Discussed mobility expectations for patient as he moves to step down unit. Will continue to see with plan for stair negotiation and continued ambulation. Anticipate patient will continue to progress well.   Follow Up Recommendations  Home health PT;Supervision/Assistance - 24 hour     Does the patient have the potential to tolerate intense rehabilitation     Barriers to Discharge        Equipment Recommendations  Rolling walker with 5" wheels vs (Rollator walker for energy conservation)    Recommendations for Other Services    Frequency Min 4X/week   Progress towards PT Goals Progress towards PT goals: Progressing toward goals  Plan Current plan remains appropriate    Precautions / Restrictions Precautions Precautions: Sternal Precaution Comments: educated patient at length regarding precautions Restrictions Weight Bearing Restrictions:  (Sternal precautions)   Pertinent Vitals/Pain No pain this am    Mobility  Bed Mobility Bed Mobility: Sit to Supine Sit to Supine: 4: Min assist Details for Bed Mobility Assistance: Assist for support with sternal precautions Transfers Transfers: Sit to Stand;Stand to Sit Sit to Stand: 5: Supervision Stand to Sit: 5: Supervision Details for Transfer Assistance: VCs for pillow to prevent use of hands to push Ambulation/Gait Ambulation/Gait Assistance: 5: Supervision Ambulation Distance (Feet): 410 Feet Assistive device:  (pushing wc) Ambulation/Gait Assistance Details: one rest break, no O2 use today, good stability Gait Pattern: Step-through  pattern;Decreased stride length;Narrow base of support Gait velocity: improved cadence today General Gait Details: steady        PT Diagnosis: Difficulty walking;Generalized weakness;Acute pain  PT Problem List: Decreased strength;Decreased activity tolerance;Decreased balance;Decreased mobility;Decreased knowledge of use of DME;Cardiopulmonary status limiting activity;Pain PT Treatment Interventions: DME instruction;Gait training;Stair training;Functional mobility training;Therapeutic activities;Therapeutic exercise;Balance training;Patient/family education   PT Goals (current goals can now be found in the care plan section) Acute Rehab PT Goals Patient Stated Goal: to go home PT Goal Formulation: With patient/family Time For Goal Achievement: 08/17/13 Potential to Achieve Goals: Good  Visit Information  Last PT Received On: 08/05/13 Assistance Needed: +1 History of Present Illness: 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    Subjective Data  Subjective: I did not sleep well last night Patient Stated Goal: to go home   Cognition  Cognition Arousal/Alertness: Awake/alert Behavior During Therapy: WFL for tasks assessed/performed Overall Cognitive Status: Within Functional Limits for tasks assessed    Balance  High Level Balance High Level Balance Activites: Side stepping;Backward walking;Direction changes;Turns;Sudden stops;Head turns High Level Balance Comments: good stability  End of Session PT - End of Session Equipment Utilized During Treatment: Gait belt Activity Tolerance: Patient limited by fatigue Patient left: in chair;with call bell/phone within reach;with family/visitor present Nurse Communication: Mobility status   GP     Fabio Asa 08/05/2013, 9:29 AM Charlotte Crumb, PT DPT  [phone]

## 2013-08-05 NOTE — Progress Notes (Addendum)
      [address]       [place] [address]             [phone]      6 Days Post-Op Procedure(s) (LRB): CORONARY ARTERY BYPASS GRAFTING (CABG) times two on pump using left internal mammary artery and left greater saphenous vein via endovein harvest. (N/A)  Subjective:  Mr. Glen Green states he is tired this morning.  States he only slept about 30 min last night.  He is hopeful to get out of the hospital soon.  Objective: Vital signs in last 24 hours: Temp:  [97.2 F (36.2 C)-98.2 F (36.8 C)] 97.9 F (36.6 C) (08/27 0750) Pulse Rate:  [25-106] 89 (08/27 0530) Cardiac Rhythm:  [-] Normal sinus rhythm;Bundle branch block (08/27 0700) Resp:  [12-28] 17 (08/27 0730) BP: (78-122)/(48-81) 101/62 mmHg (08/27 0745) SpO2:  [91 %-100 %] 100 % (08/27 0700) Weight:  [138 lb 14.2 oz (63 kg)] 138 lb 14.2 oz (63 kg) (08/27 0645)  Intake/Output from previous day: 08/26 0701 - 08/27 0700 In: 1180.8 [P.O.:240; I.V.:940.8] Out: 1545 [Urine:1225; Chest Tube:320]  General appearance: alert, cooperative and no distress Heart: regular rate and rhythm Lungs: clear to auscultation bilaterally Abdomen: soft, non-tender; bowel sounds normal; no masses,  no organomegaly Extremities: edema none appreciated Wound: clean and dr  Lab Results:  Recent Labs  08/03/13 0421 08/04/13 0458  WBC 8.0 7.4  HGB 7.8* 9.5*  HCT 23.0* 28.6*  PLT 152 194   BMET:  Recent Labs  08/03/13 0421  NA 135  K 3.8  CL 100  CO2 31  GLUCOSE 102*  BUN 27*  CREATININE 0.69  CALCIUM 8.5    PT/INR: No results found for this basename: LABPROT, INR,  in the last 72 hours ABG    Component Value Date/Time   PHART 7.336* 07/31/2013 1003   HCO3 22.6 07/31/2013 1003   TCO2 24 07/31/2013 1709   ACIDBASEDEF 3.0* 07/31/2013 1003   O2SAT 88.0 07/31/2013 1003   CBG (last 3)   Recent Labs  08/03/13 0421 08/03/13 0735 08/03/13 1157  GLUCAP 97 76 121*    Assessment/Plan: S/P Procedure(s)  (LRB): CORONARY ARTERY BYPASS GRAFTING (CABG) times two on pump using left internal mammary artery and left greater saphenous vein via endovein harvest. (N/A)  1. CV- Atrial Fibrillation overnight, currently NSR rate in the 80s- off Dopamine, on IV Amiodarone, will convert to oral regimen, currently not on Beta Blocker 2. Pulm- no acute issues, off oxygen, improved air space on disease on CXR this morning, continue respiratory care 3. Renal- weight is stable 4.  Chest tube-360 cc output yesterday, management per staff 5. Dispo- patient stable, will convert IV Amiodarone to oral, currently not on Beta Blocker will discuss possible start with Dr. Laneta Simmers, ? Transfer to 2000   LOS: 6 days    Lowella Dandy 08/05/2013   Chart reviewed, patient examined, agree with above. He looks fairly good overall and I think he can go to 2000. Will keep chest tube in today since he had 360/24 hrs. He has not had much out overnight but the tubing is filled with fluid this am. He had low EF preop so he will benefit from low dose beta-blocker and ACE-I but BP is marginal now off dopamine so will wait to start these. Amio will give him some beta-blocker effect. He will need follow-up echo later.

## 2013-08-05 NOTE — Plan of Care (Signed)
Problem: Phase III Progression Outcomes Goal: Time patient transferred to PCTU/Telemetry POD Outcome: Completed/Met Date Met:  08/05/13 Transferred to [facility] room 23 via wheelchair with propak.  Tolerated transfer well.  Vitals stable.

## 2013-08-06 ENCOUNTER — Inpatient Hospital Stay (HOSPITAL_COMMUNITY): Payer: Medicare Other

## 2013-08-06 LAB — BASIC METABOLIC PANEL
BUN: 28 mg/dL — ABNORMAL HIGH (ref 6–23)
Chloride: 98 mEq/L (ref 96–112)
Glucose, Bld: 109 mg/dL — ABNORMAL HIGH (ref 70–99)
Potassium: 3.7 mEq/L (ref 3.5–5.1)
Sodium: 136 mEq/L (ref 135–145)

## 2013-08-06 LAB — CBC
HCT: 30.9 % — ABNORMAL LOW (ref 39.0–52.0)
Hemoglobin: 10.4 g/dL — ABNORMAL LOW (ref 13.0–17.0)
RBC: 3.47 MIL/uL — ABNORMAL LOW (ref 4.22–5.81)
WBC: 8.2 10*3/uL (ref 4.0–10.5)

## 2013-08-06 MED ORDER — LEVOFLOXACIN 500 MG PO TABS
500.0000 mg | ORAL_TABLET | Freq: Every day | ORAL | Status: AC
  Administered 2013-08-06 – 2013-08-07 (×2): 500 mg via ORAL
  Filled 2013-08-06 (×2): qty 1

## 2013-08-06 NOTE — Progress Notes (Signed)
CARDIAC REHAB PHASE I   PRE:  Rate/Rhythm: 87 SR    BP: sitting 98/56    SaO2: 97 RA  MODE:  Ambulation: 400 ft   POST:  Rate/Rhythm: 103 ST    BP: sitting 97/57     SaO2: 100 RA  Pt moving well with RW. Needs rest break every 100 ft or so due to DOE. Sts this walk was further than this morning and is fatigued at end of walk. VSS. Will continue to follow. Encouraged IS and walk x1 more today. [id number]   Elissa Lovett [place] CES, ACSM 08/06/2013 12:05 PM

## 2013-08-06 NOTE — Progress Notes (Signed)
Assessed per flow sheet. Denies chest pain or shortness of breath. Midline incision intact with no s/s of infection; painted with betadine. Chest tube remains intact to water seal. No air leak noted. [place] collection system changed. Encouraged use of IS 10x/hr while awake; verbalized understanding. Encouraged ambulation at least 3x/day. PT to room to ambulate with patient.Mamie Levers

## 2013-08-06 NOTE — Progress Notes (Signed)
Physical Therapy Treatment Patient Details Name: Glen Green MRN: [id number] DOB: Nov 16, 1941 Today's Date: 08/06/2013 Time: 4098-1191 PT Time Calculation (min): 24 min  PT Assessment / Plan / Recommendation  History of Present Illness 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   PT Comments   Patient continues to make steady progress towards PT goals. Patient able to increase ambulation today with minimal rest breaks. Spoke with patient at length regarding expectations for mobility as well as importance of IS use. Will continue to see as indicated. Anticipate patient will be safe for dc home with HHPT when medically cleared.    Follow Up Recommendations  Home health PT;Supervision/Assistance - 24 hour     Does the patient have the potential to tolerate intense rehabilitation     Barriers to Discharge        Equipment Recommendations  Rolling walker with 5" wheels    Recommendations for Other Services    Frequency Min 4X/week   Progress towards PT Goals Progress towards PT goals: Progressing toward goals  Plan Current plan remains appropriate    Precautions / Restrictions Precautions Precautions: Sternal Precaution Comments: educated patient at length regarding precautions Restrictions Weight Bearing Restrictions:  (Sternal precautions)   Pertinent Vitals/Pain Patient reports no pain at this time    Mobility  Bed Mobility Bed Mobility: Supine to Sit;Sitting - Scoot to [place] of Bed;Sit to Supine Supine to Sit: 5: Supervision Sitting - Scoot to Edge of Bed: 5: Supervision Sit to Supine: 4: Min assist Details for Bed Mobility Assistance: Assist for support with sternal precautions Transfers Transfers: Sit to Stand;Stand to Sit Sit to Stand: 6: Modified independent (Device/Increase time) Stand to Sit: 6: Modified independent (Device/Increase time) Details for Transfer Assistance: increased time to perform, no physical assist or cues needed Ambulation/Gait Ambulation/Gait Assistance: 5:  Supervision Ambulation Distance (Feet): 600 Feet Assistive device: Rolling walker Ambulation/Gait Assistance Details: 3 standing rest breaks approx 1 minute each, SpO2 >93% on rm air, HR stable Gait Pattern: Step-through pattern;Decreased stride length;Narrow base of support Gait velocity: improved cadence today General Gait Details: steady    Exercises General Exercises - Lower Extremity Ankle Circles/Pumps: AROM;Both;20 reps Other Exercises Other Exercises: Encouraged continued use of IS, with good control   PT Diagnosis:    PT Problem List:   PT Treatment Interventions:     PT Goals (current goals can now be found in the care plan section) Acute Rehab PT Goals Patient Stated Goal: to go home PT Goal Formulation: With patient/family Time For Goal Achievement: 08/17/13 Potential to Achieve Goals: Good  Visit Information  Last PT Received On: 08/06/13 Assistance Needed: +1 History of Present Illness: 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    Subjective Data  Subjective: I slept a little better last night Patient Stated Goal: to go home   Cognition  Cognition Arousal/Alertness: Awake/alert Behavior During Therapy: WFL for tasks assessed/performed Overall Cognitive Status: Within Functional Limits for tasks assessed    Balance  Balance Balance Assessed: Yes High Level Balance High Level Balance Activites: Side stepping;Backward walking;Direction changes;Turns;Sudden stops;Head turns High Level Balance Comments: good stability  End of Session PT - End of Session Equipment Utilized During Treatment: Gait belt Activity Tolerance: Patient limited by fatigue Patient left: in bed;with call bell/phone within reach Nurse Communication: Mobility status   GP     Fabio Asa 08/06/2013, 8:56 AM Charlotte Crumb, PT DPT  [phone]

## 2013-08-06 NOTE — Progress Notes (Addendum)
       [address]       [place] [address]             [phone]          7 Days Post-Op Procedure(s) (LRB): CORONARY ARTERY BYPASS GRAFTING (CABG) times two on pump using left internal mammary artery and left greater saphenous vein via endovein harvest. (N/A)  Subjective: Feels well, no complaints except soreness.   Objective: Vital signs in last 24 hours: Patient Vitals for the past 24 hrs:  BP Temp Temp src Pulse Resp SpO2 Weight  08/06/13 0537 108/73 mmHg 97.7 F (36.5 C) Oral 89 - 97 % -  08/06/13 0438 - - - - - - 136 lb 6.4 oz (61.871 kg)  08/05/13 2119 105/70 mmHg 98.5 F (36.9 C) Oral 83 18 98 % -  08/05/13 1330 105/66 mmHg 97.7 F (36.5 C) Oral 86 17 95 % -  08/05/13 1200 99/59 mmHg - - - 20 100 % -  08/05/13 1100 103/64 mmHg 97.5 F (36.4 C) Oral - 21 - -  08/05/13 0900 105/59 mmHg - - 46 27 56 % -  08/05/13 0800 105/62 mmHg - - 83 20 99 % -   Current Weight  08/06/13 136 lb 6.4 oz (61.871 kg)     Intake/Output from previous day: 08/27 0701 - 08/28 0700 In: 450.1 [P.O.:180; I.V.:170.1; IV Piggyback:100] Out: 998 [Urine:50; Chest Tube:948]    PHYSICAL EXAM:  Heart: RRR Lungs: Slightly decreased BS in bases Wound: Clean and dry Extremities: No significant LE edema    Lab Results: CBC: Recent Labs  08/04/13 0458 08/06/13 0400  WBC 7.4 8.2  HGB 9.5* 10.4*  HCT 28.6* 30.9*  PLT 194 340   BMET:  Recent Labs  08/06/13 0400  NA 136  K 3.7  CL 98  CO2 28  GLUCOSE 109*  BUN 28*  CREATININE 0.93  CALCIUM 8.8    PT/INR: No results found for this basename: LABPROT, INR,  in the last 72 hours  CXR: stable small left effusion   Assessment/Plan: S/P Procedure(s) (LRB): CORONARY ARTERY BYPASS GRAFTING (CABG) times two on pump using left internal mammary artery and left greater saphenous vein via endovein harvest. (N/A) CV- maintaining SR, SBPs remain borderline.  Continue Amio, will hold off on beta blocker until  BPs improved. CT output >94ml/past 24 hrs (300/past shift).  Will leave CT for now until seen by MD. ID- on IV Levaquin D#6 for ?bronchitis. Will change to po and complete 7 day course. Afebrile and WBC stable. Cough nearly resolved. Continue IS/pulm toilet, CRPI. Hopefully will be ready for discharge this weekend if he continues to progress.    LOS: 7 days    COLLINS,GINA H 08/06/2013   Chart reviewed, patient examined, agree with above. His chest xray looks ok. He still has a lot of chest tube output and this will have to stay in until it decreases. I suspect it is related to heart failure. He may need talc if it persists. He needs to be seen by the cardiologists because I think he will need to be followed in the heart failure clinic. His preop EF was poor and the anterior wall is thin scar.

## 2013-08-07 MED ORDER — SODIUM CHLORIDE 0.9 % IV SOLN
3.0000 g | Freq: Once | INTRAVENOUS | Status: AC
  Administered 2013-08-07: 3 g via INTRAPLEURAL
  Filled 2013-08-07: qty 3

## 2013-08-07 NOTE — Progress Notes (Signed)
EPW removed per order/protocol with ends intact. No bleeding or ectopy noted. Patient tolerated well. Patient instructed to notify RN of any s/s of distress and to remain supine in bed x1hr. Patient verbalized understanding. VSS. Call bell and wife near. [place] Green

## 2013-08-07 NOTE — Progress Notes (Addendum)
Came to ambulate and ed however pt awaiting talc procedure. Began ed process with pt, wife and family. Wife overwhelmed by diet changes pt needs to make. Unable to finish ed due pts pain level after procedure. Gave HF booklet. Will f/u for the rest of ed and ambulation as pain allows. [id number] Ethelda Chick CES, ACSM 1:54 PM 08/07/2013  Returned and offered walk, pt sts he is too tired right now and will try to walk with nursing later today. Discussed [facility] and will send referral to [place]. Ethelda Chick CES, ACSM 2:53 PM 08/07/2013

## 2013-08-07 NOTE — Progress Notes (Addendum)
       [address]       [place] [address]             [phone]          8 Days Post-Op Procedure(s) (LRB): CORONARY ARTERY BYPASS GRAFTING (CABG) times two on pump using left internal mammary artery and left greater saphenous vein via endovein harvest. (N/A)  Subjective: Feels well, no complaints.   Objective: Vital signs in last 24 hours: Patient Vitals for the past 24 hrs:  BP Temp Temp src Pulse Resp SpO2 Weight  08/07/13 0431 102/63 mmHg 98.4 F (36.9 C) Oral 82 18 94 % 134 lb 3.2 oz (60.873 kg)  08/06/13 1953 110/50 mmHg 97.3 F (36.3 C) Oral 77 18 99 % -  08/06/13 1345 90/52 mmHg 98.5 F (36.9 C) Oral 87 18 - -   Current Weight  08/07/13 134 lb 3.2 oz (60.873 kg)     Intake/Output from previous day: 08/28 0701 - 08/29 0700 In: 360 [P.O.:360] Out: 950 [Urine:400; Chest Tube:550]    PHYSICAL EXAM:  Heart: RRR Lungs: Few basilar crackles Wound: Clean and dry Extremities: No LE edema    Lab Results: CBC: Recent Labs  08/06/13 0400  WBC 8.2  HGB 10.4*  HCT 30.9*  PLT 340   BMET:  Recent Labs  08/06/13 0400  NA 136  K 3.7  CL 98  CO2 28  GLUCOSE 109*  BUN 28*  CREATININE 0.93  CALCIUM 8.8    PT/INR: No results found for this basename: LABPROT, INR,  in the last 72 hours    Assessment/Plan: S/P Procedure(s) (LRB): CORONARY ARTERY BYPASS GRAFTING (CABG) times two on pump using left internal mammary artery and left greater saphenous vein via endovein harvest. (N/A) CV- SBPs remain low normal, will continue Amio and hold off on starting beta blocker for now. CT output remains high.  There is no drainage documented for first shift yesterday and 550 for overnight, but there is 850 in the Pleuravac, which was changed out yesterday.  Continue CT.  May need talc pleurodesis. CHF- cardiology to see pt re: meds and outpatient followup. ID- D#7/7 Levaquin for presumed bronchitis.  Continue current care.  Home once CT out  and CHF stable.   LOS: 8 days    COLLINS,GINA H 08/07/2013   Chart reviewed, patient examined, agree with above. He has had 900 cc out since pleurevac changed yesterday. I think we should try some talc. I think this effusion is related to CHF and will probably be a persistent problem if we don't resolve it now.

## 2013-08-07 NOTE — Progress Notes (Signed)
Chest tube unclamped per order. Ambulated 220ft in hallway using walker. Stopped x3 to rest. Returned to bed 2W22. Call bell near. Will monitor.Mamie Levers

## 2013-08-07 NOTE — Progress Notes (Signed)
Physical Therapy Treatment Patient Details Name: KALIJAH ZEISS MRN: [id number] DOB: 12-10-1941 Today's Date: 08/07/2013 Time: 7829-5621 PT Time Calculation (min): 26 min  PT Assessment / Plan / Recommendation  History of Present Illness 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   PT Comments   Pt with excellent progression with mobility without RW. Pt educated for precaution and HEp and encouraged to continue along with gait.   Follow Up Recommendations  Home health PT     Does the patient have the potential to tolerate intense rehabilitation     Barriers to Discharge        Equipment Recommendations  None recommended by PT    Recommendations for Other Services    Frequency Min 3X/week   Progress towards PT Goals Progress towards PT goals: Progressing toward goals  Plan Frequency needs to be updated    Precautions / Restrictions Precautions Precautions: Sternal Precaution Comments: educated with teach back and handout for precautions Restrictions Weight Bearing Restrictions:  (Sternal precautions)   Pertinent Vitals/Pain HR 82-93 throughout    Mobility  Bed Mobility Bed Mobility: Not assessed Transfers Sit to Stand: 5: Supervision;From chair/3-in-1 Stand to Sit: 5: Supervision;To chair/3-in-1 Details for Transfer Assistance: cueing for hand placement to maintain precautions Ambulation/Gait Ambulation/Gait Assistance: 5: Supervision Ambulation Distance (Feet): 450 Feet Assistive device: None Ambulation/Gait Assistance Details: 2 standing rest breaks and performed well without LOB with no AD Gait Pattern: Step-through pattern;Decreased stride length Gait velocity: decreased Stairs: Yes Stairs Assistance: 6: Modified independent (Device/Increase time) Stair Management Technique: One rail Left;Alternating pattern;Forwards Number of Stairs: 3    Exercises General Exercises - Lower Extremity Long Arc Quad: AROM;Both;20 reps;Seated Hip Flexion/Marching: AROM;Both;20  reps;Seated Toe Raises: AROM;Both;20 reps;Seated Heel Raises: AROM;Both;20 reps;Seated   PT Diagnosis:    PT Problem List:   PT Treatment Interventions:     PT Goals (current goals can now be found in the care plan section)    Visit Information  Last PT Received On: 08/07/13 Assistance Needed: +1 History of Present Illness: 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    Subjective Data      Cognition  Cognition Arousal/Alertness: Awake/alert Behavior During Therapy: WFL for tasks assessed/performed Overall Cognitive Status: Within Functional Limits for tasks assessed    Balance     End of Session PT - End of Session Equipment Utilized During Treatment: Gait belt Activity Tolerance: Patient tolerated treatment well Patient left: in chair;with call bell/phone within reach;with nursing/sitter in room   GP     Delorse Lek 08/07/2013, 9:35 AM Delaney Meigs, PT [phone]

## 2013-08-07 NOTE — Progress Notes (Signed)
      [address]       [place] [address]             [phone]       Procedure Note:  3 g talc slurry instilled via right chest tube without difficulty.  Patient tolerated well. Chest tube will remain clamped x 4 hours.    Coral Ceo, PA-C 08/07/2013 1:49 pm

## 2013-08-07 NOTE — Progress Notes (Signed)
Improved. Feeling better. Pleurodesed   Wife, Glen Green, is a patient of mine and this morning when Dr. Elease Hashimoto came to see him she requested me. He called me and requested that I see him.  Hopefully he will be ready to go to home soon.  Continue with amiodarone, aspirin. Hopefully we'll be able to discontinue amiodarone soon.  Post bypass x2, on pump, LIMA, SVG.  Completing course of Levaquin.  I will be happy to follow up with him in outpatient (at his request). Continue with heart failure management.  When blood pressure allows, we will try to have low-dose beta blocker, ACE inhibitor, traditional heart failure regimen.  I also think that low-dose Lasix may be necessary. BP soft. Currently appears fairly euvolemic.

## 2013-08-07 NOTE — Progress Notes (Signed)
NUTRITION FOLLOW-UP  INTERVENTION: 1. Pt wife to bring in foods for pt   2. Pt declined any oral nutrition supplements   NUTRITION DIAGNOSIS: Increased nutrient needs related to post-op healing as evidenced by estimated needs. Ongoing.  Goal: Intake to meet >90% of estimated nutrition needs. Improving.  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 Admitted with SOB, LE weakness, n/v/d. Work-up reveals NSTEMI.  Underwent emergent CABG x 2 on 8/21. Extubated 8/22. Intra-aortic balloon pump removed on 8/23. Speech consulted for pt's persistent wet cough. BSE completed 8/23 with recommendations for Dysphagia 3 diet with Nectar-Thickened liquids.   Diet was advanced to heart healthy with out thickened liquids on 8/26. Pt continues to eat poorly related to not liking the foods provided. Wife spoke with Cards MD about bringing in foods or getting meals from [employer] and per her report that would be ok. Encouraged low sodium choices. Pt declined any additional oral nutrition supplements.   Height: Ht Readings from Last 1 Encounters:  07/31/13 5\' 10"  (1.778 m)    Weight: Wt Readings from Last 1 Encounters:  08/07/13 134 lb 3.2 oz (60.873 kg)  Admit wt: 135 lb  BMI:  Body mass index is 19.26 kg/(m^2). WNL  Estimated Nutritional Needs: Kcal: 1650 - 1800 Protein: 70 - 80 g Fluid: 1.6 - 1.8 liters daily  Skin:  Chest incision L leg incision R groin incision  Diet Order: Cardiac  EDUCATION NEEDS: -No education needs identified at this time   Intake/Output Summary (Last 24 hours) at 08/07/13 1204 Last data filed at 08/07/13 1100  Gross per 24 hour  Intake    360 ml  Output    550 ml  Net   -190 ml    Last BM: 8/28  Labs:   Recent Labs Lab 07/31/13 1700  08/02/13 0400 08/03/13 0421 08/06/13 0400  NA  --   < > 133* 135 136  K  --   < > 4.5 3.8  3.7  CL  --   < > 101 100 98  CO2  --   < > 28 31 28   BUN  --   < > 30* 27* 28*  CREATININE 0.79  < > 0.63 0.69 0.93  CALCIUM  --   < > 8.6 8.5 8.8  MG 2.6*  --   --   --   --   GLUCOSE  --   < > 123* 102* 109*  < > = values in this interval not displayed.  CBG (last 3)  No results found for this basename: GLUCAP,  in the last 72 hours  Scheduled Meds: . amiodarone  400 mg Oral BID  . aspirin EC  325 mg Oral Daily   Or  . aspirin  324 mg Per Tube Daily  . atorvastatin  80 mg Oral q1800  . bisacodyl  10 mg Oral Daily   Or  . bisacodyl  10 mg Rectal Daily  . dextromethorphan-guaiFENesin  1 tablet Oral BID  . docusate sodium  200 mg Oral Daily  . enoxaparin (LOVENOX) injection  40 mg Subcutaneous QHS  . pantoprazole  40 mg Oral Daily  . talc slurry in NS +/- lidocaine  3 g Intrapleural Once    Continuous Infusions: none at this time      Clarene Duke RD, LDN Pager [phone] After Hours pager [phone]

## 2013-08-08 LAB — CREATININE, SERUM
Creatinine, Ser: 0.8 mg/dL (ref 0.50–1.35)
GFR calc Af Amer: >90 mL/min (ref >90)
GFR calc non Af Amer: 88 mL/min — ABNORMAL LOW (ref >90)

## 2013-08-08 NOTE — Progress Notes (Signed)
Pt ambulated in hall 150 ft with rolling walker and nurse.  Pt took 1 standing rest break. No complaints of pain. Back to bed for the night, call bell in reach, pt verbalizes to call for assist if needing OOB.

## 2013-08-08 NOTE — Progress Notes (Signed)
Pt attempted to stand from chair to ambulate at this time; pt c/o pain at chest tube site that hinders him from standing up at this time; pt states he is not able to ambulate now; will give pan meds and attempt ambulation at a later time; will cont. To monitor.

## 2013-08-08 NOTE — Progress Notes (Signed)
CARDIAC REHAB PHASE I   PRE:  Rate/Rhythm: 81 sinus rhythm  BP:  Supine:  Sitting:  97/57 Standing:   SaO2: 92% ra  MODE:  Ambulation: 100 ft   POST:  Rate/Rhythem: 97   BP:  Supine:   Sitting: 107/57  Standing:    SaO2: 94% ra 1315-1350 Pt ambulated in hallway x2 assist using rolling walker.  Pt took 1 standing rest break and 2 sitting rest breaks.  Pt c/o leg fatigue and dyspnea.  Chest tube in place.  Pt encouraged in Pursed lip breathing.  Pt encouraged in IS use.  Pt instructed need to ambulate at least 2 more times today.  Pt education in progressive activity at home completed.  Pt urinated dark amber urine.  Pt states he has no appetite and not thirsty. Pt encouraged to increase PO intake.  Water given, soup ordered for dinner.  Understanding verbalized   Dan Europe

## 2013-08-08 NOTE — Progress Notes (Signed)
Pt ambulated 350 feet with rolling walker and RN; pt with steady gait; no rest breaks needed; pt back to room to chair; call bell w/i reach; feet elevated; will cont. To monitor.

## 2013-08-08 NOTE — Progress Notes (Signed)
Pt sleeping in chair; wife in room; will attempt to ambulate later this afternoon.

## 2013-08-08 NOTE — Progress Notes (Signed)
Pt not wanting to ambulate at this time; will attempt later this AM; pt encouraged to ambulate X3 today; pt up in chair at this time; will cont. To monitor.

## 2013-08-08 NOTE — Progress Notes (Addendum)
       [address]       [place] [address]             [phone]          9 Days Post-Op Procedure(s) (LRB): CORONARY ARTERY BYPASS GRAFTING (CABG) times two on pump using left internal mammary artery and left greater saphenous vein via endovein harvest. (N/A)  Subjective: Had a rough night, a lot of weakness and pain in chest after talc pleurodesis.  Breathing stable. Feels better this am.     Objective: Vital signs in last 24 hours: Patient Vitals for the past 24 hrs:  BP Temp Temp src Pulse Resp SpO2 Weight  08/08/13 0446 106/67 mmHg 99.3 F (37.4 C) Oral 94 19 87 % 132 lb 9.6 oz (60.147 kg)  08/07/13 2112 106/63 mmHg 98.4 F (36.9 C) Oral 86 18 95 % -  08/07/13 1400 97/63 mmHg 97.9 F (36.6 C) Oral 77 18 96 % -  08/07/13 0933 - - - 93 - - -   Current Weight  08/08/13 132 lb 9.6 oz (60.147 kg)     Intake/Output from previous day: 08/29 0701 - 08/30 0700 In: 360 [P.O.:360] Out: 350 [Chest Tube:350]    PHYSICAL EXAM:  Heart: RRR Lungs: Slightly decreased BS In bases, overall clear Wound: Clean and dry Extremities: No edema    Lab Results: CBC: Recent Labs  08/06/13 0400  WBC 8.2  HGB 10.4*  HCT 30.9*  PLT 340   BMET:  Recent Labs  08/06/13 0400 08/08/13 0500  NA 136  --   K 3.7  --   CL 98  --   CO2 28  --   GLUCOSE 109*  --   BUN 28*  --   CREATININE 0.93 0.80  CALCIUM 8.8  --     PT/INR: No results found for this basename: LABPROT, INR,  in the last 72 hours    Assessment/Plan: S/P Procedure(s) (LRB): CORONARY ARTERY BYPASS GRAFTING (CABG) times two on pump using left internal mammary artery and left greater saphenous vein via endovein harvest. (N/A) CV- SBPs remain low normal, will continue Amio and hold off on starting beta blocker for now.  Appreciate cardiology input on CHF management. Will continue low dose diuretic. S/p talc pleurodesis- CT output around 50 ml out overnight, only about 100 noted since I  saw him yesterday.  Continue CT for now, hopefully if output remains low, we can d/c soon. Am labs pending.  Will follow. Continue current care. Home once CT out.     LOS: 9 days    COLLINS,GINA H 08/08/2013  I have seen and examined the patient and agree with the assessment and plan as outlined.  OWEN,CLARENCE H 08/08/2013 12:28 PM

## 2013-08-09 NOTE — Progress Notes (Signed)
Pt ambulated 200 feet with rolling walker; pt took 1 rest break which required him to sit down; pt c/o weakness in both legs; pt states legs feel like "jelly"; pt requested to be pushed back to room in wheelchair; pt back in room assisted to chair; legs elevated; call bell w/i reach; will cont. To monitor.

## 2013-08-09 NOTE — Progress Notes (Signed)
Pt ambulated 75 feet pushing wheelchair; pt took one rest break and had to sit down in wheelchair; pt stated he could not ambulate back to his room; pt pushed in wheelchair back to room; pt assisted to chair; call bell w/i reach; will cont. To monitor.

## 2013-08-09 NOTE — Progress Notes (Signed)
Chest tube removed at this time; occlusive dressing applied to site; pt tolerated well; chest xray to be done in AM; will cont. To monitor.

## 2013-08-09 NOTE — Discharge Summary (Signed)
[address]       [place] [address]             [phone]              Discharge Summary  Name: Glen Green DOB: Aug 19, 1941 72 y.o. MRN: [id number]   Admission Date: 07/30/2013 Discharge Date:     Admitting Diagnosis: Chest pain   Discharge Diagnosis:  Severe multivessel coronary artery disease Severe left ventricular dysfunction Acute non-ST elevation myocardial infarction Cardiogenic shock Congestive heart failure Right pleural effusion Expected postoperative blood loss anemia   Past Medical History  Diagnosis Date  . Dyslipidemia   . History of esophageal cancer     s/p transhiatal esophagogastrectomy  . Hypothyroidism   . Cerebral aneurysm     [place]. repair  1994  . History of lung cancer   . Pneumonia   . Cancer     Esophageal, adrenal gland, skin; lung  . Stroke 1995    denies residual  . 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 SBO (small bowel obstruction) 08/15/2012    History of small bowel obstruction (status post small bowel       resection, lysis of adhesions, incidental appendectomy, and repair       of left diaphragmatic hernia  . AAA (abdominal aortic aneurysm) 08/22/2012    3.9 cm by CT  - June 2013, stable        Procedures: EMERGENCY CORONARY ARTERY BYPASS GRAFTING x 2 (Left internal mammary artery to left anterior descending, saphenous vein graft to distal left circumflex) ENDOSCOPIC VEIN HARVEST LEFT LEG - 07/31/2013   HPI:  The patient is a 72 y.o. male who was in his usual state of health until the day prior to admission, when he developed worsening dyspnea and generalized weakness while playing golf. He quit after 14 holes and went home. He continued to feel poorly with development of nausea and vomiting and could not lie flat without coughing. He presented to the ER for further evaluation and troponin was elevated at 17. EKG showed incomplete LBBB, no other acute changes. He was seen by  cardiology and was taken emergently to the cath lab for cardiac catheterization. Cath showed severe multivessel disease with occluded LAD after septal with faint L to L collaterals filling the distal vessel to the apex. The LCX is a dominant vessel and is occluded proximally with L to L collaterals. The Ramus is moderate in size and has no significant disease. The RCA is small and nondominant with 90% proximal disease. LVEF is 20% with moderate MR. There is pulmonary HTN with PAP 53/29. An intra-aortic ballon pump was inserted and a cardiac surgery consult was obtained for consideration of emergency CABG.  Dr. Laneta Simmers saw the patient and reviewed his films, and agreed with the need for urgent revascularization.   Of note, the patient was recently seen by Dr. Tyrone Sage for workup of a right pleural effusion with some hypermetabolic uptake in the right pleura on PET scan. He had thoracentesis with fluid negative for malignancy. He has had prior left upper lobectomy for lung cancer in 2011 and prior metastatic esophageal cancer treated with chemo/radiation followed by resection and also had a right adrenalectomy in 2005 for a metastasis from the esophageal cancer. Recent PET showed no other areas of hypermetabolic activity except the right pleura which was minimal.   His operative risk was felt to be very high and  it was unclear whether revascularization would improve his ventricular function and CHF. Dr. Laneta Simmers discussed the options of emergent CABG and palliative medical therapy with the patient and his family. He agreed to proceed with surgery, and was told that his operative mortality was about 50%.  All risks, benefits and alternatives of surgery were explained in detail, and the patient agreed to proceed.    Hospital Course:  The patient was admitted to [facility] on 07/30/2013. The patient was taken to the operating room and underwent the above procedure.    The patient tolerated the procedure well, and  was taken to the SICU for furhter management.  He was weaned and extubated on postop day 1.  Hemodynamics remained stable, and the balloon pump was removed the following day. He did initially require pressors for hypotension, but these were weaned and discontinued. He required a transfusion of packed red blood cells for postop anemia.  He was able to be transferred to the stepdown unit on postop day 6.    His main postoperative issue has been persistent drainage from his right chest tube, thought to be related to CHF.  The tube was left in place for a prolonged period for conservative management, but he continued to have high output.  Ultimately, he required talc pleurodesis at the bedside on 08/07/2013.  Since then, the drainage resolved, and his chest tube was removed on 08/09/2013.    From a cardiac standpoint, he has remained generally stable.  He developed atrial fibrillation, but converted to sinus rhythm on Amiodarone.  He has had consistently low systolic blood pressures, which have been asymptomatic.  Because of this, he has not been started on a beta blocker or ACE-I.  He was seen by cardiology postop, and his medications were reviewed.  He will need traditional heart failure management, and they will arrange outpatient follow up for initiation of medications.   He is tolerating a regular diet and is ambulating in the halls without difficulty.  He has remained afebrile, and vital signs are stable.  He was treated with a 7 day course of IV antibiotics for presumed bronchitis with productive cough, which has improved.      Recent vital signs:  Filed Vitals:   08/09/13 0420  BP: 97/61  Pulse: 84  Temp: 99.1 F (37.3 C)  Resp: 20    Recent laboratory studies:  CBC:No results found for this basename: WBC, HGB, HCT, PLT,  in the last 72 hours BMET:  Recent Labs  08/08/13 0500  CREATININE 0.80    PT/INR: No results found for this basename: LABPROT, INR,  in the last 72  hours   Discharge Medications:    The patient has been discharged on:   1.Beta Blocker:  Yes [   ]                              No   [ x  ]                              If No, reason:labile blood pressure, bradycardia  2.Ace Inhibitor/ARB: Yes [   ]                                     No  [ x   ]  If No, reason: labile blood pressure  3.Statin:   Yes [x   ]                  No  [   ]                  If No, reason:  4.Ecasa:  Yes  [ x  ]                  No   [   ]                  If No, reason:     Medication List         amiodarone 200 MG tablet  Commonly known as:  PACERONE  Take 1 tablet (200 mg total) by mouth 2 (two) times daily.     aspirin 325 MG EC tablet  Take 1 tablet (325 mg total) by mouth daily.     atorvastatin 80 MG tablet  Commonly known as:  LIPITOR  Take 1 tablet (80 mg total) by mouth daily at 6 PM.     furosemide 20 MG tablet  Commonly known as:  LASIX  Take 1 tablet (20 mg total) by mouth daily.     guaiFENesin 600 MG 12 hr tablet  Commonly known as:  MUCINEX  Take 1,200 mg by mouth 2 (two) times daily as needed for congestion.     potassium chloride 10 MEQ tablet  Commonly known as:  K-DUR  Take 1 tablet (10 mEq total) by mouth 2 (two) times daily.     traMADol 50 MG tablet  Commonly known as:  ULTRAM  Take 1 tablet (50 mg total) by mouth every 6 (six) hours as needed.        Discharge Instructions:  The patient is to refrain from driving, heavy lifting or strenuous activity.  May shower daily and clean incisions with soap and water.  May resume regular diet.   Follow Up:      Discharge Orders   Future Orders Complete By Expires   Amb Referral to Cardiac Rehabilitation  As directed       Follow-up Information   Follow up with Alleen Borne, MD In 3 weeks. (Office will contact you with an appointment)    Specialty:  Cardiothoracic Surgery   Contact information:   [address] [place] [address] [phone]       Follow up with Peter Swaziland, MD. Schedule an appointment as soon as possible for a visit in 2 weeks.   Specialty:  Cardiology   Contact information:   [address] [place] [address] [phone]        COLLINS,GINA H 08/09/2013, 9:25 AM

## 2013-08-09 NOTE — Progress Notes (Addendum)
       [address]       [place] [address]             [phone]          10 Days Post-Op Procedure(s) (LRB): CORONARY ARTERY BYPASS GRAFTING (CABG) times two on pump using left internal mammary artery and left greater saphenous vein via endovein harvest. (N/A)  Subjective: Feels better today, no complaints.     Objective: Vital signs in last 24 hours: Patient Vitals for the past 24 hrs:  BP Temp Temp src Pulse Resp SpO2 Weight  08/09/13 0601 - - - - - - 131 lb 11.2 oz (59.739 kg)  08/09/13 0420 97/61 mmHg 99.1 F (37.3 C) Oral 84 20 91 % -  08/08/13 1952 96/62 mmHg 97.8 F (36.6 C) Oral 80 19 94 % -  08/08/13 1402 99/62 mmHg 97.5 F (36.4 C) Oral 82 19 98 % -  08/08/13 0950 101/55 mmHg - - 83 18 98 % -   Current Weight  08/09/13 131 lb 11.2 oz (59.739 kg)     Intake/Output from previous day: 08/30 0701 - 08/31 0700 In: 610 [P.O.:610] Out: 0     PHYSICAL EXAM:  Heart: RRR Lungs: Slightly decreased BS in R base, otherwise clear Wound:Clean and dry Extremities: No edema   Lab Results: CBC:No results found for this basename: WBC, HGB, HCT, PLT,  in the last 72 hours BMET:  Recent Labs  08/08/13 0500  CREATININE 0.80    PT/INR: No results found for this basename: LABPROT, INR,  in the last 72 hours    Assessment/Plan: S/P Procedure(s) (LRB): CORONARY ARTERY BYPASS GRAFTING (CABG) times two on pump using left internal mammary artery and left greater saphenous vein via endovein harvest. (N/A) CV- stable, still with low SBPs.  Continue Amio, will likely need to start beta blocker as outpatient. CT output low- none documented in Epic over past 24 hours, but there is about 50 ml in the Pleurovac since I marked it yesterday. Hopefully can d/c CT today. Hopefully home 1-2 days if CT out and he is otherwise doing well.   LOS: 10 days    COLLINS,GINA H 08/09/2013  I have seen and examined the patient and agree with the assessment and  plan as outlined.  D/C chest tube.  Deovion Batrez H 08/09/2013 12:06 PM

## 2013-08-09 NOTE — Progress Notes (Signed)
Pt expressed discouragement.  He does not want to walk anymore today and is down about how weak he felt today.  Emotional support and encouragement provided.

## 2013-08-09 NOTE — Progress Notes (Signed)
Pt not wanting to ambulate at this time; pt states he would like to wait until at least 1600 before he ambulates again; will cont. To monitor.

## 2013-08-09 NOTE — Progress Notes (Signed)
With encouragement pt ambulated 50 feet prior to going to bed.  He stopped once for standing rest break, then back to room.  He complains of weakness in his legs.  Pt has scattered rhonchi in chest, rattling cough.  Encouraged to move sputum out with deep cough. IS reinforced.

## 2013-08-10 ENCOUNTER — Inpatient Hospital Stay (HOSPITAL_COMMUNITY): Payer: Medicare Other

## 2013-08-10 MED ORDER — SODIUM CHLORIDE 0.9 % IJ SOLN
10.0000 mL | INTRAMUSCULAR | Status: DC | PRN
  Administered 2013-08-10 – 2013-08-11 (×2): 30 mL

## 2013-08-10 MED ORDER — SODIUM CHLORIDE 0.9 % IJ SOLN: 10.0000 mL | Freq: Two times a day (BID) | INTRAMUSCULAR | Status: DC

## 2013-08-10 NOTE — Progress Notes (Signed)
Pt walked 150 ft VIA walker with RN standing by then needed a 2 minute breather. Pt walked back 150 ft to room and had no complaints of pain. Ilean Skill, Lisett Dirusso R, RN

## 2013-08-10 NOTE — Progress Notes (Signed)
Physical Therapy Treatment Patient Details Name: Glen Green MRN: [id number] DOB: Aug 28, 1941 Today's Date: 08/10/2013 Time: 7829-5621 PT Time Calculation (min): 17 min  PT Assessment / Plan / Recommendation  History of Present Illness 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   PT Comments   Pt with decreased activity tolerance and gait distance today. Pt reports legs feel like jelly since having talc prior to CT removal. Pt with strength equal bilaterally but fatigued with gait. Pt educated for HEP and continued ambulation with staff. HR 76-86 throughout with sats 95% on RA   Follow Up Recommendations        Does the patient have the potential to tolerate intense rehabilitation     Barriers to Discharge        Equipment Recommendations       Recommendations for Other Services    Frequency     Progress towards PT Goals Progress towards PT goals: Progressing toward goals  Plan Current plan remains appropriate    Precautions / Restrictions Precautions Precautions: Sternal Precaution Comments: pt able to state all precautions   Pertinent Vitals/Pain No pain    Mobility  Bed Mobility Bed Mobility: Not assessed Details for Bed Mobility Assistance: pt in recliner on arrival Transfers Sit to Stand: 6: Modified independent (Device/Increase time);From chair/3-in-1 Stand to Sit: 6: Modified independent (Device/Increase time);To chair/3-in-1 Ambulation/Gait Ambulation/Gait Assistance: 5: Supervision Ambulation Distance (Feet): 170 Feet Assistive device: Rolling walker Ambulation/Gait Assistance Details: cueing for posture and position in RW with one standing rest break Gait Pattern: Step-through pattern;Decreased stride length Gait velocity: decreased Stairs: No    Exercises General Exercises - Lower Extremity Long Arc Quad: AROM;Both;20 reps;Seated Hip ABduction/ADduction: AROM;Both;20 reps;Seated Hip Flexion/Marching: AROM;Both;20 reps;Seated Toe Raises: AROM;Both;20 reps;Seated Heel  Raises: AROM;Both;20 reps;Seated   PT Diagnosis:    PT Problem List:   PT Treatment Interventions:     PT Goals (current goals can now be found in the care plan section)    Visit Information  Last PT Received On: 08/10/13 Assistance Needed: +1 History of Present Illness: 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    Subjective Data      Cognition  Cognition Arousal/Alertness: Awake/alert Behavior During Therapy: WFL for tasks assessed/performed Overall Cognitive Status: Within Functional Limits for tasks assessed    Balance     End of Session PT - End of Session Equipment Utilized During Treatment: Gait belt Activity Tolerance: Patient tolerated treatment well Patient left: in chair;with call bell/phone within reach Nurse Communication: Mobility status   GP     Toney Sang Beth 08/10/2013, 10:02 AM Delaney Meigs, PT [phone]

## 2013-08-10 NOTE — Progress Notes (Signed)
Per cardiac monitor QTC .52- .55

## 2013-08-10 NOTE — Progress Notes (Addendum)
       [address]       [place] [address]             [phone]          11 Days Post-Op Procedure(s) (LRB): CORONARY ARTERY BYPASS GRAFTING (CABG) times two on pump using left internal mammary artery and left greater saphenous vein via endovein harvest. (N/A)  Subjective: OOB in chair, breathing "okay".  No complaints.   Objective: Vital signs in last 24 hours: Patient Vitals for the past 24 hrs:  BP Temp Temp src Pulse Resp SpO2 Weight  08/10/13 0450 103/63 mmHg 98.2 F (36.8 C) Oral 79 20 95 % 131 lb 3.2 oz (59.512 kg)  08/09/13 2011 96/56 mmHg 97.5 F (36.4 C) Oral 79 18 95 % -  08/09/13 1405 103/69 mmHg 97.8 F (36.6 C) Oral 78 18 95 % -   Current Weight  08/10/13 131 lb 3.2 oz (59.512 kg)     Intake/Output from previous day: 08/31 0701 - 09/01 0700 In: 520 [P.O.:520] Out: 0     PHYSICAL EXAM:  Heart: RRR Lungs: Diminished BS on R Wound: Clean and dry Extremities: No significant edema    CXR: Findings: Previously noted right-sided chest tube has been removed.  No definite residual right apical pneumothorax is identified.  However, there is a moderate right-sided pleural effusion. A small  to moderate left-sided pleural effusion is unchanged. Extensive  atelectasis and/or consolidation is noted throughout the lung bases  bilaterally. No evidence of pulmonary edema. Heart size is  normal. Upper mediastinal contours are within normal limits.  Atherosclerosis in the thoracic aorta. Status post median  sternotomy for CABG. Multiple surgical clips are noted over the  lower left cervical region and right upper quadrant of the abdomen.  IMPRESSION:  1. Interval removal of right-sided chest tube with accumulation of  moderate right-sided pleural effusion. Previously noted right  pneumothorax is no longer identified.  2. Small left pleural effusion.  3. Extensive bibasilar opacities compatible with worsening areas  of atelectasis  and/or consolidation.  4. Atherosclerosis.  5. Postoperative changes, as above.   Assessment/Plan: S/P Procedure(s) (LRB): CORONARY ARTERY BYPASS GRAFTING (CABG) times two on pump using left internal mammary artery and left greater saphenous vein via endovein harvest. (N/A) CV- BPs still borderline. Continue Amio.  Will continue to hold off on starting beta blocker. R effusion- increased since last CXR on 8/28. Pt currently asymptomatic.  Will follow CXR.  May need to consider thoracentesis if it persists or enlarges.  With his h/o CHF and R effusion prior to surgery, wonder if he would be a candidate for a Pleurx catheter? Will hold discharge today and continue to monitor.   LOS: 11 days    COLLINS,GINA H 08/10/2013  I have seen and examined the patient and agree with the assessment and plan as outlined.  I don't think Mr Bless should go home today, but some loculated pleural fluid should be expected after talc pleurodesis.  Volume of effusion on today's CXR is not very large - probably no roll for Pleur-X catheter at this point but will need to monitor for signs the effusion is enlarging.  Kristofer Schaffert H 08/10/2013 10:48 AM

## 2013-08-10 NOTE — Progress Notes (Signed)
He is complaining of leg weakness. Breathing is still not normal. No arrhythmias. He there left lower sternal murmur or rub is audible. No specific cardiac recommendations.

## 2013-08-11 ENCOUNTER — Inpatient Hospital Stay (HOSPITAL_COMMUNITY): Payer: Medicare Other

## 2013-08-11 MED ORDER — POTASSIUM CHLORIDE ER 10 MEQ PO TBCR: 10.0000 meq | EXTENDED_RELEASE_TABLET | Freq: Two times a day (BID) | ORAL | Status: DC

## 2013-08-11 MED ORDER — FUROSEMIDE 20 MG PO TABS: 20.0000 mg | ORAL_TABLET | Freq: Every day | ORAL | Status: DC

## 2013-08-11 MED ORDER — ATORVASTATIN CALCIUM 80 MG PO TABS: 80.0000 mg | ORAL_TABLET | Freq: Every day | ORAL | Status: DC

## 2013-08-11 MED ORDER — TRAMADOL HCL 50 MG PO TABS: 50.0000 mg | ORAL_TABLET | Freq: Four times a day (QID) | ORAL | Status: DC | PRN

## 2013-08-11 MED ORDER — AMIODARONE HCL 200 MG PO TABS: 200.0000 mg | ORAL_TABLET | Freq: Two times a day (BID) | ORAL | Status: DC

## 2013-08-11 MED ORDER — ASPIRIN 325 MG PO TBEC: 325.0000 mg | DELAYED_RELEASE_TABLET | Freq: Every day | ORAL | Status: DC

## 2013-08-11 NOTE — Progress Notes (Signed)
      [address]       [place] [address]             [phone]      12 Days Post-Op Procedure(s) (LRB): CORONARY ARTERY BYPASS GRAFTING (CABG) times two on pump using left internal mammary artery and left greater saphenous vein via endovein harvest. (N/A)  Subjective:  Mr. Brigante has no complaints this morning.  He denies shortness of breath at rest.  He does get short winded when he is ambulating, but patient states this has been improving.    Objective: Vital signs in last 24 hours: Temp:  [97.7 F (36.5 C)-97.9 F (36.6 C)] 97.8 F (36.6 C) (09/02 0502) Pulse Rate:  [72-77] 77 (09/02 0502) Cardiac Rhythm:  [-] Normal sinus rhythm (09/01 1935) Resp:  [18] 18 (09/02 0502) BP: (92-102)/(50-60) 102/60 mmHg (09/02 0502) SpO2:  [93 %-97 %] 93 % (09/02 0502) Weight:  [130 lb 15.3 oz (59.4 kg)] 130 lb 15.3 oz (59.4 kg) (09/02 0502)  Intake/Output from previous day: 09/01 0701 - 09/02 0700 In: 600 [P.O.:600] Out: -   General appearance: alert, cooperative and no distress Heart: regular rate and rhythm Lungs: diminished breath sounds RLL Abdomen: soft, non-tender; bowel sounds normal; no masses,  no organomegaly Extremities: extremities normal, atraumatic, no cyanosis or edema Wound: clean and dry  Lab Results: No results found for this basename: WBC, HGB, HCT, PLT,  in the last 72 hours BMET: No results found for this basename: NA, K, CL, CO2, GLUCOSE, BUN, CREATININE, CALCIUM,  in the last 72 hours  PT/INR: No results found for this basename: LABPROT, INR,  in the last 72 hours ABG    Component Value Date/Time   PHART 7.336* 07/31/2013 1003   HCO3 22.6 07/31/2013 1003   TCO2 24 07/31/2013 1709   ACIDBASEDEF 3.0* 07/31/2013 1003   O2SAT 88.0 07/31/2013 1003   CBG (last 3)  No results found for this basename: GLUCAP,  in the last 72 hours  Assessment/Plan: S/P Procedure(s) (LRB): CORONARY ARTERY BYPASS GRAFTING (CABG) times two on pump using left  internal mammary artery and left greater saphenous vein via endovein harvest. (N/A)  1. CV- NSR, blood pressure remains on the low side running 90s-100s 2. Pulm- no acute issues, off oxygen, right sided pleural effusion worsened from 8/28, stable on today's film 3. Dispo- patient remains medically stable, right sided pleural effusion remains stable, ? Discharge plans   LOS: 12 days    Lowella Dandy 08/11/2013

## 2013-08-11 NOTE — Progress Notes (Signed)
CARDIAC REHAB PHASE I   Ed completed with pt and wife. Voiced understanding. [phone]  Elissa Lovett Kapowsin CES, ACSM 08/11/2013 10:59 AM

## 2013-08-11 NOTE — Progress Notes (Signed)
Nursing Note Removed CT sutures per MD order per hospital policy. Applied benzoin and steri strips. Patient tolerated well. Removed central line in left groin. Applied Vaseline guaze and gauze to site and held pressure for 5 minutes. Patient tolerated well. Will continue to monitor. Patient to lay in bed for 20-30 minutes. Discussed discharge summary with patient and went over medications and fu appts. Lajuana Matte, RN

## 2013-08-11 NOTE — Progress Notes (Signed)
Subjective:  Much better in past 24 hours. Less SOB. Walking improved. No pain.   Objective:  Vital Signs in the last 24 hours: Temp:  [97.7 F (36.5 C)-97.9 F (36.6 C)] 97.8 F (36.6 C) (09/02 0502) Pulse Rate:  [72-77] 77 (09/02 0502) Resp:  [18] 18 (09/02 0502) BP: (92-102)/(50-60) 102/60 mmHg (09/02 0502) SpO2:  [93 %-97 %] 93 % (09/02 0502) Weight:  [59.4 kg (130 lb 15.3 oz)] 59.4 kg (130 lb 15.3 oz) (09/02 0502)  Intake/Output from previous day: 09/01 0701 - 09/02 0700 In: 600 [P.O.:600] Out: -    Physical Exam: General: Well developed, well nourished, in no acute distress. Head:  Normocephalic and atraumatic. Lungs: Distant BS B. Decreased RLL Heart: Normal S1 and S2.  No murmur, rubs or gallops. Well healing scar.  Abdomen: soft, non-tender, positive bowel sounds. Extremities: No clubbing or cyanosis. No edema. Neurologic: Alert and oriented x 3.  Imaging: Dg Chest 2 View  08/11/2013   *RADIOLOGY REPORT*  Clinical Data: Right pleural effusion, recent thoracentesis  CHEST - 2 VIEW  Comparison: 08/10/2013  Findings: Possible tiny right apical pneumothorax, equivocal.  Associated moderate basilar pleural fluid component with additional fluid along the right major fissure.  Small left pleural effusion. These are unchanged.  No frank interstitial edema.  Bilateral lower lobe opacities, likely atelectasis.  The heart is normal in size. Postsurgical changes related to prior CABG.  Surgical clips in the left paratracheal region.  Degenerative changes of the visualized thoracolumbar spine.  IMPRESSION: Possible tiny right apical pneumothorax, equivocal.  Associated moderate right basilar pleural fluid component and a small left pleural effusion, unchanged.   Original Report Authenticated By: Charline Bills, M.D.   Dg Chest 2 View  08/10/2013   *RADIOLOGY REPORT*  Clinical Data: Shortness of breath.  CHEST - 2 VIEW  Comparison: Chest x-ray 08/06/2013.  Findings: Previously noted  right-sided chest tube has been removed. No definite residual right apical pneumothorax is identified. However, there is a moderate right-sided pleural effusion.  A small to moderate left-sided pleural effusion is unchanged.  Extensive atelectasis and/or consolidation is noted throughout the lung bases bilaterally.  No evidence of pulmonary edema.  Heart size is normal.  Upper mediastinal contours are within normal limits. Atherosclerosis in the thoracic aorta.  Status post median sternotomy for CABG.  Multiple surgical clips are noted over the lower left cervical region and right upper quadrant of the abdomen.  IMPRESSION: 1.  Interval removal of right-sided chest tube with accumulation of moderate right-sided pleural effusion.  Previously noted right pneumothorax is no longer identified. 2.  Small left pleural effusion. 3.  Extensive bibasilar opacities compatible with worsening areas of atelectasis and/or consolidation. 4.  Atherosclerosis. 5.  Postoperative changes, as above.   Original Report Authenticated By: Trudie Reed, M.D.      Telemetry: NSR Personally viewed.    Cardiac Studies:  EF 25%  Assessment/Plan:  Principal Problem:   NSTEMI (non-ST elevated myocardial infarction) Active Problems:   Dyslipidemia   Hypothyroidism   History of lung cancer   Weakness generalized   PVD (peripheral vascular disease)   Acute systolic heart failure  1) Ischemic cardiomyopathy/ systolic HF  - appears better today  - BP to low to add ACE or Bb  - On amio to control rhythm  - ASA  - will likely need chronic lasix (off for now)  - hopefully improve EF post CABG. Will need to reassess in future.   2) NSTEMI  - prior  to CABG  - secondary prevention  3) s/p talc to right CT     SKAINS, MARK 08/11/2013, 8:37 AM

## 2013-08-17 ENCOUNTER — Other Ambulatory Visit: Payer: Self-pay | Admitting: *Deleted

## 2013-08-17 DIAGNOSIS — I2510 Atherosclerotic heart disease of native coronary artery without angina pectoris: Secondary | ICD-10-CM

## 2013-08-18 ENCOUNTER — Ambulatory Visit (INDEPENDENT_AMBULATORY_CARE_PROVIDER_SITE_OTHER): Payer: Self-pay

## 2013-08-18 DIAGNOSIS — Z4802 Encounter for removal of sutures: Secondary | ICD-10-CM

## 2013-08-18 DIAGNOSIS — I2510 Atherosclerotic heart disease of native coronary artery without angina pectoris: Secondary | ICD-10-CM

## 2013-08-18 NOTE — Progress Notes (Signed)
Pt was scheduled for suture removal at chest tube sites. After I examined all surgical sites, found no sutures present. Only steri strips on 3 chest tube sites.  No signs of infection. Pt states the nurse in the hospital removed sutures before discharging home.   He is scheduled to see Dr. Laneta Simmers in the [facility] with CXR prior.

## 2013-08-19 ENCOUNTER — Ambulatory Visit
Admission: RE | Admit: 2013-08-19 | Discharge: 2013-08-19 | Disposition: A | Discharge status: Home / Self Care | Payer: Medicare Other | Source: Ambulatory Visit | Attending: Surgery | Admitting: Surgery

## 2013-08-19 ENCOUNTER — Other Ambulatory Visit: Payer: Self-pay | Admitting: *Deleted

## 2013-08-19 ENCOUNTER — Ambulatory Visit (INDEPENDENT_AMBULATORY_CARE_PROVIDER_SITE_OTHER): Payer: Self-pay | Admitting: Surgery

## 2013-08-19 ENCOUNTER — Encounter: Payer: Self-pay | Admitting: Surgery

## 2013-08-19 VITALS — BP 94/63 | HR 74 | Resp 20 | Ht 70.0 in | Wt 120.5 lb

## 2013-08-19 DIAGNOSIS — I2510 Atherosclerotic heart disease of native coronary artery without angina pectoris: Secondary | ICD-10-CM

## 2013-08-19 DIAGNOSIS — J90 Pleural effusion, not elsewhere classified: Secondary | ICD-10-CM

## 2013-08-19 DIAGNOSIS — Z951 Presence of aortocoronary bypass graft: Secondary | ICD-10-CM

## 2013-08-20 ENCOUNTER — Encounter: Payer: Self-pay | Admitting: Surgery

## 2013-08-20 NOTE — Progress Notes (Signed)
[address]       [place] [address]             [phone]         HPI: Patient returns for routine postoperative follow-up having undergone emergent CABG x 2 on 07/31/2013. He had severe multivessel coronary artery disease with occluded LAD and LCX with severe LV dysfunction and acute MI with cardiogenic shock and CHF.  The patient's early postoperative recovery while in the hospital was notable for a slow postop course. He developed postop atrial fibrillation and was converted to sinus on amiodarone. He had persistent drainage from a right pleural chest tube and had had a right pleural effusion tapped recently with negative cytology.  Since hospital discharge the patient reports that he has been slowly improving. He still tires easily but has no chest pain or dyspnea.   Current Outpatient Prescriptions  Medication Sig Dispense Refill  . amiodarone (PACERONE) 200 MG tablet Take 1 tablet (200 mg total) by mouth 2 (two) times daily.  60 tablet  3  . aspirin EC 325 MG EC tablet Take 1 tablet (325 mg total) by mouth daily.  30 tablet  0  . atorvastatin (LIPITOR) 80 MG tablet Take 1 tablet (80 mg total) by mouth daily at 6 PM.  30 tablet  3  . furosemide (LASIX) 20 MG tablet Take 1 tablet (20 mg total) by mouth daily.  30 tablet  3  . guaiFENesin (MUCINEX) 600 MG 12 hr tablet Take 1,200 mg by mouth 2 (two) times daily as needed for congestion.      . potassium chloride (K-DUR) 10 MEQ tablet Take 1 tablet (10 mEq total) by mouth 2 (two) times daily.  60 tablet  3  . traMADol (ULTRAM) 50 MG tablet Take 1 tablet (50 mg total) by mouth every 6 (six) hours as needed.  30 tablet  0   No current facility-administered medications for this visit.    Physical Exam: BP 94/63  Pulse 74  Resp 20  Ht 5\' 10"  (1.778 m)  Wt 120 lb 8 oz (54.658 kg)  BMI 17.29 kg/m2  SpO2 92% He looks a little tired but in no distress Lung exam reveals decreased breath sounds in the  bases Cardiac exam shows a regular rate and rhythm with normal heart sounds The chest incision is healing well and the sternum is stable. There is no peripheral edema.  Diagnostic Tests:  *RADIOLOGY REPORT*   Clinical Data: Post CABG (07/30/2013) right-sided pleural effusion   CHEST - 2 VIEW   Comparison: 08/11/2013; 08/10/2013; 08/06/2013; chest CT - 07/13/2013   Findings:   Grossly unchanged cardiac silhouette post median sternotomy. The lungs remain hyperexpanded.  Improved aeration of the bilateral lung bases with persistent small partially loculated bilateral pleural effusions, right greater than left.  Small amount of fluid is again noted layering within the right minor fissure.  No new focal airspace opacities. There is incomplete aeration of the right lower lobe without definite pneumothorax.  A small amount of fluid is again noted tracking within the right minor fissure.  No evidence of edema.  Unchanged bones including postsurgical change of the posterior aspects of the right 6th and 7th ribs.  Right upper abdominal surgical clips.   IMPRESSION:   Improved aeration of the lungs with persistent small partially loculated bilateral pleural effusions and residual bibasilar atelectasis.  No definite pneumothorax or evidence of edema.     Original Report  Authenticated By: Tacey Ruiz, MD   Impression:  Overall I think he is making fairly good progress considering his preop condition. His preop EF was 25%. I think he is almost at his preop weight so I told him he can discontinue his lasix and KCL for now and follow his weight daily. It has continued to drop while on lasix. I think he may be getting a little dry. His CXR looks better with only small bilateral pleural effusions.I also decreased his amiodarone to 200 daily and would plan to discontinue that about 6 weeks postop.  Plan:  I will see him back in 1 month for followup. He has his first followup appt with  cardiology in early October. He will need to have his EF reassessed with echo.

## 2013-08-21 ENCOUNTER — Ambulatory Visit (INDEPENDENT_AMBULATORY_CARE_PROVIDER_SITE_OTHER): Payer: Self-pay | Admitting: *Deleted

## 2013-08-21 DIAGNOSIS — I2510 Atherosclerotic heart disease of native coronary artery without angina pectoris: Secondary | ICD-10-CM

## 2013-08-21 DIAGNOSIS — Z951 Presence of aortocoronary bypass graft: Secondary | ICD-10-CM

## 2013-08-21 DIAGNOSIS — Z4802 Encounter for removal of sutures: Secondary | ICD-10-CM

## 2013-08-21 NOTE — Progress Notes (Signed)
Mrs. Mittman had called with concerns that one of his chest tube sites had a remaining stitich and was draining pus. On exam, the site is normal and they were reasurred

## 2013-08-31 ENCOUNTER — Other Ambulatory Visit: Payer: Self-pay | Admitting: *Deleted

## 2013-08-31 DIAGNOSIS — I2510 Atherosclerotic heart disease of native coronary artery without angina pectoris: Secondary | ICD-10-CM

## 2013-09-02 ENCOUNTER — Encounter: Payer: Self-pay | Admitting: Surgery

## 2013-09-02 ENCOUNTER — Ambulatory Visit: Payer: Medicare Other | Admitting: Surgery

## 2013-09-02 ENCOUNTER — Ambulatory Visit
Admission: RE | Admit: 2013-09-02 | Discharge: 2013-09-02 | Disposition: A | Discharge status: Home / Self Care | Payer: Medicare Other | Source: Ambulatory Visit | Attending: Surgery | Admitting: Surgery

## 2013-09-02 ENCOUNTER — Ambulatory Visit (INDEPENDENT_AMBULATORY_CARE_PROVIDER_SITE_OTHER): Payer: Self-pay | Admitting: Surgery

## 2013-09-02 VITALS — BP 110/74 | HR 76 | Resp 16 | Ht 70.0 in | Wt 122.0 lb

## 2013-09-02 DIAGNOSIS — I2510 Atherosclerotic heart disease of native coronary artery without angina pectoris: Secondary | ICD-10-CM

## 2013-09-02 DIAGNOSIS — Z951 Presence of aortocoronary bypass graft: Secondary | ICD-10-CM

## 2013-09-02 NOTE — Progress Notes (Signed)
[address]       [place] [address]             [phone]        HPI:  Patient returns for routine postoperative follow-up having undergone emergent CABG x 2 on 07/31/2013. He had severe multivessel coronary artery disease with occluded LAD and LCX with severe LV dysfunction and acute MI with cardiogenic shock and CHF.  The patient's early postoperative recovery while in the hospital was notable for a slow postop course. He developed postop atrial fibrillation and was converted to sinus on amiodarone. He had persistent drainage from a right pleural chest tube and had had a right pleural effusion tapped recently with negative cytology. When I last saw him on 08/20/2013 he was making good progress and had diuresed to his preop weight so his diuretic was stopped. He said that he has been maintaining his weight and does notice some swelling in his legs at the end of the day but that resolved by the next morning. His wife said that he really likes a lot of salt on his food and she has been keeping it from him.     Current Outpatient Prescriptions  Medication Sig Dispense Refill  . amiodarone (PACERONE) 200 MG tablet Take 1 tablet (200 mg total) by mouth 2 (two) times daily.  60 tablet  3  . aspirin EC 325 MG EC tablet Take 1 tablet (325 mg total) by mouth daily.  30 tablet  0  . atorvastatin (LIPITOR) 80 MG tablet Take 1 tablet (80 mg total) by mouth daily at 6 PM.  30 tablet  3  . guaiFENesin (MUCINEX) 600 MG 12 hr tablet Take 1,200 mg by mouth 2 (two) times daily as needed for congestion.      . traMADol (ULTRAM) 50 MG tablet Take 1 tablet (50 mg total) by mouth every 6 (six) hours as needed.  30 tablet  0   No current facility-administered medications for this visit.     Physical Exam: BP 110/74  Pulse 76  Resp 16  Ht 5\' 10"  (1.778 m)  Wt 122 lb (55.339 kg)  BMI 17.51 kg/m2  SpO2 99% He looks well. Lung exam is clear. Cardiac exam shows a regular rate  and rhythm with normal heart sounds. The chest incision is healing well and sternum is stable. There is no peripheral edema.  Diagnostic Tests:  CLINICAL DATA:  Post CABG, follow-up, former smoking history   EXAM: CHEST  2 VIEW   COMPARISON:  Chest x-ray of 08/19/2013   FINDINGS: The lungs are better aerated. The small right pleural effusion has decreased in volume with a little change in the small left effusion. Mild cardiomegaly is stable. Median sternotomy sutures are intact. No bony abnormality is seen.   IMPRESSION: Improved aeration. Small effusions remain bilaterally.     Electronically Signed   By: Dwyane Dee M.D.   On: 09/02/2013 13:11     Impression:  Overall I think he is making good progress following his surgery especially considering his low ejection fraction of 25% preoperatively and surgery in the setting of acute myocardial infarction with cardiogenic shock and congestive heart failure. He seems to be maintaining sinus rhythm and should be able to get off of amiodarone in the near future. He has an appointment soon with Dr. Anne Fu and I will continue the amiodarone until he sees him. He will need an echocardiogram at some point to  reevaluate his left ventricular function. His chest x-ray continues to improve and there is minimal bilateral pleural effusion remaining. 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  Plan:  He will followup with Dr. Anne Fu and will contact me if he develops any problems with his incisions appear

## 2013-09-09 ENCOUNTER — Encounter: Payer: Self-pay | Admitting: Cardiology

## 2013-09-09 ENCOUNTER — Ambulatory Visit (INDEPENDENT_AMBULATORY_CARE_PROVIDER_SITE_OTHER): Payer: Medicare Other | Admitting: Cardiology

## 2013-09-09 ENCOUNTER — Other Ambulatory Visit: Payer: Self-pay | Admitting: Cardiology

## 2013-09-09 VITALS — BP 102/64 | HR 72 | Ht 70.0 in | Wt 123.0 lb

## 2013-09-09 DIAGNOSIS — I2510 Atherosclerotic heart disease of native coronary artery without angina pectoris: Secondary | ICD-10-CM | POA: Insufficient documentation

## 2013-09-09 DIAGNOSIS — I5021 Acute systolic (congestive) heart failure: Secondary | ICD-10-CM

## 2013-09-09 DIAGNOSIS — I4891 Unspecified atrial fibrillation: Secondary | ICD-10-CM

## 2013-09-09 MED ORDER — ATORVASTATIN CALCIUM 80 MG PO TABS: 80.0000 mg | ORAL_TABLET | Freq: Every day | ORAL | Status: DC

## 2013-09-09 NOTE — Patient Instructions (Addendum)
Your physician has recommended you make the following change in your medication:   1. Stop Amiodarone  Your physician recommends that you schedule a follow-up appointment in: 3 months with Dr. Skains   

## 2013-09-09 NOTE — Progress Notes (Signed)
Patient ID: Glen Green, male   DOB: 1941/08/22, 72 y.o.   MRN: [id number]     [address] [place], [place]  [address] Phone: [phone] Fax:  [phone]  Date:  09/09/2013   ID:  Glen Green, DOB 1941/10/12, MRN [id number]  PCP:  Lorenda Peck, MD    History of Present Illness: Glen Green is a 72 y.o. male here with coronary artery disease status post bypass surgery here for followup. He had emergency CABG x2 on 07/31/13 secondary to severe multivessel coronary artery disease with occluded LAD, occluded circumflex and severely decreased left ventricular dysfunction with acute myocardial infarction and cardiogenic shock.  Any prolonged recovery. Postop atrial fibrillation was noted then he converted to sinus rhythm on amiodarone.  Been feeling stronger everyday. No syncope, no bleeding, no CP. Some right upper shoulder/chest pain. Tylenol helps.     Wt Readings from Last 3 Encounters:  09/09/13 55.792 kg (123 lb)  09/02/13 55.339 kg (122 lb)  08/19/13 54.658 kg (120 lb 8 oz)     Past Medical History  Diagnosis Date  . Dyslipidemia   . History of esophageal cancer     s/p transhiatal esophagogastrectomy  . Hypothyroidism   . Cerebral aneurysm     [place]. repair  1994  . History of lung cancer   . Pneumonia   . Cancer     Esophageal, adrenal gland, skin; lung  . Stroke 1995    denies residual  . 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 SBO (small bowel obstruction) 08/15/2012    History of small bowel obstruction (status post small bowel       resection, lysis of adhesions, incidental appendectomy, and repair       of left diaphragmatic hernia  . AAA (abdominal aortic aneurysm) 08/22/2012    3.9 cm by CT  - June 2013, stable     Current Outpatient Prescriptions  Medication Sig Dispense Refill  . aspirin EC 325 MG EC tablet Take 1 tablet (325 mg total) by mouth daily.  30 tablet  0  . atorvastatin (LIPITOR) 80 MG tablet Take 1  tablet (80 mg total) by mouth daily at 6 PM.  30 tablet  3  . guaiFENesin (MUCINEX) 600 MG 12 hr tablet Take 1,200 mg by mouth 2 (two) times daily as needed for congestion.       No current facility-administered medications for this visit.    Allergies:   No Known Allergies  Social History:  The patient  reports that he quit smoking about 10 years ago. His smoking use included Cigarettes. He smoked 0.00 packs per day. He has never used smokeless tobacco. He reports that he does not drink alcohol or use illicit drugs.   ROS:  Please see the history of present illness.    All other systems reviewed and negative.   PHYSICAL EXAM: VS:  BP 102/64  Pulse 72  Ht 5\' 10"  (1.778 m)  Wt 55.792 kg (123 lb)  BMI 17.65 kg/m2 Thin,  in no acute distress HEENT: normal Neck: no JVD Cardiac:  normal S1, S2; RRR; no murmurBypass scar is healing well Lungs:  clear to auscultation bilaterally, no wheezing, rhonchi or rales, kyphosis noted Abd: soft, nontender, no hepatomegaly Ext: no edema Skin: warm and dry Neuro:  CNs 2-12 intact, no focal abnormalities noted  EKG:  Normal sinus rhythm, no longer A. fib  ASSESSMENT AND PLAN:  72 year old  male with coronary artery disease status post bypass emergently x2, brief postoperative atrial fibrillation, malnutrition, hyperlipidemia.  1. Coronary artery disease  - Overall doing very well, no complaints, no exertional anginal symptoms, continue with rehabilitation efforts. I'm fine with him going to the beach in late October.  2. Atrial fibrillation-postoperatively  - Currently in normal sinus rhythm  - I am going to discontinue his amiodarone. Currently with first degree AV block, normal rate. I am not utilizing beta blocker.  3. Hyperlipidemia  - Continue with statin  4. Malnutrition - Continue to increase weight. Ensure would be helpful.  Lonna Duval, MARK  09/09/2013 11:26 AM

## 2013-09-19 IMAGING — CT CT CHEST W/ CM
2 of 5 series · 12 of 36 positions shown, 19 images · IV contrast (READICAT/WATER & [ID] OMNI 300)
Comparison: 05/26/2012

***ADDENDUM*** CREATED: 07/02/2013 [DATE]

Within the right pleural effusion, there are areas of possible
nodularity.  There is an 11 mm possible pleural nodule medially on
image 46 of series 3.  This also appears partially loculated.
There is suspicion for septations more inferiorly.  I cannot
exclude pleural metastases/neoplasm.  Consider PET CT and/or right
thoracentesis for further evaluation.
***END ADDENDUM*** SIGNED BY: Harjinder Helmuth, M.D.
CLINICAL DATA: History of esophageal cancer with esophagectomy and
gastric pull-through.
CT CHEST WITH CONTRAST
TECHNIQUE: Multidetector CT imaging of the chest was performed
following the standard protocol during bolus administration of
intravenous contrast.
Contrast: 100mL OMNIPAQUE IOHEXOL 300 MG/ML  SOLN

[Series 601: coronal body · coronal · 0.95mm/px · 1 of 123 slices shown, 2 images]
[im 41/123  soft-tissue]
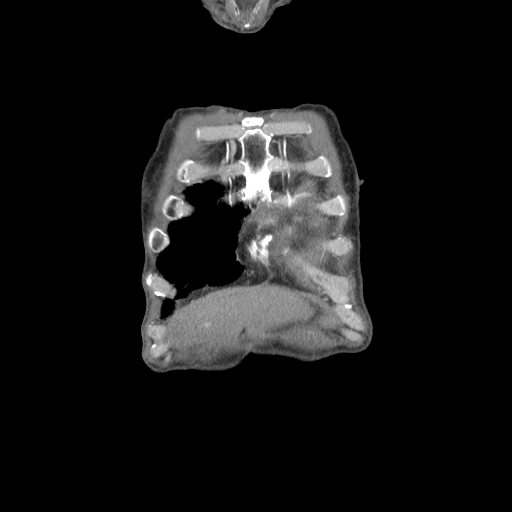
[im 41/123  bone]
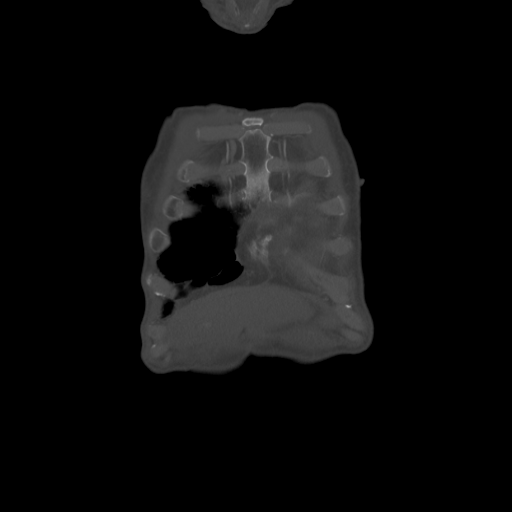

[Series 602: sagittal body · sagittal · 0.95mm/px · 11 of 162 slices shown, 17 images]
[im 13/162  soft-tissue]
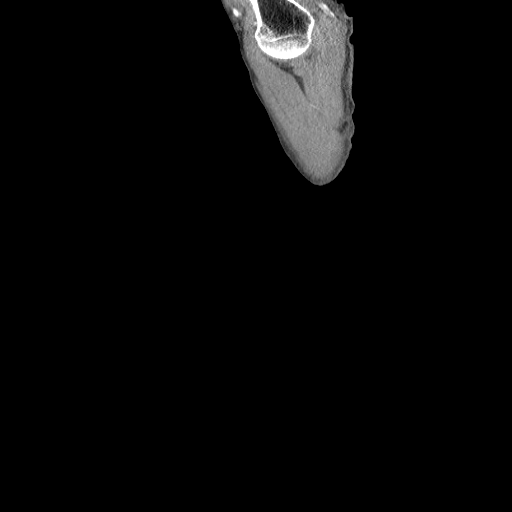
[im 13/162  lung]
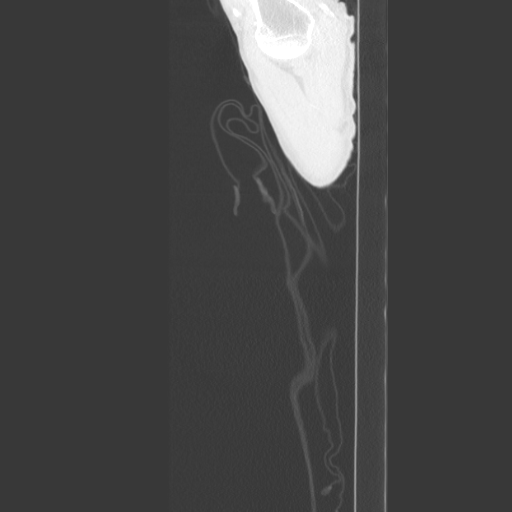
[im 13/162  bone]
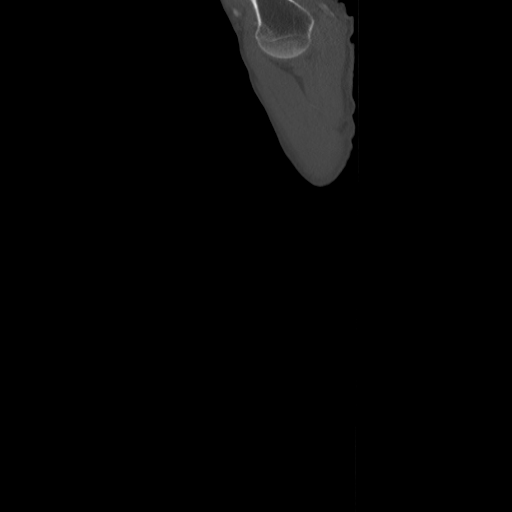
[im 25/162  soft-tissue]
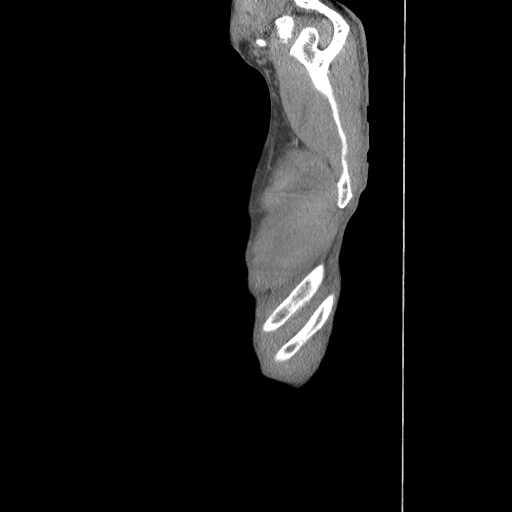
[im 25/162  lung]
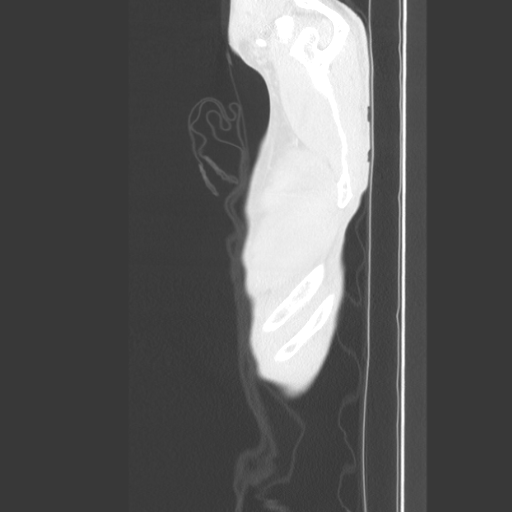
[im 38/162  soft-tissue]
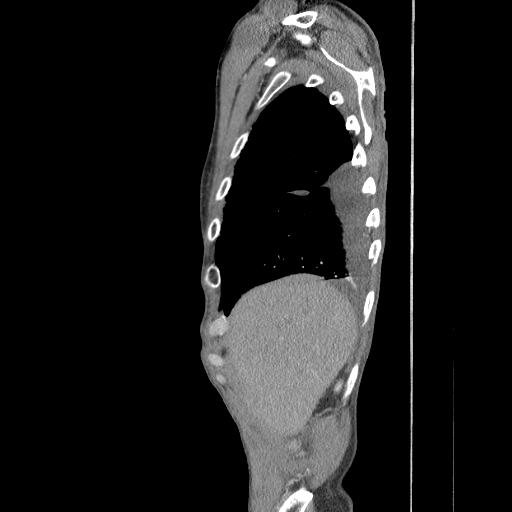
[im 38/162  lung]
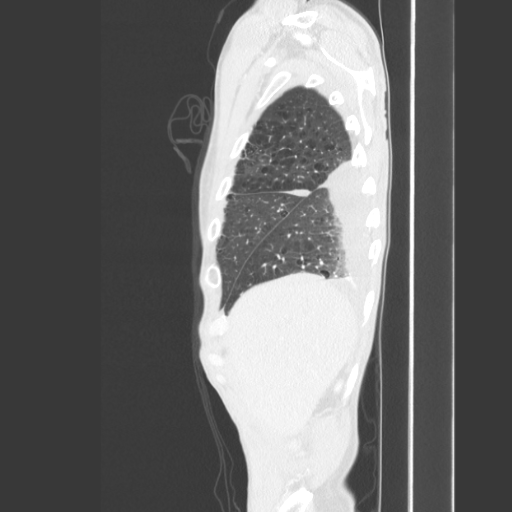
[im 50/162  soft-tissue]
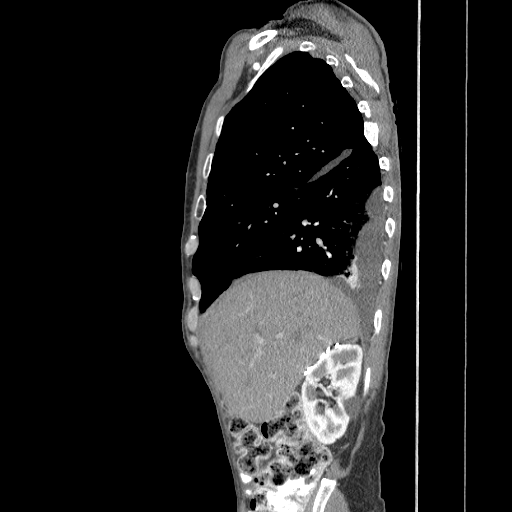
[im 50/162  lung]
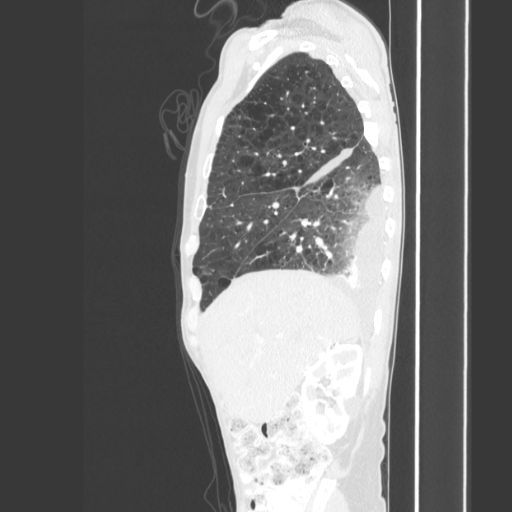
[im 62/162  soft-tissue]
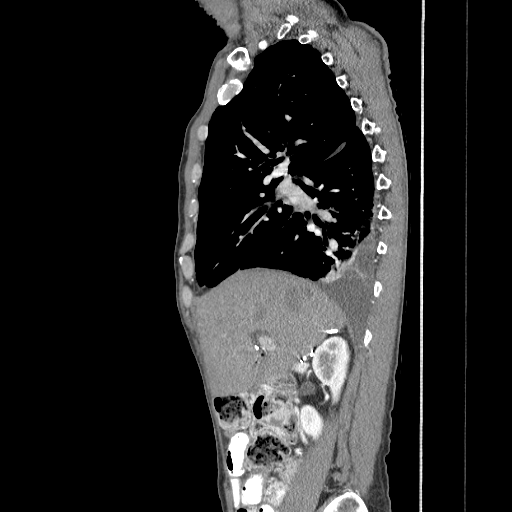
[im 87/162  soft-tissue]
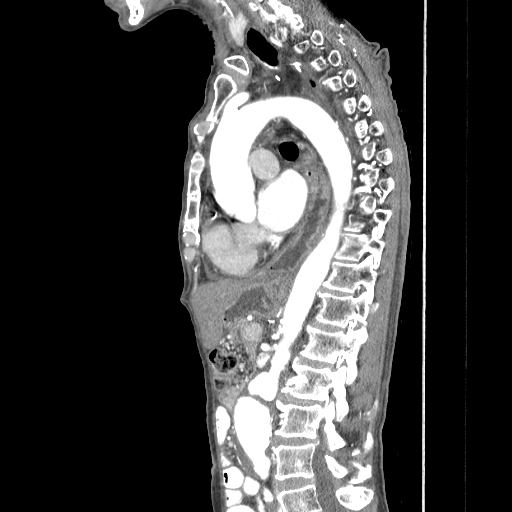
[im 100/162  soft-tissue]
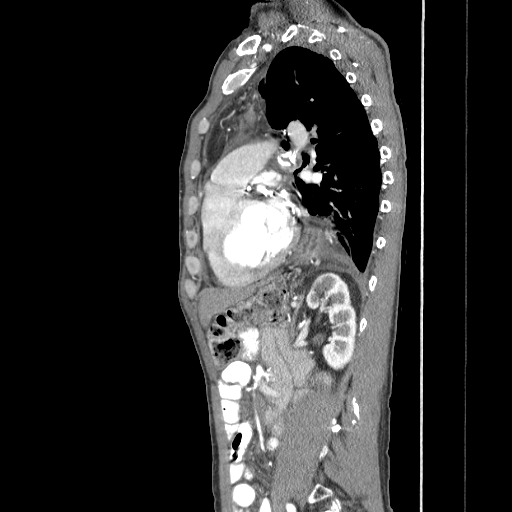
[im 112/162  soft-tissue]
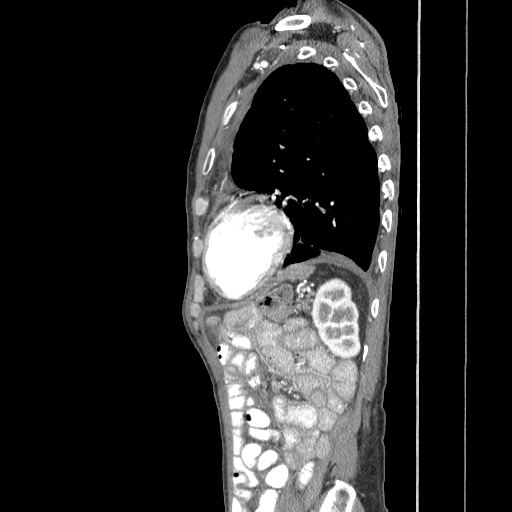
[im 124/162  soft-tissue]
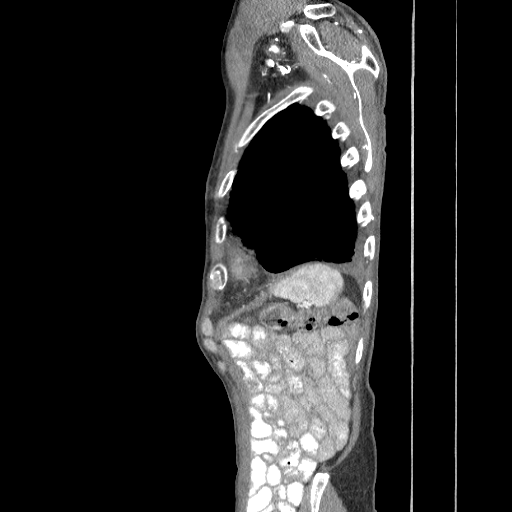
[im 124/162  bone]
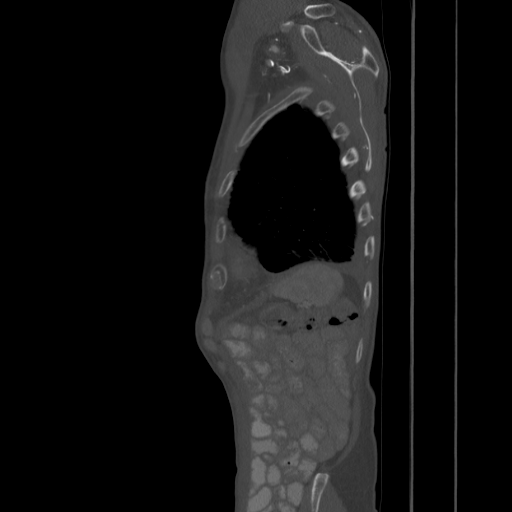
[im 137/162  soft-tissue]
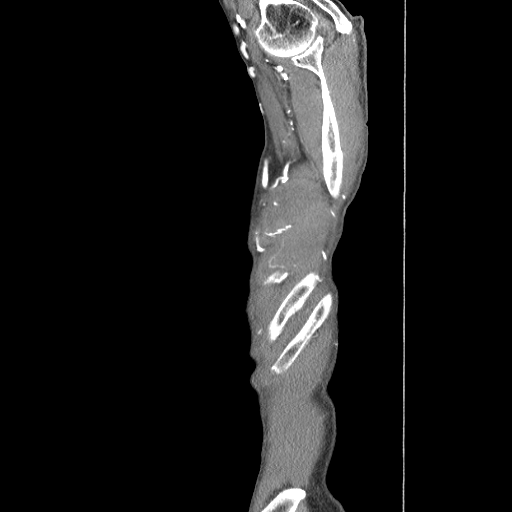
[im 149/162  soft-tissue]
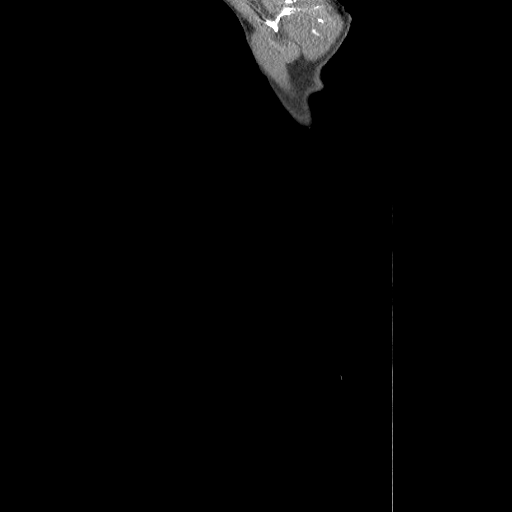

[12 of 36 positions shown; findings below may reference images not displayed]

FINDINGS: The lungs are well-aerated bilaterally with emphysematous
changes noted. A small right-sided pleural effusion is identified
with some associated right lower lobe atelectasis.  This is new
from prior exam.  No new focal infiltrate or sizable parenchymal
nodule is seen.

There are changes consistent with the patient's given clinical
history of esophagectomy and gastric pull-through.  An air and
fluid is noted within the stomach.  No obstructive changes are
seen.

The thoracic aorta and pulmonary artery are less than stable.  Mild
dilatation of the proximal ascending aorta is again seen and
stable..  Heavy coronary calcifications are again seen. No hilar or
mediastinal adenopathy is identified.

Scanning into the upper abdomen reveals postsurgical changes.
IMPRESSION: Changes consistent with the known history of esophagectomy and
gastric pull-through.  The overall appearance is stable.  No
recurrent disease is seen.

Stable dilatation of the ascending aorta.

Stable emphysematous changes.

## 2013-09-30 IMAGING — US US THORACENTESIS ASP PLEURAL SPACE W/IMG GUIDE
1 series · 7 of 7 positions shown · non-contrast
Comparison: PET scan on 07/07/2013

CLINICAL DATA: History of lung carcinoma with right pleural
effusion and possible increased metabolic activity in the right
pleural space by PET scan.

ULTRASOUND GUIDED RIGHT THORACENTESIS

[Series 1: us thoracentesis asp pleural space w/img guide · 0.28mm/px · 7 of 7 slices shown]
[im 1/7]
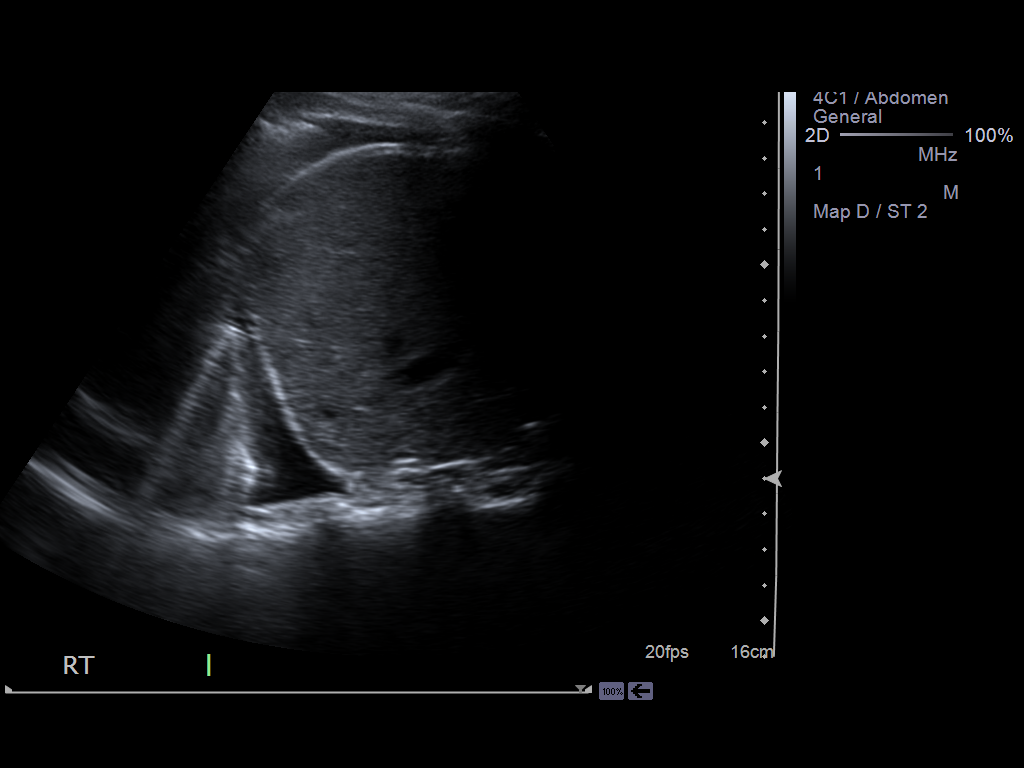
[im 2/7]
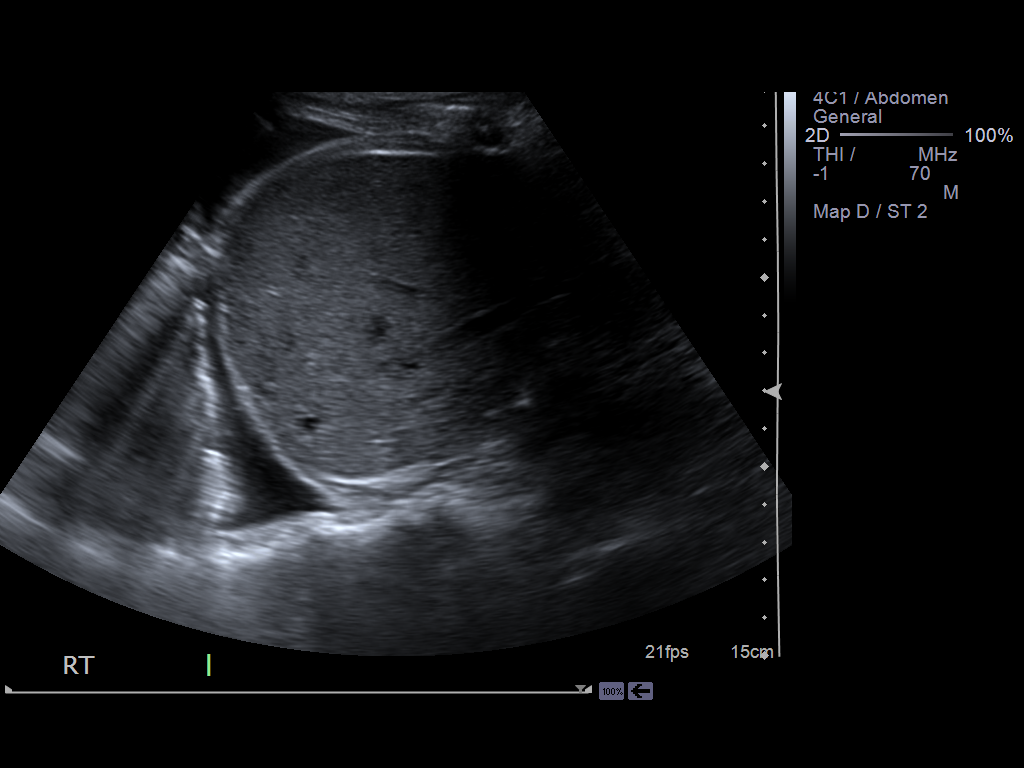
[im 3/7]
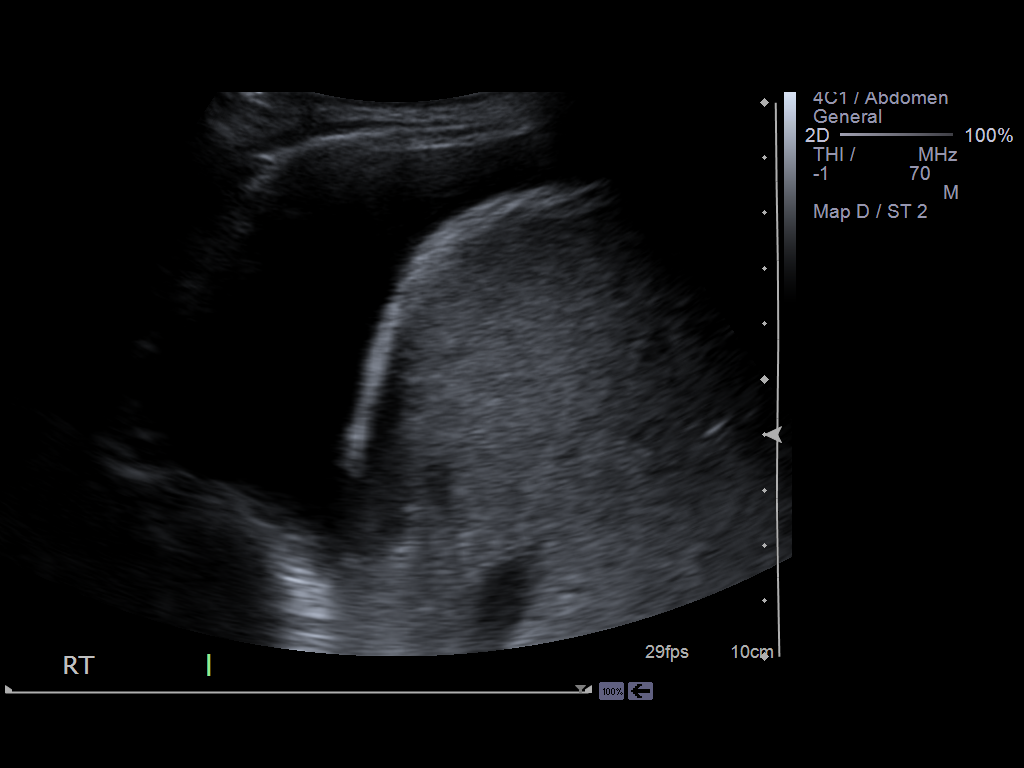
[im 4/7]
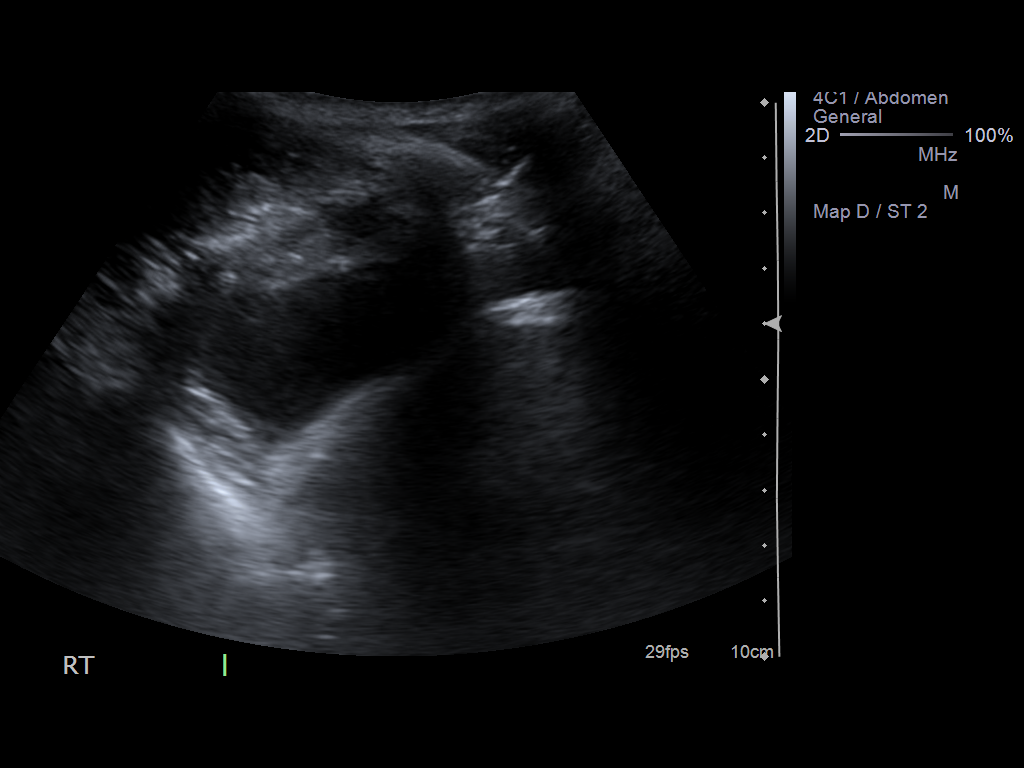
[im 5/7]
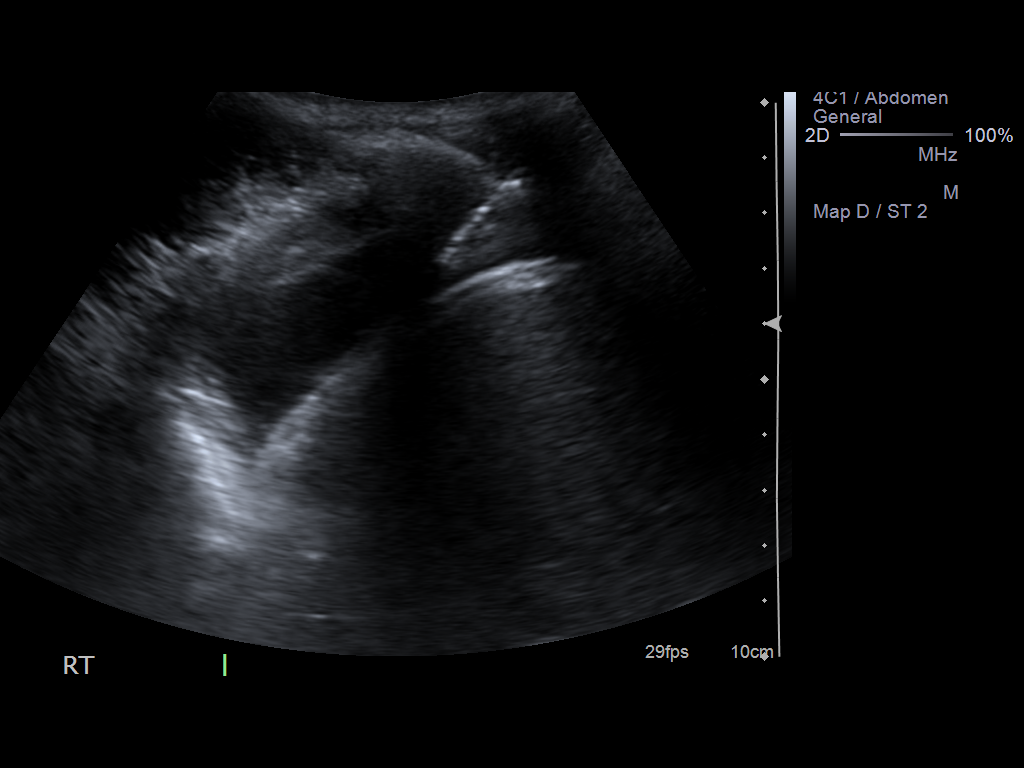
[im 6/7]
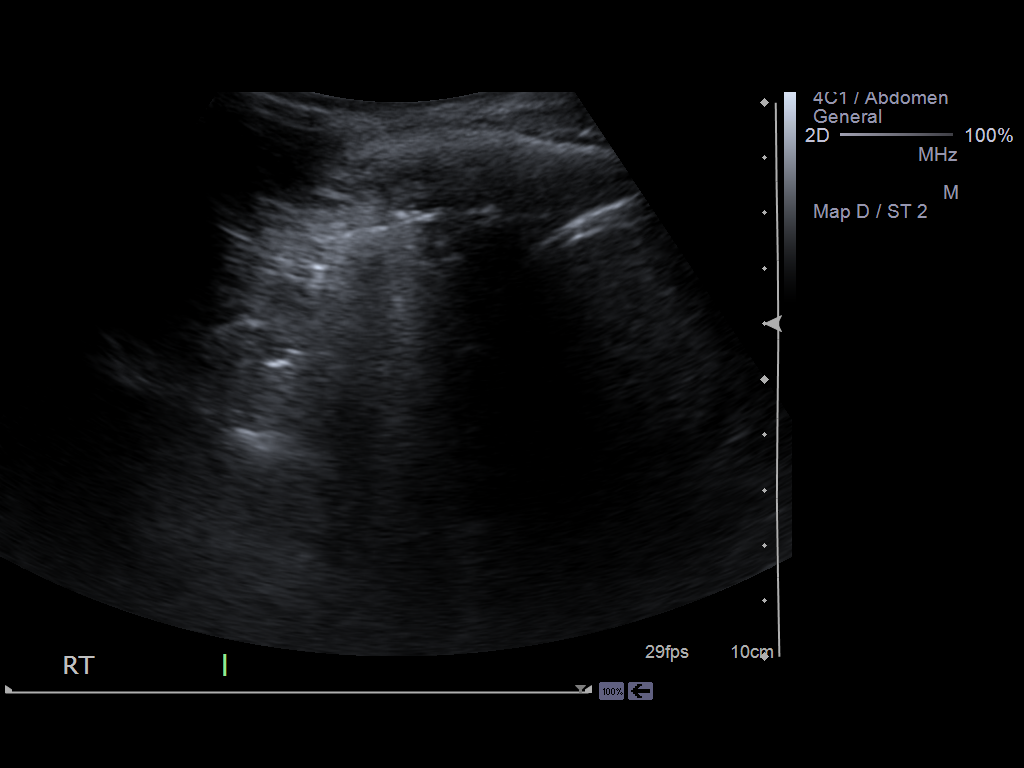
[im 7/7]
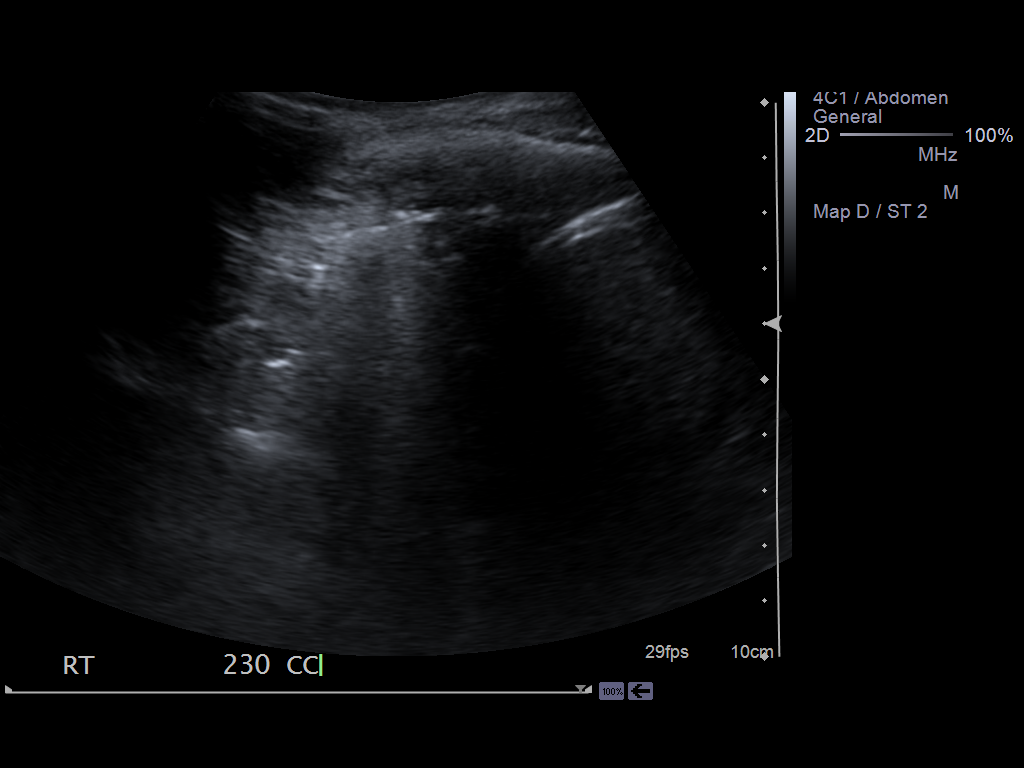

[7 of 7 positions shown; findings below may reference images not displayed]

An ultrasound guided thoracentesis was thoroughly discussed with
the patient and questions answered.  The benefits, risks,
alternatives and complications were also discussed.  The patient
understands and wishes to proceed with the procedure.  Written
consent was obtained.

Ultrasound was performed to localize and mark an adequate pocket of
fluid in the right chest.  The area was then prepped and draped in
the normal sterile fashion.  1% Lidocaine was used for local
anesthesia.  Under real time ultrasound guidance a 19 gauge Yueh
catheter was introduced.  Thoracentesis was performed.  The
catheter was removed and a dressing applied.

Complications:  None
FINDINGS: A total of approximately 230 ml of clear fluid was
removed. A fluid sample was sent for cytologic analysis.
IMPRESSION: Successful ultrasound guided right thoracentesis yielding 230 ml of
pleural fluid.

## 2013-09-30 IMAGING — CT CT CHEST W/O CM
1 series · 16 of 32 positions shown, 20 images · non-contrast
Comparison: PET scan on 07/07/2013

CLINICAL DATA: History of lung carcinoma with right pleural
effusion and abnormal pleural enhancement by PET scan.  Evaluation
is performed for possible pleural biopsy.

CT CHEST WITHOUT CONTRAST
TECHNIQUE: Multidetector CT imaging of the chest was performed
following the standard protocol without IV contrast.

[Series 2: i-spiral 5.0 b30f · axial · 0.68mm/px · z∈[+1098,+1364]mm · 16 of 85 slices shown, 20 images]
[im 6/85  soft-tissue]
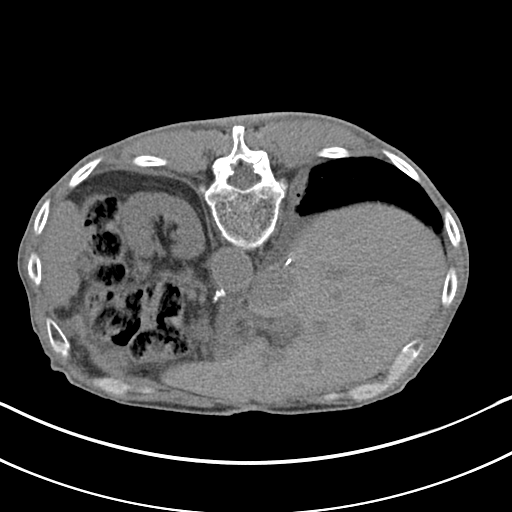
[im 6/85  bone]
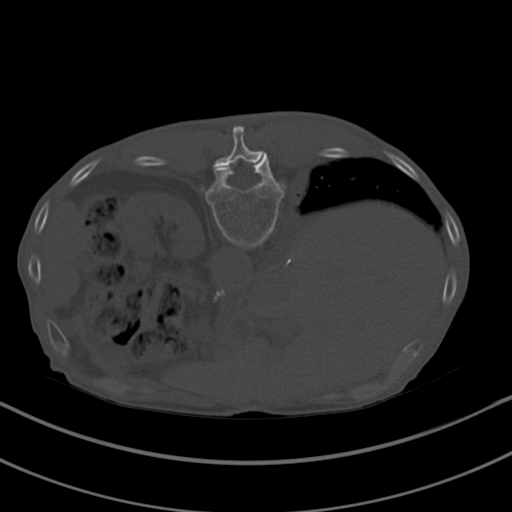
[im 11/85  soft-tissue]
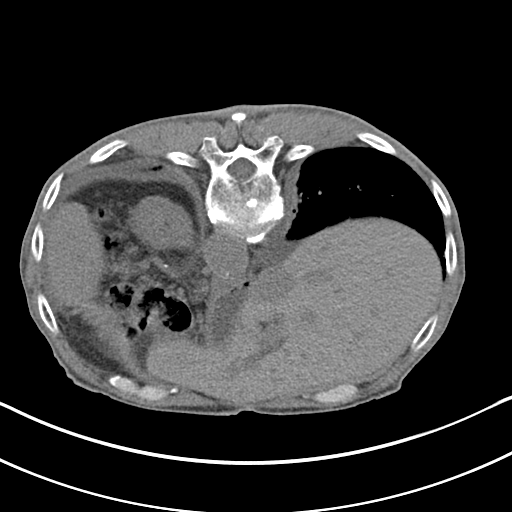
[im 17/85  soft-tissue]
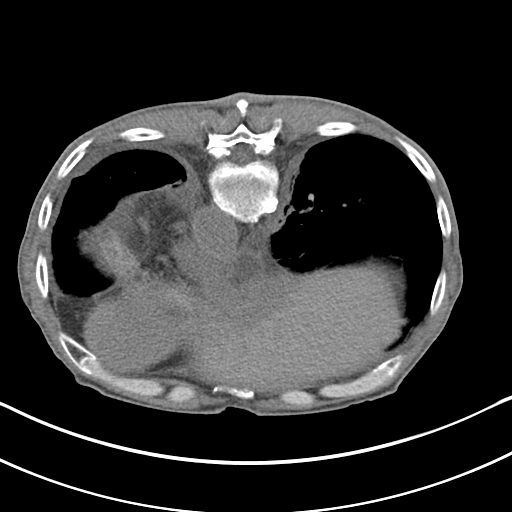
[im 22/85  soft-tissue]
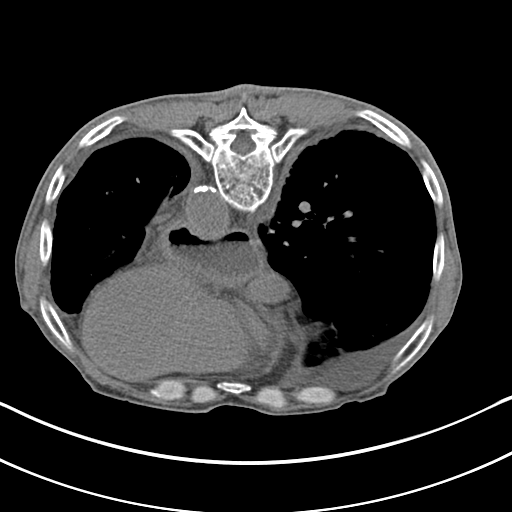
[im 28/85  soft-tissue]
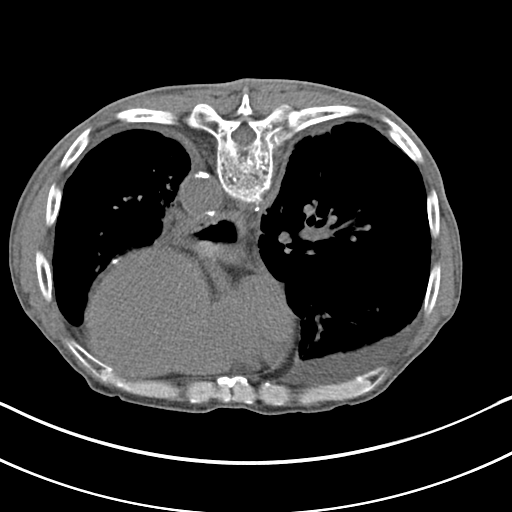
[im 33/85  soft-tissue]
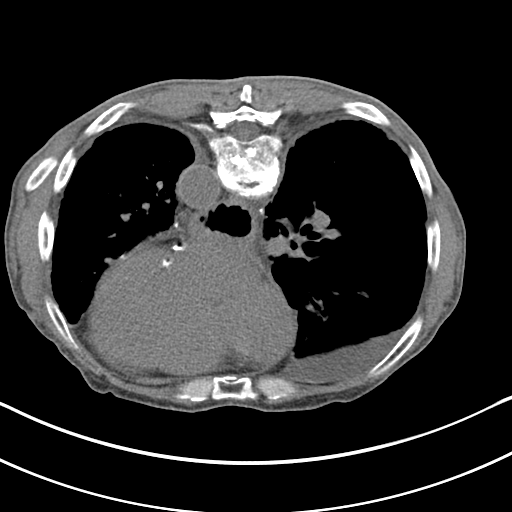
[im 38/85  soft-tissue]
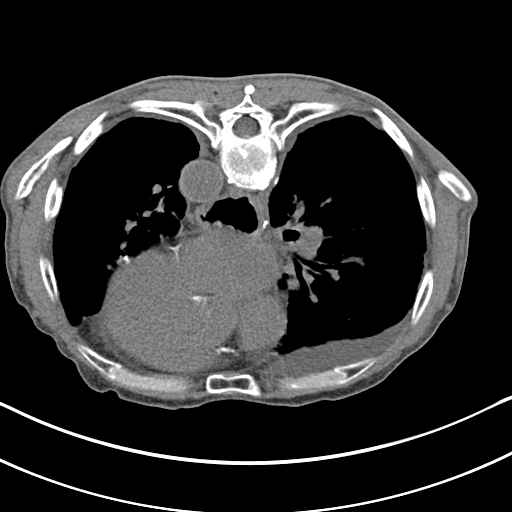
[im 47/85  soft-tissue]
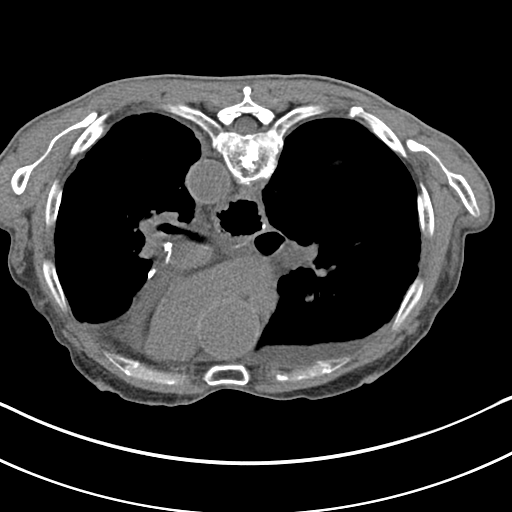
[im 52/85  soft-tissue]
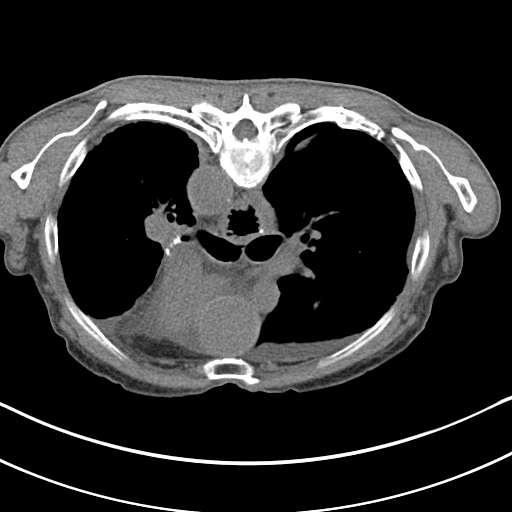
[im 52/85  bone]
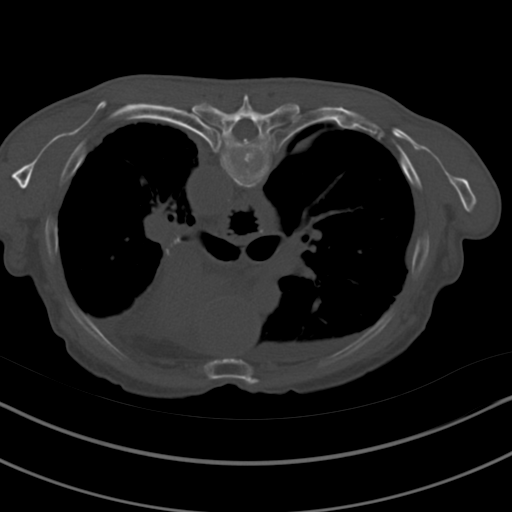
[im 57/85  soft-tissue]
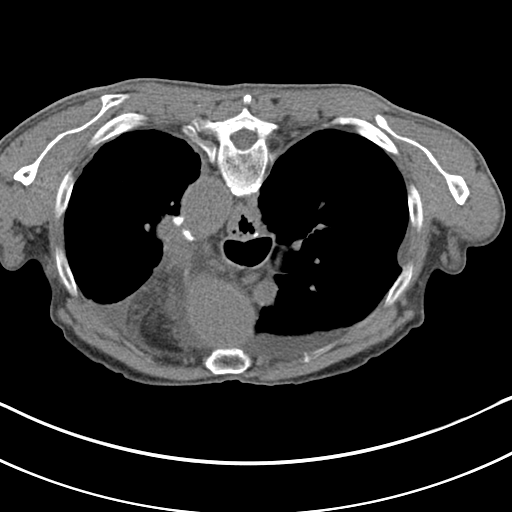
[im 63/85  soft-tissue]
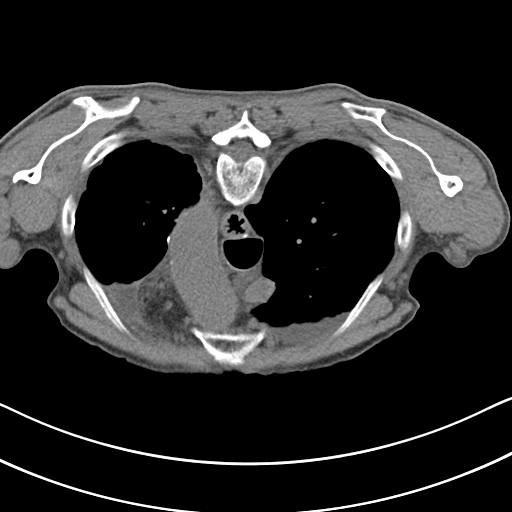
[im 68/85  soft-tissue]
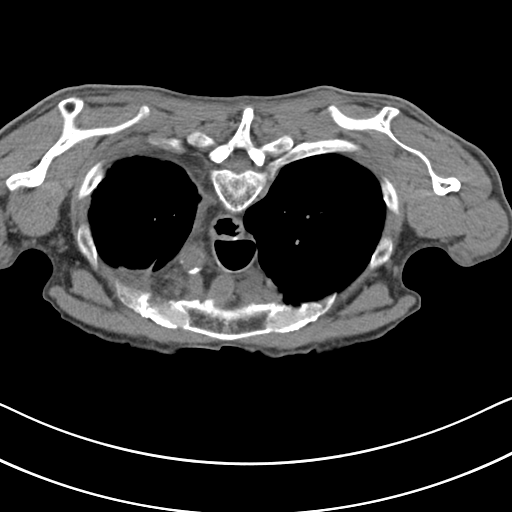
[im 74/85  soft-tissue]
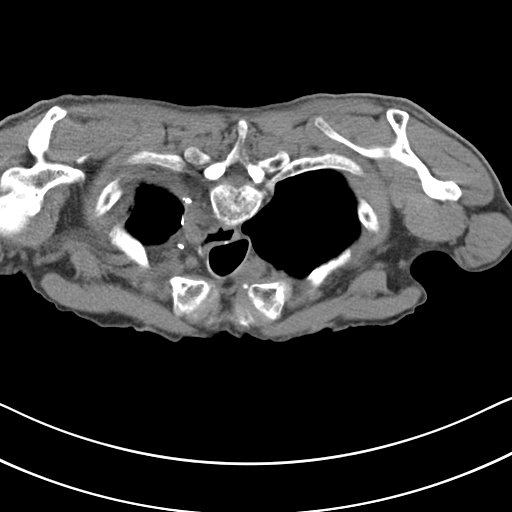
[im 74/85  lung]
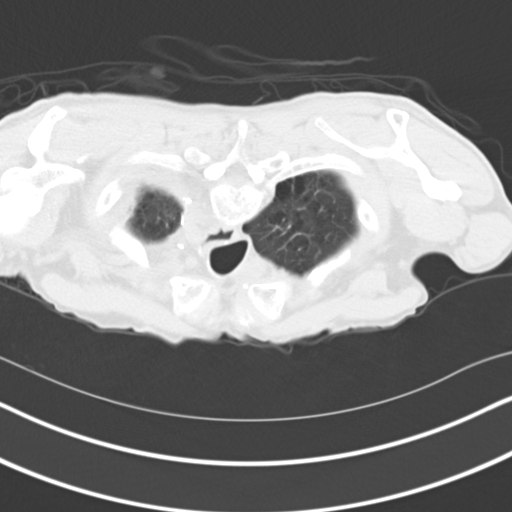
[im 76/85  lung]
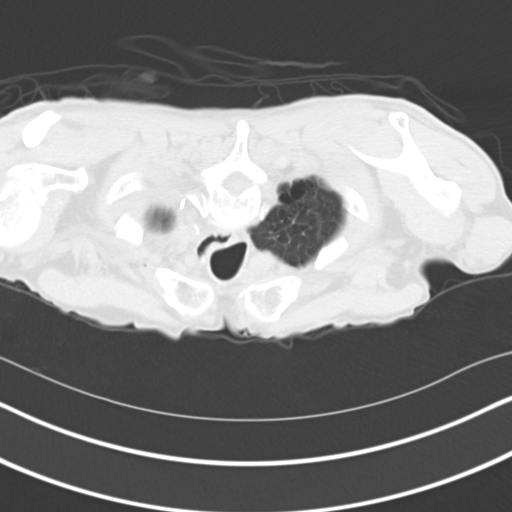
[im 79/85  soft-tissue]
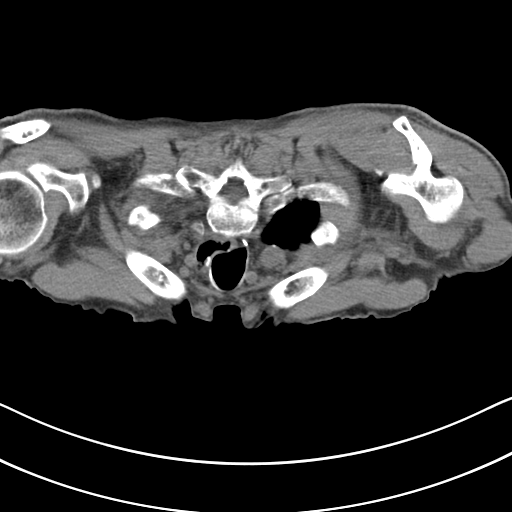
[im 79/85  lung]
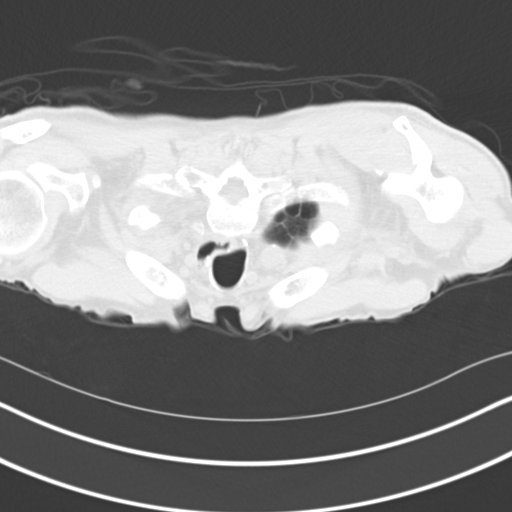
[im 82/85  lung]
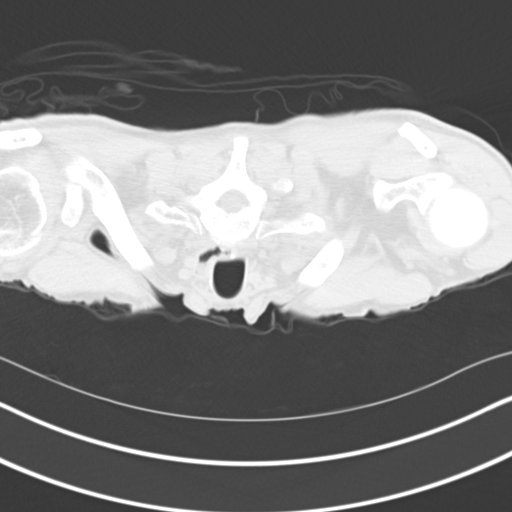

[16 of 32 positions shown; findings below may reference images not displayed]

FINDINGS: Imaging was performed in a prone position. There is no
evidence of right-sided pleural mass.  A small pleural effusion is
present which layers dependently in the anterior hemithorax when
the patient is in a prone position.  Significant COPD present.  No
pulmonary nodules are identified.
IMPRESSION: No evidence of pleural mass.  A small layering right-sided pleural
effusion is present. An ultrasound-guided right-sided thoracentesis
was subsequently performed.

## 2013-09-30 IMAGING — CR DG CHEST 1V
1 series · 1 of 1 positions shown · non-contrast
Comparison: 12/05/2011 chest radiograph, PET CT 07/07/2013

CLINICAL DATA: Post right thoracentesis

CHEST - 1 VIEW

[w chest pa]
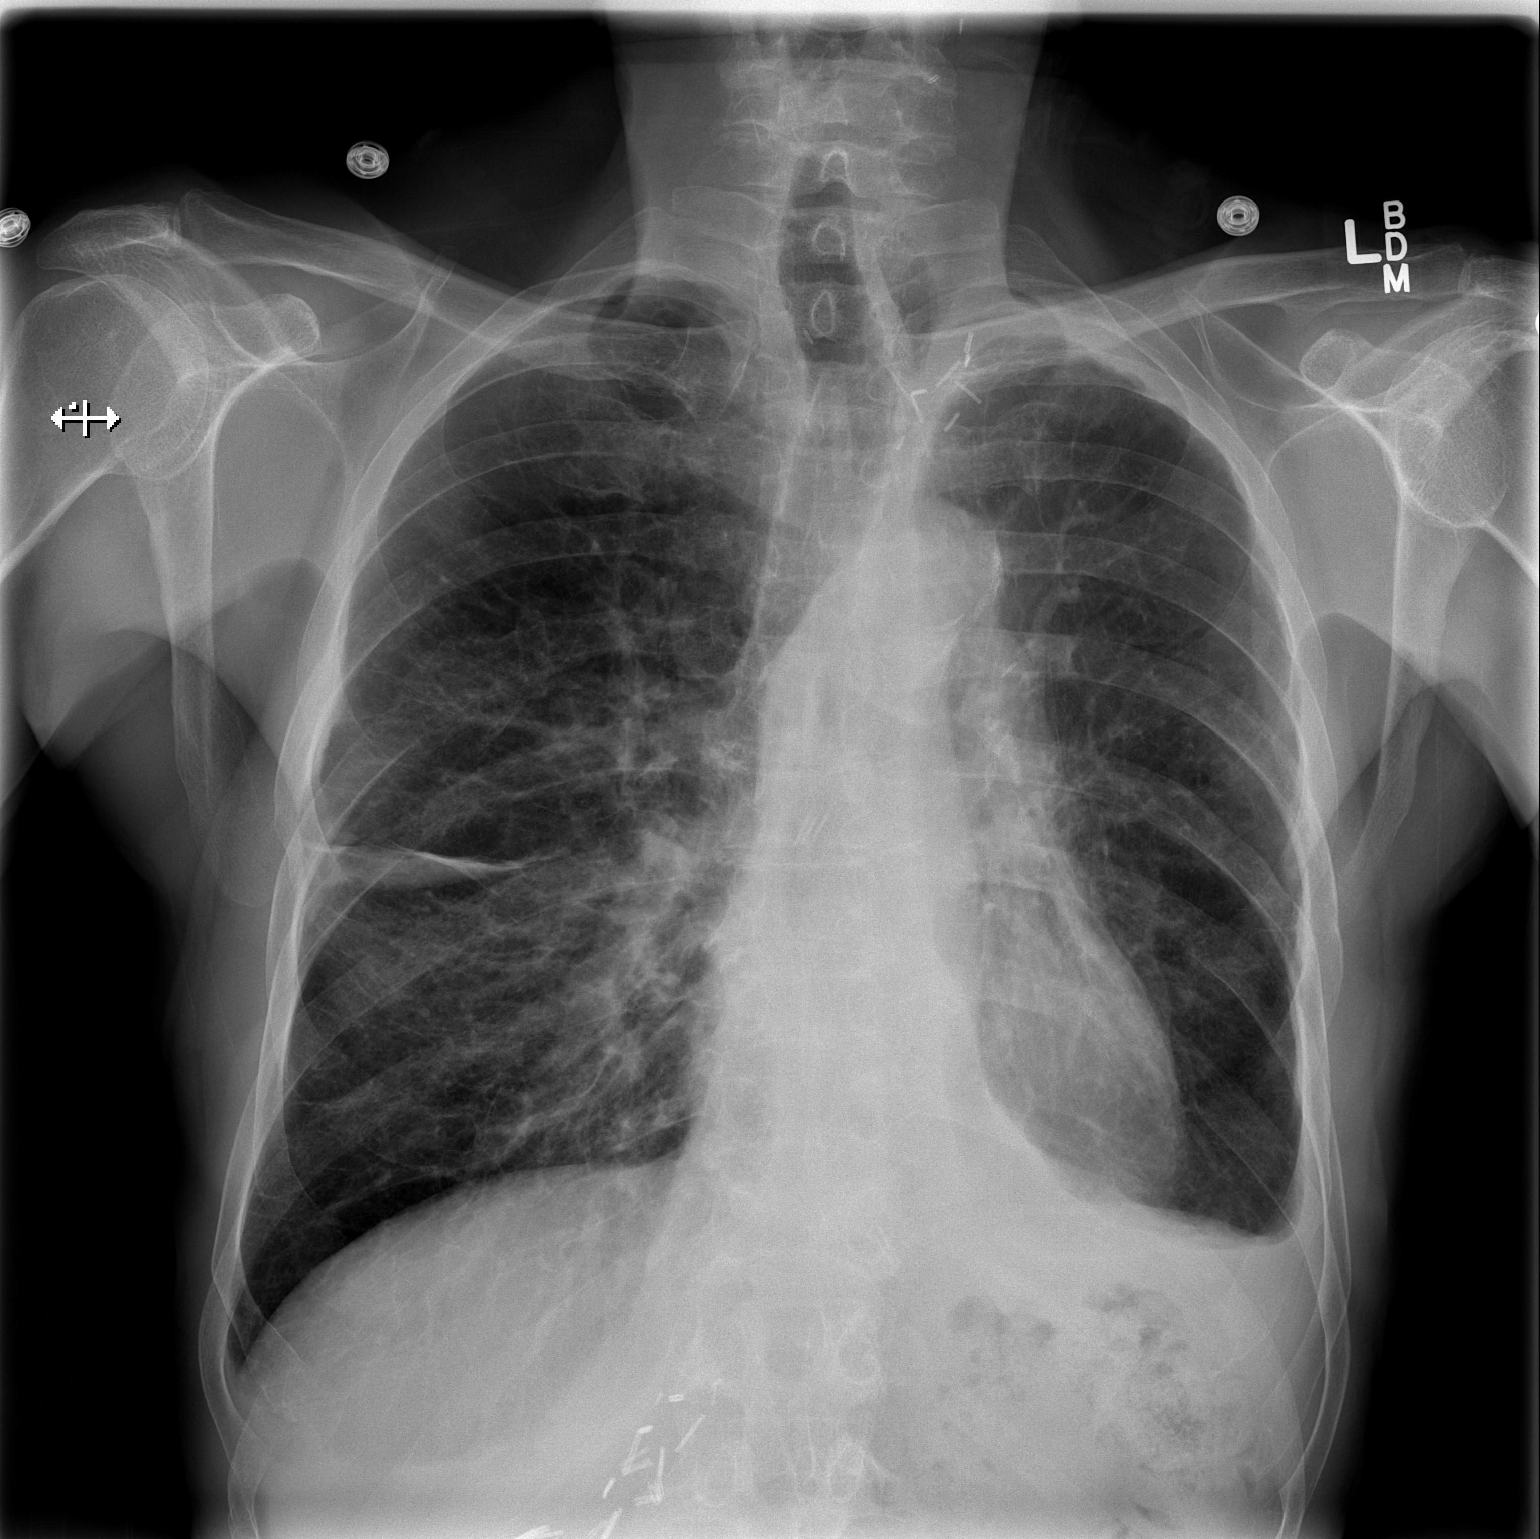

[1 of 1 positions shown; findings below may reference images not displayed]

FINDINGS: A bulla at the right base is incidentally re-identified.
Mild fluid tracking along the right minor fissure is noted.  Small
left effusion is present.  No pneumothorax.  Left apical clips are
present.  Heart size is normal.  Right upper quadrant clips.
Emphysematous changes and patchy ill-defined predominately right
lower lobe airspace opacity is identified.
IMPRESSION: No pneumothorax after thoracentesis.

## 2013-10-15 ENCOUNTER — Other Ambulatory Visit: Payer: Self-pay

## 2013-10-17 IMAGING — CR DG CHEST 1V PORT
1 series · 1 of 1 positions shown · non-contrast
Comparison: 07/13/2013

CLINICAL DATA: Shortness of breath, cough, congestion.

EXAM:
PORTABLE CHEST - 1 VIEW

[AP]
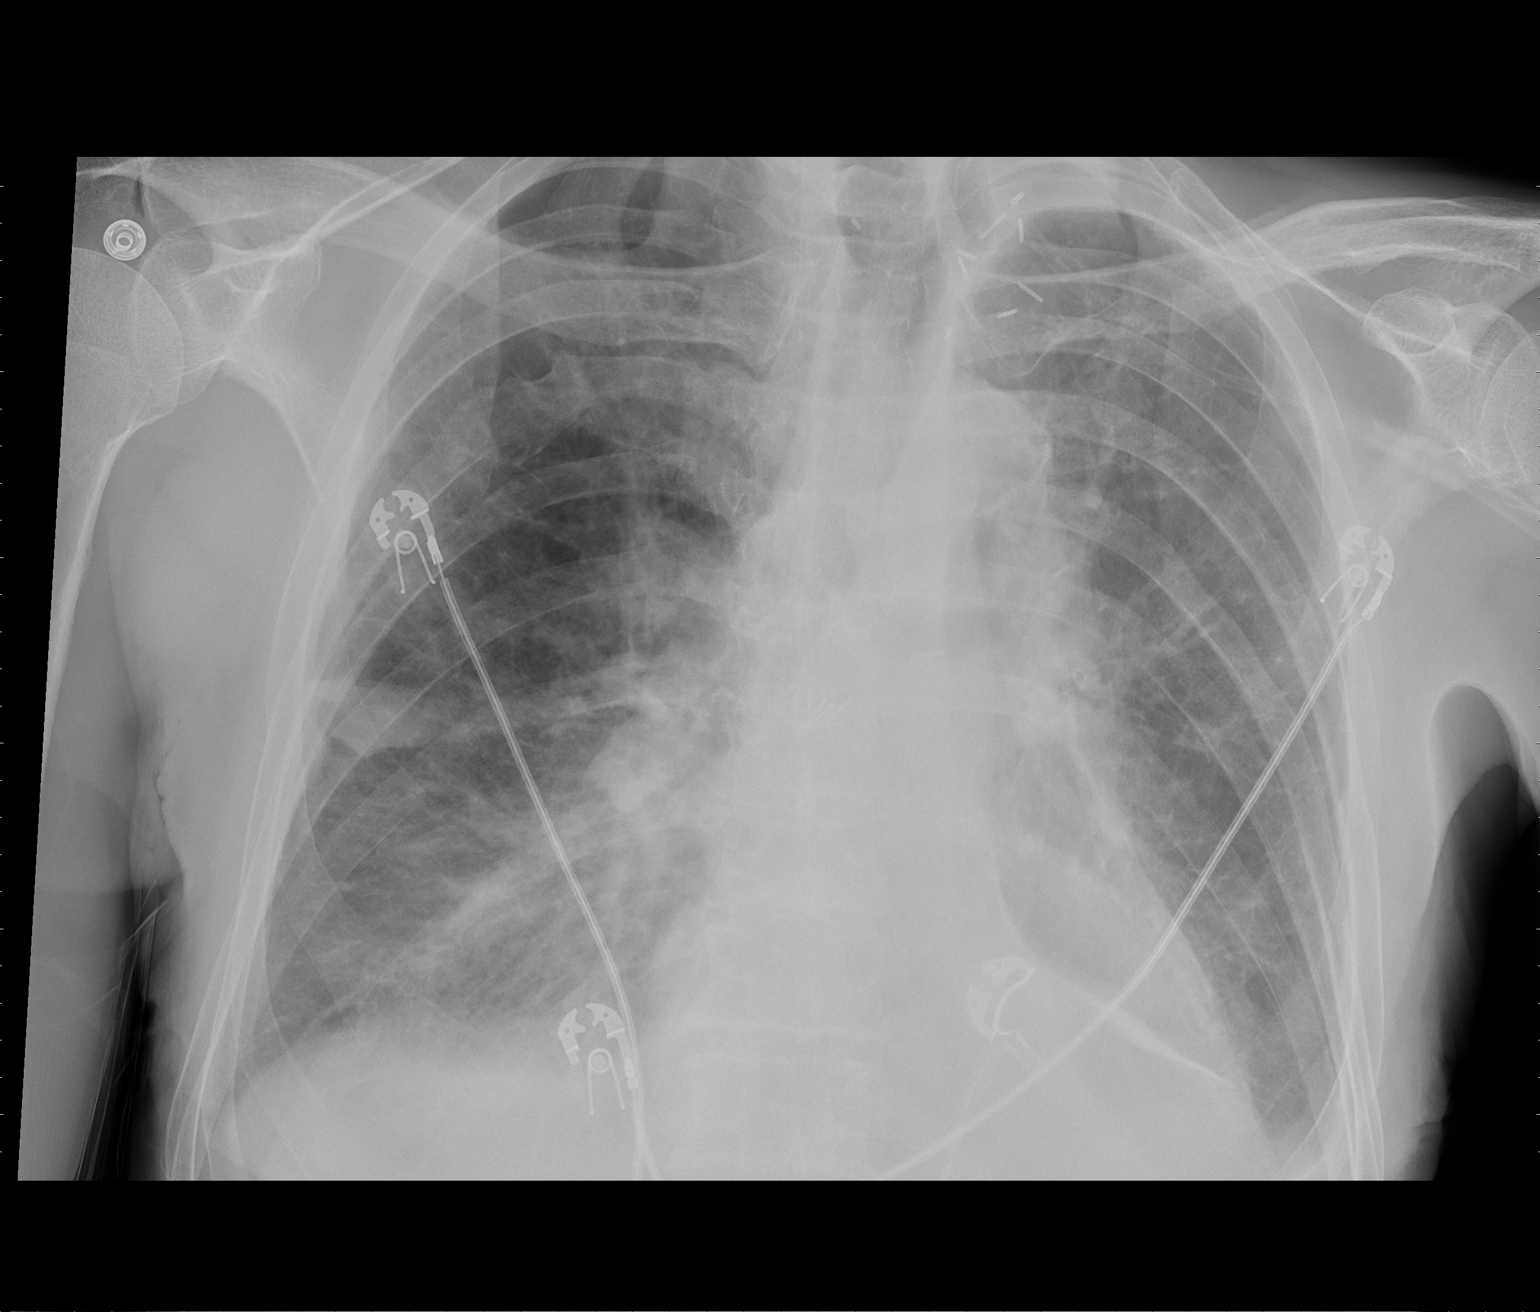

[1 of 1 positions shown; findings below may reference images not displayed]

FINDINGS: There is hyperinflation of the lungs compatible with COPD. Bilateral
perihilar and lower lobe airspace opacities. This could reflect
edema or infection. Small bilateral effusions. Heart is borderline
in size. No acute bony abnormality.
IMPRESSION: Bilateral lower lobe airspace opacities with effusions. FINDINGS
could reflect edema/ CHF or pneumonia. Recommend clinical
correlation.

COPD.

## 2013-10-18 IMAGING — CR DG CHEST 1V PORT
1 series · 1 of 1 positions shown · non-contrast
Comparison: [DATE] and 07/13/2013

CLINICAL DATA: Coronary artery disease.  Status post CABG.

PORTABLE CHEST - 1 VIEW

[AP]
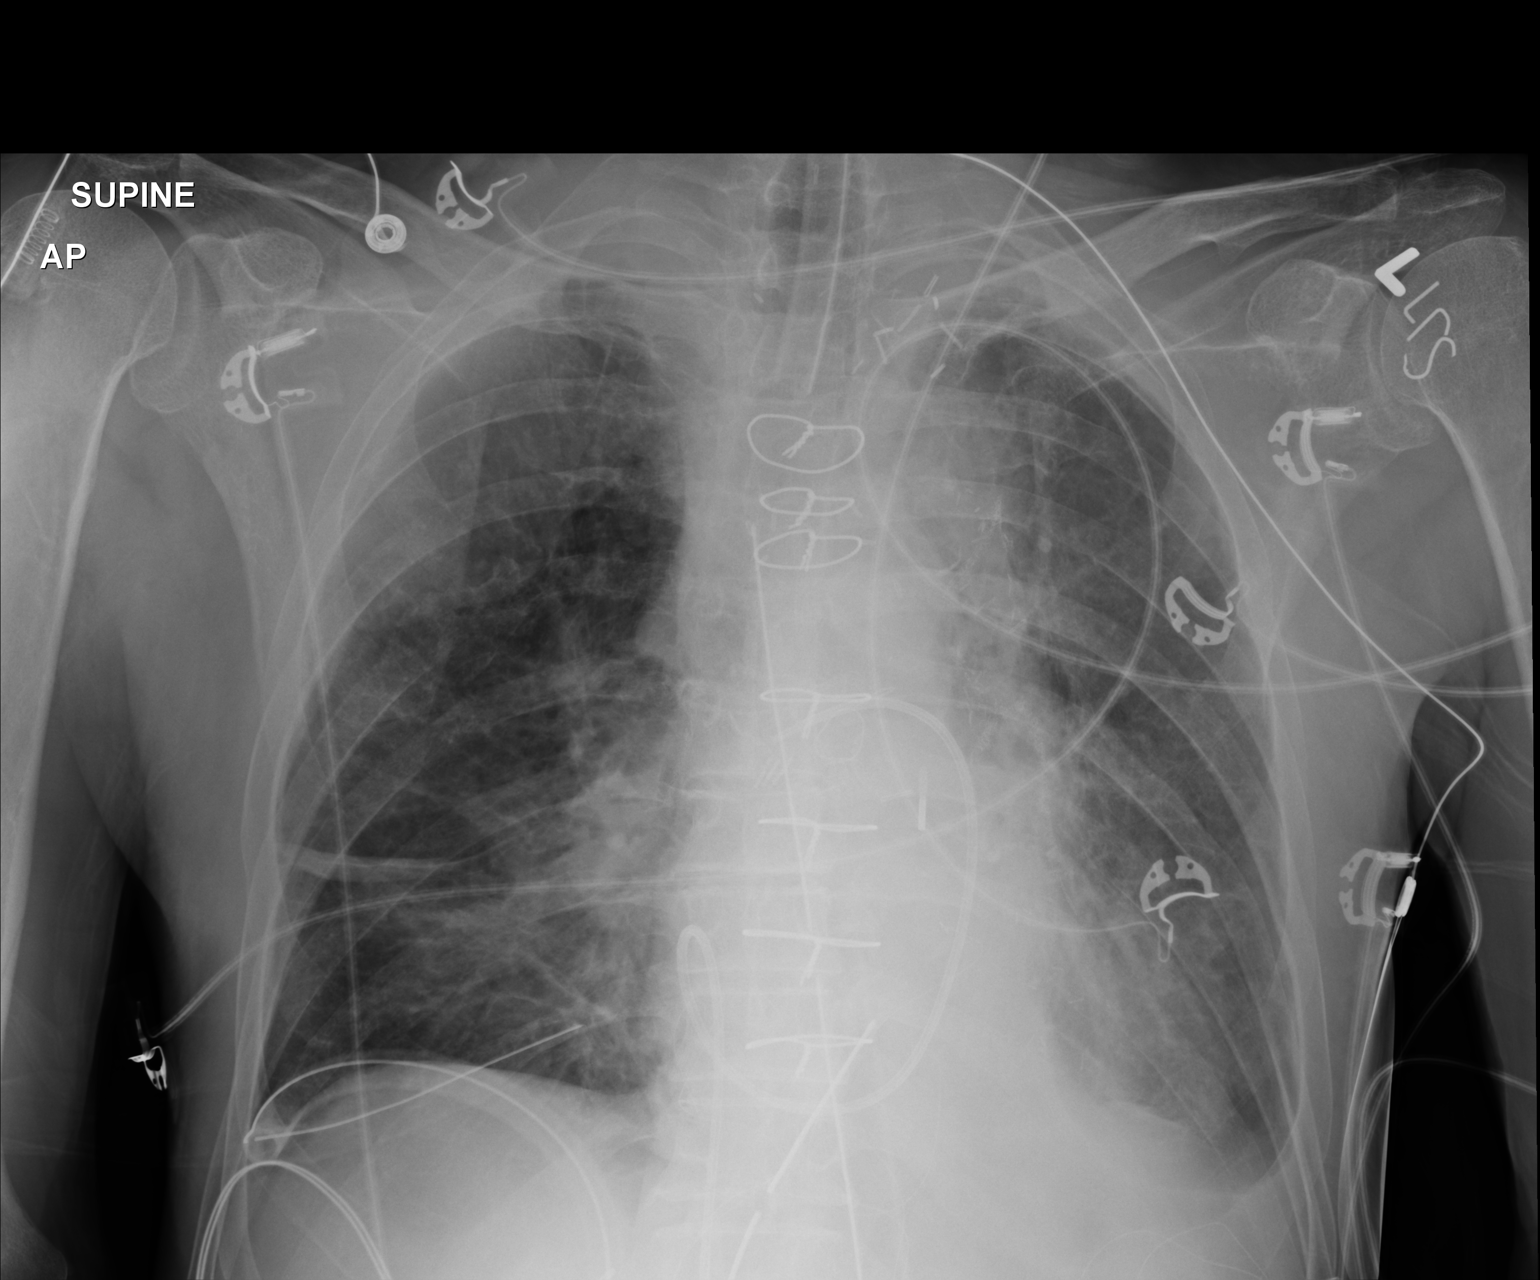

[1 of 1 positions shown; findings below may reference images not displayed]

FINDINGS: Endotracheal tube, Swan-Ganz catheter, and chest tubes
are in place.

No pneumothorax.  Chronic blunting of the left costophrenic angle
with chronic pleural thickening. Prior left upper lobectomy.
Aeration at the right base is improved with a chest tube at the
right base.
IMPRESSION: Satisfactory appearance of the chest after CABG.  No pneumothorax.
Tubes and lines appear in good position.

## 2013-10-18 IMAGING — CR DG CHEST 1V PORT
2 series · 2 of 2 positions shown · non-contrast
Comparison: 07/31/2013 [DATE] hours

CLINICAL DATA: Status post coronary bypass grafting and balloon
pump placement

PORTABLE CHEST - 1 VIEW

[AP (1 of 2)]
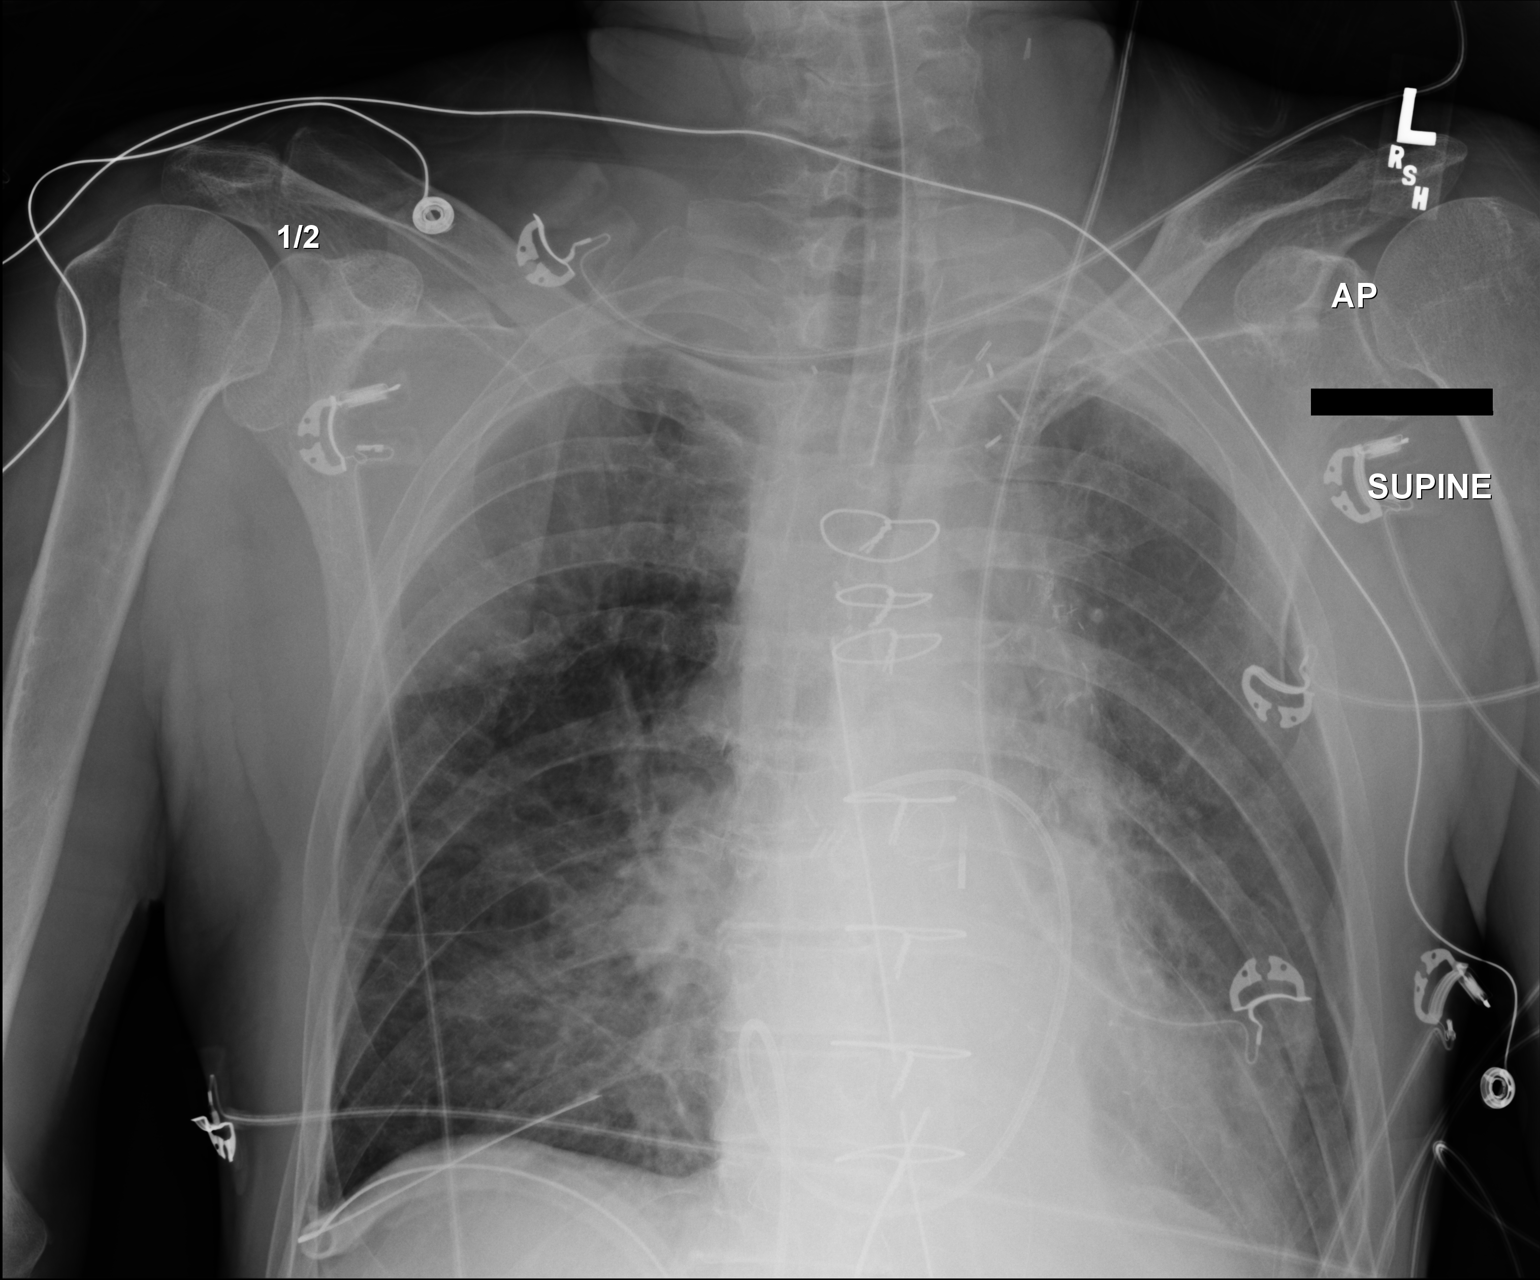

[AP (2 of 2)]
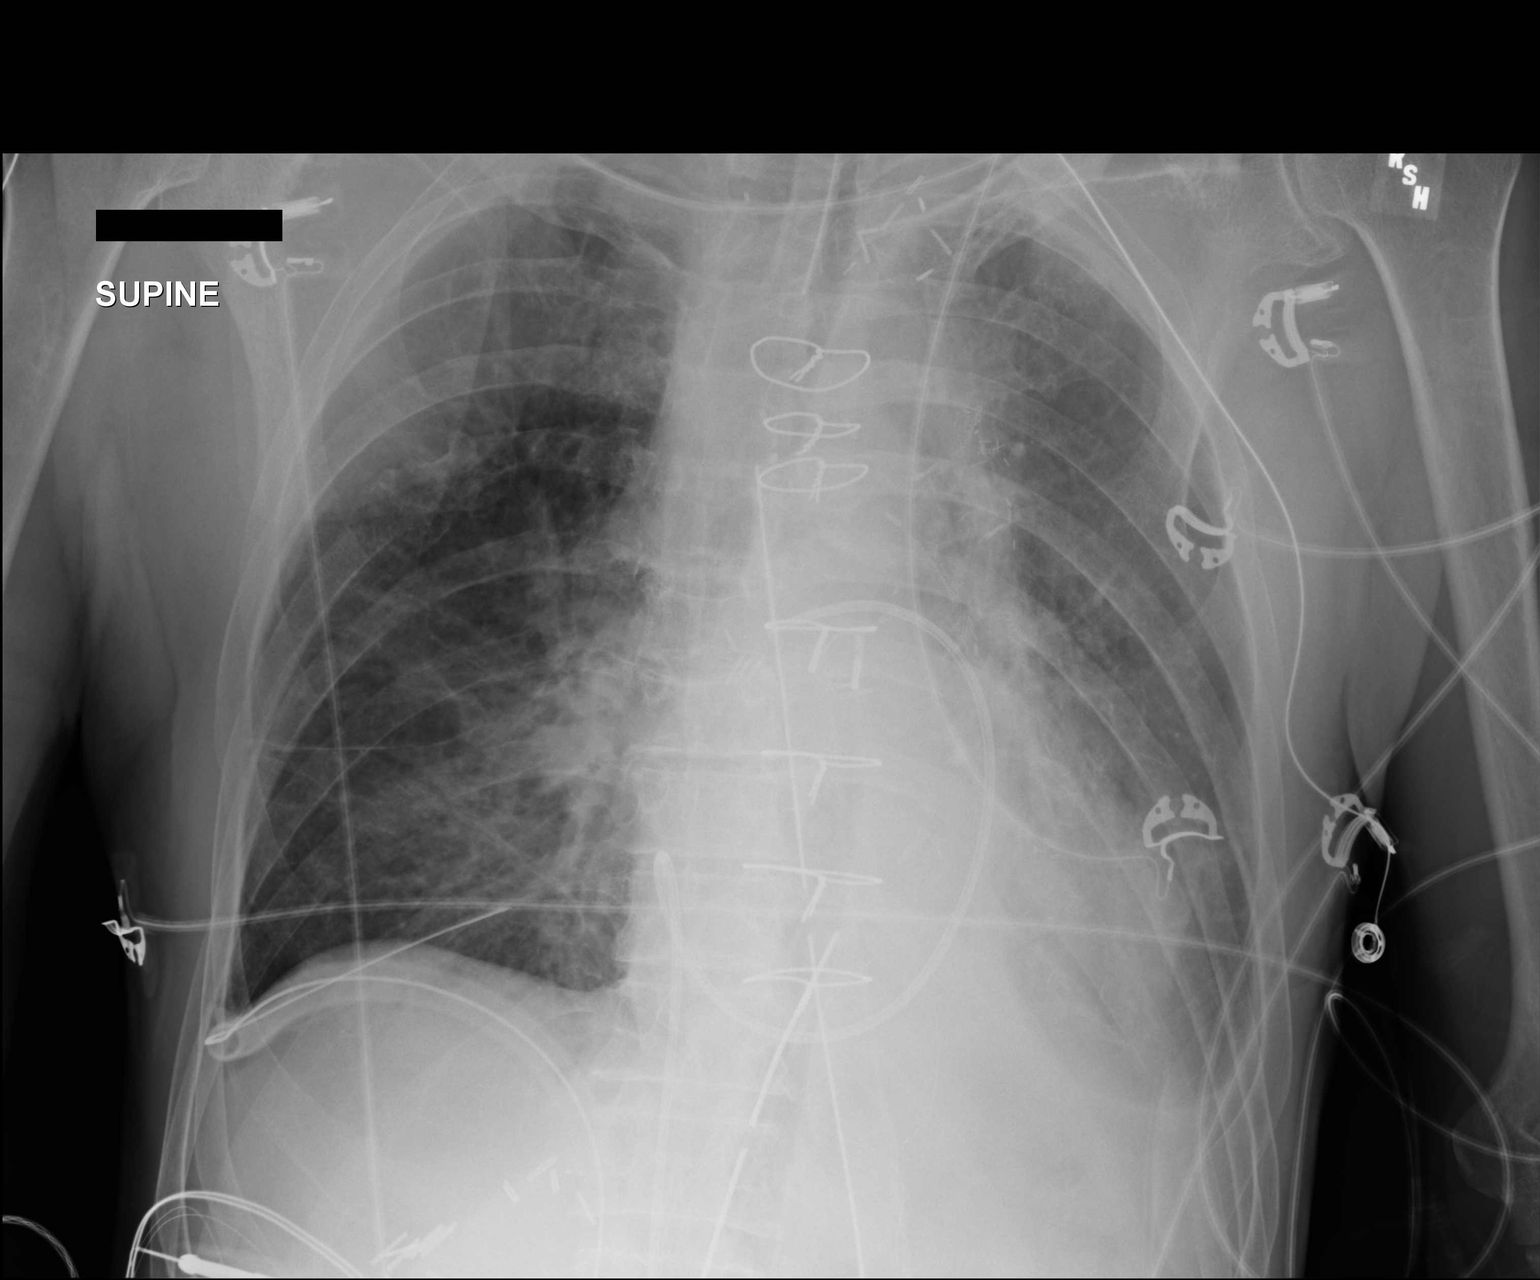

[2 of 2 positions shown; findings below may reference images not displayed]

FINDINGS: The endotracheal tube, mediastinal drains, Swan-Ganz
catheter and aortic balloon pump are again seen and stable.  A
right-sided thoracostomy catheter is again noted and stable.  No
pneumothorax is seen.  No focal infiltrate is noted.
IMPRESSION: Tubes and lines as described above.  No acute abnormality is noted.

## 2013-10-19 IMAGING — CR DG CHEST 1V PORT
1 series · 1 of 1 positions shown · non-contrast
Comparison: the previous day's study

CLINICAL DATA: Coronary bypass grafting

PORTABLE CHEST - 1 VIEW

[AP]
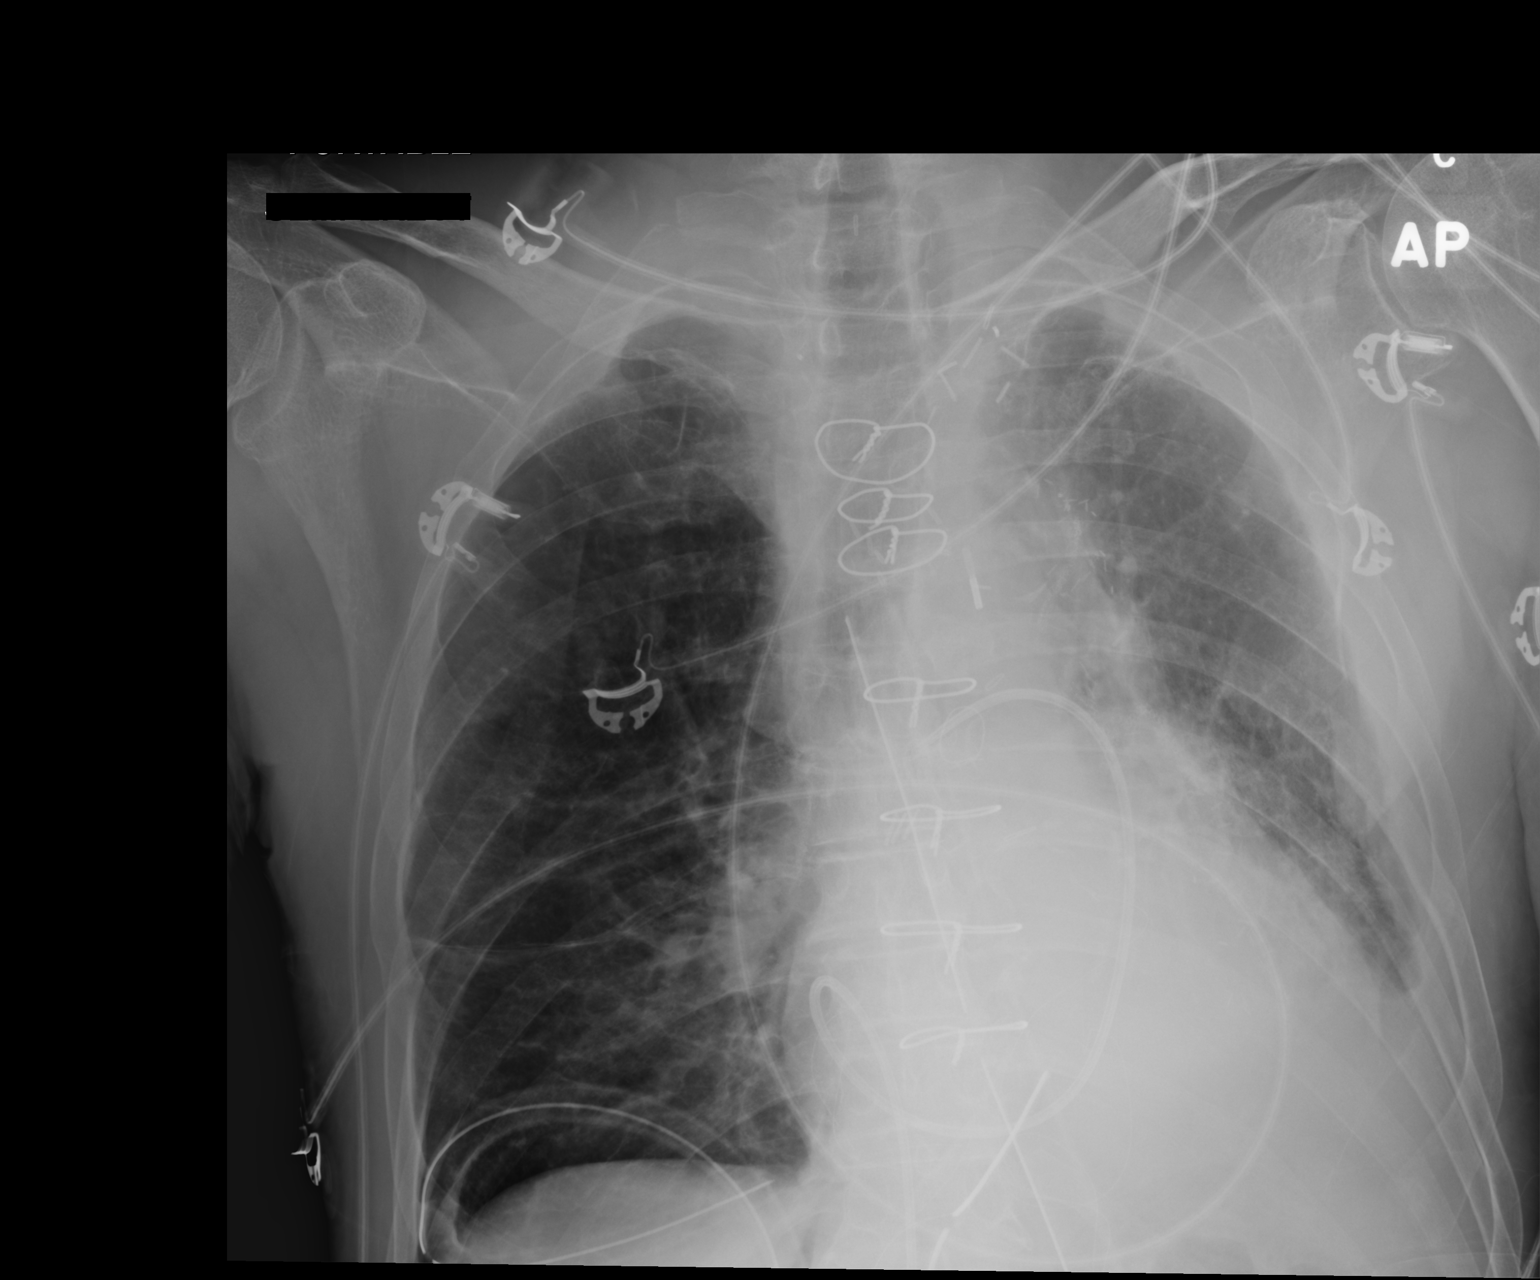

[1 of 1 positions shown; findings below may reference images not displayed]

FINDINGS: Interval extubation.  Swan-Ganz catheter, mediastinal
drains, and right chest tube stable position.  Question small
apical right pneumothorax.  Worsening left pleural effusion with
increase in left lower lung consolidation / atelectasis.  Mild
perihilar edema slightly improved.  IABP tip now projects at the
lower margin of the aortic arch.
IMPRESSION: 1.  Small right apical pneumothorax with stable chest tube
position.
2.  IABP tip at the level of the aortic arch.
3.  Increase in left pleural effusion and left lower lung
atelectasis/consolidation.

## 2013-10-20 IMAGING — CR DG CHEST 1V PORT
1 series · 1 of 1 positions shown · non-contrast
Comparison: the previous day's study

CLINICAL DATA: Postop cardiac surgery

PORTABLE CHEST - 1 VIEW

[AP]
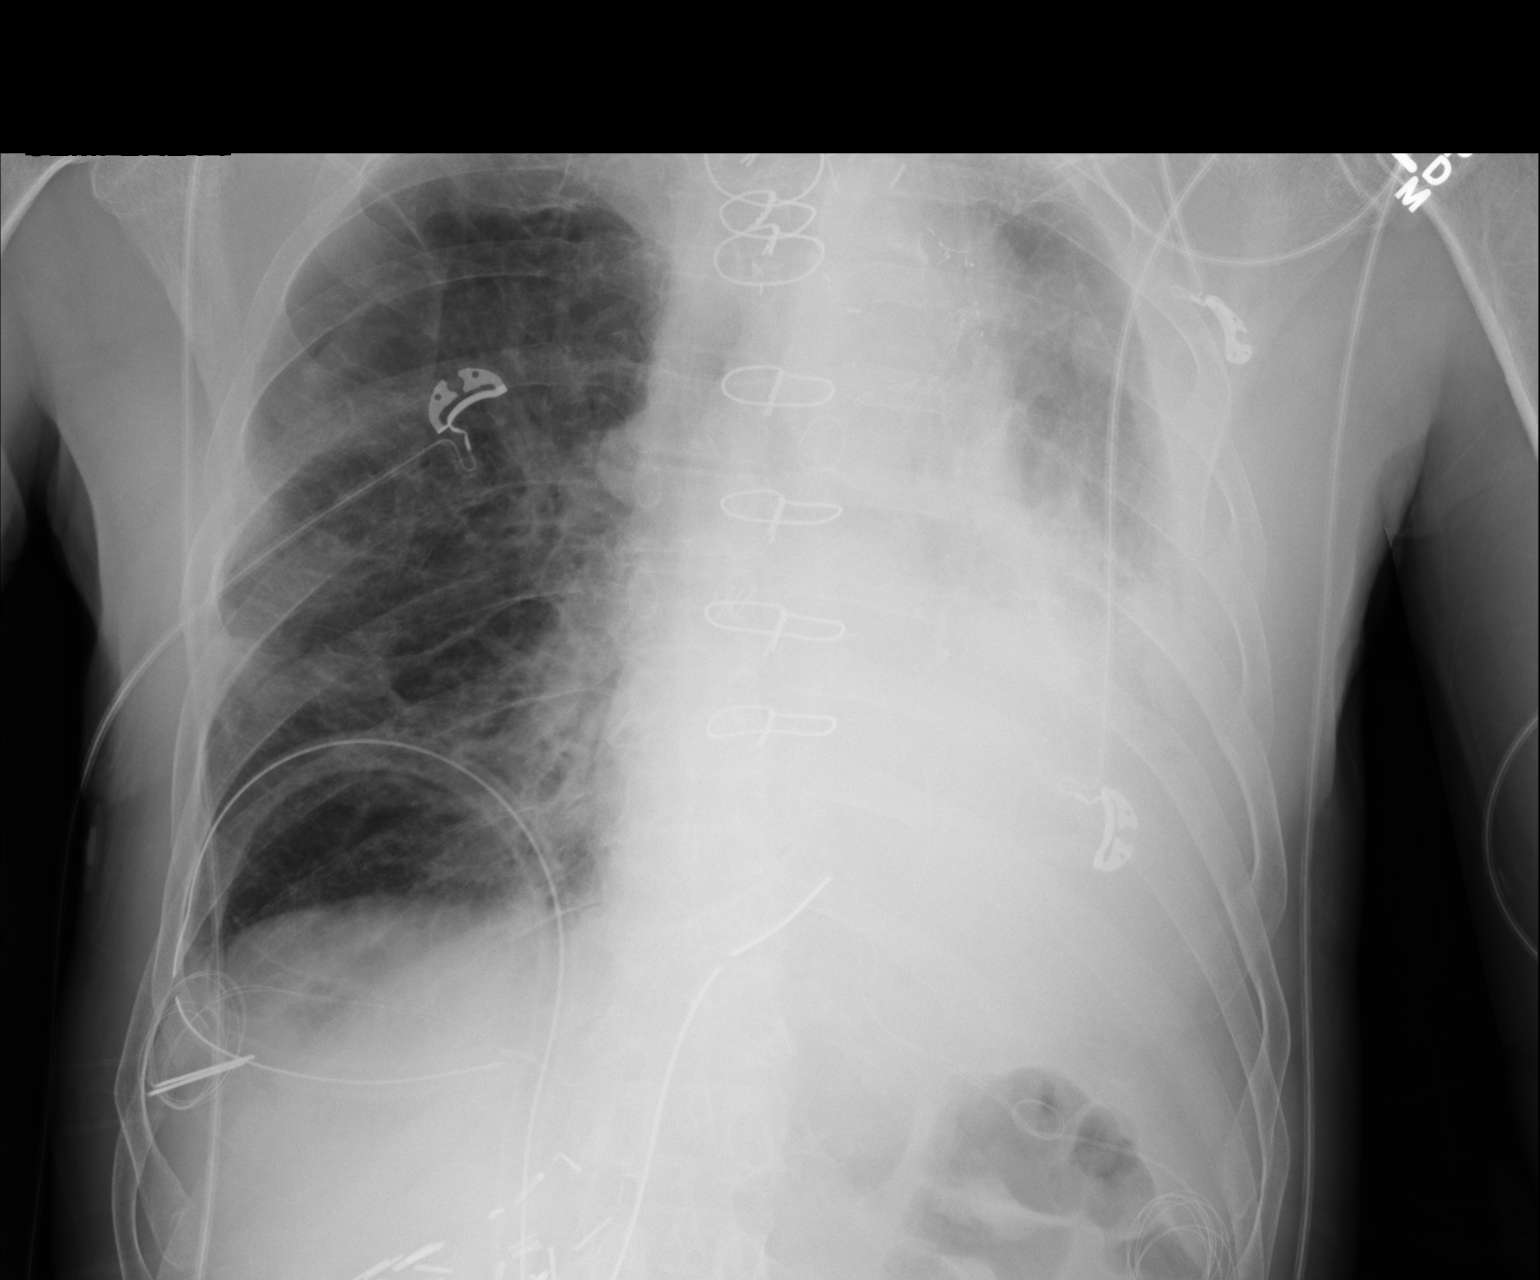

[1 of 1 positions shown; findings below may reference images not displayed]

FINDINGS: The Swan-Ganz catheter has been removed as has one of the
surgical mediastinal drains. The IABP tip no longer evident. Right
chest tube remains in place with no pneumothorax currently evident.
There has been some increase in moderate to large left pleural
effusion, with worsening consolidation / atelectasis through much
of the left lung.  Previous CABG.
IMPRESSION: 1.  Worsening left pleural effusion and left lung
atelectasis/consolidation.
2.  Stable right chest tube with no definite pneumothorax.

## 2013-10-21 ENCOUNTER — Other Ambulatory Visit: Payer: Self-pay | Admitting: Physician Assistant

## 2013-10-21 IMAGING — CR DG CHEST 1V PORT
1 series · 1 of 1 positions shown · non-contrast
Comparison: 08/02/2013.

CLINICAL DATA: Status post CABG, weakness.

PORTABLE CHEST - 1 VIEW

[AP]
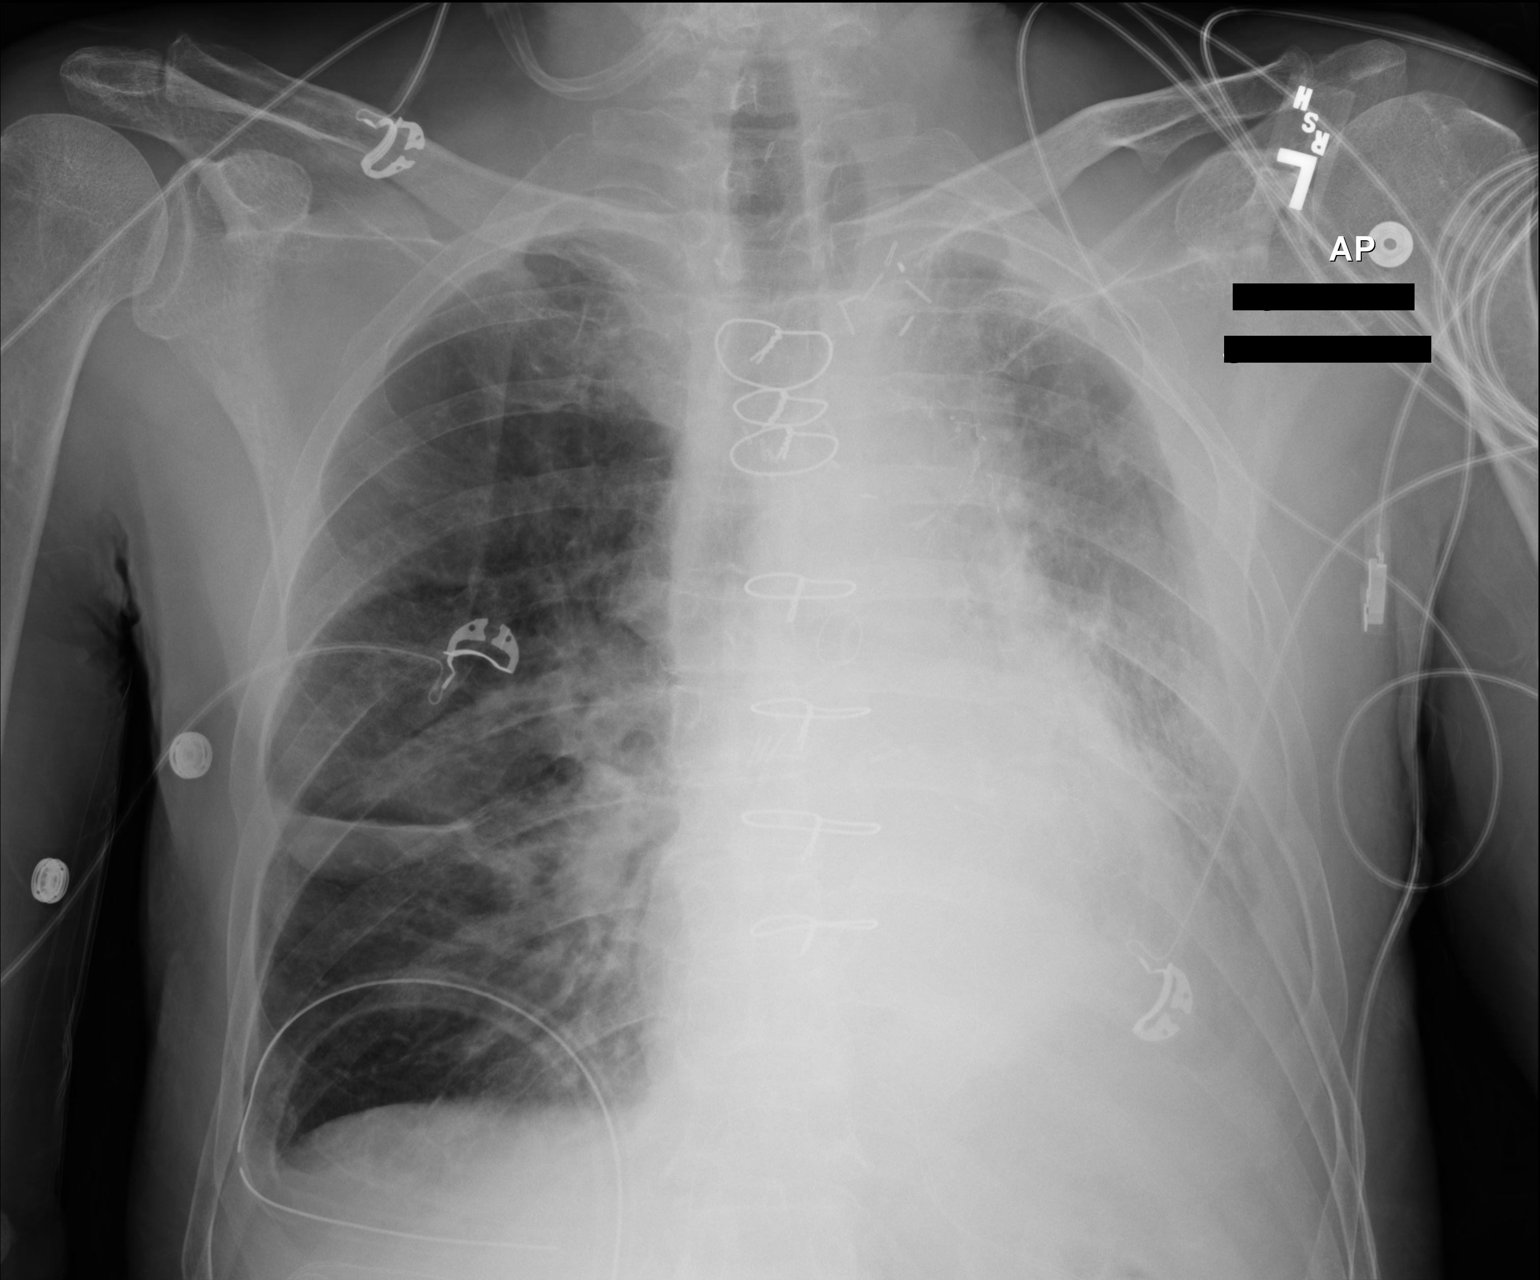

[1 of 1 positions shown; findings below may reference images not displayed]

FINDINGS: Trachea is midline.  Cardiomediastinal silhouette is
unchanged.  Sternotomy wires are unchanged in position.
Mediastinal drain has been removed in the interval.  Right chest
tube and epicardial pacer wires remain in place.

Postoperative changes and volume loss are seen in the left
hemithorax with superimposed airspace disease and somewhat
loculated appearing pleural fluid.  Small amount of pleural fluid
is seen along the minor fissure as well.  Mild right perihilar and
right basilar airspace disease.  Difficult to exclude a small
amount of pleural air along the minor fissure.
IMPRESSION: 1.  Probable small loculated right hydropneumothorax with right
chest tube in place.
2.  Right perihilar bibasilar air space disease, as before,
minimally worsened.
3.  Postoperative change and volume loss in the left hemithorax
with superimposed airspace disease and a partially loculated left
pleural effusion.

## 2013-10-22 IMAGING — CR DG CHEST 1V PORT
1 series · 1 of 1 positions shown · non-contrast
Comparison: 08/03/2013 and earlier priors and CT lung biopsy
07/13/2013

CLINICAL DATA: Status post CABG

PORTABLE CHEST - 1 VIEW

[AP]
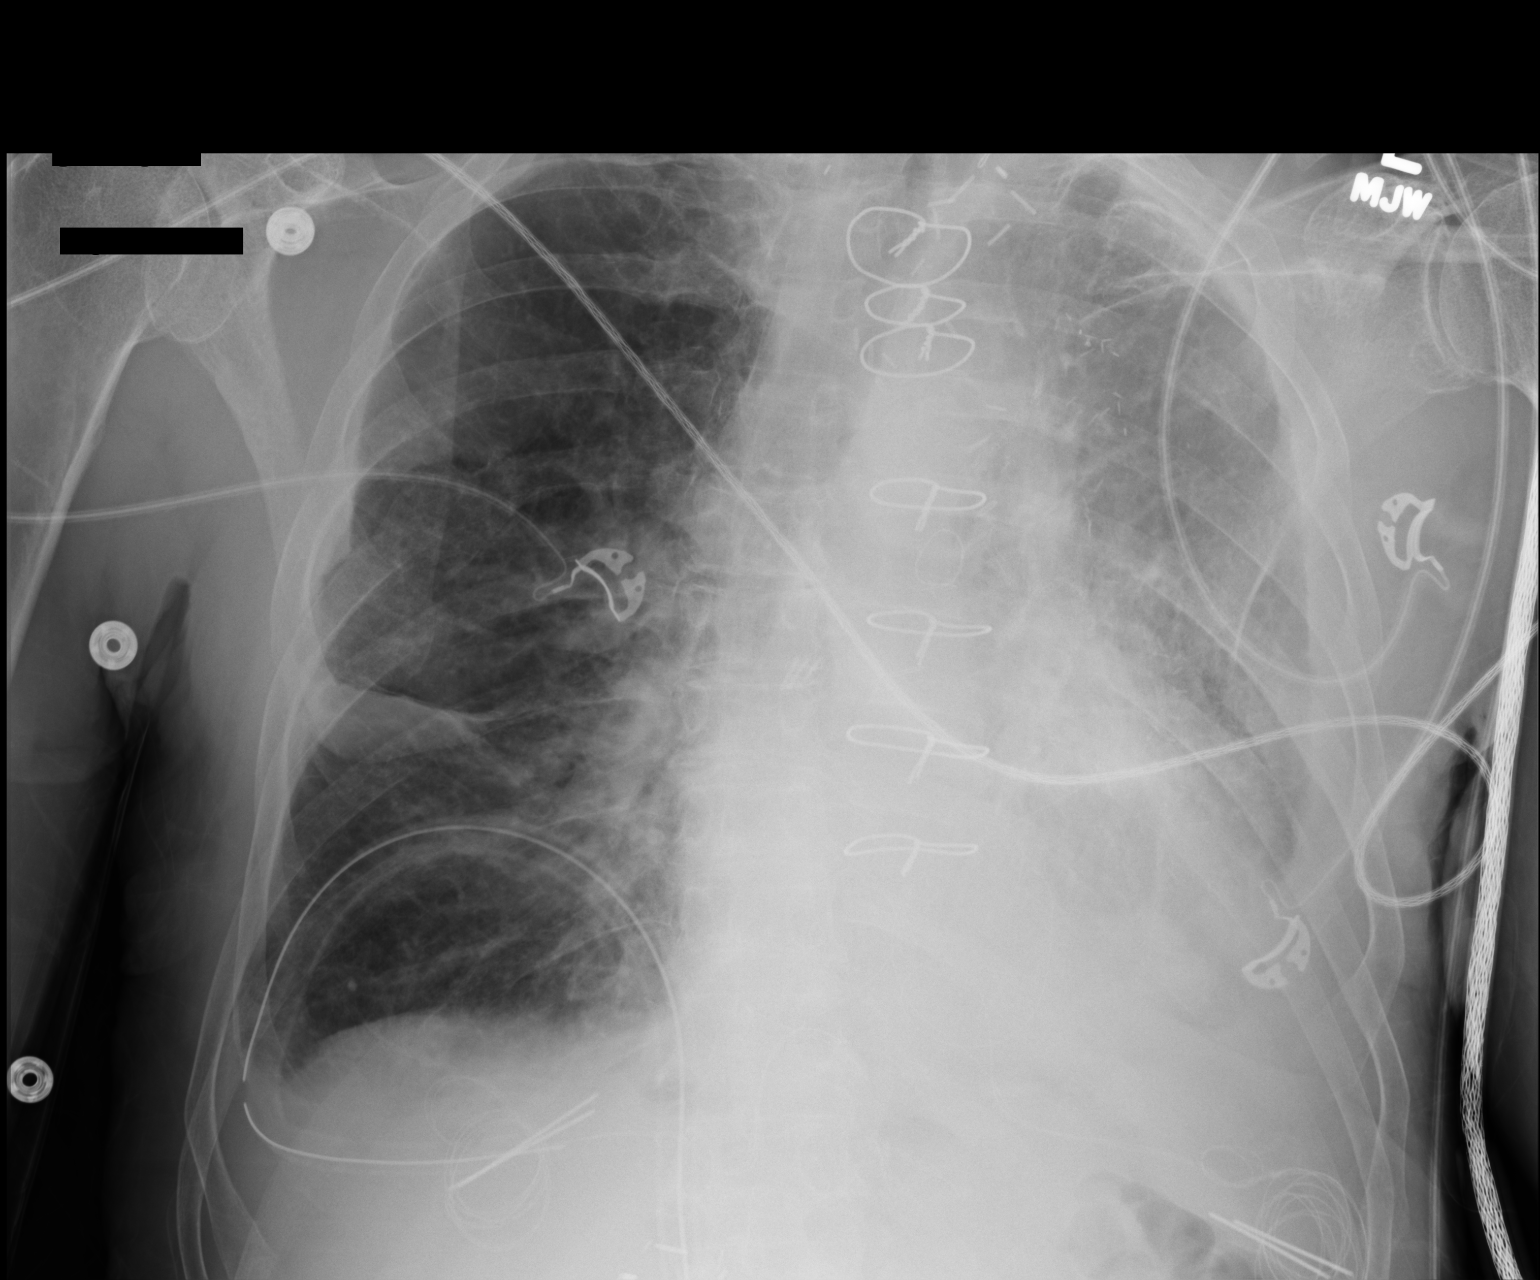

[1 of 1 positions shown; findings below may reference images not displayed]

FINDINGS: There are changes of median sternotomy, and postoperative
changes in the left hemithorax.  Probable small loculated right
hydropneumothorax with right chest tube in place is unchanged.
Hazy opacities throughout the left lung and left basilar opacity
likely reflect airspace disease.  There is a persistent small left
pleural effusion.  No pleural air is seen on the left.  Epicardial
pacing wires are noted.
IMPRESSION: 1. Postoperative changes of the left hemithorax with superimposed
airspace disease and small left pleural effusion, unchanged.
2.  Small loculated right hydropneumothorax with right chest tube
in place, stable.

## 2013-10-23 IMAGING — CR DG CHEST 1V PORT
1 series · 1 of 1 positions shown · non-contrast
Comparison: 08/04/2013 and CT chest 07/02/2013.

CLINICAL DATA: In size post CABG.

PORTABLE CHEST - 1 VIEW

[AP]
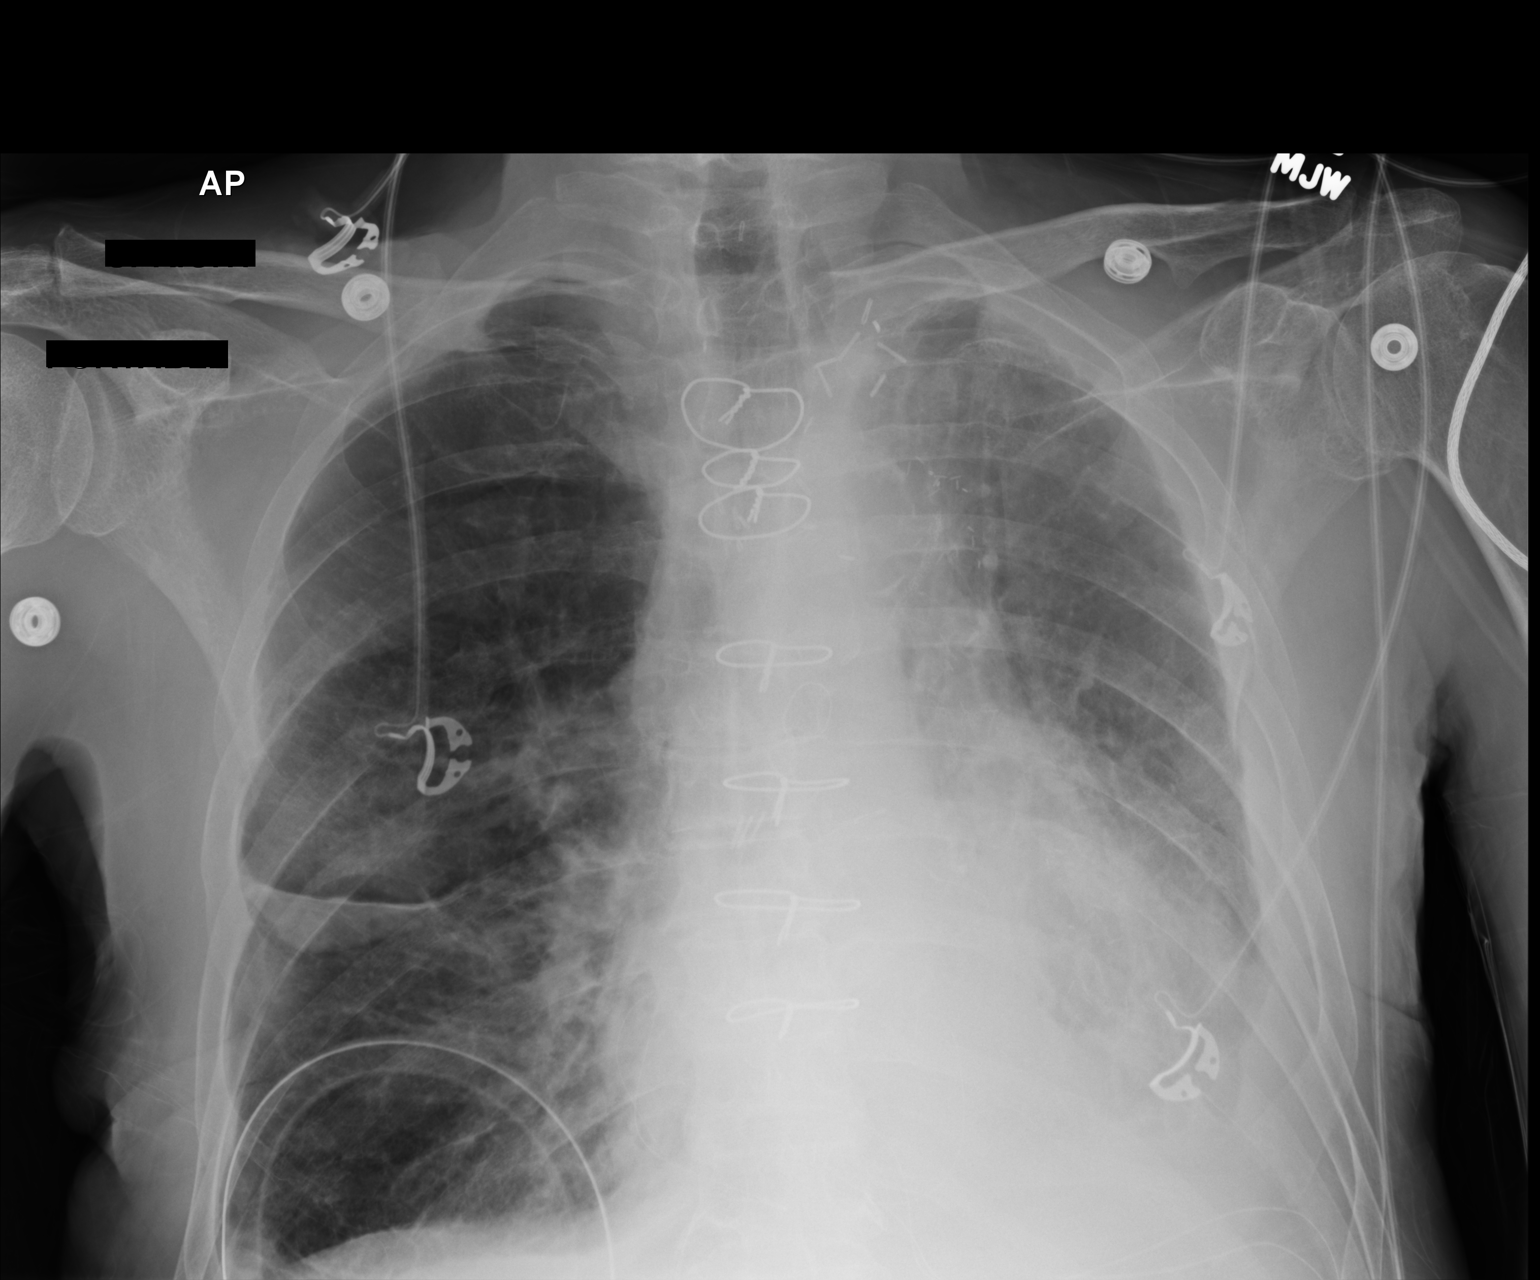

[1 of 1 positions shown; findings below may reference images not displayed]

FINDINGS: Trachea is midline.  Heart size is grossly stable.
Sternotomy wires are unchanged in position and alignment.

A partially loculated small to moderate left pleural effusion and
left basilar airspace disease persist. Postoperative changes in
volume loss in the left hemithorax.  Fluid and possible air along
the minor fissure are unchanged as well.  Right chest tube is seen
at the base of the right hemithorax.  Mild right mid and lower lung
zone airspace disease.  Epicardial pacer wires remain in place.
IMPRESSION: 1.  Stable small to moderate loculated left pleural effusion with
left basilar airspace disease.
2.  Small amount of fluid and probable air along the minor fissure,
with right chest tube in place, stable.
3.  Right mid and lower lung zone airspace disease, stable.

## 2013-10-24 IMAGING — CR DG CHEST 2V
2 series · 2 of 2 positions shown · non-contrast
Comparison: August 05, 2013.

CLINICAL DATA: Left-sided chest tube and airspace disease.

CHEST - 2 VIEW

[w chest pa]
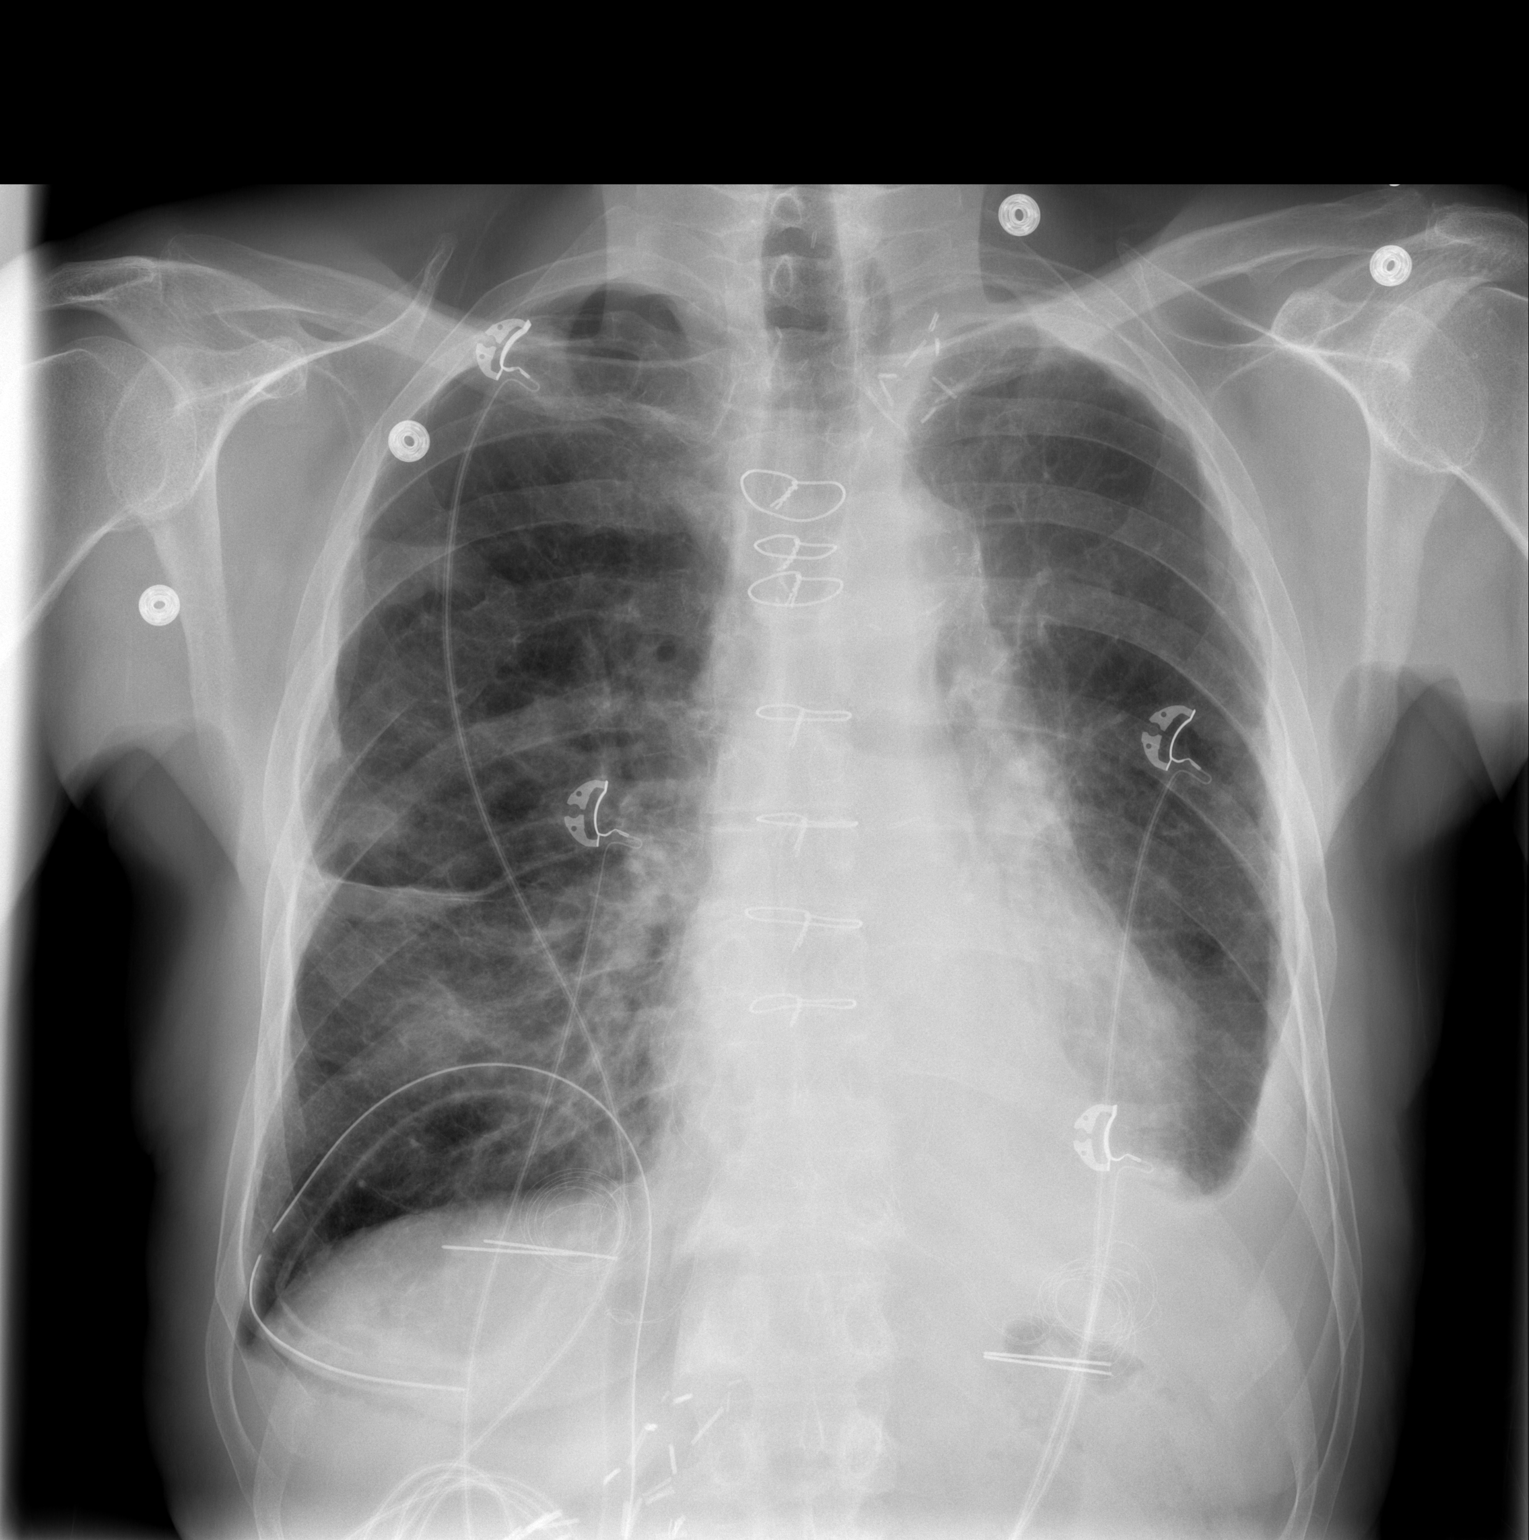

[w chest lat]
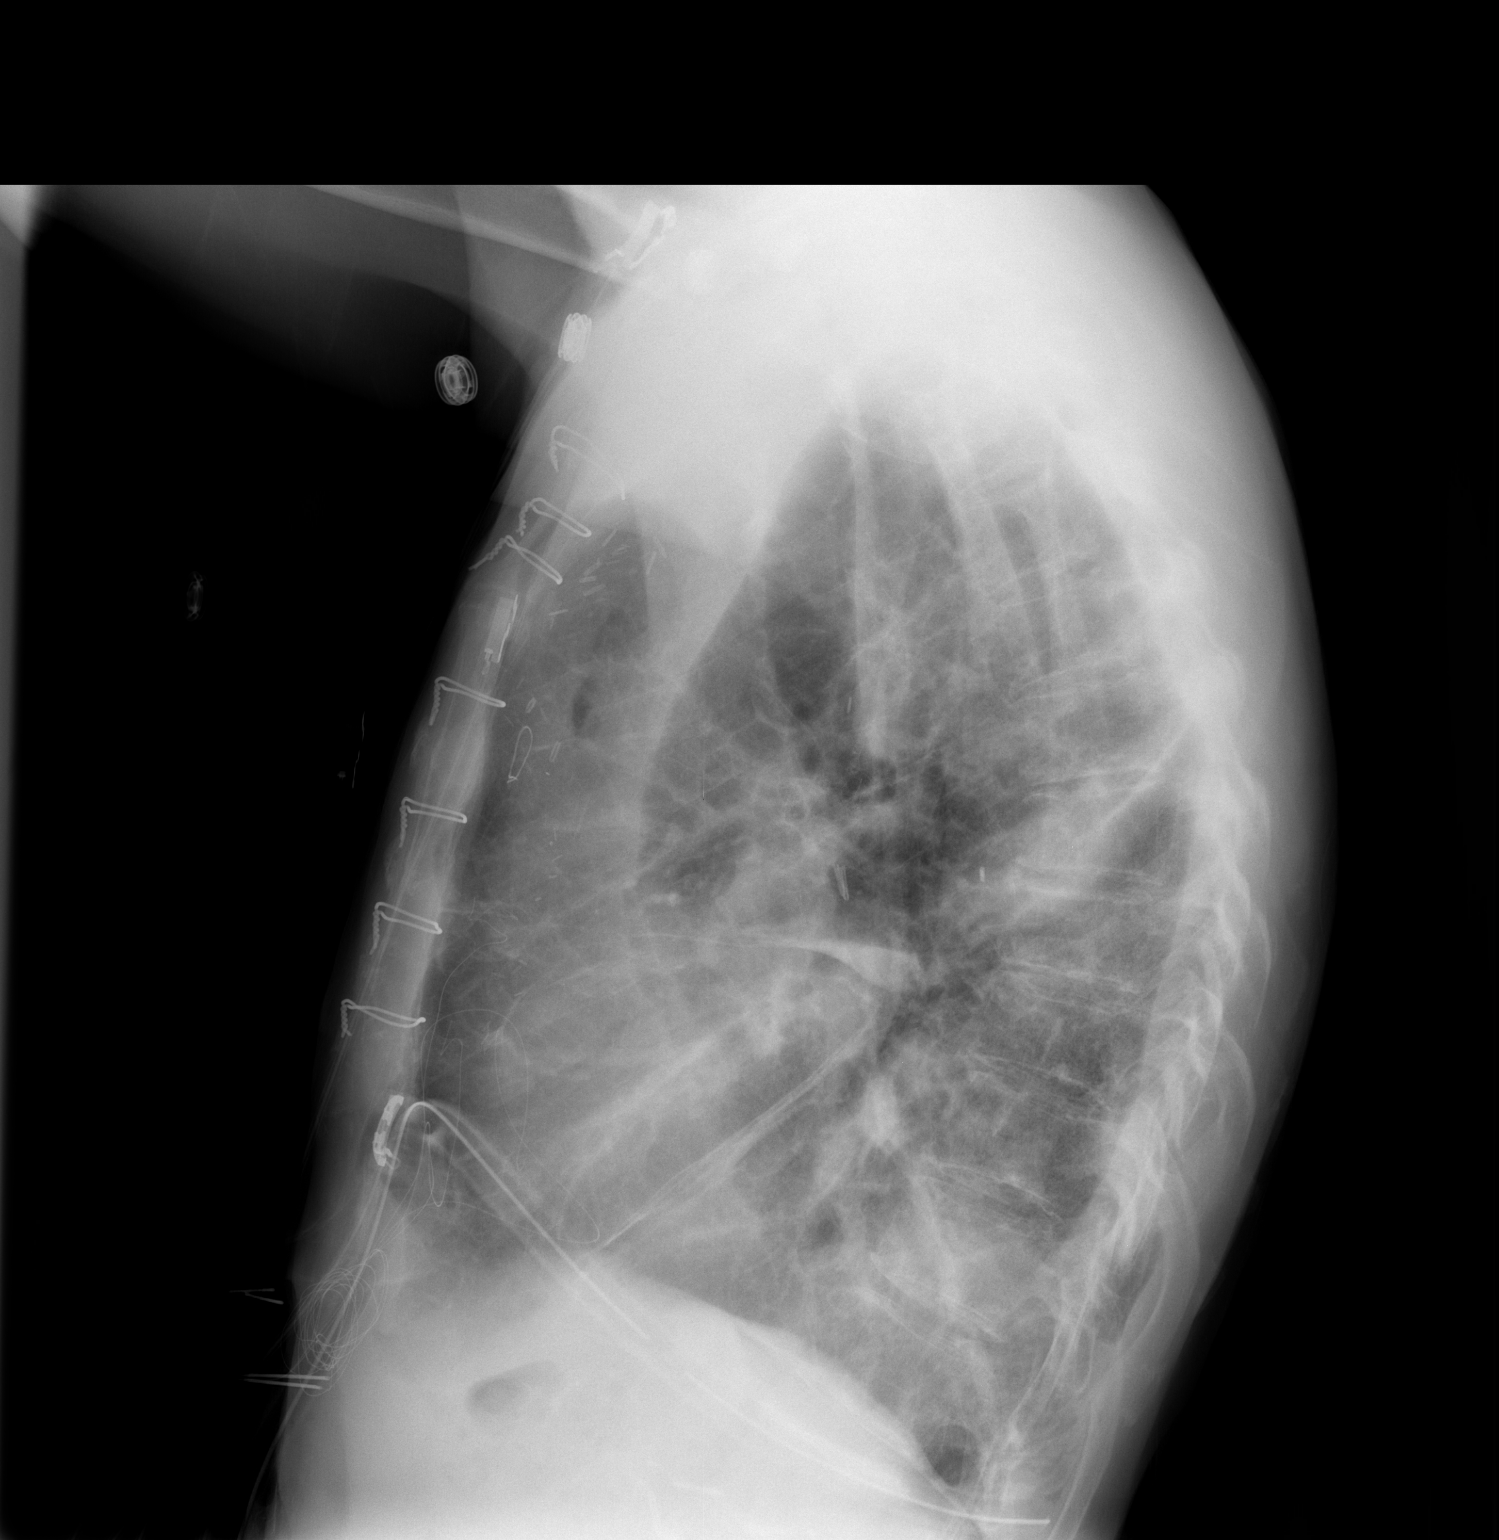

[2 of 2 positions shown; findings below may reference images not displayed]

FINDINGS: Stable cardiomediastinal silhouette.  Sternotomy wires
are noted as well as left apical surgical staples.  Mild left
pleural effusion is unchanged.  Right-sided chest tube is unchanged
in position.  There is noted minimal right apical pneumothorax.
Fluid is again noted within the lateral portion of the right minor
fissure.  Increased right lower lobe opacity is noted concerning
for worsening subsegmental atelectasis or pneumonia.  There appears
to be a small area of developing air fluid collection seen
laterally in right upper lobe which may represent loculated
effusion.
IMPRESSION: Minimal right apical pneumothorax is noted.  Worsening right lower
lobe opacity is noted concerning for pneumonia or subsegmental
atelectasis.  Stable mild left pleural effusion.  Development of
possible air fluid level seen laterally in right upper lobe which
may represent loculated effusion.

## 2013-10-28 IMAGING — CR DG CHEST 2V
2 series · 2 of 2 positions shown · non-contrast
Comparison: Chest x-ray 08/06/2013.

CLINICAL DATA: Shortness of breath.

CHEST - 2 VIEW

[w chest pa]
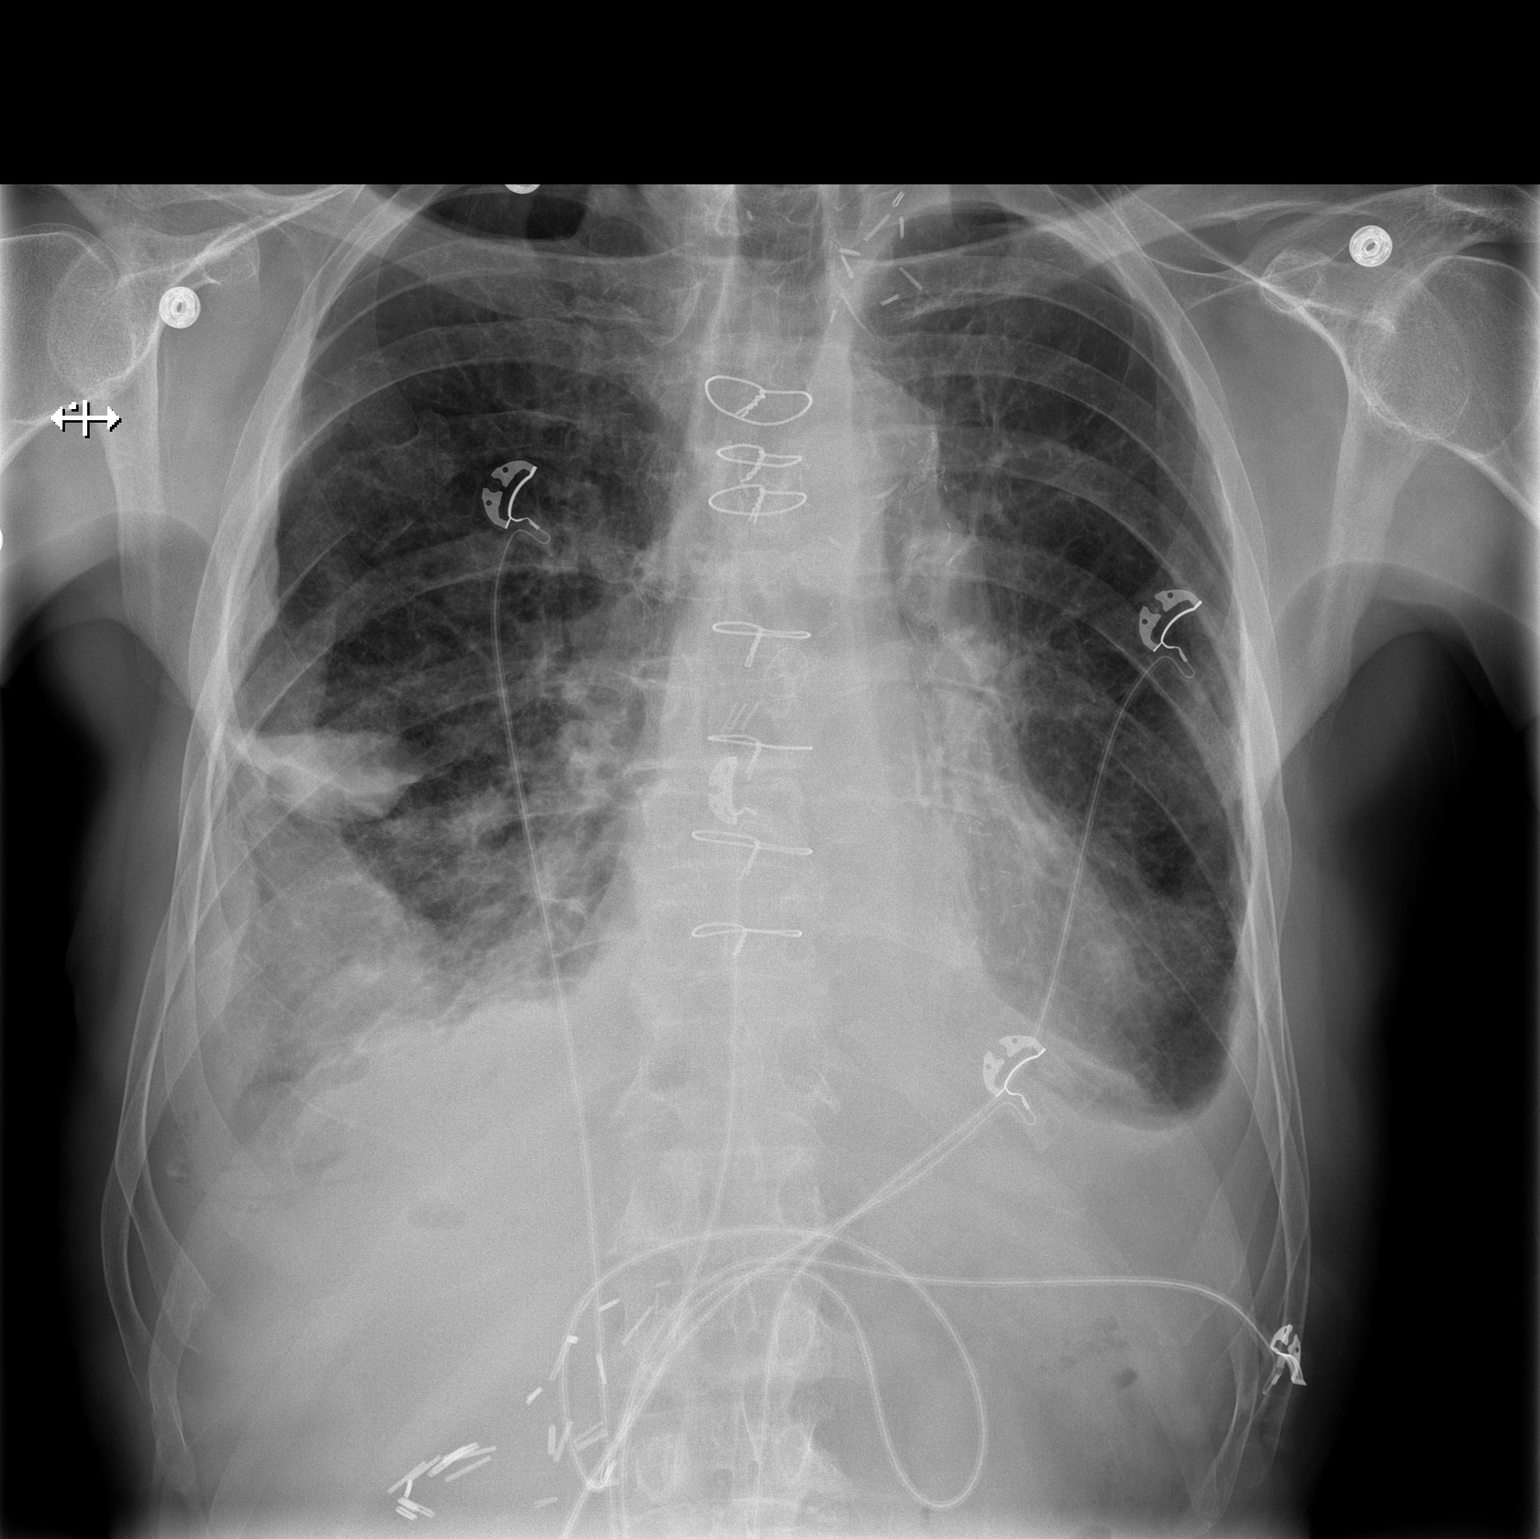

[w chest lat]
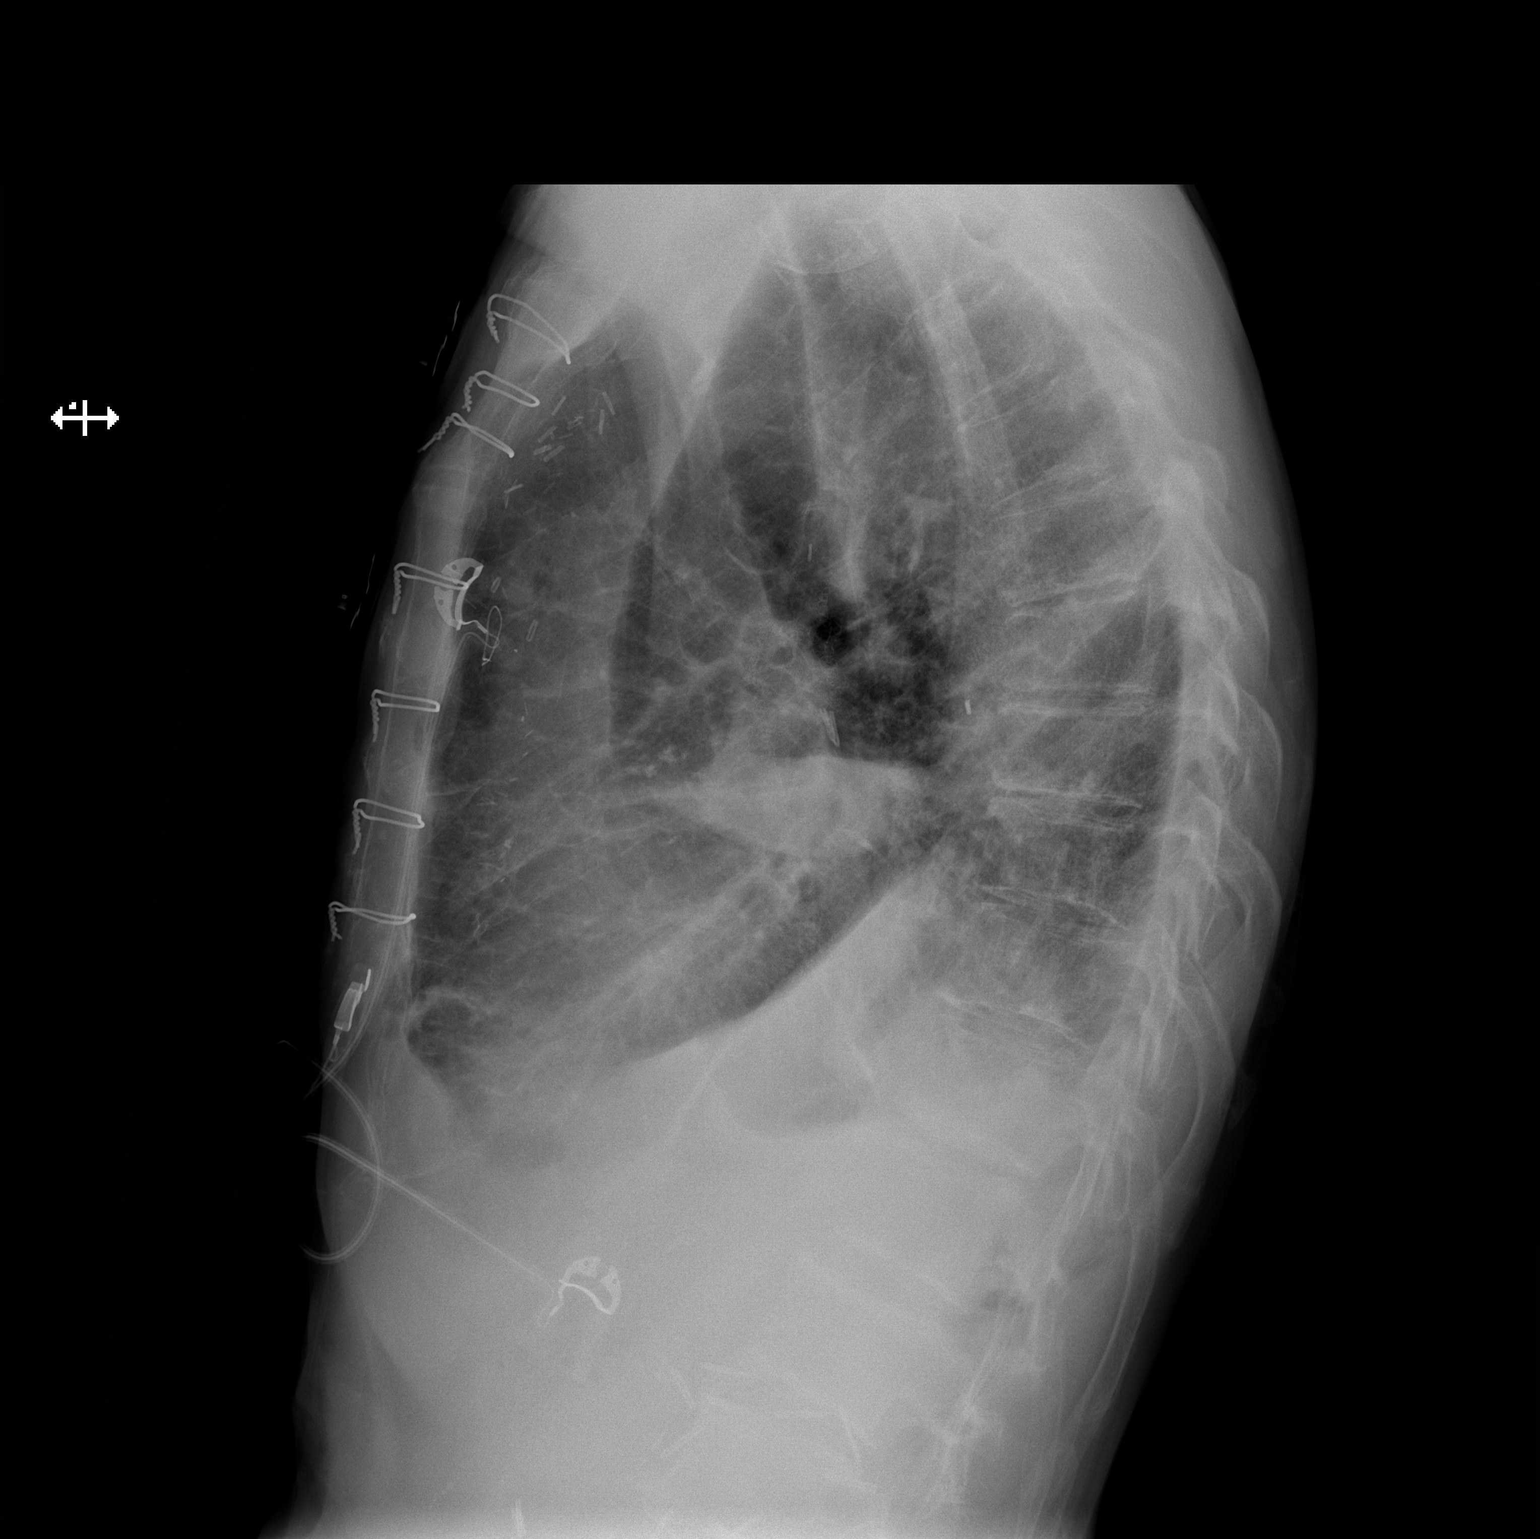

[2 of 2 positions shown; findings below may reference images not displayed]

FINDINGS: Previously noted right-sided chest tube has been removed.
No definite residual right apical pneumothorax is identified.
However, there is a moderate right-sided pleural effusion.  A small
to moderate left-sided pleural effusion is unchanged.  Extensive
atelectasis and/or consolidation is noted throughout the lung bases
bilaterally.  No evidence of pulmonary edema.  Heart size is
normal.  Upper mediastinal contours are within normal limits.
Atherosclerosis in the thoracic aorta.  Status post median
sternotomy for CABG.  Multiple surgical clips are noted over the
lower left cervical region and right upper quadrant of the abdomen.
IMPRESSION: 1.  Interval removal of right-sided chest tube with accumulation of
moderate right-sided pleural effusion.  Previously noted right
pneumothorax is no longer identified.
2.  Small left pleural effusion.
3.  Extensive bibasilar opacities compatible with worsening areas
of atelectasis and/or consolidation.
4.  Atherosclerosis.
5.  Postoperative changes, as above.

## 2013-10-29 IMAGING — CR DG CHEST 2V
2 series · 2 of 2 positions shown · non-contrast
Comparison: 08/10/2013

CLINICAL DATA: Right pleural effusion, recent thoracentesis

CHEST - 2 VIEW

[w chest pa]
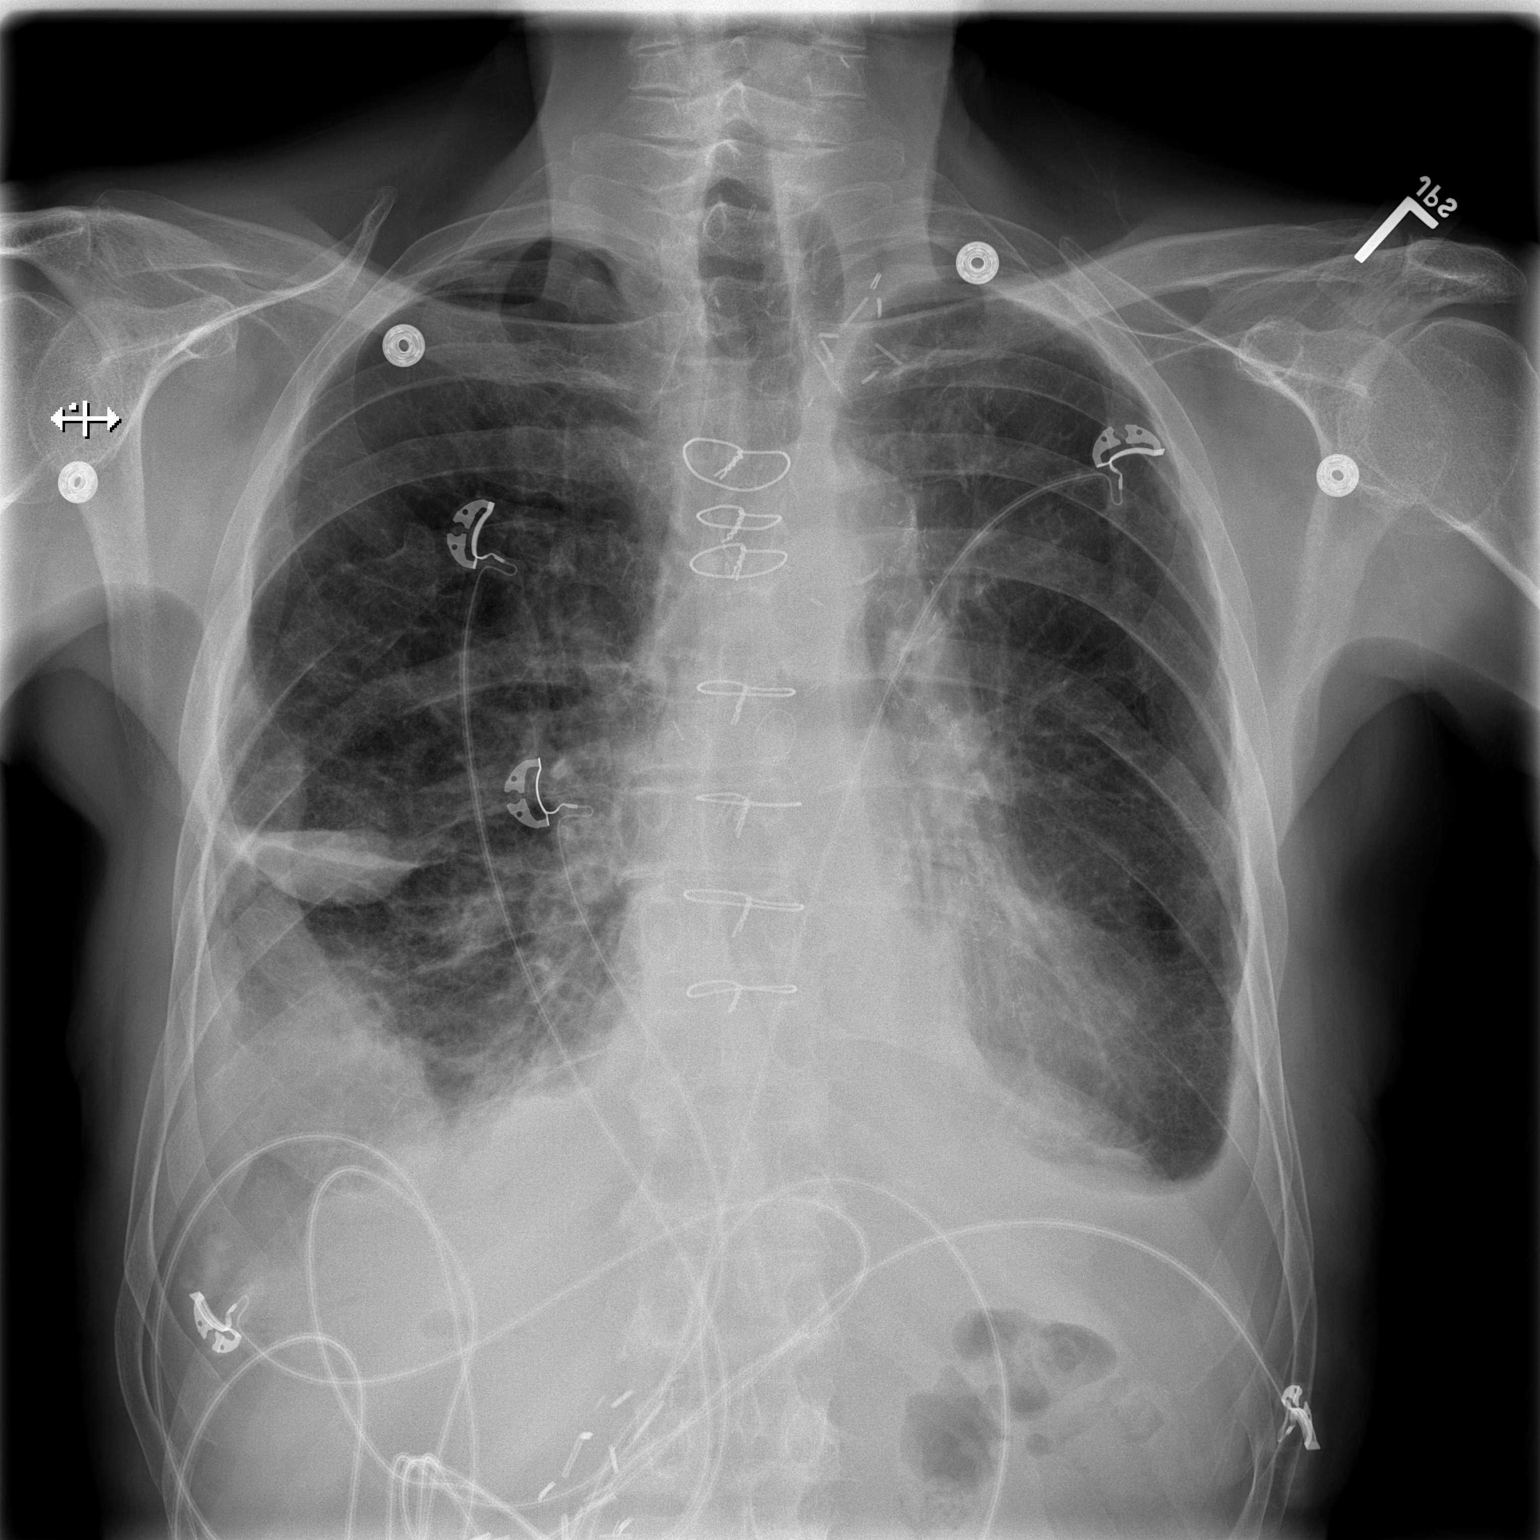

[w chest lat]
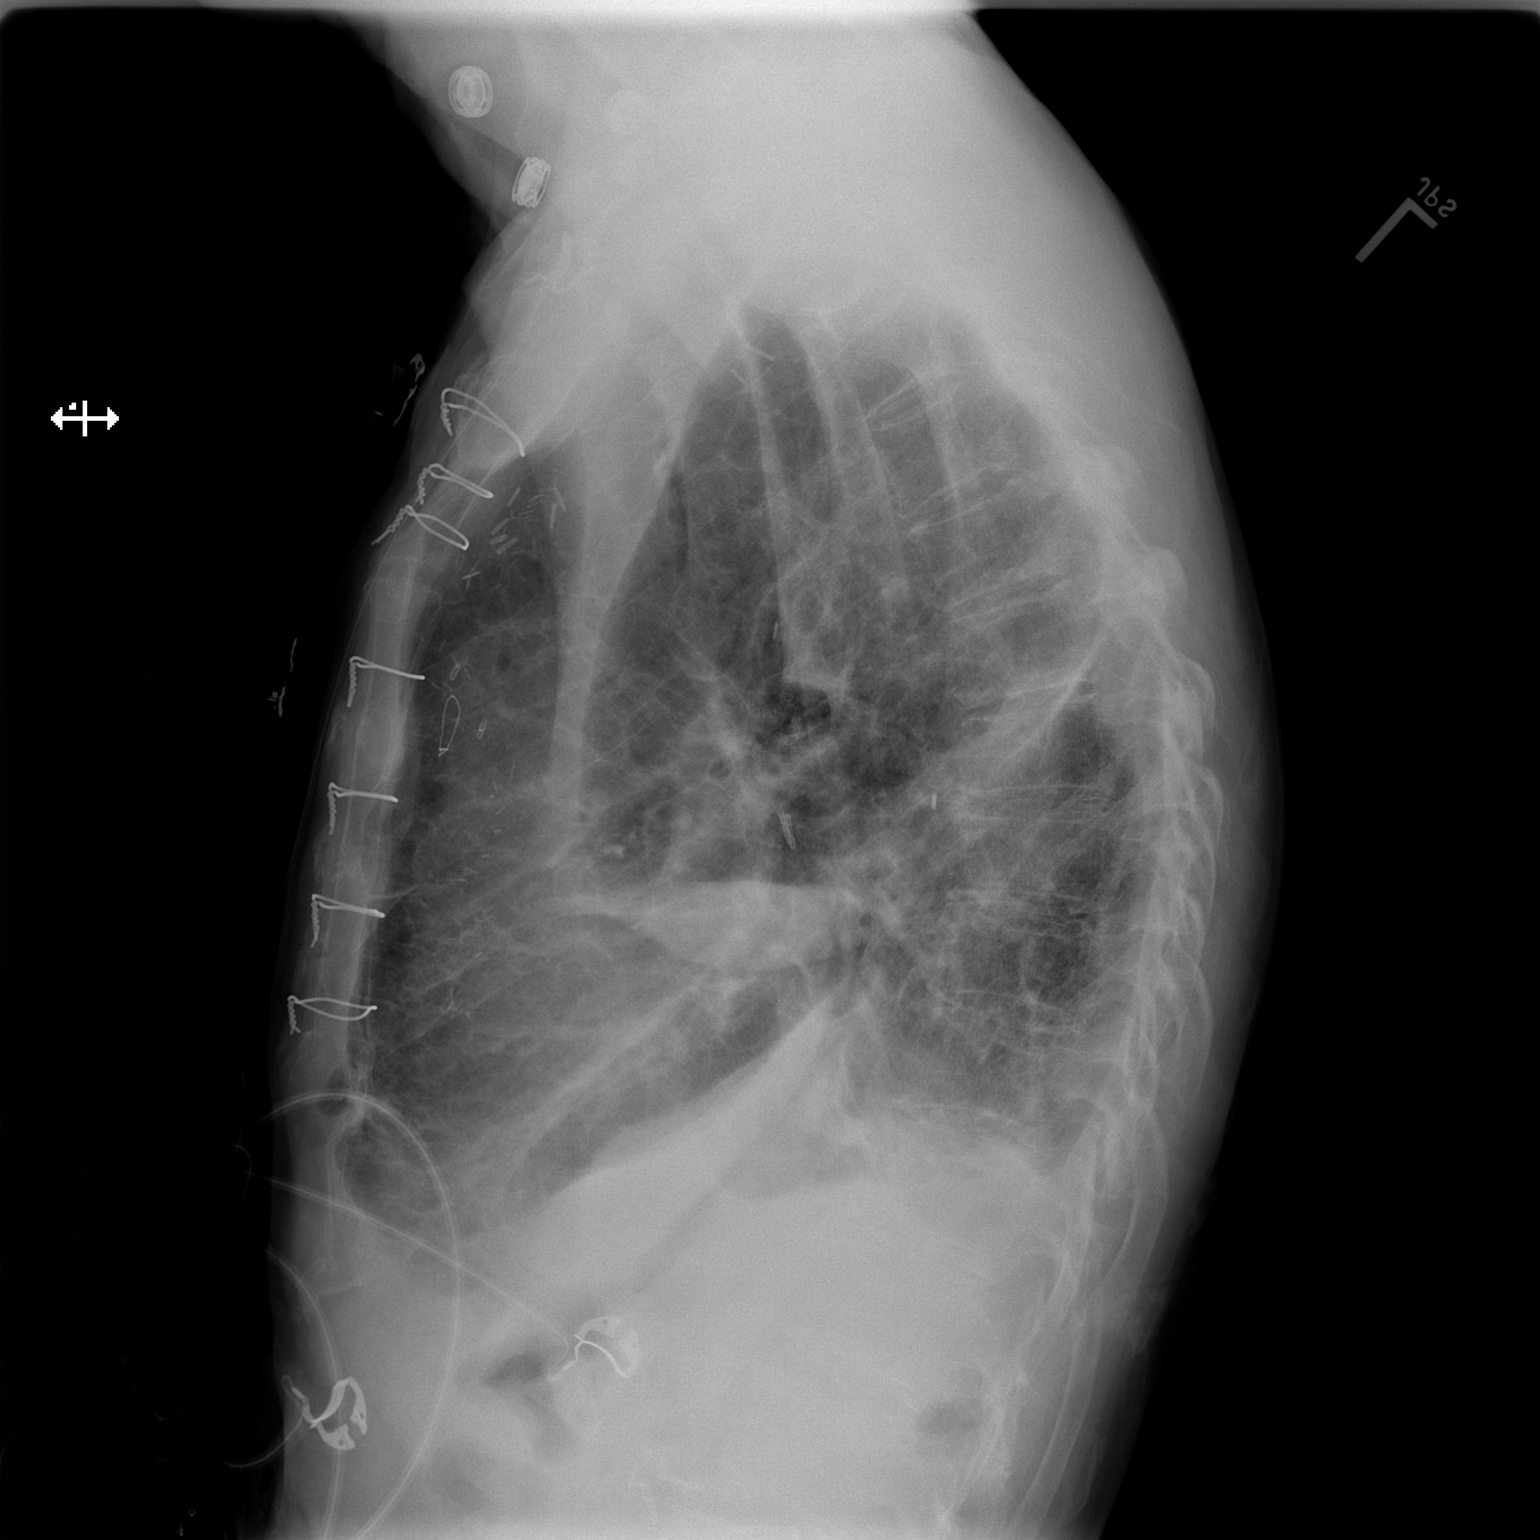

[2 of 2 positions shown; findings below may reference images not displayed]

FINDINGS: Possible tiny right apical pneumothorax, equivocal.

Associated moderate basilar pleural fluid component with additional
fluid along the right major fissure.  Small left pleural effusion.
These are unchanged.

No frank interstitial edema.  Bilateral lower lobe opacities,
likely atelectasis.

The heart is normal in size. Postsurgical changes related to prior
CABG.

Surgical clips in the left paratracheal region.

Degenerative changes of the visualized thoracolumbar spine.
IMPRESSION: Possible tiny right apical pneumothorax, equivocal.

Associated moderate right basilar pleural fluid component and a
small left pleural effusion, unchanged.

## 2013-11-06 IMAGING — CR DG CHEST 2V
2 series · 2 of 2 positions shown · non-contrast
Comparison: 08/11/2013; 08/10/2013; 08/06/2013; chest CT -
07/13/2013

CLINICAL DATA: Post CABG (07/30/2013) right-sided pleural effusion

CHEST - 2 VIEW

[w chest pa]
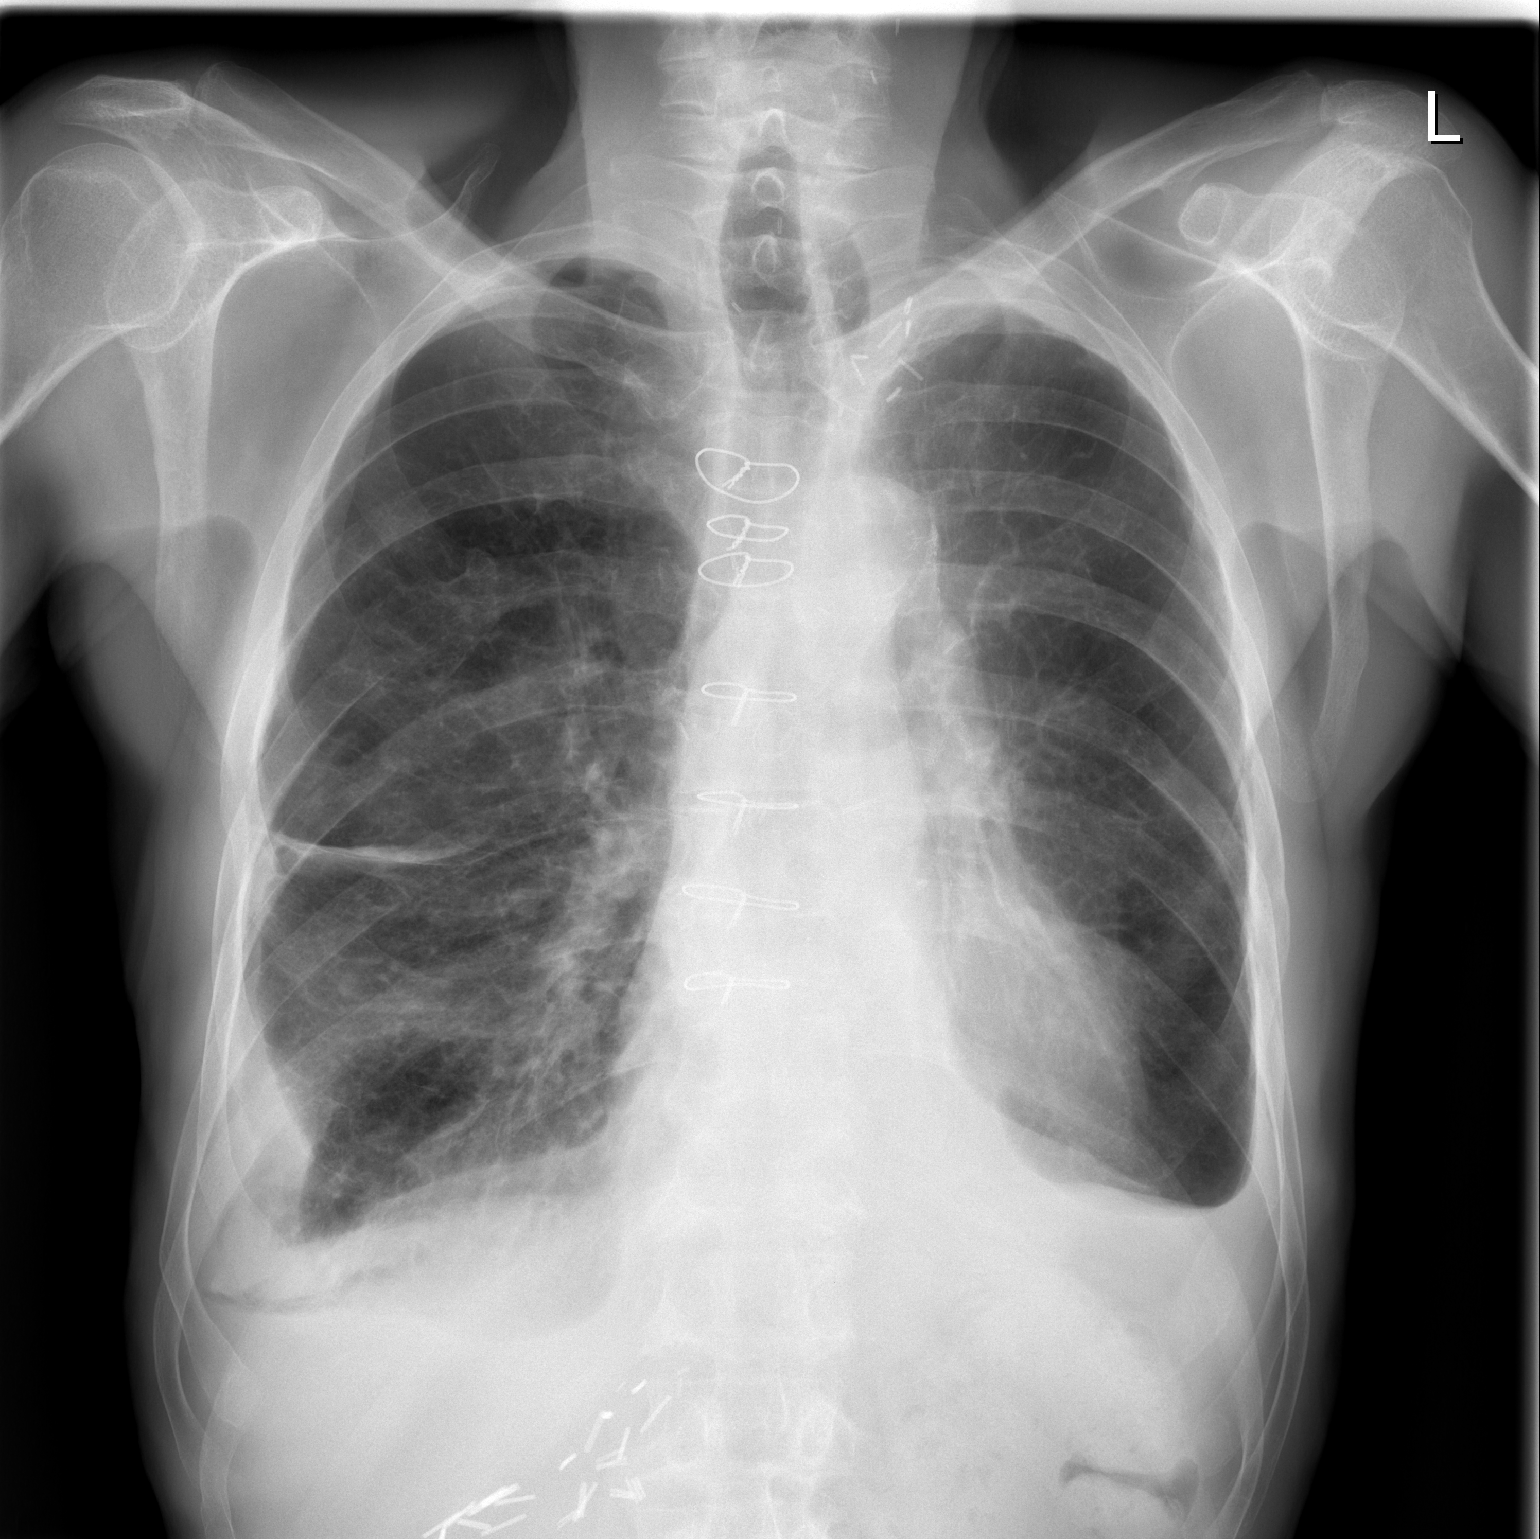

[w chest lat]
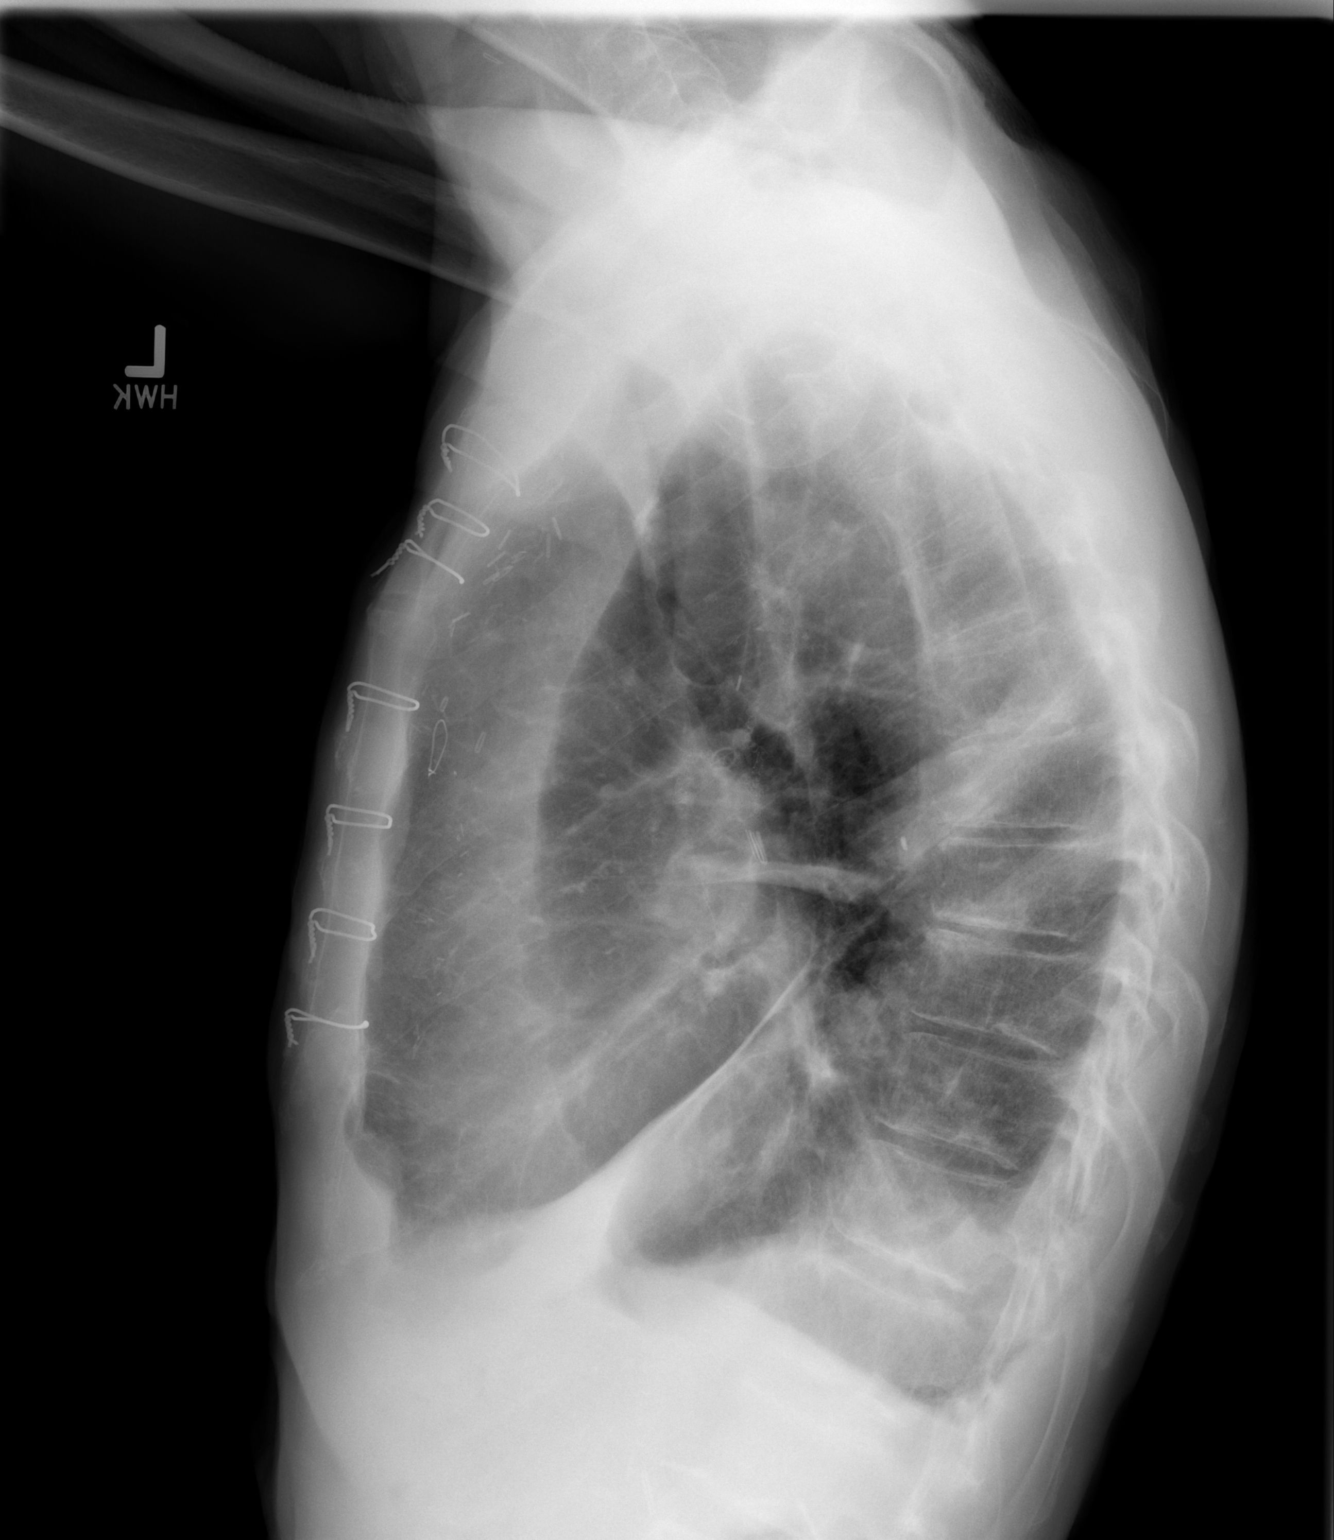

[2 of 2 positions shown; findings below may reference images not displayed]

FINDINGS: Grossly unchanged cardiac silhouette post median sternotomy. The
lungs remain hyperexpanded.  Improved aeration of the bilateral
lung bases with persistent small partially loculated bilateral
pleural effusions, right greater than left.  Small amount of fluid
is again noted layering within the right minor fissure.  No new
focal airspace opacities. There is incomplete aeration of the right
lower lobe without definite pneumothorax.  A small amount of fluid
is again noted tracking within the right minor fissure.  No
evidence of edema.  Unchanged bones including postsurgical change
of the posterior aspects of the right 6th and 7th ribs.  Right
upper abdominal surgical clips.
IMPRESSION: Improved aeration of the lungs with persistent small partially
loculated bilateral pleural effusions and residual bibasilar
atelectasis.  No definite pneumothorax or evidence of edema.

## 2013-11-20 IMAGING — CR DG CHEST 2V
2 series · 2 of 2 positions shown · non-contrast
Comparison: Chest x-ray of 08/19/2013

CLINICAL DATA: Post CABG, follow-up, former smoking history

EXAM:
CHEST  2 VIEW

[w chest pa]
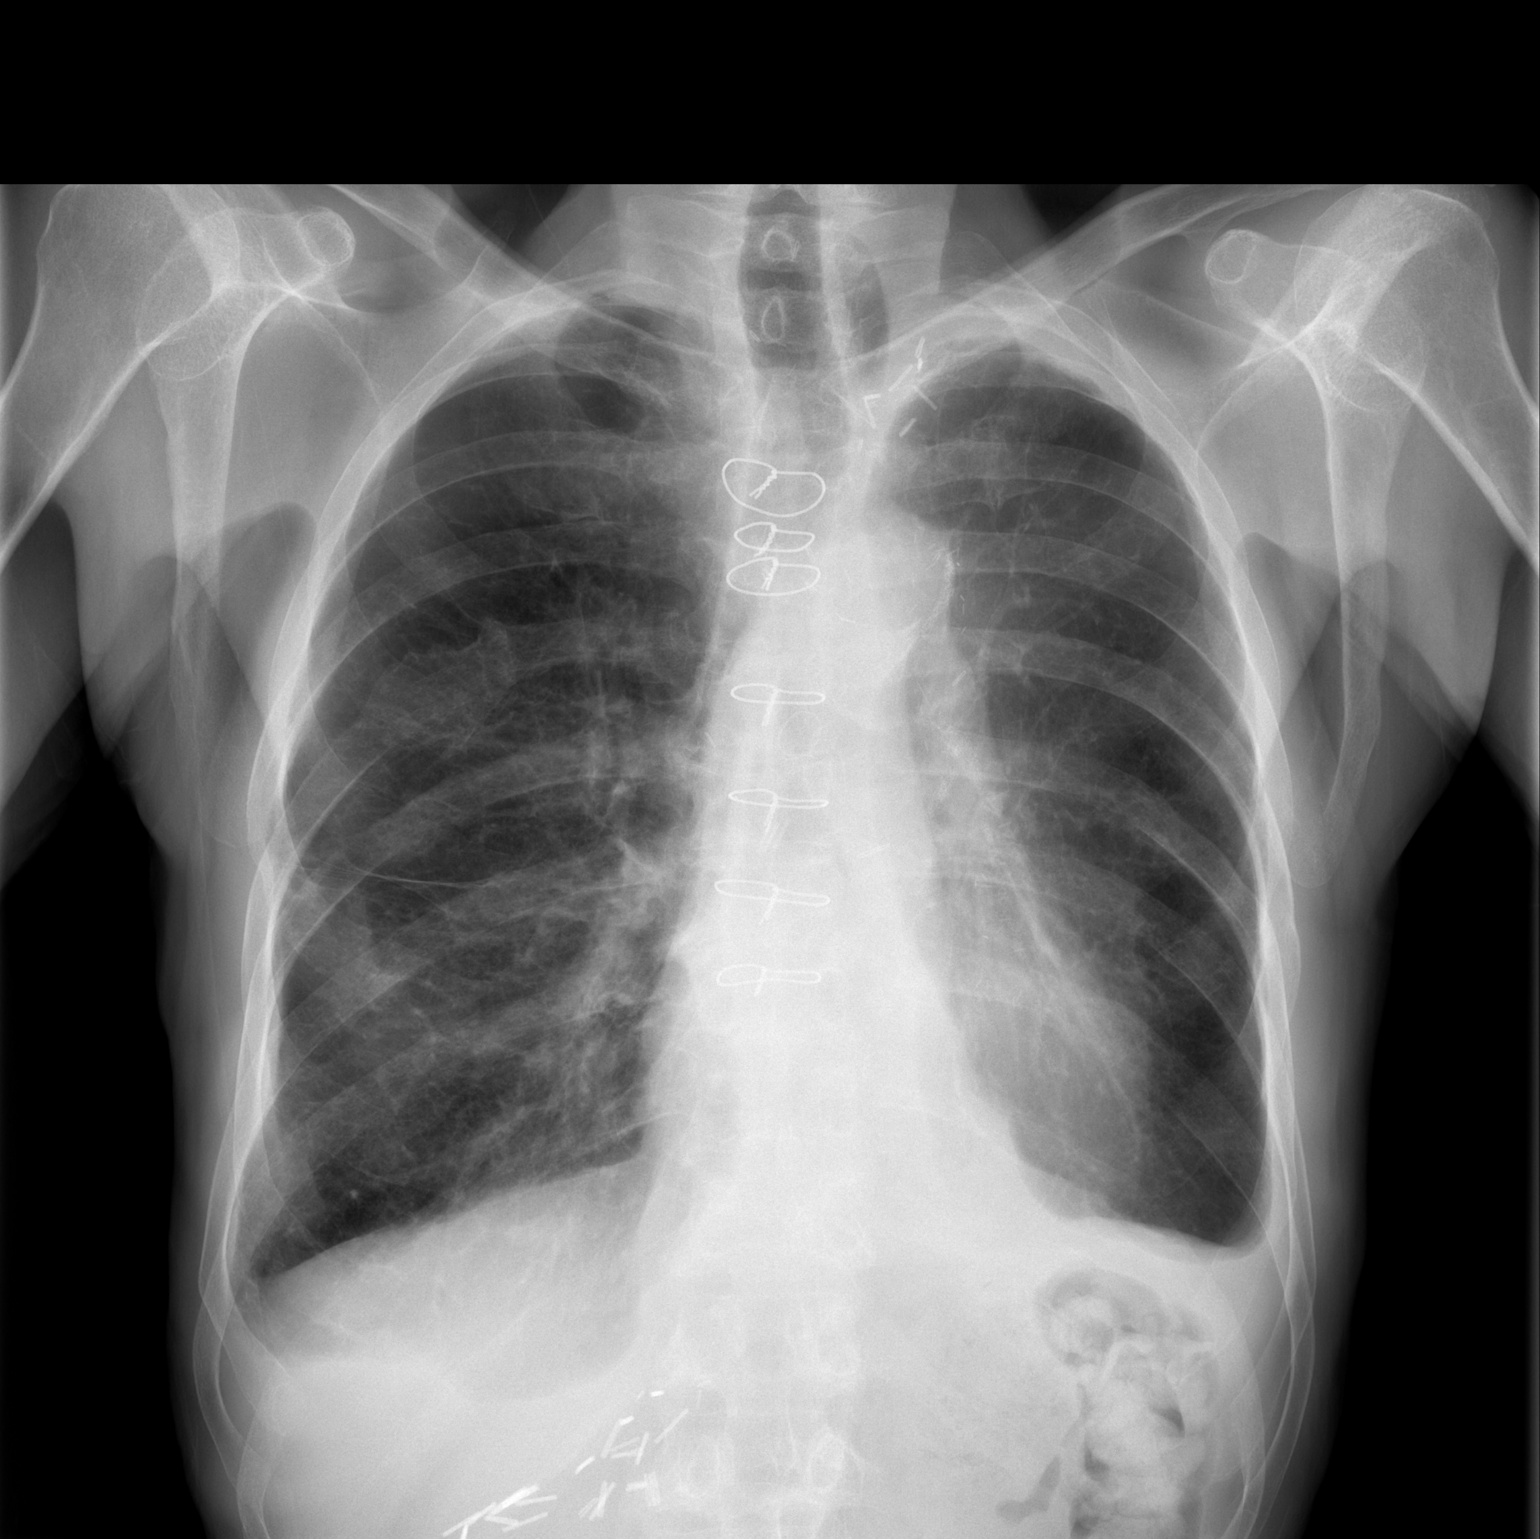

[w chest lat]
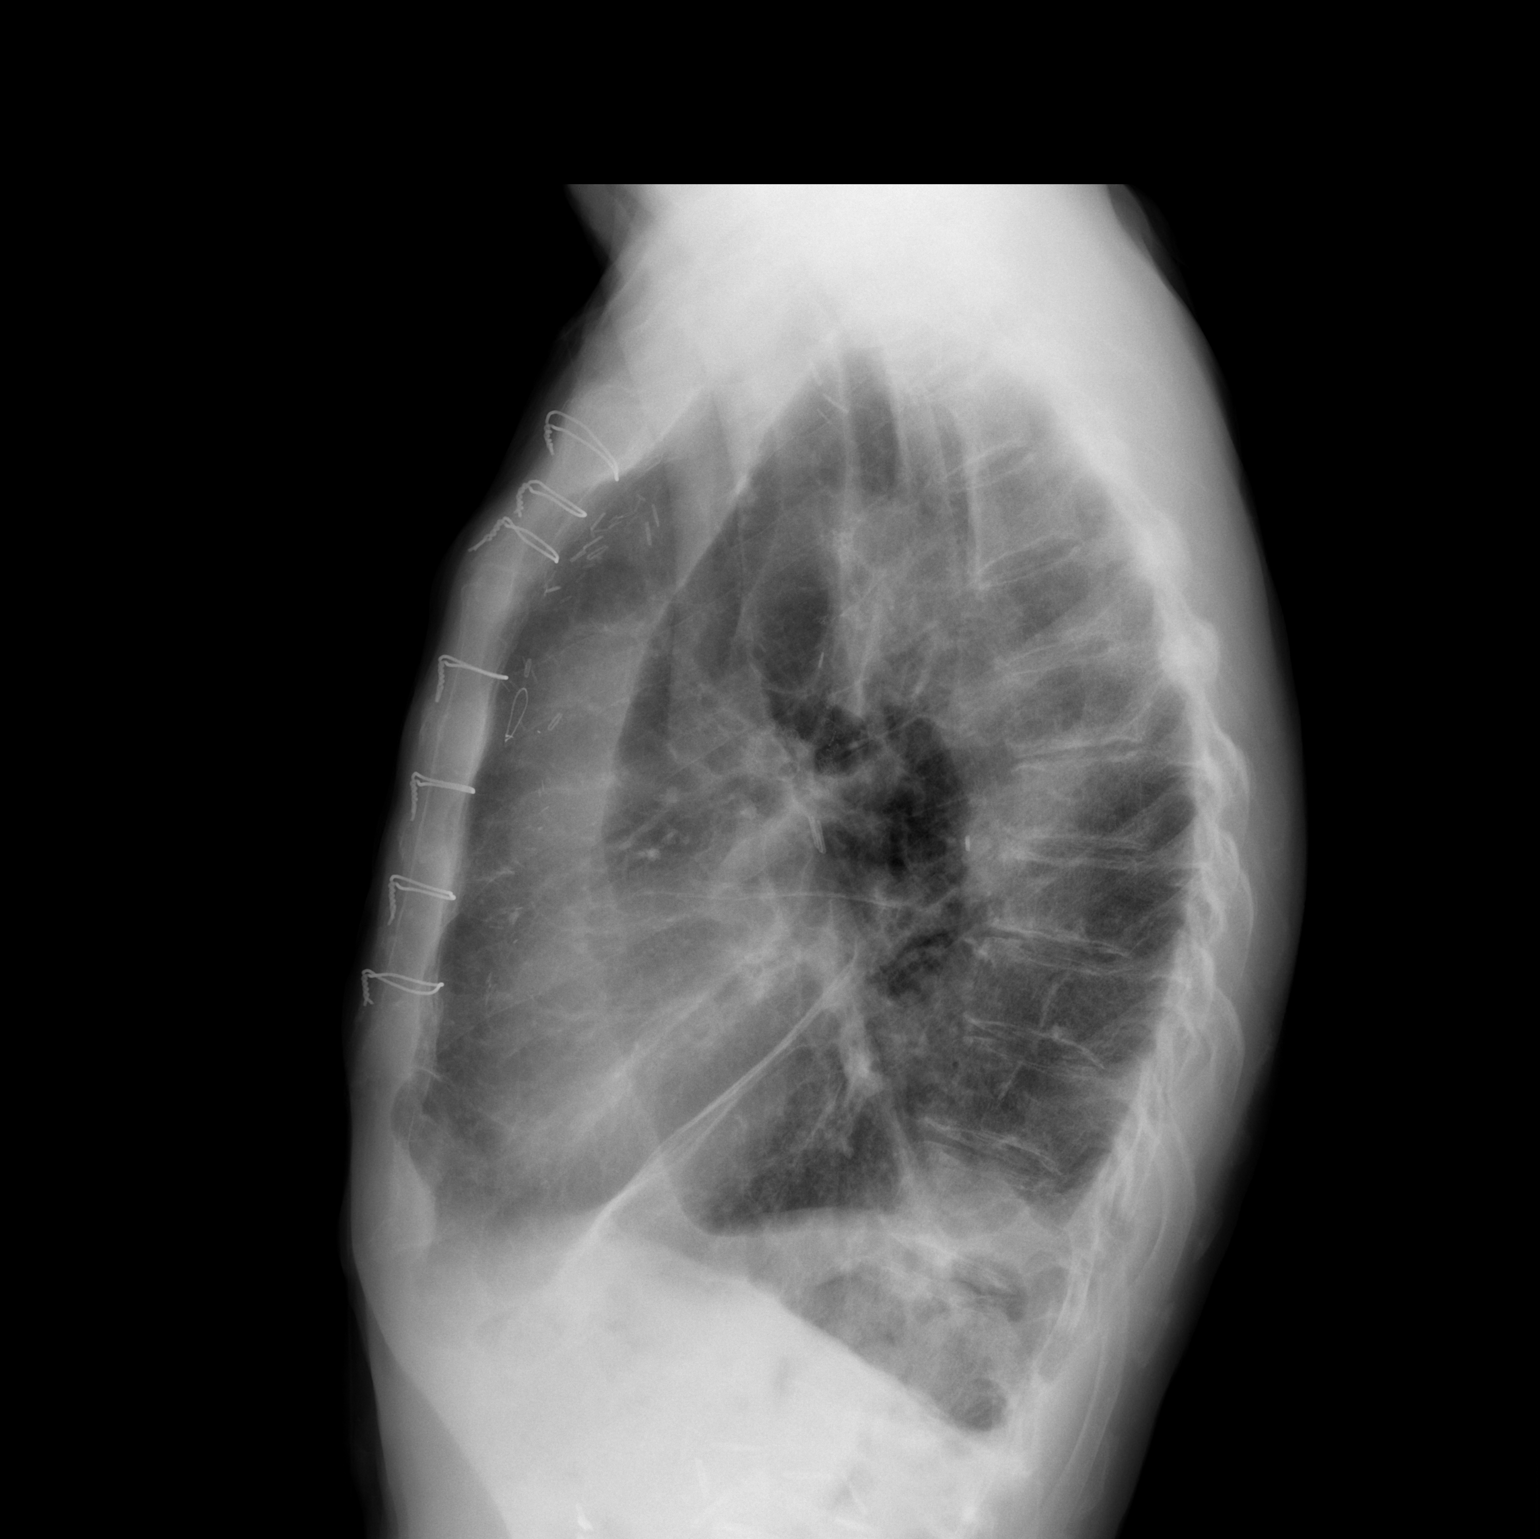

[2 of 2 positions shown; findings below may reference images not displayed]

FINDINGS: The lungs are better aerated. The small right pleural effusion has
decreased in volume with a little change in the small left effusion.
Mild cardiomegaly is stable. Median sternotomy sutures are intact.
No bony abnormality is seen.
IMPRESSION: Improved aeration. Small effusions remain bilaterally.

## 2013-12-04 ENCOUNTER — Ambulatory Visit (INDEPENDENT_AMBULATORY_CARE_PROVIDER_SITE_OTHER): Payer: Medicare Other | Admitting: Cardiology

## 2013-12-04 ENCOUNTER — Encounter: Payer: Self-pay | Admitting: Cardiology

## 2013-12-04 VITALS — BP 101/68 | HR 70 | Ht 70.0 in | Wt 136.0 lb

## 2013-12-04 DIAGNOSIS — I4891 Unspecified atrial fibrillation: Secondary | ICD-10-CM

## 2013-12-04 DIAGNOSIS — I25810 Atherosclerosis of coronary artery bypass graft(s) without angina pectoris: Secondary | ICD-10-CM

## 2013-12-04 DIAGNOSIS — E440 Moderate protein-calorie malnutrition: Secondary | ICD-10-CM

## 2013-12-04 DIAGNOSIS — E785 Hyperlipidemia, unspecified: Secondary | ICD-10-CM

## 2013-12-04 DIAGNOSIS — I2510 Atherosclerotic heart disease of native coronary artery without angina pectoris: Secondary | ICD-10-CM

## 2013-12-04 HISTORY — DX: Other postprocedural complications and disorders of the circulatory system, not elsewhere classified: I48.91

## 2013-12-04 MED ORDER — ASPIRIN EC 81 MG PO TBEC: 81.0000 mg | DELAYED_RELEASE_TABLET | Freq: Every day | ORAL | Status: AC

## 2013-12-04 MED ORDER — ATORVASTATIN CALCIUM 40 MG PO TABS: 40.0000 mg | ORAL_TABLET | Freq: Every day | ORAL | Status: DC

## 2013-12-04 NOTE — Patient Instructions (Signed)
Your physician has recommended you make the following change in your medication:  1. Decrease Atorvastatin to 40 MG daily. A new Rx has been sent to your pharmacy for you. 2. Decrease Aspirin to 81 MG daily. This is available OTC at your pharmacy.  Your physician wants you to follow-up in: 6 months with Dr. Algis Liming will receive a reminder letter in the mail two months in advance. If you don't receive a letter, please call our office to schedule the follow-up appointment.

## 2013-12-04 NOTE — Progress Notes (Signed)
Patient ID: Glen Green, male   DOB: October 31, 1941, 72 y.o.   MRN: [id number]      [address] [place], [place]  [address] Phone: [phone] Fax:  [phone]  Date:  12/04/2013   ID:  Glen Green, DOB [birth year], MRN [id number]  PCP:  Lorenda Peck, MD    History of Present Illness: Glen Green is a 72 y.o. male here with coronary artery disease status post bypass surgery here for followup. He had emergency CABG x2 on 07/31/13 secondary to severe multivessel coronary artery disease with occluded LAD, occluded circumflex and severely decreased left ventricular dysfunction with acute myocardial infarction and cardiogenic shock.  Prolonged recovery. Postop atrial fibrillation was noted then he converted to sinus rhythm on amiodarone.  Been feeling stronger everyday. No syncope, no bleeding, no CP. Having some issues turning his neck. Also noticed some stressful outbursts at home. His weight has increased.   Wt Readings from Last 3 Encounters:  12/04/13 136 lb (61.689 kg)  09/09/13 123 lb (55.792 kg)  09/02/13 122 lb (55.339 kg)     Past Medical History  Diagnosis Date  . Dyslipidemia   . History of esophageal cancer     s/p transhiatal esophagogastrectomy  . Hypothyroidism   . Cerebral aneurysm     TX. repair  1994  . History of lung cancer   . Pneumonia   . Cancer     Esophageal, adrenal gland, skin; lung  . Stroke 1995    denies residual  . 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 SBO (small bowel obstruction) 08/15/2012    History of small bowel obstruction (status post small bowel       resection, lysis of adhesions, incidental appendectomy, and repair       of left diaphragmatic hernia  . AAA (abdominal aortic aneurysm) 08/22/2012    3.9 cm by CT  - June 2013, stable     Current Outpatient Prescriptions  Medication Sig Dispense Refill  . aspirin EC 325 MG EC tablet Take 1 tablet (325 mg total) by mouth daily.  30 tablet  0   . atorvastatin (LIPITOR) 80 MG tablet Take 1 tablet (80 mg total) by mouth daily at 6 PM.  30 tablet  3  . guaiFENesin (MUCINEX) 600 MG 12 hr tablet Take 1,200 mg by mouth 2 (two) times daily as needed for congestion.      . traMADol (ULTRAM) 50 MG tablet As needed       No current facility-administered medications for this visit.    Allergies:   No Known Allergies  Social History:  The patient  reports that he quit smoking about 10 years ago. His smoking use included Cigarettes. He smoked 0.00 packs per day. He has never used smokeless tobacco. He reports that he does not drink alcohol or use illicit drugs.   ROS:  Please see the history of present illness.    All other systems reviewed and negative.   PHYSICAL EXAM: VS:  Ht 5\' 10"  (1.778 m)  Wt 136 lb (61.689 kg)  BMI 19.51 kg/m2 Thin,  in no acute distress HEENT: normal Neck: no JVD Cardiac:  normal S1, S2; RRR; no murmurBypass scar is healing well Lungs:  clear to auscultation bilaterally, no wheezing, rhonchi or rales, kyphosis noted Abd: soft, nontender, no hepatomegaly Ext: no edema Skin: warm and dry Neuro:  CNs 2-12 intact, no focal abnormalities noted  EKG:  Normal sinus rhythm, no longer A. fib  ASSESSMENT AND PLAN:  72 year old male with coronary artery disease status post bypass emergently x2, brief postoperative atrial fibrillation, malnutrition, hyperlipidemia.  1. Coronary artery disease  - Overall doing very well, no complaints, no exertional anginal symptoms, continue with rehabilitation efforts. Since it is greater than 3 months postoperative, he may resume golfing. Take it easy at first. Also with stressful outbursts, take it slow.  2. Atrial fibrillation-postoperatively  - Currently in normal sinus rhythm  - I am going to discontinue his amiodarone. Currently with first degree AV block, normal rate. I am not utilizing beta blocker.  3. Hyperlipidemia  - Continue with statin, however I will change to 40  mg once a day.  4. Malnutrition - Continue to increase weight. Ensure would be helpful.  Lonna Duval, MARK  12/04/2013 9:05 AM

## 2014-06-08 ENCOUNTER — Ambulatory Visit: Payer: Medicare Other | Admitting: Cardiology

## 2014-06-16 ENCOUNTER — Encounter: Payer: Self-pay | Admitting: *Deleted

## 2014-06-16 ENCOUNTER — Ambulatory Visit (INDEPENDENT_AMBULATORY_CARE_PROVIDER_SITE_OTHER): Payer: Medicare Other | Admitting: Cardiology

## 2014-06-16 ENCOUNTER — Encounter: Payer: Self-pay | Admitting: Cardiology

## 2014-06-16 VITALS — BP 100/70 | HR 90 | Ht 70.0 in | Wt 135.0 lb

## 2014-06-16 DIAGNOSIS — I422 Other hypertrophic cardiomyopathy: Secondary | ICD-10-CM

## 2014-06-16 DIAGNOSIS — I2589 Other forms of chronic ischemic heart disease: Secondary | ICD-10-CM

## 2014-06-16 DIAGNOSIS — I2510 Atherosclerotic heart disease of native coronary artery without angina pectoris: Secondary | ICD-10-CM

## 2014-06-16 DIAGNOSIS — I255 Ischemic cardiomyopathy: Secondary | ICD-10-CM

## 2014-06-16 DIAGNOSIS — E7800 Pure hypercholesterolemia, unspecified: Secondary | ICD-10-CM

## 2014-06-16 DIAGNOSIS — I10 Essential (primary) hypertension: Secondary | ICD-10-CM

## 2014-06-16 NOTE — Patient Instructions (Signed)
The current medical regimen is effective;  continue present plan and medications.  Your physician has requested that you have an echocardiogram. Echocardiography is a painless test that uses sound waves to create images of your heart. It provides your doctor with information about the size and shape of your heart and how well your heart's chambers and valves are working. This procedure takes approximately one hour. There are no restrictions for this procedure.  Follow up in 6 months with Dr Marlou Porch.  You will receive a letter in the mail 2 months before you are due.  Please call us when you receive this letter to schedule your follow up appointment.

## 2014-06-16 NOTE — Progress Notes (Signed)
Patient ID: Glen Green, male   DOB: 1941/02/21, 73 y.o.   MRN: [id number]      [address] [place], [place]  [address] Phone: [phone] Fax:  [phone]  Date:  06/16/2014   ID:  Glen Green, DOB [birth year], MRN [id number]  PCP:  Myriam Jacobson, MD    History of Present Illness: Glen Green is a 73 y.o. male here with coronary artery disease status post bypass surgery here for followup. He had emergency CABG x2 on 07/31/13 secondary to severe multivessel coronary artery disease with occluded LAD, occluded circumflex and severely decreased left ventricular dysfunction with acute myocardial infarction and cardiogenic shock.  Prolonged recovery. Postop atrial fibrillation was noted then he converted to sinus rhythm on amiodarone.  Been feeling stronger everyday. No syncope, no bleeding, no CP. Having some issues turning his neck. Also noticed some stressful outbursts at home. His weight has increased.  His ejection fraction was 25%   Wt Readings from Last 3 Encounters:  06/16/14 135 lb (61.236 kg)  12/04/13 136 lb (61.689 kg)  09/09/13 123 lb (55.792 kg)     Past Medical History  Diagnosis Date  . Dyslipidemia   . History of esophageal cancer     s/p transhiatal esophagogastrectomy  . Hypothyroidism   . Cerebral aneurysm     TX. repair  1994  . History of lung cancer   . Pneumonia   . Cancer     Esophageal, adrenal gland, skin; lung  . Stroke 1995    denies residual  . 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 SBO (small bowel obstruction) 08/15/2012    History of small bowel obstruction (status post small bowel       resection, lysis of adhesions, incidental appendectomy, and repair       of left diaphragmatic hernia  . AAA (abdominal aortic aneurysm) 08/22/2012    3.9 cm by CT  - June 2013, stable   . Postoperative atrial fibrillation 12/04/2013    Short course of amiodarone, resolved. Postop bypass    Current Outpatient  Prescriptions  Medication Sig Dispense Refill  . aspirin 81 MG tablet Take 1 tablet (81 mg total) by mouth daily.  30 tablet  0  . atorvastatin (LIPITOR) 40 MG tablet Take 1 tablet (40 mg total) by mouth daily at 6 PM.  30 tablet  5  . guaiFENesin (MUCINEX) 600 MG 12 hr tablet Take 1,200 mg by mouth 2 (two) times daily as needed for congestion.      . traMADol (ULTRAM) 50 MG tablet As needed       No current facility-administered medications for this visit.    Allergies:   No Known Allergies  Social History:  The patient  reports that he quit smoking about 11 years ago. His smoking use included Cigarettes. He smoked 0.00 packs per day. He has never used smokeless tobacco. He reports that he does not drink alcohol or use illicit drugs.   ROS:  Please see the history of present illness.    All other systems reviewed and negative.   PHYSICAL EXAM: VS:  BP 100/70  Pulse 90  Ht 5\' 10"  (1.778 m)  Wt 135 lb (61.236 kg)  BMI 19.37 kg/m2 Thin,  in no acute distress HEENT: normal Neck: no JVD Cardiac:  normal S1, S2; RRR; no murmurBypass scar is healing well Lungs:  clear to auscultation bilaterally, no wheezing, rhonchi or rales,  kyphosis noted Abd: soft, nontender, no hepatomegaly Ext: no edema Skin: warm and dry Neuro:  CNs 2-12 intact, no focal abnormalities noted  EKG:  Normal sinus rhythm, no longer A. fib  ASSESSMENT AND PLAN:  73 year old male with coronary artery disease status post bypass emergently x2, brief postoperative atrial fibrillation, malnutrition, hyperlipidemia, ischemic cardiomyopathy ejection fraction 25%.  1. Coronary artery disease  - Overall doing very well, no complaints, no exertional anginal symptoms, Playing golf. Walking frequently. Post bypass.   2. Atrial fibrillation-postoperatively  - Currently in normal sinus rhythm  - I have discontinued his amiodarone. Currently with first degree AV block, normal rate. I am not utilizing beta blocker.  3.  Hyperlipidemia  - Continue with statin,40 mg once a day. He is going to have this checked at his physical  4. Malnutrition - Continue to increase weight. Ensure would be helpful.  5. Ischemic cardiomyopathy -Ejection fraction was 25% on 07/31/13 in the setting of his cardiogenic shock. I will repeat an echocardiogram post revascularization. Unable to utilize beta blocker or ACE inhibitor because of hypotension.  [name], [place]  06/16/2014 9:52 AM

## 2014-06-22 ENCOUNTER — Other Ambulatory Visit: Payer: Self-pay

## 2014-06-22 MED ORDER — ATORVASTATIN CALCIUM 40 MG PO TABS: 40.0000 mg | ORAL_TABLET | Freq: Every day | ORAL | Status: DC

## 2014-06-29 ENCOUNTER — Ambulatory Visit (HOSPITAL_COMMUNITY): Payer: Medicare Other | Attending: Cardiovascular Disease | Admitting: Cardiology

## 2014-06-29 DIAGNOSIS — I422 Other hypertrophic cardiomyopathy: Secondary | ICD-10-CM

## 2014-06-29 DIAGNOSIS — E785 Hyperlipidemia, unspecified: Secondary | ICD-10-CM | POA: Insufficient documentation

## 2014-06-29 DIAGNOSIS — I428 Other cardiomyopathies: Secondary | ICD-10-CM | POA: Insufficient documentation

## 2014-06-29 DIAGNOSIS — I517 Cardiomegaly: Secondary | ICD-10-CM | POA: Insufficient documentation

## 2014-06-29 DIAGNOSIS — I2510 Atherosclerotic heart disease of native coronary artery without angina pectoris: Secondary | ICD-10-CM | POA: Insufficient documentation

## 2014-06-29 NOTE — Progress Notes (Signed)
Echo performed. 

## 2014-06-30 ENCOUNTER — Other Ambulatory Visit: Payer: Self-pay

## 2014-06-30 DIAGNOSIS — C159 Malignant neoplasm of esophagus, unspecified: Secondary | ICD-10-CM

## 2014-06-30 DIAGNOSIS — Z85118 Personal history of other malignant neoplasm of bronchus and lung: Secondary | ICD-10-CM

## 2014-06-30 DIAGNOSIS — I714 Abdominal aortic aneurysm, without rupture, unspecified: Secondary | ICD-10-CM

## 2014-07-02 ENCOUNTER — Other Ambulatory Visit: Payer: Self-pay

## 2014-07-02 DIAGNOSIS — Z859 Personal history of malignant neoplasm, unspecified: Secondary | ICD-10-CM

## 2014-07-07 ENCOUNTER — Telehealth: Payer: Self-pay | Admitting: Cardiology

## 2014-07-07 NOTE — Telephone Encounter (Signed)
°  Patient would like to speak with you regarding procedure. They do not understand. Please call and advise.

## 2014-07-07 NOTE — Telephone Encounter (Signed)
Reviewed echo results again with wife who states pt has been very concerned and wants to make sure he is ok to wait to be seen.  Advised echo results are improved from last year, Dr Marlou Porch wants to see the pt to discuss the possibility  of needing an ICD and that it is OK for him to wait for his appt as scheduled.  Wife states understanding.

## 2014-07-19 ENCOUNTER — Ambulatory Visit (INDEPENDENT_AMBULATORY_CARE_PROVIDER_SITE_OTHER): Payer: Medicare Other | Admitting: Cardiology

## 2014-07-19 ENCOUNTER — Encounter: Payer: Self-pay | Admitting: Cardiology

## 2014-07-19 VITALS — BP 102/64 | HR 74 | Ht 71.0 in | Wt 136.0 lb

## 2014-07-19 DIAGNOSIS — I2510 Atherosclerotic heart disease of native coronary artery without angina pectoris: Secondary | ICD-10-CM

## 2014-07-19 DIAGNOSIS — I2589 Other forms of chronic ischemic heart disease: Secondary | ICD-10-CM

## 2014-07-19 DIAGNOSIS — I255 Ischemic cardiomyopathy: Secondary | ICD-10-CM

## 2014-07-19 DIAGNOSIS — I252 Old myocardial infarction: Secondary | ICD-10-CM

## 2014-07-19 NOTE — Progress Notes (Signed)
Patient ID: Glen Green, male   DOB: [birth year], 73 y.o.   MRN: [id number]      [address], [address] [place], [place]  [address] Phone: [phone] Fax:  [phone]  Date:  07/19/2014   ID:  Glen Green, DOB 1940/12/16, MRN [id number]  PCP:  Donnie Coffin, MD    History of Present Illness: Glen Green is a 73 y.o. male here with coronary artery disease status post bypass surgery, ischemic cardiomyopathy EF 30-35% here for followup. He had emergency CABG x2 on 07/31/13 secondary to severe multivessel coronary artery disease with occluded LAD, occluded circumflex and severely decreased left ventricular dysfunction with acute myocardial infarction and cardiogenic shock.  Prolonged recovery. Postop atrial fibrillation was noted then he converted to sinus rhythm on amiodarone (no longer taking).  No syncope, no bleeding, no CP. Having some issues turning his neck. Also noticed some stressful outbursts at home. His weight has increased.  His ejection fraction was 25% currently 30-35%.   2 cancer 2004 perspective, chemotherapy, right and left Port-A-Cath placement infection. Lung carcinoma approximately 2 years ago, Dr. Servando Snare. Approximately 4 cm abdominal aortic aneurysm, Dr. Trula Slade.   Wt Readings from Last 3 Encounters:  07/19/14 136 lb (61.689 kg)  06/16/14 135 lb (61.236 kg)  12/04/13 136 lb (61.689 kg)     Past Medical History  Diagnosis Date  . Dyslipidemia   . History of esophageal cancer     s/p transhiatal esophagogastrectomy  . Hypothyroidism   . Cerebral aneurysm     TX. repair  1994  . History of lung cancer   . Pneumonia   . Cancer     Esophageal, adrenal gland, skin; lung  . Stroke 1995    denies residual  . 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 SBO (small bowel obstruction) 08/15/2012    History of small bowel obstruction (status post small bowel       resection, lysis of adhesions, incidental appendectomy, and repair       of  left diaphragmatic hernia  . AAA (abdominal aortic aneurysm) 08/22/2012    3.9 cm by CT  - June 2013, stable   . Postoperative atrial fibrillation 12/04/2013    Short course of amiodarone, resolved. Postop bypass    Current Outpatient Prescriptions  Medication Sig Dispense Refill  . aspirin 81 MG tablet Take 1 tablet (81 mg total) by mouth daily.  30 tablet  0  . atorvastatin (LIPITOR) 40 MG tablet Take 1 tablet (40 mg total) by mouth daily at 6 PM.  30 tablet  5  . guaiFENesin (MUCINEX) 600 MG 12 hr tablet Take 1,200 mg by mouth 2 (two) times daily as needed for congestion.      Marland Kitchen Phenyleph-CPM-DM-APAP (ALKA-SELTZER PLUS COLD & COUGH) 04-10-09-325 MG CAPS Take by mouth.       No current facility-administered medications for this visit.    Allergies:   No Known Allergies  Social History:  The patient  reports that he quit smoking about 11 years ago. His smoking use included Cigarettes. He smoked 0.00 packs per day. He has never used smokeless tobacco. He reports that he does not drink alcohol or use illicit drugs.   ROS:  Please see the history of present illness.   Denies any bleeding, syncope, orthopnea, PND All other systems reviewed and negative.   PHYSICAL EXAM: VS:  BP 102/64  Pulse 74  Ht 5\' 11"  (1.803 m)  Wt  136 lb (61.689 kg)  BMI 18.98 kg/m2 Thin,  in no acute distress HEENT: normal Neck: no JVD Cardiac:  normal S1, S2; RRR; no murmurBypass scar is healing well Lungs:  clear to auscultation bilaterally, no wheezing, rhonchi or rales, kyphosis noted Abd: soft, nontender, no hepatomegaly Ext: no edema Skin: warm and dry Neuro:  CNs 2-12 intact, no focal abnormalities noted  EKG:  Normal sinus rhythm, no longer A. fib  ASSESSMENT AND PLAN:  73 year old male with coronary artery disease status post bypass emergently x2, brief postoperative atrial fibrillation, malnutrition, hyperlipidemia, ischemic cardiomyopathy ejection fraction 30-35%.  1. Coronary artery disease   - Overall doing very well, no complaints, no exertional anginal symptoms, Playing golf. Walking frequently. Post bypass.   2. Atrial fibrillation-postoperatively  - Currently in normal sinus rhythm  - I have discontinued his amiodarone. Currently with first degree AV block, normal rate. I am not utilizing beta blocker.  3. Hyperlipidemia  - Continue with statin,40 mg once a day. He is going to have this checked at his physical  4. Malnutrition - Continue to increase weight. Ensure would be helpful.  5. Ischemic cardiomyopathy -Ejection fraction was 25% on 07/31/13 in the setting of his cardiogenic shock. Currently 30-35%. Unable to utilize beta blocker or ACE inhibitor because of hypotension. Discussed ICD. He has a history of esophageal cancer resection in 2004 and has done well he also has a history of Port-A-Cath placement both left and right because of infection during that chemotherapy. He had a lung cancer resected as well, Dr. Servando Snare approximately 2 years ago. He is going to see Dr. Servando Snare this Thursday. I wanted to think about this a little bit more. Overall prognosis, if this can be determined, will be helpful in guidance for defibrillator placement. I explained this to he and his wife at length. They understand. We will be in touch.  [name], [place]  07/19/2014 8:50 AM

## 2014-07-19 NOTE — Patient Instructions (Signed)
The current medical regimen is effective;  continue present plan and medications.  Follow up in 6 months with Dr Marlou Porch.  You will receive a letter in the mail 2 months before you are due.  Please call us when you receive this letter to schedule your follow up appointment.

## 2014-07-21 ENCOUNTER — Encounter: Payer: Self-pay | Admitting: *Deleted

## 2014-07-22 ENCOUNTER — Ambulatory Visit (INDEPENDENT_AMBULATORY_CARE_PROVIDER_SITE_OTHER): Payer: Medicare Other | Admitting: Cardiothoracic Surgery

## 2014-07-22 ENCOUNTER — Ambulatory Visit
Admission: RE | Admit: 2014-07-22 | Discharge: 2014-07-22 | Disposition: A | Discharge status: Home / Self Care | Payer: Medicare Other | Source: Ambulatory Visit | Attending: Cardiothoracic Surgery | Admitting: Cardiothoracic Surgery

## 2014-07-22 ENCOUNTER — Encounter: Payer: Self-pay | Admitting: Cardiothoracic Surgery

## 2014-07-22 VITALS — BP 114/69 | HR 84 | Ht 71.0 in | Wt 136.0 lb

## 2014-07-22 DIAGNOSIS — Z85118 Personal history of other malignant neoplasm of bronchus and lung: Secondary | ICD-10-CM

## 2014-07-22 DIAGNOSIS — C159 Malignant neoplasm of esophagus, unspecified: Secondary | ICD-10-CM

## 2014-07-22 DIAGNOSIS — Z9889 Other specified postprocedural states: Secondary | ICD-10-CM

## 2014-07-22 DIAGNOSIS — I714 Abdominal aortic aneurysm, without rupture, unspecified: Secondary | ICD-10-CM

## 2014-07-22 MED ORDER — IOHEXOL 300 MG/ML  SOLN
75.0000 mL | Freq: Once | INTRAMUSCULAR | Status: AC | PRN
  Administered 2014-07-22: 75 mL via INTRAVENOUS

## 2014-07-22 NOTE — Progress Notes (Signed)
[address]       [place] [address]             [phone]                    [place] Medical Record #[id number] Date of Birth: 08-Feb-1941  Referring: Myriam Jacobson, MD Primary Care: Myriam Jacobson, MD  Chief Complaint:    Chief Complaint  Patient presents with  . F/U THORACIC    5 MO F/U CABG 1 YR F/U CHEST CT HX OF ESOPHAGUS CANCER/LUNG CANCER    History of Present Illness:    Patient is a 73 year old male who returns today with a followup CT scan ordered last year by Dr. Arlyce Dice. He has a long history of multiple different cancers and surgical resection. 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 The patient later underwent removal of the right adrenal gland for metastatic disease. 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  Approximate month after last seen the patient presented with acute myocardial infarction while playing golf and underwent coronary artery bypass grafting by Dr. Cyndia Bent an emergency basis. He is tolerated this well and has returned to near normal activities.  The patient returns today with a followup CT scan to followup on his previously resected left upper lobe 4.5 cm squamous cell carcinoma  Current Activity/ Functional Status:  Patient is independent with mobility/ambulation, transfers, ADL's, IADL's.  Zubrod Score: At the time of surgery this patient's most appropriate activity status/level should be described as: [x]  Normal activity, no symptoms []  Symptoms, fully ambulatory []  Symptoms, in bed less than or equal to 50% of the time []  Symptoms, in bed greater than 50% of the time but less than 100% []  Bedridden []  Moribund   Past Medical History    Diagnosis Date  . Dyslipidemia   . History of esophageal cancer     s/p transhiatal esophagogastrectomy  . Hypothyroidism   . Cerebral aneurysm     [place]. repair  1994  . History of lung cancer   . Pneumonia   . Cancer     Esophageal, adrenal gland, skin; lung  . Stroke 1995    denies residual  . 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 SBO (small bowel obstruction) 08/15/2012    History of small bowel obstruction (status post small bowel       resection, lysis of adhesions, incidental appendectomy, and repair       of left diaphragmatic hernia  . AAA (abdominal aortic aneurysm) 08/22/2012    3.9 cm by CT  - June 2013, stable   . Postoperative atrial fibrillation 12/04/2013    Short course of amiodarone, resolved. Postop bypass    Past Surgical History  Procedure Laterality Date  . Left upper lobectomy with node dissection  02/24/2010    [place]  . Exploratory laparotomy, lysis of adhesions, reduce of incarcerated small bowel and colon from the chest, limited small bowel resection, incidental appendectomy, and then repair of diaphragmatic hernia with alloderm mesh  10/27/2009    [place]  . Left subclavian port- a-cath insertion  03/09/2004    [place]  . Exploratory laparotomy,exploratory thoracotomy for hemorrhage  02/11/2003    [facility]  .  Transhiatal esophagectomy with cholecystectomy, jejunostomy,  pyloroplasty and removal of right subclavian port-a- cath      [facility]  . Brain surgery      brain aneursyn  . Tonsillectomy    . Cholecystectomy    . Coronary artery bypass graft N/A 07/30/2013    Procedure: CORONARY ARTERY BYPASS GRAFTING (CABG) times two on pump using left internal mammary artery and left greater saphenous vein via endovein harvest.;  Surgeon: Gaye Pollack, MD;  Location: [facility] OR;  Service: Open Heart Surgery;  Laterality: N/A;    Family History  Problem Relation Age of Onset  . Aneurysm Mother   . Kidney disease Father   . Heart disease Maternal  Uncle   . Heart disease Paternal Uncle   . Aneurysm Maternal Grandmother     History   Social History  . Marital Status: Married    Spouse Name: N/A    Number of Children: N/A  . Years of Education: N/A   Occupational History  . retired    Social History Main Topics  . Smoking status: Former Smoker    Types: Cigarettes    Quit date: 12/10/2002  . Smokeless tobacco: Never Used  . Alcohol Use: No  . Drug Use: No  . Sexually Active: Not Currently   .   History  Smoking status  . Former Smoker  . Types: Cigarettes  . Quit date: 12/10/2002  Smokeless tobacco  . Never Used    History  Alcohol Use No     No Known Allergies  Current Outpatient Prescriptions  Medication Sig Dispense Refill  . aspirin 81 MG tablet Take 1 tablet (81 mg total) by mouth daily.  30 tablet  0  . atorvastatin (LIPITOR) 40 MG tablet Take 1 tablet (40 mg total) by mouth daily at 6 PM.  30 tablet  5  . guaiFENesin (MUCINEX) 600 MG 12 hr tablet Take 1,200 mg by mouth 2 (two) times daily as needed for congestion.      Marland Kitchen Phenyleph-CPM-DM-APAP (ALKA-SELTZER PLUS COLD & COUGH) 04-10-09-325 MG CAPS Take by mouth.       No current facility-administered medications for this visit.       Review of Systems:     Cardiac Review of Systems: Y or N  Chest Pain [  n  ]  Resting SOB [ n  ] Exertional SOB  [n  ]  Orthopnea [n ]   Pedal Edema [ n  ]    Palpitations [ n ] Syncope  [n ]   Presyncope [n  ]  General Review of Systems: [Y] = yes [  ]=no Constitional: recent weight change [n  ]; anorexia [  ]; fatigue [n  ]; nausea [  ]; night sweats [  ]; fever [  ]; or chills [  ];  Dental: poor dentition[  ]; Last Dentist visit:   Eye : blurred vision [  ]; diplopia [   ]; vision changes [  ];  Amaurosis fugax[  ]; Resp: cough [  ];  wheezing[  ];  hemoptysis[  ]; shortness of  breath[  ]; paroxysmal nocturnal dyspnea[  ]; dyspnea on exertion[  ]; or orthopnea[  ];  GI:  gallstones[  ], vomiting[  ];  dysphagia[  ]; melena[  ];  hematochezia [  ]; heartburn[  ];   Hx of  Colonoscopy[  ]; GU: kidney stones [  ]; hematuria[  ];   dysuria [  ];  nocturia[  ];  history of     obstruction [  ]; urinary frequency [  ]             Skin: rash, swelling[  ];, hair loss[  ];  peripheral edema[n  ];  or itching[  ]; Musculosketetal: myalgias[  ];  joint swelling[  ];  joint erythema[  ];  joint pain[  ];  back pain[  ];  Heme/Lymph: bruising[  ];  bleeding[  ];  anemia[  ];  Neuro: TIA[  ];  headaches[  ];  stroke[  ];  vertigo[  ];  seizures[  ];   paresthesias[  ];  difficulty walking[  ];  Psych:depression[  ]; anxiety[  ];  Endocrine: diabetes[  ];  thyroid dysfunction[  ];  Immunizations: Flu [  ]; Pneumococcal[  ];  Other:  Physical Exam: BP 114/69  Pulse 84  Ht 5\' 11"  (1.803 m)  Wt 136 lb (61.689 kg)  BMI 18.98 kg/m2  SpO2 98%  General appearance: alert and cooperative Neurologic: intact Heart: regular rate and rhythm, S1, S2 normal, no murmur, click, rub or gallop Lungs: clear to auscultation bilaterally Abdomen: soft, non-tender; bowel sounds normal; no masses,  no organomegaly, easily palpable  by palpation remains approximately 4 cmAAA non tender Extremities: extremities normal, atraumatic, no cyanosis or edema Wound: I do not appreciate any cervical or supraclavicular adenopathy the patient's cervical neck incision is well-healed his abdominal and right thoracotomy incisions are also well healed. The patient's sternotomy incision is well-healed  Diagnostic Studies & Laboratory data:     Recent Radiology Findings: Dg Chest 1 View  07/13/2013   *RADIOLOGY REPORT*  Clinical Data: Post right thoracentesis  CHEST - 1 VIEW  Comparison: 12/05/2011 chest radiograph, PET CT 07/07/2013  Findings: A bulla at the right base is incidentally re-identified. Mild fluid  tracking along the right minor fissure is noted.  Small left effusion is present.  No pneumothorax.  Left apical clips are present.  Heart size is normal.  Right upper quadrant clips. Emphysematous changes and patchy ill-defined predominately right lower lobe airspace opacity is identified.  IMPRESSION: No pneumothorax after thoracentesis.   Original Report Authenticated By: Conchita Paris, M.D.   Ct Chest Wo Contrast  07/13/2013   *RADIOLOGY REPORT*  Clinical Data: History of lung carcinoma with right pleural effusion and abnormal pleural enhancement by PET scan.  Evaluation is performed for possible pleural biopsy.  CT CHEST WITHOUT CONTRAST  Technique:  Multidetector CT imaging of the chest was performed following the standard protocol without IV contrast.  Comparison: PET scan on 07/07/2013  Findings: Imaging was performed in a prone position. There is no evidence of right-sided pleural mass.  A small pleural effusion is present which layers dependently in the anterior hemithorax when the patient is in a prone position.  Significant COPD present.  No pulmonary nodules are identified.  IMPRESSION: No evidence of pleural mass.  A small layering right-sided pleural effusion is present. An ultrasound-guided right-sided thoracentesis was subsequently performed.   Original Report Authenticated By: Aletta Edouard, M.D.   Nm Pet Image Restag (ps) Skull Base To Thigh  07/07/2013   *RADIOLOGY REPORT*  Clinical Data: Subsequent treatment strategy for esophageal cancer and lung cancer.  NUCLEAR MEDICINE PET SKULL BASE TO THIGH  Fasting Blood Glucose:  89  Technique:  15.1 mCi F-18 FDG was injected intravenously. CT data was obtained and used for attenuation correction and anatomic localization only.  (This was not acquired as a diagnostic CT examination.) Additional exam technical data entered on technologist worksheet.  Comparison:  07/02/2013  Findings:  Neck: No hypermetabolic lymph nodes in the neck.  Chest:   Postoperative change from esophagectomy and gastric pull- through identified.  A left upper lobectomy has also been performed.  There has been interval decrease in volume of bilateral pleural effusions.  There is a small focus of mild increased FDG uptake localizing to the previously described loculated right pleural effusion.  The SUV max within this area is equal to 3.0, image 134.  No hypermetabolic mediastinal or hilar nodes.  No suspicious pulmonary nodules on the CT scan.  Abdomen/Pelvis:  No abnormal hypermetabolic activity within the liver, pancreas, adrenal glands, or spleen.  No hypermetabolic lymph nodes in the abdomen or pelvis. Again noted is an abdominal aortic aneurysm.  The maximum AP dimension is equal to 3.6, image 172/series 2.  Skeleton:  No focal hypermetabolic activity to suggest skeletal metastasis.  IMPRESSION:  1.  There is a small focus of mild increased uptake localizing to the partially loculated right pleural effusion.  This is nonspecific and may reflect inflammation or infection.  Malignant pleural effusion cannot be excluded.  Consider further evaluation with diagnostic thoracentesis. 2.  Infrarenal abdominal aortic aneurysm.   Original Report Authenticated By: Kerby Moors, M.D.     Ct Chest W Contrast  07/02/2013   *RADIOLOGY REPORT*  Clinical Data: History of esophageal cancer with esophagectomy and gastric pull-through.  CT CHEST WITH CONTRAST  Technique:  Multidetector CT imaging of the chest was performed following the standard protocol during bolus administration of intravenous contrast.  Contrast: 120mL OMNIPAQUE IOHEXOL 300 MG/ML  SOLN  Comparison: 05/26/2012  Findings: The lungs are well-aerated bilaterally with emphysematous changes noted. A small right-sided pleural effusion is identified with some associated right lower lobe atelectasis.  This is new from prior exam.  No new focal infiltrate or sizable parenchymal nodule is seen.  There are changes consistent with the  patient's given clinical history of esophagectomy and gastric pull-through.  An air and fluid is noted within the stomach.  No obstructive changes are seen.  The thoracic aorta and pulmonary artery are less than stable.  Mild dilatation of the proximal ascending aorta is again seen and stable.  Heavy coronary calcifications are again seen. No hilar or mediastinal adenopathy is identified.  Scanning into the upper abdomen reveals postsurgical changes.  IMPRESSION: Changes consistent with the known history of esophagectomy and gastric pull-through.  The overall appearance is stable.  No recurrent disease is seen.  Stable dilatation of the ascending aorta.  Stable emphysematous changes.   Original Report Authenticated By: Inez Catalina, M.D.   Ct Abdomen W Contrast  07/02/2013   *RADIOLOGY REPORT*  Clinical Data: Follow-up for the soft medial and lung cancer.  CT ABDOMEN WITH CONTRAST  Technique:  Multidetector CT imaging of the abdomen was performed following the standard protocol during bolus administration of intravenous contrast.  Contrast:  100 ml of Omnipaque-300  Comparison: May 26, 2012  CT of the chest:  Findings: There are extensive emphysematous changes of bilateral lungs.  Stable postsurgical changes are identified involving the left hemithorax.  There are no pulmonary nodules or mass.  There is interval developed minimal left pleural effusion and moderate right pleural effusion.  Along the medial aspect of the right pleural effusion, there is suggestion of pleural based 1 cm nodular enhancement best seen on series 3 image 46.  There is no mediastinal or hilar lymphadenopathy.  The patient is status post prior gastric pull-through without interval change.  The heart size is normal.  There is no pericardial effusion.  Images of the bones demonstrate no focal discrete lytic or blastic lesion.  Impression:  Interval developed bilateral pleural effusions, right greater than left.  Along the medial aspect of  the right pleural effusion, there is suggestion of a pleural based 1 cm nodule enhancement new since prior exam.  Metastasis is not excluded. Further evaluation with right thoracentesis with cytology may be helpful if clinically indicated.  CT of the abdomen:  Findings:  Images of the abdomen demonstrate normal liver.  The patient is status post prior cholecystectomy.  There is postsurgical common bile duct dilatation measuring 1.3 cm unchanged compared prior exam.  The spleen, pancreas, left adrenal gland are normal.  There are bilateral kidneys cysts unchanged.  There is no hydronephrosis bilaterally.  There are stable postsurgical changes in the right retroperitoneum.  There are small stable small lymph nodes in the retroperitoneum periaortic region unchanged.  There is stable infrarenal abdominal aorta measuring 4.1 cm in diameter.  The visualized bowel is normal.  Images of the bones demonstrate no definite focal lytic or blastic lesions.  IMPRESSION: Stable abdomen CT without change compared prior exam.  No CT evidence of abdominal metastatic disease.   Original Report Authenticated By: Abelardo Diesel, M.D.    The patient's CT scans were read simultaneously by 2 different radiologists with conflicting reports in inconsistencies. I've talked to Dr. Ardeen Garland who is reviewed the CT of the chest and abdomen, he will have the reports amended. On my interpretation The patient has a 4 cm abdominal aortic aneurysm mildly dilated ascending aorta 3.6 cm. There is a new right pleural effusion with some nodularity and enhancement suggestive of a malignant effusion.  Recent Lab Findings: Lab Results  Component Value Date   WBC 8.2 08/06/2013   HGB 10.4* 08/06/2013   HCT 30.9* 08/06/2013   PLT 340 08/06/2013   GLUCOSE 109* 08/06/2013   CHOL 92 07/31/2013   TRIG 73 07/31/2013   HDL 35* 07/31/2013   LDLCALC 42 07/31/2013   ALT 46 08/02/2013   AST 55* 08/02/2013   NA 136 08/06/2013   K 3.7 08/06/2013   CL 98 08/06/2013    CREATININE 0.80 08/08/2013   BUN 28* 08/06/2013   CO2 28 08/06/2013   TSH 1.766 07/31/2013   INR 1.52* 07/31/2013   HGBA1C 6.5* 07/30/2013    Diagnosis PLEURAL FLUID, RIGHT NO MALIGNANT CELLS IDENTIFIED. 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 Follow ct of the chest 12 months- CT scan of the chest done today shows the ascending aorta to be of stable size and no evidence of recurrent malignancy in the chest.  Incidental finding dilatation of the ascending aorta 3.6 and 4 cm abdominal aortic aneurysm. I referred the patient to Dr. Trula Slade last year for evaluation and following of his abdominal aortic aneurysm., But the patient never went he has an appointment for September with abdominal ultrasound.  Patient has a history of cerebral aneurysm repaired 1999, both his mother and grandmother died at an early age from cerebral aneurysms.   Plan see the patient back with followup CT of the chest in 12 months  Grace Isaac MD      [address] [place] [address] Office [phone]   Beeper [phone]  07/22/2014 12:26 PM

## 2014-07-23 ENCOUNTER — Other Ambulatory Visit: Payer: Self-pay | Admitting: *Deleted

## 2014-07-23 DIAGNOSIS — I714 Abdominal aortic aneurysm, without rupture, unspecified: Secondary | ICD-10-CM

## 2014-07-30 ENCOUNTER — Telehealth: Payer: Self-pay | Admitting: Cardiology

## 2014-07-30 NOTE — Telephone Encounter (Signed)
Since he has gotten good news on his cancer being in remission, please set up a consultation with EP to discuss the possibility of ICD. I discussed with him some of the pros and cons in our office visit and over the telephone and he would like to discuss this option with them. 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  Candee Furbish, MD

## 2014-08-04 ENCOUNTER — Ambulatory Visit: Payer: Medicare Other | Admitting: Cardiology

## 2014-08-04 NOTE — Telephone Encounter (Signed)
appt scheduled for 9/1 with Dr Caryl Comes.

## 2014-08-10 ENCOUNTER — Telehealth: Payer: Self-pay | Admitting: *Deleted

## 2014-08-10 ENCOUNTER — Encounter: Payer: Self-pay | Admitting: Internal Medicine

## 2014-08-10 ENCOUNTER — Ambulatory Visit (INDEPENDENT_AMBULATORY_CARE_PROVIDER_SITE_OTHER): Payer: Medicare Other | Admitting: Internal Medicine

## 2014-08-10 VITALS — BP 94/58 | HR 73 | Ht 70.0 in | Wt 138.4 lb

## 2014-08-10 DIAGNOSIS — I2589 Other forms of chronic ischemic heart disease: Secondary | ICD-10-CM

## 2014-08-10 DIAGNOSIS — I255 Ischemic cardiomyopathy: Secondary | ICD-10-CM

## 2014-08-10 MED ORDER — CARVEDILOL 3.125 MG PO TABS: 3.1250 mg | ORAL_TABLET | Freq: Two times a day (BID) | ORAL | Status: DC

## 2014-08-10 NOTE — Progress Notes (Signed)
ELECTROPHYSIOLOGY CONSULT NOTE  Patient ID: Glen Green, MRN: [id number], DOB/AGE: 1941/10/06 72 y.o. Admit date: (Not on file) Date of Consult: 08/10/2014  Primary Physician: Glen Coffin, MD Primary Cardiologist: MS  Chief Complaint:  ICD   HPI Glen Green is a 73 y.o. male  Referred for consideration of an ICD  He has a history of ischemic heart disease. He presented 8/14 with shock and heart failure and was found to have an occluded LAD and circumflex and underwent emergency bypass surgery. Postoperative course was complicated by atrial fibrillation for which he took amiodarone for a brief period of time.    Hypotension has limited use of guideline directed medical therapy including beta blockers ACE inhibitors and aldosterone antagonists  He denies symptoms of lightheadedness dizziness or syncope.   His chronic shortness of breath which is aggravated by humidity and  extreme cold. He has known emphysema. He is also status post left upper lobectomy.  He denies peripheral edema or nocturnal dyspnea.  Echocardiogram 7/15 VS 30-35% with moderate MR  His history of esophageal cancer for which she underwent esophagectomy 2004 following chemotherapy. He underwent right adrenalectomy and left upper lobectomy in 2011  He also has a AAA which is being followed by vascular.  Past Medical History  Diagnosis Date  . Dyslipidemia   . History of esophageal cancer     s/p transhiatal esophagogastrectomy  . Hypothyroidism   . Cerebral aneurysm     TX. repair  1994  . History of lung cancer   . Pneumonia   . Cancer     Esophageal, adrenal gland, skin; lung  . Stroke 1995    denies residual  . 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 SBO (small bowel obstruction) 08/15/2012    History of small bowel obstruction (status post small bowel       resection, lysis of adhesions, incidental appendectomy, and repair       of left diaphragmatic hernia  . AAA (abdominal  aortic aneurysm) 08/22/2012    3.9 cm by CT  - June 2013, stable   . Postoperative atrial fibrillation 12/04/2013    Short course of amiodarone, resolved. Postop bypass      Surgical History:  Past Surgical History  Procedure Laterality Date  . Left upper lobectomy with node dissection  02/24/2010    [place]  . Exploratory laparotomy, lysis of adhesions, reduce of incarcerated small bowel and colon from the chest, limited small bowel resection, incidental appendectomy, and then repair of diaphragmatic hernia with alloderm mesh  10/27/2009    [place]  . Left subclavian port- a-cath insertion  03/09/2004    [place]  . Exploratory laparotomy,exploratory thoracotomy for hemorrhage  02/11/2003    [place]  . Transhiatal esophagectomy with cholecystectomy, jejunostomy,  pyloroplasty and removal of right subclavian port-a- cath      [facility]  . Brain surgery      brain aneursyn  . Tonsillectomy    . Cholecystectomy    . Coronary artery bypass graft N/A 07/30/2013    Procedure: CORONARY ARTERY BYPASS GRAFTING (CABG) times two on pump using left internal mammary artery and left greater saphenous vein via endovein harvest.;  Surgeon: Gaye Pollack, MD;  Location: [facility] OR;  Service: Open Heart Surgery;  Laterality: N/A;     Home Meds: Prior to Admission medications   Medication Sig Start Date End Date Taking? Authorizing Provider  aspirin 81 MG tablet Take 1 tablet (81 mg total)  by mouth daily. 12/04/13  Yes Candee Furbish, MD  atorvastatin (LIPITOR) 40 MG tablet Take 1 tablet (40 mg total) by mouth daily at 6 PM. 06/22/14  Yes Candee Furbish, MD  guaiFENesin (MUCINEX) 600 MG 12 hr tablet Take 1,200 mg by mouth 2 (two) times daily as needed for congestion.   Yes Historical Provider, MD  Phenyleph-CPM-DM-APAP (ALKA-SELTZER PLUS COLD & COUGH) 04-10-09-325 MG CAPS Take by mouth as needed (for colds and coughs.).    Yes Historical Provider, MD       Allergies: No Known Allergies  History   Social History  .  Marital Status: Married    Spouse Name: N/A    Number of Children: N/A  . Years of Education: N/A   Occupational History  . retired    Social History Main Topics  . Smoking status: Former Smoker    Types: Cigarettes    Quit date: 12/10/2002  . Smokeless tobacco: Never Used  . Alcohol Use: No  . Drug Use: No  . Sexual Activity: Not Currently   Other Topics Concern  . Not on file   Social History Narrative   Pt lives with wife.      Family History  Problem Relation Age of Onset  . Aneurysm Mother   . Kidney disease Father   . Heart disease Maternal Uncle   . Heart disease Paternal Uncle   . Aneurysm Maternal Grandmother      ROS:  Please see the history of present illness.     All other systems reviewed and negative.    Physical Exam:   Blood pressure 94/58, pulse 73, height 5\' 10"  (1.778 m), weight 138 lb 6.4 oz (62.778 kg). General: Well developed, cachetic   male in no acute distress. Head: Normocephalic, atraumatic, sclera non-icteric, no xanthomas, nares are without discharge. EENT: normal Lymph Nodes:  none Back: without scoliosis/kyphosis , no CVA tendersness Neck: Negative for carotid bruits. JVD not elevated. Lungs: Clear bilaterally to auscultation without wheezes, rales, or rhonchi. Breathing is unlabored. Heart: RRR with S1 S2. No  2/6 systolic murmur , rubs, or gallops appreciated. Abdomen: Soft, non-tender, non-distended with normoactive bowel sounds. No hepatomegaly. No rebound/guarding. No obvious abdominal masses. Msk:  Strength and tone appear normal for age. Extremities: No clubbing or cyanosis. No edema.  Distal pedal pulses are 2+ and equal bilaterally. Skin: Warm and Dry Neuro: Alert and oriented X 3. CN III-XII intact Grossly normal sensory and motor function . Psych:  Responds to questions appropriately with a normal affect.      Labs: Cardiac Enzymes No results found for this basename: CKTOTAL, CKMB, TROPONINI,  in the last 72  hours CBC Lab Results  Component Value Date   WBC 8.2 08/06/2013   HGB 10.4* 08/06/2013   HCT 30.9* 08/06/2013   MCV 89.0 08/06/2013   PLT 340 08/06/2013   PROTIME: No results found for this basename: LABPROT, INR,  in the last 72 hours Chemistry No results found for this basename: NA, K, CL, CO2, BUN, CREATININE, CALCIUM, LABALBU, PROT, BILITOT, ALKPHOS, ALT, AST, GLUCOSE,  in the last 168 hours Lipids Lab Results  Component Value Date   CHOL 92 07/31/2013   HDL 35* 07/31/2013   LDLCALC 42 07/31/2013   TRIG 73 07/31/2013   BNP Pro B Natriuretic peptide (BNP)  Date/Time Value Ref Range Status  07/30/2013 12:53 PM 10512.0* 0 - 125 pg/mL Final  12/05/2011  4:20 PM 777.7* 0 - 125 pg/mL Final  08/16/2007  6:00  AM 55.8   Final   Miscellaneous No results found for this basename: DDIMER    Radiology/Studies:  Ct Chest W Contrast  07/22/2014   CLINICAL DATA:  History of left lung and esophageal cancer with surgery, chemotherapy and radiation therapy. History of abdominal aortic aneurysm.  EXAM: CT CHEST WITH CONTRAST  TECHNIQUE: Multidetector CT imaging of the chest was performed during intravenous contrast administration.  CONTRAST:  37mL OMNIPAQUE IOHEXOL 300 MG/ML  SOLN  COMPARISON:  Radiographs 09/02/2013. PET-CT 07/07/2013 and chest CT 07/02/2013.  FINDINGS: Mediastinum: There are stable postsurgical changes related to esophagectomy with gastric pull-through. Soft tissue stranding within the pre-vascular fat is stable. There are no enlarged mediastinal, hilar or axillary lymph nodes. The heart size is normal. Patient is status post median sternotomy. There is stable atherosclerosis of the aorta, great vessels and coronary arteries. The ascending aorta measures 4.1 cm in diameter. No focal aneurysm demonstrated.  Lungs/Pleura: Previously demonstrated right pleural effusion has resolved. There is mild high-density pleural nodularity near the right posterior costophrenic angle, possibly related to  previous pleurodesis. There is no significant left pleural or pericardial effusion. There is stable volume loss in the left hemithorax status post left upper lobe resection. Moderate emphysematous changes are noted. There is no suspicious pulmonary nodule or confluent airspace opacity.  Upper abdomen: There are postsurgical changes in the right subphrenic area, possibly from previous adrenalectomy. The gallbladder surgically absent. No acute findings demonstrated.  Musculoskeletal/Chest wall: No acute or suspicious osseous findings. Thoracic spine degenerative changes and Schmorl's nodes are grossly stable. Thoracotomy changes noted.  IMPRESSION: 1. Stable mild dilatation of the ascending aorta status post median sternotomy and CABG. 2. Stable postsurgical changes status post left upper lobe resection, esophagectomy and gastric pull-through. 3. No evidence of thoracic metastatic disease. 4. Resolved right pleural effusion with findings suggesting interval pleurodesis. 5. Emphysema.   Electronically Signed   By: Camie Patience M.D.   On: 07/22/2014 11:11    EKG: Sinus 73 22/11/39    Assessment and Plan:    Esophageal cancer with resection 2004 with metastases--apparently in remission  S/p LUL lobectomy and adrenalectomy  Ischemic heart disease with prior bypass surgery EF 30-35%  Hypotension-asymptomatic  COPD-emphysema  The patient has  hypotension which has diminished enthusiasm for the initiation of ACE inhibitors and beta blockers.  I wonder, however, given the paucity of symptoms, other wouldn't be worth trying low doses are full to see if there wouldn't be both an impact on his LV function as well as the overall benefits associated with guidelines directed therapy.  I will review this with Dr. Jamesetta So; I have reviewed this issue with the patient and his wife. They are agreeable to this discussion.  She also notes that he "almost died" when he got his Port-A-Cath x2. They are open to the idea of the  subcutaneous CT in the event that we proceed that way      Virl Axe

## 2014-08-10 NOTE — Patient Instructions (Signed)
Your physician recommends that you continue on your current medications as directed. Please refer to the Current Medication list given to you today.  Dr. Caryl Comes will talk with Dr. Marlou Porch, we will contact you with their decision

## 2014-08-10 NOTE — Telephone Encounter (Signed)
Pt's wife aware for pt to start carvedilol.  She will monitor him for hypotension and call with any problems or concerns.  appt scheduled to f/u

## 2014-08-10 NOTE — Telephone Encounter (Signed)
Per Dr Marlou Porch who spoke with Dr Caryl Comes about pts medications and present condition.  He would like pt to be started on Carvedilol 3.125 mg one BID today.  Pt should follow up in 1 month.

## 2014-08-20 ENCOUNTER — Encounter: Payer: Self-pay | Admitting: Surgery

## 2014-08-23 ENCOUNTER — Ambulatory Visit (HOSPITAL_COMMUNITY)
Admission: RE | Admit: 2014-08-23 | Discharge: 2014-08-23 | Disposition: A | Discharge status: Home / Self Care | Payer: Medicare Other | Source: Ambulatory Visit | Attending: Surgery | Admitting: Surgery

## 2014-08-23 ENCOUNTER — Encounter: Payer: Self-pay | Admitting: Surgery

## 2014-08-23 ENCOUNTER — Ambulatory Visit (INDEPENDENT_AMBULATORY_CARE_PROVIDER_SITE_OTHER): Payer: Medicare Other | Admitting: Surgery

## 2014-08-23 VITALS — BP 106/65 | HR 65 | Ht 70.0 in | Wt 136.7 lb

## 2014-08-23 DIAGNOSIS — I714 Abdominal aortic aneurysm, without rupture, unspecified: Secondary | ICD-10-CM | POA: Diagnosis not present

## 2014-08-23 NOTE — Progress Notes (Signed)
Patient name: Glen Green MRN: [id number] DOB: 01-10-1941 Sex: male   Referred by: Dr. Servando Snare  Reason for referral:  Chief Complaint  Patient presents with  . New Evaluation    AAA    HISTORY OF PRESENT ILLNESS: This is a very pleasant 73 year old gentleman who is referred today for evaluation of an abdominal aortic aneurysm.  This was reportedly detected approximately 4 years ago.  It has remained stable in size measuring approximately 4 cm.  He denies any abdominal pain or back pain.  The patient has a history of an ischemic heart disease.  He presented in August of 2014 with shock and heart failure and was found to have an occluded LAD and circumflex.  He underwent emergent cardiac bypass surgery.  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  He has a history of esophageal cancer, status post distal esophagectomy and 2004, followed by chemotherapy.  He is also status post right adrenalectomy and left upper lobectomy in 2011, presumably for metastatic disease.  He suffers from hypercholesterolemia, treated with a statin.  He has a history of smoking but quit in 2004.  He also has a history of a ruptured cerebral aneurysm.  Past Medical History  Diagnosis Date  . Dyslipidemia   . History of esophageal cancer     s/p transhiatal esophagogastrectomy  . Hypothyroidism   . Cerebral aneurysm     [place]. repair  1994  . History of lung cancer   . Pneumonia   . Cancer     Esophageal, adrenal gland, skin; lung  . Stroke 1995    denies residual  . 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 SBO (small bowel obstruction) 08/15/2012    History of small bowel obstruction (status post small bowel       resection, lysis of adhesions, incidental appendectomy, and repair       of left diaphragmatic hernia  . AAA (abdominal aortic aneurysm) 08/22/2012    3.9 cm by CT  - June 2013, stable   . Postoperative atrial fibrillation 12/04/2013    Short course of amiodarone, resolved.  Postop bypass  . CAD (coronary artery disease)   . Atrial fibrillation     Past Surgical History  Procedure Laterality Date  . Left upper lobectomy with node dissection  02/24/2010    [place]  . Exploratory laparotomy, lysis of adhesions, reduce of incarcerated small bowel and colon from the chest, limited small bowel resection, incidental appendectomy, and then repair of diaphragmatic hernia with alloderm mesh  10/27/2009    [place]  . Left subclavian port- a-cath insertion  03/09/2004    [place]  . Exploratory laparotomy,exploratory thoracotomy for hemorrhage  02/11/2003    [place]  . Transhiatal esophagectomy with cholecystectomy, jejunostomy,  pyloroplasty and removal of right subclavian port-a- cath      [facility]  . Brain surgery      brain aneursyn  . Tonsillectomy    . Cholecystectomy    . Coronary artery bypass graft N/A 07/30/2013    Procedure: CORONARY ARTERY BYPASS GRAFTING (CABG) times two on pump using left internal mammary artery and left greater saphenous vein via endovein harvest.;  Surgeon: Gaye Pollack, MD;  Location: [facility] OR;  Service: Open Heart Surgery;  Laterality: N/A;    History   Social History  . Marital Status: Married    Spouse Name: N/A    Number of Children: N/A  . Years of Education: N/A   Occupational History  .  retired    Social History Main Topics  . Smoking status: Former Smoker    Types: Cigarettes    Quit date: 12/10/2002  . Smokeless tobacco: Never Used  . Alcohol Use: No  . Drug Use: No  . Sexual Activity: Not Currently   Other Topics Concern  . Not on file   Social History Narrative   Pt lives with wife.     Family History  Problem Relation Age of Onset  . Aneurysm Mother   . Kidney disease Father   . Heart disease Maternal Uncle   . Heart disease Paternal Uncle   . Aneurysm Maternal Grandmother   . Diabetes Sister     Allergies as of 08/23/2014  . (No Known Allergies)    Current Outpatient Prescriptions on File Prior  to Visit  Medication Sig Dispense Refill  . aspirin 81 MG tablet Take 1 tablet (81 mg total) by mouth daily.  30 tablet  0  . atorvastatin (LIPITOR) 40 MG tablet Take 1 tablet (40 mg total) by mouth daily at 6 PM.  30 tablet  5  . carvedilol (COREG) 3.125 MG tablet Take 1 tablet (3.125 mg total) by mouth 2 (two) times daily.  60 tablet  6  . guaiFENesin (MUCINEX) 600 MG 12 hr tablet Take 1,200 mg by mouth 2 (two) times daily as needed for congestion.      Marland Kitchen Phenyleph-CPM-DM-APAP (ALKA-SELTZER PLUS COLD & COUGH) 04-10-09-325 MG CAPS Take by mouth as needed (for colds and coughs.).        No current facility-administered medications on file prior to visit.     REVIEW OF SYSTEMS: Cardiovascular: No chest pain, chest pressure, palpitations, orthopnea, or dyspnea on exertion. No claudication or rest pain,  No history of DVT or phlebitis. Pulmonary: No productive cough, asthma or wheezing. Neurologic: No weakness, paresthesias, aphasia, or amaurosis. No dizziness. Hematologic: No bleeding problems or clotting disorders. Musculoskeletal: No joint pain or joint swelling. Gastrointestinal: No blood in stool or hematemesis Genitourinary: No dysuria or hematuria. Psychiatric:: No history of major depression. Integumentary: No rashes or ulcers. Constitutional: No fever or chills.  PHYSICAL EXAMINATION: General: The patient appears their stated age.  Vital signs are BP 106/65  Pulse 65  Ht 5\' 10"  (1.778 m)  Wt 136 lb 11.2 oz (62.007 kg)  BMI 19.61 kg/m2  SpO2 100% HEENT:  No gross abnormalities Pulmonary: Respirations are non-labored Abdomen: Soft and non-tender.  Aorta is easily palpable and nontender. Musculoskeletal: There are no major deformities.   Neurologic: No focal weakness or paresthesias are detected, Skin: There are no ulcer or rashes noted. Psychiatric: The patient has normal affect. Cardiovascular: There is a regular rate and rhythm without significant murmur appreciated.  No  carotid bruits.  Pedal pulses are not palpable.  Diagnostic Studies: Abdominal ultrasound has been ordered and reviewed.  Maximum aortic diameter is 4.06    Assessment:  Abdominal aortic aneurysm Plan: The patient's aneurysm has remained stable over the past several years, measuring around 4 cm.  We discussed the different modalities for treatment as well as the indications.  I have scheduled him for followup in one year with an abdominal ultrasound.     Eldridge Abrahams, M.D. [facility] Office: [phone] Pager:  [phone]

## 2014-09-13 ENCOUNTER — Ambulatory Visit (INDEPENDENT_AMBULATORY_CARE_PROVIDER_SITE_OTHER): Payer: Medicare Other | Admitting: Cardiology

## 2014-09-13 ENCOUNTER — Encounter: Payer: Self-pay | Admitting: Cardiology

## 2014-09-13 VITALS — BP 128/82 | HR 58 | Ht 70.0 in | Wt 140.0 lb

## 2014-09-13 DIAGNOSIS — I5023 Acute on chronic systolic (congestive) heart failure: Secondary | ICD-10-CM | POA: Insufficient documentation

## 2014-09-13 DIAGNOSIS — I714 Abdominal aortic aneurysm, without rupture, unspecified: Secondary | ICD-10-CM

## 2014-09-13 DIAGNOSIS — Z85118 Personal history of other malignant neoplasm of bronchus and lung: Secondary | ICD-10-CM

## 2014-09-13 DIAGNOSIS — I255 Ischemic cardiomyopathy: Secondary | ICD-10-CM

## 2014-09-13 DIAGNOSIS — I2510 Atherosclerotic heart disease of native coronary artery without angina pectoris: Secondary | ICD-10-CM

## 2014-09-13 DIAGNOSIS — I5022 Chronic systolic (congestive) heart failure: Secondary | ICD-10-CM

## 2014-09-13 MED ORDER — LISINOPRIL 5 MG PO TABS: 5.0000 mg | ORAL_TABLET | Freq: Every day | ORAL | Status: DC

## 2014-09-13 NOTE — Progress Notes (Signed)
Patient ID: Glen Green, male   DOB: 1941/06/26, 73 y.o.   MRN: [id number]      [address] [place], [place]  [address] Phone: [phone] Fax:  [phone]  Date:  09/13/2014   ID:  Glen Green, DOB 01/30/1941, MRN [id number]  PCP:  Donnie Coffin, MD    History of Present Illness: Glen Green is a 73 y.o. male here with coronary artery disease status post bypass surgery, ischemic cardiomyopathy EF 30-35% here for followup. He had emergency CABG x2 on 07/31/13 secondary to severe multivessel coronary artery disease with occluded LAD, occluded circumflex and severely decreased left ventricular dysfunction with acute myocardial infarction and cardiogenic shock.  Prolonged recovery. Postop atrial fibrillation was noted then he converted to sinus rhythm on amiodarone (no longer taking).  No syncope, no bleeding, no CP.   His ejection fraction was 25% currently 30-35%.   Cancer 2004 perspective, chemotherapy, right and left Port-A-Cath placement infection. Lung carcinoma approximately 2 years ago, Dr. Servando Snare.  4 cm abdominal aortic aneurysm, Dr. Trula Slade.   Consultation with Dr. Caryl Comes, EP-starting heart failure medications. Tolerating carvedilol low-dose well. Starting lisinopril.   Wt Readings from Last 3 Encounters:  09/13/14 140 lb (63.504 kg)  08/23/14 136 lb 11.2 oz (62.007 kg)  08/10/14 138 lb 6.4 oz (62.778 kg)     Past Medical History  Diagnosis Date  . Dyslipidemia   . History of esophageal cancer     s/p transhiatal esophagogastrectomy  . Hypothyroidism   . Cerebral aneurysm     TX. repair  1994  . History of lung cancer   . Pneumonia   . Cancer     Esophageal, adrenal gland, skin; lung  . Stroke 1995    denies residual  . 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 SBO (small bowel obstruction) 08/15/2012    History of small bowel obstruction (status post small bowel       resection, lysis of adhesions, incidental appendectomy,  and repair       of left diaphragmatic hernia  . AAA (abdominal aortic aneurysm) 08/22/2012    3.9 cm by CT  - June 2013, stable   . Postoperative atrial fibrillation 12/04/2013    Short course of amiodarone, resolved. Postop bypass  . CAD (coronary artery disease)   . Atrial fibrillation     Current Outpatient Prescriptions  Medication Sig Dispense Refill  . aspirin 81 MG tablet Take 1 tablet (81 mg total) by mouth daily.  30 tablet  0  . atorvastatin (LIPITOR) 40 MG tablet Take 1 tablet (40 mg total) by mouth daily at 6 PM.  30 tablet  5  . carvedilol (COREG) 3.125 MG tablet Take 1 tablet (3.125 mg total) by mouth 2 (two) times daily.  60 tablet  6  . guaiFENesin (MUCINEX) 600 MG 12 hr tablet Take 1,200 mg by mouth 2 (two) times daily as needed for congestion.      Marland Kitchen Phenyleph-CPM-DM-APAP (ALKA-SELTZER PLUS COLD & COUGH) 04-10-09-325 MG CAPS Take by mouth as needed (for colds and coughs.).        No current facility-administered medications for this visit.    Allergies:   No Known Allergies  Social History:  The patient  reports that he quit smoking about 11 years ago. His smoking use included Cigarettes. He smoked 0.00 packs per day. He has never used smokeless tobacco. He reports that he does not drink alcohol or use  illicit drugs.   ROS:  Please see the history of present illness.   Denies any bleeding, syncope, orthopnea, PND All other systems reviewed and negative.   PHYSICAL EXAM: VS:  BP 128/82  Pulse 58  Ht 5\' 10"  (1.778 m)  Wt 140 lb (63.504 kg)  BMI 20.09 kg/m2 Thin,  in no acute distress HEENT: normal Neck: no JVD Cardiac:  normal S1, S2; RRR; no murmurBypass scar is healing well Lungs:  clear to auscultation bilaterally, no wheezing, rhonchi or rales, kyphosis noted Abd: soft, nontender, no hepatomegaly Ext: no edema Skin: warm and dry Neuro:  CNs 2-12 intact, no focal abnormalities noted  EKG:  Normal sinus rhythm, no longer A. fib  ASSESSMENT AND  PLAN:  73 year old male with coronary artery disease status post bypass emergently x2, brief postoperative atrial fibrillation, malnutrition, hyperlipidemia, ischemic cardiomyopathy ejection fraction 30-35%.  1. Coronary artery diseasex  - Overall doing very well, no complaints, no exertional anginal symptoms, Playing golf. Walking frequently. Post bypass.   2. Atrial fibrillation-postoperatively  - Currently in normal sinus rhythm  - I have discontinued his amiodarone. Currently with first degree AV block, normal rate. I am not utilizing beta blocker.  3. Hyperlipidemia  - Continue with statin,40 mg once a day. He is going to have this checked at his physical  4. Malnutrition - Continue to increase weight. Ensure would be helpful.  5. Ischemic cardiomyopathy -Ejection fraction was 25% on 07/31/13 in the setting of his cardiogenic shock. Currently 30-35%. Previously, unable to utilize beta blocker or ACE inhibitor because of hypotension. Now on low-dose carvedilol 3.125 twice a day. I will start lisinopril 5 mg as well. Appreciate discussion with Dr. Caryl Comes, EP. Discussed ICD. At this point, continuing with medical therapy and reassessment of EF in the future. I did contact Dr. Servando Snare regarding his esophageal cancer and it is so remote that very low likelihood of recurrence at this point. His 5-10 year survival regarding lung cancer is also reasonable.  6. AAA  - Dr. Trula Slade following.  Followup in one month, check basic metabolic profile at that time given initiation of low-dose lisinopril. Blood pressure should be able to tolerate at this point.  [name], [place]  09/13/2014 8:42 AM

## 2014-09-13 NOTE — Patient Instructions (Signed)
Please start Lisinopril 5 mg a day.  Continue all other medications as listed.  Follow up with Dr Marlou Porch in 1 month with lab work the same day.  Please may eat and drink as normal this day.

## 2014-09-16 ENCOUNTER — Ambulatory Visit: Payer: Medicare Other | Admitting: Cardiology

## 2014-10-09 IMAGING — CT CT CHEST W/ CM
2 of 3 series · 16 of 31 positions shown, 18 images · IV contrast (75CC OMNI 300)
Comparison: Radiographs 09/02/2013. PET-CT 07/07/2013 and chest CT
07/02/2013.

CLINICAL DATA: History of left lung and esophageal cancer with
surgery, chemotherapy and radiation therapy. History of abdominal
aortic aneurysm.

EXAM:
CT CHEST WITH CONTRAST
TECHNIQUE: Multidetector CT imaging of the chest was performed during
intravenous contrast administration.
CONTRAST:  75mL OMNIPAQUE IOHEXOL 300 MG/ML  SOLN

[Series 3: chest with · axial · 0.60mm/px · z∈[-307,-47]mm · 8 of 68 slices shown, 10 images]
[im 8/68  mediastinal]
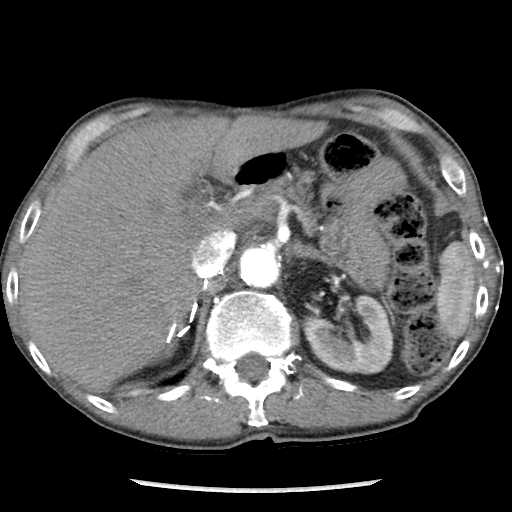
[im 8/68  lung]
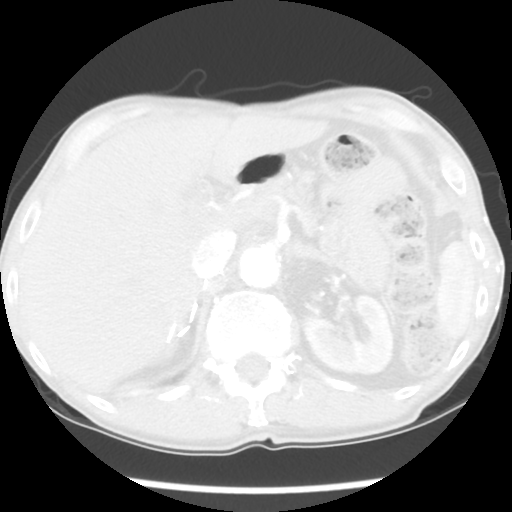
[im 15/68  lung]
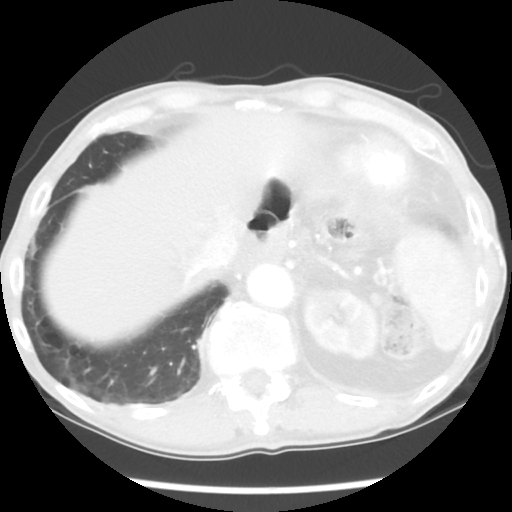
[im 23/68  lung]
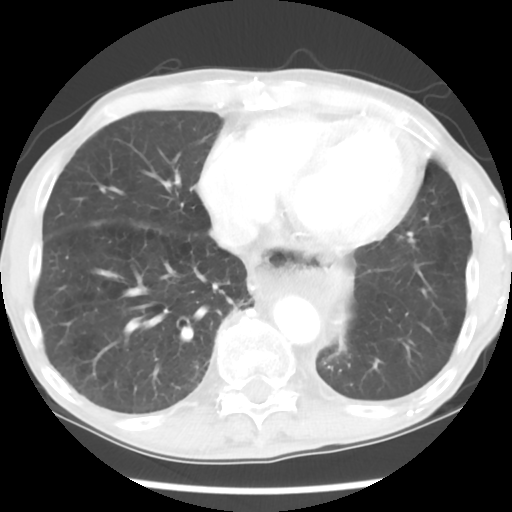
[im 30/68  lung]
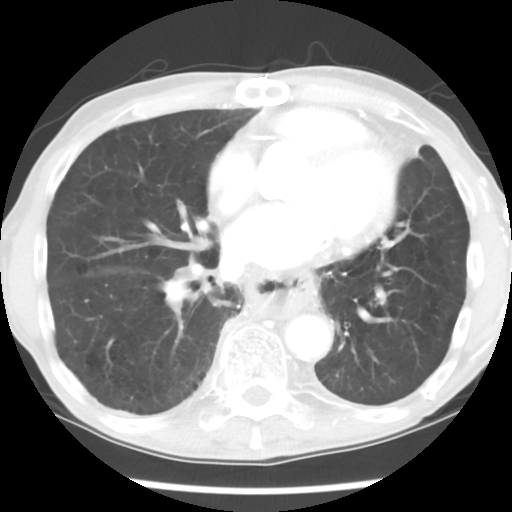
[im 38/68  mediastinal]
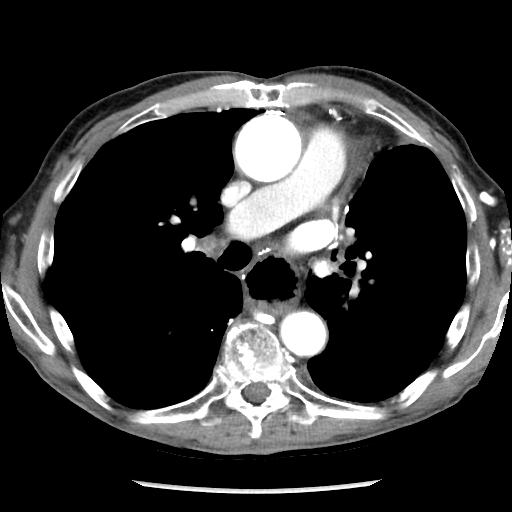
[im 38/68  lung]
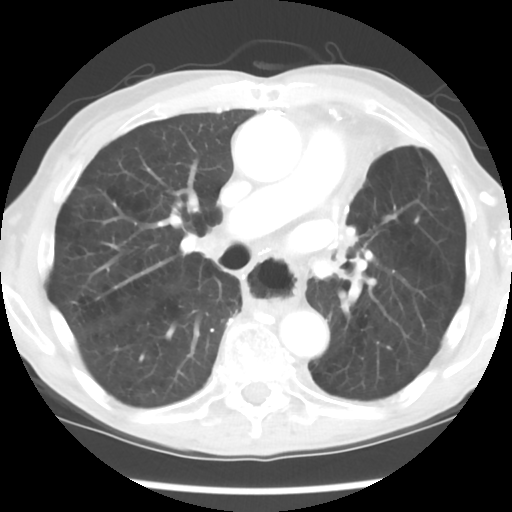
[im 45/68  lung]
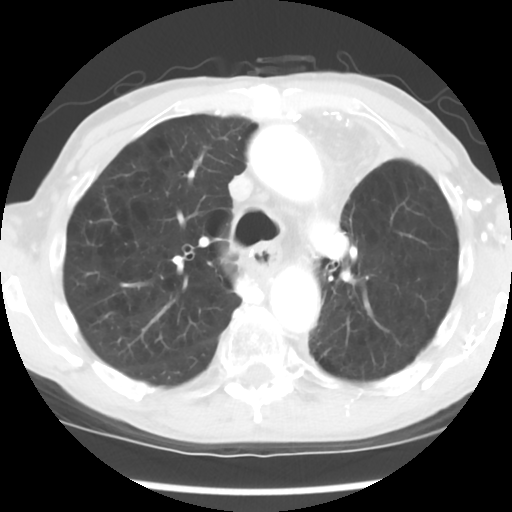
[im 53/68  lung]
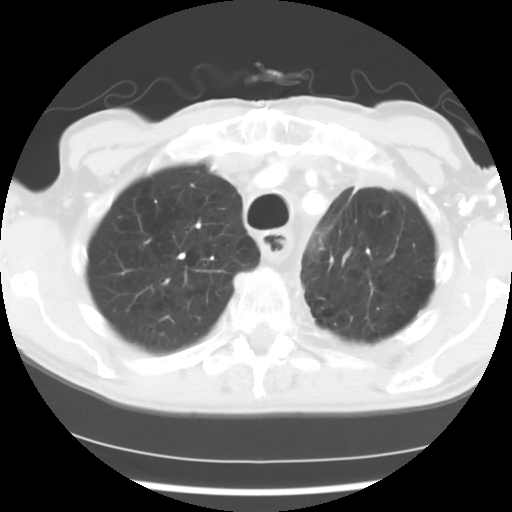
[im 60/68  lung]
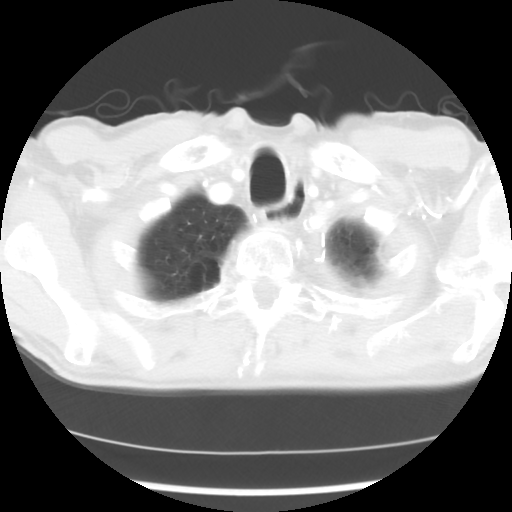

[Series 602: sagittal body · sagittal · 0.66mm/px · 8 of 124 slices shown]
[im 15/124  mediastinal]
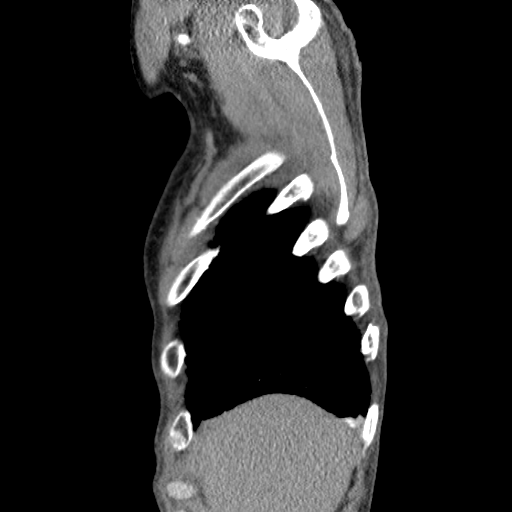
[im 29/124  mediastinal]
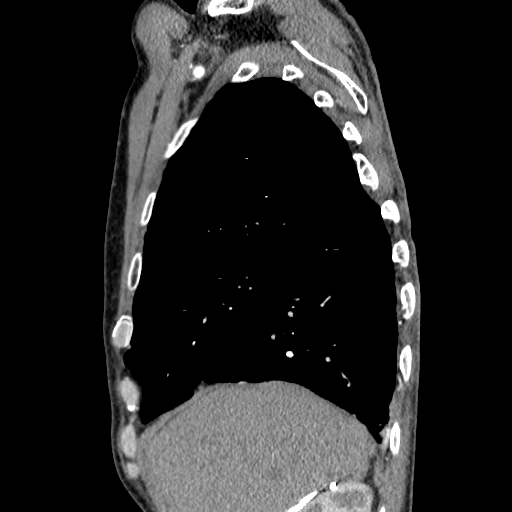
[im 44/124  mediastinal]
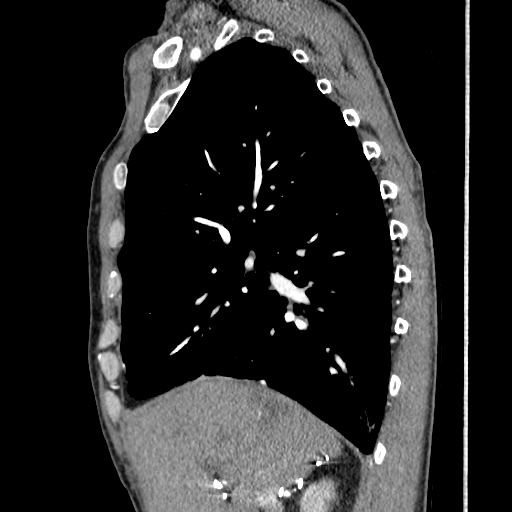
[im 58/124  mediastinal]
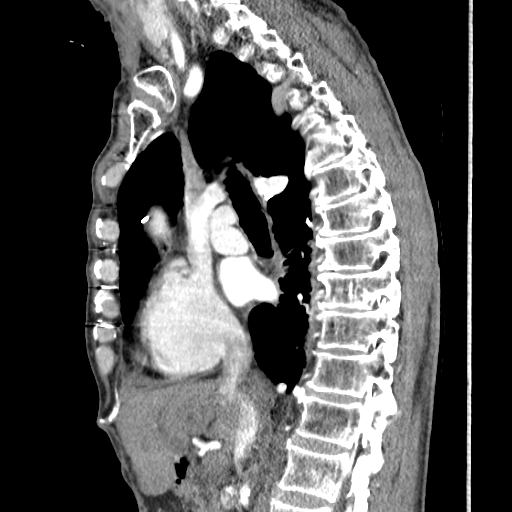
[im 66/124  mediastinal]
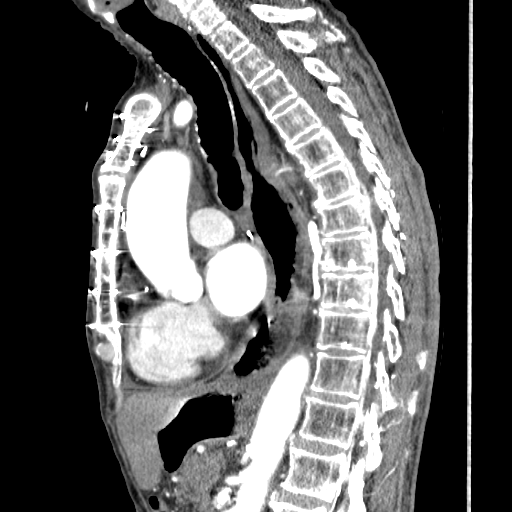
[im 80/124  mediastinal]
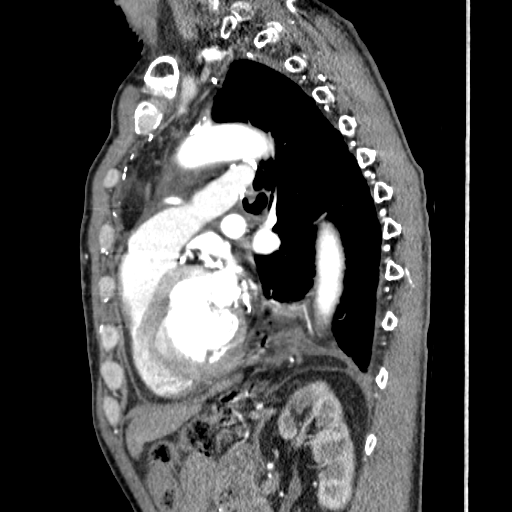
[im 95/124  mediastinal]
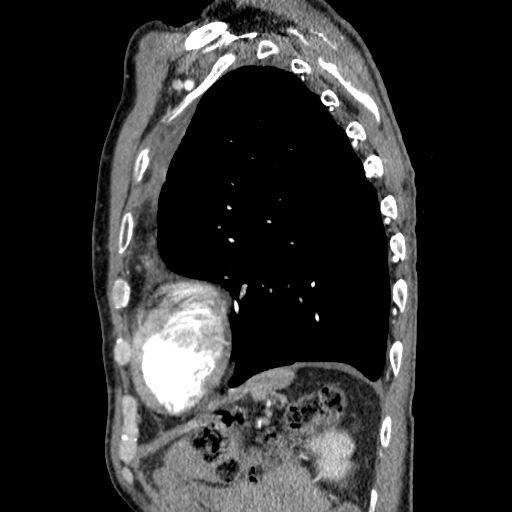
[im 109/124  mediastinal]
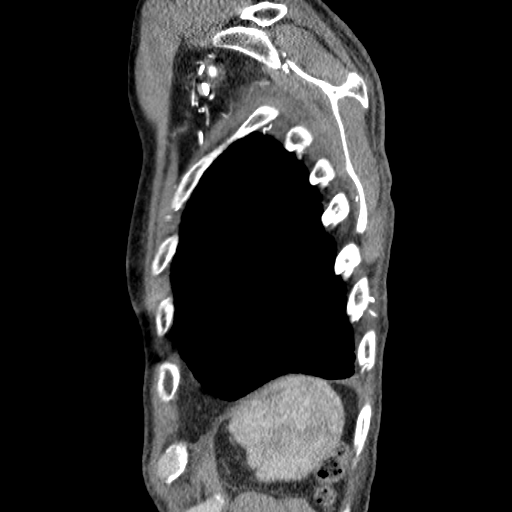

[16 of 31 positions shown; findings below may reference images not displayed]

FINDINGS: Mediastinum: There are stable postsurgical changes related to
esophagectomy with gastric pull-through. Soft tissue stranding
within the pre-vascular fat is stable. There are no enlarged
mediastinal, hilar or axillary lymph nodes. The heart size is
normal. Patient is status post median sternotomy. There is stable
atherosclerosis of the aorta, great vessels and coronary arteries.
The ascending aorta measures 4.1 cm in diameter. No focal aneurysm
demonstrated.

Lungs/Pleura: Previously demonstrated right pleural effusion has
resolved. There is mild high-density pleural nodularity near the
right posterior costophrenic angle, possibly related to previous
pleurodesis. There is no significant left pleural or pericardial
effusion. There is stable volume loss in the left hemithorax status
post left upper lobe resection. Moderate emphysematous changes are
noted. There is no suspicious pulmonary nodule or confluent airspace
opacity.

Upper abdomen: There are postsurgical changes in the right
subphrenic area, possibly from previous adrenalectomy. The
gallbladder surgically absent. No acute findings demonstrated.

Musculoskeletal/Chest wall: No acute or suspicious osseous findings.
Thoracic spine degenerative changes and Schmorl's nodes are grossly
stable. Thoracotomy changes noted.
IMPRESSION: 1. Stable mild dilatation of the ascending aorta status post median
sternotomy and CABG.
2. Stable postsurgical changes status post left upper lobe
resection, esophagectomy and gastric pull-through.
3. No evidence of thoracic metastatic disease.
4. Resolved right pleural effusion with findings suggesting interval
pleurodesis.
5. Emphysema.

## 2014-10-15 ENCOUNTER — Ambulatory Visit (INDEPENDENT_AMBULATORY_CARE_PROVIDER_SITE_OTHER): Payer: Medicare Other | Admitting: Cardiology

## 2014-10-15 ENCOUNTER — Encounter: Payer: Self-pay | Admitting: Cardiology

## 2014-10-15 VITALS — BP 120/70 | HR 75 | Ht 70.0 in | Wt 139.0 lb

## 2014-10-15 DIAGNOSIS — I714 Abdominal aortic aneurysm, without rupture, unspecified: Secondary | ICD-10-CM

## 2014-10-15 DIAGNOSIS — E7800 Pure hypercholesterolemia, unspecified: Secondary | ICD-10-CM

## 2014-10-15 DIAGNOSIS — I2510 Atherosclerotic heart disease of native coronary artery without angina pectoris: Secondary | ICD-10-CM

## 2014-10-15 DIAGNOSIS — Z79899 Other long term (current) drug therapy: Secondary | ICD-10-CM

## 2014-10-15 DIAGNOSIS — I5022 Chronic systolic (congestive) heart failure: Secondary | ICD-10-CM

## 2014-10-15 LAB — BASIC METABOLIC PANEL
BUN: 26 mg/dL — ABNORMAL HIGH (ref 6–23)
CO2: 26 meq/L (ref 19–32)
Calcium: 9.5 mg/dL (ref 8.4–10.5)
Chloride: 103 mEq/L (ref 96–112)
Creatinine, Ser: 1.1 mg/dL (ref 0.4–1.5)
GFR: 69.89 mL/min (ref >60.00)
GLUCOSE: 101 mg/dL — AB (ref 70–99)
Potassium: 4.9 mEq/L (ref 3.5–5.1)
SODIUM: 138 meq/L (ref 135–145)

## 2014-10-15 MED ORDER — LISINOPRIL 10 MG PO TABS: 10.0000 mg | ORAL_TABLET | Freq: Every day | ORAL | Status: DC

## 2014-10-15 MED ORDER — CARVEDILOL 6.25 MG PO TABS: 6.2500 mg | ORAL_TABLET | Freq: Two times a day (BID) | ORAL | Status: DC

## 2014-10-15 NOTE — Patient Instructions (Signed)
Please increase Lisinopril to 10 mg once a day. Increase your Carvedilol to 6.25 mg twice a day. Continue all other medications as listed.  Please have blood work today (BMP)  Follow up in 2 months with Dr Marlou Porch.

## 2014-10-15 NOTE — Progress Notes (Signed)
Patient ID: Glen Green, male   DOB: [birth year], 73 y.o.   MRN: [id number]      [address], [address] [place], [place]  [address] Phone: [phone] Fax:  [phone]  Date:  10/15/2014   ID:  Glen Green, DOB November 21, 1941, MRN [id number]  PCP:  Glen Coffin, MD    History of Present Illness: Glen Green is a 73 y.o. male here with coronary artery disease status post bypass surgery, ischemic cardiomyopathy EF 30-35% here for followup. He had emergency CABG x2 on 07/31/13 secondary to severe multivessel coronary artery disease with occluded LAD, occluded circumflex and severely decreased left ventricular dysfunction with acute myocardial infarction and cardiogenic shock.  Prolonged recovery. Postop atrial fibrillation was noted then he converted to sinus rhythm on amiodarone (no longer taking).  No syncope, no bleeding, no CP.   His ejection fraction was 25% currently 30-35%.   Cancer 2004 perspective, chemotherapy, right and left Port-A-Cath placement infection. Lung carcinoma approximately 2 years ago, Dr. Servando Green.  4 cm abdominal aortic aneurysm, Dr. Trula Green.   Consultation with Dr. Caryl Green, EP-starting heart failure medications. Tolerating carvedilol low-dose well as well as lisinopril.  Enjoying life, playing golf. Feeling well.   Wt Readings from Last 3 Encounters:  10/15/14 139 lb (63.05 kg)  09/13/14 140 lb (63.504 kg)  08/23/14 136 lb 11.2 oz (62.007 kg)     Past Medical History  Diagnosis Date  . Dyslipidemia   . History of esophageal cancer     s/p transhiatal esophagogastrectomy  . Hypothyroidism   . Cerebral aneurysm     TX. repair  1994  . History of lung cancer   . Pneumonia   . Cancer     Esophageal, adrenal gland, skin; lung  . Stroke 1995    denies residual  . 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 SBO (small bowel obstruction) 08/15/2012    History of small bowel obstruction (status post small bowel       resection, lysis  of adhesions, incidental appendectomy, and repair       of left diaphragmatic hernia  . AAA (abdominal aortic aneurysm) 08/22/2012    3.9 cm by CT  - June 2013, stable   . Postoperative atrial fibrillation 12/04/2013    Short course of amiodarone, resolved. Postop bypass  . CAD (coronary artery disease)   . Atrial fibrillation     Current Outpatient Prescriptions  Medication Sig Dispense Refill  . aspirin 81 MG tablet Take 1 tablet (81 mg total) by mouth daily. 30 tablet 0  . atorvastatin (LIPITOR) 40 MG tablet Take 1 tablet (40 mg total) by mouth daily at 6 PM. 30 tablet 5  . carvedilol (COREG) 3.125 MG tablet Take 1 tablet (3.125 mg total) by mouth 2 (two) times daily. 60 tablet 6  . guaiFENesin (MUCINEX) 600 MG 12 hr tablet Take 1,200 mg by mouth 2 (two) times daily as needed for congestion.    Marland Kitchen lisinopril (PRINIVIL,ZESTRIL) 5 MG tablet Take 1 tablet (5 mg total) by mouth daily. 30 tablet 11  . Phenyleph-CPM-DM-APAP (ALKA-SELTZER PLUS COLD & COUGH) 04-10-09-325 MG CAPS Take by mouth as needed (for colds and coughs.).      No current facility-administered medications for this visit.    Allergies:   No Known Allergies  Social History:  The patient  reports that he quit smoking about 11 years ago. His smoking use included Cigarettes. He smoked 0.00 packs per day.  He has never used smokeless tobacco. He reports that he does not drink alcohol or use illicit drugs.   ROS:  Please see the history of present illness.   Denies any bleeding, syncope, orthopnea, PND All other systems reviewed and negative.   PHYSICAL EXAM: VS:  BP 120/70 mmHg  Pulse 75  Ht 5\' 10"  (1.778 m)  Wt 139 lb (63.05 kg)  BMI 19.94 kg/m2 Thin,  in no acute distress HEENT: normal Neck: no JVD Cardiac:  normal S1, S2; RRR; no murmurBypass scar is healing well Lungs:  clear to auscultation bilaterally, no wheezing, rhonchi or rales, kyphosis noted Abd: soft, nontender, no hepatomegaly Ext: no edema Skin: warm and  dry Neuro:  CNs 2-12 intact, no focal abnormalities noted  EKG:  Normal sinus rhythm, no longer A. fib  ASSESSMENT AND PLAN:  73 year old male with coronary artery disease status post bypass emergently x2, brief postoperative atrial fibrillation, malnutrition, hyperlipidemia, ischemic cardiomyopathy ejection fraction 25-35%.  1. Coronary artery diseasex  - Overall doing very well, no complaints, no exertional anginal symptoms, Playing golf. Walking frequently. Post bypass.   2. Atrial fibrillation-postoperatively  - Currently in normal sinus rhythm, maintaining  - I have discontinued his amiodarone.  3. Hyperlipidemia  - Continue with statin,40 mg once a day. He is going to have this checked at his physical  4. Malnutrition - Continue to increase weight. Ensure would be helpful.  5. Ischemic cardiomyopathy -Ejection fraction was 25% on 07/31/13 in the setting of his cardiogenic shock. Currently 30-35%. Previously, unable to utilize beta blocker or ACE inhibitor because of hypotension. Now on low-dose carvedilol 3.125 twice a day and lisinopril 5 mg as well. I'm going to increase both of these to 6.25 and 10 mg respectively. I'm going to check a basic metabolic profile. I will see him back in 2 months. He will let me know if he is having any adverse side effects. Appreciate discussion with Dr. Caryl Green, EP. Discussed ICD. At this point, continuing with medical therapy and reassessment of EF in the future. I did contact Dr. Servando Green regarding his esophageal cancer and it is so remote that very low likelihood of recurrence at this point. His 5-10 year survival regarding lung cancer is also reasonable.  6. AAA  - Dr. Trula Green following. 4cm stable.   Followup in two month, check basic metabolic profile today. Blood pressure and heart rate should be able to tolerate at this point.  Glen Green, [place]  10/15/2014 8:50 AM

## 2014-10-19 ENCOUNTER — Telehealth: Payer: Self-pay | Admitting: Cardiology

## 2014-10-19 NOTE — Telephone Encounter (Signed)
Follow Up        Pt returning phone call from [place] in regards to lab results. Please call pt back and advise.

## 2014-10-19 NOTE — Telephone Encounter (Signed)
Reviewed results with pt who states understanding.

## 2014-11-18 ENCOUNTER — Encounter (HOSPITAL_COMMUNITY): Payer: Self-pay | Admitting: Cardiology

## 2014-12-17 ENCOUNTER — Ambulatory Visit (INDEPENDENT_AMBULATORY_CARE_PROVIDER_SITE_OTHER): Payer: Medicare Other | Admitting: Cardiology

## 2014-12-17 ENCOUNTER — Encounter: Payer: Self-pay | Admitting: Cardiology

## 2014-12-17 VITALS — BP 120/68 | HR 70 | Ht 70.0 in | Wt 141.0 lb

## 2014-12-17 DIAGNOSIS — I5022 Chronic systolic (congestive) heart failure: Secondary | ICD-10-CM

## 2014-12-17 DIAGNOSIS — I4891 Unspecified atrial fibrillation: Secondary | ICD-10-CM

## 2014-12-17 DIAGNOSIS — E7800 Pure hypercholesterolemia, unspecified: Secondary | ICD-10-CM

## 2014-12-17 DIAGNOSIS — I2510 Atherosclerotic heart disease of native coronary artery without angina pectoris: Secondary | ICD-10-CM

## 2014-12-17 DIAGNOSIS — I9789 Other postprocedural complications and disorders of the circulatory system, not elsewhere classified: Secondary | ICD-10-CM

## 2014-12-17 MED ORDER — LISINOPRIL 10 MG PO TABS: 10.0000 mg | ORAL_TABLET | Freq: Every day | ORAL | Status: DC

## 2014-12-17 NOTE — Patient Instructions (Signed)
Please increase your Lisinopril to 10 mg a day. Check your BP in 1 week.  If it is above 301 systolic - increase Carvedilol to 12.5 mg twice a day.  Please let me know if you do this for a new prescription. Continue all other medications as listed.  Follow up in 4 months with Dr Marlou Porch.  Thank you for choosing [facility]!!

## 2014-12-17 NOTE — Progress Notes (Signed)
Patient ID: Glen Green, male   DOB: [birth year], 74 y.o.   MRN: [id number]      [address], [address] [place], [place]  [address] Phone: [phone] Fax:  [phone]  Date:  12/17/2014   ID:  Glen Green, DOB [birth year], MRN [id number]  PCP:  Glen Coffin, MD    History of Present Illness: Glen Green is a 74 y.o. male here with coronary artery disease status post bypass surgery, ischemic cardiomyopathy EF 30-35% here for followup. He had emergency CABG x2 on 07/31/13 secondary to severe multivessel coronary artery disease with occluded LAD, occluded circumflex and severely decreased left ventricular dysfunction with acute myocardial infarction and cardiogenic shock.  Prolonged recovery. Postop atrial fibrillation was noted then he converted to sinus rhythm on amiodarone (no longer taking).  No syncope, no bleeding, no CP.   His ejection fraction was 25% currently 30-35%.   Cancer 2004 perspective, chemotherapy, right and left Port-A-Cath placement infection. Lung carcinoma approximately 2 years ago, Dr. Servando Green.  4 cm abdominal aortic aneurysm, Dr. Trula Green.   Consultation with Dr. Caryl Green, EP-starting heart failure medications. Tolerating carvedilol low-dose well as well as lisinopril.  Enjoying life, playing golf. Feeling well. No current complaints, no syncope. Cancer free.   Wt Readings from Last 3 Encounters:  12/17/14 141 lb (63.957 kg)  10/15/14 139 lb (63.05 kg)  09/13/14 140 lb (63.504 kg)     Past Medical History  Diagnosis Date  . Dyslipidemia   . History of esophageal cancer     s/p transhiatal esophagogastrectomy  . Hypothyroidism   . Cerebral aneurysm     [place]. repair  1994  . History of lung cancer   . Pneumonia   . Cancer     Esophageal, adrenal gland, skin; lung  . Stroke 1995    denies residual  . 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 SBO (small bowel obstruction) 08/15/2012    History of small bowel obstruction (status  post small bowel       resection, lysis of adhesions, incidental appendectomy, and repair       of left diaphragmatic hernia  . AAA (abdominal aortic aneurysm) 08/22/2012    3.9 cm by CT  - June 2013, stable   . Postoperative atrial fibrillation 12/04/2013    Short course of amiodarone, resolved. Postop bypass  . CAD (coronary artery disease)   . Atrial fibrillation     Current Outpatient Prescriptions  Medication Sig Dispense Refill  . aspirin 81 MG tablet Take 1 tablet (81 mg total) by mouth daily. 30 tablet 0  . atorvastatin (LIPITOR) 40 MG tablet Take 1 tablet (40 mg total) by mouth daily at 6 PM. 30 tablet 5  . carvedilol (COREG) 6.25 MG tablet Take 1 tablet (6.25 mg total) by mouth 2 (two) times daily. 60 tablet 6  . guaiFENesin (MUCINEX) 600 MG 12 hr tablet Take 1,200 mg by mouth 2 (two) times daily as needed for congestion.    Marland Kitchen lisinopril (PRINIVIL,ZESTRIL) 5 MG tablet   11  . Phenyleph-CPM-DM-APAP (ALKA-SELTZER PLUS COLD & COUGH) 04-10-09-325 MG CAPS Take by mouth as needed (for colds and coughs.).      No current facility-administered medications for this visit.    Allergies:   No Known Allergies  Social History:  The patient  reports that he quit smoking about 12 years ago. His smoking use included Cigarettes. He smoked 0.00 packs per day. He has never used  smokeless tobacco. He reports that he does not drink alcohol or use illicit drugs.   ROS:  Please see the history of present illness.   Denies any bleeding, syncope, orthopnea, PND All other systems reviewed and negative.   PHYSICAL EXAM: VS:  BP 120/68 mmHg  Pulse 70  Ht 5\' 10"  (1.778 m)  Wt 141 lb (63.957 kg)  BMI 20.23 kg/m2 Thin,  in no acute distress HEENT: normal Neck: no JVD Cardiac:  normal S1, S2; RRR; no murmurBypass scar is healing well Lungs:  clear to auscultation bilaterally, no wheezing, rhonchi or rales, kyphosis noted Abd: soft, nontender, no hepatomegaly Ext: no edema Skin: warm and dry Neuro:   CNs 2-12 intact, no focal abnormalities noted  EKG:  Normal sinus rhythm, no longer A. fib ECHO: 7/15 - EF 35% Labs: Creatinine 1.10 October 2014  ASSESSMENT AND PLAN:  74 year old male with coronary artery disease status post bypass emergently x2, brief postoperative atrial fibrillation, malnutrition, hyperlipidemia, ischemic cardiomyopathy ejection fraction 25-35%.  1. Coronary artery diseasex  - Overall doing very well, no complaints, no exertional anginal symptoms, Playing golf. Walking frequently. Post bypass.   2. Atrial fibrillation-postoperatively  - Currently in normal sinus rhythm, maintaining  - I have discontinued his amiodarone.  3. Hyperlipidemia  - Continue with statin,40 mg once a day. He is going to have this checked at his physical  4. Malnutrition - Continue to increase weight. Ensure would be helpful.  5. Ischemic cardiomyopathy -Ejection fraction was 25% on 07/31/13 in the setting of his cardiogenic shock. Currently 30-35%. Previously, unable to utilize beta blocker or ACE inhibitor because of hypotension.Increasing lisinopril from 5-10 mg once a day and if that is tolerated after one week, he will call with blood pressure, we will increase carvedilol to 12.5 g twice a day. We discussed goals of therapy. Appreciate discussion with Dr. Caryl Green, EP. Discussed ICD. At this point, continuing with medical therapy and reassessment of EF in the future. I did previously contact Dr. Servando Green regarding his esophageal cancer and it is so remote that very low likelihood of recurrence at this point. His 5-10 year survival regarding lung cancer is also reasonable.  6. AAA  - Dr. Trula Green following. 4cm stable.   Followup in 4 months. Blood pressure and heart rate should be able to tolerate at this point.  Glen Green, [place]  12/17/2014 8:46 AM

## 2014-12-24 ENCOUNTER — Telehealth: Payer: Self-pay | Admitting: Cardiology

## 2014-12-24 MED ORDER — CARVEDILOL 12.5 MG PO TABS: 12.5000 mg | ORAL_TABLET | Freq: Two times a day (BID) | ORAL | Status: DC

## 2014-12-24 MED ORDER — LISINOPRIL 5 MG PO TABS: 5.0000 mg | ORAL_TABLET | Freq: Every day | ORAL | Status: DC

## 2014-12-24 NOTE — Telephone Encounter (Signed)
Per pt call - states he has been having diarrhea off and on for the last week since increase the Lisinopril to 10 mg a day.  Advised I will notify Dr Marlou Porch and let him know of any medication changes.  Pt states understanding.

## 2014-12-24 NOTE — Telephone Encounter (Signed)
New MEssage  Pt c/o BP issue:  1. What are your last 5 BP readings?  Only had one reading-  1/15 - 110/66  2. Are you having any other symptoms (ex. Dizziness, headache, blurred vision, passed out)? Slight diarrhea.  3. What is your medication issue? Increase of BP medication   Pt requested to speak w/ Rn. Please call back.

## 2014-12-24 NOTE — Telephone Encounter (Signed)
Lm to cb to discuss issues

## 2014-12-24 NOTE — Telephone Encounter (Signed)
Reviewed with Dr Marlou Porch who gave orders to decrease Lisinopril to 5 mg wait a few days for diarrhea to clear up then increase carvedilol to 12.5 mg BID.  Reviewed with pt who states understanding.  He will follow instructions,  Check BPs and follow up as instructed  He will call back if further concerns.

## 2015-01-07 ENCOUNTER — Telehealth: Payer: Self-pay | Admitting: Cardiology

## 2015-01-07 NOTE — Telephone Encounter (Signed)
Error

## 2015-01-11 ENCOUNTER — Telehealth: Payer: Self-pay | Admitting: Cardiology

## 2015-01-11 NOTE — Telephone Encounter (Signed)
New Msg         Pt states he is returning a call from one week ago.    Please return pt call.

## 2015-01-12 NOTE — Telephone Encounter (Signed)
Follow up     Pt c/o medication issue:  1. Name of Medication: lisinopril; carvedilol  2. How are you currently taking this medication (dosage and times per day)? 10mg  daily; 6.25 bid  3. Are you having a reaction (difficulty breathing--STAT)? no  4. What is your medication issue? Pt is having diarrhea when he doubles the medication

## 2015-01-12 NOTE — Telephone Encounter (Signed)
Spoke with pt who reports he did decrease the Lisinopril back to 5 mg a day for a few days and felt better.  He then started Lisinopril 10 mg and increased his Carvedilol to 12.5 mg BID.  Pt had been advised to only increase the Carvedilol to 12.5 mg BID and continue the Lisinopril at 5 mg if the diarrhea resolved.  He will decrease the Lisinopril to 5 mg as instructed and call back if further issues or concerns.

## 2015-01-28 ENCOUNTER — Other Ambulatory Visit: Payer: Self-pay | Admitting: *Deleted

## 2015-01-28 MED ORDER — LISINOPRIL 5 MG PO TABS: 5.0000 mg | ORAL_TABLET | Freq: Every day | ORAL | Status: DC

## 2015-01-31 ENCOUNTER — Other Ambulatory Visit: Payer: Self-pay | Admitting: *Deleted

## 2015-01-31 MED ORDER — CARVEDILOL 12.5 MG PO TABS: 12.5000 mg | ORAL_TABLET | Freq: Two times a day (BID) | ORAL | Status: DC

## 2015-03-09 ENCOUNTER — Other Ambulatory Visit: Payer: Self-pay | Admitting: Cardiology

## 2015-04-25 ENCOUNTER — Ambulatory Visit (INDEPENDENT_AMBULATORY_CARE_PROVIDER_SITE_OTHER): Payer: Medicare Other | Admitting: Cardiology

## 2015-04-25 ENCOUNTER — Encounter: Payer: Self-pay | Admitting: Cardiology

## 2015-04-25 VITALS — BP 112/82 | HR 64 | Ht 70.0 in | Wt 139.0 lb

## 2015-04-25 DIAGNOSIS — I5022 Chronic systolic (congestive) heart failure: Secondary | ICD-10-CM | POA: Diagnosis not present

## 2015-04-25 DIAGNOSIS — I10 Essential (primary) hypertension: Secondary | ICD-10-CM | POA: Diagnosis not present

## 2015-04-25 DIAGNOSIS — I2510 Atherosclerotic heart disease of native coronary artery without angina pectoris: Secondary | ICD-10-CM | POA: Diagnosis not present

## 2015-04-25 NOTE — Patient Instructions (Signed)
Medication Instructions:  Continue current medications as listed.  Follow-Up: Follow up in 6 months with Dr. Marlou Porch.  You will receive a letter in the mail 2 months before you are due.  Please call us when you receive this letter to schedule your follow up appointment.  Thank you for choosing [facility]!!

## 2015-04-25 NOTE — Progress Notes (Signed)
Patient ID: Glen [name], [name]   DOB: October 09, 1941, 74 y.o.   MRN: [id number]      [address] [place], [place]  [address] Phone: [phone] Fax:  [phone]  Date:  04/25/2015   ID:  Glen [name], DOB 10-Apr-1941, MRN [id number]  PCP:  Donnie Coffin, MD    History of Present Illness: Glen [name] is a 74 y.o. [name] here with coronary artery disease status post bypass surgery, ischemic cardiomyopathy EF 30-35% here for followup. He had emergency CABG x2 on 07/31/13 secondary to severe multivessel coronary artery disease with occluded LAD, occluded circumflex and severely decreased left ventricular dysfunction with acute myocardial infarction and cardiogenic shock.  Prolonged recovery. Postop atrial fibrillation was noted then he converted to sinus rhythm on amiodarone (no longer taking).  No syncope, no bleeding, no CP.   His ejection fraction was 25% currently 30-35%.   Cancer 2004 perspective, chemotherapy, right and left Port-A-Cath placement infection. Lung carcinoma approximately 2 years ago, Dr. Servando Snare.  4 cm abdominal aortic aneurysm, Dr. Trula Slade.   Consultation with Dr. Caryl Comes, EP-starting heart failure medications. Tolerating carvedilol low-dose well as well as lisinopril.  Enjoying life, playing golf. Feeling well. No current complaints, no syncope. Cancer free. His wife asked about nose, bluish pigmentation. We discussed that amiodarone can cause this however he has not been on this drug for quite some time. It would be unlikely. He likely has a photosensitivity however and I encouraged sunscreen.   Wt Readings from Last 3 Encounters:  04/25/15 139 lb (63.05 kg)  12/17/14 141 lb (63.957 kg)  10/15/14 139 lb (63.05 kg)     Past Medical History  Diagnosis Date  . Dyslipidemia   . History of esophageal cancer     s/p transhiatal esophagogastrectomy  . Hypothyroidism   . Cerebral aneurysm     TX. repair  1994  . History of lung cancer   . Pneumonia    . Cancer     Esophageal, adrenal gland, skin; lung  . Stroke 1995    denies residual  . 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 SBO (small bowel obstruction) 08/15/2012    History of small bowel obstruction (status post small bowel       resection, lysis of adhesions, incidental appendectomy, and repair       of left diaphragmatic hernia  . AAA (abdominal aortic aneurysm) 08/22/2012    3.9 cm by CT  - June 2013, stable   . Postoperative atrial fibrillation 12/04/2013    Short course of amiodarone, resolved. Postop bypass  . CAD (coronary artery disease)   . Atrial fibrillation     Current Outpatient Prescriptions  Medication Sig Dispense Refill  . aspirin 81 MG tablet Take 1 tablet (81 mg total) by mouth daily. 30 tablet 0  . atorvastatin (LIPITOR) 40 MG tablet TAKE 1 TABLET BY MOUTH EVERY DAY AT 6PM 90 tablet 0  . carvedilol (COREG) 12.5 MG tablet Take 1 tablet (12.5 mg total) by mouth 2 (two) times daily. 90 tablet 4  . guaiFENesin (MUCINEX) 600 MG 12 hr tablet Take 1,200 mg by mouth 2 (two) times daily as needed for congestion.    Marland Kitchen lisinopril (PRINIVIL,ZESTRIL) 5 MG tablet Take 1 tablet (5 mg total) by mouth daily. 90 tablet 0   No current facility-administered medications for this visit.    Allergies:   No Known Allergies  Social History:  The patient  reports that he quit smoking about 12 years ago. His smoking use included Cigarettes. He has never used smokeless tobacco. He reports that he does not drink alcohol or use illicit drugs.   ROS:  Please see the history of present illness.   Denies any bleeding, syncope, orthopnea, PND All other systems reviewed and negative.   PHYSICAL EXAM: VS:  BP 112/82 mmHg  Pulse 64  Ht '5\' 10"'$  (1.778 m)  Wt 139 lb (63.05 kg)  BMI 19.94 kg/m2 Thin,  in no acute distress HEENT: normal Neck: no JVD Cardiac:  normal S1, S2; RRR; no murmurBypass scar  healing well Lungs:  clear to auscultation bilaterally, no wheezing,  rhonchi or rales, kyphosis noted Abd: soft, nontender, no hepatomegaly Ext: no edema Skin: warm and dry Neuro:  CNs 2-12 intact, no focal abnormalities noted  EKG:  Normal sinus rhythm, no longer A. fib ECHO: 7/15 - EF 35% Labs: Creatinine 1.10 October 2014  ASSESSMENT AND PLAN:  74 year old [name] with coronary artery disease status post bypass emergently x2, brief postoperative atrial fibrillation, malnutrition, hyperlipidemia, ischemic cardiomyopathy ejection fraction 25-35%.  1. Coronary artery diseasex  - Overall doing very well, no complaints, no exertional anginal symptoms, Playing golf. Walking frequently. Post bypass. Mild tired in afternoon. Slows down.   2. Atrial fibrillation-postoperatively  - Currently in normal sinus rhythm, maintaining  - I have discontinued his amiodarone in the past.  3. Hyperlipidemia  - Continue with statin,40 mg once a day. He is going to have this checked at his physical  4. Malnutrition - Continue to increase weight. Ensure would be helpful.  5. Ischemic cardiomyopathy -Ejection fraction was 25% on 07/31/13 in the setting of his cardiogenic shock. Currently 30-35% at last check. Previously, unable to utilize beta blocker or ACE inhibitor because of hypotension. Lisinopril from 5 mg once a day, tolerating carvedilol to 12.5 g twice a day. We discussed goals of therapy. Appreciate discussion with Dr. Caryl Comes, EP. Discussed ICD. At this point, continuing with medical therapy. I did previously contact Dr. Servando Snare regarding his esophageal cancer and it is so remote that very low likelihood of recurrence at this point. His 5-10 year survival regarding lung cancer is also reasonable.  6. AAA  - Dr. Trula Slade following. 4cm stable.   Followup in 6 months.   [name], [place]  04/25/2015 8:35 AM

## 2015-06-02 ENCOUNTER — Other Ambulatory Visit: Payer: Self-pay | Admitting: Cardiology

## 2015-06-02 ENCOUNTER — Other Ambulatory Visit: Payer: Self-pay

## 2015-06-02 MED ORDER — ATORVASTATIN CALCIUM 40 MG PO TABS: ORAL_TABLET | ORAL | Status: DC

## 2015-06-06 ENCOUNTER — Other Ambulatory Visit: Payer: Self-pay

## 2015-06-23 ENCOUNTER — Other Ambulatory Visit: Payer: Self-pay | Admitting: Cardiothoracic Surgery

## 2015-06-23 DIAGNOSIS — Z85118 Personal history of other malignant neoplasm of bronchus and lung: Secondary | ICD-10-CM

## 2015-07-28 ENCOUNTER — Ambulatory Visit
Admission: RE | Admit: 2015-07-28 | Discharge: 2015-07-28 | Disposition: A | Discharge status: Home / Self Care | Payer: Medicare Other | Source: Ambulatory Visit | Attending: Cardiothoracic Surgery | Admitting: Cardiothoracic Surgery

## 2015-07-28 ENCOUNTER — Encounter: Payer: Self-pay | Admitting: Cardiothoracic Surgery

## 2015-07-28 ENCOUNTER — Ambulatory Visit (INDEPENDENT_AMBULATORY_CARE_PROVIDER_SITE_OTHER): Payer: Medicare Other | Admitting: Cardiothoracic Surgery

## 2015-07-28 VITALS — BP 113/80 | HR 91 | Resp 16 | Ht 70.0 in | Wt 139.0 lb

## 2015-07-28 DIAGNOSIS — Z902 Acquired absence of lung [part of]: Secondary | ICD-10-CM

## 2015-07-28 DIAGNOSIS — Z951 Presence of aortocoronary bypass graft: Secondary | ICD-10-CM | POA: Diagnosis not present

## 2015-07-28 DIAGNOSIS — Z9889 Other specified postprocedural states: Secondary | ICD-10-CM | POA: Diagnosis not present

## 2015-07-28 DIAGNOSIS — C3412 Malignant neoplasm of upper lobe, left bronchus or lung: Secondary | ICD-10-CM

## 2015-07-28 DIAGNOSIS — Z8501 Personal history of malignant neoplasm of esophagus: Secondary | ICD-10-CM

## 2015-07-28 DIAGNOSIS — I712 Thoracic aortic aneurysm, without rupture, unspecified: Secondary | ICD-10-CM

## 2015-07-28 DIAGNOSIS — Z85118 Personal history of other malignant neoplasm of bronchus and lung: Secondary | ICD-10-CM

## 2015-07-28 NOTE — Progress Notes (Signed)
[address]       [place] [address]             [phone]                    Daelan G Kneip Stites Medical Record #[id number] Date of Birth: 12-23-1940  Referring: Myriam Jacobson, MD Primary Care: Myriam Jacobson, MD  Chief Complaint:    Chief Complaint  Patient presents with  . Routine Post Op    1 yr f/u with CT CHEST for surveillance of previous lung and esophageal cancers    History of Present Illness:    Patient is a 74 year old male who returns today with a followup CT scan ordered last year by Dr. Arlyce Dice. He has a long history of multiple different cancers and surgical resection. 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 The patient later underwent removal of the right adrenal gland for metastatic disease. 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   the patient presented with acute myocardial infarction while playing golf and underwent coronary artery bypass grafting by Dr. Cyndia Bent an emergency basis. He is tolerated this well and has returned to near normal activities.  The patient returns today with a followup CT scan to followup on his previously resected left upper lobe 4.5 cm squamous cell carcinoma  Current Activity/ Functional Status:  Patient is independent with mobility/ambulation, transfers, ADL's, IADL's.  Zubrod Score: At the time of surgery this patient's most appropriate activity status/level should be described as: '[x]'$  Normal activity, no symptoms '[]'$  Symptoms, fully ambulatory '[]'$  Symptoms, in bed less than or equal to 50% of the time '[]'$  Symptoms, in bed greater than 50% of the time but less than 100% '[]'$  Bedridden '[]'$  Moribund   Past Medical History  Diagnosis Date  .  Dyslipidemia   . History of esophageal cancer     s/p transhiatal esophagogastrectomy  . Hypothyroidism   . Cerebral aneurysm     [place]. repair  1994  . History of lung cancer   . Pneumonia   . Cancer     Esophageal, adrenal gland, skin; lung  . Stroke 1995    denies residual  . 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 SBO (small bowel obstruction) 08/15/2012    History of small bowel obstruction (status post small bowel       resection, lysis of adhesions, incidental appendectomy, and repair       of left diaphragmatic hernia  . AAA (abdominal aortic aneurysm) 08/22/2012    3.9 cm by CT  - June 2013, stable   . Postoperative atrial fibrillation 12/04/2013    Short course of amiodarone, resolved. Postop bypass  . CAD (coronary artery disease)   . Atrial fibrillation     Past Surgical History  Procedure Laterality Date  . Left upper lobectomy with node dissection  02/24/2010    [place]  . Exploratory laparotomy, lysis of adhesions, reduce of incarcerated small bowel and colon from the chest, limited small bowel resection, incidental appendectomy, and then repair of diaphragmatic hernia with alloderm mesh  10/27/2009    [place]  . Left subclavian port- a-cath insertion  03/09/2004    [place]  . Exploratory laparotomy,exploratory thoracotomy for  hemorrhage  02/11/2003    [place]  . Transhiatal esophagectomy with cholecystectomy, jejunostomy,  pyloroplasty and removal of right subclavian port-a- cath      [facility]  . Brain surgery      brain aneursyn  . Tonsillectomy    . Cholecystectomy    . Coronary artery bypass graft N/A 07/30/2013    Procedure: CORONARY ARTERY BYPASS GRAFTING (CABG) times two on pump using left internal mammary artery and left greater saphenous vein via endovein harvest.;  Surgeon: Gaye Pollack, MD;  Location: [facility] OR;  Service: Open Heart Surgery;  Laterality: N/A;  . Left heart catheterization with coronary angiogram N/A 07/30/2013    Procedure: LEFT  HEART CATHETERIZATION WITH CORONARY ANGIOGRAM;  Surgeon: Peter M Martinique, MD;  Location: [facility] CATH LAB;  Service: Cardiovascular;  Laterality: N/A;    Family History  Problem Relation Age of Onset  . Aneurysm Mother   . Kidney disease Father   . Heart disease Maternal Uncle   . Heart disease Paternal Uncle   . Aneurysm Maternal Grandmother   . Diabetes Sister     History   Social History  . Marital Status: Married    Spouse Name: N/A    Number of Children: N/A  . Years of Education: N/A   Occupational History  . retired    Social History Main Topics  . Smoking status: Former Smoker    Types: Cigarettes    Quit date: 12/10/2002  . Smokeless tobacco: Never Used  . Alcohol Use: No  . Drug Use: No  . Sexually Active: Not Currently   .   History  Smoking status  . Former Smoker  . Types: Cigarettes  . Quit date: 12/10/2002  Smokeless tobacco  . Never Used    History  Alcohol Use No     No Known Allergies  Current Outpatient Prescriptions  Medication Sig Dispense Refill  . aspirin 81 MG tablet Take 1 tablet (81 mg total) by mouth daily. 30 tablet 0  . atorvastatin (LIPITOR) 40 MG tablet TAKE 1 TABLET BY MOUTH EVERY DAY AT 6PM 90 tablet 3  . carvedilol (COREG) 12.5 MG tablet Take 1 tablet (12.5 mg total) by mouth 2 (two) times daily. 90 tablet 4  . guaiFENesin (MUCINEX) 600 MG 12 hr tablet Take 1,200 mg by mouth 2 (two) times daily as needed for congestion.    Marland Kitchen lisinopril (PRINIVIL,ZESTRIL) 5 MG tablet Take 1 tablet (5 mg total) by mouth daily. 90 tablet 0   No current facility-administered medications for this visit.       Review of Systems:     Cardiac Review of Systems: Y or N  Chest Pain [  n  ]  Resting SOB [ n  ] Exertional SOB  [n  ]  Orthopnea [n ]   Pedal Edema [ n  ]    Palpitations [ n ] Syncope  [n ]   Presyncope [n  ]  General Review of Systems: [Y] = yes [  ]=no Constitional: recent weight change [n  ]; anorexia [  ]; fatigue [n  ]; nausea [   ]; night sweats [  ]; fever [  ]; or chills [  ];  Dental: poor dentition[  ]; Last Dentist visit:   Eye : blurred vision [  ]; diplopia [   ]; vision changes [  ];  Amaurosis fugax[  ]; Resp: cough [  ];  wheezing[  ];  hemoptysis[  ]; shortness of breath[  ]; paroxysmal nocturnal dyspnea[  ]; dyspnea on exertion[  ]; or orthopnea[  ];  GI:  gallstones[  ], vomiting[  ];  dysphagia[  ]; melena[  ];  hematochezia [  ]; heartburn[  ];   Hx of  Colonoscopy[  ]; GU: kidney stones [  ]; hematuria[  ];   dysuria [  ];  nocturia[  ];  history of     obstruction [  ]; urinary frequency [  ]             Skin: rash, swelling[  ];, hair loss[  ];  peripheral edema[n  ];  or itching[  ]; Musculosketetal: myalgias[  ];  joint swelling[  ];  joint erythema[  ];  joint pain[  ];  back pain[  ];  Heme/Lymph: bruising[  ];  bleeding[  ];  anemia[  ];  Neuro: TIA[  ];  headaches[  ];  stroke[  ];  vertigo[  ];  seizures[  ];   paresthesias[  ];  difficulty walking[  ];  Psych:depression[  ]; anxiety[  ];  Endocrine: diabetes[  ];  thyroid dysfunction[  ];  Immunizations: Flu [  ]; Pneumococcal[  ];  Other:  Physical Exam: BP 113/80 mmHg  Pulse 91  Resp 16  Ht '5\' 10"'$  (1.778 m)  Wt 139 lb (63.05 kg)  BMI 19.94 kg/m2  SpO2 97%  General appearance: alert and cooperative Neurologic: intact Heart: regular rate and rhythm, S1, S2 normal, no murmur, click, rub or gallop Lungs: clear to auscultation bilaterally Abdomen: soft, non-tender; bowel sounds normal; no masses,  no organomegaly, easily palpable  by palpation remains approximately 4 cmAAA non tender Extremities: extremities normal, atraumatic, no cyanosis or edema Wound: I do not appreciate any cervical or supraclavicular adenopathy the patient's cervical neck incision is well-healed his abdominal and right thoracotomy  incisions are also well healed. 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 Status post resection. Additional history of esophageal cancer. Subsequent treatment strategy.  EXAM: CT CHEST WITHOUT CONTRAST  TECHNIQUE: Multidetector CT imaging of the chest was performed following the standard protocol without IV contrast.  COMPARISON:  CT 07/22/2014  FINDINGS: Mediastinum/Nodes: No axillary supraclavicular adenopathy. Ascending aorta dilated to 42 mm compared to 41 mm at same level on comparison CT of 07/22/2014. No mediastinal lymphadenopathy.  Postop esophagectomy and gastric pull-through anatomy. No obstructing mass identified.  Lungs/Pleura: Postsurgical change in the LEFT hemi thorax consistent with LEFT upper lobectomy. No nodularity within the LEFT lung. There is bilateral centrilobular emphysema.  Upper abdomen: Status post RIGHT adrenalectomy. LEFT adrenal glands normal. Post cholecystectomy. No acute findings on limited non contrast exam.  Musculoskeletal: No aggressive osseous lesion. Midline sternotomy noted.  IMPRESSION: 1. Stable postsurgical change consistent with LEFT upper lobectomy. No evidence of local lung cancer recurrence. 2. Patient status post esophagectomy and gastric pull-through. No complicating features. 3. Extensive centrilobular emphysema. 4. Stable ascending aorta aneurysm.   Electronically Signed   By: Helane Gunther.D.  On: 07/28/2015 11:32   Dg Chest 1 View  07/13/2013   *RADIOLOGY REPORT*  Clinical Data: Post right thoracentesis  CHEST - 1 VIEW  Comparison: 12/05/2011 chest radiograph, PET CT 07/07/2013  Findings: A bulla at the right base is incidentally re-identified. Mild fluid tracking along the right minor fissure is noted.  Small left effusion is  present.  No pneumothorax.  Left apical clips are present.  Heart size is normal.  Right upper quadrant clips. Emphysematous changes and patchy ill-defined predominately right lower lobe airspace opacity is identified.  IMPRESSION: No pneumothorax after thoracentesis.   Original Report Authenticated By: Conchita Paris, M.D.   Ct Chest Wo Contrast  07/13/2013   *RADIOLOGY REPORT*  Clinical Data: History of lung carcinoma with right pleural effusion and abnormal pleural enhancement by PET scan.  Evaluation is performed for possible pleural biopsy.  CT CHEST WITHOUT CONTRAST  Technique:  Multidetector CT imaging of the chest was performed following the standard protocol without IV contrast.  Comparison: PET scan on 07/07/2013  Findings: Imaging was performed in a prone position. There is no evidence of right-sided pleural mass.  A small pleural effusion is present which layers dependently in the anterior hemithorax when the patient is in a prone position.  Significant COPD present.  No pulmonary nodules are identified.  IMPRESSION: No evidence of pleural mass.  A small layering right-sided pleural effusion is present. An ultrasound-guided right-sided thoracentesis was subsequently performed.   Original Report Authenticated By: Aletta Edouard, M.D.   Nm Pet Image Restag (ps) Skull Base To Thigh  07/07/2013   *RADIOLOGY REPORT*  Clinical Data: Subsequent treatment strategy for esophageal cancer and lung cancer.  NUCLEAR MEDICINE PET SKULL BASE TO THIGH  Fasting Blood Glucose:  89  Technique:  15.1 mCi F-18 FDG was injected intravenously. CT data was obtained and used for attenuation correction and anatomic localization only.  (This was not acquired as a diagnostic CT examination.) Additional exam technical data entered on technologist worksheet.  Comparison:  07/02/2013  Findings:  Neck: No hypermetabolic lymph nodes in the neck.  Chest:  Postoperative change from esophagectomy and gastric pull- through identified.   A left upper lobectomy has also been performed.  There has been interval decrease in volume of bilateral pleural effusions.  There is a small focus of mild increased FDG uptake localizing to the previously described loculated right pleural effusion.  The SUV max within this area is equal to 3.0, image 134.  No hypermetabolic mediastinal or hilar nodes.  No suspicious pulmonary nodules on the CT scan.  Abdomen/Pelvis:  No abnormal hypermetabolic activity within the liver, pancreas, adrenal glands, or spleen.  No hypermetabolic lymph nodes in the abdomen or pelvis. Again noted is an abdominal aortic aneurysm.  The maximum AP dimension is equal to 3.6, image 172/series 2.  Skeleton:  No focal hypermetabolic activity to suggest skeletal metastasis.  IMPRESSION:  1.  There is a small focus of mild increased uptake localizing to the partially loculated right pleural effusion.  This is nonspecific and may reflect inflammation or infection.  Malignant pleural effusion cannot be excluded.  Consider further evaluation with diagnostic thoracentesis. 2.  Infrarenal abdominal aortic aneurysm.   Original Report Authenticated By: Kerby Moors, M.D.     Ct Chest W Contrast  07/02/2013   *RADIOLOGY REPORT*  Clinical Data: History of esophageal cancer with esophagectomy and gastric pull-through.  CT CHEST WITH CONTRAST  Technique:  Multidetector CT imaging of the chest was performed following the standard  protocol during bolus administration of intravenous contrast.  Contrast: 18m OMNIPAQUE IOHEXOL 300 MG/ML  SOLN  Comparison: 05/26/2012  Findings: The lungs are well-aerated bilaterally with emphysematous changes noted. A small right-sided pleural effusion is identified with some associated right lower lobe atelectasis.  This is new from prior exam.  No new focal infiltrate or sizable parenchymal nodule is seen.  There are changes consistent with the patient's given clinical history of esophagectomy and gastric pull-through.   An air and fluid is noted within the stomach.  No obstructive changes are seen.  The thoracic aorta and pulmonary artery are less than stable.  Mild dilatation of the proximal ascending aorta is again seen and stable.  Heavy coronary calcifications are again seen. No hilar or mediastinal adenopathy is identified.  Scanning into the upper abdomen reveals postsurgical changes.  IMPRESSION: Changes consistent with the known history of esophagectomy and gastric pull-through.  The overall appearance is stable.  No recurrent disease is seen.  Stable dilatation of the ascending aorta.  Stable emphysematous changes.   Original Report Authenticated By: MInez Catalina M.D.   Ct Abdomen W Contrast  07/02/2013   *RADIOLOGY REPORT*  Clinical Data: Follow-up for the soft medial and lung cancer.  CT ABDOMEN WITH CONTRAST  Technique:  Multidetector CT imaging of the abdomen was performed following the standard protocol during bolus administration of intravenous contrast.  Contrast:  100 ml of Omnipaque-300  Comparison: May 26, 2012  CT of the chest:  Findings: There are extensive emphysematous changes of bilateral lungs.  Stable postsurgical changes are identified involving the left hemithorax.  There are no pulmonary nodules or mass.  There is interval developed minimal left pleural effusion and moderate right pleural effusion.  Along the medial aspect of the right pleural effusion, there is suggestion of pleural based 1 cm nodular enhancement best seen on series 3 image 46.  There is no mediastinal or hilar lymphadenopathy.  The patient is status post prior gastric pull-through without interval change.  The heart size is normal.  There is no pericardial effusion.  Images of the bones demonstrate no focal discrete lytic or blastic lesion.  Impression:  Interval developed bilateral pleural effusions, right greater than left.  Along the medial aspect of the right pleural effusion, there is suggestion of a pleural based 1 cm nodule  enhancement new since prior exam.  Metastasis is not excluded. Further evaluation with right thoracentesis with cytology may be helpful if clinically indicated.  CT of the abdomen:  Findings:  Images of the abdomen demonstrate normal liver.  The patient is status post prior cholecystectomy.  There is postsurgical common bile duct dilatation measuring 1.3 cm unchanged compared prior exam.  The spleen, pancreas, left adrenal gland are normal.  There are bilateral kidneys cysts unchanged.  There is no hydronephrosis bilaterally.  There are stable postsurgical changes in the right retroperitoneum.  There are small stable small lymph nodes in the retroperitoneum periaortic region unchanged.  There is stable infrarenal abdominal aorta measuring 4.1 cm in diameter.  The visualized bowel is normal.  Images of the bones demonstrate no definite focal lytic or blastic lesions.  IMPRESSION: Stable abdomen CT without change compared prior exam.  No CT evidence of abdominal metastatic disease.   Original Report Authenticated By: WAbelardo Diesel M.D.    The patient's CT scans were read simultaneously by 2 different radiologists with conflicting reports in inconsistencies. I've talked to Dr. DArdeen Garlandwho is reviewed the CT of the chest and abdomen, he  will have the reports amended. On my interpretation The patient has a 4 cm abdominal aortic aneurysm mildly dilated ascending aorta 3.6 cm. There is a new right pleural effusion with some nodularity and enhancement suggestive of a malignant effusion.  Recent Lab Findings: Lab Results  Component Value Date   WBC 8.2 08/06/2013   HGB 10.4* 08/06/2013   HCT 30.9* 08/06/2013   PLT 340 08/06/2013   GLUCOSE 101* 10/15/2014   CHOL 92 07/31/2013   TRIG 73 07/31/2013   HDL 35* 07/31/2013   LDLCALC 42 07/31/2013   ALT 46 08/02/2013   AST 55* 08/02/2013   NA 138 10/15/2014   K 4.9 10/15/2014   CL 103 10/15/2014   CREATININE 1.1 10/15/2014   BUN 26* 10/15/2014   CO2 26  10/15/2014   TSH 1.766 07/31/2013   INR 1.52* 07/31/2013   HGBA1C 6.5* 07/30/2013    Diagnosis PLEURAL FLUID, RIGHT NO MALIGNANT CELLS IDENTIFIED. 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  Incidental finding dilatation of the ascending aorta 3.6 and 4 cm abdominal aortic aneurysm. I referred the patient to Dr. Trula Slade last year for evaluation and following of his abdominal aortic aneurysm., But the patient never went he has an appointment for September with abdominal ultrasound.  Patient has a history of cerebral aneurysm repaired 1999, both his mother and grandmother died at an early age from cerebral aneurysms.   Plan see the patient back with followup CT of the chest in 12 months  Grace Isaac MD      [address] [place] [address] Office [phone]   Beeper [phone]  07/28/2015 1:56 PM

## 2015-08-26 ENCOUNTER — Encounter: Payer: Self-pay | Admitting: Family

## 2015-08-29 ENCOUNTER — Encounter: Payer: Self-pay | Admitting: Family

## 2015-08-29 ENCOUNTER — Ambulatory Visit (INDEPENDENT_AMBULATORY_CARE_PROVIDER_SITE_OTHER): Payer: Medicare Other | Admitting: Family

## 2015-08-29 ENCOUNTER — Ambulatory Visit (HOSPITAL_COMMUNITY)
Admission: RE | Admit: 2015-08-29 | Discharge: 2015-08-29 | Disposition: A | Discharge status: Home / Self Care | Payer: Medicare Other | Source: Ambulatory Visit | Attending: Family | Admitting: Family

## 2015-08-29 VITALS — BP 105/69 | HR 63 | Temp 97.4°F | Resp 16 | Ht 70.0 in | Wt 138.0 lb

## 2015-08-29 DIAGNOSIS — I714 Abdominal aortic aneurysm, without rupture, unspecified: Secondary | ICD-10-CM

## 2015-08-29 DIAGNOSIS — I240 Acute coronary thrombosis not resulting in myocardial infarction: Secondary | ICD-10-CM | POA: Insufficient documentation

## 2015-08-29 NOTE — Patient Instructions (Signed)
Abdominal Aortic Aneurysm An aneurysm is a weakened or damaged part of an artery wall that bulges from the normal force of blood pumping through the body. An abdominal aortic aneurysm is an aneurysm that occurs in the lower part of the aorta, the main artery of the body.  The major concern with an abdominal aortic aneurysm is that it can enlarge and burst (rupture) or blood can flow between the layers of the wall of the aorta through a tear (aorticdissection). Both of these conditions can cause bleeding inside the body and can be life threatening unless diagnosed and treated promptly. CAUSES  The exact cause of an abdominal aortic aneurysm is unknown. Some contributing factors are:   A hardening of the arteries caused by the buildup of fat and other substances in the lining of a blood vessel (arteriosclerosis).  Inflammation of the walls of an artery (arteritis).   Connective tissue diseases, such as Marfan syndrome.   Abdominal trauma.   An infection, such as syphilis or staphylococcus, in the wall of the aorta (infectious aortitis) caused by bacteria. RISK FACTORS  Risk factors that contribute to an abdominal aortic aneurysm may include:  Age older than 60 years.   High blood pressure (hypertension).  Male gender.  Ethnicity (white race).  Obesity.  Family history of aneurysm (first degree relatives only).  Tobacco use. PREVENTION  The following healthy lifestyle habits may help decrease your risk of abdominal aortic aneurysm:  Quitting smoking. Smoking can raise your blood pressure and cause arteriosclerosis.  Limiting or avoiding alcohol.  Keeping your blood pressure, blood sugar level, and cholesterol levels within normal limits.  Decreasing your salt intake. In somepeople, too much salt can raise blood pressure and increase your risk of abdominal aortic aneurysm.  Eating a diet low in saturated fats and cholesterol.  Increasing your fiber intake by including  whole grains, vegetables, and fruits in your diet. Eating these foods may help lower blood pressure.  Maintaining a healthy weight.  Staying physically active and exercising regularly. SYMPTOMS  The symptoms of abdominal aortic aneurysm may vary depending on the size and rate of growth of the aneurysm.Most grow slowly and do not have any symptoms. When symptoms do occur, they may include:  Pain (abdomen, side, lower back, or groin). The pain may vary in intensity. A sudden onset of severe pain may indicate that the aneurysm has ruptured.  Feeling full after eating only small amounts of food.  Nausea or vomiting or both.  Feeling a pulsating lump in the abdomen.  Feeling faint or passing out. DIAGNOSIS  Since most unruptured abdominal aortic aneurysms have no symptoms, they are often discovered during diagnostic exams for other conditions. An aneurysm may be found during the following procedures:  Ultrasonography (A one-time screening for abdominal aortic aneurysm by ultrasonography is also recommended for all men aged 65-75 years who have ever smoked).  X-ray exams.  A computed tomography (CT).  Magnetic resonance imaging (MRI).  Angiography or arteriography. TREATMENT  Treatment of an abdominal aortic aneurysm depends on the size of your aneurysm, your age, and risk factors for rupture. Medication to control blood pressure and pain may be used to manage aneurysms smaller than 6 cm. Regular monitoring for enlargement may be recommended by your caregiver if:  The aneurysm is 3-4 cm in size (an annual ultrasonography may be recommended).  The aneurysm is 4-4.5 cm in size (an ultrasonography every 6 months may be recommended).  The aneurysm is larger than 4.5 cm in   size (your caregiver may ask that you be examined by a vascular surgeon). If your aneurysm is larger than 6 cm, surgical repair may be recommended. There are two main methods for repair of an aneurysm:   Endovascular  repair (a minimally invasive surgery). This is done most often.  Open repair. This method is used if an endovascular repair is not possible. Document Released: 09/05/2005 Document Revised: 03/23/2013 Document Reviewed: 12/26/2012 Exit[employer] Patient Information 2015 [employer]. This information is not intended to replace advice given to you by your health care provider. Make sure you discuss any questions you have with your health care provider.  

## 2015-08-29 NOTE — Addendum Note (Signed)
Addended by: Dorthula Rue L on: 08/29/2015 10:18 AM   Modules accepted: Orders

## 2015-08-29 NOTE — Progress Notes (Signed)
[facility] [place]  Established Abdominal Aortic Aneurysm  History of Present Illness  Glen Green is a 74 y.o. (16-Aug-1941) male patient of Dr. Trula Slade who returns for  Evaluation of his abdominal aortic aneurysm. This was reportedly detected in about 2010. It has remained stable in size measuring approximately 4 cm. He denies any abdominal pain or back pain.  The patient has a history of an ischemic heart disease. He presented in August of 2014 with shock and heart failure and was found to have an occluded LAD and circumflex. He underwent emergent cardiac bypass surgery. 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  He has a history of esophageal cancer, status post distal esophagectomy and 2004, followed by chemotherapy. He is also status post right adrenalectomy and left upper lobectomy in 2011, presumably for metastatic disease.  He suffers from hypercholesterolemia, treated with a statin. He has a history of smoking but quit in 2004. He also has a history of a ruptured cerebral aneurysm.  He had a CTA of his chest in August 2016 as requested by Dr. Servando Snare, results pertaining to AAA are below. The patient denies claudication in legs with walking, denies non healing wounds. The patient denies history of stroke or TIA symptoms.  Pt Diabetic: borderline Pt smoker: former smoker, quit about 2004, states he smoked for about 50 years  Past Medical History  Diagnosis Date  . Dyslipidemia   . History of esophageal cancer     s/p transhiatal esophagogastrectomy  . Hypothyroidism   . Cerebral aneurysm     TX. repair  1994  . History of lung cancer   . Pneumonia   . Cancer     Esophageal, adrenal gland, skin; lung  . Stroke 1995    denies residual  . 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 SBO (small bowel obstruction) 08/15/2012    History of small bowel obstruction (status post small bowel       resection, lysis of adhesions, incidental  appendectomy, and repair       of left diaphragmatic hernia  . AAA (abdominal aortic aneurysm) 08/22/2012    3.9 cm by CT  - June 2013, stable   . Postoperative atrial fibrillation 12/04/2013    Short course of amiodarone, resolved. Postop bypass  . CAD (coronary artery disease)   . Atrial fibrillation    Past Surgical History  Procedure Laterality Date  . Left upper lobectomy with node dissection  02/24/2010    [place]  . Exploratory laparotomy, lysis of adhesions, reduce of incarcerated small bowel and colon from the chest, limited small bowel resection, incidental appendectomy, and then repair of diaphragmatic hernia with alloderm mesh  10/27/2009    [place]  . Left subclavian port- a-cath insertion  03/09/2004    [place]  . Exploratory laparotomy,exploratory thoracotomy for hemorrhage  02/11/2003    [place]  . Transhiatal esophagectomy with cholecystectomy, jejunostomy,  pyloroplasty and removal of right subclavian port-a- cath      [facility]  . Brain surgery      brain aneursyn  . Tonsillectomy    . Cholecystectomy    . Coronary artery bypass graft N/A 07/30/2013    Procedure: CORONARY ARTERY BYPASS GRAFTING (CABG) times two on pump using left internal mammary artery and left greater saphenous vein via endovein harvest.;  Surgeon: Gaye Pollack, MD;  Location: [facility];  Service: Open Heart Surgery;  Laterality: N/A;  . Left heart catheterization with coronary angiogram N/A 07/30/2013  Procedure: LEFT HEART CATHETERIZATION WITH CORONARY ANGIOGRAM;  Surgeon: Peter M Martinique, MD;  Location: [facility] CATH LAB;  Service: Cardiovascular;  Laterality: N/A;   Social History Social History   Social History  . Marital Status: Married    Spouse Name: N/A  . Number of Children: N/A  . Years of Education: N/A   Occupational History  . retired    Social History Main Topics  . Smoking status: Former Smoker    Types: Cigarettes    Quit date: 12/10/2002  . Smokeless tobacco: Never Used  . Alcohol  Use: No  . Drug Use: No  . Sexual Activity: Not Currently   Other Topics Concern  . Not on file   Social History Narrative   Pt lives with wife.    Family History Family History  Problem Relation Age of Onset  . Aneurysm Mother   . Kidney disease Father   . Heart disease Maternal Uncle   . Heart disease Paternal Uncle   . Aneurysm Maternal Grandmother   . Diabetes Sister     Current Outpatient Prescriptions on File Prior to Visit  Medication Sig Dispense Refill  . aspirin 81 MG tablet Take 1 tablet (81 mg total) by mouth daily. 30 tablet 0  . atorvastatin (LIPITOR) 40 MG tablet TAKE 1 TABLET BY MOUTH EVERY DAY AT 6PM 90 tablet 3  . carvedilol (COREG) 12.5 MG tablet Take 1 tablet (12.5 mg total) by mouth 2 (two) times daily. 90 tablet 4  . guaiFENesin (MUCINEX) 600 MG 12 hr tablet Take 1,200 mg by mouth 2 (two) times daily as needed for congestion.    Marland Kitchen lisinopril (PRINIVIL,ZESTRIL) 5 MG tablet Take 1 tablet (5 mg total) by mouth daily. 90 tablet 0   No current facility-administered medications on file prior to visit.   No Known Allergies  ROS: See HPI for pertinent positives and negatives.  Physical Examination  Filed Vitals:   08/29/15 0908  BP: 105/69  Pulse: 63  Temp: 97.4 F (36.3 C)  TempSrc: Oral  Resp: 16  Height: '5\' 10"'$  (1.778 m)  Weight: 138 lb (62.596 kg)  SpO2: 100%   Body mass index is 19.8 kg/(m^2).  General: A&O x 3, WD, thin male.  Pulmonary: Sym exp, good air movt, CTAB, no rales, rhonchi, or wheezing.  Cardiac: RRR, Nl S1, S2, no detected murmur.   Carotid Bruits Right Left   Negative Negative   Aorta is palpable Radial pulses are 1+ palpable and =                          VASCULAR EXAM:                                                                                                         LE Pulses Right Left       FEMORAL  faintly palpable  faintly palpable        POPLITEAL  not palpable   not palpable       POSTERIOR TIBIAL  not  palpable   not palpable        DORSALIS PEDIS      ANTERIOR TIBIAL not palpable  not palpable      Gastrointestinal: soft, NTND, -G/R, - HSM, - masses palpated, - CVAT B.  Musculoskeletal: M/S 5/5 throughout, Extremities without ischemic changes.  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  07/28/15 CTA chest requested by Dr. 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 Status post resection. Additional history of esophageal cancer. Subsequent treatment strategy.  EXAM: CT CHEST WITHOUT CONTRAST  TECHNIQUE: Multidetector CT imaging of the chest was performed following the standard protocol without IV contrast.  COMPARISON: CT 07/22/2014  FINDINGS: Mediastinum/Nodes: No axillary supraclavicular adenopathy. Ascending aorta dilated to 42 mm compared to 41 mm at same level on comparison CT of 07/22/2014. No mediastinal lymphadenopathy.  Non-Invasive Vascular Imaging  AAA Duplex (08/29/2015)  Previous size: 4.06 cm (Date: 08/23/14)  Current size:  4.1 cm (Date: 08/29/2015)  Medical Decision Making  The patient is a 74 y.o. male who presents with asymptomatic AAA with no increase in size.   Based on this patient's exam and diagnostic studies, the patient will follow up in 1 year  with the following studies: AAA duplex.  Consideration for repair of AAA would be made when the size is 5.5 cm, growth > 1 cm/yr, and symptomatic status.  I emphasized the importance of maximal medical management including strict control of blood pressure, blood glucose, and lipid levels, antiplatelet agents, obtaining regular exercise, and continued cessation of smoking.   The patient was given information about AAA including signs, symptoms, treatment, and how to minimize the risk of enlargement  and rupture of aneurysms.    The patient was advised to call 911 should the patient experience sudden onset abdominal or back pain.   Thank you for allowing [place] to participate in this patient's care.  Clemon Chambers, RN, MSN, FNP-C [facility] [place] Office: [facility] Physician: Trula Slade  08/29/2015, 9:10 AM

## 2015-10-04 ENCOUNTER — Other Ambulatory Visit: Payer: Self-pay | Admitting: Cardiology

## 2015-10-15 IMAGING — CT CT CHEST W/O CM
3 of 4 series · 17 of 30 positions shown, 19 images · non-contrast
Comparison: CT 07/22/2014

CLINICAL DATA: Squamous cell carcinoma LEFT lower cirrhosis post
cell carcinoma of the LEFT upper lung. Status post resection.
Additional history of esophageal cancer. Subsequent treatment
strategy.

EXAM:
CT CHEST WITHOUT CONTRAST
TECHNIQUE: Multidetector CT imaging of the chest was performed following the
standard protocol without IV contrast.

[Series 3: chest w/o · axial · non-contrast · 0.70mm/px · z∈[-285,-50]mm · 5 of 71 slices shown, 7 images]
[im 12/71  mediastinal]
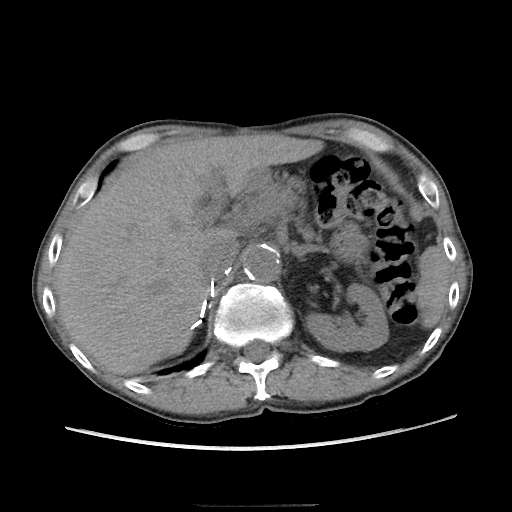
[im 12/71  lung]
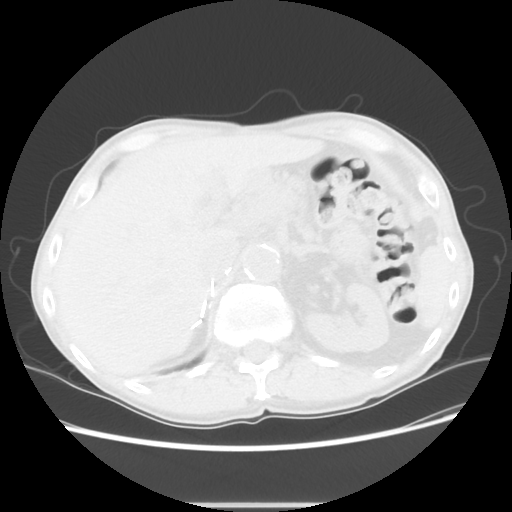
[im 24/71  lung]
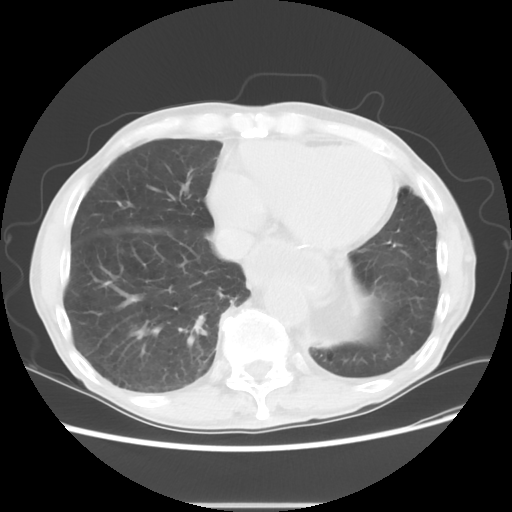
[im 36/71  lung]
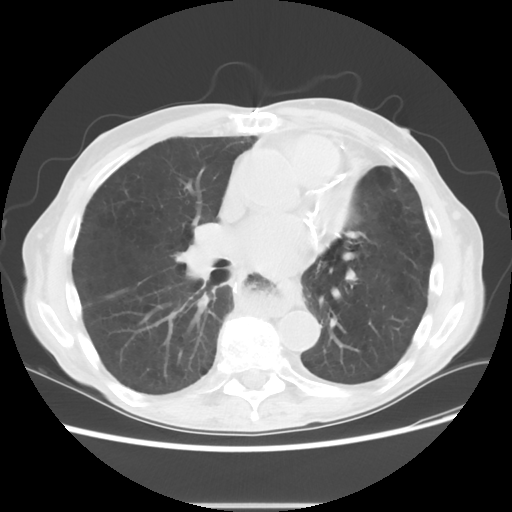
[im 47/71  lung]
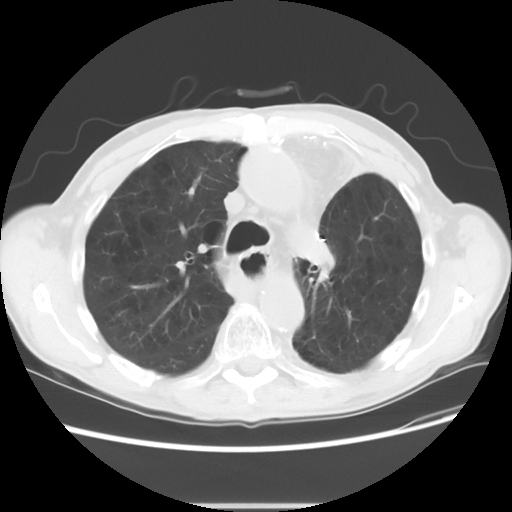
[im 59/71  mediastinal]
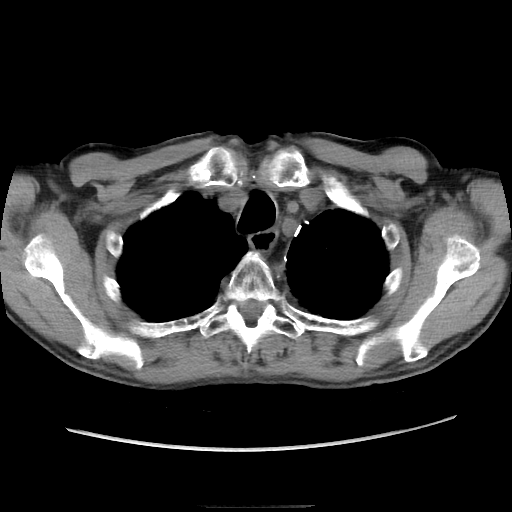
[im 59/71  lung]
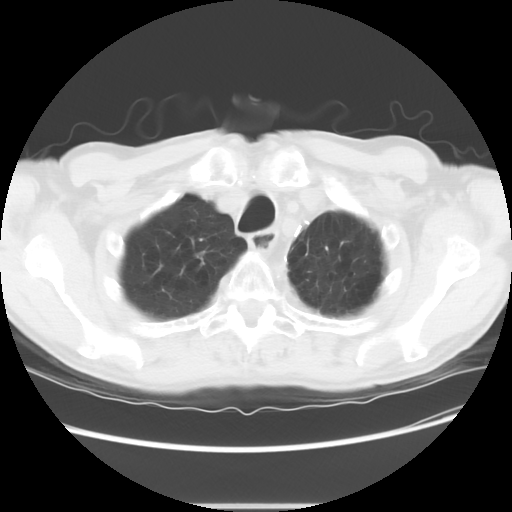

[Series 4: lung windows · axial · 0.70mm/px · z∈[-285,-50]mm · 5 of 71 slices shown]
[im 12/71  lung]
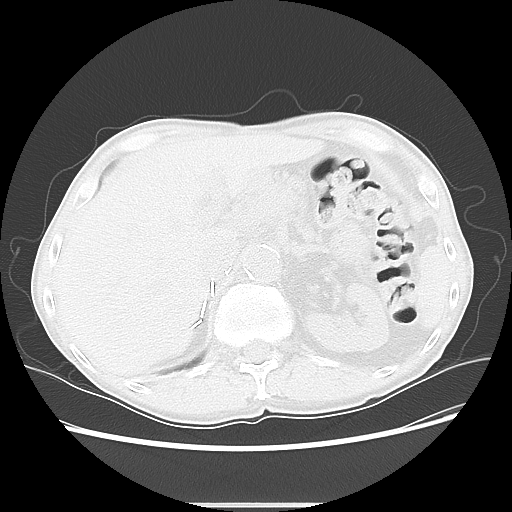
[im 24/71  lung]
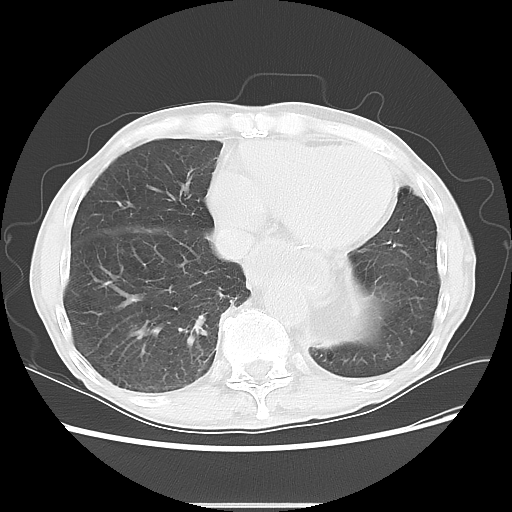
[im 36/71  lung]
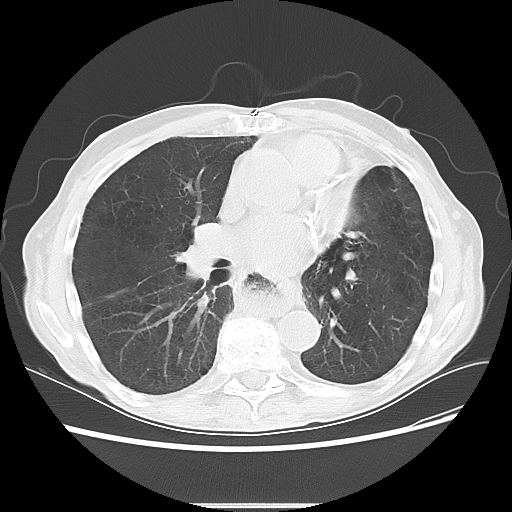
[im 47/71  lung]
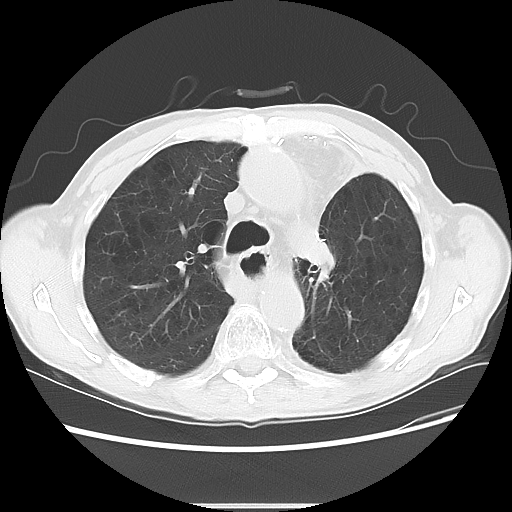
[im 59/71  lung]
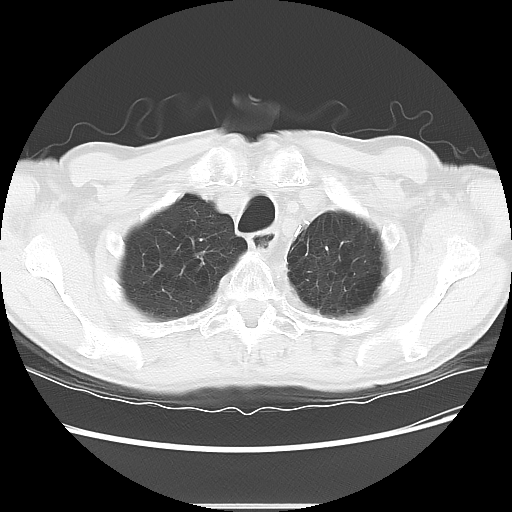

[Series 602: sagittal body · sagittal · 0.70mm/px · 7 of 145 slices shown]
[im 12/145  mediastinal]
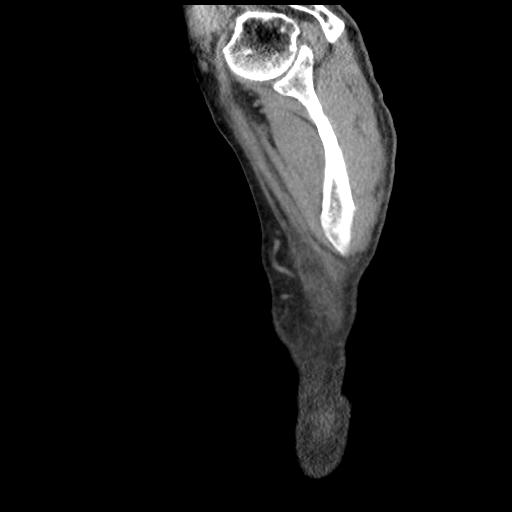
[im 34/145  mediastinal]
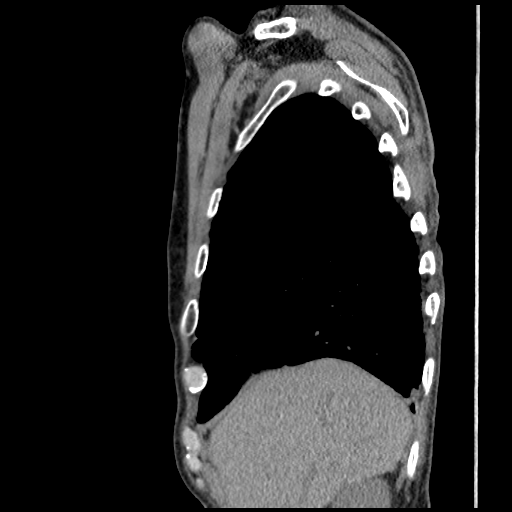
[im 45/145  mediastinal]
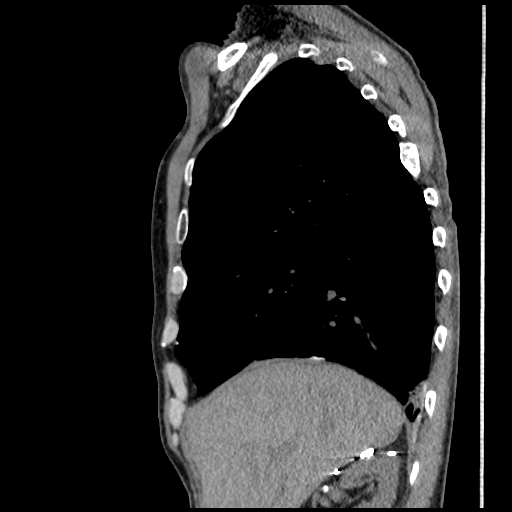
[im 67/145  mediastinal]
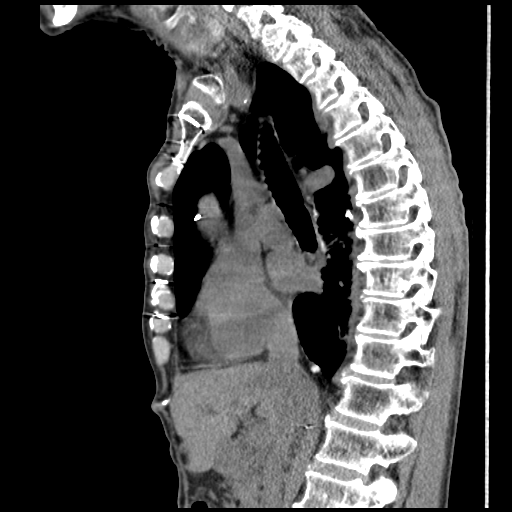
[im 78/145  mediastinal]
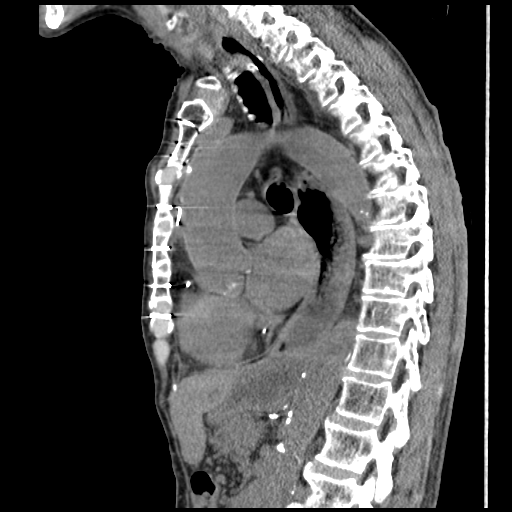
[im 100/145  mediastinal]
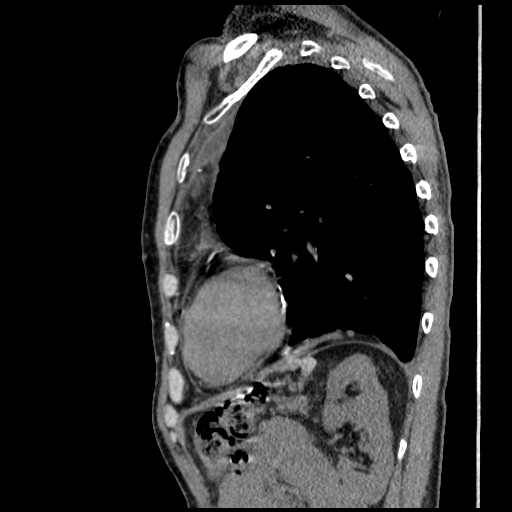
[im 111/145  mediastinal]
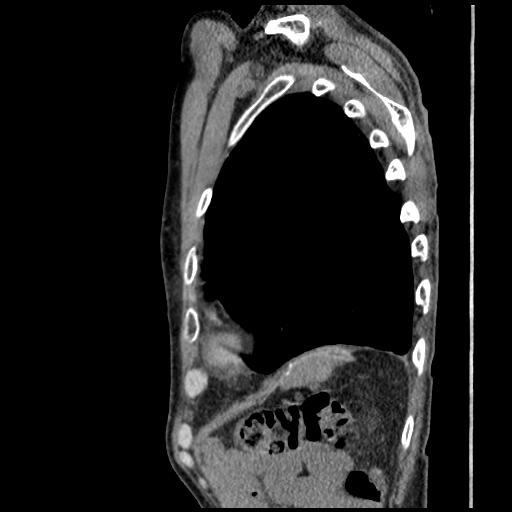

[17 of 30 positions shown; findings below may reference images not displayed]

FINDINGS: Mediastinum/Nodes: No axillary supraclavicular adenopathy. Ascending
aorta dilated to 42 mm compared to 41 mm at same level on comparison
CT of 07/22/2014. No mediastinal lymphadenopathy.

Postop esophagectomy and gastric pull-through anatomy. No
obstructing mass identified.

Lungs/Pleura: Postsurgical change in the LEFT hemi thorax consistent
with LEFT upper lobectomy. No nodularity within the LEFT lung. There
is bilateral centrilobular emphysema.

Upper abdomen: Status post RIGHT adrenalectomy. LEFT adrenal glands
normal. Post cholecystectomy. No acute findings on limited non
contrast exam.

Musculoskeletal: No aggressive osseous lesion. Midline sternotomy
noted.
IMPRESSION: 1. Stable postsurgical change consistent with LEFT upper lobectomy.
No evidence of local lung cancer recurrence.
2. Patient status post esophagectomy and gastric pull-through. No
complicating features.
3. Extensive centrilobular emphysema.
4. Stable ascending aorta aneurysm.

## 2015-10-24 ENCOUNTER — Encounter: Payer: Self-pay | Admitting: Cardiology

## 2015-11-15 ENCOUNTER — Other Ambulatory Visit: Payer: Self-pay | Admitting: Cardiology

## 2015-11-16 ENCOUNTER — Ambulatory Visit: Payer: Medicare Other | Admitting: Cardiology

## 2015-11-23 ENCOUNTER — Ambulatory Visit (INDEPENDENT_AMBULATORY_CARE_PROVIDER_SITE_OTHER): Payer: Medicare Other | Admitting: Cardiology

## 2015-11-23 ENCOUNTER — Encounter: Payer: Self-pay | Admitting: Cardiology

## 2015-11-23 VITALS — BP 90/56 | HR 67 | Ht 70.0 in | Wt 137.8 lb

## 2015-11-23 DIAGNOSIS — E785 Hyperlipidemia, unspecified: Secondary | ICD-10-CM | POA: Diagnosis not present

## 2015-11-23 DIAGNOSIS — I9789 Other postprocedural complications and disorders of the circulatory system, not elsewhere classified: Secondary | ICD-10-CM | POA: Diagnosis not present

## 2015-11-23 DIAGNOSIS — E46 Unspecified protein-calorie malnutrition: Secondary | ICD-10-CM

## 2015-11-23 DIAGNOSIS — I2510 Atherosclerotic heart disease of native coronary artery without angina pectoris: Secondary | ICD-10-CM | POA: Diagnosis not present

## 2015-11-23 DIAGNOSIS — I714 Abdominal aortic aneurysm, without rupture, unspecified: Secondary | ICD-10-CM

## 2015-11-23 DIAGNOSIS — I10 Essential (primary) hypertension: Secondary | ICD-10-CM

## 2015-11-23 DIAGNOSIS — I255 Ischemic cardiomyopathy: Secondary | ICD-10-CM

## 2015-11-23 DIAGNOSIS — I5022 Chronic systolic (congestive) heart failure: Secondary | ICD-10-CM

## 2015-11-23 DIAGNOSIS — I4891 Unspecified atrial fibrillation: Secondary | ICD-10-CM

## 2015-11-23 NOTE — Progress Notes (Signed)
Cardiology Office Note   Date:  11/23/2015   ID:  Glen Green, DOB Aug 04, 1941, MRN [id number]  PCP:  Donnie Coffin, MD  Cardiologist:   Candee Furbish, MD   Chief Complaint  Patient presents with  . Follow-up  . Coronary Artery Disease  . Cardiomyopathy      History of Present Illness: Glen Green is a 74 y.o. male here with coronary artery disease status post bypass surgery, ischemic cardiomyopathy EF 30-35% here for followup. He had emergency CABG x2 on 07/31/13 secondary to severe multivessel coronary artery disease with occluded LAD, occluded circumflex and severely decreased left ventricular dysfunction with acute myocardial infarction and cardiogenic shock.  Prolonged recovery. Postop atrial fibrillation was noted then he converted to sinus rhythm on amiodarone (no longer taking).  His ejection fraction was 25% currently 30-35%.   Cancer 2004 perspective, chemotherapy, right and left Port-A-Cath placement infection. Lung carcinoma approximately 2 years ago, Dr. Servando Snare. I reviewed note from Dr. Servando Snare on 07/28/15 where he recounts his history of adenocarcinoma of esophagus resected, metastatic right adrenal and left upper lobectomy for squamous cell carcinoma. He is having follow-up CT scans.  4 cm abdominal aortic aneurysm, Dr. Trula Slade.   Consultation with Dr. Caryl Comes, EP-starting heart failure medications. Tolerating carvedilol low-dose well as well as lisinopril. No ICD  Enjoying life, playing golf. Feeling well.  Cancer free.  His wife asked about nose, bluish pigmentation. We discussed that amiodarone can cause this however he has not been on this drug for quite some time. It would be unlikely. He likely has a photosensitivity however and I encouraged sunscreen.  Today, he denies chest pain, dyspnea, syncope. Denies bleeding issues.   Denies dizziness.  Still doing what he wants (ie playing golf).     Past Medical History  Diagnosis Date  . Dyslipidemia   .  History of esophageal cancer     s/p transhiatal esophagogastrectomy  . Hypothyroidism   . Cerebral aneurysm     [place]. repair  1994  . History of lung cancer   . Pneumonia   . Cancer     Esophageal, adrenal gland, skin; lung  . Stroke 1995    denies residual  . 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 SBO (small bowel obstruction) 08/15/2012    History of small bowel obstruction (status post small bowel       resection, lysis of adhesions, incidental appendectomy, and repair       of left diaphragmatic hernia  . AAA (abdominal aortic aneurysm) 08/22/2012    3.9 cm by CT  - June 2013, stable   . Postoperative atrial fibrillation 12/04/2013    Short course of amiodarone, resolved. Postop bypass  . CAD (coronary artery disease)   . Atrial fibrillation     Past Surgical History  Procedure Laterality Date  . Left upper lobectomy with node dissection  02/24/2010    [place]  . Exploratory laparotomy, lysis of adhesions, reduce of incarcerated small bowel and colon from the chest, limited small bowel resection, incidental appendectomy, and then repair of diaphragmatic hernia with alloderm mesh  10/27/2009    [place]  . Left subclavian port- a-cath insertion  03/09/2004    [place]  . Exploratory laparotomy,exploratory thoracotomy for hemorrhage  02/11/2003    [place]  . Transhiatal esophagectomy with cholecystectomy, jejunostomy,  pyloroplasty and removal of right subclavian port-a- cath      [facility]  . Brain surgery  brain aneursyn  . Tonsillectomy    . Cholecystectomy    . Coronary artery bypass graft N/A 07/30/2013    Procedure: CORONARY ARTERY BYPASS GRAFTING (CABG) times two on pump using left internal mammary artery and left greater saphenous vein via endovein harvest.;  Surgeon: Gaye Pollack, MD;  Location: [facility] OR;  Service: Open Heart Surgery;  Laterality: N/A;  . Left heart catheterization with coronary angiogram N/A 07/30/2013    Procedure: LEFT HEART  CATHETERIZATION WITH CORONARY ANGIOGRAM;  Surgeon: Peter M Martinique, MD;  Location: [facility] CATH LAB;  Service: Cardiovascular;  Laterality: N/A;    Current Outpatient Prescriptions  Medication Sig Dispense Refill  . aspirin 81 MG tablet Take 1 tablet (81 mg total) by mouth daily. 30 tablet 0  . atorvastatin (LIPITOR) 40 MG tablet TAKE 1 TABLET BY MOUTH EVERY DAY AT 6PM 90 tablet 3  . carvedilol (COREG) 12.5 MG tablet TAKE 1 TABLET (12.5 MG TOTAL) BY MOUTH 2 (TWO) TIMES DAILY. 90 tablet 0  . FLUZONE HIGH-DOSE 0.5 ML SUSY Inject as directed once. VACCINE    . guaiFENesin (MUCINEX) 600 MG 12 hr tablet Take 1,200 mg by mouth 2 (two) times daily as needed for congestion.    Marland Kitchen lisinopril (PRINIVIL,ZESTRIL) 5 MG tablet Take 1 tablet (5 mg total) by mouth daily. 90 tablet 0  . lisinopril (PRINIVIL,ZESTRIL) 5 MG tablet TAKE 1 TABLET (5 MG TOTAL) BY MOUTH DAILY. 30 tablet 3   No current facility-administered medications for this visit.    Allergies:   Review of patient's allergies indicates no known allergies.   Social History:  The patient  reports that he quit smoking about 12 years ago. His smoking use included Cigarettes. He has never used smokeless tobacco. He reports that he does not drink alcohol or use illicit drugs. He worked at [employer] for 40 years.  Family History:  The patient's family history includes Aneurysm in his maternal grandmother and mother; Diabetes in his sister; Heart disease in his maternal uncle and paternal uncle; Kidney disease in his father.   ROS:   Please see the history of present illness.    ROS All other systems reviewed and are negative.   PHYSICAL EXAM: VS:  BP 90/56 mmHg  Pulse 67  Ht '5\' 10"'$  (1.778 m)  Wt 137 lb 12.8 oz (62.506 kg)  BMI 19.77 kg/m2 , Body mass index is 19.77 kg/(m^2). GEN: Well nourished, well developed, in no acute distress HEENT: normal Neck: no JVD, carotid bruits, or masses Cardiac: RRR; no murmurs, rubs, or gallops,no edema    Respiratory:  clear to auscultation bilaterally, normal work of breathing, no wheezing GI: soft, nontender, nondistended, + BS MS: no deformity or atrophy Skin: warm and dry, no rash Neuro:  Alert and Oriented x 3, Strength and sensation are intact Psych: euthymic mood, full affect   EKG:  EKG is ordered today.  The ekg ordered today demonstrates, 11/23/15-sinus rhythm, first degree AV block 288 ms, nonspecific interventricular conduction delay resembling left bundle branch block, T-wave inversion inferior and laterally. No significant change when compared to prior, personally viewed.   Recent Labs: No results found for requested labs within last 365 days.    Lipid Panel    Component Value Date/Time   CHOL 92 07/31/2013 0500   TRIG 73 07/31/2013 0500   HDL 35* 07/31/2013 0500   CHOLHDL 2.6 07/31/2013 0500   VLDL 15 07/31/2013 0500   LDLCALC 42 07/31/2013 0500      Wt  Readings from Last 3 Encounters:  11/23/15 137 lb 12.8 oz (62.506 kg)  08/29/15 138 lb (62.596 kg)  07/28/15 139 lb (63.05 kg)      Other studies Reviewed: Additional studies/ records that were reviewed today include: as above. Review of the above records demonstrates: as above   ASSESSMENT AND PLAN:  General and with coronary artery disease post bypass emergently 2 with cardiogenic shock postoperatively, brief perioperative atrial fibrillation, prior esophageal carcinoma, prior lobectomy with abdominal aortic aneurysm, current ejection fraction ranging between 25 and 35%.  1. Atherosclerosis of native coronary artery of native heart without angina pectoris  -- Overall doing very well, no complaints, no exertional anginal symptoms, Playing golf. Walking frequently. Post bypass.  2. Postoperative atrial fibrillation (HCC)   - Brief episode, no further episodes of A. fib   3. Dyslipidemia   - Continue with atorvastatin. Last LDL 71. ALT normal.   4. Malnutrition ([place])   - Continue to encourage protein  intake. He was 168 pounds after he left marines.   5. Ischemic cardiomyopathy   - EF ranging between 25 and 35%. He is remarkably stable without any significant symptoms, NYHA class 1-2. Blood pressure is rarely low today in clinic however I will not make any adjustments since he is asymptomatic. Continue with beta blocker and ACE inhibitor. He does have a first-degree AV block, watch out for this.   6. Abdominal aortic aneurysm (AAA) without rupture (HCC)   - Dr. Trula Slade monitoring.   7. Essential hypertension, benign   - Blood pressure low today. See above   8. Chronic systolic heart failure (HCC)   - On both beta blocker and ACE inhibitor. No changes today.      Medication Adjustments/Labs and Tests Ordered: Current medicines are reviewed at length with the patient today.  Concerns regarding medicines are outlined above.  Labs/Tests ordered today are listed below. Patient Instructions  Medication Instructions:  No changes today.  Labwork: None today.  Testing/Procedures: None   Follow-Up: Dr. Candee Furbish in 1 year.   Any Other Special Instructions Will Be Listed Below (If Applicable).       Bobby Rumpf, MD  11/23/2015 1:38 PM    [facility] [address], [place], [place]  [address] Phone: [phone]; Fax: [phone]

## 2015-11-23 NOTE — Patient Instructions (Signed)
Medication Instructions:  No changes today.  Labwork: None today.  Testing/Procedures: None   Follow-Up: Dr. Candee Furbish in 1 year.   Any Other Special Instructions Will Be Listed Below (If Applicable).

## 2015-12-28 ENCOUNTER — Other Ambulatory Visit: Payer: Self-pay | Admitting: Cardiology

## 2016-01-02 ENCOUNTER — Other Ambulatory Visit: Payer: Self-pay | Admitting: Dermatology

## 2016-01-08 ENCOUNTER — Other Ambulatory Visit: Payer: Self-pay | Admitting: Cardiology

## 2016-01-23 ENCOUNTER — Other Ambulatory Visit: Payer: Self-pay | Admitting: Cardiology

## 2016-06-18 ENCOUNTER — Other Ambulatory Visit: Payer: Self-pay | Admitting: Cardiology

## 2016-06-21 ENCOUNTER — Other Ambulatory Visit: Payer: Self-pay | Admitting: *Deleted

## 2016-06-21 DIAGNOSIS — C3490 Malignant neoplasm of unspecified part of unspecified bronchus or lung: Secondary | ICD-10-CM

## 2016-07-02 ENCOUNTER — Other Ambulatory Visit: Payer: Self-pay | Admitting: *Deleted

## 2016-07-02 NOTE — Telephone Encounter (Signed)
carvedilol (COREG) 12.5 MG tablet  Medication  Date: 01/23/2016 Department: [address] Office Ordering/Authorizing: Jerline Pain, MD  Order Providers   Prescribing Provider Encounter Provider  Jerline Pain, MD Jerline Pain, MD  Medication Detail    Disp Refills Start End   carvedilol (COREG) 12.5 MG tablet 90 tablet 2 01/23/2016    Sig: TAKE 1 TABLET (12.5 MG TOTAL) BY MOUTH 2 (TWO) TIMES DAILY.   E-Prescribing Status: Receipt confirmed by pharmacy (01/23/2016 4:41 PM EST)   Pharmacy   [id number]- [place], [place]

## 2016-07-03 ENCOUNTER — Other Ambulatory Visit: Payer: Self-pay | Admitting: Cardiology

## 2016-07-03 ENCOUNTER — Other Ambulatory Visit: Payer: Self-pay

## 2016-07-03 MED ORDER — CARVEDILOL 12.5 MG PO TABS: ORAL_TABLET | ORAL | 1 refills | Status: DC

## 2016-07-03 NOTE — Telephone Encounter (Signed)
carvedilol (COREG) 12.5 MG tablet  Medication  Date: 01/23/2016 Department: [address] Office Ordering/Authorizing: Jerline Pain, MD  Order Providers   Prescribing Provider Encounter Provider  Jerline Pain, MD Jerline Pain, MD  Medication Detail    Disp Refills Start End   carvedilol (COREG) 12.5 MG tablet 90 tablet 2 01/23/2016    Sig: TAKE 1 TABLET (12.5 MG TOTAL) BY MOUTH 2 (TWO) TIMES DAILY.   E-Prescribing Status: Receipt confirmed by pharmacy (01/23/2016 4:41 PM EST)   Pharmacy   [id number]- [place], [place]

## 2016-07-25 ENCOUNTER — Other Ambulatory Visit: Payer: Self-pay | Admitting: *Deleted

## 2016-07-25 MED ORDER — LISINOPRIL 5 MG PO TABS: ORAL_TABLET | ORAL | 0 refills | Status: DC

## 2016-08-09 ENCOUNTER — Encounter: Payer: Self-pay | Admitting: Cardiothoracic Surgery

## 2016-08-09 ENCOUNTER — Ambulatory Visit
Admission: RE | Admit: 2016-08-09 | Discharge: 2016-08-09 | Disposition: A | Discharge status: Home / Self Care | Payer: Medicare Other | Source: Ambulatory Visit | Attending: Cardiothoracic Surgery | Admitting: Cardiothoracic Surgery

## 2016-08-09 ENCOUNTER — Ambulatory Visit (INDEPENDENT_AMBULATORY_CARE_PROVIDER_SITE_OTHER): Payer: Medicare Other | Admitting: Cardiothoracic Surgery

## 2016-08-09 VITALS — BP 110/74 | HR 88 | Resp 18 | Ht 70.0 in | Wt 137.0 lb

## 2016-08-09 DIAGNOSIS — Z902 Acquired absence of lung [part of]: Secondary | ICD-10-CM

## 2016-08-09 DIAGNOSIS — C3490 Malignant neoplasm of unspecified part of unspecified bronchus or lung: Secondary | ICD-10-CM

## 2016-08-09 DIAGNOSIS — I712 Thoracic aortic aneurysm, without rupture, unspecified: Secondary | ICD-10-CM

## 2016-08-09 DIAGNOSIS — Z8501 Personal history of malignant neoplasm of esophagus: Secondary | ICD-10-CM

## 2016-08-09 NOTE — Progress Notes (Signed)
[name]       [place] [address]             [phone]                    [place] [facility] Medical Record #[id number] Date of Birth: [birth year]  Referring: Myriam Jacobson, MD Primary Care: Myriam Jacobson, MD  Chief Complaint:    Chief Complaint  Patient presents with  . Thoracic Aortic Aneurysm    1 year f/u with Chest CT    History of Present Illness:    Patient is a 75 year old male who returns today with a followup CT scan ordered last year by Dr. Arlyce Dice. He has a long history of multiple different cancers and surgical resection. 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 The patient later underwent removal of the right adrenal gland for metastatic disease. 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   the patient presented with acute myocardial infarction while playing golf and underwent coronary artery bypass grafting by Dr. Cyndia Bent an emergency basis. He is tolerated this well and has returned to near normal activities.  The patient returns today with a followup CT scan to followup on his previously resected left upper lobe 4.5 cm squamous cell carcinoma  Current Activity/ Functional Status:  Patient is independent with mobility/ambulation, transfers, ADL's, IADL's.  Zubrod Score: At the time of surgery this patient's most appropriate activity status/level should be described as: '[x]'$  Normal activity, no symptoms '[]'$  Symptoms, fully ambulatory '[]'$  Symptoms, in bed less than or equal to 50% of the time '[]'$  Symptoms, in bed greater than 50% of the time but less than 100% '[]'$  Bedridden '[]'$  Moribund   Past Medical History:  Diagnosis Date  . AAA (abdominal aortic aneurysm) ([place]) 08/22/2012   3.9 cm by CT  - June 2013, stable   . 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 Atrial fibrillation ([place])   . CAD (coronary artery disease)   . Cancer ([place])    Esophageal, adrenal gland, skin; lung  . Cerebral aneurysm    [place]. repair  1994  . Dyslipidemia   . History of esophageal cancer    s/p transhiatal esophagogastrectomy  . History of lung cancer   . Hypothyroidism   . Pneumonia   . Postoperative atrial fibrillation ([place]) 12/04/2013   Short course of amiodarone, resolved. Postop bypass  . SBO (small bowel obstruction) ([place]) 08/15/2012   History of small bowel obstruction (status post small bowel       resection, lysis of adhesions, incidental appendectomy, and repair       of left diaphragmatic hernia  . Stroke [facility]) 1995   denies residual    Past Surgical History:  Procedure Laterality Date  . BRAIN SURGERY     brain aneursyn  . CHOLECYSTECTOMY    . CORONARY ARTERY BYPASS GRAFT N/A 07/30/2013   Procedure: CORONARY ARTERY BYPASS GRAFTING (CABG) times two on pump using left internal mammary artery and left greater saphenous vein via endovein harvest.;  Surgeon: Gaye Pollack, MD;  Location: [facility] OR;  Service: Open Heart Surgery;  Laterality: N/A;  . Exploratory laparotomy, lysis of adhesions, reduce of incarcerated small bowel and colon from the  chest, limited small bowel resection, incidental appendectomy, and then repair of diaphragmatic hernia with AlloDerm mesh  10/27/2009   [place]  . Exploratory laparotomy,exploratory thoracotomy for hemorrhage  02/11/2003   [place]  . LEFT HEART CATHETERIZATION WITH CORONARY ANGIOGRAM N/A 07/30/2013   Procedure: LEFT HEART CATHETERIZATION WITH CORONARY ANGIOGRAM;  Surgeon: Peter M Martinique, MD;  Location: [facility] CATH LAB;  Service: Cardiovascular;  Laterality: N/A;  . Left subclavian Port- A-Cath insertion  03/09/2004   [place] Done  . left upper lobectomy with node dissection  02/24/2010   [place]  . TONSILLECTOMY    . Transhiatal  esophagectomy with cholecystectomy, jejunostomy,  pyloroplasty and removal of right subclavian Port-A- Cath     [place]    Family History  Problem Relation Age of Onset  . Aneurysm Mother   . Kidney disease Father   . Heart disease Maternal Uncle   . Heart disease Paternal Uncle   . Aneurysm Maternal Grandmother   . Diabetes Sister     History   Social History  . Marital Status: Married    Spouse Name: N/A    Number of Children: N/A  . Years of Education: N/A   Occupational History  . retired    Social History Main Topics  . Smoking status: Former Smoker    Types: Cigarettes    Quit date: 12/10/2002  . Smokeless tobacco: Never Used  . Alcohol Use: No  . Drug Use: No  . Sexually Active: Not Currently   .   History  Smoking Status  . Former Smoker  . Types: Cigarettes  . Quit date: 12/10/2002  Smokeless Tobacco  . Never Used    History  Alcohol Use No     No Known Allergies  Current Outpatient Prescriptions  Medication Sig Dispense Refill  . aspirin 81 MG tablet Take 1 tablet (81 mg total) by mouth daily. 30 tablet 0  . atorvastatin (LIPITOR) 40 MG tablet TAKE 1 TABLET BY MOUTH EVERY DAY AT 6PM 90 tablet 2  . carvedilol (COREG) 12.5 MG tablet TAKE 1 TABLET (12.5 MG TOTAL) BY MOUTH 2 (TWO) TIMES DAILY. 180 tablet 1  . guaiFENesin (MUCINEX) 600 MG 12 hr tablet Take 1,200 mg by mouth 2 (two) times daily as needed for congestion.    Marland Kitchen lisinopril (PRINIVIL,ZESTRIL) 5 MG tablet TAKE 1 TABLET (5 MG TOTAL) BY MOUTH DAILY. 90 tablet 0   No current facility-administered medications for this visit.        Review of Systems:     Cardiac Review of Systems: Y or N  Chest Pain [  n  ]  Resting SOB [ n  ] Exertional SOB  [n  ]  Orthopnea [n ]   Pedal Edema [ n  ]    Palpitations [ n ] Syncope  [n ]   Presyncope [n  ]  General Review of Systems: [Y] = yes [  ]=no Constitional: recent weight change [n  ]; anorexia [  ]; fatigue [n  ]; nausea [  ]; night sweats [  ];  fever [  ]; or chills [  ];  Dental: poor dentition[  ]; Last Dentist visit:   Eye : blurred vision [  ]; diplopia [   ]; vision changes [  ];  Amaurosis fugax[  ]; Resp: cough [  ];  wheezing[  ];  hemoptysis[  ]; shortness of breath[  ]; paroxysmal nocturnal dyspnea[  ]; dyspnea on exertion[  ]; or orthopnea[  ];  GI:  gallstones[  ], vomiting[  ];  dysphagia[  ]; melena[  ];  hematochezia [  ]; heartburn[  ];   Hx of  Colonoscopy[  ]; GU: kidney stones [  ]; hematuria[  ];   dysuria [  ];  nocturia[  ];  history of     obstruction [  ]; urinary frequency [  ]             Skin: rash, swelling[  ];, hair loss[  ];  peripheral edema[n  ];  or itching[  ]; Musculosketetal: myalgias[  ];  joint swelling[  ];  joint erythema[  ];  joint pain[  ];  back pain[  ];  Heme/Lymph: bruising[  ];  bleeding[  ];  anemia[  ];  Neuro: TIA[  ];  headaches[  ];  stroke[  ];  vertigo[  ];  seizures[  ];   paresthesias[  ];  difficulty walking[  ];  Psych:depression[  ]; anxiety[  ];  Endocrine: diabetes[  ];  thyroid dysfunction[  ];  Immunizations: Flu [  ]; Pneumococcal[  ];  Other:  Physical Exam: BP 110/74 (BP Location: Left Arm, Patient Position: Sitting, Cuff Size: Small)   Pulse 88   Resp 18   Ht '5\' 10"'$  (1.778 m)   Wt 137 lb (62.1 kg)   BMI 19.66 kg/m   General appearance: alert and cooperative Neurologic: intact Heart: regular rate and rhythm, S1, S2 normal, no murmur, click, rub or gallop Lungs: clear to auscultation bilaterally Abdomen: soft, non-tender; bowel sounds normal; no masses,  no organomegaly, easily palpable  by palpation remains approximately 4 cmAAA non tender Extremities: extremities normal, atraumatic, no cyanosis or edema Wound: I do not appreciate any cervical or supraclavicular adenopathy the patient's cervical neck incision is well-healed  his abdominal and right thoracotomy incisions are also well healed. The patient's sternotomy incision is well-healed  Diagnostic Studies & Laboratory data:     Recent Radiology Findings: Ct Chest Wo Contrast  Result Date: 08/09/2016 CLINICAL DATA:  Followup lung cancer EXAM: CT CHEST WITHOUT CONTRAST TECHNIQUE: Multidetector CT imaging of the chest was performed following the standard protocol without IV contrast. COMPARISON:  07/28/2015 FINDINGS: Cardiovascular: The heart size appears normal. Aortic atherosclerosis noted. Previous median sternotomy and CABG procedure. Mediastinum/Nodes: Status post esophagectomy with gastric pull through. The trachea appears patent and is midline. At her. No mediastinal or hilar adenopathy. Lungs/Pleura: There is no pleural effusion identified. Moderate to advanced changes of centrilobular emphysema identified. Nodular pleural calcifications are noted in the right lung base. No suspicious pulmonary nodule or mass identified. Upper Abdomen: There is no suspicious liver abnormality identified. The left adrenal gland appears normal. Bilateral nephrolithiasis noted. The visualized portions of the spleen and pancreas are unremarkable. Musculoskeletal: No aggressive lytic or sclerotic bone lesions identified. IMPRESSION: 1. Stable postsurgical changes involving the chest status post esophagectomy and gastric pull-through and left upper lobectomy. 2. No specific findings identified to suggest residual or recurrence of tumor. 3. Emphysema 4. Aortic atherosclerosis. Electronically Signed   By: Kerby Moors M.D.   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 Status post resection. Additional history of esophageal cancer. Subsequent treatment strategy.  EXAM: CT CHEST WITHOUT CONTRAST  TECHNIQUE: Multidetector CT imaging of the chest was performed following the  standard protocol without IV contrast.  COMPARISON:  CT 07/22/2014  FINDINGS: Mediastinum/Nodes: No axillary supraclavicular adenopathy. Ascending aorta dilated to 42 mm compared to 41 mm at same level on comparison CT of 07/22/2014. No mediastinal lymphadenopathy.  Postop esophagectomy and gastric pull-through anatomy. No obstructing mass identified.  Lungs/Pleura: Postsurgical change in the LEFT hemi thorax consistent with LEFT upper lobectomy. No nodularity within the LEFT lung. There is bilateral centrilobular emphysema.  Upper abdomen: Status post RIGHT adrenalectomy. LEFT adrenal glands normal. Post cholecystectomy. No acute findings on limited non contrast exam.  Musculoskeletal: No aggressive osseous lesion. Midline sternotomy noted.  IMPRESSION: 1. Stable postsurgical change consistent with LEFT upper lobectomy. No evidence of local lung cancer recurrence. 2. Patient status post esophagectomy and gastric pull-through. No complicating features. 3. Extensive centrilobular emphysema. 4. Stable ascending aorta aneurysm.   Electronically Signed   By: Suzy Bouchard M.D.   On: 07/28/2015 11:32   Dg Chest 1 View  07/13/2013   *RADIOLOGY REPORT*  Clinical Data: Post right thoracentesis  CHEST - 1 VIEW  Comparison: 12/05/2011 chest radiograph, PET CT 07/07/2013  Findings: A bulla at the right base is incidentally re-identified. Mild fluid tracking along the right minor fissure is noted.  Small left effusion is present.  No pneumothorax.  Left apical clips are present.  Heart size is normal.  Right upper quadrant clips. Emphysematous changes and patchy ill-defined predominately right lower lobe airspace opacity is identified.  IMPRESSION: No pneumothorax after thoracentesis.   Original Report Authenticated By: Conchita Paris, M.D.   Ct Chest Wo Contrast  07/13/2013   *RADIOLOGY REPORT*  Clinical Data: History of lung carcinoma with right pleural effusion and abnormal pleural enhancement by PET scan.  Evaluation  is performed for possible pleural biopsy.  CT CHEST WITHOUT CONTRAST  Technique:  Multidetector CT imaging of the chest was performed following the standard protocol without IV contrast.  Comparison: PET scan on 07/07/2013  Findings: Imaging was performed in a prone position. There is no evidence of right-sided pleural mass.  A small pleural effusion is present which layers dependently in the anterior hemithorax when the patient is in a prone position.  Significant COPD present.  No pulmonary nodules are identified.  IMPRESSION: No evidence of pleural mass.  A small layering right-sided pleural effusion is present. An ultrasound-guided right-sided thoracentesis was subsequently performed.   Original Report Authenticated By: Aletta Edouard, M.D.   Nm Pet Image Restag (ps) Skull Base To Thigh  07/07/2013   *RADIOLOGY REPORT*  Clinical Data: Subsequent treatment strategy for esophageal cancer and lung cancer.  NUCLEAR MEDICINE PET SKULL BASE TO THIGH  Fasting Blood Glucose:  89  Technique:  15.1 mCi F-18 FDG was injected intravenously. CT data was obtained and used for attenuation correction and anatomic localization only.  (This was not acquired as a diagnostic CT examination.) Additional exam technical data entered on technologist worksheet.  Comparison:  07/02/2013  Findings:  Neck: No hypermetabolic lymph nodes in the neck.  Chest:  Postoperative change from esophagectomy and gastric pull- through identified.  A left upper lobectomy has also been performed.  There has been interval decrease in volume of bilateral pleural effusions.  There is a small focus of mild increased FDG uptake localizing to the previously described loculated right pleural effusion.  The SUV max within this area is equal to 3.0, image 134.  No hypermetabolic mediastinal or hilar nodes.  No suspicious pulmonary nodules on the CT scan.  Abdomen/Pelvis:  No abnormal hypermetabolic activity within the liver, pancreas, adrenal glands, or  spleen.  No hypermetabolic lymph nodes in the abdomen or pelvis. Again noted is an abdominal aortic aneurysm.  The maximum AP dimension is equal to 3.6, image 172/series 2.  Skeleton:  No focal hypermetabolic activity to suggest skeletal metastasis.  IMPRESSION:  1.  There is a small focus of mild increased uptake localizing to the partially loculated right pleural effusion.  This is nonspecific and may reflect inflammation or infection.  Malignant pleural effusion cannot be excluded.  Consider further evaluation with diagnostic thoracentesis. 2.  Infrarenal abdominal aortic aneurysm.   Original Report Authenticated By: Kerby Moors, M.D.     Ct Chest W Contrast  07/02/2013   *RADIOLOGY REPORT*  Clinical Data: History of esophageal cancer with esophagectomy and gastric pull-through.  CT CHEST WITH CONTRAST  Technique:  Multidetector CT imaging of the chest was performed following the standard protocol during bolus administration of intravenous contrast.  Contrast: 128m OMNIPAQUE IOHEXOL 300 MG/ML  SOLN  Comparison: 05/26/2012  Findings: The lungs are well-aerated bilaterally with emphysematous changes noted. A small right-sided pleural effusion is identified with some associated right lower lobe atelectasis.  This is new from prior exam.  No new focal infiltrate or sizable parenchymal nodule is seen.  There are changes consistent with the patient's given clinical history of esophagectomy and gastric pull-through.  An air and fluid is noted within the stomach.  No obstructive changes are seen.  The thoracic aorta and pulmonary artery are less than stable.  Mild dilatation of the proximal ascending aorta is again seen and stable.  Heavy coronary calcifications are again seen. No hilar or mediastinal adenopathy is identified.  Scanning into the upper abdomen reveals postsurgical changes.  IMPRESSION: Changes consistent with the known history of esophagectomy and gastric pull-through.  The overall appearance is  stable.  No recurrent disease is seen.  Stable dilatation of the ascending aorta.  Stable emphysematous changes.   Original Report Authenticated By: MInez Catalina M.D.   Ct Abdomen W Contrast  07/02/2013   *RADIOLOGY REPORT*  Clinical Data: Follow-up for the soft medial and lung cancer.  CT ABDOMEN WITH CONTRAST  Technique:  Multidetector CT imaging of the abdomen was performed following the standard protocol during bolus administration of intravenous contrast.  Contrast:  100 ml of Omnipaque-300  Comparison: May 26, 2012  CT of the chest:  Findings: There are extensive emphysematous changes of bilateral lungs.  Stable postsurgical changes are identified involving the left hemithorax.  There are no pulmonary nodules or mass.  There is interval developed minimal left pleural effusion and moderate right pleural effusion.  Along the medial aspect of the right pleural effusion, there is suggestion of pleural based 1 cm nodular enhancement best seen on series 3 image 46.  There is no mediastinal or hilar lymphadenopathy.  The patient is status post prior gastric pull-through without interval change.  The heart size is normal.  There is no pericardial effusion.  Images of the bones demonstrate no focal discrete lytic or blastic lesion.  Impression:  Interval developed bilateral pleural effusions, right greater than left.  Along the medial aspect of the right pleural effusion, there  is suggestion of a pleural based 1 cm nodule enhancement new since prior exam.  Metastasis is not excluded. Further evaluation with right thoracentesis with cytology may be helpful if clinically indicated.  CT of the abdomen:  Findings:  Images of the abdomen demonstrate normal liver.  The patient is status post prior cholecystectomy.  There is postsurgical common bile duct dilatation measuring 1.3 cm unchanged compared prior exam.  The spleen, pancreas, left adrenal gland are normal.  There are bilateral kidneys cysts unchanged.  There is  no hydronephrosis bilaterally.  There are stable postsurgical changes in the right retroperitoneum.  There are small stable small lymph nodes in the retroperitoneum periaortic region unchanged.  There is stable infrarenal abdominal aorta measuring 4.1 cm in diameter.  The visualized bowel is normal.  Images of the bones demonstrate no definite focal lytic or blastic lesions.  IMPRESSION: Stable abdomen CT without change compared prior exam.  No CT evidence of abdominal metastatic disease.   Original Report Authenticated By: Abelardo Diesel, M.D.    The patient's CT scans were read simultaneously by 2 different radiologists with conflicting reports in inconsistencies. I've talked to Dr. Ardeen Garland who is reviewed the CT of the chest and abdomen, he will have the reports amended. On my interpretation The patient has a 4 cm abdominal aortic aneurysm mildly dilated ascending aorta 3.6 cm. There is a new right pleural effusion with some nodularity and enhancement suggestive of a malignant effusion.  Recent Lab Findings: Lab Results  Component Value Date   WBC 8.2 08/06/2013   HGB 10.4 (L) 08/06/2013   HCT 30.9 (L) 08/06/2013   PLT 340 08/06/2013   GLUCOSE 101 (H) 10/15/2014   CHOL 92 07/31/2013   TRIG 73 07/31/2013   HDL 35 (L) 07/31/2013   LDLCALC 42 07/31/2013   ALT 46 08/02/2013   AST 55 (H) 08/02/2013   NA 138 10/15/2014   K 4.9 10/15/2014   CL 103 10/15/2014   CREATININE 1.1 10/15/2014   BUN 26 (H) 10/15/2014   CO2 26 10/15/2014   TSH 1.766 07/31/2013   INR 1.52 (H) 07/31/2013   HGBA1C 6.5 (H) 07/30/2013    Diagnosis PLEURAL FLUID, RIGHT NO MALIGNANT CELLS IDENTIFIED. 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  Incidental finding dilatation of the ascending aorta 3.6 and 4 cm abdominal aortic aneurysm. I referred the patient to Dr. Trula Slade last year for evaluation and following of his abdominal aortic aneurysm., But the patient never went he has an appointment for September with abdominal ultrasound.  Patient has a history of cerebral aneurysm repaired 1999, both his mother and grandmother died at an early age from cerebral aneurysms.   Plan see the patient back with followup  in 12 months will decided after seeing patient if repeat ct is indicated, He has no follow up with oncology  Grace Isaac MD      [address] [place] [address] Office [phone]  Beeper [phone]  08/09/2016 3:10 PM

## 2016-08-28 ENCOUNTER — Encounter: Payer: Self-pay | Admitting: Family

## 2016-09-03 ENCOUNTER — Ambulatory Visit (HOSPITAL_COMMUNITY)
Admission: RE | Admit: 2016-09-03 | Discharge: 2016-09-03 | Disposition: A | Discharge status: Home / Self Care | Payer: Medicare Other | Source: Ambulatory Visit | Attending: Family | Admitting: Family

## 2016-09-03 ENCOUNTER — Ambulatory Visit (INDEPENDENT_AMBULATORY_CARE_PROVIDER_SITE_OTHER): Payer: Medicare Other | Admitting: Family

## 2016-09-03 ENCOUNTER — Encounter: Payer: Self-pay | Admitting: Family

## 2016-09-03 VITALS — BP 122/77 | HR 70 | Temp 97.5°F | Resp 18 | Ht 70.0 in | Wt 143.9 lb

## 2016-09-03 DIAGNOSIS — I714 Abdominal aortic aneurysm, without rupture, unspecified: Secondary | ICD-10-CM

## 2016-09-03 DIAGNOSIS — Z87891 Personal history of nicotine dependence: Secondary | ICD-10-CM

## 2016-09-03 NOTE — Progress Notes (Signed)
[facility] [place]   CC: Follow up Abdominal Aortic Aneurysm  History of Present Illness  [place] is a 75 y.o. (1941-07-10) male patient of Dr. Trula Slade who returns for evaluation of his abdominal aortic aneurysm. This was reportedly detected in about 2010. It has remained stable in size measuring approximately 4 cm. He denies any abdominal pain or back pain.  The patient has a history of an ischemic heart disease. He presented in August of 2014 with shock and heart failure and was found to have an occluded LAD and circumflex. He underwent emergent cardiac bypass surgery. 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  He has a history of esophageal cancer, status post distal esophagectomy and 2004, followed by chemotherapy. He is also status post right adrenalectomy and left upper lobectomy in 2011, presumably for metastatic disease.  He suffers from hypercholesterolemia, treated with a statin. He has a history of smoking but quit in 2004. He also has a history of a ruptured cerebral aneurysm.  He had a CTA of his chest in August 2016 as requested by Dr. Servando Snare, results pertaining to AAA are below. The patient denies claudication in legs with walking, denies non healing wounds. The patient denies history of stroke or TIA symptoms.  Pt Diabetic: borderline Pt smoker: former smoker, quit about 2004, states he smoked for about 50 years   Past Medical History:  Diagnosis Date  . AAA (abdominal aortic aneurysm) ([place]) 08/22/2012   3.9 cm by CT  - June 2013, stable   . 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 Atrial fibrillation ([place])   . CAD (coronary artery disease)   . Cancer ([place])    Esophageal, adrenal gland, skin; lung  . Carotid artery occlusion   . Cerebral aneurysm    [place]. repair  1994  . Dyslipidemia   . History of esophageal cancer    s/p transhiatal esophagogastrectomy  . History of lung cancer   . Hypothyroidism   .  Pneumonia   . Postoperative atrial fibrillation ([place]) 12/04/2013   Short course of amiodarone, resolved. Postop bypass  . SBO (small bowel obstruction) ([place]) 08/15/2012   History of small bowel obstruction (status post small bowel       resection, lysis of adhesions, incidental appendectomy, and repair       of left diaphragmatic hernia  . Stroke [facility]) 1995   denies residual   Past Surgical History:  Procedure Laterality Date  . BRAIN SURGERY     brain aneursyn  . CHOLECYSTECTOMY    . CORONARY ARTERY BYPASS GRAFT N/A 07/30/2013   Procedure: CORONARY ARTERY BYPASS GRAFTING (CABG) times two on pump using left internal mammary artery and left greater saphenous vein via endovein harvest.;  Surgeon: Gaye Pollack, MD;  Location: [facility] OR;  Service: Open Heart Surgery;  Laterality: N/A;  . Exploratory laparotomy, lysis of adhesions, reduce of incarcerated small bowel and colon from the chest, limited small bowel resection, incidental appendectomy, and then repair of diaphragmatic hernia with AlloDerm mesh  10/27/2009   [place]  . Exploratory laparotomy,exploratory thoracotomy for hemorrhage  02/11/2003   [place]  . LEFT HEART CATHETERIZATION WITH CORONARY ANGIOGRAM N/A 07/30/2013   Procedure: LEFT HEART CATHETERIZATION WITH CORONARY ANGIOGRAM;  Surgeon: Peter M Martinique, MD;  Location: [facility] CATH LAB;  Service: Cardiovascular;  Laterality: N/A;  . Left subclavian Port- A-Cath insertion  03/09/2004   [place] Done  . left upper lobectomy with node dissection  02/24/2010   [place]  .  TONSILLECTOMY    . Transhiatal esophagectomy with cholecystectomy, jejunostomy,  pyloroplasty and removal of right subclavian Port-A- Cath     [place]   Social History Social History   Social History  . Marital status: Married    Spouse name: N/A  . Number of children: N/A  . Years of education: N/A   Occupational History  . retired    Social History Main Topics  . Smoking status: Former Smoker    Types: Cigarettes    Quit  date: 12/10/2002  . Smokeless tobacco: Never Used  . Alcohol use No  . Drug use: No  . Sexual activity: Not Currently   Other Topics Concern  . Not on file   Social History Narrative   Pt lives with wife.    Family History Family History  Problem Relation Age of Onset  . Aneurysm Mother   . Kidney disease Father   . Heart disease Maternal Uncle   . Heart disease Paternal Uncle   . Aneurysm Maternal Grandmother   . Diabetes Sister     Current Outpatient Prescriptions on File Prior to Visit  Medication Sig Dispense Refill  . aspirin 81 MG tablet Take 1 tablet (81 mg total) by mouth daily. 30 tablet 0  . atorvastatin (LIPITOR) 40 MG tablet TAKE 1 TABLET BY MOUTH EVERY DAY AT 6PM 90 tablet 2  . carvedilol (COREG) 12.5 MG tablet TAKE 1 TABLET (12.5 MG TOTAL) BY MOUTH 2 (TWO) TIMES DAILY. 180 tablet 1  . guaiFENesin (MUCINEX) 600 MG 12 hr tablet Take 1,200 mg by mouth 2 (two) times daily as needed for congestion.    Marland Kitchen lisinopril (PRINIVIL,ZESTRIL) 5 MG tablet TAKE 1 TABLET (5 MG TOTAL) BY MOUTH DAILY. 90 tablet 0   No current facility-administered medications on file prior to visit.    No Known Allergies  ROS: See HPI for pertinent positives and negatives.  Physical Examination  Vitals:   09/03/16 0851 09/03/16 0853  BP: 129/77 122/77  Pulse: 70   Resp: 18   Temp: 97.5 F (36.4 C)   TempSrc: Oral   SpO2: 100%   Weight: 143 lb 14.4 oz (65.3 kg)   Height: '5\' 10"'$  (1.778 m)    Body mass index is 20.65 kg/m.  General: A&O x 3, WD, thin male.  Pulmonary: Sym exp, good air movt, CTAB, no rales, rhonchi, or wheezing.  Cardiac: RRR, Nl S1, S2, no detected murmur.   Carotid Bruits Right Left   Negative Negative   Aorta is palpable Radial pulses are 1+ palpable and =                          VASCULAR EXAM:  LE Pulses Right Left        FEMORAL  1+ palpable  1+palpable       POPLITEAL  not palpable  not palpable       POSTERIOR TIBIAL  faintly palpable faintly palpable       DORSALIS PEDIS      ANTERIOR TIBIAL not palpable not palpable     Gastrointestinal: soft, NTND, -G/R, - HSM, - masses palpated, - CVAT B.  Musculoskeletal: M/S 5/5 throughout, Extremities without ischemic changes.  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   07/28/15 CTA chest requested by Dr. 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 Status post resection. Additional history of esophageal cancer. Subsequent treatment strategy.  EXAM: CT CHEST WITHOUT CONTRAST  TECHNIQUE: Multidetector CT imaging of the chest was performed following the standard protocol without IV contrast.  COMPARISON: CT 07/22/2014  FINDINGS: Mediastinum/Nodes: No axillary supraclavicular adenopathy. Ascending aorta dilated to 42 mm compared to 41 mm at same level on comparison CT of 07/22/2014. No mediastinal lymphadenopathy.   Non-Invasive Vascular Imaging  AAA Duplex (09/03/2016)  Previous size: 4.1 cm (Date: 08/29/2015)   Current size:  4.4 cm (Date: 09/03/16)  Medical Decision Making  The patient is a 75 y.o. male who presents with asymptomatic AAA with 3 mm increase in size in a year, at 4.4 cm today, normal diameters of common iliac arteries.   Based on this patient's exam and diagnostic studies, the patient will follow up in 1 year  with the following studies: AAA duplex.  Consideration for repair of AAA would be made when the size is 5.5 cm, growth > 1 cm/yr, and symptomatic status.  I emphasized the importance of maximal medical management including strict control of blood pressure, blood glucose, and lipid levels, antiplatelet  agents, obtaining regular exercise, and continued  cessation of smoking.   The patient was given information about AAA including signs, symptoms, treatment, and how to minimize the risk of enlargement and rupture of aneurysms.    The patient was advised to call 911 should the patient experience sudden onset abdominal or back pain.   Thank you for allowing [place] to participate in this patient's care.  Clemon Chambers, RN, MSN, FNP-C [facility] [place] Office: [facility] Physician: Trula Slade  09/03/2016, 8:56 AM

## 2016-09-03 NOTE — Patient Instructions (Signed)
Abdominal Aortic Aneurysm An aneurysm is a weakened or damaged part of an artery wall that bulges from the normal force of blood pumping through the body. An abdominal aortic aneurysm is an aneurysm that occurs in the lower part of the aorta, the main artery of the body.  The major concern with an abdominal aortic aneurysm is that it can enlarge and burst (rupture) or blood can flow between the layers of the wall of the aorta through a tear (aorticdissection). Both of these conditions can cause bleeding inside the body and can be life threatening unless diagnosed and treated promptly. CAUSES  The exact cause of an abdominal aortic aneurysm is unknown. Some contributing factors are:   A hardening of the arteries caused by the buildup of fat and other substances in the lining of a blood vessel (arteriosclerosis).  Inflammation of the walls of an artery (arteritis).   Connective tissue diseases, such as Marfan syndrome.   Abdominal trauma.   An infection, such as syphilis or staphylococcus, in the wall of the aorta (infectious aortitis) caused by bacteria. RISK FACTORS  Risk factors that contribute to an abdominal aortic aneurysm may include:  Age older than 60 years.   High blood pressure (hypertension).  Male gender.  Ethnicity (white race).  Obesity.  Family history of aneurysm (first degree relatives only).  Tobacco use. PREVENTION  The following healthy lifestyle habits may help decrease your risk of abdominal aortic aneurysm:  Quitting smoking. Smoking can raise your blood pressure and cause arteriosclerosis.  Limiting or avoiding alcohol.  Keeping your blood pressure, blood sugar level, and cholesterol levels within normal limits.  Decreasing your salt intake. In somepeople, too much salt can raise blood pressure and increase your risk of abdominal aortic aneurysm.  Eating a diet low in saturated fats and cholesterol.  Increasing your fiber intake by including  whole grains, vegetables, and fruits in your diet. Eating these foods may help lower blood pressure.  Maintaining a healthy weight.  Staying physically active and exercising regularly. SYMPTOMS  The symptoms of abdominal aortic aneurysm may vary depending on the size and rate of growth of the aneurysm.Most grow slowly and do not have any symptoms. When symptoms do occur, they may include:  Pain (abdomen, side, lower back, or groin). The pain may vary in intensity. A sudden onset of severe pain may indicate that the aneurysm has ruptured.  Feeling full after eating only small amounts of food.  Nausea or vomiting or both.  Feeling a pulsating lump in the abdomen.  Feeling faint or passing out. DIAGNOSIS  Since most unruptured abdominal aortic aneurysms have no symptoms, they are often discovered during diagnostic exams for other conditions. An aneurysm may be found during the following procedures:  Ultrasonography (A one-time screening for abdominal aortic aneurysm by ultrasonography is also recommended for all men aged 65-75 years who have ever smoked).  X-ray exams.  A computed tomography (CT).  Magnetic resonance imaging (MRI).  Angiography or arteriography. TREATMENT  Treatment of an abdominal aortic aneurysm depends on the size of your aneurysm, your age, and risk factors for rupture. Medication to control blood pressure and pain may be used to manage aneurysms smaller than 6 cm. Regular monitoring for enlargement may be recommended by your caregiver if:  The aneurysm is 3-4 cm in size (an annual ultrasonography may be recommended).  The aneurysm is 4-4.5 cm in size (an ultrasonography every 6 months may be recommended).  The aneurysm is larger than 4.5 cm in   size (your caregiver may ask that you be examined by a vascular surgeon). If your aneurysm is larger than 6 cm, surgical repair may be recommended. There are two main methods for repair of an aneurysm:   Endovascular  repair (a minimally invasive surgery). This is done most often.  Open repair. This method is used if an endovascular repair is not possible.   This information is not intended to replace advice given to you by your health care provider. Make sure you discuss any questions you have with your health care provider.   Document Released: 09/05/2005 Document Revised: 03/23/2013 Document Reviewed: 12/26/2012 Elsevier Interactive Patient Education 2016 [employer]  

## 2016-10-27 IMAGING — CT CT CHEST W/O CM
3 of 4 series · 17 of 30 positions shown, 19 images · non-contrast
Comparison: 07/28/2015

CLINICAL DATA: Followup lung cancer

EXAM:
CT CHEST WITHOUT CONTRAST
TECHNIQUE: Multidetector CT imaging of the chest was performed following the
standard protocol without IV contrast.

[Series 3: chest w/o · axial · non-contrast · 0.65mm/px · z∈[-276,-9]mm · 7 of 143 slices shown]
[im 18/143  lung]
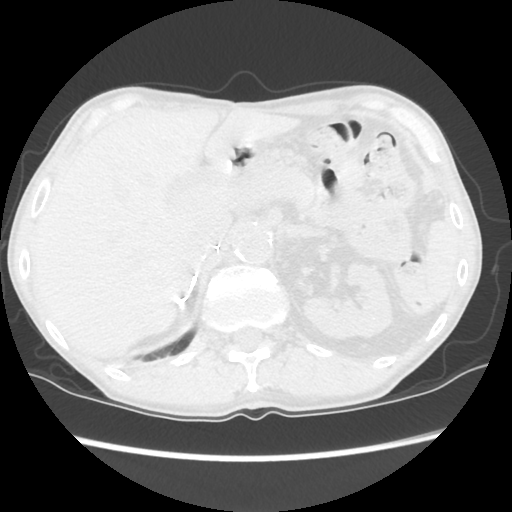
[im 36/143  lung]
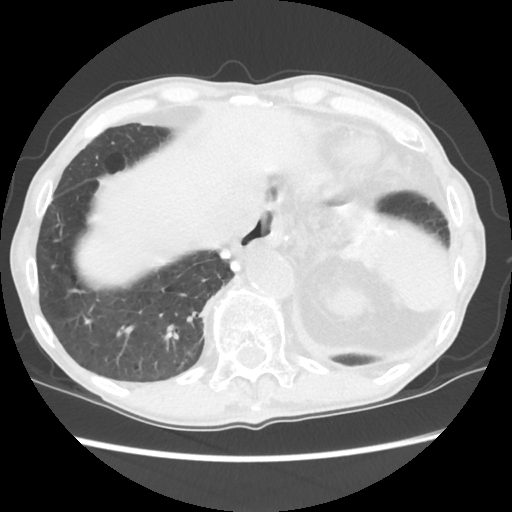
[im 54/143  lung]
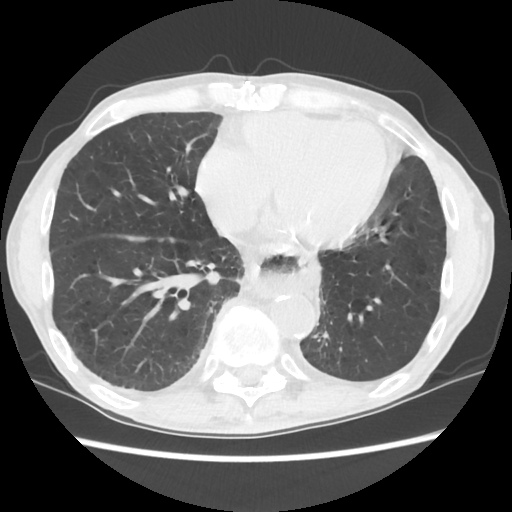
[im 72/143  lung]
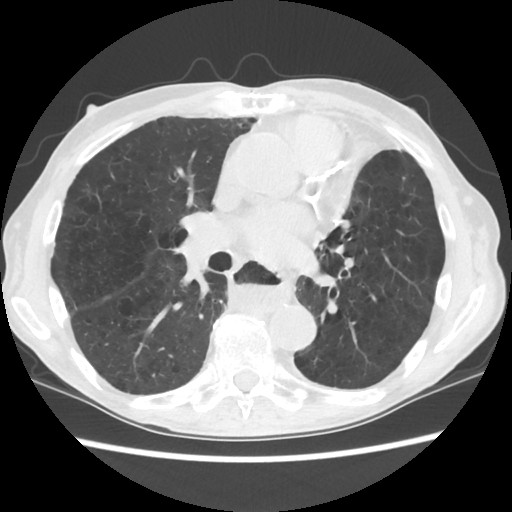
[im 89/143  lung]
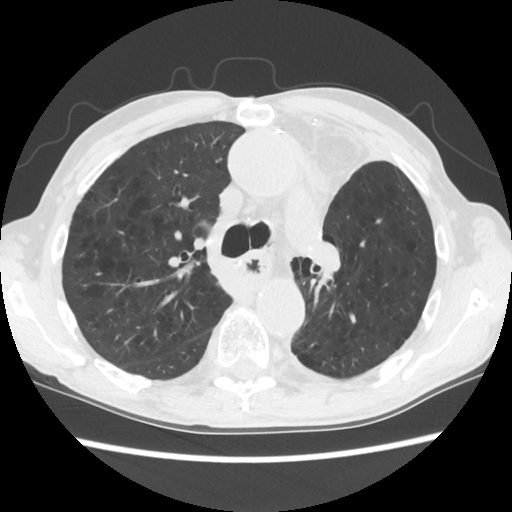
[im 107/143  lung]
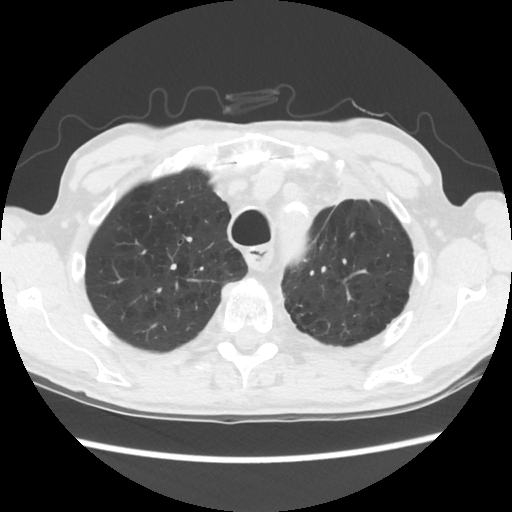
[im 125/143  lung]
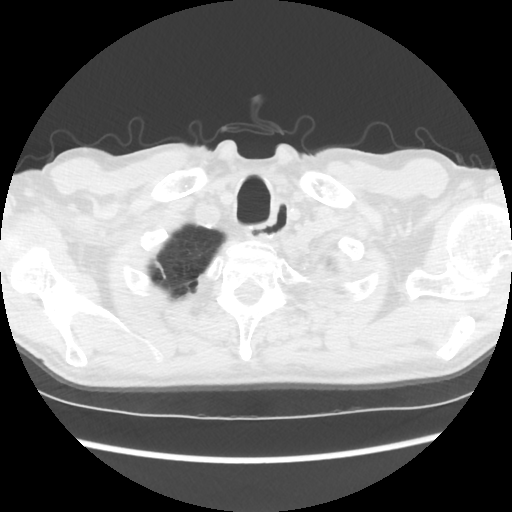

[Series 4: lung windows · axial · 0.65mm/px · z∈[-282,-4]mm · 8 of 143 slices shown, 10 images]
[im 16/143  mediastinal]
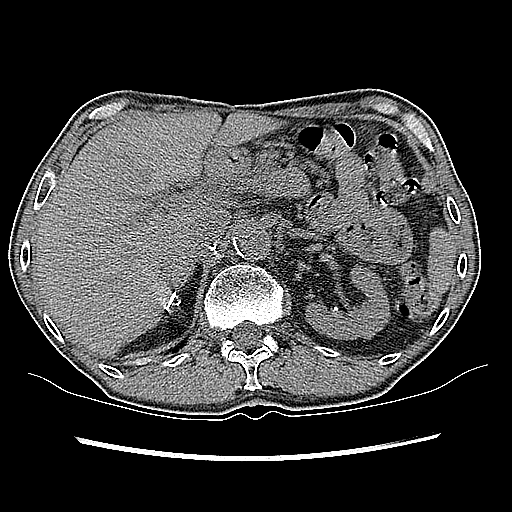
[im 16/143  lung]
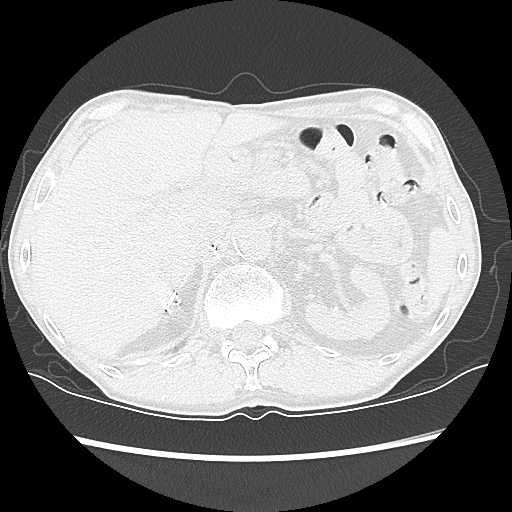
[im 32/143  lung]
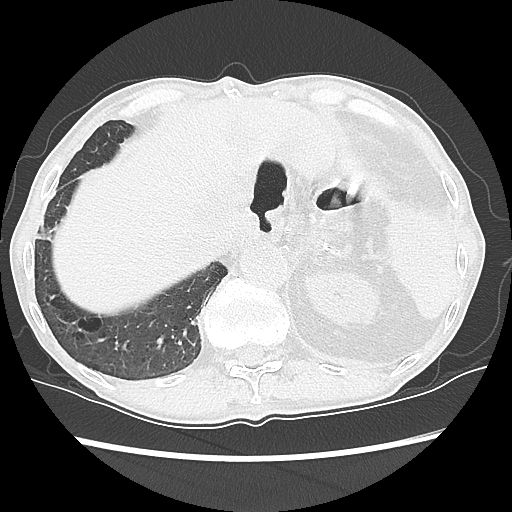
[im 48/143  lung]
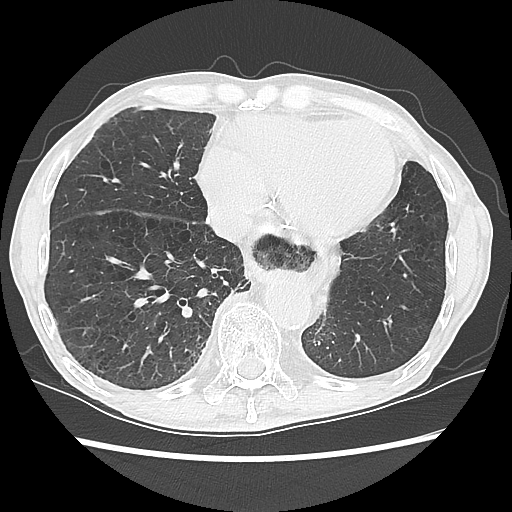
[im 64/143  lung]
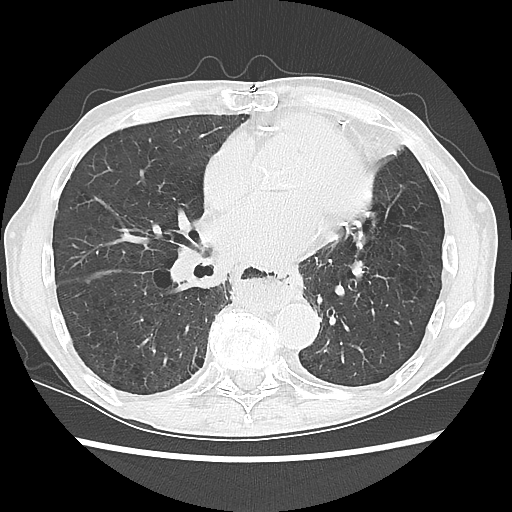
[im 79/143  mediastinal]
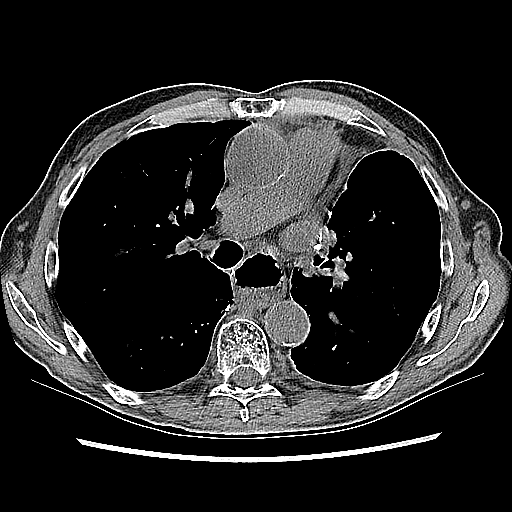
[im 79/143  lung]
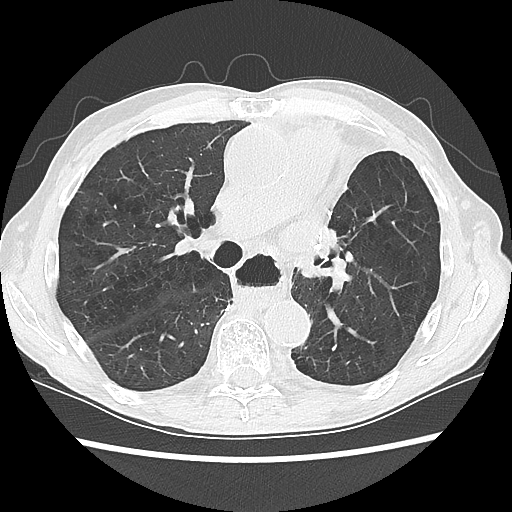
[im 95/143  lung]
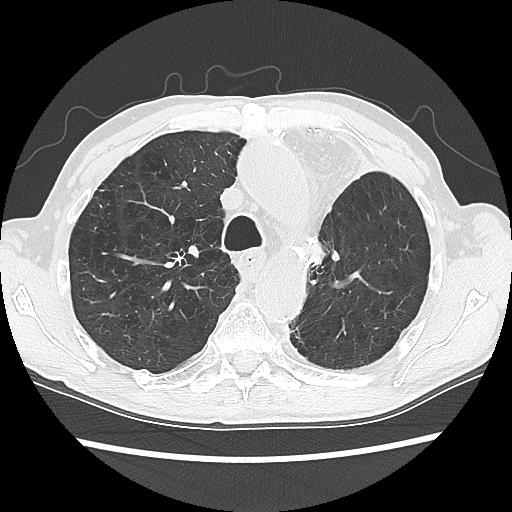
[im 111/143  lung]
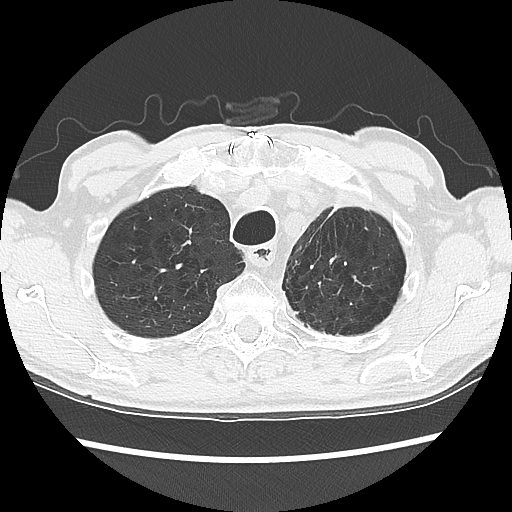
[im 127/143  lung]
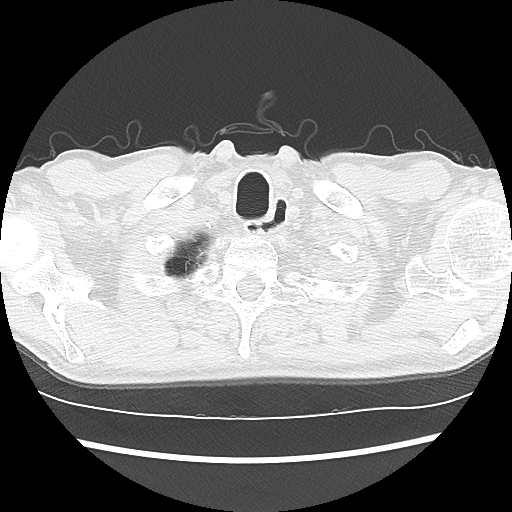

[Series 602: sagittal body · sagittal · 0.70mm/px · 2 of 134 slices shown]
[im 17/134  mediastinal]
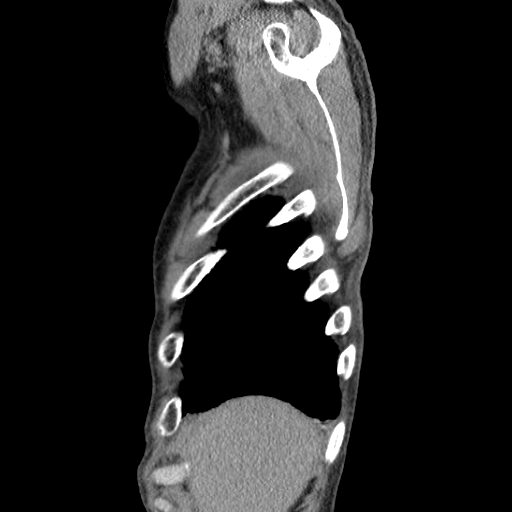
[im 34/134  mediastinal]
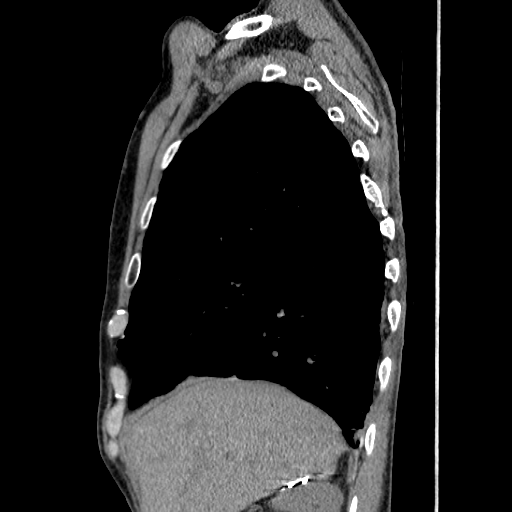

[17 of 30 positions shown; findings below may reference images not displayed]

FINDINGS: Cardiovascular: The heart size appears normal. Aortic
atherosclerosis noted. Previous median sternotomy and CABG
procedure.

Mediastinum/Nodes: Status post esophagectomy with gastric pull
through. The trachea appears patent and is midline. At her. No
mediastinal or hilar adenopathy.

Lungs/Pleura: There is no pleural effusion identified. Moderate to
advanced changes of centrilobular emphysema identified. Nodular
pleural calcifications are noted in the right lung base. No
suspicious pulmonary nodule or mass identified.

Upper Abdomen: There is no suspicious liver abnormality identified.
The left adrenal gland appears normal. Bilateral nephrolithiasis
noted. The visualized portions of the spleen and pancreas are
unremarkable.

Musculoskeletal: No aggressive lytic or sclerotic bone lesions
identified.
IMPRESSION: 1. Stable postsurgical changes involving the chest status post
esophagectomy and gastric pull-through and left upper lobectomy.
2. No specific findings identified to suggest residual or recurrence
of tumor.
3. Emphysema
4. Aortic atherosclerosis.

## 2016-11-06 ENCOUNTER — Encounter: Payer: Self-pay | Admitting: Cardiology

## 2016-11-08 NOTE — Addendum Note (Signed)
Addended by: Lianne Cure A on: 11/08/2016 08:47 AM   Modules accepted: Orders

## 2016-11-13 ENCOUNTER — Ambulatory Visit (INDEPENDENT_AMBULATORY_CARE_PROVIDER_SITE_OTHER): Payer: Medicare Other | Admitting: Cardiology

## 2016-11-13 ENCOUNTER — Encounter: Payer: Self-pay | Admitting: Cardiology

## 2016-11-13 VITALS — BP 118/60 | HR 62 | Ht 70.0 in | Wt 144.6 lb

## 2016-11-13 DIAGNOSIS — I4891 Unspecified atrial fibrillation: Secondary | ICD-10-CM

## 2016-11-13 DIAGNOSIS — I5022 Chronic systolic (congestive) heart failure: Secondary | ICD-10-CM

## 2016-11-13 DIAGNOSIS — I255 Ischemic cardiomyopathy: Secondary | ICD-10-CM

## 2016-11-13 DIAGNOSIS — I714 Abdominal aortic aneurysm, without rupture, unspecified: Secondary | ICD-10-CM

## 2016-11-13 DIAGNOSIS — E7800 Pure hypercholesterolemia, unspecified: Secondary | ICD-10-CM

## 2016-11-13 DIAGNOSIS — I2510 Atherosclerotic heart disease of native coronary artery without angina pectoris: Secondary | ICD-10-CM

## 2016-11-13 DIAGNOSIS — I9789 Other postprocedural complications and disorders of the circulatory system, not elsewhere classified: Secondary | ICD-10-CM

## 2016-11-13 DIAGNOSIS — I110 Hypertensive heart disease with heart failure: Secondary | ICD-10-CM

## 2016-11-13 NOTE — Patient Instructions (Signed)
Medication Instructions:  Your physician recommends that you continue on your current medications as directed. Please refer to the Current Medication list given to you today.   Labwork: none  Testing/Procedures: none  Follow-Up: Your physician wants you to follow-up in: 1 year with Dr Marlou Porch. (December 2018). You will receive a reminder letter in the mail two months in advance. If you don't receive a letter, please call our office to schedule the follow-up appointment.        If you need a refill on your cardiac medications before your next appointment, please call your pharmacy.

## 2016-11-13 NOTE — Progress Notes (Signed)
Cardiology Office Note    Date:  11/13/2016   ID:  Glen Green, DOB Apr 20, 1941, MRN [id number]  PCP:  Donnie Coffin, MD  Cardiologist:   Candee Furbish, MD     History of Present Illness:  Glen Green is a 75 y.o. male here for follow-up, coronary artery disease status post CABG, ischemic cardiomyopathy 30-35% EF. Emergency CABG 2 on 07/31/13, occluded LAD, occluded circumflex, cardiogenic shock. This was in the setting of acute myocardial infarction.  He had postop atrial fibrillation, brief, brief amiodarone. Prolonged recovery.  His ejection fraction originally was 25% but currently 35%.  Back in 2004, had cancer, in 2014, lung carcinoma followed by Dr. 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  He also has a 4 cm abdominal aortic aneurysm followed by Dr. Trula Slade.  Decided for no ICD after consultation with Dr. Caryl Comes, EP.  Previously the requestioned about bluish pigmentation of nose, unlikely secondary to short course of amiodarone.  Playing golf.Overall walking well. Denies any chest pain, anginal symptoms. Mild shortness of breath at baseline. He knows how to pace himself.  He recently started to explore [facility] options.      Past Medical History:  Diagnosis Date  . AAA (abdominal aortic aneurysm) ([place]) 08/22/2012   3.9 cm by CT  - June 2013, stable   . 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 Atrial fibrillation ([place])   . CAD (coronary artery disease)   . Cancer ([place])    Esophageal, adrenal gland, skin; lung  . Carotid artery occlusion   . Cerebral aneurysm    TX. repair  1994  . Dyslipidemia   . History of esophageal cancer    s/p transhiatal esophagogastrectomy  . History of lung cancer   . Hypothyroidism   . Pneumonia   . Postoperative atrial fibrillation ([place]) 12/04/2013   Short course of amiodarone, resolved. Postop bypass  . SBO (small bowel  obstruction) 08/15/2012   History of small bowel obstruction (status post small bowel       resection, lysis of adhesions, incidental appendectomy, and repair       of left diaphragmatic hernia  . Stroke [facility]) 1995   denies residual    Past Surgical History:  Procedure Laterality Date  . BRAIN SURGERY     brain aneursyn  . CHOLECYSTECTOMY    . CORONARY ARTERY BYPASS GRAFT N/A 07/30/2013   Procedure: CORONARY ARTERY BYPASS GRAFTING (CABG) times two on pump using left internal mammary artery and left greater saphenous vein via endovein harvest.;  Surgeon: Gaye Pollack, MD;  [facility]: [facility];  Service: Open Heart Surgery;  Laterality: N/A;  . Exploratory laparotomy, lysis of adhesions, reduce of incarcerated small bowel and colon from the chest, limited small bowel resection, incidental appendectomy, and then repair of diaphragmatic hernia with AlloDerm mesh  10/27/2009   [place]  . Exploratory laparotomy,exploratory thoracotomy for hemorrhage  02/11/2003   [place]  . LEFT HEART CATHETERIZATION WITH CORONARY ANGIOGRAM N/A 07/30/2013   Procedure: LEFT HEART CATHETERIZATION WITH CORONARY ANGIOGRAM;  Surgeon: Peter M Martinique, MD;  Location: [facility] CATH LAB;  Service: Cardiovascular;  Laterality: N/A;  . Left subclavian Port- A-Cath insertion  03/09/2004   [place]ass[place] Done  . left upper lobectomy with node dissection  02/24/2010   [place]  . TONSILLECTOMY    . Transhiatal esophagectomy with cholecystectomy, jejunostomy,  pyloroplasty and removal of right subclavian Port-A- Cath  [place]    Current Medications: Outpatient Medications Prior to Visit  Medication Sig Dispense Refill  . aspirin 81 MG tablet Take 1 tablet (81 mg total) by mouth daily. 30 tablet 0  . atorvastatin (LIPITOR) 40 MG tablet TAKE 1 TABLET BY MOUTH EVERY DAY AT 6PM 90 tablet 2  . carvedilol (COREG) 12.5 MG tablet TAKE 1 TABLET (12.5 MG TOTAL) BY MOUTH 2 (TWO) TIMES DAILY. 180 tablet 1  . guaiFENesin (MUCINEX) 600 MG 12 hr tablet Take  1,200 mg by mouth 2 (two) times daily as needed for congestion.    Marland Kitchen lisinopril (PRINIVIL,ZESTRIL) 5 MG tablet TAKE 1 TABLET (5 MG TOTAL) BY MOUTH DAILY. 90 tablet 0   No facility-administered medications prior to visit.      Allergies:   Patient has no known allergies.   Social History   Social History  . Marital status: Married    Spouse name: N/A  . Number of children: N/A  . Years of education: N/A   Occupational History  . retired    Social History Main Topics  . Smoking status: Former Smoker    Types: Cigarettes    Quit date: 12/10/2002  . Smokeless tobacco: Never Used  . Alcohol use No  . Drug use: No  . Sexual activity: Not Currently   Other Topics Concern  . None   Social History Narrative   Pt lives with wife.      Family History:  The patient's family history includes Aneurysm in his maternal grandmother and mother; Diabetes in his sister; Heart disease in his maternal uncle and paternal uncle; Kidney disease in his father.   ROS:   Please see the history of present illness.   Easy bruising ROS All other systems reviewed and are negative.   PHYSICAL EXAM:   VS:  BP 118/60   Pulse 62   Ht '5\' 10"'$  (1.778 m)   Wt 144 lb 9.6 oz (65.6 kg)   BMI 20.75 kg/m    GEN: Thin, in no acute distress  HEENT: normal  Neck: no JVD, carotid bruits, or masses Cardiac: RRR; no murmurs, rubs, or gallops,no edema  Respiratory:  clear to auscultation bilaterally, normal work of breathing GI: soft, nontender, nondistended, + BS MS: no deformity or atrophy  Skin: warm and dry, no rash Neuro:  Alert and Oriented x 3, Strength and sensation are intact Psych: euthymic mood, full affect  Wt Readings from Last 3 Encounters:  11/13/16 144 lb 9.6 oz (65.6 kg)  09/03/16 143 lb 14.4 oz (65.3 kg)  08/09/16 137 lb (62.1 kg)      Studies/Labs Reviewed:   EKG:  EKG is ordered today.  11/13/16-sinus rhythm, first-degree AV block, PR interval 284 ms, poor R-wave progression,  nonspecific ST-T wave changes, mild inversion noted inferior leads as well as lateral leads. Personally viewed  Recent Labs: No results found for requested labs within last 8760 hours.   Lipid Panel    Component Value Date/Time   CHOL 92 07/31/2013 0500   TRIG 73 07/31/2013 0500   HDL 35 (L) 07/31/2013 0500   CHOLHDL 2.6 07/31/2013 0500   VLDL 15 07/31/2013 0500   LDLCALC 42 07/31/2013 0500    Additional studies/ records that were reviewed today include:  Prior office notes reviewed, lab work reviewed, EKG reviewed    ASSESSMENT:    1. Chronic systolic heart failure ([place])   2. Ischemic cardiomyopathy   3. Abdominal aortic aneurysm (AAA) without rupture ([place])   4.  Pure hypercholesterolemia   5. Postoperative atrial fibrillation (H[facility])   6. Hypertensive heart disease with heart failure ([place])   7. Atherosclerosis of native coronary artery of native heart without angina pectoris      PLAN:  In order of problems listed above:  Coronary artery disease  - Post emergent bypass, cardiogenic shock, MI  - Doing well, aggressive secondary prevention.  Postoperative atrial fibrillation  - Brief amiodarone, no further episodes.  Dyslipidemia  - Atorvastatin-LDL 70, liver functions normal, tolerating medications well without symptoms  Ischemic cardiomyopathy  - Ejection fraction ranging from 25-35%-NYHA 1-2 symptoms.  - Blood pressure has been an issue, low.  - Continuing with beta blocker and ACE inhibitor.  First-degree AV block  - Monitor closely with beta blocker.  Abdominal aortic aneurysm without rupture  - Dr. Trula Slade has been monitoring. 4 cm  Hypertensive heart disease with heart failure  - Blood pressure has been low in the past. Unable to further titrate medications.  Chronic systolic heart failure  - Medications reviewed. Stable. Decided not to pursue ICD.  Chronic malnutrition  - Good job on gaining weight.   Medication Adjustments/Labs and Tests  Ordered: Current medicines are reviewed at length with the patient today.  Concerns regarding medicines are outlined above.  Medication changes, Labs and Tests ordered today are listed in the Patient Instructions below. Patient Instructions  Medication Instructions:  Your physician recommends that you continue on your current medications as directed. Please refer to the Current Medication list given to you today.   Labwork: none  Testing/Procedures: none  Follow-Up: Your physician wants you to follow-up in: 1 year with Dr Marlou Porch. (December 2018). You will receive a reminder letter in the mail two months in advance. If you don't receive a letter, please call our office to schedule the follow-up appointment.        If you need a refill on your cardiac medications before your next appointment, please call your pharmacy.      Signed, Candee Furbish, MD  11/13/2016 8:29 AM    [facility] [address], [place], [place]  [address] Phone: [phone]; Fax: [phone]

## 2017-01-23 ENCOUNTER — Other Ambulatory Visit: Payer: Self-pay | Admitting: Cardiology

## 2017-02-06 ENCOUNTER — Other Ambulatory Visit: Payer: Self-pay | Admitting: Cardiology

## 2017-04-07 ENCOUNTER — Other Ambulatory Visit: Payer: Self-pay | Admitting: Cardiology

## 2017-09-09 ENCOUNTER — Ambulatory Visit (INDEPENDENT_AMBULATORY_CARE_PROVIDER_SITE_OTHER): Payer: Medicare Other | Admitting: Family

## 2017-09-09 ENCOUNTER — Encounter: Payer: Self-pay | Admitting: Family

## 2017-09-09 ENCOUNTER — Ambulatory Visit (HOSPITAL_COMMUNITY)
Admission: RE | Admit: 2017-09-09 | Discharge: 2017-09-09 | Disposition: A | Discharge status: Home / Self Care | Payer: Medicare Other | Source: Ambulatory Visit | Attending: Family | Admitting: Family

## 2017-09-09 VITALS — BP 110/70 | HR 60 | Temp 96.8°F | Resp 20 | Ht 70.0 in | Wt 143.0 lb

## 2017-09-09 DIAGNOSIS — Z87891 Personal history of nicotine dependence: Secondary | ICD-10-CM | POA: Diagnosis not present

## 2017-09-09 DIAGNOSIS — I714 Abdominal aortic aneurysm, without rupture, unspecified: Secondary | ICD-10-CM

## 2017-09-09 DIAGNOSIS — I739 Peripheral vascular disease, unspecified: Secondary | ICD-10-CM

## 2017-09-09 DIAGNOSIS — R0989 Other specified symptoms and signs involving the circulatory and respiratory systems: Secondary | ICD-10-CM | POA: Diagnosis not present

## 2017-09-09 NOTE — Patient Instructions (Addendum)
Abdominal Aortic Aneurysm Blood pumps away from the heart through tubes (blood vessels) called arteries. Aneurysms are weak or damaged places in the wall of an artery. It bulges out like a balloon. An abdominal aortic aneurysm happens in the main artery of the body (aorta). It can burst or tear, causing bleeding inside the body. This is an emergency. It needs treatment right away. What are the causes? The exact cause is unknown. Things that could cause this problem include:  Fat and other substances building up in the lining of a tube.  Swelling of the walls of a blood vessel.  Certain tissue diseases.  Belly (abdominal) trauma.  An infection in the main artery of the body.  What increases the risk? There are things that make it more likely for you to have an aneurysm. These include:  Being over the age of 75 years old.  Having high blood pressure (hypertension).  Being a male.  Being white.  Being very overweight (obese).  Having a family history of aneurysm.  Using tobacco products.  What are the signs or symptoms? Symptoms depend on the size of the aneurysm and how fast it grows. There may not be symptoms. If symptoms occur, they can include:  Pain (belly, side, lower back, or groin).  Feeling full after eating a small amount of food.  Feeling sick to your stomach (nauseous), throwing up (vomiting), or both.  Feeling a lump in your belly that feels like it is beating (pulsating).  Feeling like you will pass out (faint).  How is this treated?  Medicine to control blood pressure and pain.  Imaging tests to see if the aneurysm gets bigger.  Surgery. How is this prevented? To lessen your chance of getting this condition:  Stop smoking. Stop chewing tobacco.  Limit or avoid alcohol.  Keep your blood pressure, blood sugar, and cholesterol within normal limits.  Eat less salt.  Eat foods low in saturated fats and cholesterol. These are found in animal and  whole dairy products.  Eat more fiber. Fiber is found in whole grains, vegetables, and fruits.  Keep a healthy weight.  Stay active and exercise often.  This information is not intended to replace advice given to you by your health care provider. Make sure you discuss any questions you have with your health care provider. Document Released: 03/23/2013 Document Revised: 05/03/2016 Document Reviewed: 12/26/2012 Elsevier Interactive Patient Education  2017 [employer].     Intermittent Claudication Intermittent claudication is pain in your leg that occurs when you walk or exercise and goes away when you rest. The pain can occur in one or both legs. What are the causes? Intermittent claudication is caused by the buildup of plaque within the major arteries in the body (atherosclerosis). The plaque, which makes arteries stiff and narrow, prevents enough blood from reaching your leg muscles. The pain occurs when you walk or exercise because your muscles need more blood when you are moving and exercising. What increases the risk? Risk factors include:  A family history of atherosclerosis.  A personal history of stroke or heart disease.  Older age.  Being inactive or overweight.  Smoking cigarettes.  Having another health condition such as: ? Diabetes. ? High blood pressure. ? High cholesterol.  What are the signs or symptoms? Your hip or leg may:  Ache.  Cramp.  Feel tight.  Feel weak.  Feel heavy.  Over time, you may feel pain in your calf, thigh, or hip. How is this diagnosed?  Your health care provider may diagnose intermittent claudication based on your symptoms and medical history. Your health care provider may also do tests to learn more about your condition. These may include:  Blood tests.  An ultrasound.  Imaging tests such as angiography, magnetic resonance angiography (MRA), and computed tomography angiography (CTA).  How is this treated? You may be  treated for problems such as:  High blood pressure.  High cholesterol.  Diabetes.  Other treatments may include:  Lifestyle changes such as: ? Starting an exercise program. ? Losing weight. ? Quitting smoking.  Medicines to help restore blood flow through your legs.  Blood vessel surgery (angioplasty) to restore blood flow if your intermittent claudication is caused by severe peripheral artery disease.  Follow these instructions at home:  Manage any other health conditions you have.  Eat a diet low in saturated fats and calories to maintain a healthy weight.  Quit smoking, if you smoke.  Take medicines only as directed by your health care provider.  If your health care provider recommended an exercise program for you, follow it as directed. Your exercise program may involve: ? Walking three or more times a week. ? Walking until you have certain symptoms of intermittent claudication. ? Resting until symptoms go away. ? Gradually increasing walking time to about 50 minutes a day. Contact a health care provider if: Your condition is not getting better or is getting worse. Get help right away if:  You have chest pain.  You have difficulty breathing.  You develop arm weakness.  You have trouble speaking.  Your face begins to droop. This information is not intended to replace advice given to you by your health care provider. Make sure you discuss any questions you have with your health care provider. Document Released: 09/28/2004 Document Revised: 05/03/2016 Document Reviewed: 03/04/2014 Elsevier Interactive Patient Education  2017 [employer].

## 2017-09-09 NOTE — Progress Notes (Signed)
[facility] [place]   CC: Follow up Abdominal Aortic Aneurysm  History of Present Illness  Glen Green is a 76 y.o. (March 08, 1941) male patient of Dr. Trula Slade who returns for evaluation of his abdominal aortic aneurysm. This was reportedly detected in about 2010. It has remained stable in size measuring approximately 4 cm. He denies any abdominal pain or back pain.  The patient has a history of an ischemic heart disease. He presented in August of 2014 with shock and heart failure and was found to have an occluded LAD and circumflex. He underwent emergent cardiac bypass surgery. 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  He has a history of esophageal cancer, status post distal esophagectomy and 2004, followed by chemotherapy. He is also status post right adrenalectomy and left upper lobectomy in 2011, presumably for metastatic disease. He has early satiety due to this.   He suffers from hypercholesterolemia, treated with a statin. He has a history of smoking but quit in 2004. He also has a history of a ruptured cerebral aneurysm.  He had a CTA of his chest in August 2016 as requested by Dr. Servando Snare, results pertaining to AAA are below. He reports claudication in both calves, left more so than right, after walking once around [place], relieved by rest. He walks in [place] at least 5x/week and plays golf.  The patient denies history of stroke or TIA symptoms.  Pt Diabetic: yes, last A1C result on file was 6.9 on 07-30-17.  Pt smoker: former smoker, quit about 2004, states he smoked for about 50 years   Past Medical History:  Diagnosis Date  . AAA (abdominal aortic aneurysm) ([place]) 08/22/2012   3.9 cm by CT  - June 2013, stable   . 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 Atrial fibrillation ([place])   . CAD (coronary artery disease)   . Cancer ([place])    Esophageal, adrenal gland, skin; lung  . Carotid artery occlusion   . Cerebral aneurysm     TX. repair  1994  . Dyslipidemia   . History of esophageal cancer    s/p transhiatal esophagogastrectomy  . History of lung cancer   . Hypothyroidism   . Pneumonia   . Postoperative atrial fibrillation ([place]) 12/04/2013   Short course of amiodarone, resolved. Postop bypass  . SBO (small bowel obstruction) ([place]) 08/15/2012   History of small bowel obstruction (status post small bowel       resection, lysis of adhesions, incidental appendectomy, and repair       of left diaphragmatic hernia  . Stroke [facility]) 1995   denies residual   Past Surgical History:  Procedure Laterality Date  . BRAIN SURGERY     brain aneursyn  . CHOLECYSTECTOMY    . CORONARY ARTERY BYPASS GRAFT N/A 07/30/2013   Procedure: CORONARY ARTERY BYPASS GRAFTING (CABG) times two on pump using left internal mammary artery and left greater saphenous vein via endovein harvest.;  Surgeon: Gaye Pollack, MD;  Location: [facility] OR;  Service: Open Heart Surgery;  Laterality: N/A;  . Exploratory laparotomy, lysis of adhesions, reduce of incarcerated small bowel and colon from the chest, limited small bowel resection, incidental appendectomy, and then repair of diaphragmatic hernia with AlloDerm mesh  10/27/2009   [place]  . Exploratory laparotomy,exploratory thoracotomy for hemorrhage  02/11/2003   [place]  . LEFT HEART CATHETERIZATION WITH CORONARY ANGIOGRAM N/A 07/30/2013   Procedure: LEFT HEART CATHETERIZATION WITH CORONARY ANGIOGRAM;  Surgeon: Peter M Martinique, MD;  Location: [facility] CATH LAB;  Service: Cardiovascular;  Laterality: N/A;  . Left subclavian Port- A-Cath insertion  03/09/2004   [place] Done  . left upper lobectomy with node dissection  02/24/2010   [place]  . TONSILLECTOMY    . Transhiatal esophagectomy with cholecystectomy, jejunostomy,  pyloroplasty and removal of right subclavian Port-A- Cath     [place]   Social History Social History   Social History  . Marital status: Married    Spouse name: N/A  . Number of children: N/A   . Years of education: N/A   Occupational History  . retired    Social History Main Topics  . Smoking status: Former Smoker    Types: Cigarettes    Quit date: 12/10/2002  . Smokeless tobacco: Never Used  . Alcohol use No  . Drug use: No  . Sexual activity: Not Currently   Other Topics Concern  . Not on file   Social History Narrative   Pt lives with wife.    Family History Family History  Problem Relation Age of Onset  . Aneurysm Mother   . Kidney disease Father   . Heart disease Maternal Uncle   . Heart disease Paternal Uncle   . Aneurysm Maternal Grandmother   . Diabetes Sister     Current Outpatient Prescriptions on File Prior to Visit  Medication Sig Dispense Refill  . aspirin 81 MG tablet Take 1 tablet (81 mg total) by mouth daily. 30 tablet 0  . atorvastatin (LIPITOR) 40 MG tablet TAKE 1 TABLET BY MOUTH EVERY DAY AT 6PM 90 tablet 1  . carvedilol (COREG) 12.5 MG tablet TAKE 1 TABLET (12.5 MG TOTAL) BY MOUTH 2 (TWO) TIMES DAILY. 180 tablet 2  . guaiFENesin (MUCINEX) 600 MG 12 hr tablet Take 1,200 mg by mouth 2 (two) times daily as needed for congestion.    Marland Kitchen lisinopril (PRINIVIL,ZESTRIL) 5 MG tablet TAKE 1 TABLET BY MOUTH EVERY DAY 90 tablet 2   No current facility-administered medications on file prior to visit.    No Known Allergies  ROS: See HPI for pertinent positives and negatives.  Physical Examination  Vitals:   09/09/17 0903  BP: 110/70  Pulse: 60  Resp: 20  Temp: (!) 96.8 F (36 C)  TempSrc: Oral  SpO2: 100%  Weight: 143 lb (64.9 kg)  Height: 5\' 10"  (1.778 m)   Body mass index is 20.52 kg/m.  General: A&O x 3, WD, thin male.  Pulmonary: Sym exp, respirations are non labored, good air movt, CTAB, no rales, rhonchi, or wheezing.  Cardiac: RRR, Nl S1, S2, no detected murmur.   Carotid Bruits Right Left   Negative Negative   Abdominal aortic pulse is palpable Radial pulses are 1+ palpable and =   VASCULAR  EXAM:  LE Pulses Right Left  FEMORAL 1+ palpable 2+palpable  POPLITEAL not palpable not palpable  POSTERIOR TIBIAL not palpable not palpable  DORSALIS PEDIS ANTERIOR TIBIAL not palpable not palpable     Gastrointestinal: soft, NTND, -G/R, - HSM, - masses palpated, - CVAT B.  Musculoskeletal: M/S 5/5 throughout, Extremities without ischemic changes.  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  Marland Kitchen  DATA  AAA Duplex (09/09/2017):  Previous size: 4.4 cm (Date: 09/03/16)   Current size:  4.8 cm (Date: 09-09-17); Right CIA: 1.0 cm; Left CIA: 0.9 cm  07/28/15 CTA chest requested by Dr.  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 Status post resection. Additional history of esophageal cancer. Subsequent treatment strategy.  EXAM: CT CHEST WITHOUT CONTRAST  TECHNIQUE: Multidetector CT imaging of the chest was performed following the standard protocol without IV contrast.  COMPARISON: CT 07/22/2014  FINDINGS: Mediastinum/Nodes: No axillary supraclavicular adenopathy. Ascending aorta dilated to 42 mm compared to 41 mm at same level on comparison CT of 07/22/2014. No mediastinal lymphadenopathy.   Medical Decision Making  The patient is a 76 y.o. male who presents with asymptomatic AAA with 4 mm increase in size in a year. Pt had a non contrast CT requested by Dr. Servando Snare as follow up for lung cancer on 08-09-16, no mention of AAA since no contrast used.   He had moderate claudication sx's in both calves, relieved by rest. Pedal pulses are not palpable. There are no signs of ischemia in his feet or legs.   I discussed with Dr. Lorin Mercy pt HPI and results of today's AAA duplex, see Plan.  Serum creatinine was 1.14 on 07-30-17.    Based on  this patient's exam and diagnostic studies, the patient will follow up in 6 months with the following studies: bilateral ABI's, and CTA chest/abd/pelvis and see Dr. Trula Slade afterward.  Consideration for repair of AAA would be made when the size is 5.0 cm, growth > 1 cm/yr, and symptomatic status.        The patient was given information about AAA including signs, symptoms, treatment,  and how to minimize the risk of enlargement and rupture of aneurysms.    I emphasized the importance of maximal medical management including strict control of blood pressure, blood glucose, and lipid levels, antiplatelet agents, obtaining regular exercise, and continued cessation of smoking.   The patient was advised to call 911 should the patient experience sudden onset abdominal or back pain.   Thank you for allowing [place] to participate in this patient's care.  Clemon Chambers, RN, MSN, FNP-C [facility] [place] Office: [place]: Trula Slade  09/09/2017, 9:06 AM

## 2017-10-08 ENCOUNTER — Other Ambulatory Visit: Payer: Self-pay | Admitting: Cardiology

## 2017-10-23 ENCOUNTER — Other Ambulatory Visit: Payer: Self-pay | Admitting: Cardiology

## 2017-10-24 NOTE — Telephone Encounter (Signed)
Medication Detail    Disp Refills Start End   atorvastatin (LIPITOR) 40 MG tablet 90 tablet 0 10/08/2017    Sig - Route: Take 1 tablet (40 mg total) by mouth daily at 6 PM. Please keep upcoming appt for future refills. Thanks - Oral   Sent to pharmacy as: atorvastatin (LIPITOR) 40 MG tablet   E-Prescribing Status: Receipt confirmed by pharmacy (10/08/2017 3:49 PM EDT)   Pharmacy   [id number] - [place], [place].

## 2017-11-01 ENCOUNTER — Other Ambulatory Visit: Payer: Self-pay | Admitting: Cardiology

## 2017-11-04 ENCOUNTER — Other Ambulatory Visit: Payer: Self-pay

## 2017-11-04 MED ORDER — ATORVASTATIN CALCIUM 40 MG PO TABS: 40.0000 mg | ORAL_TABLET | Freq: Every day | ORAL | 0 refills | Status: DC

## 2017-11-16 ENCOUNTER — Other Ambulatory Visit: Payer: Self-pay | Admitting: Cardiology

## 2017-11-18 ENCOUNTER — Ambulatory Visit: Payer: Medicare Other | Admitting: Cardiology

## 2017-12-30 ENCOUNTER — Encounter: Payer: Self-pay | Admitting: Thoracic Surgery

## 2018-01-03 ENCOUNTER — Ambulatory Visit: Payer: Medicare Other | Admitting: Cardiology

## 2018-01-03 ENCOUNTER — Encounter: Payer: Self-pay | Admitting: Cardiology

## 2018-01-03 VITALS — BP 114/68 | HR 65 | Ht 70.0 in | Wt 143.4 lb

## 2018-01-03 DIAGNOSIS — I255 Ischemic cardiomyopathy: Secondary | ICD-10-CM | POA: Diagnosis not present

## 2018-01-03 DIAGNOSIS — I714 Abdominal aortic aneurysm, without rupture, unspecified: Secondary | ICD-10-CM

## 2018-01-03 DIAGNOSIS — I10 Essential (primary) hypertension: Secondary | ICD-10-CM | POA: Diagnosis not present

## 2018-01-03 DIAGNOSIS — I5022 Chronic systolic (congestive) heart failure: Secondary | ICD-10-CM

## 2018-01-03 NOTE — Patient Instructions (Signed)
Medication Instructions:  The current medical regimen is effective;  continue present plan and medications.  Labwork: NONE  Testing/Procedures: NONE  Follow-Up: Follow up in 1 year with Dr. Marlou Porch.  You will receive a letter in the mail 2 months before you are due.  Please call us when you receive this letter to schedule your follow up appointment.  If you need a refill on your cardiac medications before your next appointment, please call your pharmacy.  Thank you for choosing [place]!!

## 2018-01-03 NOTE — Progress Notes (Signed)
Cardiology Office Note    Date:  01/03/2018   ID:  Glen Green, DOB [birth year], MRN [id number]  PCP:  Aurea Graff.Marlou Sa, MD  Cardiologist:   Candee Furbish, MD     History of Present Illness:  Glen Green is a 77 y.o. male here for follow-up, coronary artery disease status post CABG, ischemic cardiomyopathy 30-35% EF. Emergency CABG 2 on 07/31/13, occluded LAD, occluded circumflex, cardiogenic shock. This was in the setting of acute myocardial infarction.  He had postop atrial fibrillation, brief, brief amiodarone. Prolonged recovery.  His ejection fraction originally was 25% but currently 35%.  Back in 2004, had cancer, in 2014, lung carcinoma followed by Dr. 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  He also has a 4 cm abdominal aortic aneurysm followed by Dr. Trula Slade.  Decided for no ICD after consultation with Dr. Caryl Comes, EP.  Previously the requestioned about bluish pigmentation of nose, unlikely secondary to short course of amiodarone.  Playing golf.Overall walking well. Denies any chest pain, anginal symptoms. Mild shortness of breath at baseline. He knows how to pace himself.  He recently [facility] to explore [facility]. Finally signed [facility]. Hearing aid.  [employer].     Past Medical History:  Diagnosis Date  . AAA (abdominal aortic aneurysm) ([place]) 08/22/2012   3.9 cm by CT  - June 2013, stable   . 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 Atrial fibrillation ([place])   . CAD (coronary artery disease)   . Cancer ([place])    Esophageal, adrenal gland, skin; lung  . Carotid artery occlusion   . Cerebral aneurysm    TX. repair  1994  . Dyslipidemia   . History of esophageal cancer    s/p transhiatal esophagogastrectomy  . History of lung cancer   . Hypothyroidism   . Pneumonia   . Postoperative atrial fibrillation ([place]) 12/04/2013   Short course of amiodarone,  resolved. Postop bypass  . SBO (small bowel obstruction) ([place]) 08/15/2012   History of small bowel obstruction (status post small bowel       resection, lysis of adhesions, incidental appendectomy, and repair       of left diaphragmatic hernia  . Stroke [facility]) 1995   denies residual    Past Surgical History:  Procedure Laterality Date  . BRAIN SURGERY     brain aneursyn  . CHOLECYSTECTOMY    . CORONARY ARTERY BYPASS GRAFT N/A 07/30/2013   Procedure: CORONARY ARTERY BYPASS GRAFTING (CABG) times two on pump using left internal mammary artery and left greater saphenous vein via endovein harvest.;  Surgeon: Gaye Pollack, MD;  Location: [facility] OR;  Service: Open Heart Surgery;  Laterality: N/A;  . Exploratory laparotomy, lysis of adhesions, reduce of incarcerated small bowel and colon from the chest, limited small bowel resection, incidental appendectomy, and then repair of diaphragmatic hernia with AlloDerm mesh  10/27/2009   [place]  . Exploratory laparotomy,exploratory thoracotomy for hemorrhage  02/11/2003   [place]  . LEFT HEART CATHETERIZATION WITH CORONARY ANGIOGRAM N/A 07/30/2013   Procedure: LEFT HEART CATHETERIZATION WITH CORONARY ANGIOGRAM;  Surgeon: Peter M Martinique, MD;  Location: [facility] CATH LAB;  Service: Cardiovascular;  Laterality: N/A;  . Left subclavian Port- A-Cath insertion  03/09/2004   [place] Done  . left upper lobectomy with node dissection  02/24/2010   [place]  . TONSILLECTOMY    . Transhiatal esophagectomy with cholecystectomy, jejunostomy,  pyloroplasty and removal of  right subclavian Port-A- Cath     [place]    Current Medications: Outpatient Medications Prior to Visit  Medication Sig Dispense Refill  . aspirin 81 MG tablet Take 1 tablet (81 mg total) by mouth daily. 30 tablet 0  . atorvastatin (LIPITOR) 40 MG tablet Take 1 tablet (40 mg total) by mouth daily at 6 PM. Please keep upcoming appt for future refills. Thanks 30 tablet 0  . carvedilol (COREG) 12.5 MG tablet TAKE 1  TABLET (12.5 MG TOTAL) BY MOUTH 2 (TWO) TIMES DAILY. Please keep upcoming appt in December for future refills. Thanks 180 tablet 0  . guaiFENesin (MUCINEX) 600 MG 12 hr tablet Take 1,200 mg by mouth 2 (two) times daily as needed for congestion.    Marland Kitchen lisinopril (PRINIVIL,ZESTRIL) 5 MG tablet TAKE 1 TABLET BY MOUTH EVERY DAY 90 tablet 3   No facility-administered medications prior to visit.      Allergies:   Patient has no known allergies.   Social History   Socioeconomic History  . Marital status: Married    Spouse name: None  . Number of children: None  . Years of education: None  . Highest education level: None  Social Needs  . Financial resource strain: None  . Food insecurity - worry: None  . Food insecurity - inability: None  . Transportation needs - medical: None  . Transportation needs - non-medical: None  Occupational History  . Occupation: retired  Tobacco Use  . Smoking status: Former Smoker    Types: Cigarettes    Last attempt to quit: 12/10/2002    Years since quitting: 15.0  . Smokeless tobacco: Never Used  Substance and Sexual Activity  . Alcohol use: No  . Drug use: No  . Sexual activity: Not Currently  Other Topics Concern  . None  Social History Narrative   Pt lives with wife.      Family History:  The patient's family history includes Aneurysm in his maternal grandmother and mother; Diabetes in his sister; Heart disease in his maternal uncle and paternal uncle; Kidney disease in his father.   ROS:   Please see the history of present illness.   Easy bruising Review of Systems  All other systems reviewed and are negative.     PHYSICAL EXAM:   VS:  BP 114/68   Pulse 65   Ht 5\' 10"  (1.778 m)   Wt 143 lb 6.4 oz (65 kg)   SpO2 99%   BMI 20.58 kg/m    GEN: Thin, well developed, in no acute distress  HEENT: normal  Neck: no JVD, carotid bruits, or masses Cardiac: RRR; 1/6 SM LLSB, no rubs, or gallops,no edema  Respiratory:  clear to auscultation  bilaterally, normal work of breathing GI: soft, nontender, nondistended, + BS MS: no deformity or atrophy  Skin: warm and dry, no rash Neuro:  Alert and Oriented x 3, Strength and sensation are intact Psych: euthymic mood, full affect   Wt Readings from Last 3 Encounters:  01/03/18 143 lb 6.4 oz (65 kg)  09/09/17 143 lb (64.9 kg)  11/13/16 144 lb 9.6 oz (65.6 kg)      Studies/Labs Reviewed:   EKG:  EKG is ordered today.  01/03/18 - sinus rhythm heart rate 65 first-degree AV block 300 ms, LBBB like12/5/17-sinus rhythm, first-degree AV block, PR interval 284 ms, poor R-wave progression, nonspecific ST-T wave changes, mild inversion noted inferior leads as well as lateral leads. Personally viewed  ECHO: EF 30%  Recent Labs:  No results found for requested labs within last 8760 hours.   Lipid Panel    Component Value Date/Time   CHOL 92 07/31/2013 0500   TRIG 73 07/31/2013 0500   HDL 35 (L) 07/31/2013 0500   CHOLHDL 2.6 07/31/2013 0500   VLDL 15 07/31/2013 0500   LDLCALC 42 07/31/2013 0500    Additional studies/ records that were reviewed today include:  Prior office notes reviewed, lab work reviewed, EKG reviewed    ASSESSMENT:    1. Essential hypertension, benign   2. Chronic systolic heart failure (HCC)   3. Abdominal aortic aneurysm (AAA) without rupture ([place])   4. Ischemic cardiomyopathy      PLAN:  In order of problems listed above:  Coronary artery disease  - Post emergent bypass, cardiogenic shock, MI, no angina  - Doing well, aggressive secondary prevention.  Postoperative atrial fibrillation  - Brief amiodarone, no further episodes. No occurrence.   Dyslipidemia  - Atorvastatin-LDL 70, liver functions normal, tolerating medications well without symptoms  Ischemic cardiomyopathy  - Ejection fraction ranging from 25-35%-NYHA 1-2 symptoms.  - Blood pressure has been an issue, low.  - Continuing with beta blocker and ACE inhibitor.  First-degree AV  block  - Monitor closely with beta blocker., no changes. No syncope.   Abdominal aortic aneurysm without rupture  - Dr. Trula Slade has been monitoring. 4 cm - Stable  Hypertensive heart disease with heart failure  - Blood pressure has been low in the past. Unable to further titrate medications.  Chronic systolic heart failure  - Medications reviewed. Stable. Decided not to pursue ICD.  Chronic malnutrition  - Good job on gaining weight.   Medication Adjustments/Labs and Tests Ordered: Current medicines are reviewed at length with the patient today.  Concerns regarding medicines are outlined above.  Medication changes, Labs and Tests ordered today are listed in the Patient Instructions below. Patient Instructions  Medication Instructions:  The current medical regimen is effective;  continue present plan and medications.  Labwork: NONE  Testing/Procedures: NONE  Follow-Up: Follow up in 1 year with Dr. Marlou Porch.  You will receive a letter in the mail 2 months before you are due.  Please call us when you receive this letter to schedule your follow up appointment.  If you need a refill on your cardiac medications before your next appointment, please call your pharmacy.  Thank you for choosing [facility]!!        Signed, Candee Furbish, MD  01/03/2018 10:31 AM    [facility] [address], [place], [place]  [address] Phone: [phone]; Fax: [phone]

## 2018-01-09 ENCOUNTER — Other Ambulatory Visit: Payer: Self-pay

## 2018-01-09 DIAGNOSIS — I713 Abdominal aortic aneurysm, ruptured, unspecified: Secondary | ICD-10-CM

## 2018-01-09 DIAGNOSIS — I739 Peripheral vascular disease, unspecified: Secondary | ICD-10-CM

## 2018-01-31 ENCOUNTER — Other Ambulatory Visit: Payer: Self-pay | Admitting: Cardiology

## 2018-02-19 ENCOUNTER — Other Ambulatory Visit: Payer: Self-pay | Admitting: Dermatology

## 2018-03-03 ENCOUNTER — Ambulatory Visit
Admission: RE | Admit: 2018-03-03 | Discharge: 2018-03-03 | Disposition: A | Discharge status: Home / Self Care | Payer: Medicare Other | Source: Ambulatory Visit | Attending: Surgery | Admitting: Surgery

## 2018-03-03 DIAGNOSIS — I713 Abdominal aortic aneurysm, ruptured, unspecified: Secondary | ICD-10-CM

## 2018-03-03 MED ORDER — IOPAMIDOL (ISOVUE-370) INJECTION 76%
75.0000 mL | Freq: Once | INTRAVENOUS | Status: AC | PRN
  Administered 2018-03-03: 75 mL via INTRAVENOUS

## 2018-03-08 ENCOUNTER — Encounter (HOSPITAL_COMMUNITY): Payer: Self-pay | Admitting: *Deleted

## 2018-03-08 ENCOUNTER — Inpatient Hospital Stay (HOSPITAL_COMMUNITY)
Admission: EM | Admit: 2018-03-08 | Discharge: 2018-03-11 | DRG: 291 | Disposition: A | Discharge status: Home / Self Care | Payer: Medicare Other | Attending: Internal Medicine | Admitting: Internal Medicine

## 2018-03-08 ENCOUNTER — Other Ambulatory Visit: Payer: Self-pay

## 2018-03-08 ENCOUNTER — Emergency Department (HOSPITAL_COMMUNITY): Payer: Medicare Other

## 2018-03-08 DIAGNOSIS — I447 Left bundle-branch block, unspecified: Secondary | ICD-10-CM | POA: Diagnosis present

## 2018-03-08 DIAGNOSIS — E039 Hypothyroidism, unspecified: Secondary | ICD-10-CM | POA: Diagnosis present

## 2018-03-08 DIAGNOSIS — J449 Chronic obstructive pulmonary disease, unspecified: Secondary | ICD-10-CM | POA: Diagnosis present

## 2018-03-08 DIAGNOSIS — I712 Thoracic aortic aneurysm, without rupture: Secondary | ICD-10-CM | POA: Diagnosis present

## 2018-03-08 DIAGNOSIS — I110 Hypertensive heart disease with heart failure: Secondary | ICD-10-CM | POA: Diagnosis present

## 2018-03-08 DIAGNOSIS — I255 Ischemic cardiomyopathy: Secondary | ICD-10-CM | POA: Diagnosis present

## 2018-03-08 DIAGNOSIS — E785 Hyperlipidemia, unspecified: Secondary | ICD-10-CM | POA: Diagnosis present

## 2018-03-08 DIAGNOSIS — Z7982 Long term (current) use of aspirin: Secondary | ICD-10-CM | POA: Diagnosis not present

## 2018-03-08 DIAGNOSIS — I340 Nonrheumatic mitral (valve) insufficiency: Secondary | ICD-10-CM | POA: Diagnosis not present

## 2018-03-08 DIAGNOSIS — E440 Moderate protein-calorie malnutrition: Secondary | ICD-10-CM | POA: Diagnosis present

## 2018-03-08 DIAGNOSIS — Z8673 Personal history of transient ischemic attack (TIA), and cerebral infarction without residual deficits: Secondary | ICD-10-CM

## 2018-03-08 DIAGNOSIS — D638 Anemia in other chronic diseases classified elsewhere: Secondary | ICD-10-CM | POA: Diagnosis present

## 2018-03-08 DIAGNOSIS — Z902 Acquired absence of lung [part of]: Secondary | ICD-10-CM | POA: Diagnosis not present

## 2018-03-08 DIAGNOSIS — Z8249 Family history of ischemic heart disease and other diseases of the circulatory system: Secondary | ICD-10-CM | POA: Diagnosis not present

## 2018-03-08 DIAGNOSIS — I5022 Chronic systolic (congestive) heart failure: Secondary | ICD-10-CM | POA: Diagnosis not present

## 2018-03-08 DIAGNOSIS — Z87891 Personal history of nicotine dependence: Secondary | ICD-10-CM | POA: Diagnosis not present

## 2018-03-08 DIAGNOSIS — Z9049 Acquired absence of other specified parts of digestive tract: Secondary | ICD-10-CM

## 2018-03-08 DIAGNOSIS — I714 Abdominal aortic aneurysm, without rupture, unspecified: Secondary | ICD-10-CM | POA: Diagnosis present

## 2018-03-08 DIAGNOSIS — I5023 Acute on chronic systolic (congestive) heart failure: Secondary | ICD-10-CM | POA: Diagnosis present

## 2018-03-08 DIAGNOSIS — I2510 Atherosclerotic heart disease of native coronary artery without angina pectoris: Secondary | ICD-10-CM | POA: Diagnosis present

## 2018-03-08 DIAGNOSIS — Z8501 Personal history of malignant neoplasm of esophagus: Secondary | ICD-10-CM

## 2018-03-08 DIAGNOSIS — Z85118 Personal history of other malignant neoplasm of bronchus and lung: Secondary | ICD-10-CM | POA: Diagnosis not present

## 2018-03-08 DIAGNOSIS — R0602 Shortness of breath: Secondary | ICD-10-CM | POA: Diagnosis not present

## 2018-03-08 DIAGNOSIS — I272 Pulmonary hypertension, unspecified: Secondary | ICD-10-CM | POA: Diagnosis present

## 2018-03-08 DIAGNOSIS — J439 Emphysema, unspecified: Secondary | ICD-10-CM | POA: Diagnosis not present

## 2018-03-08 DIAGNOSIS — I739 Peripheral vascular disease, unspecified: Secondary | ICD-10-CM | POA: Diagnosis present

## 2018-03-08 DIAGNOSIS — I361 Nonrheumatic tricuspid (valve) insufficiency: Secondary | ICD-10-CM | POA: Diagnosis not present

## 2018-03-08 DIAGNOSIS — N179 Acute kidney failure, unspecified: Secondary | ICD-10-CM | POA: Diagnosis present

## 2018-03-08 DIAGNOSIS — J9601 Acute respiratory failure with hypoxia: Secondary | ICD-10-CM | POA: Diagnosis present

## 2018-03-08 DIAGNOSIS — I4891 Unspecified atrial fibrillation: Secondary | ICD-10-CM | POA: Diagnosis present

## 2018-03-08 DIAGNOSIS — Z951 Presence of aortocoronary bypass graft: Secondary | ICD-10-CM

## 2018-03-08 DIAGNOSIS — J9690 Respiratory failure, unspecified, unspecified whether with hypoxia or hypercapnia: Secondary | ICD-10-CM

## 2018-03-08 DIAGNOSIS — I9789 Other postprocedural complications and disorders of the circulatory system, not elsewhere classified: Secondary | ICD-10-CM

## 2018-03-08 LAB — COMPREHENSIVE METABOLIC PANEL
ALT: 13 U/L — ABNORMAL LOW (ref 17–63)
ANION GAP: 8 (ref 5–15)
AST: 23 U/L (ref 15–41)
Albumin: 3.3 g/dL — ABNORMAL LOW (ref 3.5–5.0)
Alkaline Phosphatase: 79 U/L (ref 38–126)
BILIRUBIN TOTAL: 0.8 mg/dL (ref 0.3–1.2)
BUN: 21 mg/dL — AB (ref 6–20)
CO2: 26 mmol/L (ref 22–32)
Calcium: 9.1 mg/dL (ref 8.9–10.3)
Chloride: 106 mmol/L (ref 101–111)
Creatinine, Ser: 1.31 mg/dL — ABNORMAL HIGH (ref 0.61–1.24)
GFR, EST AFRICAN AMERICAN: 60 mL/min — AB (ref >60)
GFR, EST NON AFRICAN AMERICAN: 52 mL/min — AB (ref >60)
Glucose, Bld: 92 mg/dL (ref 65–99)
POTASSIUM: 4.3 mmol/L (ref 3.5–5.1)
Sodium: 140 mmol/L (ref 135–145)
TOTAL PROTEIN: 6.5 g/dL (ref 6.5–8.1)

## 2018-03-08 LAB — CBC
HCT: 38.5 % — ABNORMAL LOW (ref 39.0–52.0)
HEMOGLOBIN: 12.4 g/dL — AB (ref 13.0–17.0)
MCH: 31 pg (ref 26.0–34.0)
MCHC: 32.2 g/dL (ref 30.0–36.0)
MCV: 96.3 fL (ref 78.0–100.0)
Platelets: 192 10*3/uL (ref 150–400)
RBC: 4 MIL/uL — ABNORMAL LOW (ref 4.22–5.81)
RDW: 15.2 % (ref 11.5–15.5)
WBC: 7.2 10*3/uL (ref 4.0–10.5)

## 2018-03-08 LAB — CBC WITH DIFFERENTIAL/PLATELET
BASOS ABS: 0 10*3/uL (ref 0.0–0.1)
Basophils Relative: 0 %
Eosinophils Absolute: 0.2 10*3/uL (ref 0.0–0.7)
Eosinophils Relative: 4 %
HCT: 39.8 % (ref 39.0–52.0)
HEMOGLOBIN: 12.7 g/dL — AB (ref 13.0–17.0)
LYMPHS PCT: 15 %
Lymphs Abs: 0.8 10*3/uL (ref 0.7–4.0)
MCH: 31.1 pg (ref 26.0–34.0)
MCHC: 31.9 g/dL (ref 30.0–36.0)
MCV: 97.3 fL (ref 78.0–100.0)
Monocytes Absolute: 0.8 10*3/uL (ref 0.1–1.0)
Monocytes Relative: 15 %
NEUTROS PCT: 66 %
Neutro Abs: 3.6 10*3/uL (ref 1.7–7.7)
Platelets: 199 10*3/uL (ref 150–400)
RBC: 4.09 MIL/uL — AB (ref 4.22–5.81)
RDW: 15.4 % (ref 11.5–15.5)
WBC: 5.4 10*3/uL (ref 4.0–10.5)

## 2018-03-08 LAB — I-STAT TROPONIN, ED: TROPONIN I, POC: 0 ng/mL (ref 0.00–0.08)

## 2018-03-08 LAB — BRAIN NATRIURETIC PEPTIDE: B NATRIURETIC PEPTIDE 5: 733.9 pg/mL — AB (ref 0.0–100.0)

## 2018-03-08 MED ORDER — ACETAMINOPHEN 325 MG PO TABS: 650.0000 mg | ORAL_TABLET | Freq: Four times a day (QID) | ORAL | Status: DC | PRN

## 2018-03-08 MED ORDER — ONDANSETRON HCL 4 MG PO TABS: 4.0000 mg | ORAL_TABLET | Freq: Four times a day (QID) | ORAL | Status: DC | PRN

## 2018-03-08 MED ORDER — DIPHENHYDRAMINE-APAP (SLEEP) 25-500 MG PO TABS: 2.0000 | ORAL_TABLET | Freq: Every evening | ORAL | Status: DC | PRN

## 2018-03-08 MED ORDER — LISINOPRIL 5 MG PO TABS
5.0000 mg | ORAL_TABLET | Freq: Every day | ORAL | Status: DC
  Administered 2018-03-09 – 2018-03-11 (×3): 5 mg via ORAL
  Filled 2018-03-08 (×3): qty 1

## 2018-03-08 MED ORDER — ALBUTEROL SULFATE (2.5 MG/3ML) 0.083% IN NEBU: 2.5000 mg | INHALATION_SOLUTION | RESPIRATORY_TRACT | Status: DC | PRN

## 2018-03-08 MED ORDER — IPRATROPIUM BROMIDE 0.02 % IN SOLN: 0.5000 mg | RESPIRATORY_TRACT | Status: DC

## 2018-03-08 MED ORDER — METHYLPREDNISOLONE SODIUM SUCC 125 MG IJ SOLR
125.0000 mg | Freq: Once | INTRAMUSCULAR | Status: AC
  Administered 2018-03-08: 125 mg via INTRAVENOUS
  Filled 2018-03-08: qty 2

## 2018-03-08 MED ORDER — ENOXAPARIN SODIUM 40 MG/0.4ML ~~LOC~~ SOLN
40.0000 mg | SUBCUTANEOUS | Status: DC
  Administered 2018-03-08 – 2018-03-10 (×3): 40 mg via SUBCUTANEOUS
  Filled 2018-03-08 (×3): qty 0.4

## 2018-03-08 MED ORDER — ATORVASTATIN CALCIUM 40 MG PO TABS
40.0000 mg | ORAL_TABLET | Freq: Every day | ORAL | Status: DC
  Administered 2018-03-09 – 2018-03-10 (×2): 40 mg via ORAL
  Filled 2018-03-08 (×2): qty 1

## 2018-03-08 MED ORDER — GUAIFENESIN ER 600 MG PO TB12: 1200.0000 mg | ORAL_TABLET | Freq: Two times a day (BID) | ORAL | Status: DC | PRN

## 2018-03-08 MED ORDER — ONDANSETRON HCL 4 MG/2ML IJ SOLN: 4.0000 mg | Freq: Four times a day (QID) | INTRAMUSCULAR | Status: DC | PRN

## 2018-03-08 MED ORDER — ASPIRIN EC 81 MG PO TBEC
81.0000 mg | DELAYED_RELEASE_TABLET | Freq: Every day | ORAL | Status: DC
  Administered 2018-03-08 – 2018-03-11 (×4): 81 mg via ORAL
  Filled 2018-03-08 (×4): qty 1

## 2018-03-08 MED ORDER — IPRATROPIUM-ALBUTEROL 0.5-2.5 (3) MG/3ML IN SOLN
3.0000 mL | RESPIRATORY_TRACT | Status: DC
  Administered 2018-03-08 – 2018-03-09 (×4): 3 mL via RESPIRATORY_TRACT
  Filled 2018-03-08 (×4): qty 3

## 2018-03-08 MED ORDER — ACETAMINOPHEN 650 MG RE SUPP: 650.0000 mg | Freq: Four times a day (QID) | RECTAL | Status: DC | PRN

## 2018-03-08 MED ORDER — ALBUTEROL SULFATE (2.5 MG/3ML) 0.083% IN NEBU: 2.5000 mg | INHALATION_SOLUTION | RESPIRATORY_TRACT | Status: DC

## 2018-03-08 MED ORDER — BUDESONIDE 0.25 MG/2ML IN SUSP
0.2500 mg | Freq: Two times a day (BID) | RESPIRATORY_TRACT | Status: DC
  Administered 2018-03-09 – 2018-03-11 (×5): 0.25 mg via RESPIRATORY_TRACT
  Filled 2018-03-08 (×7): qty 2

## 2018-03-08 MED ORDER — CARVEDILOL 12.5 MG PO TABS
12.5000 mg | ORAL_TABLET | Freq: Two times a day (BID) | ORAL | Status: DC
  Administered 2018-03-09 – 2018-03-11 (×5): 12.5 mg via ORAL
  Filled 2018-03-08 (×5): qty 1

## 2018-03-08 MED ORDER — IPRATROPIUM-ALBUTEROL 0.5-2.5 (3) MG/3ML IN SOLN
3.0000 mL | Freq: Once | RESPIRATORY_TRACT | Status: AC
  Administered 2018-03-08: 3 mL via RESPIRATORY_TRACT
  Filled 2018-03-08: qty 3

## 2018-03-08 NOTE — ED Triage Notes (Signed)
PT  Reported [facility] today with  Any activity.  Pt reported he played golf on Friday and had no problems. Pt reported today he started the [facility] with any activity. PT arrived  With nasal O2 / 2 liters. Pt reports Lung CA  In past with a lobectomy.

## 2018-03-08 NOTE — ED Provider Notes (Signed)
[facility] EMERGENCY DEPARTMENT Provider Note   CSN: [id number] Arrival date & time: 03/08/18  1654     History   Chief Complaint Chief Complaint  Patient presents with  . Shortness of Breath    HPI Glen Green is a 77 y.o. male.  77 year old male with prior history of AAA, CAD, lung cancer, and dyslipidemia of breath.  Patient reports that over the last week he has had progressive shortness of breath.  Patient denies associated cough or fever.  He denies associated chest pain.  Shortness of breath is worse with exertion.  At rest he feels fairly comfortable.  Patient denies back pain,, pain, or other acute complaint.  The history is provided by the patient and a relative.  Shortness of Breath  This is a new problem. The average episode lasts 6 days. The problem occurs rarely.The current episode started more than 2 days ago. The problem has not changed since onset.Pertinent negatives include no fever, no cough, no wheezing and no chest pain. He has tried nothing for the symptoms. The treatment provided no relief. He has had no prior hospitalizations. He has had no prior ED visits. He has had no prior ICU admissions.    Past Medical History:  Diagnosis Date  . AAA (abdominal aortic aneurysm) ([place]) 08/22/2012   3.9 cm by CT  - June 2013, stable   . 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 Atrial fibrillation ([place])   . CAD (coronary artery disease)   . Cancer ([place])    Esophageal, adrenal gland, skin; lung  . Carotid artery occlusion   . Cerebral aneurysm    TX. repair  1994  . Dyslipidemia   . History of esophageal cancer    s/p transhiatal esophagogastrectomy  . History of lung cancer   . Hypothyroidism   . Pneumonia   . Postoperative atrial fibrillation ([place]) 12/04/2013   Short course of amiodarone, resolved. Postop bypass  . SBO (small bowel obstruction) ([place]) 08/15/2012   History of small bowel obstruction (status post small bowel        resection, lysis of adhesions, incidental appendectomy, and repair       of left diaphragmatic hernia  . Stroke [facility]) 1995   denies residual    Patient Active Problem List   Diagnosis Date Noted  . Chronic systolic heart failure ([place]) 09/13/2014  . Abdominal aneurysm without mention of rupture 08/23/2014  . Cardiomyopathy, ischemic 06/16/2014  . Essential hypertension, benign 06/16/2014  . Pure hypercholesterolemia 06/16/2014  . Postoperative atrial fibrillation ([place]) 12/04/2013  . Coronary atherosclerosis of native coronary artery 09/09/2013  . NSTEMI (non-ST elevated myocardial infarction) ([place]) 07/30/2013  . Acute systolic heart failure ([place]) 07/30/2013  . AAA (abdominal aortic aneurysm) ([place]) 08/22/2012  . Bilateral hearing loss 08/22/2012  . PVD (peripheral vascular disease) ([employer]) 08/22/2012  . Preventative health care 08/15/2012  . 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 SBO (small bowel obstruction) ([place]) 08/15/2012  . Hypokalemia 12/09/2011  . Weakness generalized 12/05/2011  . Pneumonia due to Hemophilus influenza 12/05/2011  . Protein-calorie malnutrition, moderate ([place]) 12/05/2011  . Hyponatremia 12/05/2011  . Dyslipidemia   . History of esophageal cancer   . Hypothyroidism   . Cerebral aneurysm   . History of lung cancer     Past Surgical History:  Procedure Laterality Date  . BRAIN SURGERY     brain aneursyn  . CHOLECYSTECTOMY    . CORONARY ARTERY BYPASS GRAFT N/A 07/30/2013  Procedure: CORONARY ARTERY BYPASS GRAFTING (CABG) times two on pump using left internal mammary artery and left greater saphenous vein via endovein harvest.;  Surgeon: Gaye Pollack, MD;  Location: [facility] OR;  Service: Open Heart Surgery;  Laterality: N/A;  . Exploratory laparotomy, lysis of adhesions, reduce of incarcerated small bowel and colon from the chest, limited small bowel resection, incidental appendectomy, and then repair of diaphragmatic hernia with AlloDerm mesh  10/27/2009   [place]  .  Exploratory laparotomy,exploratory thoracotomy for hemorrhage  02/11/2003   [place]  . LEFT HEART CATHETERIZATION WITH CORONARY ANGIOGRAM N/A 07/30/2013   Procedure: LEFT HEART CATHETERIZATION WITH CORONARY ANGIOGRAM;  Surgeon: Peter M Martinique, MD;  Location: [facility] CATH LAB;  Service: Cardiovascular;  Laterality: N/A;  . Left subclavian Port- A-Cath insertion  03/09/2004   [place]ass[place] Done  . left upper lobectomy with node dissection  02/24/2010   [place]  . TONSILLECTOMY    . Transhiatal esophagectomy with cholecystectomy, jejunostomy,  pyloroplasty and removal of right subclavian Port-A- Cath     [place]        Home Medications    Prior to Admission medications   Medication Sig Start Date End Date Taking? Authorizing Provider  aspirin 81 MG tablet Take 1 tablet (81 mg total) by mouth daily. 12/04/13   Jerline Pain, MD  atorvastatin (LIPITOR) 40 MG tablet Take 1 tablet (40 mg total) by mouth daily at 6 PM. Please keep upcoming appt for future refills. Thanks 11/04/17   Jerline Pain, MD  carvedilol (COREG) 12.5 MG tablet TAKE 1 TABLET (12.5 MG TOTAL) BY MOUTH 2 (TWO) TIMES DAILY 01/31/18   Jerline Pain, MD  guaiFENesin (MUCINEX) 600 MG 12 hr tablet Take 1,200 mg by mouth 2 (two) times daily as needed for congestion.    [provider]  lisinopril (PRINIVIL,ZESTRIL) 5 MG tablet TAKE 1 TABLET BY MOUTH EVERY DAY 11/19/17   Jerline Pain, MD    Family History Family History  Problem Relation Age of Onset  . Aneurysm Mother   . Kidney disease Father   . Heart disease Maternal Uncle   . Heart disease Paternal Uncle   . Aneurysm Maternal Grandmother   . Diabetes Sister     Social History Social History   Tobacco Use  . Smoking status: Former Smoker    Types: Cigarettes    Last attempt to quit: 12/10/2002    Years since quitting: 15.2  . Smokeless tobacco: Never Used  Substance Use Topics  . Alcohol use: No  . Drug use: No     Allergies   Patient has no known  allergies.   Review of Systems Review of Systems  Constitutional: Negative for fever.  Respiratory: Positive for shortness of breath. Negative for cough and wheezing.   Cardiovascular: Negative for chest pain.  All other systems reviewed and are negative.    Physical Exam Updated Vital Signs BP 128/87 (BP Location: Right Arm)   Temp 97.6 F (36.4 C) (Oral)   Resp 20   SpO2 100%   Physical Exam  Constitutional: He is oriented to person, place, and time. He appears well-developed and well-nourished. No distress.  HENT:  Head: Normocephalic and atraumatic.  Mouth/Throat: Oropharynx is clear and moist.  Eyes: Pupils are equal, round, and reactive to light. Conjunctivae and EOM are normal.  Neck: Normal range of motion. Neck supple.  Cardiovascular: Normal rate, regular rhythm and normal heart sounds.  Pulmonary/Chest: Effort normal and breath sounds normal. No respiratory distress.  Diffuse bilateral expiratory wheezes  Abdominal: Soft. He exhibits no distension. There is no tenderness.  Musculoskeletal: Normal range of motion. He exhibits no edema or deformity.  Neurological: He is alert and oriented to person, place, and time.  Skin: Skin is warm and dry.  Psychiatric: He has a normal mood and affect.  Nursing note and vitals reviewed.    ED Treatments / Results  Labs (all labs ordered are listed, but only abnormal results are displayed) Labs Reviewed  COMPREHENSIVE METABOLIC PANEL - Abnormal; Notable for the following components:      Result Value   BUN 21 (*)    Creatinine, Ser 1.31 (*)    Albumin 3.3 (*)    ALT 13 (*)    GFR calc non Af Amer 52 (*)    GFR calc Af Amer 60 (*)    All other components within normal limits  CBC WITH DIFFERENTIAL/PLATELET - Abnormal; Notable for the following components:   RBC 4.09 (*)    Hemoglobin 12.7 (*)    All other components within normal limits  BRAIN NATRIURETIC PEPTIDE - Abnormal; Notable for the following components:    B Natriuretic Peptide 733.9 (*)    All other components within normal limits  I-STAT TROPONIN, ED    EKG EKG Interpretation  Date/Time:  Saturday March 08 2018 17:18:22 EDT Ventricular Rate:  74 PR Interval:    QRS Duration: 135 QT Interval:  368 QTC Calculation: 409 R Axis:   -86 Text Interpretation:  Sinus rhythm Prolonged PR interval Left bundle branch block Confirmed by Dene Gentry [phone]) on 03/08/2018 5:25:20 PM   Radiology Dg Chest Port 1 View  Result Date: 03/08/2018 CLINICAL DATA:  Shortness of breath. Right-sided lung cancer with surgery and radiation therapy. Pneumonia. Coronary artery disease. Aneurysm. EXAM: PORTABLE CHEST 1 VIEW COMPARISON:  Scout from the CT of 03/03/2018. FINDINGS: Midline trachea. Mild cardiomegaly. Prior median sternotomy. Small left pleural effusion is similar. Loculated right-sided pleural effusion is not significantly changed. Biapical pleuroparenchymal scarring. No pneumothorax. Basilar predominant interstitial thickening is moderate. No new pulmonary opacity. Scarring at the left lung base. IMPRESSION: Compared to the scout film of the CT of 03/03/2018, no gross interval change. Bilateral pleural effusions, including a loculated right-sided effusion. Chronic interstitial thickening suspicious for mild pulmonary venous congestion, superimposed upon emphysema. Electronically Signed   By: Abigail Miyamoto M.D.   On: 03/08/2018 18:05    Procedures Procedures (including critical care time)  Medications Ordered in ED Medications  ipratropium-albuterol (DUONEB) 0.5-2.5 (3) MG/3ML nebulizer solution 3 mL (3 mLs Nebulization Given 03/08/18 1734)  methylPREDNISolone sodium succinate (SOLU-MEDROL) 125 mg/2 mL injection 125 mg (125 mg Intravenous Given 03/08/18 1907)     Initial Impression / Assessment and Plan / ED Course  I have reviewed the triage vital signs and the nursing notes.  Pertinent labs & imaging results that were available during my care of  the patient were reviewed by me and considered in my medical decision making (see chart for details).     MDM  Screen complete   Patient is presenting with shortness of breath.  Screening EKG is without evidence of acute ischemia.  Troponin x1 is negative.  Other baseline labs are without significant findings.  Chest x-ray does not show an acute process.  Of note, patient was given a CT of the chest abdomen pelvis 5 days previously without evidence on that exam of thrombo embolism or infiltrate.  Patient with improvement following DuoNeb.  Patient will be  admitted for suspected bronchospasm and COPD exacerbation.    Final Clinical Impressions(s) / ED Diagnoses   Final diagnoses:  Shortness of breath    ED Discharge Orders    None       Valarie Merino, MD 03/08/18 [phone]

## 2018-03-08 NOTE — ED Notes (Signed)
Attempted to call report x 1  

## 2018-03-08 NOTE — H&P (Addendum)
History and Physical    Glen Green XTK:[id number] DOB: [birth year] DOA: 03/08/2018  PCP: Glen Green, Glen Sa, MD  Patient coming from: Home.  Chief Complaint: Shortness of breath.  HPI: Glen Green is a 77 y.o. male with history of ischemic cardiomyopathy, CAD status post CABG, hypertension, history of lung cancer and esophageal cancer status post resection presents to the ER because of worsening shortness of breath over the last 24-48 hours.  Patient states he was doing fine and was playing golf yesterday.  Following which he became more short of breath.  Shortness of breath increases on lying down flat.  Denies any chest pain or productive cough fever or chills.  Patient has a known history of abdominal aortic aneurysm being followed by vascular surgeon.  Patient on March 25 had CT angiogram of the chest and abdomen for follow-up with vascular surgeon.  At that time CAT scan shows loculated effusion with some mediastinal thickening concerning for possible recurrence of patient's cancer and also there was a concern for left kidney cancer.  ED Course: In the ER patient was found to be wheezing.  Chest x-ray shows bilateral pleural effusion with some loculation.  Also shows features concerning for congestion  And also emphysema.  BNP was elevated at 700.  Troponin was negative.  EKG was showing normal sinus rhythm with LBBB.  Patient was given nebulizer treatment along with IV steroids for possible bronchitis.  On my exam patient's wheezing is completely resolved.  Skin appears mildly short of breath.  JVD elevated.  Review of Systems: As per HPI, rest all negative.   Past Medical History:  Diagnosis Date  . AAA (abdominal aortic aneurysm) ([place]) 08/22/2012   3.9 cm by CT  - June 2013, stable   . 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 Atrial fibrillation ([place])   . CAD (coronary artery disease)   . Cancer ([place])    Esophageal, adrenal gland, skin; lung  . Carotid artery  occlusion   . Cerebral aneurysm    [place]. repair  1994  . Dyslipidemia   . History of esophageal cancer    s/p transhiatal esophagogastrectomy  . History of lung cancer   . Hypothyroidism   . Pneumonia   . Postoperative atrial fibrillation ([place]) 12/04/2013   Short course of amiodarone, resolved. Postop bypass  . SBO (small bowel obstruction) ([place]) 08/15/2012   History of small bowel obstruction (status post small bowel       resection, lysis of adhesions, incidental appendectomy, and repair       of left diaphragmatic hernia  . Stroke [facility]) 1995   denies residual    Past Surgical History:  Procedure Laterality Date  . BRAIN SURGERY     brain aneursyn  . CHOLECYSTECTOMY    . CORONARY ARTERY BYPASS GRAFT N/A 07/30/2013   Procedure: CORONARY ARTERY BYPASS GRAFTING (CABG) times two on pump using left internal mammary artery and left greater saphenous vein via endovein harvest.;  Surgeon: Gaye Pollack, MD;  Location: [facility] OR;  Service: Open Heart Surgery;  Laterality: N/A;  . Exploratory laparotomy, lysis of adhesions, reduce of incarcerated small bowel and colon from the chest, limited small bowel resection, incidental appendectomy, and then repair of diaphragmatic hernia with AlloDerm mesh  10/27/2009   [place]  . Exploratory laparotomy,exploratory thoracotomy for hemorrhage  02/11/2003   [place]  . LEFT HEART CATHETERIZATION WITH CORONARY ANGIOGRAM N/A 07/30/2013   Procedure: LEFT HEART CATHETERIZATION WITH CORONARY ANGIOGRAM;  Surgeon: Peter M Martinique, MD;  Location: [facility] CATH LAB;  Service: Cardiovascular;  Laterality: N/A;  . Left subclavian Port- A-Cath insertion  03/09/2004   [place]  . left upper lobectomy with node dissection  02/24/2010   [place]  . TONSILLECTOMY    . Transhiatal esophagectomy with cholecystectomy, jejunostomy,  pyloroplasty and removal of right subclavian Port-A- Cath     Glen Green     reports that he quit smoking about 15 years ago. His smoking use included cigarettes.  He has never used smokeless tobacco. He reports that he does not drink alcohol or use drugs.  No Known Allergies  Family History  Problem Relation Age of Onset  . Aneurysm Mother   . Kidney disease Father   . Heart disease Maternal Uncle   . Heart disease Paternal Uncle   . Aneurysm Maternal Grandmother   . Diabetes Sister     Prior to Admission medications   Medication Sig Start Date End Date Taking? Authorizing Provider  aspirin 81 MG tablet Take 1 tablet (81 mg total) by mouth daily. 12/04/13  Yes Jerline Pain, MD  atorvastatin (LIPITOR) 40 MG tablet Take 1 tablet (40 mg total) by mouth daily at 6 PM. Please keep upcoming appt for future refills. Thanks 11/04/17  Yes Jerline Pain, MD  carvedilol (COREG) 12.5 MG tablet TAKE 1 TABLET (12.5 MG TOTAL) BY MOUTH 2 (TWO) TIMES DAILY 01/31/18  Yes Jerline Pain, MD  diphenhydramine-acetaminophen (TYLENOL PM) 25-500 MG TABS tablet Take 2 tablets by mouth at bedtime as needed (for sleep better).   Yes [provider]  guaiFENesin (MUCINEX) 600 MG 12 hr tablet Take 1,200 mg by mouth 2 (two) times daily as needed for congestion.   Yes [provider]  lisinopril (PRINIVIL,ZESTRIL) 5 MG tablet TAKE 1 TABLET BY MOUTH EVERY DAY Patient taking differently: TAKE 1 TABLET (5 MG TOTALLY) BY MOUTH EVERY DAY 11/19/17  Yes Jerline Pain, MD    Physical Exam: Vitals:   03/08/18 1815 03/08/18 1830 03/08/18 1845 03/08/18 1900  BP: 125/83 122/77 128/78 124/78  Pulse: 72 70 73 72  Resp: 20 (!) 25 (!) 0 (!) 0  Temp:      TempSrc:      SpO2: 98% 98% 98% 97%      Constitutional: Moderately built and nourished. Vitals:   03/08/18 1815 03/08/18 1830 03/08/18 1845 03/08/18 1900  BP: 125/83 122/77 128/78 124/78  Pulse: 72 70 73 72  Resp: 20 (!) 25 (!) 0 (!) 0  Temp:      TempSrc:      SpO2: 98% 98% 98% 97%   Eyes: Anicteric no pallor. ENMT: No discharge from the ears eyes nose or mouth. Neck: JVD mildly elevated.  No mass  felt. Respiratory: No rhonchi or crepitations. Cardiovascular: S1-S2 heard. Abdomen: Soft nontender bowel sounds present. Musculoskeletal: No edema.  No joint effusion. Skin: No rash.  Skin appears warm. Neurologic: Alert awake oriented to time place and person.  Moves all extremities. Psychiatric: Appears normal.  Normal affect.    Labs on Admission: I have personally reviewed following labs and imaging studies  CBC: Recent Labs  Lab 03/08/18 1743  WBC 5.4  NEUTROABS 3.6  HGB 12.7*  HCT 39.8  MCV 97.3  PLT 676   Basic Metabolic Panel: Recent Labs  Lab 03/08/18 1743  NA 140  K 4.3  CL 106  CO2 26  GLUCOSE 92  BUN 21*  CREATININE 1.31*  CALCIUM 9.1  GFR: CrCl cannot be calculated (Unknown ideal weight.). Liver Function Tests: Recent Labs  Lab 03/08/18 1743  AST 23  ALT 13*  ALKPHOS 79  BILITOT 0.8  PROT 6.5  ALBUMIN 3.3*   No results for input(s): LIPASE, AMYLASE in the last 168 hours. No results for input(s): AMMONIA in the last 168 hours. Coagulation Profile: No results for input(s): INR, PROTIME in the last 168 hours. Cardiac Enzymes: No results for input(s): CKTOTAL, CKMB, CKMBINDEX, TROPONINI in the last 168 hours. BNP (last 3 results) No results for input(s): PROBNP in the last 8760 hours. HbA1C: No results for input(s): HGBA1C in the last 72 hours. CBG: No results for input(s): GLUCAP in the last 168 hours. Lipid Profile: No results for input(s): CHOL, HDL, LDLCALC, TRIG, CHOLHDL, LDLDIRECT in the last 72 hours. Thyroid Function Tests: No results for input(s): TSH, T4TOTAL, FREET4, T3FREE, THYROIDAB in the last 72 hours. Anemia Panel: No results for input(s): VITAMINB12, FOLATE, FERRITIN, TIBC, IRON, RETICCTPCT in the last 72 hours. Urine analysis:    Component Value Date/Time   COLORURINE AMBER (A) 07/30/2013 1422   APPEARANCEUR CLEAR 07/30/2013 1422   LABSPEC 1.028 07/30/2013 1422   PHURINE 5.0 07/30/2013 1422   GLUCOSEU NEGATIVE  07/30/2013 1422   GLUCOSEU NEGATIVE 08/22/2012 1020   HGBUR NEGATIVE 07/30/2013 1422   BILIRUBINUR SMALL (A) 07/30/2013 1422   KETONESUR NEGATIVE 07/30/2013 1422   PROTEINUR NEGATIVE 07/30/2013 1422   UROBILINOGEN 1.0 07/30/2013 1422   NITRITE NEGATIVE 07/30/2013 1422   LEUKOCYTESUR NEGATIVE 07/30/2013 1422   Sepsis Labs: @LABRCNTIP (procalcitonin:4,lacticidven:4) )No results found for this or any previous visit (from the past 240 hour(s)).   Radiological Exams on Admission: Dg Chest Port 1 View  Result Date: 03/08/2018 CLINICAL DATA:  Shortness of breath. Right-sided lung cancer with surgery and radiation therapy. Pneumonia. Coronary artery disease. Aneurysm. EXAM: PORTABLE CHEST 1 VIEW COMPARISON:  Scout from the CT of 03/03/2018. FINDINGS: Midline trachea. Mild cardiomegaly. Prior median sternotomy. Small left pleural effusion is similar. Loculated right-sided pleural effusion is not significantly changed. Biapical pleuroparenchymal scarring. No pneumothorax. Basilar predominant interstitial thickening is moderate. No new pulmonary opacity. Scarring at the left lung base. IMPRESSION: Compared to the scout film of the CT of 03/03/2018, no gross interval change. Bilateral pleural effusions, including a loculated right-sided effusion. Chronic interstitial thickening suspicious for mild pulmonary venous congestion, superimposed upon emphysema. Electronically Signed   By: Abigail Miyamoto M.D.   On: 03/08/2018 18:05    EKG: Independently reviewed.  Normal sinus rhythm with LBBB.  Assessment/Plan Principal Problem:   Acute respiratory failure with hypoxia (HCC) Active Problems:   History of esophageal cancer   Hypothyroidism   History of lung cancer   PVD (peripheral vascular disease) (HCC)   Postoperative atrial fibrillation (HCC)   Cardiomyopathy, ischemic   Aneurysm of abdominal vessel (HCC)   Chronic systolic heart failure ([place])    1. Acute respiratory failure with hypoxia -suspect  most likely from possible CHF given elevated BNP and bilateral pleural effusion and chest x-ray showing congestion.  I would order 1 dose of Lasix.  Will check d-dimer and if elevated get VQ scan.  Follow intake output metabolic panel and check 2D echo.  Patient is on lisinopril.  If creatinine does not improve may have to hold lisinopril.  Last 2D echo showed EF of 30-35% in July 2015.  Since patient was initially wheezing in the ER and was given steroids and nebulizer I have continued patient on nebulizer and Pulmicort for now.  2. Recent CAT scan showing bilateral pleural effusion with loculation with mediastinal thickening and also left kidney enhancement all of which are concerning for recurrence of patient's cancer.  Patient may need thoracentesis.  Heart discussed with patient's cardiac thoracic surgeon Dr. Servando Snare in the morning.  Patient has had previous history of lung cancer and esophageal cancer for which patient had resection. 3. History of CAD status post CABG denies any chest pain on aspirin statins. 4. History of ischemic cardiomyopathy on lisinopril.  See #1. 5. Acute renal failure -creatinine is mildly elevated.  If creatinine does not improve may have to hold lisinopril. 6. History of postoperative atrial fibrillation presently in sinus rhythm. 7. Chronic LBBB. 8. Normocytic normochromic anemia -follow CBC. 9. Abdominal aortic aneurysm and ascending aortic aneurysm being followed by vascular surgeon.   DVT prophylaxis: Patient was started on Lovenox.  May have to hold it if thoracentesis is needed. Code Status: Full code. Family Communication: Patient's wife and daughter. Disposition Plan: Home. Consults called: None. Admission status: Inpatient.   Rise Patience MD [facility] Pager [phone].  If 7PM-7AM, please contact night-coverage [email or web address] Password [facility]  03/08/2018, 8:23 PM

## 2018-03-09 ENCOUNTER — Inpatient Hospital Stay (HOSPITAL_COMMUNITY): Payer: Medicare Other

## 2018-03-09 ENCOUNTER — Encounter (HOSPITAL_COMMUNITY): Payer: Self-pay

## 2018-03-09 DIAGNOSIS — J439 Emphysema, unspecified: Secondary | ICD-10-CM

## 2018-03-09 DIAGNOSIS — I5023 Acute on chronic systolic (congestive) heart failure: Secondary | ICD-10-CM

## 2018-03-09 LAB — BASIC METABOLIC PANEL
Anion gap: 12 (ref 5–15)
BUN: 22 mg/dL — ABNORMAL HIGH (ref 6–20)
CALCIUM: 8.8 mg/dL — AB (ref 8.9–10.3)
CO2: 23 mmol/L (ref 22–32)
CREATININE: 1.24 mg/dL (ref 0.61–1.24)
Chloride: 103 mmol/L (ref 101–111)
GFR calc non Af Amer: 55 mL/min — ABNORMAL LOW (ref >60)
Glucose, Bld: 229 mg/dL — ABNORMAL HIGH (ref 65–99)
Potassium: 4.1 mmol/L (ref 3.5–5.1)
SODIUM: 138 mmol/L (ref 135–145)

## 2018-03-09 LAB — D-DIMER, QUANTITATIVE (NOT AT ARMC): D DIMER QUANT: 2.95 ug{FEU}/mL — AB (ref 0.00–0.50)

## 2018-03-09 LAB — TROPONIN I

## 2018-03-09 MED ORDER — PRO-STAT SUGAR FREE PO LIQD
30.0000 mL | Freq: Two times a day (BID) | ORAL | Status: DC
  Administered 2018-03-09 – 2018-03-10 (×4): 30 mL via ORAL
  Filled 2018-03-09 (×5): qty 30

## 2018-03-09 MED ORDER — FUROSEMIDE 10 MG/ML IJ SOLN
20.0000 mg | Freq: Once | INTRAMUSCULAR | Status: AC
  Administered 2018-03-09: 20 mg via INTRAVENOUS
  Filled 2018-03-09: qty 2

## 2018-03-09 MED ORDER — FUROSEMIDE 10 MG/ML IJ SOLN: 20.0000 mg | Freq: Every day | INTRAMUSCULAR | Status: DC

## 2018-03-09 MED ORDER — IPRATROPIUM-ALBUTEROL 0.5-2.5 (3) MG/3ML IN SOLN
3.0000 mL | RESPIRATORY_TRACT | Status: DC | PRN
  Administered 2018-03-10: 3 mL via RESPIRATORY_TRACT
  Filled 2018-03-09: qty 3

## 2018-03-09 MED ORDER — TIOTROPIUM BROMIDE MONOHYDRATE 18 MCG IN CAPS
18.0000 ug | ORAL_CAPSULE | Freq: Every day | RESPIRATORY_TRACT | Status: DC
  Administered 2018-03-11: 18 ug via RESPIRATORY_TRACT
  Filled 2018-03-09 (×2): qty 5

## 2018-03-09 MED ORDER — TECHNETIUM TC 99M DIETHYLENETRIAME-PENTAACETIC ACID
32.5000 | Freq: Once | INTRAVENOUS | Status: AC | PRN
  Administered 2018-03-09: 32.5 via RESPIRATORY_TRACT

## 2018-03-09 MED ORDER — FUROSEMIDE 10 MG/ML IJ SOLN
20.0000 mg | Freq: Every day | INTRAMUSCULAR | Status: AC
  Administered 2018-03-10 – 2018-03-11 (×2): 20 mg via INTRAVENOUS
  Filled 2018-03-09 (×2): qty 2

## 2018-03-09 MED ORDER — IPRATROPIUM-ALBUTEROL 0.5-2.5 (3) MG/3ML IN SOLN: 3.0000 mL | Freq: Three times a day (TID) | RESPIRATORY_TRACT | Status: DC

## 2018-03-09 MED ORDER — TECHNETIUM TO 99M ALBUMIN AGGREGATED
4.1200 | Freq: Once | INTRAVENOUS | Status: AC | PRN
  Administered 2018-03-09: 4.12 via INTRAVENOUS

## 2018-03-09 NOTE — Consult Note (Signed)
Name: Glen Green MRN: [id number] DOB: 07-10-1941    ADMISSION DATE:  03/08/2018 CONSULTATION DATE:  03/09/18  REFERRING MD :  [facility], Dr. Lacinda Axon  CHIEF COMPLAINT:  Loculated pleural effusion   BRIEF PATIENT DESCRIPTION:  77 yo male with CM , CAD, AAA  with previous history of lung cancer and esophageal cancer s/p resection admitted 3/30 with dyspnea /hypoxia found to have bilateral effusions, elevated BNP along with loculated right pleural effusion . Pulmonary consulted for loculated effusion   SIGNIFICANT EVENTS  Admit to hospital 3/30   STUDIES:  CT chest 03/03/18 >emphysema , loculated right pleural effusion , soft tissue thickening along the right side of the mediastinum , enhancing kidney lesion .    HISTORY OF PRESENT ILLNESS:   77 year old male former smoker with known history of ischemic cardiomyopathy, coronary artery disease status post CABG (2014) , hypertension admitted on March 08, 2018 with dyspnea and hypoxia.  Patient has a history of lung cancer     status post LUL resection.   Previous esophageal cancer s/p resection/chemo Patient presented to the ER on March 30 with progressive shortness of breath and orthopnea for 4-5 days. Was able to play golf day before admission . Marland Kitchen  Patient has a known abdominal aortic aneurysm being followed by vascular surgery.  He had a CT chest and abdomen on March 25 that showed a loculated effusion on the right with some mediastinal thickening with possible concern for recurrence of patient's cancer.  There was also a left kidney lesion.  In the emergency room patient was found to have on chest x-ray and bilateral pleural effusion with right sided loculated effusion.  His BNP was elevated around 700.  Troponin was negative.  EKG showed a normal sinus rhythm.  He was treated for possible bronchitis with IV steroids.  Previous echo in 2015 showed an EF of 30-35%.  Echo is pending.  D-dimer was elevated.  Venous Doppler pending  and VQ scan low prob for  PE .  Patient had elevated serum creatinine.  Patient was given Lasix 40 mg.  Since admission patient is feeling some better.  Pulmonary was consulted to evaluate for his loculated effusion on the right.Marland Kitchen    PAST MEDICAL HISTORY :   has a past medical history of AAA (abdominal aortic aneurysm) ([place]) (08/22/2012), Adrenal tumor (08/15/2012), Atrial fibrillation ([place]), CAD (coronary artery disease), Cancer ([place]), Carotid artery occlusion, Cerebral aneurysm, Dyslipidemia, History of esophageal cancer, History of lung cancer, Hypothyroidism, Pneumonia, Postoperative atrial fibrillation ([place]) (12/04/2013), SBO (small bowel obstruction) ([place]) (08/15/2012), and Stroke ([place]) (1995).  has a past surgical history that includes left upper lobectomy with node dissection (02/24/2010); Exploratory laparotomy, lysis of adhesions, reduce of incarcerated small bowel and colon from the chest, limited small bowel resection, incidental appendectomy, and then repair of diaphragmatic hernia with AlloDerm mesh (10/27/2009); Left subclavian Port- A-Cath insertion (03/09/2004); Exploratory laparotomy,exploratory thoracotomy for hemorrhage (02/11/2003); Transhiatal esophagectomy with cholecystectomy, jejunostomy,  pyloroplasty and removal of right subclavian Port-A- Cath; Brain surgery; Tonsillectomy; Cholecystectomy; Coronary artery bypass graft (N/A, 07/30/2013); and left heart catheterization with coronary angiogram (N/A, 07/30/2013). Prior to Admission medications   Medication Sig Start Date End Date Taking? Authorizing Provider  aspirin 81 MG tablet Take 1 tablet (81 mg total) by mouth daily. 12/04/13  Yes Jerline Pain, MD  atorvastatin (LIPITOR) 40 MG tablet Take 1 tablet (40 mg total) by mouth daily at 6 PM. Please keep upcoming appt for future refills. Thanks 11/04/17  Yes Skains,  Thana Farr, MD  carvedilol (COREG) 12.5 MG tablet TAKE 1 TABLET (12.5 MG TOTAL) BY MOUTH 2 (TWO) TIMES DAILY 01/31/18  Yes Jerline Pain, MD    diphenhydramine-acetaminophen (TYLENOL PM) 25-500 MG TABS tablet Take 2 tablets by mouth at bedtime as needed (for sleep better).   Yes [provider]  guaiFENesin (MUCINEX) 600 MG 12 hr tablet Take 1,200 mg by mouth 2 (two) times daily as needed for congestion.   Yes [provider]  lisinopril (PRINIVIL,ZESTRIL) 5 MG tablet TAKE 1 TABLET BY MOUTH EVERY DAY Patient taking differently: TAKE 1 TABLET (5 MG TOTALLY) BY MOUTH EVERY DAY 11/19/17  Yes Jerline Pain, MD   No Known Allergies  FAMILY HISTORY:  family history includes Aneurysm in his maternal grandmother and mother; Diabetes in his sister; Heart disease in his maternal uncle and paternal uncle; Kidney disease in his father. SOCIAL HISTORY:  reports that he quit smoking about 15 years ago. His smoking use included cigarettes. He has never used smokeless tobacco. He reports that he does not drink alcohol or use drugs.  REVIEW OF SYSTEMS:   Constitutional:   No  weight loss, night sweats,  Fevers, chills, fatigue, or  lassitude.  HEENT:   No headaches,  Difficulty swallowing,  Tooth/dental problems, or  Sore throat,                No sneezing, itching, ear ache, nasal congestion, post nasal drip,   CV:  No chest pain,  +Orthopnea, +PND, +swelling in lower extremities,  No anasarca, dizziness, palpitations, syncope.   GI  No heartburn, indigestion, abdominal pain, nausea, vomiting, diarrhea, change in bowel habits, loss of appetite, bloody stools.   Resp: + shortness of breath with exertion or at rest.  No excess mucus, no productive cough,  No non-productive cough,  No coughing up of blood.  No change in color of mucus.  No wheezing.  No chest wall deformity  Skin: no rash or lesions.  GU: no dysuria, change in color of urine, no urgency or frequency.  No flank pain, no hematuria   MS:  No joint pain or swelling.  No decreased range of motion.  No back pain.  Psych:  No change in mood or affect. No depression  or anxiety.  No memory loss.     SUBJECTIVE: Breathing feeling some better   VITAL SIGNS: Temp:  [97.6 F (36.4 C)-97.9 F (36.6 C)] 97.9 F (36.6 C) (03/31 0745) Pulse Rate:  [66-80] 73 (03/31 0745) Resp:  [0-27] 24 (03/30 2136) BP: (115-145)/(77-88) 117/79 (03/31 0745) SpO2:  [95 %-100 %] 98 % (03/31 1130) Weight:  [147 lb 11.3 oz (67 kg)-148 lb 5.9 oz (67.3 kg)] 147 lb 11.3 oz (67 kg) (03/31 0500)  PHYSICAL EXAMINATION: GEN: A/Ox3; pleasant , NAD, thin and elderly male    HEENT:  [place]/AT,   THROAT-clear, no lesions, no postnasal drip or exudate noted.   NECK:  Supple w/ fair ROM; no JVD; normal carotid impulses w/o bruits; no thyromegaly or nodules palpated; no lymphadenopathy.    RESP  Clear  P & A; w/o, wheezes/ rales/ or rhonchi. no accessory muscle use, no dullness to percussion  CARD:  RRR, no m/r/g  , no peripheral edema, pulses intact, no cyanosis or clubbing.  GI:   Soft & nt; nml bowel sounds; no organomegaly or masses detected.   Musco: Warm bil, no deformities or joint swelling noted.   Neuro: alert, no focal deficits noted.  Skin: Warm, no lesions or rashes    Recent Labs  Lab 03/08/18 1743 03/08/18 2321  NA 140 138  K 4.3 4.1  CL 106 103  CO2 26 23  BUN 21* 22*  CREATININE 1.31* 1.24  GLUCOSE 92 229*   Recent Labs  Lab 03/08/18 1743 03/08/18 2321  HGB 12.7* 12.4*  HCT 39.8 38.5*  WBC 5.4 7.2  PLT 199 192   Nm Pulmonary Perf And Vent  Result Date: 03/09/2018 CLINICAL DATA:  Evaluate for pulmonary embolus. EXAM: NUCLEAR MEDICINE VENTILATION - PERFUSION LUNG SCAN TECHNIQUE: Ventilation images were obtained in multiple projections using inhaled aerosol Tc-46m DTPA. Perfusion images were obtained in multiple projections after intravenous injection of Tc-107m-MAA. RADIOPHARMACEUTICALS:  32.5 mCi of Tc-82m DTPA aerosol inhalation and 4.12 mCi Tc64m-MAA IV COMPARISON:  Chest radiograph 03/08/2018 FINDINGS: Ventilation: There is a large defect  within the right midlung which corresponds to the right loculated effusion in the right midlung. Peripheral nonsegmental decreased ventilation to the right upper lobe is identified. This corresponds with an area of peripheral pleural thickening or loculated pleural fluid on chest radiograph. Perfusion: There is a perfusion defect within the right midlung corresponding to the ventilation abnormality. No wedge-shaped/segmental perfusion defects identified to suggest acute pulmonary embolus. Nonsegmental decreased perfusion to the periphery of the right upper lobe corresponds to the ventilation and chest radiograph abnormality. IMPRESSION: No suspicious segmental perfusion defects identified to suggest acute pulmonary embolus. Electronically Signed   By: Kerby Moors M.D.   On: 03/09/2018 11:19   Dg Chest Port 1 View  Result Date: 03/08/2018 CLINICAL DATA:  Shortness of breath. Right-sided lung cancer with surgery and radiation therapy. Pneumonia. Coronary artery disease. Aneurysm. EXAM: PORTABLE CHEST 1 VIEW COMPARISON:  Scout from the CT of 03/03/2018. FINDINGS: Midline trachea. Mild cardiomegaly. Prior median sternotomy. Small left pleural effusion is similar. Loculated right-sided pleural effusion is not significantly changed. Biapical pleuroparenchymal scarring. No pneumothorax. Basilar predominant interstitial thickening is moderate. No new pulmonary opacity. Scarring at the left lung base. IMPRESSION: Compared to the scout film of the CT of 03/03/2018, no gross interval change. Bilateral pleural effusions, including a loculated right-sided effusion. Chronic interstitial thickening suspicious for mild pulmonary venous congestion, superimposed upon emphysema. Electronically Signed   By: Abigail Miyamoto M.D.   On: 03/08/2018 18:05    ASSESSMENT / PLAN:  1. Loculated Right Pleural Effusion along the right major fissure. Has previous hx of Lung cancer and Esophageal cancer w/ prev resection . Does not appear  infectious as no fever or leukocytosis   Plan  Continue to diuresis , may improve .  Watch with serial chest xray .    2. Bilateral pleural Effusions with underlying CHF -elevated BNP VQ scan neg .    Plan  Diuresis as scr /B/p allow  Echo pending    3. Emphysema on CT chest -not on meds at home   Plan  Cont scheduled nebs.       Rexene Edison NP-C  [facility] and [place] Pager: [phone]  03/09/2018, 2:24 PM

## 2018-03-09 NOTE — Progress Notes (Signed)
Patient ID: Glen Green, male   DOB: 1941-02-25, 77 y.o.   MRN: [id number]                                                                PROGRESS NOTE                                                                                                                                                                                                             Patient Demographics:    Glen Green, is a 77 y.o. male, DOB - [birth year], RCV:[id number]  Admit date - 03/08/2018   Admitting Physician Rise Patience, MD  Outpatient Primary MD for the patient is Alroy Dust, L.Marlou Sa, MD  LOS - 1  [facility]     Chief Complaint  Patient presents with  . Shortness of Breath       Brief Narrative    77 y.o. male with history of ischemic cardiomyopathy, CAD status post CABG, hypertension, history of lung cancer and esophageal cancer status post resection presents to the ER because of worsening shortness of breath over the last 24-48 hours.  Patient states he was doing fine and was playing golf yesterday.  Following which he became more short of breath.  Shortness of breath increases on lying down flat.  Denies any chest pain or productive cough fever or chills.  Patient has a known history of abdominal aortic aneurysm being followed by vascular surgeon.  Patient on March 25 had CT angiogram of the chest and abdomen for follow-up with vascular surgeon.  At that time CAT scan shows loculated effusion with some mediastinal thickening concerning for possible recurrence of patient's cancer and also there was a concern for left kidney cancer.  ED Course: In the ER patient was found to be wheezing.  Chest x-ray shows bilateral pleural effusion with some loculation.  Also shows features concerning for congestion  And also emphysema.  BNP was elevated at 700.  Troponin was negative.  EKG was showing normal sinus rhythm with LBBB.  Patient was given nebulizer treatment along with IV steroids for possible  bronchitis.  On my exam patient's wheezing is completely resolved.  Skin appears mildly short of breath.  JVD elevated.      Subjective:    Glen Green today states breathing slightly  better. Pt states has had breathing difficulty since Tuesday.  Pt denies fever, chills, cough, cp, palp, orthopnea, wt gain, lower ext edema.  + d dimer in ED, VQ pending, LE ultrasound pending r/o DVT  No headache, No chest pain, No abdominal pain - No Nausea, No new weakness tingling or numbness,   Assessment  & Plan :    Principal Problem:   Acute respiratory failure with hypoxia (HCC) Active Problems:   History of esophageal cancer   Hypothyroidism   History of lung cancer   PVD (peripheral vascular disease) (HCC)   Postoperative atrial fibrillation (HCC)   Cardiomyopathy, ischemic   Aneurysm of abdominal vessel (HCC)   Chronic systolic heart failure (HCC)  Acute Respiratory Failure with hypoxia -suspect CHF given elevated BNP and bilateral pleural effusion -EF 30-35% 06/2014 Cardiac echo pending Lasix 20mg  iv qday x 2 doses ordered  + D dimer VQ scan pending, LE ultrasound pending  Loculated pleural effusion Pulmonary consulted, appreciate input  Abnormal enhancement Left Kidney MRI abdomen with and without contrast  CAD s/p CABG, ischemic cardiomyopathy Cont Aspirin 81mg  po qday Cont Carvedilol 12.mg po bid Cont Lisinopril 5mg  po qday Consider switching to [employer] Lipitor 40mg  po qhs Cont duoneb  Post-operative Afib Currently in NSR  ARF Check cmp in am  Anemia Check cbc in am  AAA Outpatient follow up with vascular surgery  Protein calorie malnutrition (moderate) Start prostat  ? Claudication Pt states had ABI scheduled for outpatient tomorrow Will order ABI  Code Status :   FULL CODE  Family Communication  : w patient  Disposition Plan  : home  Barriers For Discharge :   Consults  :  Pulmonary consult called  Procedures  :   DVT Prophylaxis  :   Lovenox -- SCDs   Lab Results  Component Value Date   PLT 192 03/08/2018    Antibiotics  :    Anti-infectives (From admission, onward)   None        Objective:   Vitals:   03/09/18 0320 03/09/18 0500 03/09/18 0719 03/09/18 0745  BP:    117/79  Pulse:    73  Resp:      Temp:    97.9 F (36.6 C)  TempSrc:    Oral  SpO2: 98%  98% 97%  Weight:  67 kg (147 lb 11.3 oz)    Height:        Wt Readings from Last 3 Encounters:  03/09/18 67 kg (147 lb 11.3 oz)  01/03/18 65 kg (143 lb 6.4 oz)  09/09/17 64.9 kg (143 lb)     Intake/Output Summary (Last 24 hours) at 03/09/2018 0951 Last data filed at 03/09/2018 0800 Gross per 24 hour  Intake 240 ml  Output -  Net 240 ml     Physical Exam  Awake Alert, Oriented X 3, No new F.N deficits, Normal affect [place].AT,PERRAL Supple Neck,No JVD, No cervical lymphadenopathy appriciated.  Symmetrical Chest wall movement, Good air movement bilaterally, decrease in BS in right lung base, no wheezing  RRR,No Gallops,Rubs or new Murmurs, No Parasternal Heave +ve B.Sounds, Abd Soft, No tenderness, No organomegaly appriciated, No rebound - guarding or rigidity. No Cyanosis, Clubbing or edema, No new Rash or bruise      Data Review:    CBC Recent Labs  Lab 03/08/18 1743 03/08/18 2321  WBC 5.4 7.2  HGB 12.7* 12.4*  HCT 39.8 38.5*  PLT 199 192  MCV 97.3 96.3  MCH 31.1  31.0  MCHC 31.9 32.2  RDW 15.4 15.2  LYMPHSABS 0.8  --   MONOABS 0.8  --   EOSABS 0.2  --   BASOSABS 0.0  --     Chemistries  Recent Labs  Lab 03/08/18 1743 03/08/18 2321  NA 140 138  K 4.3 4.1  CL 106 103  CO2 26 23  GLUCOSE 92 229*  BUN 21* 22*  CREATININE 1.31* 1.24  CALCIUM 9.1 8.8*  AST 23  --   ALT 13*  --   ALKPHOS 79  --   BILITOT 0.8  --    ------------------------------------------------------------------------------------------------------------------ No results for input(s): CHOL, HDL, LDLCALC, TRIG, CHOLHDL, LDLDIRECT in the last 72  hours.  Lab Results  Component Value Date   HGBA1C 6.5 (H) 07/30/2013   ------------------------------------------------------------------------------------------------------------------ No results for input(s): TSH, T4TOTAL, T3FREE, THYROIDAB in the last 72 hours.  Invalid input(s): FREET3 ------------------------------------------------------------------------------------------------------------------ No results for input(s): VITAMINB12, FOLATE, FERRITIN, TIBC, IRON, RETICCTPCT in the last 72 hours.  Coagulation profile No results for input(s): INR, PROTIME in the last 168 hours.  Recent Labs    03/08/18 2321  DDIMER 2.95*    Cardiac Enzymes Recent Labs  Lab 03/08/18 2321  TROPONINI <0.03   ------------------------------------------------------------------------------------------------------------------    Component Value Date/Time   BNP 733.9 (H) 03/08/2018 1743    Inpatient Medications  Scheduled Meds: . aspirin EC  81 mg Oral Daily  . atorvastatin  40 mg Oral q1800  . budesonide (PULMICORT) nebulizer solution  0.25 mg Nebulization BID  . carvedilol  12.5 mg Oral BID WC  . enoxaparin (LOVENOX) injection  40 mg Subcutaneous Q24H  . furosemide  20 mg Intravenous Once  . ipratropium-albuterol  3 mL Nebulization Q4H  . lisinopril  5 mg Oral Daily   Continuous Infusions: PRN Meds:.acetaminophen **OR** acetaminophen, albuterol, guaiFENesin, ondansetron **OR** ondansetron (ZOFRAN) IV  Micro Results No results found for this or any previous visit (from the past 240 hour(s)).  Radiology Reports Dg Chest Port 1 View  Result Date: 03/08/2018 CLINICAL DATA:  Shortness of breath. Right-sided lung cancer with surgery and radiation therapy. Pneumonia. Coronary artery disease. Aneurysm. EXAM: PORTABLE CHEST 1 VIEW COMPARISON:  Scout from the CT of 03/03/2018. FINDINGS: Midline trachea. Mild cardiomegaly. Prior median sternotomy. Small left pleural effusion is similar.  Loculated right-sided pleural effusion is not significantly changed. Biapical pleuroparenchymal scarring. No pneumothorax. Basilar predominant interstitial thickening is moderate. No new pulmonary opacity. Scarring at the left lung base. IMPRESSION: Compared to the scout film of the CT of 03/03/2018, no gross interval change. Bilateral pleural effusions, including a loculated right-sided effusion. Chronic interstitial thickening suspicious for mild pulmonary venous congestion, superimposed upon emphysema. Electronically Signed   By: Abigail Miyamoto M.D.   On: 03/08/2018 18:05   Ct Angio Chest Aorta W &/or Wo Contrast  Result Date: 03/04/2018 CLINICAL DATA:  Preop planning for abdominal aortic aneurysm EXAM: CT ANGIOGRAPHY CHEST, ABDOMEN AND PELVIS TECHNIQUE: Multidetector CT imaging through the chest, abdomen and pelvis was performed using the standard protocol during bolus administration of intravenous contrast. Multiplanar reconstructed images and MIPs were obtained and reviewed to evaluate the vascular anatomy. CONTRAST:  47mL ISOVUE-370 IOPAMIDOL (ISOVUE-370) INJECTION 76% Creatinine was obtained on site at [place] at [address] Results: Creatinine 1.1 mg/dL. COMPARISON:  Multiple prior studies including chest CTs, abdomen CTs, abdomen pelvis CTs, and PET CTs. FINDINGS: CTA CHEST FINDINGS Cardiovascular: Maximal diameter of the ascending aorta is 4.0 cm which is unchanged based on my direct  measurements on the prior study. There is no evidence of intramural hematoma or aortic dissection. Great vessels are patent. Right vertebral artery is patent. There is at least a moderate degree of narrowing at the origin of the left vertebral artery. There is smooth and calcified plaque at its origin. Left subclavian artery is patent. Atherosclerotic calcifications are present at the aortic arch extending into the origin of the left subclavian artery. There is some calcified and smooth plaque in the  left vertebral artery in the neck. There is no obvious acute pulmonary thromboembolism. 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 Moderate left main, circumflex, and LAD territory coronary artery calcification. Saphenous vein bypass grafts are present with CABG and sternotomy. Atherosclerotic changes of the descending thoracic aorta are characterized by calcification. No luminal narrowing. Mediastinum/Nodes: Status post esophagectomy and gastric pull-through has a stable appearance. There is stranding within the mediastinum consistent with postoperative changes. Soft tissue density adjacent to the SVC, along the right side of the anterior mediastinum has developed since the prior study. See image 75 of series 4. This may represent a pleural abnormality or loculated complex pleural fluid. Thyroid is unremarkable. Lungs/Pleura: Severe emphysema. A loculated right pleural effusion is present with fluid in the right major fissure as well as surrounding the right lung. Heterogeneous opacities at the base of the right lower and middle lobes are nonspecific. No pneumothorax. Musculoskeletal: Stable appearance of the thoracic spine with a mid-level compression deformity. Chronic right rib deformities. Review of the MIP images confirms the above findings. CTA ABDOMEN AND PELVIS FINDINGS VASCULAR Aorta: An infrarenal abdominal aortic aneurysm is present. Maximal AP and transverse diameters are 5.0 and 4.9 cm compared with 3.5 cm in 2011. Chronic mural thrombus in the aneurysmal segment is noted. There is mixing artifact noted in the abdominal aorta. Celiac: Moderate narrowing secondary to atherosclerosis and median arcuate ligament syndrome. Left gastric artery has likely been ligated. The lesser curvature is reperfused through the right gastric artery. SMA: Patent. Atherosclerotic calcification at the origin. Branch vessels grossly patent. Renals: Single renal arteries are widely patent with scattered  atherosclerotic calcification. There are no obvious accessory renal arteries. IMA: Origin is occluded.  Branch vessels reconstitute. Inflow: Allowing for mixing artifact, bilateral common, internal, and external iliac arteries are patent. Scattered atherosclerotic calcifications are noted. Review of the MIP images confirms the above findings. NON-VASCULAR Hepatobiliary: Postcholecystectomy.  Unremarkable liver. Pancreas: Atrophic. Spleen: Unremarkable Adrenals/Urinary Tract: Left adrenal gland is within normal limits. There are surgical staples in the right adrenal gland bed suggesting right adrenalectomy. Multiple low-density lesions throughout both kidneys are nonspecific. Most are likely simple cysts. There is a 1.0 cm enhancing lesion in the mid left kidney. See image 17 of series 20. Bladder is unremarkable. Stomach/Bowel: Status post esophagectomy with gastric pull-through is noted. There is no evidence of small-bowel obstruction. Colon is decompressed and without obvious mass. Lymphatic: No abnormal retroperitoneal adenopathy. Reproductive: Normal prostate. Other: No free fluid. Musculoskeletal: Stable lumbar spine. Osteopenia. No vertebral compression. Review of the MIP images confirms the above findings. IMPRESSION: Vascular: Maximal diameter of the ascending aorta is 4.0 cm. Recommend annual imaging followup by CTA or MRA. This recommendation follows 2010 ACCF/AHA/AATS/ACR/ASA/SCA/SCAI/SIR/STS/SVM Guidelines for the Diagnosis and Management of Patients with Thoracic Aortic Disease. Circulation. 2010; 121: J811-B147 There is at least a moderate degree of narrowing at the origin of the left vertebral artery. Infrarenal abdominal aortic aneurysm is present. Maximal diameter is 5.0 cm. Chronic occlusion at the origin of the  IMA. Branches reconstitute. This is advantageous for stent graft placement. 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 Nonvascular: Postop changes from gastric  pull-through and esophagectomy are noted. There is new soft tissue thickening along the right side of the mediastinum. Recurrence is not excluded. PET-CT is warranted. Loculated right pleural effusion. Malignant pleural effusion is not excluded. Enhancing lesion in the mid left kidney. Renal cell carcinoma is not excluded. MRI with and without contrast is recommended when feasible. Electronically Signed   By: Marybelle Killings M.D.   On: 03/04/2018 08:39   Ct Angio Abdomen Pelvis  W &/or Wo Contrast  Result Date: 03/04/2018 CLINICAL DATA:  Preop planning for abdominal aortic aneurysm EXAM: CT ANGIOGRAPHY CHEST, ABDOMEN AND PELVIS TECHNIQUE: Multidetector CT imaging through the chest, abdomen and pelvis was performed using the standard protocol during bolus administration of intravenous contrast. Multiplanar reconstructed images and MIPs were obtained and reviewed to evaluate the vascular anatomy. CONTRAST:  79mL ISOVUE-370 IOPAMIDOL (ISOVUE-370) INJECTION 76% Creatinine was obtained on site at [place] at [address] Results: Creatinine 1.1 mg/dL. COMPARISON:  Multiple prior studies including chest CTs, abdomen CTs, abdomen pelvis CTs, and PET CTs. FINDINGS: CTA CHEST FINDINGS Cardiovascular: Maximal diameter of the ascending aorta is 4.0 cm which is unchanged based on my direct measurements on the prior study. There is no evidence of intramural hematoma or aortic dissection. Great vessels are patent. Right vertebral artery is patent. There is at least a moderate degree of narrowing at the origin of the left vertebral artery. There is smooth and calcified plaque at its origin. Left subclavian artery is patent. Atherosclerotic calcifications are present at the aortic arch extending into the origin of the left subclavian artery. There is some calcified and smooth plaque in the left vertebral artery in the neck. There is no obvious acute pulmonary thromboembolism. Chronic occlusion of the left  innominate vein is suspected. Moderate left main, circumflex, and LAD territory coronary artery calcification. Saphenous vein bypass grafts are present with CABG and sternotomy. Atherosclerotic changes of the descending thoracic aorta are characterized by calcification. No luminal narrowing. Mediastinum/Nodes: Status post esophagectomy and gastric pull-through has a stable appearance. There is stranding within the mediastinum consistent with postoperative changes. Soft tissue density adjacent to the SVC, along the right side of the anterior mediastinum has developed since the prior study. See image 75 of series 4. This may represent a pleural abnormality or loculated complex pleural fluid. Thyroid is unremarkable. Lungs/Pleura: Severe emphysema. A loculated right pleural effusion is present with fluid in the right major fissure as well as surrounding the right lung. Heterogeneous opacities at the base of the right lower and middle lobes are nonspecific. No pneumothorax. Musculoskeletal: Stable appearance of the thoracic spine with a mid-level compression deformity. Chronic right rib deformities. Review of the MIP images confirms the above findings. CTA ABDOMEN AND PELVIS FINDINGS VASCULAR Aorta: An infrarenal abdominal aortic aneurysm is present. Maximal AP and transverse diameters are 5.0 and 4.9 cm compared with 3.5 cm in 2011. Chronic mural thrombus in the aneurysmal segment is noted. There is mixing artifact noted in the abdominal aorta. Celiac: Moderate narrowing secondary to atherosclerosis and median arcuate ligament syndrome. Left gastric artery has likely been ligated. The lesser curvature is reperfused through the right gastric artery. SMA: Patent. Atherosclerotic calcification at the origin. Branch vessels grossly patent. Renals: Single renal arteries are widely patent with scattered atherosclerotic calcification. There are no obvious accessory renal arteries. IMA: Origin is occluded.  Branch vessels  reconstitute. Inflow: Allowing for mixing artifact, bilateral common, internal, and external iliac arteries are patent. Scattered atherosclerotic calcifications are noted. Review of the MIP images confirms the above findings. NON-VASCULAR Hepatobiliary: Postcholecystectomy.  Unremarkable liver. Pancreas: Atrophic. Spleen: Unremarkable Adrenals/Urinary Tract: Left adrenal gland is within normal limits. There are surgical staples in the right adrenal gland bed suggesting right adrenalectomy. Multiple low-density lesions throughout both kidneys are nonspecific. Most are likely simple cysts. There is a 1.0 cm enhancing lesion in the mid left kidney. See image 17 of series 20. Bladder is unremarkable. Stomach/Bowel: Status post esophagectomy with gastric pull-through is noted. There is no evidence of small-bowel obstruction. Colon is decompressed and without obvious mass. Lymphatic: No abnormal retroperitoneal adenopathy. Reproductive: Normal prostate. Other: No free fluid. Musculoskeletal: Stable lumbar spine. Osteopenia. No vertebral compression. Review of the MIP images confirms the above findings. IMPRESSION: Vascular: Maximal diameter of the ascending aorta is 4.0 cm. Recommend annual imaging followup by CTA or MRA. This recommendation follows 2010 ACCF/AHA/AATS/ACR/ASA/SCA/SCAI/SIR/STS/SVM Guidelines for the Diagnosis and Management of Patients with Thoracic Aortic Disease. Circulation. 2010; 121: S010-X323 There is at least a moderate degree of narrowing at the origin of the left vertebral artery. Infrarenal abdominal aortic aneurysm is present. Maximal diameter is 5.0 cm. 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 Branches reconstitute. This is advantageous for stent graft placement. 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 Nonvascular: Postop changes from gastric pull-through and esophagectomy are noted. There is new soft tissue thickening along the right side of the mediastinum.  Recurrence is not excluded. PET-CT is warranted. Loculated right pleural effusion. Malignant pleural effusion is not excluded. Enhancing lesion in the mid left kidney. Renal cell carcinoma is not excluded. MRI with and without contrast is recommended when feasible. Electronically Signed   By: Marybelle Killings M.D.   On: 03/04/2018 08:39    Time Spent in minutes  30   Jani Gravel M.D on 03/09/2018 at 9:51 AM  Between 7am to 7pm - Pager - [phone]  After 7pm go to [email or web address] - password [facility]  [facility] -  Office  [phone]

## 2018-03-09 NOTE — Plan of Care (Signed)
Pt continues to progress

## 2018-03-10 ENCOUNTER — Inpatient Hospital Stay (HOSPITAL_COMMUNITY): Admission: RE | Admit: 2018-03-10 | Payer: Medicare Other | Source: Ambulatory Visit

## 2018-03-10 ENCOUNTER — Inpatient Hospital Stay (HOSPITAL_COMMUNITY): Payer: Medicare Other

## 2018-03-10 ENCOUNTER — Ambulatory Visit: Payer: Medicare Other | Admitting: Surgery

## 2018-03-10 ENCOUNTER — Encounter (HOSPITAL_COMMUNITY): Payer: Self-pay | Admitting: Radiology

## 2018-03-10 DIAGNOSIS — I340 Nonrheumatic mitral (valve) insufficiency: Secondary | ICD-10-CM

## 2018-03-10 DIAGNOSIS — J9601 Acute respiratory failure with hypoxia: Secondary | ICD-10-CM

## 2018-03-10 DIAGNOSIS — I361 Nonrheumatic tricuspid (valve) insufficiency: Secondary | ICD-10-CM

## 2018-03-10 LAB — CBC
HEMATOCRIT: 35.5 % — AB (ref 39.0–52.0)
Hemoglobin: 11.5 g/dL — ABNORMAL LOW (ref 13.0–17.0)
MCH: 31.1 pg (ref 26.0–34.0)
MCHC: 32.4 g/dL (ref 30.0–36.0)
MCV: 95.9 fL (ref 78.0–100.0)
PLATELETS: 200 10*3/uL (ref 150–400)
RBC: 3.7 MIL/uL — AB (ref 4.22–5.81)
RDW: 15.4 % (ref 11.5–15.5)
WBC: 9.9 10*3/uL (ref 4.0–10.5)

## 2018-03-10 LAB — COMPREHENSIVE METABOLIC PANEL
ALBUMIN: 2.9 g/dL — AB (ref 3.5–5.0)
ALT: 13 U/L — AB (ref 17–63)
AST: 19 U/L (ref 15–41)
Alkaline Phosphatase: 69 U/L (ref 38–126)
Anion gap: 10 (ref 5–15)
BILIRUBIN TOTAL: 0.5 mg/dL (ref 0.3–1.2)
BUN: 25 mg/dL — AB (ref 6–20)
CHLORIDE: 102 mmol/L (ref 101–111)
CO2: 26 mmol/L (ref 22–32)
CREATININE: 1.06 mg/dL (ref 0.61–1.24)
Calcium: 8.8 mg/dL — ABNORMAL LOW (ref 8.9–10.3)
GFR calc Af Amer: >60 mL/min (ref >60)
GFR calc non Af Amer: >60 mL/min (ref >60)
GLUCOSE: 114 mg/dL — AB (ref 65–99)
POTASSIUM: 4 mmol/L (ref 3.5–5.1)
Sodium: 138 mmol/L (ref 135–145)
Total Protein: 5.6 g/dL — ABNORMAL LOW (ref 6.5–8.1)

## 2018-03-10 LAB — ECHOCARDIOGRAM COMPLETE
Height: 70 in
WEIGHTICAEL: 2342.167 [oz_av]

## 2018-03-10 NOTE — Progress Notes (Signed)
  Echocardiogram 2D Echocardiogram has been performed.  Merrie Roof F 03/10/2018, 11:17 AM

## 2018-03-10 NOTE — Progress Notes (Signed)
Bilateral lower extremity venous duplex has been completed. Negative for DVT.  ABI's have been completed. Right 0.7 Left 0.85  03/10/18 10:25 AM [place] RVT

## 2018-03-10 NOTE — Progress Notes (Signed)
Patient ID: [place], male   DOB: Jan 10, 1941, 77 y.o.   MRN: [id number]  PROGRESS NOTE    NAASIR CARREIRA  KVQ:[id number] DOB: 1941-08-11 DOA: 03/08/2018  PCP: Alroy Dust, L.Marlou Sa, MD   Outpatient Specialists:   Brief Narrative:   Assessment & Plan:  77 y.o.malewithhistory of ischemic cardiomyopathy, CAD status post CABG, hypertension, history of lung cancer and esophageal cancer status post resection presents to the ER because of worsening shortness of breath over the last 24-48 hours. Patient states he was doing fine and was playing golf yesterday. Following which he became more short of breath. Shortness of breath increases on lying down flat. Denies any chest pain or productive cough fever or chills.  Patient has a known history of abdominal aortic aneurysm being followed by vascular surgeon. Patient on March 25 had CT angiogram of the chest and abdomen for follow-up with vascular surgeon. At that time CAT scan shows loculated effusion with some mediastinal thickening concerning for possible recurrence of patient's cancer and also there was a concern for left kidney cancer.  Patient is seen by pulmonary and current working diagnosis is no cancer rather pulmonary edema in the setting of COPD.  Principal Problem:   Acute respiratory failure with hypoxia (HCC) Active Problems:   History of esophageal cancer   Hypothyroidism   History of lung cancer   PVD (peripheral vascular disease) (HCC)   Postoperative atrial fibrillation (HCC)   Cardiomyopathy, ischemic   Aneurysm of abdominal vessel (HCC)   Acute on chronic clinical systolic heart failure ([place])   Pulmonary emphysema (HCC)   #1 acute respiratory failure with hypoxemia: Secondary to acute on chronic systolic dysfunction as well as COPD with no exacerbation.  Appreciate pulmonary input.  Continue diureses as well as Nebulizer and Spiriva.  #2 acute on chronic systolic dysfunction: echocardiogram showed EF of 40-45%.  We  will continue treatment for congestive heart failure including diuresis, carvedilol, lisinopril and continue Lipitor.  Patient will need cardiology follow-up.  #3 loculated pleural effusion: Probably related to CHF.  Continue close monitoring.  #4 abnormal enhancement of left kidney: Patient could not have MRI due to suspected foreign body.  Discussed with wife the risks involved.  May need to repeat CT urogram at a later date to evaluate.  #5 History of paroxysmal atrial fibrillation: Patient is in sinus rhythm now.  #6 acute kidney injury: this is resolved.  #7 anemia of chronic diseases: Extremities appears stable.  Continue monitoring   DVT prophylaxis: Lovenox  Code Status:  Full Code   Family Communication: Wife   Disposition Plan:  Home   Consultants:   Pulmonology-Dr Ramaswami  Procedures:  -echocardiogram showing EF of 45-50%  Antimicrobials: None  Subjective: Patient is doing much better.  No nausea vomiting or diarrhea.  No new complaint.  Mild shortness of breath with exertion.  Objective: Vitals:   03/10/18 0839 03/10/18 0845 03/10/18 0855 03/10/18 0856  BP: 116/69 94/65    Pulse: 73 72    Resp:  18    Temp:  97.6 F (36.4 C)    TempSrc:  Oral    SpO2:  99% 96% 96%  Weight:      Height:        Intake/Output Summary (Last 24 hours) at 03/10/2018 0922 Last data filed at 03/10/2018 0837 Gross per 24 hour  Intake 960 ml  Output 1400 ml  Net -440 ml   Filed Weights   03/08/18 2136 03/09/18 0500 03/10/18 0500  Weight: 67.3  kg (148 lb 5.9 oz) 67 kg (147 lb 11.3 oz) 66.4 kg (146 lb 6.2 oz)    Examination:  General exam: Appears calm and comfortable  Respiratory system: Clear to auscultation. Respiratory effort normal. Cardiovascular system: S1 & S2 heard, RRR. No JVD, murmurs, rubs, gallops or clicks. No pedal edema. Gastrointestinal system: Abdomen is nondistended, soft and nontender. No organomegaly or masses felt. Normal bowel sounds  heard. Central nervous system: Alert and oriented. No focal neurological deficits. Extremities: Symmetric 5 x 5 power. Skin: No rashes, lesions or ulcers Psychiatry: Judgement and insight appear normal. Mood & affect appropriate.     Data Reviewed: I have personally reviewed following labs and imaging studies  CBC: Recent Labs  Lab 03/08/18 1743 03/08/18 2321 03/10/18 0238  WBC 5.4 7.2 9.9  NEUTROABS 3.6  --   --   HGB 12.7* 12.4* 11.5*  HCT 39.8 38.5* 35.5*  MCV 97.3 96.3 95.9  PLT 199 192 295   Basic Metabolic Panel: Recent Labs  Lab 03/08/18 1743 03/08/18 2321 03/10/18 0238  NA 140 138 138  K 4.3 4.1 4.0  CL 106 103 102  CO2 26 23 26   GLUCOSE 92 229* 114*  BUN 21* 22* 25*  CREATININE 1.31* 1.24 1.06  CALCIUM 9.1 8.8* 8.8*   GFR: Estimated Creatinine Clearance: 56.6 mL/min (by C-G formula based on SCr of 1.06 mg/dL). Liver Function Tests: Recent Labs  Lab 03/08/18 1743 03/10/18 0238  AST 23 19  ALT 13* 13*  ALKPHOS 79 69  BILITOT 0.8 0.5  PROT 6.5 5.6*  ALBUMIN 3.3* 2.9*   No results for input(s): LIPASE, AMYLASE in the last 168 hours. No results for input(s): AMMONIA in the last 168 hours. Coagulation Profile: No results for input(s): INR, PROTIME in the last 168 hours. Cardiac Enzymes: Recent Labs  Lab 03/08/18 2321  TROPONINI <0.03   BNP (last 3 results) No results for input(s): PROBNP in the last 8760 hours. HbA1C: No results for input(s): HGBA1C in the last 72 hours. CBG: No results for input(s): GLUCAP in the last 168 hours. Lipid Profile: No results for input(s): CHOL, HDL, LDLCALC, TRIG, CHOLHDL, LDLDIRECT in the last 72 hours. Thyroid Function Tests: No results for input(s): TSH, T4TOTAL, FREET4, T3FREE, THYROIDAB in the last 72 hours. Anemia Panel: No results for input(s): VITAMINB12, FOLATE, FERRITIN, TIBC, IRON, RETICCTPCT in the last 72 hours. Urine analysis:    Component Value Date/Time   COLORURINE AMBER (A) 07/30/2013 1422    APPEARANCEUR CLEAR 07/30/2013 1422   LABSPEC 1.028 07/30/2013 1422   PHURINE 5.0 07/30/2013 1422   GLUCOSEU NEGATIVE 07/30/2013 1422   GLUCOSEU NEGATIVE 08/22/2012 1020   HGBUR NEGATIVE 07/30/2013 1422   BILIRUBINUR SMALL (A) 07/30/2013 1422   KETONESUR NEGATIVE 07/30/2013 1422   PROTEINUR NEGATIVE 07/30/2013 1422   UROBILINOGEN 1.0 07/30/2013 1422   NITRITE NEGATIVE 07/30/2013 1422   LEUKOCYTESUR NEGATIVE 07/30/2013 1422   Sepsis Labs: @LABRCNTIP (procalcitonin:4,lacticidven:4)  )No results found for this or any previous visit (from the past 240 hour(s)).       Radiology Studies: Nm Pulmonary Perf And Vent  Result Date: 03/09/2018 CLINICAL DATA:  Evaluate for pulmonary embolus. EXAM: NUCLEAR MEDICINE VENTILATION - PERFUSION LUNG SCAN TECHNIQUE: Ventilation images were obtained in multiple projections using inhaled aerosol Tc-47m DTPA. Perfusion images were obtained in multiple projections after intravenous injection of Tc-83m-MAA. RADIOPHARMACEUTICALS:  32.5 mCi of Tc-32m DTPA aerosol inhalation and 4.12 mCi Tc31m-MAA IV COMPARISON:  Chest radiograph 03/08/2018 FINDINGS: Ventilation: There is a large defect  within the right midlung which corresponds to the right loculated effusion in the right midlung. Peripheral nonsegmental decreased ventilation to the right upper lobe is identified. This corresponds with an area of peripheral pleural thickening or loculated pleural fluid on chest radiograph. Perfusion: There is a perfusion defect within the right midlung corresponding to the ventilation abnormality. No wedge-shaped/segmental perfusion defects identified to suggest acute pulmonary embolus. Nonsegmental decreased perfusion to the periphery of the right upper lobe corresponds to the ventilation and chest radiograph abnormality. IMPRESSION: No suspicious segmental perfusion defects identified to suggest acute pulmonary embolus. Electronically Signed   By: Kerby Moors M.D.   On:  03/09/2018 11:19   Dg Chest Port 1 View  Result Date: 03/08/2018 CLINICAL DATA:  Shortness of breath. Right-sided lung cancer with surgery and radiation therapy. Pneumonia. Coronary artery disease. Aneurysm. EXAM: PORTABLE CHEST 1 VIEW COMPARISON:  Scout from the CT of 03/03/2018. FINDINGS: Midline trachea. Mild cardiomegaly. Prior median sternotomy. Small left pleural effusion is similar. Loculated right-sided pleural effusion is not significantly changed. Biapical pleuroparenchymal scarring. No pneumothorax. Basilar predominant interstitial thickening is moderate. No new pulmonary opacity. Scarring at the left lung base. IMPRESSION: Compared to the scout film of the CT of 03/03/2018, no gross interval change. Bilateral pleural effusions, including a loculated right-sided effusion. Chronic interstitial thickening suspicious for mild pulmonary venous congestion, superimposed upon emphysema. Electronically Signed   By: Abigail Miyamoto M.D.   On: 03/08/2018 18:05        Scheduled Meds: . aspirin EC  81 mg Oral Daily  . atorvastatin  40 mg Oral q1800  . budesonide (PULMICORT) nebulizer solution  0.25 mg Nebulization BID  . carvedilol  12.5 mg Oral BID WC  . enoxaparin (LOVENOX) injection  40 mg Subcutaneous Q24H  . feeding supplement (PRO-STAT SUGAR FREE 64)  30 mL Oral BID  . furosemide  20 mg Intravenous Daily  . lisinopril  5 mg Oral Daily  . tiotropium  18 mcg Inhalation Daily   Continuous Infusions:   LOS: 2 days    Time spent: 30 minutes    Twilla Khouri,LAWAL, MD [facility] Pager [phone]  If 7PM-7AM, please contact night-coverage [email or web address] Password [email or web address] 03/10/2018, 9:22 AM

## 2018-03-11 DIAGNOSIS — J9601 Acute respiratory failure with hypoxia: Secondary | ICD-10-CM

## 2018-03-11 MED ORDER — BUDESONIDE 0.25 MG/2ML IN SUSP: 0.2500 mg | Freq: Two times a day (BID) | RESPIRATORY_TRACT | 12 refills | Status: DC

## 2018-03-11 MED ORDER — FUROSEMIDE 20 MG PO TABS: 20.0000 mg | ORAL_TABLET | Freq: Every day | ORAL | 11 refills | Status: DC

## 2018-03-11 MED ORDER — ALBUTEROL SULFATE (2.5 MG/3ML) 0.083% IN NEBU: 2.5000 mg | INHALATION_SOLUTION | RESPIRATORY_TRACT | 12 refills | Status: DC | PRN

## 2018-03-11 MED ORDER — TIOTROPIUM BROMIDE MONOHYDRATE 18 MCG IN CAPS: 18.0000 ug | ORAL_CAPSULE | Freq: Every day | RESPIRATORY_TRACT | 12 refills | Status: DC

## 2018-03-11 MED ORDER — IPRATROPIUM-ALBUTEROL 0.5-2.5 (3) MG/3ML IN SOLN: 3.0000 mL | RESPIRATORY_TRACT | 0 refills | Status: DC | PRN

## 2018-03-11 NOTE — Consult Note (Addendum)
Name: Glen Green MRN: [id number] DOB: 1941-06-10    ADMISSION DATE:  03/08/2018 CONSULTATION DATE:  03/09/18  REFERRING MD :  [facility], Dr. Lacinda Axon  CHIEF COMPLAINT:  Loculated pleural effusion   BRIEF PATIENT DESCRIPTION:  77 yo male with CM , CAD, AAA  with previous history of lung cancer and esophageal cancer s/p resection admitted 3/30 with dyspnea /hypoxia found to have bilateral effusions, elevated BNP along with loculated right pleural effusion . Pulmonary consulted for loculated effusion   SIGNIFICANT EVENTS  Admit to hospital 3/30   STUDIES:  CT chest 03/03/18 >emphysema , loculated right pleural effusion , soft tissue thickening along the right side of the mediastinum , enhancing kidney lesion .    HISTORY OF PRESENT ILLNESS:   77 year old male former smoker with known history of ischemic cardiomyopathy, coronary artery disease status post CABG (2014) , hypertension admitted on March 08, 2018 with dyspnea and hypoxia.  Patient has a history of lung cancer  status post LUL resection.   Previous esophageal cancer s/p resection/chemo Patient presented to the ER on March 30 with progressive shortness of breath and orthopnea for 4-5 days. Was able to play golf day before admission . Patient has a known abdominal aortic aneurysm being followed by vascular surgery.  He had a CT chest and abdomen on March 25 that showed a loculated effusion on the right with some mediastinal thickening with possible concern for recurrence of patient's cancer.  There was also a left kidney lesion.  In the emergency room patient was found to have on chest x-ray and bilateral pleural effusion with right sided loculated effusion.  His BNP was elevated around 700.  Troponin was negative.  EKG showed a normal sinus rhythm.  He was treated for possible bronchitis with IV steroids.  Previous echo in 2015 showed an EF of 30-35%.  Echo is pending.  D-dimer was elevated.  Venous Doppler pending  and VQ scan low prob for PE .   Patient had elevated serum creatinine.  Patient was given Lasix 40 mg.  Since admission patient is feeling some better.  Pulmonary was consulted to evaluate for his loculated effusion on the right..  03/10/2018>> Echo  EF 78-67%, Grade 2 diastolic dysfunction, MV mild to moderate regurgitation, LA moderately dilated, TV moderate regurgitation, PAP 26mm Hg ( Moderately increased)  PAP in 2014 was 33 mm Hg.   4/1>> Doppler Study negative for DVT  SUBJECTIVE: Breathing feeling some better , anxious to get home.  VITAL SIGNS: Temp:  [97.5 F (36.4 C)-97.6 F (36.4 C)] 97.6 F (36.4 C) (04/02 0825) Pulse Rate:  [65-73] 73 (04/02 0825) Resp:  [20] 20 (04/02 0825) BP: (103-112)/(59-80) 103/63 (04/02 0825) SpO2:  [97 %-99 %] 97 % (04/02 0825) Weight:  [145 lb 15.1 oz (66.2 kg)] 145 lb 15.1 oz (66.2 kg) (04/02 0500)  PHYSICAL EXAMINATION: GEN: A/Ox3; supine in bed , pleasant , NAD, frail elderly male.   HEENT:  [place]/AT, NO JVD, no thyromegally or nodules noted, NO LAD   RESP  Clear throughout, no wheeze noted, strong cough, minimal secretions  CARD: S1, S2,  RRR, no m/r/g  , no peripheral edema, pulses intact, no cyanosis or clubbing.  GI:  Soft, NT, ND, BS +  [place]: No obvious deformities noted  Neuro: Alert and Oriented x 3, MAE x 4, deconditioned    Skin: Warm, no lesions or rashes    Recent Labs  Lab 03/08/18 1743 03/08/18 2321 03/10/18 0238  NA 140  138 138  K 4.3 4.1 4.0  CL 106 103 102  CO2 26 23 26   BUN 21* 22* 25*  CREATININE 1.31* 1.24 1.06  GLUCOSE 92 229* 114*   Recent Labs  Lab 03/08/18 1743 03/08/18 2321 03/10/18 0238  HGB 12.7* 12.4* 11.5*  HCT 39.8 38.5* 35.5*  WBC 5.4 7.2 9.9  PLT 199 192 200   No results found.  ASSESSMENT / PLAN:  1. Loculated Right Pleural Effusion along the right major fissure. Has previous hx of Lung cancer and Esophageal cancer w/ prev resection . Does not appear infectious as no fever or leukocytosis , ? 2/2 CHF  T Max  97.6 WBC 9.9  Plan  Continue diuresis and monitor for clinical/ imaging  improvement  CXR 4/3. BMET prn Negative 1300 cc since admission    2. Bilateral pleural Effusions with underlying CHF -elevated BNP VQ scan neg .   Plan  Diuresis as scr /B/p allow  Echo results as above Doppler study negative    3. Emphysema on CT chest -not on meds at home>> New diagnosis   Plan  Cont Spiriva [place] Pulmocort Albuterol prn as rescue Will need pulmonary follow up with PFT's to stage COPD work up [facility]. Can be done as an outpatient  4. 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 Pt. Followed by [facility] as original wedge resection done by Dr. Arlyce Dice ( Sees Servando Snare now)   [facility] Follow Up 4/24 at 3 pm with Eric Form, NP Pulmonary consult with DrLamonte Sakai 5/2 at 3 pm  Make sure you keep both appointments.   Magdalen Spatz NP-C  Pulmonary and [place] Pager: [phone]  03/11/2018, 11:12 AM

## 2018-03-11 NOTE — Discharge Summary (Signed)
Physician Discharge Summary  Glen Green PJK:[id number] DOB: 04-03-1941 DOA: 03/08/2018  PCP: Alroy Dust, L.Marlou Sa, MD  Admit date: 03/08/2018 Discharge date: 03/11/2018  Time spent: 35 minutes  Recommendations for Outpatient Follow-up:  1. Follow-up with PCP 2. Need evaluation for suspected renal cyst.  MRI if confirmed that patient has no metal  3. Follow-up with cardiologist.  Continue Lasix with Coreg and lisinopril.  Follow-up BMP within a week 4. Follow up with Pulmonary for COPD   Discharge Diagnoses:  Principal Problem:   Acute respiratory failure with hypoxia ([place]) Active Problems:   History of esophageal cancer   Hypothyroidism   History of lung cancer   PVD (peripheral vascular disease) (HCC)   Postoperative atrial fibrillation (HCC)   Cardiomyopathy, ischemic   Aneurysm of abdominal vessel (HCC)   Acute on chronic clinical systolic heart failure (HCC)   Pulmonary emphysema (HCC)   Discharge Condition: Good  Diet recommendation: Cardiac, Heart healthy  Filed Weights   03/09/18 0500 03/10/18 0500 03/11/18 0500  Weight: 67 kg (147 lb 11.3 oz) 66.4 kg (146 lb 6.2 oz) 66.2 kg (145 lb 15.1 oz)    History of present illness:  A 77 yo male with CM , CAD, AAA  with previous history of lung cancer and esophageal cancer s/p resection admitted 3/30 with dyspnea /hypoxia found to have bilateral effusions, elevated BNP along with loculated right pleural effusion. Patient initially thought to have malignancy and was admitted to [facility] with Pulmonary Consult.  Hospital Course:  Patient was admitted and initially treated for bronchitis with steroids and nebulizer.  His BNP was 700 and EKG was showing left bundle branch block.  He had history of abdominal aortic aneurysm which appears to be stable his CT scan also suggested mediastinal thickening concerning for possible recurrence of patient's lung cancer all his age of cancer. He had bilateral pleural effusions.  Pulmonary as  well as cardiac thoracic surgery were consulted initially.  He was evaluated and the conclusion is this is more than likely cardiovascular.  The finding is that of effusion in the lung fissure which gives the impression of an mass.  His EF was 30-35% in July 2015.  Has not been on diuretic.  Repeat echocardiogram shows his EF of 45-50%.  He was treated with diuresis.  Cardiac workup was completed including Doppler ultrasound of the slight extremity due to recurrent claudication.  His CT also showed left kidney enhancement concerning for recurrence of patient's cancer.  MRI was attempted however there was concern the patient may have had previous foreign body.  History is not readily available.  For safety reasons MRI was canceled.  This can be followed up by his PCP on reordered if deemed safe. With patient's home 5 systolic dysfunction he was maintained on lisinopril and Coreg.  Currently on low dose diuretics.  He appears to have COPD that has not previously been diagnosed on pulmonary has followed all.  We are sending him home on nebulizer treatments and Pulmicort.  She will follow up with pulmonary in the outpatient setting.  Patient also had ABIs showing significant mild to moderate narrowing in his right lower extremity arterial disease. The right toe-brachial index is abnormal.   Procedures:  CT chest  ABI  Doppler US LE   Consultations:  Pulmonary, Dr Lynford Citizen  Discharge Exam: Vitals:   03/11/18 0824 03/11/18 0825  BP:  103/63  Pulse:  73  Resp:  20  Temp:  97.6 F (36.4 C)  SpO2: 97%  97%    General: Stable, NAD Cardiovascular: RRR Respiratory: Good AE Bilaterally  Discharge Instructions   Discharge Instructions    Diet - low sodium heart healthy   Complete by:  As directed    Diet - low sodium heart healthy   Complete by:  As directed    Increase activity slowly   Complete by:  As directed    Increase activity slowly   Complete by:  As directed      Allergies as  of 03/11/2018   No Known Allergies     Medication List    TAKE these medications   albuterol (2.5 MG/3ML) 0.083% nebulizer solution Commonly known as:  PROVENTIL Take 3 mLs (2.5 mg total) by nebulization every 2 (two) hours as needed for wheezing.   aspirin EC 81 MG tablet Take 1 tablet (81 mg total) by mouth daily.   atorvastatin 40 MG tablet Commonly known as:  LIPITOR Take 1 tablet (40 mg total) by mouth daily at 6 PM. Please keep upcoming appt for future refills. Thanks   budesonide 0.25 MG/2ML nebulizer solution Commonly known as:  PULMICORT Take 2 mLs (0.25 mg total) by nebulization 2 (two) times daily.   carvedilol 12.5 MG tablet Commonly known as:  COREG TAKE 1 TABLET (12.5 MG TOTAL) BY MOUTH 2 (TWO) TIMES DAILY   diphenhydramine-acetaminophen 25-500 MG Tabs tablet Commonly known as:  TYLENOL PM Take 2 tablets by mouth at bedtime as needed (for sleep better).   furosemide 20 MG tablet Commonly known as:  LASIX Take 1 tablet (20 mg total) by mouth daily.   guaiFENesin 600 MG 12 hr tablet Commonly known as:  MUCINEX Take 1,200 mg by mouth 2 (two) times daily as needed for congestion.   ipratropium-albuterol 0.5-2.5 (3) MG/3ML Soln Commonly known as:  DUONEB Take 3 mLs by nebulization every 4 (four) hours as needed.   lisinopril 5 MG tablet Commonly known as:  PRINIVIL,ZESTRIL TAKE 1 TABLET BY MOUTH EVERY DAY What changed:    how much to take  how to take this  when to take this   tiotropium 18 MCG inhalation capsule Commonly known as:  SPIRIVA Place 1 capsule (18 mcg total) into inhaler and inhale daily. Start taking on:  03/12/2018      No Known Allergies Follow-up Information    Glen Spatz, NP Follow up on 04/02/2018.   Specialty:  Pulmonary Disease Why:    please be there at 3 pm for 3:15 appointment and CXR prior to visit. Thank you Contact information: [address] [place] [address] [phone]            The  results of significant diagnostics from this hospitalization (including imaging, microbiology, ancillary and laboratory) are listed below for reference.    Significant Diagnostic Studies: Nm Pulmonary Perf And Vent  Result Date: 03/09/2018 CLINICAL DATA:  Evaluate for pulmonary embolus. EXAM: NUCLEAR MEDICINE VENTILATION - PERFUSION LUNG SCAN TECHNIQUE: Ventilation images were obtained in multiple projections using inhaled aerosol Tc-74m DTPA. Perfusion images were obtained in multiple projections after intravenous injection of Tc-36m-MAA. RADIOPHARMACEUTICALS:  32.5 mCi of Tc-76m DTPA aerosol inhalation and 4.12 mCi Tc109m-MAA IV COMPARISON:  Chest radiograph 03/08/2018 FINDINGS: Ventilation: There is a large defect within the right midlung which corresponds to the right loculated effusion in the right midlung. Peripheral nonsegmental decreased ventilation to the right upper lobe is identified. This corresponds with an area of peripheral pleural thickening or loculated pleural fluid on chest radiograph.  Perfusion: There is a perfusion defect within the right midlung corresponding to the ventilation abnormality. No wedge-shaped/segmental perfusion defects identified to suggest acute pulmonary embolus. Nonsegmental decreased perfusion to the periphery of the right upper lobe corresponds to the ventilation and chest radiograph abnormality. IMPRESSION: No suspicious segmental perfusion defects identified to suggest acute pulmonary embolus. Electronically Signed   By: Kerby Moors M.D.   On: 03/09/2018 11:19   Dg Chest Port 1 View  Result Date: 03/08/2018 CLINICAL DATA:  Shortness of breath. Right-sided lung cancer with surgery and radiation therapy. Pneumonia. Coronary artery disease. Aneurysm. EXAM: PORTABLE CHEST 1 VIEW COMPARISON:  Scout from the CT of 03/03/2018. FINDINGS: Midline trachea. Mild cardiomegaly. Prior median sternotomy. Small left pleural effusion is similar. Loculated right-sided pleural  effusion is not significantly changed. Biapical pleuroparenchymal scarring. No pneumothorax. Basilar predominant interstitial thickening is moderate. No new pulmonary opacity. Scarring at the left lung base. IMPRESSION: Compared to the scout film of the CT of 03/03/2018, no gross interval change. Bilateral pleural effusions, including a loculated right-sided effusion. Chronic interstitial thickening suspicious for mild pulmonary venous congestion, superimposed upon emphysema. Electronically Signed   By: Abigail Miyamoto M.D.   On: 03/08/2018 18:05   Ct Angio Chest Aorta W &/or Wo Contrast  Result Date: 03/04/2018 CLINICAL DATA:  Preop planning for abdominal aortic aneurysm EXAM: CT ANGIOGRAPHY CHEST, ABDOMEN AND PELVIS TECHNIQUE: Multidetector CT imaging through the chest, abdomen and pelvis was performed using the standard protocol during bolus administration of intravenous contrast. Multiplanar reconstructed images and MIPs were obtained and reviewed to evaluate the vascular anatomy. CONTRAST:  22mL ISOVUE-370 IOPAMIDOL (ISOVUE-370) INJECTION 76% Creatinine was obtained on site at [place] at [address] Results: Creatinine 1.1 mg/dL. COMPARISON:  Multiple prior studies including chest CTs, abdomen CTs, abdomen pelvis CTs, and PET CTs. FINDINGS: CTA CHEST FINDINGS Cardiovascular: Maximal diameter of the ascending aorta is 4.0 cm which is unchanged based on my direct measurements on the prior study. There is no evidence of intramural hematoma or aortic dissection. Great vessels are patent. Right vertebral artery is patent. There is at least a moderate degree of narrowing at the origin of the left vertebral artery. There is smooth and calcified plaque at its origin. Left subclavian artery is patent. Atherosclerotic calcifications are present at the aortic arch extending into the origin of the left subclavian artery. There is some calcified and smooth plaque in the left vertebral artery in the  neck. There is no obvious acute pulmonary thromboembolism. 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 Moderate left main, circumflex, and LAD territory coronary artery calcification. Saphenous vein bypass grafts are present with CABG and sternotomy. Atherosclerotic changes of the descending thoracic aorta are characterized by calcification. No luminal narrowing. Mediastinum/Nodes: Status post esophagectomy and gastric pull-through has a stable appearance. There is stranding within the mediastinum consistent with postoperative changes. Soft tissue density adjacent to the SVC, along the right side of the anterior mediastinum has developed since the prior study. See image 75 of series 4. This may represent a pleural abnormality or loculated complex pleural fluid. Thyroid is unremarkable. Lungs/Pleura: Severe emphysema. A loculated right pleural effusion is present with fluid in the right major fissure as well as surrounding the right lung. Heterogeneous opacities at the base of the right lower and middle lobes are nonspecific. No pneumothorax. Musculoskeletal: Stable appearance of the thoracic spine with a mid-level compression deformity. Chronic right rib deformities. Review of the MIP images confirms the above findings. CTA  ABDOMEN AND PELVIS FINDINGS VASCULAR Aorta: An infrarenal abdominal aortic aneurysm is present. Maximal AP and transverse diameters are 5.0 and 4.9 cm compared with 3.5 cm in 2011. Chronic mural thrombus in the aneurysmal segment is noted. There is mixing artifact noted in the abdominal aorta. Celiac: Moderate narrowing secondary to atherosclerosis and median arcuate ligament syndrome. Left gastric artery has likely been ligated. The lesser curvature is reperfused through the right gastric artery. SMA: Patent. Atherosclerotic calcification at the origin. Branch vessels grossly patent. Renals: Single renal arteries are widely patent with scattered atherosclerotic calcification.  There are no obvious accessory renal arteries. IMA: Origin is occluded.  Branch vessels reconstitute. Inflow: Allowing for mixing artifact, bilateral common, internal, and external iliac arteries are patent. Scattered atherosclerotic calcifications are noted. Review of the MIP images confirms the above findings. NON-VASCULAR Hepatobiliary: Postcholecystectomy.  Unremarkable liver. Pancreas: Atrophic. Spleen: Unremarkable Adrenals/Urinary Tract: Left adrenal gland is within normal limits. There are surgical staples in the right adrenal gland bed suggesting right adrenalectomy. Multiple low-density lesions throughout both kidneys are nonspecific. Most are likely simple cysts. There is a 1.0 cm enhancing lesion in the mid left kidney. See image 17 of series 20. Bladder is unremarkable. Stomach/Bowel: Status post esophagectomy with gastric pull-through is noted. There is no evidence of small-bowel obstruction. Colon is decompressed and without obvious mass. Lymphatic: No abnormal retroperitoneal adenopathy. Reproductive: Normal prostate. Other: No free fluid. Musculoskeletal: Stable lumbar spine. Osteopenia. No vertebral compression. Review of the MIP images confirms the above findings. IMPRESSION: Vascular: Maximal diameter of the ascending aorta is 4.0 cm. Recommend annual imaging followup by CTA or MRA. This recommendation follows 2010 ACCF/AHA/AATS/ACR/ASA/SCA/SCAI/SIR/STS/SVM Guidelines for the Diagnosis and Management of Patients with Thoracic Aortic Disease. Circulation. 2010; 121: H846-N629 There is at least a moderate degree of narrowing at the origin of the left vertebral artery. Infrarenal abdominal aortic aneurysm is present. Maximal diameter is 5.0 cm. 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 Branches reconstitute. This is advantageous for stent graft placement. 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 Nonvascular: Postop changes from gastric pull-through and esophagectomy are  noted. There is new soft tissue thickening along the right side of the mediastinum. Recurrence is not excluded. PET-CT is warranted. Loculated right pleural effusion. Malignant pleural effusion is not excluded. Enhancing lesion in the mid left kidney. Renal cell carcinoma is not excluded. MRI with and without contrast is recommended when feasible. Electronically Signed   By: Marybelle Killings M.D.   On: 03/04/2018 08:39   Ct Angio Abdomen Pelvis  W &/or Wo Contrast  Result Date: 03/04/2018 CLINICAL DATA:  Preop planning for abdominal aortic aneurysm EXAM: CT ANGIOGRAPHY CHEST, ABDOMEN AND PELVIS TECHNIQUE: Multidetector CT imaging through the chest, abdomen and pelvis was performed using the standard protocol during bolus administration of intravenous contrast. Multiplanar reconstructed images and MIPs were obtained and reviewed to evaluate the vascular anatomy. CONTRAST:  57mL ISOVUE-370 IOPAMIDOL (ISOVUE-370) INJECTION 76% Creatinine was obtained on site at [place] at [address] Results: Creatinine 1.1 mg/dL. COMPARISON:  Multiple prior studies including chest CTs, abdomen CTs, abdomen pelvis CTs, and PET CTs. FINDINGS: CTA CHEST FINDINGS Cardiovascular: Maximal diameter of the ascending aorta is 4.0 cm which is unchanged based on my direct measurements on the prior study. There is no evidence of intramural hematoma or aortic dissection. Great vessels are patent. Right vertebral artery is patent. There is at least a moderate degree of narrowing at the origin of the left vertebral  artery. There is smooth and calcified plaque at its origin. Left subclavian artery is patent. Atherosclerotic calcifications are present at the aortic arch extending into the origin of the left subclavian artery. There is some calcified and smooth plaque in the left vertebral artery in the neck. There is no obvious acute pulmonary thromboembolism. 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 Moderate  left main, circumflex, and LAD territory coronary artery calcification. Saphenous vein bypass grafts are present with CABG and sternotomy. Atherosclerotic changes of the descending thoracic aorta are characterized by calcification. No luminal narrowing. Mediastinum/Nodes: Status post esophagectomy and gastric pull-through has a stable appearance. There is stranding within the mediastinum consistent with postoperative changes. Soft tissue density adjacent to the SVC, along the right side of the anterior mediastinum has developed since the prior study. See image 75 of series 4. This may represent a pleural abnormality or loculated complex pleural fluid. Thyroid is unremarkable. Lungs/Pleura: Severe emphysema. A loculated right pleural effusion is present with fluid in the right major fissure as well as surrounding the right lung. Heterogeneous opacities at the base of the right lower and middle lobes are nonspecific. No pneumothorax. Musculoskeletal: Stable appearance of the thoracic spine with a mid-level compression deformity. Chronic right rib deformities. Review of the MIP images confirms the above findings. CTA ABDOMEN AND PELVIS FINDINGS VASCULAR Aorta: An infrarenal abdominal aortic aneurysm is present. Maximal AP and transverse diameters are 5.0 and 4.9 cm compared with 3.5 cm in 2011. Chronic mural thrombus in the aneurysmal segment is noted. There is mixing artifact noted in the abdominal aorta. Celiac: Moderate narrowing secondary to atherosclerosis and median arcuate ligament syndrome. Left gastric artery has likely been ligated. The lesser curvature is reperfused through the right gastric artery. SMA: Patent. Atherosclerotic calcification at the origin. Branch vessels grossly patent. Renals: Single renal arteries are widely patent with scattered atherosclerotic calcification. There are no obvious accessory renal arteries. IMA: Origin is occluded.  Branch vessels reconstitute. Inflow: Allowing for mixing  artifact, bilateral common, internal, and external iliac arteries are patent. Scattered atherosclerotic calcifications are noted. Review of the MIP images confirms the above findings. NON-VASCULAR Hepatobiliary: Postcholecystectomy.  Unremarkable liver. Pancreas: Atrophic. Spleen: Unremarkable Adrenals/Urinary Tract: Left adrenal gland is within normal limits. There are surgical staples in the right adrenal gland bed suggesting right adrenalectomy. Multiple low-density lesions throughout both kidneys are nonspecific. Most are likely simple cysts. There is a 1.0 cm enhancing lesion in the mid left kidney. See image 17 of series 20. Bladder is unremarkable. Stomach/Bowel: Status post esophagectomy with gastric pull-through is noted. There is no evidence of small-bowel obstruction. Colon is decompressed and without obvious mass. Lymphatic: No abnormal retroperitoneal adenopathy. Reproductive: Normal prostate. Other: No free fluid. Musculoskeletal: Stable lumbar spine. Osteopenia. No vertebral compression. Review of the MIP images confirms the above findings. IMPRESSION: Vascular: Maximal diameter of the ascending aorta is 4.0 cm. Recommend annual imaging followup by CTA or MRA. This recommendation follows 2010 ACCF/AHA/AATS/ACR/ASA/SCA/SCAI/SIR/STS/SVM Guidelines for the Diagnosis and Management of Patients with Thoracic Aortic Disease. Circulation. 2010; 121: I627-O350 There is at least a moderate degree of narrowing at the origin of the left vertebral artery. Infrarenal abdominal aortic aneurysm is present. Maximal diameter is 5.0 cm. 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 Branches reconstitute. This is advantageous for stent graft placement. 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 Nonvascular: Postop changes from gastric pull-through and esophagectomy are noted. There is new soft tissue thickening along the right side  of the mediastinum. Recurrence is not excluded. PET-CT is warranted.  Loculated right pleural effusion. Malignant pleural effusion is not excluded. Enhancing lesion in the mid left kidney. Renal cell carcinoma is not excluded. MRI with and without contrast is recommended when feasible. Electronically Signed   By: Marybelle Killings M.D.   On: 03/04/2018 08:39    Microbiology: No results found for this or any previous visit (from the past 240 hour(s)).   Labs: Basic Metabolic Panel: Recent Labs  Lab 03/08/18 1743 03/08/18 2321 03/10/18 0238  NA 140 138 138  K 4.3 4.1 4.0  CL 106 103 102  CO2 26 23 26   GLUCOSE 92 229* 114*  BUN 21* 22* 25*  CREATININE 1.31* 1.24 1.06  CALCIUM 9.1 8.8* 8.8*   Liver Function Tests: Recent Labs  Lab 03/08/18 1743 03/10/18 0238  AST 23 19  ALT 13* 13*  ALKPHOS 79 69  BILITOT 0.8 0.5  PROT 6.5 5.6*  ALBUMIN 3.3* 2.9*   No results for input(s): LIPASE, AMYLASE in the last 168 hours. No results for input(s): AMMONIA in the last 168 hours. CBC: Recent Labs  Lab 03/08/18 1743 03/08/18 2321 03/10/18 0238  WBC 5.4 7.2 9.9  NEUTROABS 3.6  --   --   HGB 12.7* 12.4* 11.5*  HCT 39.8 38.5* 35.5*  MCV 97.3 96.3 95.9  PLT 199 192 200   Cardiac Enzymes: Recent Labs  Lab 03/08/18 2321  TROPONINI <0.03   BNP: BNP (last 3 results) Recent Labs    03/08/18 1743  BNP 733.9*    ProBNP (last 3 results) No results for input(s): PROBNP in the last 8760 hours.  CBG: No results for input(s): GLUCAP in the last 168 hours.     SignedBarbette Merino MD.  [facility] 03/11/2018, 12:35 PM

## 2018-03-11 NOTE — Care Management Note (Signed)
Case Management Note  Patient Details  Name: Glen Green MRN: [id number] Date of Birth: [birth year]  Subjective/Objective:            Spoke to patient and wife at bedside. They do not have nebulizer, noted that nebulized meds are on AVS, so order placed and [facility] will deliver to room prior to DC. Spoke w RN who states patient has been on RA and walked in the hallway and did not demonstrate need for home O2. No other CM needs identified.        Action/Plan:   Expected Discharge Date:  03/11/18               Expected Discharge Plan:  Home/Self Care  In-House Referral:     Discharge planning Services  CM Consult  Post Acute Care Choice:  Durable Medical Equipment Choice offered to:     DME Arranged:  Nebulizer/meds DME Agency:  [place]:    [facility] Agency:     Status of Service:  Completed, signed off  If discussed at [place] of Stay Meetings, dates discussed:    Additional Comments:  Carles Collet, RN 03/11/2018, 1:54 PM

## 2018-03-11 NOTE — Progress Notes (Signed)
Ambulated patient in hallway on room air. Oxygen saturation remained at 94%. Patient denied SOB.  Patient was steady on his feet. Advised patient to follow Dr. Leontine Green discharge instructions which state that patient should increase activity slowly. Patient and his spouse verbalized understanding.

## 2018-03-11 NOTE — Progress Notes (Signed)
I have assessed the patient and agree with [facility] charting.

## 2018-03-18 ENCOUNTER — Other Ambulatory Visit: Payer: Self-pay | Admitting: Family Medicine

## 2018-03-18 DIAGNOSIS — N2889 Other specified disorders of kidney and ureter: Secondary | ICD-10-CM

## 2018-03-20 ENCOUNTER — Other Ambulatory Visit: Payer: Self-pay | Admitting: Cardiology

## 2018-03-24 ENCOUNTER — Other Ambulatory Visit: Payer: Self-pay | Admitting: Family Medicine

## 2018-03-24 ENCOUNTER — Telehealth: Payer: Self-pay | Admitting: Acute Care

## 2018-03-24 DIAGNOSIS — I671 Cerebral aneurysm, nonruptured: Secondary | ICD-10-CM

## 2018-03-24 NOTE — Telephone Encounter (Signed)
Spoke with pt's wife. The pt saw Glen Green while he was hospitalized. She was wanting to know if the pt could take the albuterol neb solution while taking the Pulmicort. Advised her that he could. Nothing further was needed at this time.

## 2018-03-25 ENCOUNTER — Other Ambulatory Visit: Payer: Medicare Other

## 2018-03-26 ENCOUNTER — Ambulatory Visit
Admission: RE | Admit: 2018-03-26 | Discharge: 2018-03-26 | Disposition: A | Discharge status: Home / Self Care | Payer: Medicare Other | Source: Ambulatory Visit | Attending: Family Medicine | Admitting: Family Medicine

## 2018-03-26 DIAGNOSIS — I671 Cerebral aneurysm, nonruptured: Secondary | ICD-10-CM

## 2018-04-02 ENCOUNTER — Ambulatory Visit: Payer: Medicare Other | Admitting: Acute Care

## 2018-04-02 ENCOUNTER — Encounter: Payer: Self-pay | Admitting: Acute Care

## 2018-04-02 ENCOUNTER — Ambulatory Visit (INDEPENDENT_AMBULATORY_CARE_PROVIDER_SITE_OTHER)
Admission: RE | Admit: 2018-04-02 | Discharge: 2018-04-02 | Disposition: A | Discharge status: Home / Self Care | Payer: Medicare Other | Source: Ambulatory Visit | Attending: Acute Care | Admitting: Acute Care

## 2018-04-02 VITALS — BP 120/64 | HR 64 | Ht 70.0 in | Wt 134.0 lb

## 2018-04-02 DIAGNOSIS — J90 Pleural effusion, not elsewhere classified: Secondary | ICD-10-CM

## 2018-04-02 DIAGNOSIS — J9601 Acute respiratory failure with hypoxia: Secondary | ICD-10-CM | POA: Diagnosis not present

## 2018-04-02 DIAGNOSIS — I5023 Acute on chronic systolic (congestive) heart failure: Secondary | ICD-10-CM | POA: Diagnosis not present

## 2018-04-02 DIAGNOSIS — J441 Chronic obstructive pulmonary disease with (acute) exacerbation: Secondary | ICD-10-CM

## 2018-04-02 LAB — NITRIC OXIDE: Nitric Oxide: 25

## 2018-04-02 NOTE — Patient Instructions (Addendum)
It is good to meet you for now. FENO today CXR today. We will call you with results We will renew the  Budesomide nebs. Continue these twice daily. Rinse mouth after use Continue Spiriva as you have been doing once daily. Increase activity slowly Continue Lasix daily as you have been doing. Consider a PET scan to evaluate the kidney since you cannot have an MRI>> ask Dr. Alroy Dust. Follow up with Dr. Marlou Porch cardiology.>> CHF Low salt diet is recommended. We will schedule pulmonary function tests. We will schedule you for a 6 minute walk. Follow up with Dr. Lamonte Sakai 04/10/2018 at 3 pm

## 2018-04-02 NOTE — Assessment & Plan Note (Addendum)
Continue Lasix as prescribed Follow-up with Dr. Marlou Porch, cardiology Follow-up with primary care physician for follow-up chemistries Low-sodium diet

## 2018-04-02 NOTE — Assessment & Plan Note (Addendum)
Resolved Patient was in the office today on room air with oxygen saturations of 97%

## 2018-04-02 NOTE — Progress Notes (Signed)
History of Present Illness [place] is a 77 y.o. male former smoker ( Quit 2004)  with CM , CAD, AAA with previous history of lung cancer and esophageal cancer s/p resection 07/2013,  admitted 3/30 with dyspnea /hypoxia found to have bilateral effusions, elevated BNP along with loculated right pleural effusion.He was seen by Dr. Vaughan Browner as an inpatient.  Hospital Course:  Patient was admitted and initially treated for bronchitis with steroids and nebulizer.  His BNP was 700 and EKG was showing left bundle branch block.  He had history of abdominal aortic aneurysm which appears to be stable his CT scan also suggested mediastinal thickening concerning for possible recurrence of patient's lung cancer all his age of cancer. He had bilateral pleural effusions.  Pulmonary as well as cardiac thoracic surgery were consulted initially.  He was evaluated and the conclusion is this is more than likely cardiovascular.  The finding is that of effusion in the lung fissure which gives the impression of an mass.  His EF was 30-35% in July 2015.  Has not been on diuretic.  Repeat echocardiogram shows his EF of 45-50%.  He was treated with diuresis.  Cardiac workup was completed including Doppler ultrasound of the slight extremity due to recurrent claudication.  His CT also showed left kidney enhancement concerning for recurrence of patient's cancer.  MRI was attempted however there was concern the patient may have had previous foreign body.  History is not readily available.  For safety reasons MRI was canceled.  This can be followed up by his PCP on reordered if deemed safe. With patient's known  systolic dysfunction he was maintained on lisinopril and Coreg.  Currently on low dose diuretics.  He appears to have COPD that has not previously been diagnosed on pulmonary has followed all.  We are sending him home on nebulizer treatments and Pulmicort.  He  will follow up with pulmonary in the outpatient setting.    Has  appointment with [facility] work up,04/10/2018 at 3 pm  04/02/2018 Hospital Follow up: Pt. Presents for follow up. He was admitted 03/08/2018-03/11/2018 with bilateral effusions and worsening systolic HF, after not taking diuretics. He was treated as noted above. He presents today stating he has been doing well. He states he is back to his baseline breathing status. He wants to increase his activity and mow his lawn using his riding mower. He is complaining of a hoarse voice and he wonders if this is secondary to his neck treatments.. He is using budesonide nebs twice daily.He did not use pulmicort nebs until he ran out of his budesonide.  He did note an improvement in his hoarseness once he started the Pulmicort nebs.  He has no swelling in his feet or legs.He does have a rescue inhaler. He has not had to use it since he has been home.  Both he and his wife are asking questions regarding continued use of Lasix.  I had conversations with him explaining that congestive heart failure was most likely the cause of the fluid in his lungs.  I explained that for this reason it is important for them to continue follow-up with cardiology.  There is a significant degree of medical illiteracy.  I spent time this afternoon teaching.. The patient denies fever, chest pain, orthopnea, or hemoptysis.  He is compliant with his nebulizer treatments daily.  He is anxious to increase his activity and resume some of his prehospitalization hobbies.  He has never been diagnosed with  COPD, and has a consult appointment with Dr. Lamonte Sakai for further evaluation of both that and his pulmonary artery hypertension noted on echo at most recent hospitalization.  We will do PFTs, and 6-minute walk for Dr. Agustina Caroli consult in May.  Consider changing budesonide nebs to combination inhaled corticosteroid and long-acting beta agonist inhaler as he may benefit from the inhaled corticosteroid.   Test Results: 04/02/2018>> FENO>> 25 ppb  04/02/2018>>  CXR>> pending  03/10/2018 Echo Left ventricle: The cavity size was normal. Wall thickness was   normal. Systolic function was mildly reduced. The estimated   ejection fraction was in the range of 45% to 50%. Features are   consistent with a pseudonormal left ventricular filling pattern,   with concomitant abnormal relaxation and increased filling   pressure (grade 2 diastolic dysfunction). - Mitral valve: There was mild to moderate regurgitation. - Left atrium: The atrium was moderately dilated. - Tricuspid valve: There was moderate regurgitation. - Pulmonary arteries: Systolic pressure was moderately increased.   PA peak pressure: 42 mm Hg (S).   03/08/2018 VQ Scan 03/09/2018  IMPRESSION: No suspicious segmental perfusion defects identified to suggest acute pulmonary embolus.  CBC Latest Ref Rng & Units 03/10/2018 03/08/2018 03/08/2018  WBC 4.0 - 10.5 K/uL 9.9 7.2 5.4  Hemoglobin 13.0 - 17.0 g/dL 11.5(L) 12.4(L) 12.7(L)  Hematocrit 39.0 - 52.0 % 35.5(L) 38.5(L) 39.8  Platelets 150 - 400 K/uL 200 192 199    BMP Latest Ref Rng & Units 03/10/2018 03/08/2018 03/08/2018  Glucose 65 - 99 mg/dL 114(H) 229(H) 92  BUN 6 - 20 mg/dL 25(H) 22(H) 21(H)  Creatinine 0.61 - 1.24 mg/dL 1.06 1.24 1.31(H)  Sodium 135 - 145 mmol/L 138 138 140  Potassium 3.5 - 5.1 mmol/L 4.0 4.1 4.3  Chloride 101 - 111 mmol/L 102 103 106  CO2 22 - 32 mmol/L 26 23 26   Calcium 8.9 - 10.3 mg/dL 8.8(L) 8.8(L) 9.1    BNP    Component Value Date/Time   BNP 733.9 (H) 03/08/2018 1743    ProBNP    Component Value Date/Time   PROBNP 10,512.0 (H) 07/30/2013 1253    PFT No results found for: FEV1PRE, FEV1POST, FVCPRE, FVCPOST, TLC, DLCOUNC, PREFEV1FVCRT, PSTFEV1FVCRT  Ct Head Wo Contrast  Result Date: 03/26/2018 CLINICAL DATA:  History of cerebral aneurysm. Evaluation of aneurysm clip. EXAM: CT HEAD WITHOUT CONTRAST TECHNIQUE: Contiguous axial images were obtained from the base of the skull through the vertex without  intravenous contrast. COMPARISON:  None. FINDINGS: Brain: Sequelae of prior left MCA aneurysm clipping are identified with mild left frontotemporal encephalomalacia. There is no evidence of acute infarct, intracranial hemorrhage, mass, midline shift, or extra-axial fluid collection. The ventricles are normal in size. Vascular: Calcified atherosclerosis at the skull base. Metallic left MCA aneurysm clip. Skull: Left temporal craniotomy. Sinuses/Orbits: Focal defect in the right lamina papyracea, possibly a remote medial orbital fracture. Bilateral cataract extraction. Mild right maxillary sinus mucosal thickening. Clear mastoid air cells. Other: None. IMPRESSION: 1. Prior left MCA aneurysm clipping. 2. No evidence of acute intracranial abnormality. Electronically Signed   By: Logan Bores M.D.   On: 03/26/2018 16:09   Nm Pulmonary Perf And Vent  Result Date: 03/09/2018 CLINICAL DATA:  Evaluate for pulmonary embolus. EXAM: NUCLEAR MEDICINE VENTILATION - PERFUSION LUNG SCAN TECHNIQUE: Ventilation images were obtained in multiple projections using inhaled aerosol Tc-25m DTPA. Perfusion images were obtained in multiple projections after intravenous injection of Tc-91m-MAA. RADIOPHARMACEUTICALS:  32.5 mCi of Tc-63m DTPA aerosol inhalation and 4.12 mCi  Tc70m-MAA IV COMPARISON:  Chest radiograph 03/08/2018 FINDINGS: Ventilation: There is a large defect within the right midlung which corresponds to the right loculated effusion in the right midlung. Peripheral nonsegmental decreased ventilation to the right upper lobe is identified. This corresponds with an area of peripheral pleural thickening or loculated pleural fluid on chest radiograph. Perfusion: There is a perfusion defect within the right midlung corresponding to the ventilation abnormality. No wedge-shaped/segmental perfusion defects identified to suggest acute pulmonary embolus. Nonsegmental decreased perfusion to the periphery of the right upper lobe  corresponds to the ventilation and chest radiograph abnormality. IMPRESSION: No suspicious segmental perfusion defects identified to suggest acute pulmonary embolus. Electronically Signed   By: Kerby Moors M.D.   On: 03/09/2018 11:19   Dg Chest Port 1 View  Result Date: 03/08/2018 CLINICAL DATA:  Shortness of breath. Right-sided lung cancer with surgery and radiation therapy. Pneumonia. Coronary artery disease. Aneurysm. EXAM: PORTABLE CHEST 1 VIEW COMPARISON:  Scout from the CT of 03/03/2018. FINDINGS: Midline trachea. Mild cardiomegaly. Prior median sternotomy. Small left pleural effusion is similar. Loculated right-sided pleural effusion is not significantly changed. Biapical pleuroparenchymal scarring. No pneumothorax. Basilar predominant interstitial thickening is moderate. No new pulmonary opacity. Scarring at the left lung base. IMPRESSION: Compared to the scout film of the CT of 03/03/2018, no gross interval change. Bilateral pleural effusions, including a loculated right-sided effusion. Chronic interstitial thickening suspicious for mild pulmonary venous congestion, superimposed upon emphysema. Electronically Signed   By: Abigail Miyamoto M.D.   On: 03/08/2018 18:05     Past medical hx Past Medical History:  Diagnosis Date  . AAA (abdominal aortic aneurysm) ([place]) 08/22/2012   3.9 cm by CT  - June 2013, stable   . 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 Atrial fibrillation ([place])   . CAD (coronary artery disease)   . Cancer ([place])    Esophageal, adrenal gland, skin; lung  . Carotid artery occlusion   . Cerebral aneurysm    [place]. repair  1994  . Dyslipidemia   . History of esophageal cancer    s/p transhiatal esophagogastrectomy  . History of lung cancer   . Hypothyroidism   . Pneumonia   . Postoperative atrial fibrillation ([place]) 12/04/2013   Short course of amiodarone, resolved. Postop bypass  . SBO (small bowel obstruction) ([place]) 08/15/2012   History of small bowel  obstruction (status post small bowel       resection, lysis of adhesions, incidental appendectomy, and repair       of left diaphragmatic hernia  . Stroke [facility]) 1995   denies residual     Social History   Tobacco Use  . Smoking status: Former Smoker    Types: Cigarettes    Last attempt to quit: 12/10/2002    Years since quitting: 15.3  . Smokeless tobacco: Never Used  Substance Use Topics  . Alcohol use: No  . Drug use: No    Mr.Dehaas reports that he quit smoking about 15 years ago. His smoking use included cigarettes. He has never used smokeless tobacco. He reports that he does not drink alcohol or use drugs.  Tobacco Cessation: Former smoker with a quit date of December 10, 2002.  There is no pack-year smoking history documented in epic  Past surgical hx, Family hx, Social hx all reviewed.  Current Outpatient Medications on File Prior to Visit  Medication Sig  . albuterol (PROVENTIL) (2.5 MG/3ML) 0.083% nebulizer solution Take 3 mLs (2.5 mg total) by  nebulization every 2 (two) hours as needed for wheezing.  Marland Kitchen aspirin 81 MG tablet Take 1 tablet (81 mg total) by mouth daily.  Marland Kitchen atorvastatin (LIPITOR) 40 MG tablet Take 1 tablet (40 mg total) by mouth daily at 6 PM.  . budesonide (PULMICORT) 0.25 MG/2ML nebulizer solution Take 2 mLs (0.25 mg total) by nebulization 2 (two) times daily.  . carvedilol (COREG) 12.5 MG tablet TAKE 1 TABLET (12.5 MG TOTAL) BY MOUTH 2 (TWO) TIMES DAILY  . diphenhydramine-acetaminophen (TYLENOL PM) 25-500 MG TABS tablet Take 2 tablets by mouth at bedtime as needed (for sleep better).  . furosemide (LASIX) 20 MG tablet Take 1 tablet (20 mg total) by mouth daily.  Marland Kitchen guaiFENesin (MUCINEX) 600 MG 12 hr tablet Take 1,200 mg by mouth 2 (two) times daily as needed for congestion.  Marland Kitchen ipratropium-albuterol (DUONEB) 0.5-2.5 (3) MG/3ML SOLN Take 3 mLs by nebulization every 4 (four) hours as needed.  Marland Kitchen lisinopril (PRINIVIL,ZESTRIL) 5 MG tablet TAKE 1 TABLET BY MOUTH EVERY  DAY (Patient taking differently: TAKE 1 TABLET (5 MG TOTALLY) BY MOUTH EVERY DAY)  . tiotropium (SPIRIVA) 18 MCG inhalation capsule Place 1 capsule (18 mcg total) into inhaler and inhale daily.   No current facility-administered medications on file prior to visit.      No Known Allergies  Review Of Systems:  Constitutional:   No  weight loss, night sweats,  Fevers, chills, + fatigue, or  lassitude.  HEENT:   No headaches,  Difficulty swallowing,  Tooth/dental problems, or  Sore throat,                No sneezing, itching, ear ache, nasal congestion, post nasal drip, throat hoarseness  CV:  No chest pain,  Orthopnea, PND, swelling in lower extremities, anasarca, dizziness, palpitations, syncope.   GI  No heartburn, indigestion, abdominal pain, nausea, vomiting, diarrhea, change in bowel habits, loss of appetite, bloody stools.   Resp: + shortness of breath with exertion less at rest.  No excess mucus, no productive cough,  No non-productive cough,  No coughing up of blood.  No change in color of mucus.  No wheezing.  No chest wall deformity  Skin: no rash or lesions.  GU: no dysuria, change in color of urine, no urgency or frequency.  No flank pain, no hematuria   MS:  No joint pain or swelling.  No decreased range of motion.  No back pain.  Psych:  No change in mood or affect. No depression or anxiety.  No memory loss.   Vital Signs BP 120/64 (BP Location: Left Arm, Cuff Size: Normal)   Pulse 64   Ht 5\' 10"  (1.778 m)   Wt 134 lb (60.8 kg)   SpO2 97%   BMI 19.23 kg/m    Physical Exam:  General- No distress,  A&Ox3, pleasant ENT: No sinus tenderness, TM clear, pale nasal mucosa, no oral exudate,no post nasal drip, no LAN Cardiac: S1, S2, regular rate and rhythm, no murmur Chest: No wheeze/ rales/ dullness; no accessory muscle use, no nasal flaring, no sternal retractions Abd.: Soft Non-tender, nondistended, flat Ext: No clubbing cyanosis, edema Neuro:  normal strength,  continues to recover from hospitalization, moving all extremities x4, alert and oriented x3, appropriate Skin: No rashes, warm and dry Psych: normal mood and behavior   Assessment/Plan  Acute respiratory failure with hypoxia [facility]) Resolved Patient was in the office today on room air with oxygen saturations of 97%  Pleural effusion Clinically patient appears better Chest  x-ray for evaluation of bilateral pleural effusions and loculated right-sided effusion is pending Plan FENO today CXR today. We will call you with results We will renew the  Budesomide nebs.  Until you are seen by Dr. Lamonte Sakai Continue these twice daily. Rinse mouth after use Continue Spiriva as you have been doing once daily. Increase activity slowly Continue Lasix daily as you have been doing. Consider a PET scan to evaluate the kidney since you cannot have an MRI>> ask Dr. Alroy Dust. Follow up with Dr. Marlou Porch cardiology.>> CHF Low salt diet is recommended. We will schedule pulmonary function tests. We will schedule you for a 6 minute walk. Follow up with Dr. Lamonte Sakai 04/10/2018 at 3 pm    Acute on chronic clinical systolic heart failure (HCC) Continue Lasix as prescribed Follow-up with Dr. Marlou Porch, cardiology Follow-up with primary care physician for follow-up chemistries Low-sodium diet  Suspected COPD Patient has never been formally diagnosed Plan We will renew the  Budesomide nebs. Continue these twice daily. Rinse mouth after use Continue Spiriva as you have been doing once daily. Increase activity slowly We will schedule pulmonary function tests. We will schedule you for a 6 minute walk. Follow up with Dr. Lamonte Sakai 04/10/2018 at 3 pm  Suspected PAH Elevated PA pressure per echo 02/26/2018 VQ scan negative Plan Continue Lasix as prescribed Follow-up with Dr. Lamonte Sakai 04/10/2018 for further evaluation    Magdalen Spatz, NP 04/02/2018  8:34 PM

## 2018-04-02 NOTE — Assessment & Plan Note (Signed)
Clinically patient appears better Chest x-ray for evaluation of bilateral pleural effusions and loculated right-sided effusion is pending Plan FENO today CXR today. We will call you with results We will renew the  Budesomide nebs.  Until you are seen by Dr. Lamonte Sakai Continue these twice daily. Rinse mouth after use Continue Spiriva as you have been doing once daily. Increase activity slowly Continue Lasix daily as you have been doing. Consider a PET scan to evaluate the kidney since you cannot have an MRI>> ask Dr. Alroy Dust. Follow up with Dr. Marlou Porch cardiology.>> CHF Low salt diet is recommended. We will schedule pulmonary function tests. We will schedule you for a 6 minute walk. Follow up with Dr. Lamonte Sakai 04/10/2018 at 3 pm

## 2018-04-03 ENCOUNTER — Telehealth: Payer: Self-pay | Admitting: Acute Care

## 2018-04-03 MED ORDER — BUDESONIDE 0.25 MG/2ML IN SUSP: 0.2500 mg | Freq: Two times a day (BID) | RESPIRATORY_TRACT | 11 refills | Status: DC

## 2018-04-03 NOTE — Telephone Encounter (Signed)
Spoke with pt's wife, pt's pulmicort was not called in to pharmacy yesterday.  This has been reordered.  Nothing further needed.

## 2018-04-04 ENCOUNTER — Ambulatory Visit (INDEPENDENT_AMBULATORY_CARE_PROVIDER_SITE_OTHER): Payer: Medicare Other | Admitting: Emergency Medicine

## 2018-04-04 DIAGNOSIS — J90 Pleural effusion, not elsewhere classified: Secondary | ICD-10-CM

## 2018-04-04 LAB — PULMONARY FUNCTION TEST
DL/VA % pred: 58 %
DL/VA: 2.71 ml/min/mmHg/L
DLCO unc % pred: 63 %
DLCO unc: 20.46 ml/min/mmHg
FEF 25-75 Post: 2.79 L/sec
FEF 25-75 Pre: 1.74 L/sec
FEF2575-%Change-Post: 60 %
FEF2575-%Pred-Post: 127 %
FEF2575-%Pred-Pre: 79 %
FEV1-%Change-Post: 18 %
FEV1-%Pred-Post: 80 %
FEV1-%Pred-Pre: 67 %
FEV1-Post: 2.44 L
FEV1-Pre: 2.06 L
FEV1FVC-%Change-Post: 19 %
FEV1FVC-%Pred-Pre: 103 %
FEV6-%Change-Post: 0 %
FEV6-%Pred-Post: 69 %
FEV6-%Pred-Pre: 68 %
FEV6-Post: 2.73 L
FEV6-Pre: 2.7 L
FEV6FVC-%Pred-Post: 106 %
FEV6FVC-%Pred-Pre: 106 %
FVC-%Change-Post: 0 %
FVC-%Pred-Post: 65 %
FVC-%Pred-Pre: 65 %
FVC-Post: 2.74 L
FVC-Pre: 2.76 L
Post FEV1/FVC ratio: 89 %
Post FEV6/FVC ratio: 100 %
Pre FEV1/FVC ratio: 75 %
Pre FEV6/FVC Ratio: 100 %
RV % pred: 64 %
RV: 1.66 L
TLC % pred: 66 %
TLC: 4.69 L

## 2018-04-04 NOTE — Progress Notes (Signed)
PFT completed 04/04/18

## 2018-04-10 ENCOUNTER — Ambulatory Visit: Payer: Medicare Other | Admitting: Emergency Medicine

## 2018-04-10 ENCOUNTER — Ambulatory Visit (INDEPENDENT_AMBULATORY_CARE_PROVIDER_SITE_OTHER): Payer: Medicare Other | Admitting: *Deleted

## 2018-04-10 ENCOUNTER — Encounter: Payer: Self-pay | Admitting: Emergency Medicine

## 2018-04-10 DIAGNOSIS — J90 Pleural effusion, not elsewhere classified: Secondary | ICD-10-CM

## 2018-04-10 DIAGNOSIS — I2721 Secondary pulmonary arterial hypertension: Secondary | ICD-10-CM | POA: Diagnosis not present

## 2018-04-10 DIAGNOSIS — J431 Panlobular emphysema: Secondary | ICD-10-CM | POA: Diagnosis not present

## 2018-04-10 DIAGNOSIS — Z85118 Personal history of other malignant neoplasm of bronchus and lung: Secondary | ICD-10-CM | POA: Diagnosis not present

## 2018-04-10 MED ORDER — TIOTROPIUM BROMIDE-OLODATEROL 2.5-2.5 MCG/ACT IN AERS: 2.0000 | INHALATION_SPRAY | Freq: Every day | RESPIRATORY_TRACT | 0 refills | Status: DC

## 2018-04-10 NOTE — Assessment & Plan Note (Signed)
By CT scan with obstruction confirmed on pulmonary function testing from 04/04/2018.  He is confused about his medical regimen.  He does not exacerbate frequently and is not clear to me that he needs to be on inhaled steroid.  He would benefit from the addition of a LABA to his anticholinergic.  I believe I can simplify things significantly by stopping his nebulized meds, changing his Spiriva to [employer].  He can keep albuterol HFA available to use if needed for symptom management.

## 2018-04-10 NOTE — Assessment & Plan Note (Signed)
Improved with diuresis. Note R pseudotumor on imaging from 02/2018.

## 2018-04-10 NOTE — Progress Notes (Signed)
Patient seen in the office today and instructed on use of Stiolto Respimat.  Patient expressed understanding and demonstrated technique.

## 2018-04-10 NOTE — Assessment & Plan Note (Signed)
Due to his left-sided heart disease (LV dysfunction, atrial fibrillation) and also to significant parenchymal lung disease with emphysema.  Most important thing is to avoid hypoxemia and control his underlying contributors.  He is on a good diuretic regimen.  He did not desaturate with ambulation today.  No indication for exertional oxygen currently.  We will try to improve bronchodilator therapy

## 2018-04-10 NOTE — Patient Instructions (Addendum)
Please STOP all of your nebulized medications, including budesonide (Pulmicort), albuterol, and DuoNeb (albuterol/ipratropium).  We will not use any nebulized medications for now.  We may decide to restart at some point depending on how you do. Please stop Spiriva (tiotropium). This medication will be replaced.  START Stiolto 2 puffs once a day.  Take this every day on a schedule at the same time. Please continue your albuterol inhaler if needed for shortness of breath or wheezing.  You can take 2 puffs up to every 4 hours if you develop acute symptoms.  Take this with you wherever you go in case your breathing changes. Your walking test today shows that you do not need to wear oxygen It is OK for you to go back to playing golf. Just restart slowly and work up as [place] improves.  Follow with Dr Lamonte Sakai in 4 months or sooner if you have any problems.

## 2018-04-10 NOTE — Progress Notes (Signed)
Subjective:    Patient ID: [place], male    DOB: 1941/01/25, 77 y.o.   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   He was hospitalized 3/30 to 03/11/2018 for bilateral pleural effusions in the setting of systolic CHF while off of his diuretics.  He has CT scan of his chest on 03/03/2018 that shows severe emphysematous change, loculated right pleural effusion in the right major fissure (pseudotumor) heterogeneous basilar opacities. A TTE 03/10/18 shows some improvement in LVEF at 45%, evidence for secondary PAH, PASP 42 mmHg.  He underwent pulmonary function testing 04/04/2018 that I have reviewed.  The spirometry shows moderately severe mixed restriction and obstruction.  The obstruction is confirmed by positive bronchodilator response.  His FEV1 is 67% predicted.  Lung volumes confirm restriction and his diffusion capacity is decreased and does not correct when adjusted for his alveolar volume. He is on Spiriva and budesonide nebs bid. Duoneb and albuterol nebs are on his med list but not clear that he takes this.  He and his wife are quite confused about her medications, their names, etc, but after a lot of questioning I do believe that he is actually taking the budesonide and the Spiriva as directed.  6-minute walk was done today, walk distance 384 m with no evidence of desaturation.  He is not yet back to playing golf. He mowed the grass 5/1 on a riding mower. Denies much cough    Review of Systems  Constitutional: Negative for fever and unexpected weight change.  HENT: Negative for congestion, dental problem, ear pain, nosebleeds, postnasal drip, rhinorrhea, sinus pressure, sneezing, sore throat and trouble swallowing.   Eyes: Negative  for redness and itching.  Respiratory: Positive for shortness of breath. Negative for cough, chest tightness and wheezing.   Cardiovascular: Negative for palpitations and leg swelling.  Gastrointestinal: Negative for nausea and vomiting.  Genitourinary: Negative for dysuria.  Musculoskeletal: Negative for joint swelling.  Skin: Negative for rash.  Neurological: Negative for headaches.  Hematological: Does not bruise/bleed easily.  Psychiatric/Behavioral: Negative for dysphoric mood. The patient is not nervous/anxious.        Objective:   Physical Exam Vitals:   04/10/18 1439  BP: 116/80  Pulse: 65  SpO2: 99%  Weight: 135 lb (61.2 kg)   Gen: Pleasant, thin man, in no distress,  normal affect  ENT: No lesions,  mouth clear,  oropharynx clear, no postnasal drip, hearing aides in place  Neck: No JVD, no stridor  Lungs: No use of accessory muscles, no dullness to percussion, clear without rales or rhonchi  Cardiovascular: RRR, heart sounds normal, no murmur or gallops, no peripheral edema  Musculoskeletal: No deformities, no cyanosis or clubbing  Neuro: awake, alert, poor historians but redirectable. No focal deficits  Skin: Warm, no lesions or rash      Assessment & Plan:  Pleural effusion Improved with diuresis. Note R pseudotumor on imaging from 02/2018.   Pulmonary emphysema ([place]) By CT scan with obstruction confirmed on pulmonary function testing from 04/04/2018.  He is confused about his medical regimen.  He does not exacerbate frequently and is not clear to me that he needs to be on inhaled steroid.  He would benefit from the addition of a LABA to his anticholinergic.  I believe  I can simplify things significantly by stopping his nebulized meds, changing his Spiriva to [employer].  He can keep albuterol HFA available to use if needed for symptom management.  History of lung cancer Status post left upper lobe lobectomy without any evidence of local or distant  recurrence.  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HCC) Due to his left-sided heart disease (LV dysfunction, atrial fibrillation) and also to significant parenchymal lung disease with emphysema.  Most important thing is to avoid hypoxemia and control his underlying contributors.  He is on a good diuretic regimen.  He did not desaturate with ambulation today.  No indication for exertional oxygen currently.  We will try to improve bronchodilator therapy  Baltazar Apo, MD, PhD 04/10/2018, 3:30 PM [facility] [phone] or if no answer [phone]

## 2018-04-10 NOTE — Assessment & Plan Note (Signed)
Status post left upper lobe lobectomy without any evidence of local or distant recurrence.

## 2018-04-10 NOTE — Progress Notes (Signed)
SIX MIN WALK 04/10/2018  Medications carvedilol 12.5mg  and furosemide 20mg  were taken this AM around 9am  Supplimental Oxygen during Test? (L/min) No  Laps 8  Partial Lap (in Meters) 0  Baseline BP (sitting) 110/70  Baseline Heartrate 65  Baseline Dyspnea (Borg Scale) 1  Baseline Fatigue (Borg Scale) 1  Baseline SPO2 99  BP (sitting) 120/82  Heartrate 78  Dyspnea (Borg Scale) 1  Fatigue (Borg Scale) 1  SPO2 95  BP (sitting) 116/80  Heartrate 65  SPO2 99  Stopped or Paused before Six Minutes No  Interpretation (No Data)  Distance Completed 384  Tech Comments: Patient was able to complete the entire 76mw without stopping. Denied any chest pain of SOB. Did state that his legs and calves were sore but for him, this is normal. He was able to walk at a brisk pace. O2 was not needed during walk.

## 2018-04-14 ENCOUNTER — Telehealth: Payer: Self-pay | Admitting: Emergency Medicine

## 2018-04-14 NOTE — Telephone Encounter (Signed)
Called and spoke to pt's wife, Silva Bandy. She is questioning if pt should blow out the breath when using his Stiolto inhaler or swallow the air. Karren Burly to have pt take breath in, hold for 5 seconds, then blow breath out. She repeated this back to me. Nothing further needed at this time. Will sign off.

## 2018-04-21 ENCOUNTER — Other Ambulatory Visit: Payer: Self-pay

## 2018-04-21 ENCOUNTER — Encounter: Payer: Self-pay | Admitting: Surgery

## 2018-04-21 ENCOUNTER — Telehealth: Payer: Self-pay | Admitting: Cardiology

## 2018-04-21 ENCOUNTER — Ambulatory Visit (INDEPENDENT_AMBULATORY_CARE_PROVIDER_SITE_OTHER): Payer: Medicare Other | Admitting: Surgery

## 2018-04-21 ENCOUNTER — Ambulatory Visit (HOSPITAL_COMMUNITY)
Admission: RE | Admit: 2018-04-21 | Discharge: 2018-04-21 | Disposition: A | Discharge status: Home / Self Care | Payer: Medicare Other | Source: Ambulatory Visit | Attending: Surgery | Admitting: Surgery

## 2018-04-21 VITALS — BP 90/61 | HR 63 | Temp 97.0°F | Resp 16 | Ht 70.0 in | Wt 137.4 lb

## 2018-04-21 DIAGNOSIS — I6523 Occlusion and stenosis of bilateral carotid arteries: Secondary | ICD-10-CM

## 2018-04-21 DIAGNOSIS — I714 Abdominal aortic aneurysm, without rupture, unspecified: Secondary | ICD-10-CM

## 2018-04-21 NOTE — Progress Notes (Signed)
[facility] [place]  Patient name: Glen Green MRN: [id number] DOB: [birth year] Sex: male   REASON FOR VISIT:    Follow up  HISOTRY OF PRESENT ILLNESS:    Glen Green is a 77 y.o. male who has been under surveillance for an AAA for many years  The patient has a history of an ischemic heart disease.  He presented in August of 2014 with shock and heart failure and was found to have an occluded LAD and circumflex.  He underwent emergent cardiac bypass surgery.  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  He has a history of esophageal cancer, status post distal esophagectomy and 2004, followed by chemotherapy.  He is also status post right adrenalectomy and left upper lobectomy in 2011, presumably for metastatic disease.  He suffers from hypercholesterolemia, treated with a statin.  He has a history of smoking but quit in 2004.  He also has a history of a ruptured cerebral aneurysm.  The patient does complain of cramping in his legs with walking.  He is a diabetic.  Most recent A1c is 6.9  PAST MEDICAL HISTORY:   Past Medical History:  Diagnosis Date  . AAA (abdominal aortic aneurysm) ([place]) 08/22/2012   3.9 cm by CT  - June 2013, stable   . 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 Atrial fibrillation ([place])   . CAD (coronary artery disease)   . Cancer ([place])    Esophageal, adrenal gland, skin; lung  . Carotid artery occlusion   . Cerebral aneurysm    TX. repair  1994  . Dyslipidemia   . History of esophageal cancer    s/p transhiatal esophagogastrectomy  . History of lung cancer   . Hypothyroidism   . Pneumonia   . Postoperative atrial fibrillation ([place]) 12/04/2013   Short course of amiodarone, resolved. Postop bypass  . SBO (small bowel obstruction) ([place]) 08/15/2012   History of small bowel obstruction (status post small bowel       resection, lysis of adhesions, incidental appendectomy, and repair        of left diaphragmatic hernia  . Stroke [facility]) 1995   denies residual     FAMILY HISTORY:   Family History  Problem Relation Age of Onset  . Aneurysm Mother   . Kidney disease Father   . Heart disease Maternal Uncle   . Heart disease Paternal Uncle   . Aneurysm Maternal Grandmother   . Diabetes Sister     SOCIAL HISTORY:   Social History   Tobacco Use  . Smoking status: Former Smoker    Types: Cigarettes    Last attempt to quit: 12/10/2002    Years since quitting: 15.3  . Smokeless tobacco: Never Used  Substance Use Topics  . Alcohol use: No     ALLERGIES:   No Known Allergies   CURRENT MEDICATIONS:   Current Outpatient Medications  Medication Sig Dispense Refill  . aspirin 81 MG tablet Take 1 tablet (81 mg total) by mouth daily. 30 tablet 0  . atorvastatin (LIPITOR) 40 MG tablet Take 1 tablet (40 mg total) by mouth daily at 6 PM. 90 tablet 2  . carvedilol (COREG) 12.5 MG tablet TAKE 1 TABLET (12.5 MG TOTAL) BY MOUTH 2 (TWO) TIMES DAILY 180 tablet 3  . diphenhydramine-acetaminophen (TYLENOL PM) 25-500 MG TABS tablet Take 2 tablets by mouth at bedtime as needed (for sleep better).    . furosemide (LASIX) 20 MG tablet Take 1  tablet (20 mg total) by mouth daily. 30 tablet 11  . guaiFENesin (MUCINEX) 600 MG 12 hr tablet Take 1,200 mg by mouth 2 (two) times daily as needed for congestion.    Marland Kitchen lisinopril (PRINIVIL,ZESTRIL) 5 MG tablet TAKE 1 TABLET BY MOUTH EVERY DAY (Patient taking differently: TAKE 1 TABLET (5 MG TOTALLY) BY MOUTH EVERY DAY) 90 tablet 3  . Tiotropium Bromide-Olodaterol (STIOLTO RESPIMAT) 2.5-2.5 MCG/ACT AERS Inhale 2 puffs into the lungs daily. 2 Inhaler 0   No current facility-administered medications for this visit.     REVIEW OF SYSTEMS:   [X]  denotes positive finding, [ ]  denotes negative finding Cardiac  Comments:  Chest pain or chest pressure:    Shortness of breath upon exertion: x   Short of breath when lying flat:    Irregular heart  rhythm:        Vascular    Pain in calf, thigh, or hip brought on by ambulation: x   Pain in feet at night that wakes you up from your sleep:     Blood clot in your veins:    Leg swelling:  x       Pulmonary    Oxygen at home:    Productive cough:     Wheezing:         Neurologic    Sudden weakness in arms or legs:     Sudden numbness in arms or legs:     Sudden onset of difficulty speaking or slurred speech:    Temporary loss of vision in one eye:     Problems with dizziness:         Gastrointestinal    Blood in stool:     Vomited blood:         Genitourinary    Burning when urinating:     Blood in urine:        Psychiatric    Major depression:         Hematologic    Bleeding problems:    Problems with blood clotting too easily:        Skin    Rashes or ulcers:        Constitutional    Fever or chills:      PHYSICAL EXAM:   There were no vitals filed for this visit.  GENERAL: The patient is a well-nourished male, in no acute distress. The vital signs are documented above. CARDIAC: There is a regular rate and rhythm.  VASCULAR: non palpable pedal pulses PULMONARY: Non-labored respirations ABDOMEN: Soft and non-tender.  Pulsatile abdominal mass MUSCULOSKELETAL: There are no major deformities or cyanosis. NEUROLOGIC: No focal weakness or paresthesias are detected. SKIN: There are no ulcers or rashes noted. PSYCHIATRIC: The patient has a normal affect.  STUDIES:   I have ordered and reviewed his vascular lab studies wit the following results: +-------+-----------+-----------+------------+------------+ ABI/TBIToday's ABIToday's TBIPrevious ABIPrevious TBI +-------+-----------+-----------+------------+------------+ Right 0.7    0.55                 +-------+-----------+-----------+------------+------------+ Left  0.85    0.61                  +-------+-----------+-----------+------------+------------+ Right Toe pressure = 58 Left toe pressure = 64  CTA: Infrarenal abdominal aortic aneurysm is present. Maximal diameter is 5.0 cm.  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 Branches reconstitute. This is advantageous for stent graft placement.  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  Status post CABG.  Carotid duplex: Right 60-79% stenosis.  Left 1-39% stenosis  MEDICAL ISSUES:   AAA: We discussed that his aneurysm has increased to the point where it now measures 5 cm in maximum diameter.  We discussed proceeding with endovascular repair.  I discussed the risks and benefits of the operation including the risk of renal insufficiency, colon Ischemia, arterial injury, bleeding.  He is scheduled to see cardiology within the month.  I will make sure we get their approval prior to proceeding.  Carotid stenosis: He will need surveillance ultrasound in 6 months  Claudication:  Continue with exercise and medical therapy  Annamarie Major, MD [facility] [phone] Pager [phone]

## 2018-04-21 NOTE — Telephone Encounter (Signed)
° °  [facility] Pre-operative Risk Assessment    Request for surgical clearance:  1. What type of surgery is being performed? EVAR   2. When is this surgery scheduled? Pending   3. What type of clearance is required (medical clearance vs. Pharmacy clearance to hold med vs. Both)? Medical Clearance  4. Are there any medications that need to be held prior to surgery and how long?no   5. Practice name and name of physician performing surgery? Dr Harold Barban  6.  What is your fax number-[phone]  What is your office phone number -[phone]  8.   Anesthesia type (None, local, MAC, general) ? Did not know    Glen Green 04/21/2018, 11:58 AM  _________________________________________________________________   (provider comments below)

## 2018-04-21 NOTE — Telephone Encounter (Signed)
-----   Message from Willy Eddy, RN sent at 04/21/2018  3:27 PM EDT ----- Regarding: Cardiac Clearance Requesting Cardiac Clearance for patient. EVAR with Dr. Trula Slade pending. Thank you for your time. Becky RN

## 2018-04-23 ENCOUNTER — Other Ambulatory Visit: Payer: Self-pay | Admitting: *Deleted

## 2018-04-23 NOTE — Telephone Encounter (Signed)
I spoke with Ms Diebold. The pt was admitted in April with bilateral pleural effusions that improved with diuresis. He is followed by Dr Lamonte Sakai (lung cancer, emphysema, secondary pulmonary HTN). He has an appointment to see Dr Marlou Porch in June but the patient wants to see if we can move this up so he can be cleared for EVAR. I will get the patient scheduled to see an APP at [facility] (the pt's wife understands they will not see Dr Marlou Porch but that the APP can facilitate clearance).   Kerin Ransom PA-C 04/23/2018 1:24 PM

## 2018-04-23 NOTE — Telephone Encounter (Signed)
Left message TCB.  Kerin Ransom PA-C 04/23/2018 10:01 AM

## 2018-04-23 NOTE — Telephone Encounter (Signed)
Spoke with Pt's wife, appointment made for 04/29/18 with Truitt Merle, NP at 2 pm for pre-op clearance.

## 2018-04-25 ENCOUNTER — Encounter: Payer: Self-pay | Admitting: Cardiology

## 2018-04-28 NOTE — Progress Notes (Signed)
CARDIOLOGY OFFICE NOTE  Date:  04/29/2018    Glen Green Date of Birth: 01/09/1941 Medical Record #[id number]  PCP:  Glen Green, L.Glen Sa, MD  Cardiologist:  Glen Green    Chief Complaint  Patient presents with  . Pre-op Exam    Seen for Glen Green.     History of Present Illness: Glen Green is a 77 y.o. male who presents today for a pre op clearance visit. Seen for Glen Green.   He has a history of CAD status post acute MI with associated shock and emergent CABG x 2 in 2014, ischemic cardiomyopathy with originally at 25% but last measuring at 30-35% EF, post of AF requiring brief use of amiodarone. He has had chronic hypotension. The record notes that he has declined ICD implant after prior consultation with Glen Green in the past.   Other issues include stage 4 esophageal cancer with esophagectomy and chemo in 2004. He has had right adrenalectomy and left upper lobectomy in 2011 - presumably for metastatic disease. He has had HLD - on statin. He has had an enlarging abdominal aneurysm. He has had coiling for a cerebral aneurysm - he CANNOT have an MRI apparently.   Last seen by Glen Green back in January - felt to be doing ok. Playing golf - pacing himself. No cardiac complaints.   Admitted back in April with bilateral pleural effusions - unclear to me what was the trigger - he was diuresed. The record notes that he was seen during this admission by pulmonary and CT surgery. The record notes that a CT scan suggested mediastinal thickening concerning for possible recurrence of patient's lung cancer.? Of malignant effusion. Noted that it was their conclusion that this is more than likely cardiovascular. The finding is that of effusion in the lung fissure which gives the impression of an mass. Repeat echocardiogram shows his EF of 45-50%. He was treated with diuresis. There was no thoracentesis. His CT also showed left kidney enhancement concerning for recurrence of patient's cancer.  MRI was attempted however there was concern the patient may have had previous foreign body. History was not readily available. For safety reasons the MRI was canceled. This was to be followed as an outpatient.   Now needing clearance for EVAR with Glen Green - his abdominal aneurysm is now at 5cm.   Green in today. Here with his wife. They seem very confused about what has been told to them. 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 She also tells me that Glen Green is going to do some scan later in August. I do not have those records.   PET-CT was recommended due to the abnormalities. I do not see where CT surgery formally saw Glen Green during his last admission. He has not been seen by Glen Green since August of 2017. Glen Green feels well. No chest pain. Breathing is ok. BP chronically low. No syncope. Paces himself.    According to the Revised Cardiac Risk Index (RCRI), his Perioperative Risk of Major Cardiac Event is (%): 11  His Functional Capacity in METs is: 4.64 according to the [facility] Activity Status Index (DASI).  Past Medical History:  Diagnosis Date  . AAA (abdominal aortic aneurysm) ([place]) 08/22/2012   3.9 cm by CT  - June 2013, stable   . 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 Atrial fibrillation ([place])   . CAD (coronary artery disease)   .  Cancer ([place])    Esophageal, adrenal gland, skin; lung  . Carotid artery occlusion   . Cerebral aneurysm    TX. repair  1994  . Dyslipidemia   . History of esophageal cancer    s/p transhiatal esophagogastrectomy  . History of lung cancer   . Hypothyroidism   . Pneumonia   . Postoperative atrial fibrillation ([place]) 12/04/2013   Short course of amiodarone, resolved. Postop bypass  . SBO (small bowel obstruction) ([place]) 08/15/2012   History of small bowel obstruction (status post small bowel       resection, lysis of adhesions, incidental appendectomy, and repair       of left  diaphragmatic hernia  . Stroke [facility] [place]) 1995   denies residual    Past Surgical History:  Procedure Laterality Date  . BRAIN SURGERY     brain aneursyn  . CHOLECYSTECTOMY    . CORONARY ARTERY BYPASS GRAFT N/A 07/30/2013   Procedure: CORONARY ARTERY BYPASS GRAFTING (CABG) times two on pump using left internal mammary artery and left greater saphenous vein via endovein harvest.;  Surgeon: Glen Pollack, MD;  Location: [facility] OR;  Service: Open Heart Surgery;  Laterality: N/A;  . Exploratory laparotomy, lysis of adhesions, reduce of incarcerated small bowel and colon from the chest, limited small bowel resection, incidental appendectomy, and then repair of diaphragmatic hernia with AlloDerm mesh  10/27/2009   [place]  . Exploratory laparotomy,exploratory thoracotomy for hemorrhage  02/11/2003   [place]  . LEFT HEART CATHETERIZATION WITH CORONARY ANGIOGRAM N/A 07/30/2013   Procedure: LEFT HEART CATHETERIZATION WITH CORONARY ANGIOGRAM;  Surgeon: Glen M Martinique, MD;  Location: [facility] CATH LAB;  Service: Cardiovascular;  Laterality: N/A;  . Left subclavian Port- A-Cath insertion  03/09/2004   [place]  . left upper lobectomy with node dissection  02/24/2010   [place]  . TONSILLECTOMY    . Transhiatal esophagectomy with cholecystectomy, jejunostomy,  pyloroplasty and removal of right subclavian Port-A- Cath     [place]     Medications: Current Meds  Medication Sig  . aspirin 81 MG tablet Take 1 tablet (81 mg total) by mouth daily.  Marland Kitchen atorvastatin (LIPITOR) 40 MG tablet Take 1 tablet (40 mg total) by mouth daily at 6 PM.  . carvedilol (COREG) 12.5 MG tablet TAKE 1 TABLET (12.5 MG TOTAL) BY MOUTH 2 (TWO) TIMES DAILY  . diphenhydramine-acetaminophen (TYLENOL PM) 25-500 MG TABS tablet Take 2 tablets by mouth at bedtime as needed (for sleep better).  . furosemide (LASIX) 20 MG tablet Take 1 tablet (20 mg total) by mouth daily.  Marland Kitchen guaiFENesin (MUCINEX) 600 MG 12 hr tablet Take 1,200 mg by mouth 2 (two) times  daily as needed for congestion.  Marland Kitchen lisinopril (PRINIVIL,ZESTRIL) 5 MG tablet TAKE 1 TABLET BY MOUTH EVERY DAY (Patient taking differently: TAKE 1 TABLET (5 MG TOTALLY) BY MOUTH EVERY DAY)  . Tiotropium Bromide-Olodaterol (STIOLTO RESPIMAT) 2.5-2.5 MCG/ACT AERS Inhale 2 puffs into the lungs daily.     Allergies: No Known Allergies  Social History: The patient  reports that he quit smoking about 15 years ago. His smoking use included cigarettes. He has never used smokeless tobacco. He reports that he does not drink alcohol or use drugs.   Family History: The patient's family history includes Aneurysm in his maternal grandmother and mother; Diabetes in his sister; Heart disease in his maternal uncle and paternal uncle; Kidney disease in his father.   Review of Systems: Please see the history of present illness.   Otherwise, the  review of systems is positive for none.   All other systems are reviewed and negative.   Physical Exam: VS:  BP 98/60 (BP Location: Left Arm, Patient Position: Sitting, Cuff Size: Normal)   Pulse 60   Ht 5\' 10"  (1.778 m)   Wt 138 lb 12.8 oz (63 kg)   BMI 19.92 kg/m  .  BMI Body mass index is 19.92 kg/m.  Wt Readings from Last 3 Encounters:  04/29/18 138 lb 12.8 oz (63 kg)  04/21/18 137 lb 6.4 oz (62.3 kg)  04/10/18 135 lb (61.2 kg)    General: Pleasant. Quite thin. Alert and in no acute distress.   HEENT: Normal.  Neck: Supple, no JVD, carotid bruits, or masses noted.  Cardiac: Regular rate and rhythm. Systolic murmur noted. No edema.  Respiratory:  Lungs are fairly clear to auscultation bilaterally with normal work of breathing.  GI: Soft and nontender.  MS: No deformity or atrophy. Gait and ROM intact.  Skin: Warm and dry. Color is normal.  Neuro:  Strength and sensation are intact and no gross focal deficits noted.  Psych: Alert, appropriate and with normal affect.   LABORATORY DATA:  EKG:  EKG is ordered today. This demonstrates NSR with 1st  degree AV block & LBBB.   Lab Results  Component Value Date   WBC 9.9 03/10/2018   HGB 11.5 (L) 03/10/2018   HCT 35.5 (L) 03/10/2018   PLT 200 03/10/2018   GLUCOSE 114 (H) 03/10/2018   CHOL 92 07/31/2013   TRIG 73 07/31/2013   HDL 35 (L) 07/31/2013   LDLCALC 42 07/31/2013   ALT 13 (L) 03/10/2018   AST 19 03/10/2018   NA 138 03/10/2018   K 4.0 03/10/2018   CL 102 03/10/2018   CREATININE 1.06 03/10/2018   BUN 25 (H) 03/10/2018   CO2 26 03/10/2018   TSH 1.766 07/31/2013   PSA 1.75 08/22/2012   INR 1.52 (H) 07/31/2013   HGBA1C 6.5 (H) 07/30/2013        BNP (last 3 results) Recent Labs    03/08/18 1743  BNP 733.9*    ProBNP (last 3 results) No results for input(s): PROBNP in the last 8760 hours.   Other Studies Reviewed Today:  Echo Study Conclusions April 2019  - Left ventricle: The cavity size was normal. Wall thickness was   normal. Systolic function was mildly reduced. The estimated   ejection fraction was in the range of 45% to 50%. Features are   consistent with a pseudonormal left ventricular filling pattern,   with concomitant abnormal relaxation and increased filling   pressure (grade 2 diastolic dysfunction). - Mitral valve: There was mild to moderate regurgitation. - Left atrium: The atrium was moderately dilated. - Tricuspid valve: There was moderate regurgitation. - Pulmonary arteries: Systolic pressure was moderately increased.   PA peak pressure: 42 mm Hg (S).   Assessment/Plan:  1. Pre op clearance - he is at increased risk given his co-morbidities. He is currently without symptoms. Would advise Lexiscan Myoview.   2. CAD - prior emergent CABG in the setting of MI and shock.   3. Esophageal/lung cancer with adrenal mets - most recent CT concerning. PET-CT recommended. I have called and had Dr. Servando Snare review the CT - he agrees that PET-CT is indicated.   4. History of post op AF - no recurrence  5. HLD - on statin therapy  6.  Ischemic CM - EF now at 45 to 50%.   7. Recent admission  for shortness of breath/bilateral pleural effusions - unclear etiology to me.   8. 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 Enlarging AAA - I have spoken to Glen Green this afternoon by phone - his plans for Mr. Sellman have changed and Mr. Schweer will need a specifically made/more involved graft - done out of [place] - and will need a finer cut CT scan. At this time, we have agreed to proceed with PET- CT, if ok, then [place] and then repeat CT with Glen Green. It is our feeling that if the PET-CT is abnormal, we would probably abort the plans for stress testing/aneurysm repair.   Current medicines are reviewed with the patient today.  The patient does not have concerns regarding medicines other than what has been noted above.  The following changes have been made:  See above.  Labs/ tests ordered today include:    Orders Placed This Encounter  Procedures  . EKG 12-Lead     Disposition:   As noted above.   Patient is agreeable to this plan and will call if any problems develop in the interim.   SignedTruitt Merle, NP  04/29/2018 2:41 PM  [place] [facility] [address] [place], [place]  [address] Phone: [phone] Fax: [phone]

## 2018-04-29 ENCOUNTER — Telehealth: Payer: Self-pay | Admitting: *Deleted

## 2018-04-29 ENCOUNTER — Encounter: Payer: Self-pay | Admitting: Nurse Practitioner

## 2018-04-29 ENCOUNTER — Ambulatory Visit: Payer: Medicare Other | Admitting: Nurse Practitioner

## 2018-04-29 ENCOUNTER — Other Ambulatory Visit: Payer: Self-pay | Admitting: *Deleted

## 2018-04-29 VITALS — BP 98/60 | HR 60 | Ht 70.0 in | Wt 138.8 lb

## 2018-04-29 DIAGNOSIS — I714 Abdominal aortic aneurysm, without rupture, unspecified: Secondary | ICD-10-CM

## 2018-04-29 DIAGNOSIS — R9389 Abnormal findings on diagnostic imaging of other specified body structures: Secondary | ICD-10-CM | POA: Diagnosis not present

## 2018-04-29 DIAGNOSIS — R935 Abnormal findings on diagnostic imaging of other abdominal regions, including retroperitoneum: Secondary | ICD-10-CM

## 2018-04-29 DIAGNOSIS — I5022 Chronic systolic (congestive) heart failure: Secondary | ICD-10-CM

## 2018-04-29 DIAGNOSIS — Z01810 Encounter for preprocedural cardiovascular examination: Secondary | ICD-10-CM | POA: Diagnosis not present

## 2018-04-29 DIAGNOSIS — I255 Ischemic cardiomyopathy: Secondary | ICD-10-CM | POA: Diagnosis not present

## 2018-04-29 NOTE — Patient Instructions (Signed)
We will be checking the following labs today - NONE   Medication Instructions:    Continue with your current medicines.     Testing/Procedures To Be Arranged after I talk with Dr. Trula Slade   We may be doing Pet-CT  We will then need Lexiscan stress test to clear for surgery.   Follow-Up:   See Dr. Servando Snare later this summer - I have alerted his office. They will be in touch.     Other Special Instructions:   N/A    If you need a refill on your cardiac medications before your next appointment, please call your pharmacy.   Call the [place] office at [phone] if you have any questions, problems or concerns.

## 2018-04-29 NOTE — Telephone Encounter (Signed)
lvm for pt Ryan from Dr. Everrett Coombe office will be calling to set up PET Scan and will see what this shows before ordering the lexi.

## 2018-04-30 ENCOUNTER — Other Ambulatory Visit: Payer: Self-pay | Admitting: *Deleted

## 2018-04-30 DIAGNOSIS — C3490 Malignant neoplasm of unspecified part of unspecified bronchus or lung: Secondary | ICD-10-CM

## 2018-05-06 ENCOUNTER — Telehealth: Payer: Self-pay | Admitting: Nurse Practitioner

## 2018-05-06 NOTE — Telephone Encounter (Signed)
S/w pt's wife wanted to know if pt could have coffee before PET Scan tomorrow. Stated would have to call [facility] radiology, this office does not typically order Pet Scans and two different directions were noted in chart under appts.for NPO.

## 2018-05-06 NOTE — Telephone Encounter (Signed)
New message    Patient wife has questions about the Pet scan the patient is having done tomorrow , she needs to clarify the instructions

## 2018-05-07 ENCOUNTER — Encounter (HOSPITAL_COMMUNITY): Payer: Medicare Other

## 2018-05-09 ENCOUNTER — Inpatient Hospital Stay: Admit: 2018-05-09 | Payer: Medicare Other | Admitting: Surgery

## 2018-05-09 SURGERY — INSERTION, ENDOVASCULAR STENT GRAFT, AORTA, ABDOMINAL
Anesthesia: General

## 2018-05-13 ENCOUNTER — Ambulatory Visit (HOSPITAL_COMMUNITY)
Admission: RE | Admit: 2018-05-13 | Discharge: 2018-05-13 | Disposition: A | Discharge status: Home / Self Care | Payer: Medicare Other | Source: Ambulatory Visit | Attending: Cardiothoracic Surgery | Admitting: Cardiothoracic Surgery

## 2018-05-13 DIAGNOSIS — C3490 Malignant neoplasm of unspecified part of unspecified bronchus or lung: Secondary | ICD-10-CM

## 2018-05-13 DIAGNOSIS — I714 Abdominal aortic aneurysm, without rupture: Secondary | ICD-10-CM | POA: Insufficient documentation

## 2018-05-13 DIAGNOSIS — J948 Other specified pleural conditions: Secondary | ICD-10-CM | POA: Insufficient documentation

## 2018-05-13 LAB — GLUCOSE, CAPILLARY: Glucose-Capillary: 103 mg/dL — ABNORMAL HIGH (ref 65–99)

## 2018-05-13 MED ORDER — FLUDEOXYGLUCOSE F - 18 (FDG) INJECTION: 6.9000 | Freq: Once | INTRAVENOUS | Status: DC | PRN

## 2018-05-14 ENCOUNTER — Other Ambulatory Visit: Payer: Self-pay | Admitting: *Deleted

## 2018-05-14 DIAGNOSIS — I2510 Atherosclerotic heart disease of native coronary artery without angina pectoris: Secondary | ICD-10-CM

## 2018-05-14 NOTE — Telephone Encounter (Signed)
S/w wife per pt set up lexi, went over all instructions and sent to pre-cert.  Will mail date and ins papers.

## 2018-05-14 NOTE — Telephone Encounter (Signed)
-----   Message from Burtis Junes, NP sent at 05/14/2018  2:16 PM EDT ----- Please let him know that I have looked at the PET scan - looks good. He should be hearing more about this from Dr. Everrett Coombe office.   He needs to go ahead and get a Lexiscan now arranged to proceed on with his pre op clearance for his AAA.   lori ----- Message ----- From: Burtis Junes, NP Sent: 04/30/2018  10:19 AM To: Laury Deep, RN, Burtis Junes, NP  [facility]!  Cecille Rubin ----- Message ----- From: Laury Deep, RN Sent: 04/30/2018  10:13 AM To: Grace Isaac, MD, Serafina Mitchell, MD, #  Cecille Rubin,  PET scheduled for 5/29 and appt for EBG scheduled for 8/15 unless you need it sooner due to the PET scan?    Thanks,  [employer]

## 2018-05-15 ENCOUNTER — Other Ambulatory Visit: Payer: Self-pay | Admitting: Nurse Practitioner

## 2018-05-15 ENCOUNTER — Telehealth (HOSPITAL_COMMUNITY): Payer: Self-pay | Admitting: *Deleted

## 2018-05-15 DIAGNOSIS — Z01818 Encounter for other preprocedural examination: Secondary | ICD-10-CM

## 2018-05-15 NOTE — Telephone Encounter (Signed)
Patient given detailed instructions per Myocardial Perfusion Study Information Sheet for the test on 05/20/18 Patient notified to arrive 15 minutes early and that it is imperative to arrive on time for appointment to keep from having the test rescheduled.  If you need to cancel or reschedule your appointment, please call the office within 24 hours of your appointment. . Patient verbalized understanding. Kirstie Peri, RN

## 2018-05-16 ENCOUNTER — Other Ambulatory Visit: Payer: Self-pay

## 2018-05-16 DIAGNOSIS — I713 Abdominal aortic aneurysm, ruptured, unspecified: Secondary | ICD-10-CM

## 2018-05-19 ENCOUNTER — Ambulatory Visit: Payer: Medicare Other | Admitting: Cardiology

## 2018-05-19 ENCOUNTER — Telehealth: Payer: Self-pay | Admitting: Surgery

## 2018-05-19 ENCOUNTER — Encounter

## 2018-05-19 NOTE — Telephone Encounter (Signed)
sch appt spk to pt wife 06/23/18 10am CTA chest abd pelv 1145am MD

## 2018-05-20 ENCOUNTER — Other Ambulatory Visit: Payer: Self-pay | Admitting: Nurse Practitioner

## 2018-05-20 ENCOUNTER — Ambulatory Visit (HOSPITAL_COMMUNITY): Payer: Medicare Other

## 2018-05-20 ENCOUNTER — Encounter (HOSPITAL_COMMUNITY): Payer: Self-pay

## 2018-05-20 ENCOUNTER — Ambulatory Visit (HOSPITAL_COMMUNITY): Payer: Medicare Other | Attending: Cardiovascular Disease

## 2018-05-20 DIAGNOSIS — Z01810 Encounter for preprocedural cardiovascular examination: Secondary | ICD-10-CM

## 2018-05-20 LAB — EXERCISE TOLERANCE TEST
Estimated workload: 4.6 METS
Exercise duration (min): 2 min
Exercise duration (sec): 51 s
MPHR: 145 {beats}/min
Peak HR: 103 {beats}/min
Percent HR: 71 %
Rest HR: 63 {beats}/min

## 2018-05-21 IMAGING — CT CT ANGIO CHEST
2 of 7 series · 10 of 36 positions shown · IV contrast (iopamidol)
Comparison: Multiple prior studies including chest CTs, abdomen
CTs, abdomen pelvis CTs, and PET CTs.

CLINICAL DATA: Preop planning for abdominal aortic aneurysm

EXAM:
CT ANGIOGRAPHY CHEST, ABDOMEN AND PELVIS
TECHNIQUE: Multidetector CT imaging through the chest, abdomen and pelvis was
performed using the standard protocol during bolus administration of
intravenous contrast. Multiplanar reconstructed images and MIPs were
obtained and reviewed to evaluate the vascular anatomy.
CONTRAST:  75mL [id number] IOPAMIDOL ([id number]) INJECTION 76%
Creatinine was obtained on site at [HOSPITAL] at [REDACTED].
Results: Creatinine 1.1 mg/dL.

[Series 4: cta cap 2.00 bv36 s3 ax axial arterial · axial · arterial · 0.48mm/px · z∈[+1207,+1789]mm · 9 of 331 slices shown]
[im 20/331  lung]
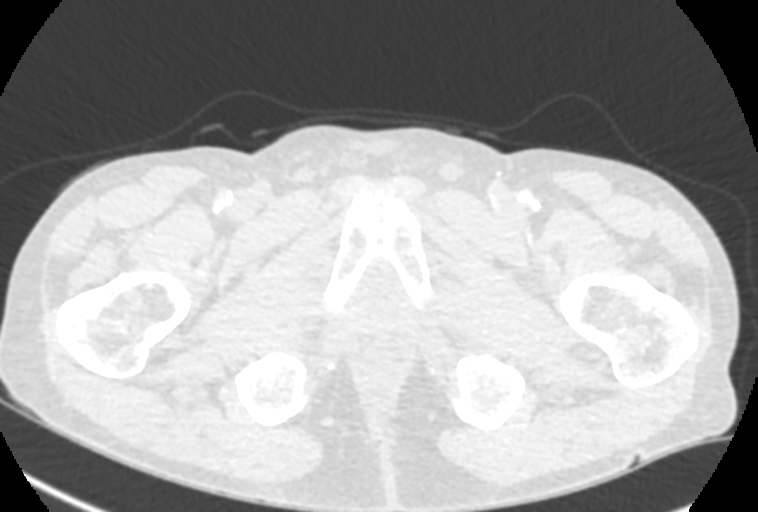
[im 59/331  mediastinal]
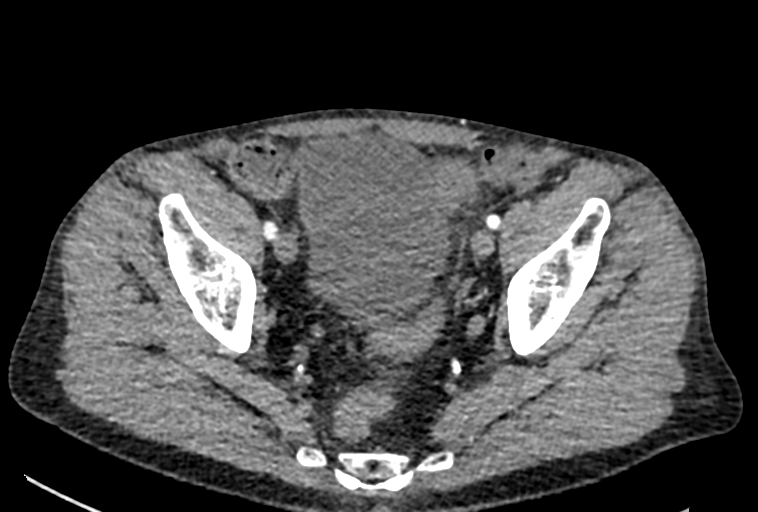
[im 98/331  lung]
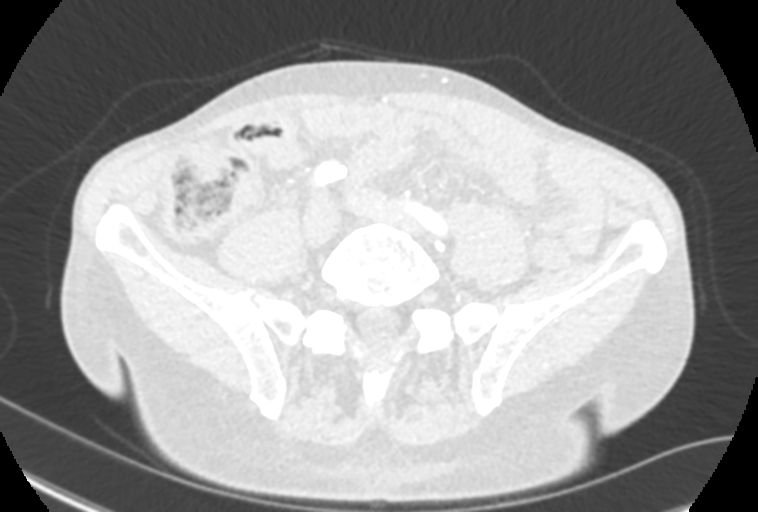
[im 136/331  mediastinal]
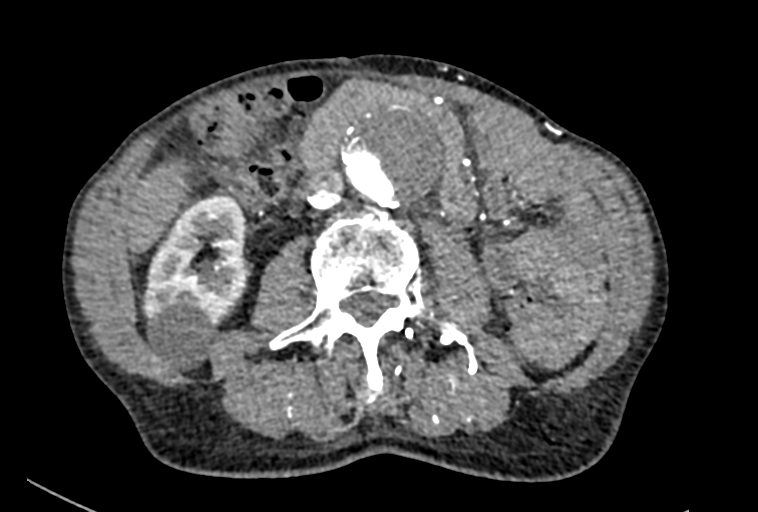
[im 175/331  lung]
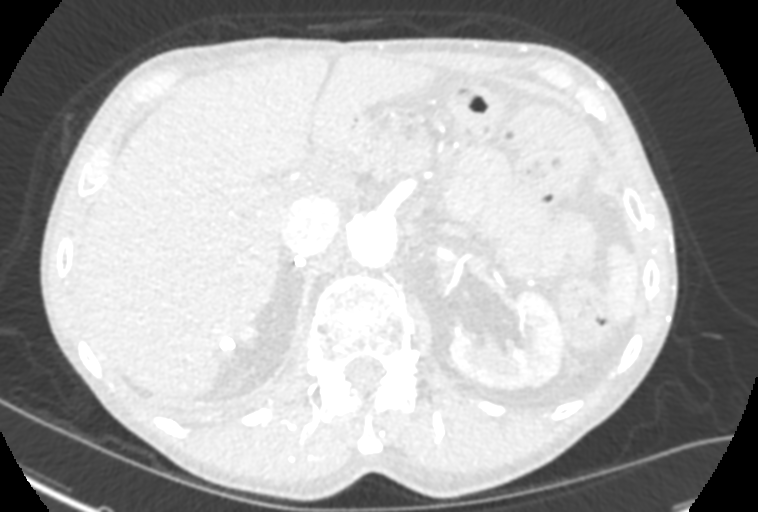
[im 195/331  mediastinal]
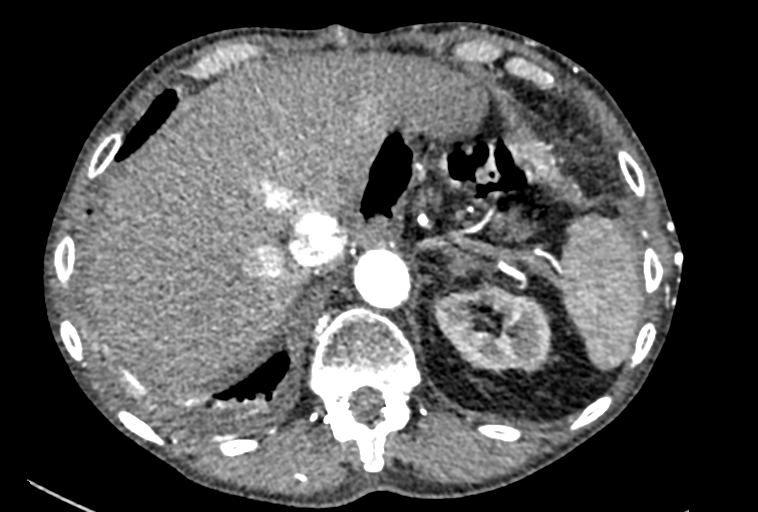
[im 233/331  lung]
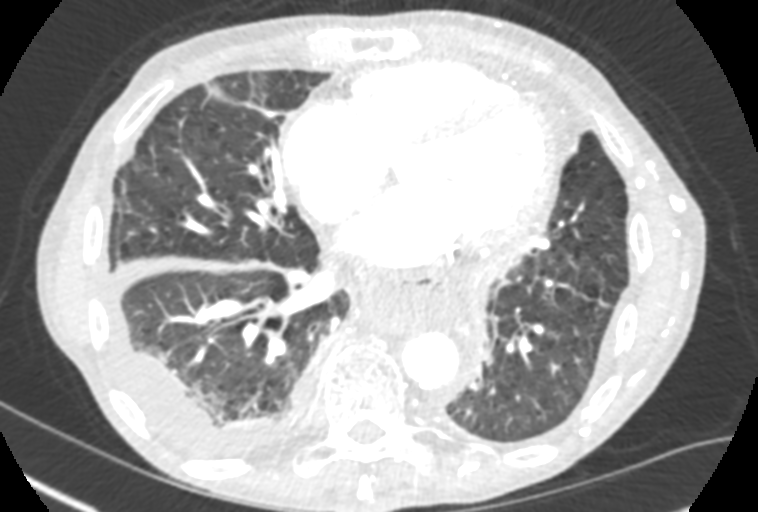
[im 272/331  mediastinal]
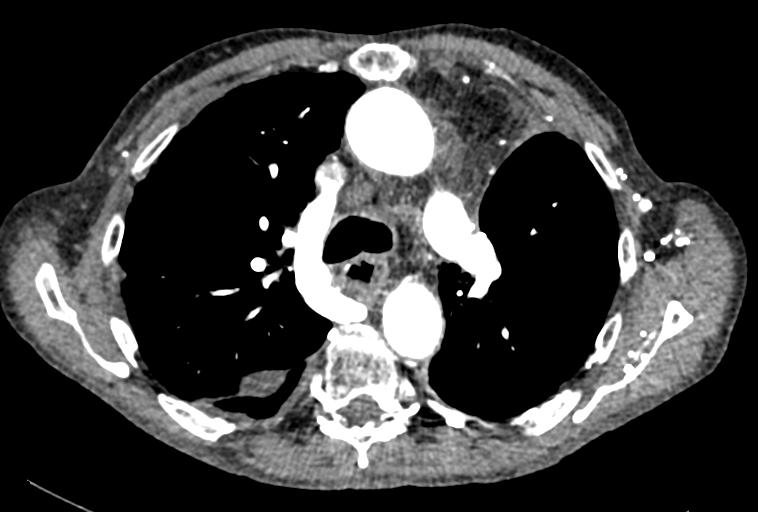
[im 311/331  lung]
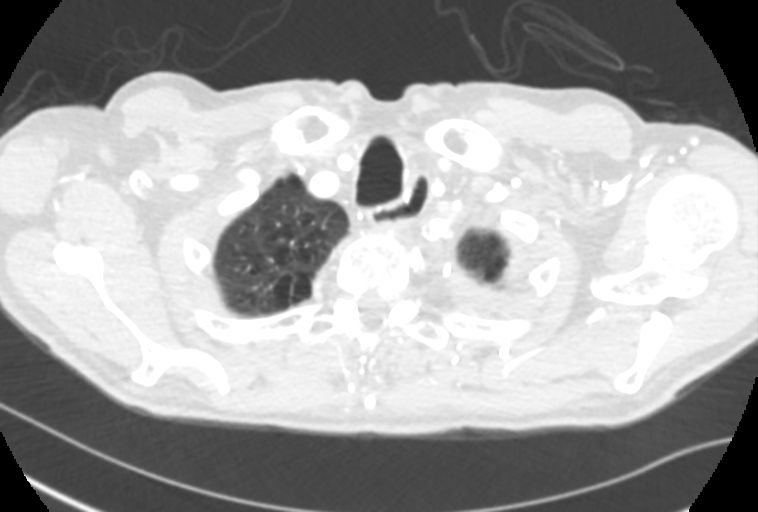

[Series 7: cta cap 2.00 bv36 s3 cor cor st · coronal · 0.71mm/px · 1 of 123 slices shown]
[im 62/123  mediastinal]
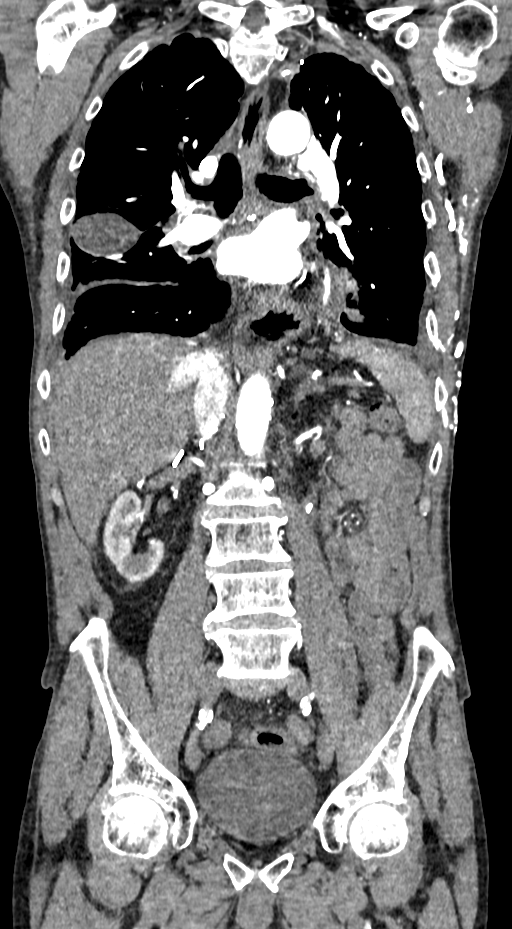

[10 of 36 positions shown; findings below may reference images not displayed]

FINDINGS: CTA CHEST FINDINGS

Cardiovascular: Maximal diameter of the ascending aorta is 4.0 cm
which is unchanged based on my direct measurements on the prior
study. There is no evidence of intramural hematoma or aortic
dissection.

Great vessels are patent. Right vertebral artery is patent. There is
at least a moderate degree of narrowing at the origin of the left
vertebral artery. There is smooth and calcified plaque at its
origin. Left subclavian artery is patent. Atherosclerotic
calcifications are present at the aortic arch extending into the
origin of the left subclavian artery. There is some calcified and
smooth plaque in the left vertebral artery in the neck.

There is no obvious acute pulmonary thromboembolism. Chronic
occlusion of the left innominate vein is suspected.

Moderate left main, circumflex, and LAD territory coronary artery
calcification. Saphenous vein bypass grafts are present with CABG
and sternotomy. Atherosclerotic changes of the descending thoracic
aorta are characterized by calcification. No luminal narrowing.

Mediastinum/Nodes: Status post esophagectomy and gastric
pull-through has a stable appearance. There is stranding within the
mediastinum consistent with postoperative changes. Soft tissue
density adjacent to the SVC, along the right side of the anterior
mediastinum has developed since the prior study. See image 75 of
series 4. This may represent a pleural abnormality or loculated
complex pleural fluid. Thyroid is unremarkable.

Lungs/Pleura: Severe emphysema. A loculated right pleural effusion
is present with fluid in the right major fissure as well as
surrounding the right lung. Heterogeneous opacities at the base of
the right lower and middle lobes are nonspecific. No pneumothorax.

Musculoskeletal: Stable appearance of the thoracic spine with a
mid-level compression deformity. Chronic right rib deformities.

Review of the MIP images confirms the above findings.

CTA ABDOMEN AND PELVIS FINDINGS

VASCULAR

Aorta: An infrarenal abdominal aortic aneurysm is present. Maximal
AP and transverse diameters are 5.0 and 4.9 cm compared with 3.5 cm
in 8333. Chronic mural thrombus in the aneurysmal segment is noted.
There is mixing artifact noted in the abdominal aorta.

Celiac: Moderate narrowing secondary to atherosclerosis and median
arcuate ligament syndrome. Left gastric artery has likely been
ligated. The lesser curvature is reperfused through the right
gastric artery.

SMA: Patent. Atherosclerotic calcification at the origin. Branch
vessels grossly patent.

Renals: Single renal arteries are widely patent with scattered
atherosclerotic calcification. There are no obvious accessory renal
arteries.

IMA: Origin is occluded.  Branch vessels reconstitute.

Inflow: Allowing for mixing artifact, bilateral common, internal,
and external iliac arteries are patent. Scattered atherosclerotic
calcifications are noted.

Review of the MIP images confirms the above findings.

NON-VASCULAR

Hepatobiliary: Postcholecystectomy.  Unremarkable liver.

Pancreas: Atrophic.

Spleen: Unremarkable

Adrenals/Urinary Tract: Left adrenal gland is within normal limits.
There are surgical staples in the right adrenal gland bed suggesting
right adrenalectomy. Multiple low-density lesions throughout both
kidneys are nonspecific. Most are likely simple cysts. There is a
1.0 cm enhancing lesion in the mid left kidney. See image 17 of
series 20. Bladder is unremarkable..

Stomach/Bowel: Status post esophagectomy with gastric pull-through
is noted. There is no evidence of small-bowel obstruction. Colon is
decompressed and without obvious mass.

Lymphatic: No abnormal retroperitoneal adenopathy.

Reproductive: Normal prostate.

Other: No free fluid.

Musculoskeletal: Stable lumbar spine. Osteopenia. No vertebral
compression.

Review of the MIP images confirms the above findings.
IMPRESSION: Vascular:

Maximal diameter of the ascending aorta is 4.0 cm. Recommend annual
imaging followup by CTA or MRA. This recommendation follows 1030
ACCF/AHA/AATS/ACR/ASA/SCA/RUDI/CINQ/MEE LIN/AGHA Guidelines for the
Diagnosis and Management of Patients with Thoracic Aortic Disease.
Circulation. 1030; 121: [id number]

There is at least a moderate degree of narrowing at the origin of
the left vertebral artery.

Infrarenal abdominal aortic aneurysm is present. Maximal diameter is
5.0 cm.

Chronic occlusion at the origin of the IMA. Branches reconstitute.
This is advantageous for stent graft placement.

Chronic occlusion of the left innominate vein.

Status post CABG.

Nonvascular:

Postop changes from gastric pull-through and esophagectomy are
noted. There is new soft tissue thickening along the right side of
the mediastinum. Recurrence is not excluded. PET-CT is warranted.

Loculated right pleural effusion. Malignant pleural effusion is not
excluded.

Enhancing lesion in the mid left kidney. Renal cell carcinoma is not
excluded. MRI with and without contrast is recommended when
feasible.

## 2018-05-22 ENCOUNTER — Telehealth: Payer: Self-pay | Admitting: Emergency Medicine

## 2018-05-22 MED ORDER — TIOTROPIUM BROMIDE-OLODATEROL 2.5-2.5 MCG/ACT IN AERS: 2.0000 | INHALATION_SPRAY | Freq: Every day | RESPIRATORY_TRACT | 0 refills | Status: DC

## 2018-05-22 NOTE — Telephone Encounter (Signed)
Called and spoke with patients wife. She states that patient is out of his stiolto and is needing a refill. Refill sent. Nothing further needed.

## 2018-05-26 IMAGING — DX DG CHEST 1V PORT
1 series · 1 of 1 positions shown · non-contrast
Comparison: Scout from the CT of 03/03/2018.

CLINICAL DATA: Shortness of breath. Right-sided lung cancer with
surgery and radiation therapy. Pneumonia. Coronary artery disease.
Aneurysm.

EXAM:
PORTABLE CHEST 1 VIEW

[chest ap]
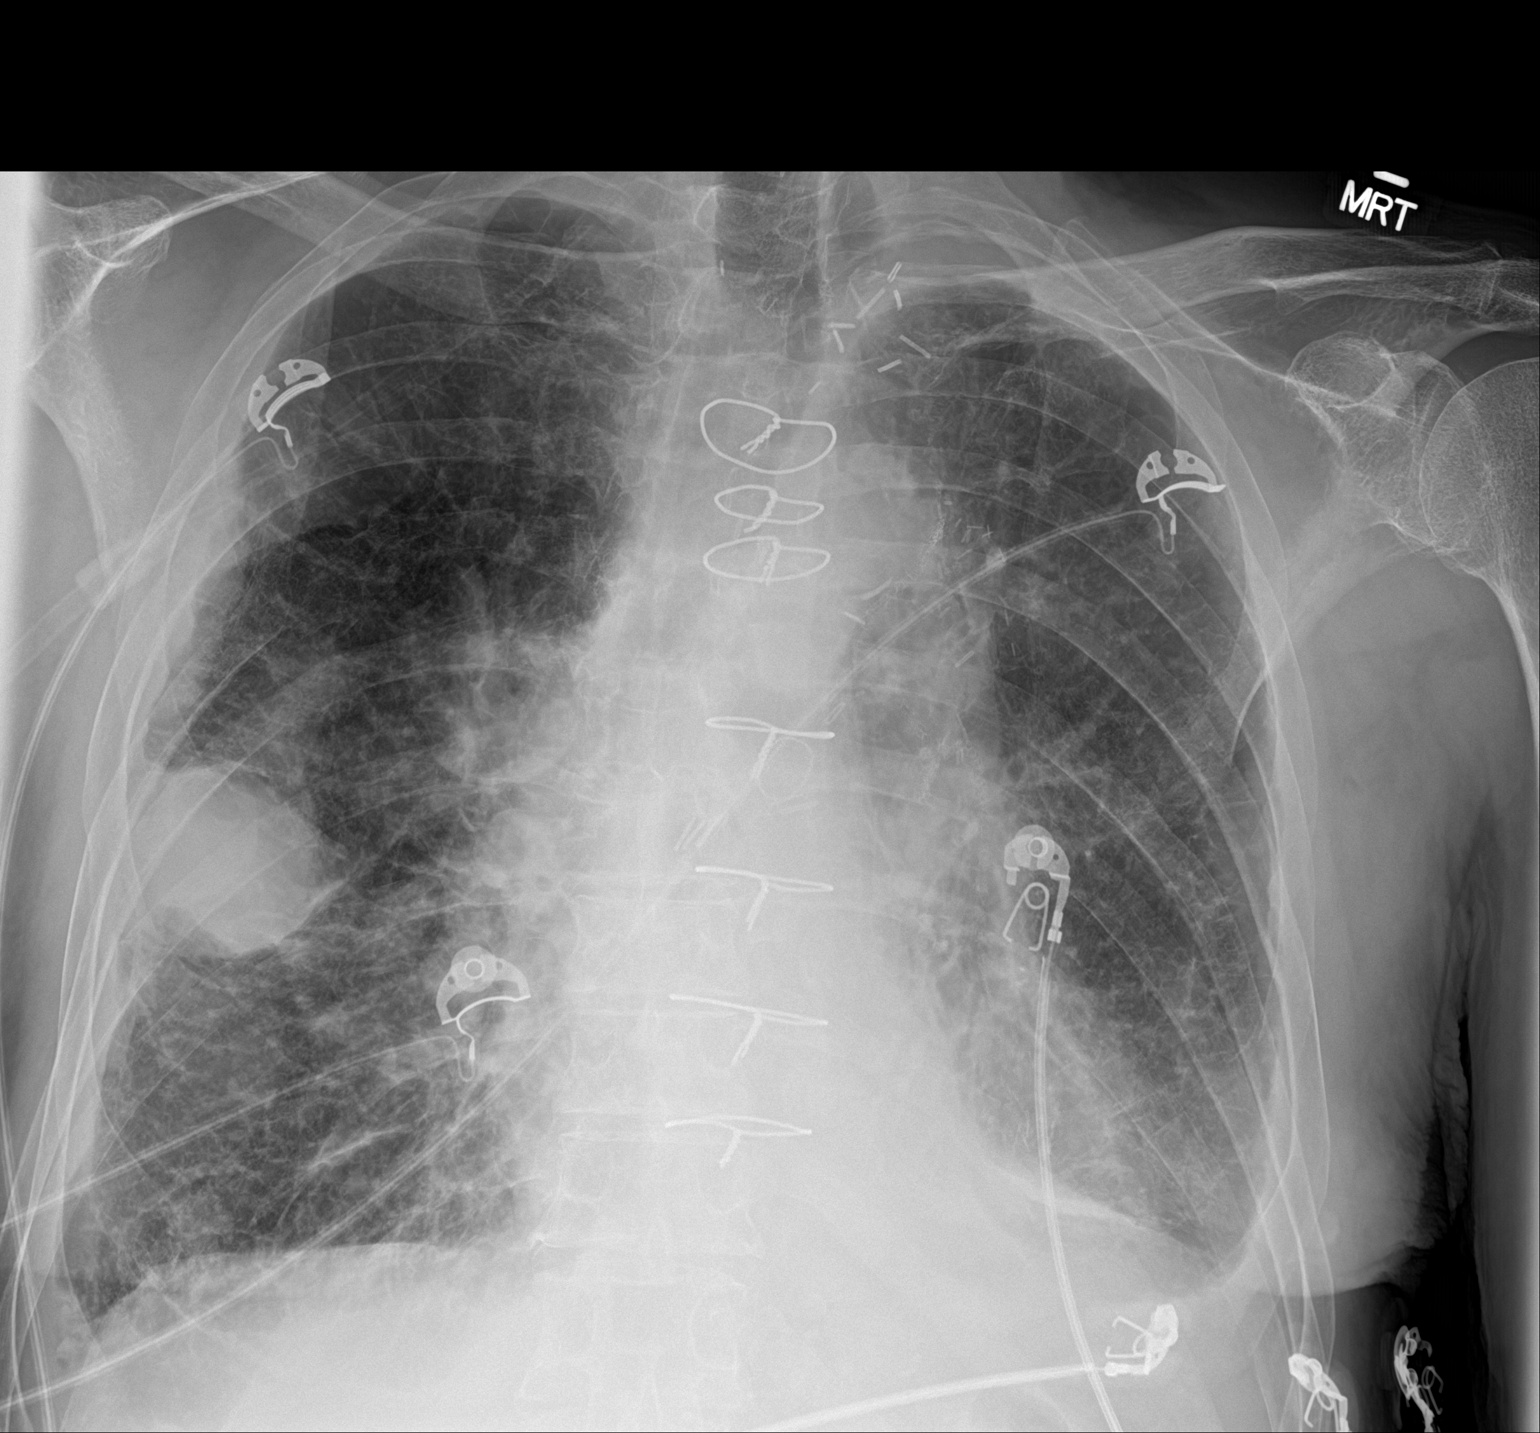

[1 of 1 positions shown; findings below may reference images not displayed]

FINDINGS: Midline trachea. Mild cardiomegaly. Prior median sternotomy. Small
left pleural effusion is similar. Loculated right-sided pleural
effusion is not significantly changed. Biapical pleuroparenchymal
scarring. No pneumothorax. Basilar predominant interstitial
thickening is moderate. No new pulmonary opacity. Scarring at the
left lung base.
IMPRESSION: Compared to the scout film of the CT of 03/03/2018, no gross
interval change.

Bilateral pleural effusions, including a loculated right-sided
effusion.

Chronic interstitial thickening suspicious for mild pulmonary venous
congestion, superimposed upon emphysema.

## 2018-05-27 IMAGING — NM NM PULMONARY VENT & PERF
15 series · 15 of 15 positions shown · non-contrast
Comparison: Chest radiograph 03/08/2018

CLINICAL DATA: Evaluate for pulmonary embolus.

EXAM:
NUCLEAR MEDICINE VENTILATION - PERFUSION LUNG SCAN
TECHNIQUE: Ventilation images were obtained in multiple projections using
inhaled aerosol 9c-EEm DTPA. Perfusion images were obtained in
multiple projections after intravenous injection of Yc-NNm-GKK.
RADIOPHARMACEUTICALS:  32.5 mCi of 9c-EEm DTPA aerosol inhalation
and 4.12 mCi [id number]-[id number] IV

[Series 1: ant/post vent · 4.14mm/px · 1 of 1 slices shown (1 of 2)]
[im 1/1]
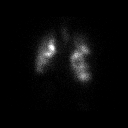

[Series 1: ant/post vent · 4.14mm/px · 1 of 1 slices shown (2 of 2)]
[im 1/1]
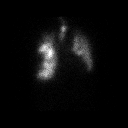

[Series 2: lao/rpo vent · 4.14mm/px · 1 of 1 slices shown (1 of 2)]
[im 1/1]
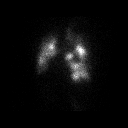

[Series 2: lao/rpo vent · 4.14mm/px · 1 of 1 slices shown (2 of 2)]
[im 1/1]
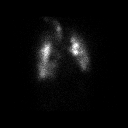

[Series 3: lpo/rao vent · 4.14mm/px · 1 of 1 slices shown (1 of 2)]
[im 1/1]
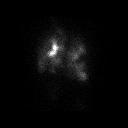

[Series 3: lpo/rao vent · 4.14mm/px · 1 of 1 slices shown (2 of 2)]
[im 1/1]
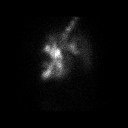

[Series 4: lt lat/rt lat vent · 4.14mm/px · 1 of 1 slices shown (1 of 2)]
[im 1/1]
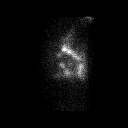

[Series 4: lt lat/rt lat vent · 4.14mm/px · 1 of 1 slices shown (2 of 2)]
[im 1/1]
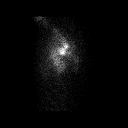

[Series 5: lt lat/rt lat perf · 4.14mm/px · 1 of 1 slices shown (1 of 2)]
[im 1/1]
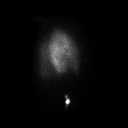

[Series 5: lt lat/rt lat perf · 4.14mm/px · 1 of 1 slices shown (2 of 2)]
[im 1/1]
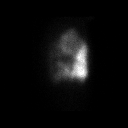

[Series 6: lpo/rao perf · 4.14mm/px · 1 of 1 slices shown (1 of 2)]
[im 1/1]
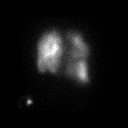

[Series 6: lpo/rao perf · 4.14mm/px · 1 of 1 slices shown (2 of 2)]
[im 1/1]
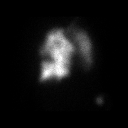

[Series 7: ant/post perf · 4.14mm/px · 1 of 1 slices shown]
[im 1/1]
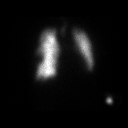

[Series 8: lao/rpo perf · 4.14mm/px · 1 of 1 slices shown (1 of 2)]
[im 1/1]
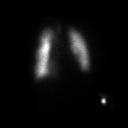

[Series 8: lao/rpo perf · 4.14mm/px · 1 of 1 slices shown (2 of 2)]
[im 1/1]
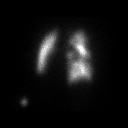

[15 of 15 positions shown; findings below may reference images not displayed]

FINDINGS: Ventilation: There is a large defect within the right midlung which
corresponds to the right loculated effusion in the right midlung.
Peripheral nonsegmental decreased ventilation to the right upper
lobe is identified. This corresponds with an area of peripheral
pleural thickening or loculated pleural fluid on chest radiograph.

Perfusion: There is a perfusion defect within the right midlung
corresponding to the ventilation abnormality. No
wedge-shaped/segmental perfusion defects identified to suggest acute
pulmonary embolus. Nonsegmental decreased perfusion to the periphery
of the right upper lobe corresponds to the ventilation and chest
radiograph abnormality.
IMPRESSION: No suspicious segmental perfusion defects identified to suggest
acute pulmonary embolus.

## 2018-06-13 IMAGING — CT CT HEAD W/O CM
1 series · 16 of 30 positions shown, 20 images · non-contrast
Comparison: None.

CLINICAL DATA: History of cerebral aneurysm. Evaluation of aneurysm
clip.

EXAM:
CT HEAD WITHOUT CONTRAST
TECHNIQUE: Contiguous axial images were obtained from the base of the skull
through the vertex without intravenous contrast.

[Series 2: head w/(date) · axial · 0.42mm/px · z∈[+1015,+1150]mm · 16 of 30 slices shown, 20 images]
[im 2/30  brain]
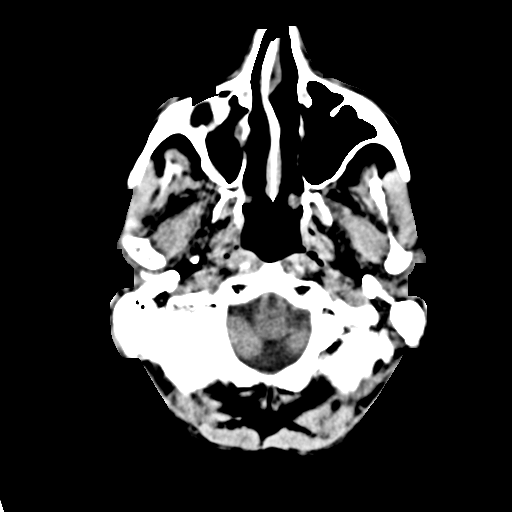
[im 2/30  bone]
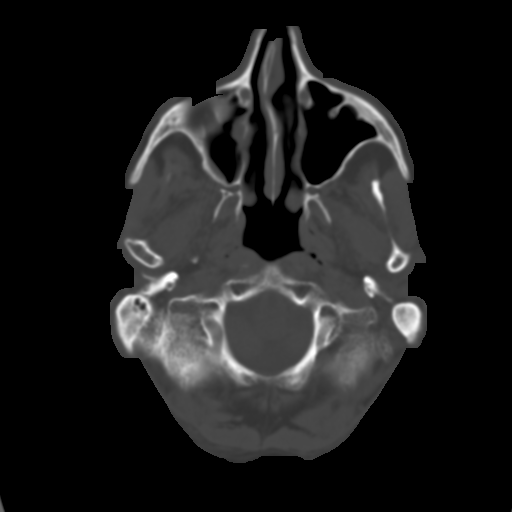
[im 4/30  brain]
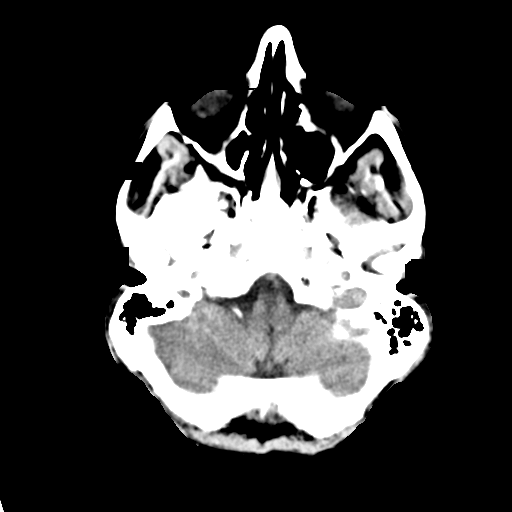
[im 6/30  brain]
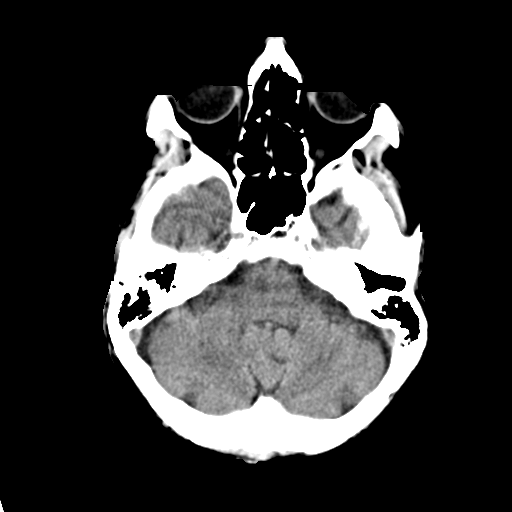
[im 8/30  brain]
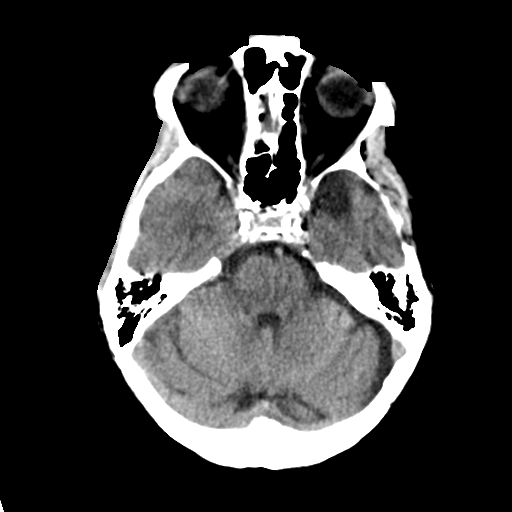
[im 9/30  brain]
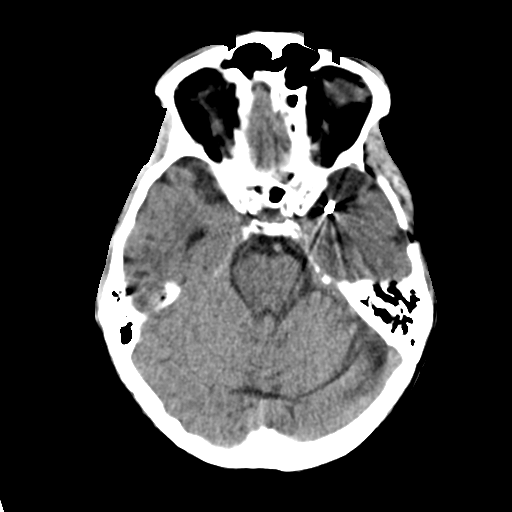
[im 9/30  bone]
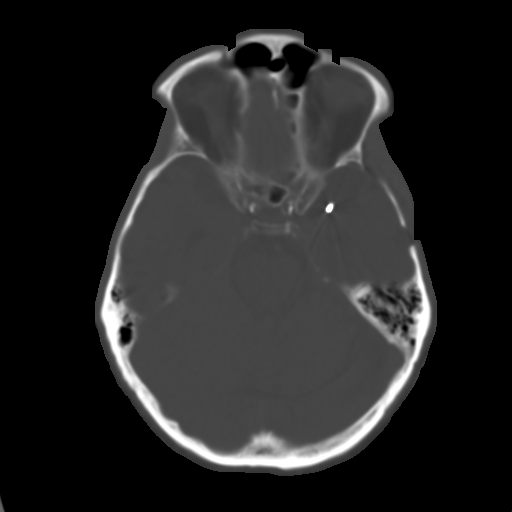
[im 11/30  brain]
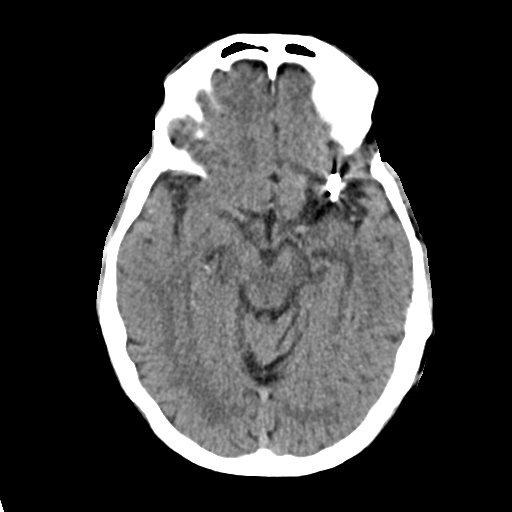
[im 13/30  brain]
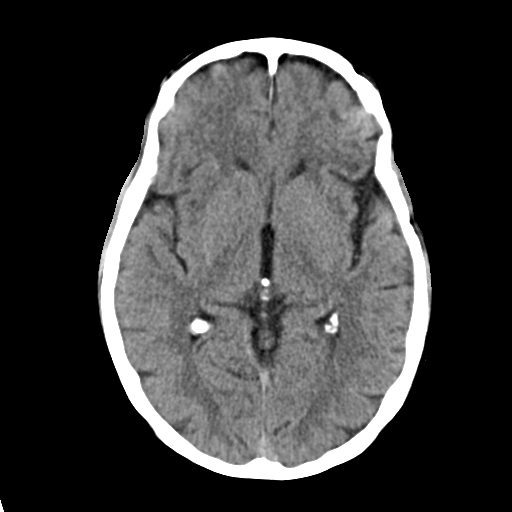
[im 15/30  brain]
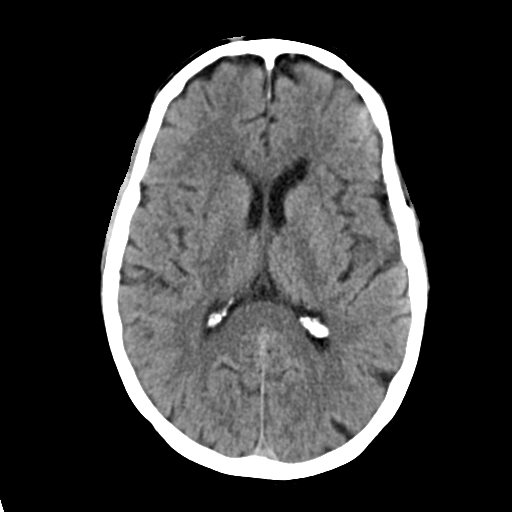
[im 16/30  brain]
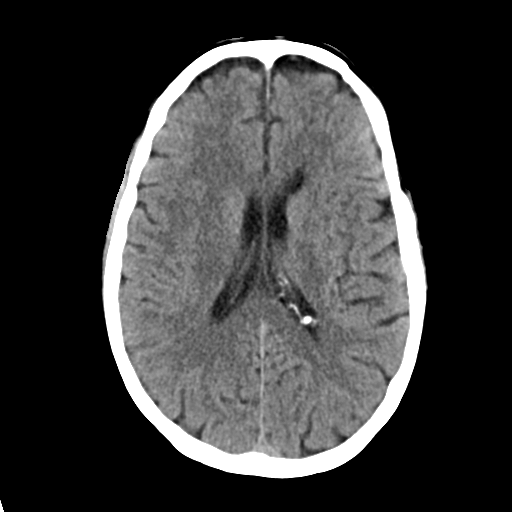
[im 16/30  bone]
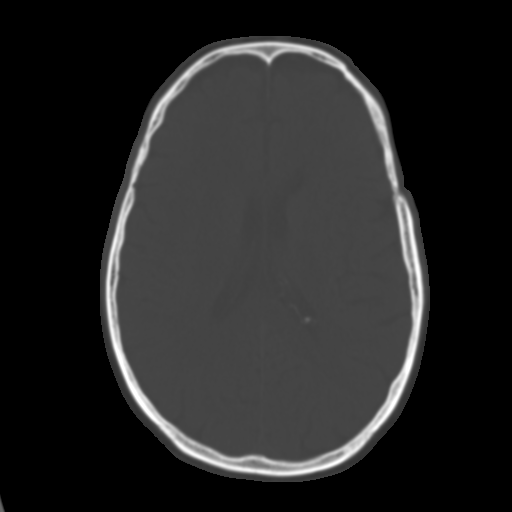
[im 18/30  brain]
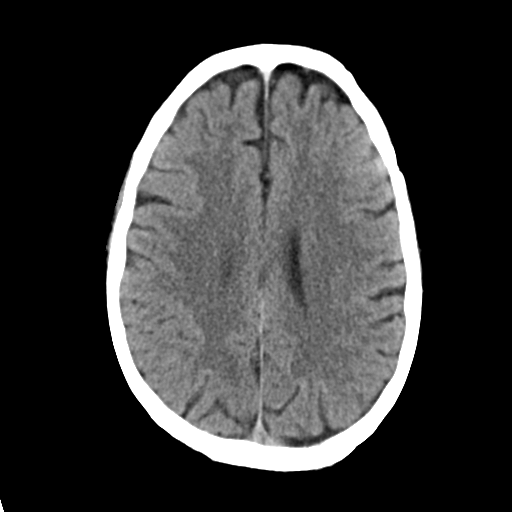
[im 20/30  brain]
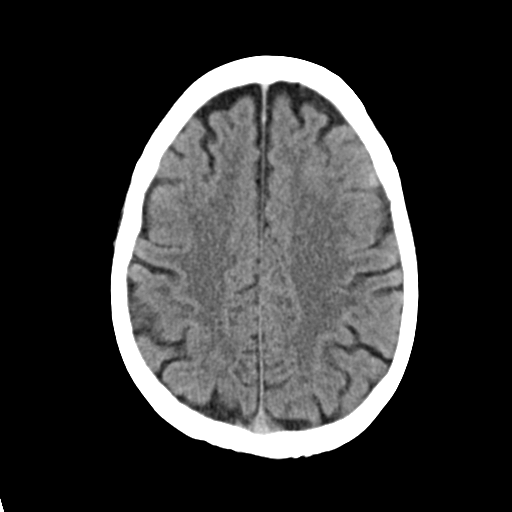
[im 22/30  brain]
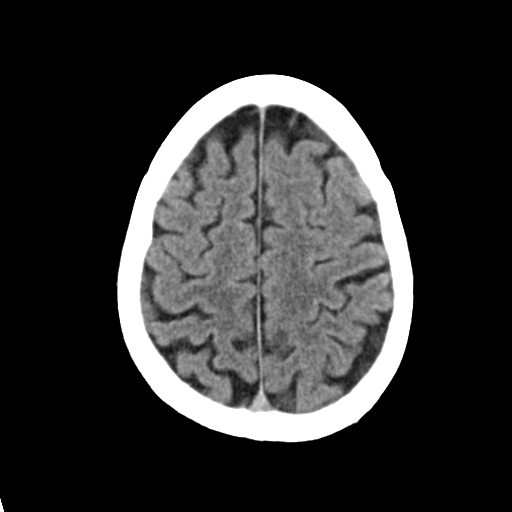
[im 23/30  brain]
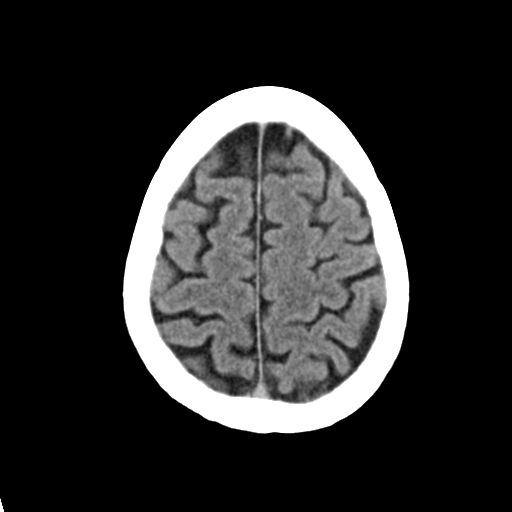
[im 23/30  bone]
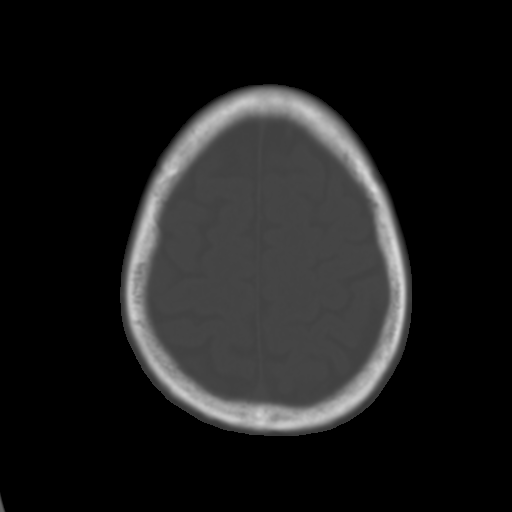
[im 25/30  brain]
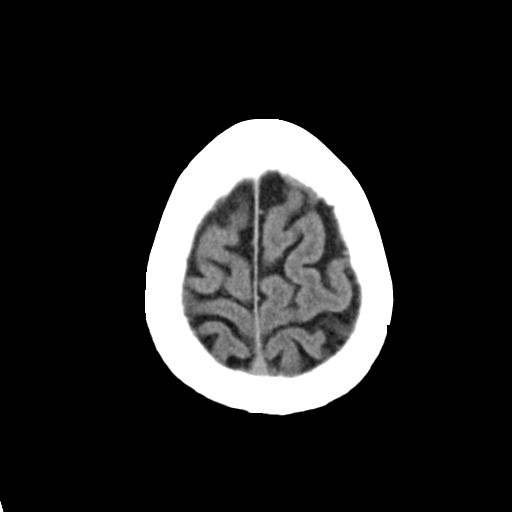
[im 27/30  brain]
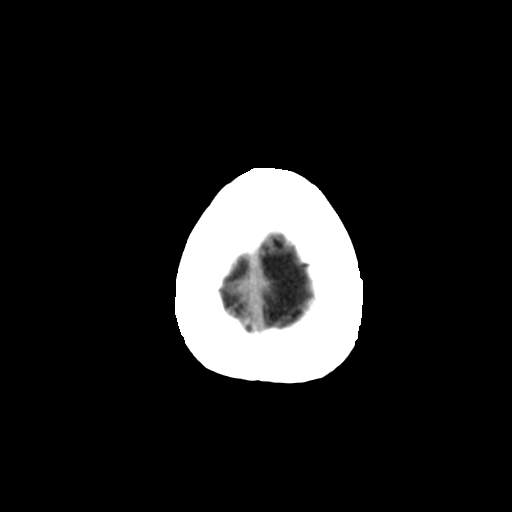
[im 29/30  brain]
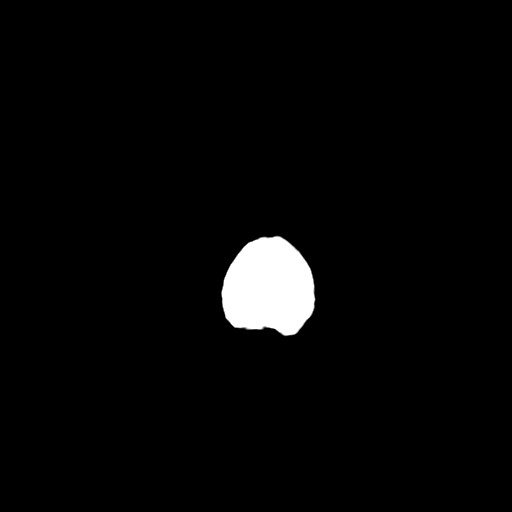

[16 of 30 positions shown; findings below may reference images not displayed]

FINDINGS: Brain: Sequelae of prior left MCA aneurysm clipping are identified
with mild left frontotemporal encephalomalacia. There is no evidence
of acute infarct, intracranial hemorrhage, mass, midline shift, or
extra-axial fluid collection. The ventricles are normal in size.

Vascular: Calcified atherosclerosis at the skull base. Metallic left
MCA aneurysm clip.

Skull: Left temporal craniotomy.

Sinuses/Orbits: Focal defect in the right lamina papyracea, possibly
a remote medial orbital fracture. Bilateral cataract extraction.
Mild right maxillary sinus mucosal thickening. Clear mastoid air
cells.

Other: None.
IMPRESSION: 1. Prior left MCA aneurysm clipping.
2. No evidence of acute intracranial abnormality.

## 2018-06-20 IMAGING — DX DG CHEST 2V
2 series · 2 of 2 positions shown · non-contrast
Comparison: 03/08/2018

CLINICAL DATA: Follow-up pleural effusion

EXAM:
CHEST - 2 VIEW

[chest pa]
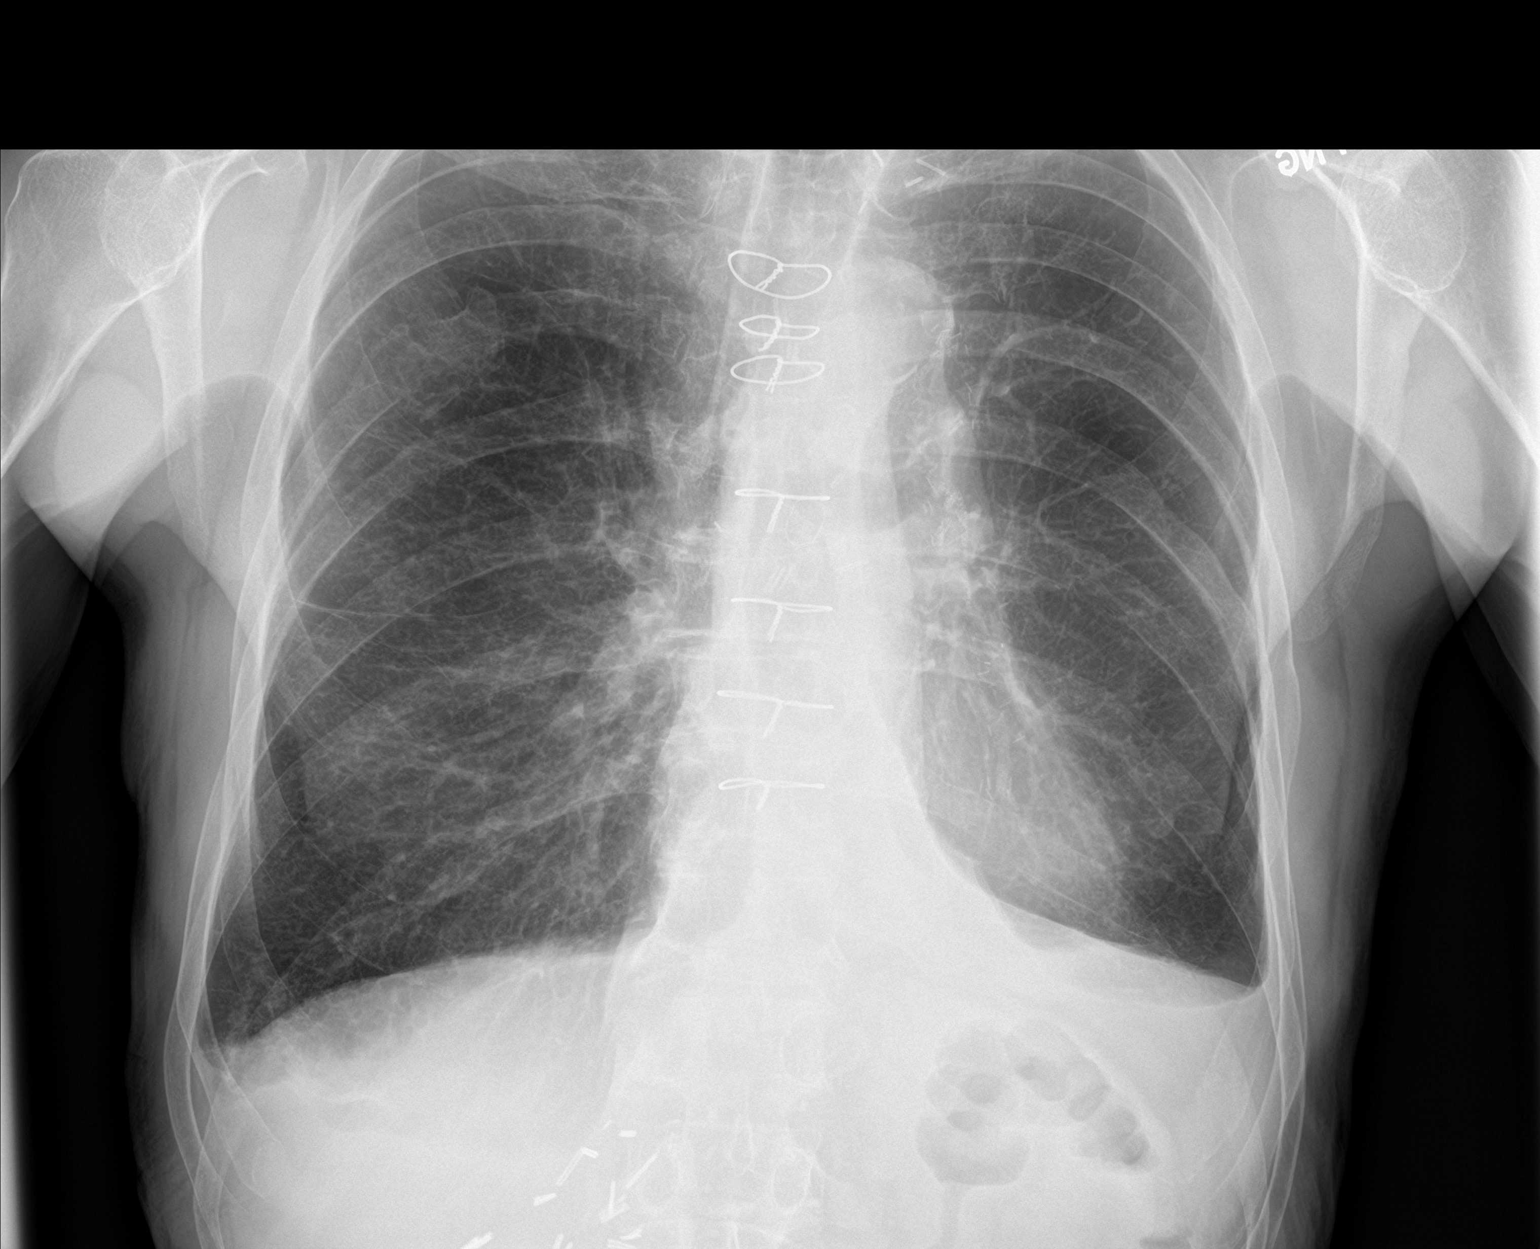

[chest lat]
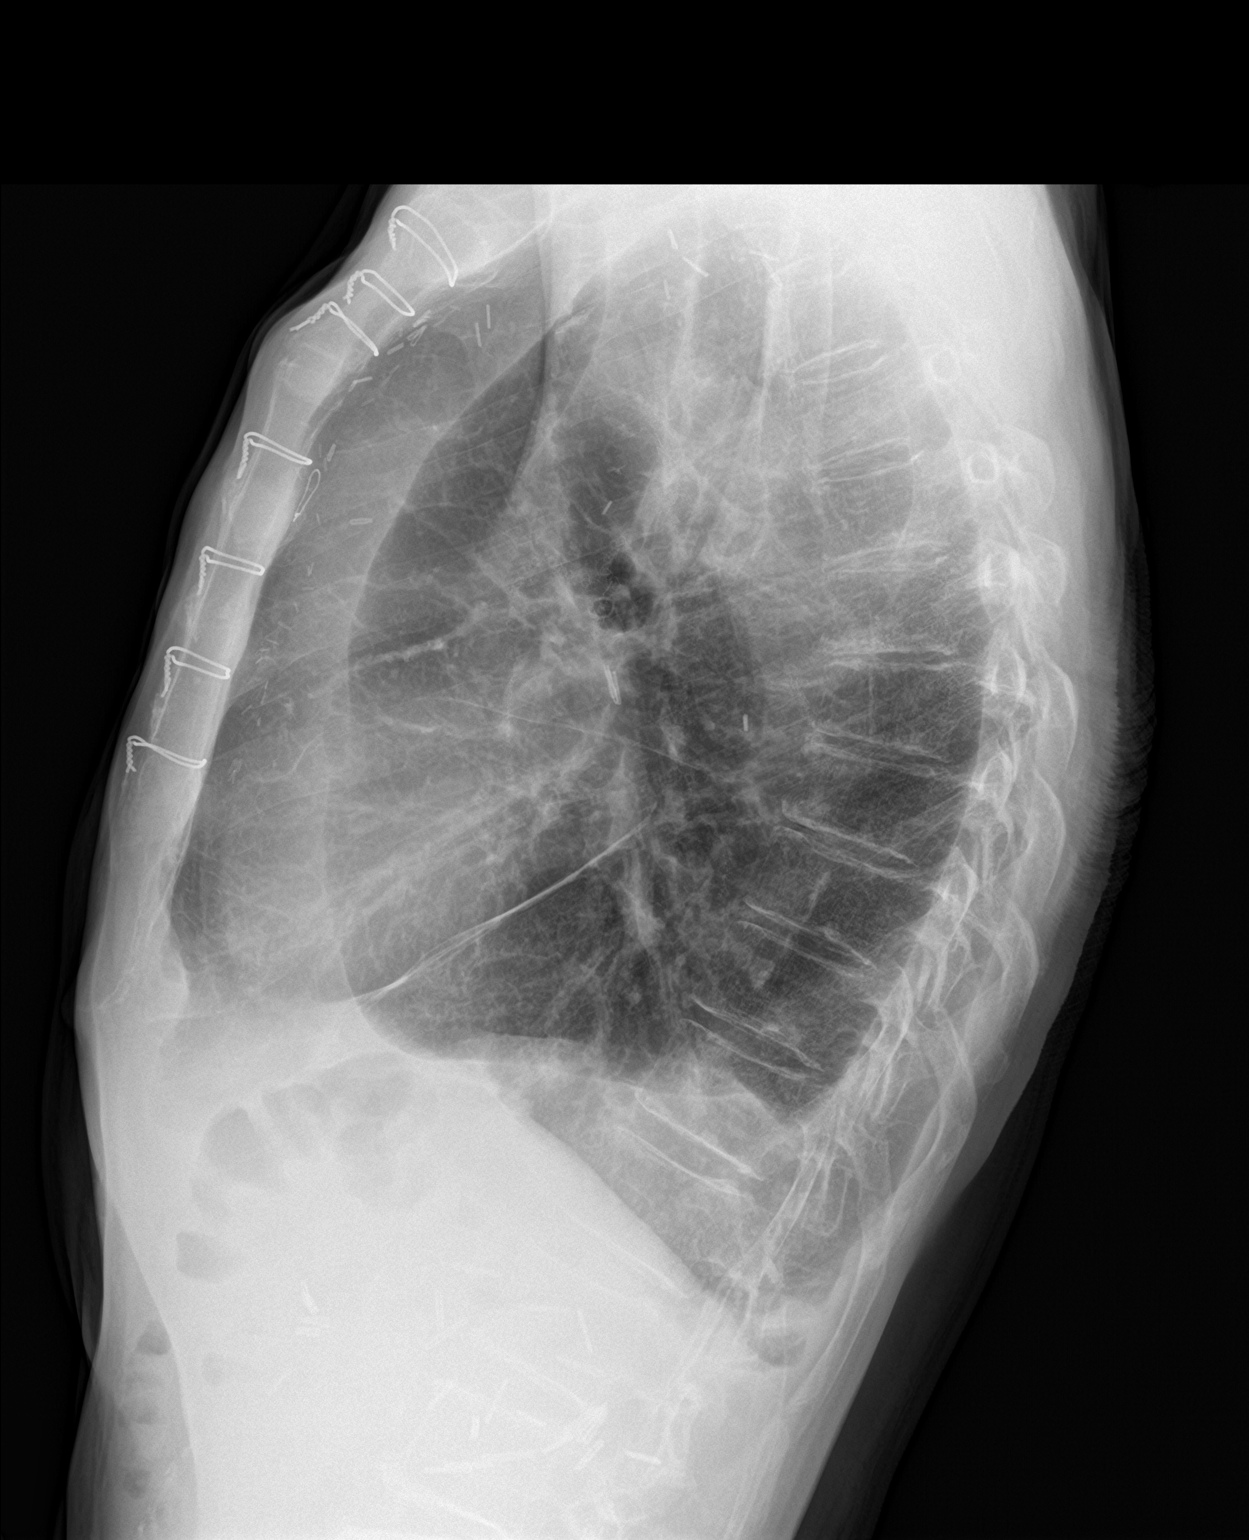

[2 of 2 positions shown; findings below may reference images not displayed]

FINDINGS: Cardiac shadow is within normal limits. Small chronic blunting of
left costophrenic angle is again seen. Previously seen right-sided
pleural effusion is nearly completely resolved especially the
loculated component within the minor fissure creating the masslike
density on prior chest x-ray. No new focal infiltrate or sizable
effusion is seen. Old rib trauma is noted on the right. No acute
abnormality is noted.
IMPRESSION: Stable small left pleural effusion chronic in nature.

Near complete resolution of previously seen right-sided effusion to
include the loculated component seen on prior CT examination.

## 2018-06-23 ENCOUNTER — Other Ambulatory Visit: Payer: Self-pay

## 2018-06-23 ENCOUNTER — Ambulatory Visit: Payer: Medicare Other | Admitting: Surgery

## 2018-06-23 ENCOUNTER — Ambulatory Visit
Admission: RE | Admit: 2018-06-23 | Discharge: 2018-06-23 | Disposition: A | Discharge status: Home / Self Care | Payer: Medicare Other | Source: Ambulatory Visit | Attending: Surgery | Admitting: Surgery

## 2018-06-23 ENCOUNTER — Encounter: Payer: Self-pay | Admitting: Surgery

## 2018-06-23 VITALS — BP 100/66 | HR 84 | Temp 97.0°F | Resp 16 | Ht 70.0 in | Wt 144.0 lb

## 2018-06-23 DIAGNOSIS — I714 Abdominal aortic aneurysm, without rupture, unspecified: Secondary | ICD-10-CM

## 2018-06-23 DIAGNOSIS — I713 Abdominal aortic aneurysm, ruptured, unspecified: Secondary | ICD-10-CM

## 2018-06-23 MED ORDER — IOPAMIDOL (ISOVUE-370) INJECTION 76%
75.0000 mL | Freq: Once | INTRAVENOUS | Status: AC | PRN
  Administered 2018-06-23: 75 mL via INTRAVENOUS

## 2018-06-23 NOTE — Progress Notes (Signed)
[facility] [place]  Patient name: Glen Green MRN: [id number] DOB: January 06, 1941 Sex: male   REASON FOR VISIT:    Follow up  HISOTRY OF PRESENT ILLNESS:    Glen Green is a 77 y.o. male who returns today for follow-up of his abdominal aortic aneurysm.    The patient has a history of an ischemic heart disease. He presented in August of 2014 with shock and heart failure and was found to have an occluded LAD and circumflex. He underwent emergent cardiac bypass surgery. 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  He has a history of esophageal cancer, status post distal esophagectomy and 2004, followed by chemotherapy. He is also status post right adrenalectomy and left upper lobectomy in 2011, presumably for metastatic disease.  He suffers from hypercholesterolemia, treated with a statin. He has a history of smoking but quit in 2004. He also has a history of a ruptured cerebral aneurysm.  The patient does complain of cramping in his legs with walking.  He is a diabetic.  Most recent A1c is 6.9  PAST MEDICAL HISTORY:   Past Medical History:  Diagnosis Date  . AAA (abdominal aortic aneurysm) ([place]) 08/22/2012   3.9 cm by CT  - June 2013, stable   . 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 Atrial fibrillation ([place])   . CAD (coronary artery disease)   . 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 Carotid artery occlusion   . Cerebral aneurysm    [place]. repair  1994  . Dyslipidemia   . History of esophageal cancer    s/p transhiatal esophagogastrectomy  . History of lung cancer   . Hypothyroidism   . Pneumonia   . Postoperative atrial fibrillation ([place]) 12/04/2013   Short course of amiodarone, resolved. Postop bypass  . SBO (small bowel obstruction) ([place]) 08/15/2012   History of small bowel obstruction (status post small bowel       resection, lysis of adhesions, incidental appendectomy, and  repair       of left diaphragmatic hernia  . Stroke [facility]) 1995   denies residual     FAMILY HISTORY:   Family History  Problem Relation Age of Onset  . Aneurysm Mother   . Kidney disease Father   . Heart disease Maternal Uncle   . Heart disease Paternal Uncle   . Aneurysm Maternal Grandmother   . Diabetes Sister     SOCIAL HISTORY:   Social History   Tobacco Use  . Smoking status: Former Smoker    Types: Cigarettes    Last attempt to quit: 12/10/2002    Years since quitting: 15.5  . Smokeless tobacco: Never Used  Substance Use Topics  . Alcohol use: No     ALLERGIES:   No Known Allergies   CURRENT MEDICATIONS:   Current Outpatient Medications  Medication Sig Dispense Refill  . aspirin 81 MG tablet Take 1 tablet (81 mg total) by mouth daily. 30 tablet 0  . atorvastatin (LIPITOR) 40 MG tablet Take 1 tablet (40 mg total) by mouth daily at 6 PM. 90 tablet 2  . carvedilol (COREG) 12.5 MG tablet TAKE 1 TABLET (12.5 MG TOTAL) BY MOUTH 2 (TWO) TIMES DAILY 180 tablet 3  . diphenhydramine-acetaminophen (TYLENOL PM) 25-500 MG TABS tablet Take 2 tablets by mouth at bedtime as needed (for sleep better).    . furosemide (LASIX) 20 MG tablet Take 1 tablet (20 mg total) by  mouth daily. 30 tablet 11  . guaiFENesin (MUCINEX) 600 MG 12 hr tablet Take 1,200 mg by mouth 2 (two) times daily as needed for congestion.    Marland Kitchen lisinopril (PRINIVIL,ZESTRIL) 5 MG tablet TAKE 1 TABLET BY MOUTH EVERY DAY (Patient taking differently: TAKE 1 TABLET (5 MG TOTALLY) BY MOUTH EVERY DAY) 90 tablet 3  . Tiotropium Bromide-Olodaterol (STIOLTO RESPIMAT) 2.5-2.5 MCG/ACT AERS Inhale 2 puffs into the lungs daily. 2 Inhaler 0   No current facility-administered medications for this visit.     REVIEW OF SYSTEMS:   [X]  denotes positive finding, [ ]  denotes negative finding Cardiac  Comments:  Chest pain or chest pressure:    Shortness of breath upon exertion:    Short of breath when lying flat:      Irregular heart rhythm: x       Vascular    Pain in calf, thigh, or hip brought on by ambulation:    Pain in feet at night that wakes you up from your sleep:     Blood clot in your veins:    Leg swelling:         Pulmonary    Oxygen at home:    Productive cough:     Wheezing:         Neurologic    Sudden weakness in arms or legs:     Sudden numbness in arms or legs:     Sudden onset of difficulty speaking or slurred speech:    Temporary loss of vision in one eye:     Problems with dizziness:         Gastrointestinal    Blood in stool:     Vomited blood:         Genitourinary    Burning when urinating:     Blood in urine:        Psychiatric    Major depression:         Hematologic    Bleeding problems:    Problems with blood clotting too easily:        Skin    Rashes or ulcers:        Constitutional    Fever or chills:      PHYSICAL EXAM:   Vitals:   06/23/18 1145  BP: 100/66  Pulse: 84  Resp: 16  Temp: (!) 97 F (36.1 C)  TempSrc: Oral  SpO2: 96%  Weight: 144 lb (65.3 kg)  Height: 5\' 10"  (1.778 m)    GENERAL: The patient is a well-nourished male, in no acute distress. The vital signs are documented above. CARDIAC: There is a regular rate and rhythm.  PULMONARY: Non-labored respirations ABDOMEN: Soft and non-tender with normal pitched bowel sounds.  MUSCULOSKELETAL: There are no major deformities or cyanosis. NEUROLOGIC: No focal weakness or paresthesias are detected. SKIN: There are no ulcers or rashes noted. PSYCHIATRIC: The patient has a normal affect.  STUDIES:   I have reviewed his CT angiogram with the following findings: 1. Stable top-normal size of the ascending thoracic aorta measuring 4.0 cm in maximum diameter. 2. Stable appearance of probable significant origin stenosis of the left vertebral artery. There also is calcified plaque at the origin of the right vertebral artery causing at least 40-50% stenosis. Correlation suggested  with any clinical symptoms of vertebrobasilar insufficiency. 3. Decreased prominence of some previously visualized mediastinal lymph nodes. A partially loculated right pleural effusion has also essentially resolved. 4. Stable infrarenal abdominal aortic aneurysm measuring 4.9 x 4.9 cm  on the current study. There is very little measurable infrarenal neck below the left renal artery. 5. 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 6. Probable moderate stenosis at the origin of the right common iliac artery of at least 50%. 7. Significant occlusive disease of bilateral internal iliac artery trunks, right greater than left. 8. Stable size of small solid cortical lesion of the posterior interpolar left kidney measuring approximately 12 mm in greatest diameter. This remains suspicious for a small renal carcinoma and was not present in 2014. Recommend continued surveillance, referral to urology and consideration of MRI characterization.   MEDICAL ISSUES:   AAA: We discussed the CT scan results today.  Based on the size of the aneurysm and the fact that he is going to need a fenestrated repair, we have elected to delay operation until he gets closer to 5.5 cm.  The patient understands that he is at risk for rupture but that risk is very small.  I do not believe he is a very good open candidate because of his history of esophagectomy and Iver Lewis procedure.  I will have him follow-up with me in 6 months with repeat CT scan.    Annamarie Major, MD [facility] ([facility]) [phone] Pager [phone]

## 2018-07-19 ENCOUNTER — Other Ambulatory Visit: Payer: Self-pay | Admitting: Emergency Medicine

## 2018-07-21 ENCOUNTER — Other Ambulatory Visit: Payer: Self-pay

## 2018-07-21 DIAGNOSIS — I714 Abdominal aortic aneurysm, without rupture, unspecified: Secondary | ICD-10-CM

## 2018-07-24 ENCOUNTER — Ambulatory Visit: Payer: Medicare Other | Admitting: Cardiothoracic Surgery

## 2018-07-24 NOTE — Progress Notes (Signed)
[address]       [place] [address]             [phone]                    [name] Medical Record #[id number] Date of Birth: Sep 28, 1941  Referring: Myriam Jacobson, MD Primary Care: Myriam Jacobson, MD  Chief Complaint:    Chief Complaint  [name] presents with  . Lung Cancer    s/p LULobectomy...PET 05/13/18  . Esophageal Cancer    s/p ESOPHAGECTOMY  . TAA    History of Present Illness:      [name] has a long history of multiple different cancers and surgical resection. 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 [name] later underwent removal of Glen right adrenal gland for metastatic disease. 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   [name] presented with acute myocardial infarction while playing golf and underwent emergency coronary artery bypass grafting by Dr. Cyndia Bent an emergency basis. [name] is tolerated Glen well and has returned to near normal activities.  [name] was being considered for endograft abdominal aortic aneurysm repair, CT scan done for Glen suggested recurrent tumor.  To evaluate Glen further a PET scan was ordered, there was no evidence of recurrent tumor on PET scan.  [name] has been followed by vascular surgery for consideration of endovascular repair of abdominal aortic aneurysm, currently [name] is under observation  Current Activity/ Functional Status:  [name] is independent with mobility/ambulation, transfers, ADL's, IADL's.  Zubrod Score: At Glen time of surgery [name] most appropriate activity status/level should be described as: [x]  Normal activity, no symptoms []  Symptoms, fully ambulatory []  Symptoms, in bed less than or equal to 50% of  Glen time []  Symptoms, in bed greater than 50% of Glen time but less than 100% []  Bedridden []  Moribund   Past Medical History:  Diagnosis Date  . AAA (abdominal aortic aneurysm) ([place]) 08/22/2012   3.9 cm by CT  - June 2013, stable   . 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 Atrial fibrillation ([place])   . CAD (coronary artery disease)   . Cancer ([place])    Esophageal, adrenal gland, skin; lung  . Carotid artery occlusion   . Cerebral aneurysm    [place]. repair  1994  . Dyslipidemia   . History of esophageal cancer    s/p transhiatal esophagogastrectomy  . History of lung cancer   . Hypothyroidism   . Pneumonia   . Postoperative atrial fibrillation ([place]) 12/04/2013   Short course of amiodarone, resolved. Postop bypass  . SBO (small bowel obstruction) ([place]) 08/15/2012   History of small bowel obstruction (status post small bowel       resection, lysis of adhesions, incidental appendectomy, and repair       of left diaphragmatic hernia  . Stroke [facility]) 1995   denies residual    Past Surgical History:  Procedure Laterality Date  . BRAIN SURGERY     brain aneursyn  . CHOLECYSTECTOMY    . CORONARY ARTERY BYPASS GRAFT N/A 07/30/2013   Procedure: CORONARY ARTERY BYPASS GRAFTING (CABG) times two on pump using left internal mammary artery and left greater saphenous vein  via endovein harvest.;  Surgeon: Gaye Pollack, MD;  Location: [facility];  Service: Open Heart Surgery;  Laterality: N/A;  . Exploratory laparotomy, lysis of adhesions, reduce of incarcerated small bowel and colon from Glen chest, limited small bowel resection, incidental appendectomy, and then repair of diaphragmatic hernia with AlloDerm mesh  10/27/2009   [place]  . Exploratory laparotomy,exploratory thoracotomy for hemorrhage  02/11/2003   [place]  . LEFT HEART CATHETERIZATION WITH CORONARY ANGIOGRAM N/A 07/30/2013   Procedure: LEFT HEART CATHETERIZATION WITH CORONARY ANGIOGRAM;  Surgeon: Peter M Martinique, MD;   Location: [facility] CATH LAB;  Service: Cardiovascular;  Laterality: N/A;  . Left subclavian Port- A-Cath insertion  03/09/2004   [place] Done  . left upper lobectomy with node dissection  02/24/2010   [place]  . TONSILLECTOMY    . Transhiatal esophagectomy with cholecystectomy, jejunostomy,  pyloroplasty and removal of right subclavian Port-A- Cath     [place]    Family History  Problem Relation Age of Onset  . Aneurysm Mother   . Kidney disease Father   . Heart disease Maternal Uncle   . Heart disease Paternal Uncle   . Aneurysm Maternal Grandmother   . Diabetes Sister     History   Social History  . Marital Status: Married    Spouse Name: N/A    Number of Children: N/A  . Years of Education: N/A   Occupational History  . retired    Social History Main Topics  . Smoking status: Former Smoker    Types: Cigarettes    Quit date: 12/10/2002  . Smokeless tobacco: Never Used  . Alcohol Use: No  . Drug Use: No  . Sexually Active: Not Currently   .   Social History   Tobacco Use  Smoking Status Former Smoker  . Types: Cigarettes  . Last attempt to quit: 12/10/2002  . Years since quitting: 15.6  Smokeless Tobacco Never Used    Social History   Substance and Sexual Activity  Alcohol Use No     No Known Allergies  Current Outpatient Medications  Medication Sig Dispense Refill  . aspirin 81 MG tablet Take 1 tablet (81 mg total) by mouth daily. 30 tablet 0  . atorvastatin (LIPITOR) 40 MG tablet Take 1 tablet (40 mg total) by mouth daily at 6 PM. 90 tablet 2  . carvedilol (COREG) 12.5 MG tablet TAKE 1 TABLET (12.5 MG TOTAL) BY MOUTH 2 (TWO) TIMES DAILY 180 tablet 3  . diphenhydramine-acetaminophen (TYLENOL PM) 25-500 MG TABS tablet Take 2 tablets by mouth at bedtime as needed (for sleep better).    . furosemide (LASIX) 20 MG tablet Take 1 tablet (20 mg total) by mouth daily. 30 tablet 11  . guaiFENesin (MUCINEX) 600 MG 12 hr tablet Take 1,200 mg by mouth 2 (two) times daily as  needed for congestion.    Marland Kitchen lisinopril (PRINIVIL,ZESTRIL) 5 MG tablet TAKE 1 TABLET BY MOUTH EVERY DAY ([name] taking differently: TAKE 1 TABLET (5 MG TOTALLY) BY MOUTH EVERY DAY) 90 tablet 3  . STIOLTO RESPIMAT 2.5-2.5 MCG/ACT AERS INHALE 2 PUFFS INTO Glen LUNGS DAILY. 1 Inhaler 2   No current facility-administered medications for Glen visit.        Review of Systems:     Cardiac Review of Systems: Y or N  Chest Pain [ n  ]  Resting SOB [ n ] Exertional SOB  [n ]  Orthopnea Florencio.Farrier ]   Pedal Edema [  n ]    Palpitations [ ]   Syncope  [ n]   Presyncope [  n]  General Review of Systems: [Y] = yes [  ]=no Constitional: recent weight change [n  ]; anorexia [  ]; fatigue [n  ]; nausea [  ]; night sweats [  ]; fever [  ]; or chills [  ];                                                                                                                                          Dental: poor dentition[  ]; Last Dentist visit:   Eye : blurred vision [  ]; diplopia [   ]; vision changes [  ];  Amaurosis fugax[  ]; Resp: cough [  ];  wheezing[  ];  hemoptysis[  ]; shortness of breath[  ]; paroxysmal nocturnal dyspnea[  ]; dyspnea on exertion[  ]; or orthopnea[  ];  GI:  gallstones[  ], vomiting[  ];  dysphagia[  ]; melena[  ];  hematochezia [  ]; heartburn[  ];   Hx of  Colonoscopy[  ]; GU: kidney stones [  ]; hematuria[  ];   dysuria [  ];  nocturia[  ];  history of     obstruction [  ]; urinary frequency [  ]             Skin: rash, swelling[  ];, hair loss[  ];  peripheral edema[n  ];  or itching[  ]; Musculosketetal: myalgias[  ];  joint swelling[  ];  joint erythema[  ];  joint pain[  ];  back pain[  ];  Heme/Lymph: bruising[  ];  bleeding[  ];  anemia[  ];  Neuro: TIA[  ];  headaches[  ];  stroke[  ];  vertigo[  ];  seizures[  ];   paresthesias[  ];  difficulty walking[  ];  Psych:depression[  ]; anxiety[  ];  Endocrine: diabetes[  ];  thyroid dysfunction[  ];  Immunizations: Flu [  ]; Pneumococcal[   ];  Other:  Physical Exam: BP 106/63 (BP Location: Left Arm, [name] Position: Sitting, Cuff Size: Normal)   Pulse 78   Resp 16   Ht 5\' 10"  (1.778 m)   Wt 145 lb (65.8 kg)   SpO2 97% Comment: ON RA  BMI 20.81 kg/m   General appearance: alert, cooperative and no distress Head: Normocephalic, without obvious abnormality, atraumatic Neck: no adenopathy, no carotid bruit, no JVD, supple, symmetrical, trachea midline and thyroid not enlarged, symmetric, no tenderness/mass/nodules Lymph nodes: Cervical, supraclavicular, and axillary nodes normal. Resp: clear to auscultation bilaterally Cardio: regular rate and rhythm, S1, S2 normal, no murmur, click, rub or gallop GI: soft, non-tender; bowel sounds normal; no masses,  no organomegaly Extremities: extremities normal, atraumatic, no cyanosis or edema Neurologic: Grossly normal   Diagnostic Studies & Laboratory data:     Recent Radiology Findings:  CLINICAL DATA:  Preoperative  assessment for repair of abdominal aortic aneurysm. History of additional aneurysmal dilatation of Glen ascending thoracic aorta.  EXAM: CT ANGIOGRAPHY CHEST, ABDOMEN AND PELVIS  TECHNIQUE: Multidetector CT imaging through Glen chest, abdomen and pelvis was performed using Glen standard protocol during bolus administration of intravenous contrast. Multiplanar reconstructed images and MIPs were obtained and reviewed to evaluate Glen vascular anatomy.  CONTRAST:  35mL ISOVUE-370 IOPAMIDOL (ISOVUE-370) INJECTION 76%  Creatinine was obtained on site at [facility] at [address]  Results: Creatinine 1.2 mg/dL.  Estimated GFR 59 mL/minute  COMPARISON:  Prior CTA on 03/03/2018 and prior CT of [place] abdomen on 07/02/2013  FINDINGS: CTA CHEST FINDINGS  Cardiovascular: Glen thoracic aorta shows stable dimensions. Glen aortic root measures 3.5 cm. Glen ascending thoracic aorta measures 4 cm in maximum diameter. Glen proximal arch measures 3.3  cm and Glen distal arch 2.9 cm. Glen descending thoracic aorta measures 2.4 cm. No evidence of aortic dissection.  Proximal great vessel origins are normally patent. Again noted is a probable significant stenosis at Glen origin of Glen left vertebral artery of greater than 70%. There is calcified plaque at Glen origin of Glen right vertebral artery appearing to cause at least 40-50% stenosis.  Glen heart size is stable. No pericardial fluid. Stable appearance status post CABG.  Mediastinum/Nodes: Superior anterior mediastinal lymph node shows decrease in size from 13 mm to 10 mm in short axis. Precarinal lymph node shows decrease in size from 12 mm to 7 mm in short axis. No enlarged mediastinal or hilar lymph nodes identified. No evidence of axillary lymphadenopathy. Stable appearance status post gastric pull-through after esophagectomy.  Lungs/Pleura: Stable appearance of moderately advanced emphysematous lung disease. Glen partially loculated right pleural effusions seen on Glen prior study has essentially resolved with trace posterior pleural fluid seen near Glen lung base. Status post left upper lobectomy. No pulmonary masses, infiltrates or edema identified. Stable areas of parenchymal scarring bilaterally.  Musculoskeletal: No chest wall abnormality. No acute or significant osseous findings.  Review of Glen MIP images confirms Glen above findings.  CTA ABDOMEN AND PELVIS FINDINGS  VASCULAR  Aorta: Stable infrarenal abdominal aortic aneurysm measuring 4.9 x 4.9 cm and containing significant eccentric mural thrombus. There is very little length of infrarenal neck between Glen left renal artery origin and Glen beginning of aneurysmal dilatation. No evidence of dissection or rupture.  Celiac: Stable 60-70% stenosis at Glen origin of Glen celiac axis. Distal branches show normal patency and anatomy.  SMA: Calcified plaque at Glen superior origin does not cause significant  stenosis.  Renals: Bilateral single renal arteries present with no accessory renal artery supply identified. Scattered calcified plaque in both renal arteries does not cause significant stenosis.  IMA: Glen IMA origin is occluded.  Inflow: 10 mm caliber of Glen proximal right common iliac artery. A combination of calcified and noncalcified plaque causes at least 50% narrowing at Glen origin of Glen right common iliac artery. Glen left common iliac artery measures approximately 8 mm. Calcified plaque present without significant stenosis. Bilateral internal iliac arteries show heavily calcified plaque causing occlusion/subtotal occlusion of Glen right internal iliac artery trunk and likely significant stenosis of Glen left internal iliac artery trunk.  Veins: Delayed imaging of Glen abdomen shows no venous abnormalities.  Review of Glen MIP images confirms Glen above findings.  NON-VASCULAR  Hepatobiliary: No focal liver abnormality is seen. No gallstones, gallbladder wall thickening, or biliary dilatation.  Pancreas: Stable appearance of atrophic pancreas.  Spleen: Normal in  size without focal abnormality.  Adrenals/Urinary Tract: Partially exophytic lesion of Glen posterior interpolar left kidney again noted measuring approximately 1.0 x 1.2 x 1.0 cm. Glen lesion does show increase in density on delayed phase imaging and remains suspicious for a small renal carcinoma. Glen shows no evidence of significant growth since Glen 03/03/2018 scan. Glen lesion was not present on a prior abdominal CT dated 07/02/2013. Stable posterior lower pole right renal cyst has a benign appearance.  Stomach/Bowel: Bowel shows no evidence of obstruction or inflammation. Prior suture line related to partial right-sided colectomy. No free air.  Lymphatic: No enlarged lymph nodes are identified in Glen abdomen or pelvis. There are some small stable para-aortic lymph nodes in Glen retroperitoneum  which do not appear enlarged.  Reproductive: Prostate is unremarkable.  Other: No abdominal wall hernia or abnormality. No abnormal fluid collections.  Musculoskeletal: No acute or significant osseous findings.  Review of Glen MIP images confirms Glen above findings.  IMPRESSION: 1. Stable top-normal size of Glen ascending thoracic aorta measuring 4.0 cm in maximum diameter. 2. Stable appearance of probable significant origin stenosis of Glen left vertebral artery. There also is calcified plaque at Glen origin of Glen right vertebral artery causing at least 40-50% stenosis. Correlation suggested with any clinical symptoms of vertebrobasilar insufficiency. 3. Decreased prominence of some previously visualized mediastinal lymph nodes. A partially loculated right pleural effusion has also essentially resolved. 4. Stable infrarenal abdominal aortic aneurysm measuring 4.9 x 4.9 cm on Glen current study. There is very little measurable infrarenal neck below Glen left renal artery. 5. Stable 60-70% stenosis at Glen origin of Glen celiac axis. 6. Probable moderate stenosis at Glen origin of Glen right common iliac artery of at least 50%. 7. Significant occlusive disease of bilateral internal iliac artery trunks, right greater than left. 8. Stable size of small solid cortical lesion of Glen posterior interpolar left kidney measuring approximately 12 mm in greatest diameter. Glen remains suspicious for a small renal carcinoma and was not present in 2014. Recommend continued surveillance, referral to urology and consideration of MRI characterization.   Electronically Signed   By: Aletta Edouard M.D.   On: 06/23/2018 11:21   Result History     CLINICAL DATA:  Subsequent treatment strategy for lung carcinoma. Small cell lung cancer.  EXAM: NUCLEAR MEDICINE PET SKULL BASE TO THIGH  TECHNIQUE: 6.9 mCi F-18 FDG was injected intravenously. Full-ring PET imaging was performed from  Glen skull base to thigh after Glen radiotracer. CT data was obtained and used for attenuation correction and anatomic localization.  Fasting blood glucose: 103 mg/dl  COMPARISON:  PET-CT 06/17/2013, chest CT 03/03/2018  FINDINGS: Mediastinal blood pool activity: SUV max 2.1  NECK: No hypermetabolic lymph nodes in Glen neck.  Incidental CT findings: none  CHEST: No activity associated with a mild thickening along Glen mediastinal border of Glen RIGHT RIGHT middle lobe described on comparison CT. Glen soft tissue thickening is not well demonstrated appears resolved.  Post gastric pull-up anatomy. No evidence of abnormal activity within Glen gastric pull up or anastomosis  There is a focus of activity in Glen inferior RIGHT lower lobe along Glen diaphragm. There is a curvilinear calcification in Glen same region. Favor focus above pulmonary pleural inflammation (image 115/series 4).  \  Loculated pleural fluid in Glen RIGHT lower lobe is in improved from comparison exam. One focus of loculated fluid does remain measuring 8 mm in depth (image 93/4) with low metabolic activity.  No hypermetabolic mediastinal lymph  nodes  Incidental CT findings: Coronary artery calcification and aortic atherosclerotic calcification.  ABDOMEN/PELVIS: No abnormal hypermetabolic activity within Glen liver, pancreas, adrenal glands, or spleen. No hypermetabolic lymph nodes in Glen abdomen or pelvis.  Incidental CT findings: Aneurysmal dilatation of Glen abdominal aorta to 50 mm by 49 mm not changed from CT 03/03/2018  SKELETON: No focal hypermetabolic activity to suggest skeletal metastasis.  Incidental CT findings: none  IMPRESSION: 1. No evidence of lung cancer recurrence in thorax. Interval resolution soft tissue thickening along Glen mediastinal pleural surface of Glen RIGHT middle lobe. 2. Gastric pull-up anatomy without evidence of malignancy. 3. Focus of metabolic activity in  Glen RIGHT lower lobe associated with pleural calcification is favored inflammatory. 4. Improved loculated pleural fluid in Glen RIGHT lower lobe. 5. Stable infrarenal abdominal aortic aneurysm up to 5 cm.   Electronically Signed   By: Suzy Bouchard M.D.   On: 05/13/2018 09:53  Ct Chest Wo Contrast  Result Date: 08/09/2016 CLINICAL DATA:  Followup lung cancer EXAM: CT CHEST WITHOUT CONTRAST TECHNIQUE: Multidetector CT imaging of Glen chest was performed following Glen standard protocol without IV contrast. COMPARISON:  07/28/2015 FINDINGS: Cardiovascular: Glen heart size appears normal. Aortic atherosclerosis noted. Previous median sternotomy and CABG procedure. Mediastinum/Nodes: Status post esophagectomy with gastric pull through. Glen trachea appears patent and is midline. At her. No mediastinal or hilar adenopathy. Lungs/Pleura: There is no pleural effusion identified. Moderate to advanced changes of centrilobular emphysema identified. Nodular pleural calcifications are noted in Glen right lung base. No suspicious pulmonary nodule or mass identified. Upper Abdomen: There is no suspicious liver abnormality identified. Glen left adrenal gland appears normal. Bilateral nephrolithiasis noted. Glen visualized portions of Glen spleen and pancreas are unremarkable. Musculoskeletal: No aggressive lytic or sclerotic bone lesions identified. IMPRESSION: 1. Stable postsurgical changes involving Glen chest status post esophagectomy and gastric pull-through and left upper lobectomy. 2. No specific findings identified to suggest residual or recurrence of tumor. 3. Emphysema 4. Aortic atherosclerosis. Electronically Signed   By: Kerby Moors M.D.   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 Status post resection. Additional history of esophageal cancer. Subsequent treatment strategy.  EXAM: CT  CHEST WITHOUT CONTRAST  TECHNIQUE: Multidetector CT imaging of Glen chest was performed following Glen standard protocol without IV contrast.  COMPARISON:  CT 07/22/2014  FINDINGS: Mediastinum/Nodes: No axillary supraclavicular adenopathy. Ascending aorta dilated to 42 mm compared to 41 mm at same level on comparison CT of 07/22/2014. No mediastinal lymphadenopathy.  Postop esophagectomy and gastric pull-through anatomy. No obstructing mass identified.  Lungs/Pleura: Postsurgical change in Glen LEFT hemi thorax consistent with LEFT upper lobectomy. No nodularity within Glen LEFT lung. There is bilateral centrilobular emphysema.  Upper abdomen: Status post RIGHT adrenalectomy. LEFT adrenal glands normal. Post cholecystectomy. No acute findings on limited non contrast exam.  Musculoskeletal: No aggressive osseous lesion. Midline sternotomy noted.  IMPRESSION: 1. Stable postsurgical change consistent with LEFT upper lobectomy. No evidence of local lung cancer recurrence. 2. [name] status post esophagectomy and gastric pull-through. No complicating features. 3. Extensive centrilobular emphysema. 4. Stable ascending aorta aneurysm.   Electronically Signed   By: Suzy Bouchard M.D.   On: 07/28/2015 11:32   Dg Chest 1 View  07/13/2013   *RADIOLOGY REPORT*  Clinical Data: Post right thoracentesis  CHEST - 1 VIEW  Comparison: 12/05/2011 chest radiograph, PET CT 07/07/2013  Findings: A bulla at Glen right base is incidentally re-identified. Mild fluid tracking along Glen right minor fissure is noted.  Small left effusion is present.  No pneumothorax.  Left apical clips are present.  Heart size is normal.  Right upper quadrant clips. Emphysematous changes and patchy ill-defined predominately right lower lobe airspace opacity is identified.  IMPRESSION: No pneumothorax after thoracentesis.   Original Report Authenticated By: Conchita Paris, M.D.   Ct Chest Wo Contrast  07/13/2013   *RADIOLOGY REPORT*  Clinical Data: History of  lung carcinoma with right pleural effusion and abnormal pleural enhancement by PET scan.  Evaluation is performed for possible pleural biopsy.  CT CHEST WITHOUT CONTRAST  Technique:  Multidetector CT imaging of Glen chest was performed following Glen standard protocol without IV contrast.  Comparison: PET scan on 07/07/2013  Findings: Imaging was performed in a prone position. There is no evidence of right-sided pleural mass.  A small pleural effusion is present which layers dependently in Glen anterior hemithorax when Glen [name] is in a prone position.  Significant COPD present.  No pulmonary nodules are identified.  IMPRESSION: No evidence of pleural mass.  A small layering right-sided pleural effusion is present. An ultrasound-guided right-sided thoracentesis was subsequently performed.   Original Report Authenticated By: Aletta Edouard, M.D.   Nm Pet Image Restag (ps) Skull Base To Thigh  07/07/2013   *RADIOLOGY REPORT*  Clinical Data: Subsequent treatment strategy for esophageal cancer and lung cancer.  NUCLEAR MEDICINE PET SKULL BASE TO THIGH  Fasting Blood Glucose:  89  Technique:  15.1 mCi F-18 FDG was injected intravenously. CT data was obtained and used for attenuation correction and anatomic localization only.  (Glen was not acquired as a diagnostic CT examination.) Additional exam technical data entered on technologist worksheet.  Comparison:  07/02/2013  Findings:  Neck: No hypermetabolic lymph nodes in Glen neck.  Chest:  Postoperative change from esophagectomy and gastric pull- through identified.  A left upper lobectomy has also been performed.  There has been interval decrease in volume of bilateral pleural effusions.  There is a small focus of mild increased FDG uptake localizing to Glen previously described loculated right pleural effusion.  Glen SUV max within Glen area is equal to 3.0, image 134.  No hypermetabolic mediastinal or hilar nodes.  No suspicious pulmonary nodules on Glen CT scan.   Abdomen/Pelvis:  No abnormal hypermetabolic activity within Glen liver, pancreas, adrenal glands, or spleen.  No hypermetabolic lymph nodes in Glen abdomen or pelvis. Again noted is an abdominal aortic aneurysm.  Glen maximum AP dimension is equal to 3.6, image 172/series 2.  Skeleton:  No focal hypermetabolic activity to suggest skeletal metastasis.  IMPRESSION:  1.  There is a small focus of mild increased uptake localizing to Glen partially loculated right pleural effusion.  Glen is nonspecific and may reflect inflammation or infection.  Malignant pleural effusion cannot be excluded.  Consider further evaluation with diagnostic thoracentesis. 2.  Infrarenal abdominal aortic aneurysm.   Original Report Authenticated By: Kerby Moors, M.D.     Ct Chest W Contrast  07/02/2013   *RADIOLOGY REPORT*  Clinical Data: History of esophageal cancer with esophagectomy and gastric pull-through.  CT CHEST WITH CONTRAST  Technique:  Multidetector CT imaging of Glen chest was performed following Glen standard protocol during bolus administration of intravenous contrast.  Contrast: 115mL OMNIPAQUE IOHEXOL 300 MG/ML  SOLN  Comparison: 05/26/2012  Findings: Glen lungs are well-aerated bilaterally with emphysematous changes noted. A small right-sided pleural effusion  is identified with some associated right lower lobe atelectasis.  Glen is new from prior exam.  No new focal infiltrate or sizable parenchymal nodule is seen.  There are changes consistent with Glen [name]'s given clinical history of esophagectomy and gastric pull-through.  An air and fluid is noted within Glen stomach.  No obstructive changes are seen.  Glen thoracic aorta and pulmonary artery are less than stable.  Mild dilatation of Glen proximal ascending aorta is again seen and stable.  Heavy coronary calcifications are again seen. No hilar or mediastinal adenopathy is identified.  Scanning into Glen upper abdomen reveals postsurgical changes.  IMPRESSION: Changes  consistent with Glen known history of esophagectomy and gastric pull-through.  Glen overall appearance is stable.  No recurrent disease is seen.  Stable dilatation of Glen ascending aorta.  Stable emphysematous changes.   Original Report Authenticated By: Inez Catalina, M.D.   Ct Abdomen W Contrast  07/02/2013   *RADIOLOGY REPORT*  Clinical Data: Follow-up for Glen soft medial and lung cancer.  CT ABDOMEN WITH CONTRAST  Technique:  Multidetector CT imaging of Glen abdomen was performed following Glen standard protocol during bolus administration of intravenous contrast.  Contrast:  100 ml of Omnipaque-300  Comparison: May 26, 2012  CT of Glen chest:  Findings: There are extensive emphysematous changes of bilateral lungs.  Stable postsurgical changes are identified involving Glen left hemithorax.  There are no pulmonary nodules or mass.  There is interval developed minimal left pleural effusion and moderate right pleural effusion.  Along Glen medial aspect of Glen right pleural effusion, there is suggestion of pleural based 1 cm nodular enhancement best seen on series 3 image 46.  There is no mediastinal or hilar lymphadenopathy.  Glen [name] is status post prior gastric pull-through without interval change.  Glen heart size is normal.  There is no pericardial effusion.  Images of Glen bones demonstrate no focal discrete lytic or blastic lesion.  Impression:  Interval developed bilateral pleural effusions, right greater than left.  Along Glen medial aspect of Glen right pleural effusion, there is suggestion of a pleural based 1 cm nodule enhancement new since prior exam.  Metastasis is not excluded. Further evaluation with right thoracentesis with cytology may be helpful if clinically indicated.  CT of Glen abdomen:  Findings:  Images of Glen abdomen demonstrate normal liver.  Glen [name] is status post prior cholecystectomy.  There is postsurgical common bile duct dilatation measuring 1.3 cm unchanged compared prior exam.  Glen  spleen, pancreas, left adrenal gland are normal.  There are bilateral kidneys cysts unchanged.  There is no hydronephrosis bilaterally.  There are stable postsurgical changes in Glen right retroperitoneum.  There are small stable small lymph nodes in Glen retroperitoneum periaortic region unchanged.  There is stable infrarenal abdominal aorta measuring 4.1 cm in diameter.  Glen visualized bowel is normal.  Images of Glen bones demonstrate no definite focal lytic or blastic lesions.  IMPRESSION: Stable abdomen CT without change compared prior exam.  No CT evidence of abdominal metastatic disease.   Original Report Authenticated By: Abelardo Diesel, M.D.     Recent Lab Findings: Lab Results  Component Value Date   WBC 9.9 03/10/2018   HGB 11.5 (L) 03/10/2018   HCT 35.5 (L) 03/10/2018   PLT 200 03/10/2018   GLUCOSE 114 (H) 03/10/2018   CHOL 92 07/31/2013   TRIG 73 07/31/2013   HDL 35 (L) 07/31/2013   LDLCALC 42 07/31/2013   ALT 13 (L) 03/10/2018  AST 19 03/10/2018   NA 138 03/10/2018   K 4.0 03/10/2018   CL 102 03/10/2018   CREATININE 1.06 03/10/2018   BUN 25 (H) 03/10/2018   CO2 26 03/10/2018   TSH 1.766 07/31/2013   INR 1.52 (H) 07/31/2013   HGBA1C 6.5 (H) 07/30/2013    Diagnosis PLEURAL FLUID, RIGHT NO MALIGNANT CELLS IDENTIFIED. Mali RUND DO Pathologist, Electronic Signature (Case signed 07/14/2013) Specimen Clinical Information History of lung carcinoma, Right pleural effusion Source Pleural Fluid, Right Gross Specimen: Received is/are 3 syringes totaling 180cc's of goldfluid.(GW:gw)  Assessment / Plan:   1/ Partially exophytic lesion of Glen posterior interpolar left kidney again noted measuring approximately 1.0 x 1.2 x 1.0 cm.  Not present in 2014-Glen [name] was not sure if Glen [name] had ever been seen by urology.  We will check with urologist and see if Glen [name] is been seen and/or determine if Glen [name] needs anything other than serial CT scans which she is getting to measure Glen size of his  abdominal aorta.  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 3/   Glen [name] has a history of cerebral aneurysm repaired 1999, both his mother and grandmother died at an early age from cerebral aneurysms.  We will plan to see him back as needed, continues to be followed closely in [place] vascular office   Grace Isaac MD      [address] [place] [address] Office [phone]   Beeper [phone]  07/25/2018 11:41 AM

## 2018-07-25 ENCOUNTER — Encounter: Payer: Self-pay | Admitting: Cardiothoracic Surgery

## 2018-07-25 ENCOUNTER — Ambulatory Visit: Payer: Medicare Other | Admitting: Cardiothoracic Surgery

## 2018-07-25 ENCOUNTER — Other Ambulatory Visit: Payer: Self-pay

## 2018-07-25 VITALS — BP 106/63 | HR 78 | Resp 16 | Ht 70.0 in | Wt 145.0 lb

## 2018-07-25 DIAGNOSIS — C3412 Malignant neoplasm of upper lobe, left bronchus or lung: Secondary | ICD-10-CM

## 2018-07-25 DIAGNOSIS — Z902 Acquired absence of lung [part of]: Secondary | ICD-10-CM

## 2018-07-25 DIAGNOSIS — I712 Thoracic aortic aneurysm, without rupture, unspecified: Secondary | ICD-10-CM

## 2018-07-25 DIAGNOSIS — Z09 Encounter for follow-up examination after completed treatment for conditions other than malignant neoplasm: Secondary | ICD-10-CM

## 2018-07-25 DIAGNOSIS — Z8501 Personal history of malignant neoplasm of esophagus: Secondary | ICD-10-CM | POA: Diagnosis not present

## 2018-07-31 ENCOUNTER — Other Ambulatory Visit: Payer: Self-pay | Admitting: Family Medicine

## 2018-07-31 DIAGNOSIS — N2889 Other specified disorders of kidney and ureter: Secondary | ICD-10-CM

## 2018-07-31 IMAGING — PT NM PET TUM IMG RESTAG (PS) SKULL BASE T - THIGH
1 of 8 series · 1 of 25 positions shown · non-contrast
Comparison: PET-CT 06/17/2013, chest CT 03/03/2018

CLINICAL DATA: Subsequent treatment strategy for lung carcinoma.
Small cell lung cancer.

EXAM:
NUCLEAR MEDICINE PET SKULL BASE TO THIGH
TECHNIQUE: 6.9 mCi F-18 FDG was injected intravenously. Full-ring PET imaging
was performed from the skull base to thigh after the radiotracer. CT
data was obtained and used for attenuation correction and anatomic
localization.
Fasting blood glucose: 103 mg/dl

[Series 4: ct sk_thigh 5.0 b31f · axial · 5.0mm · 0.98mm/px · 1 of 235 slices shown]
[im 235/235  brain]
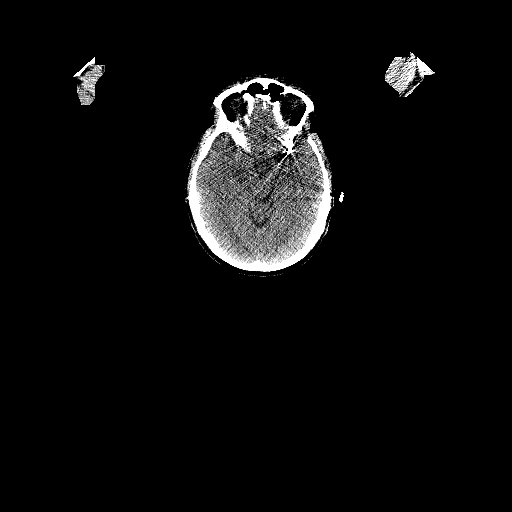

[1 of 25 positions shown; findings below may reference images not displayed]

FINDINGS: Mediastinal blood pool activity: SUV max

NECK: No hypermetabolic lymph nodes in the neck.

Incidental CT findings: none

CHEST: No activity associated with a mild thickening along the
mediastinal border of the RIGHT RIGHT middle lobe described on
comparison CT. The soft tissue thickening is not well demonstrated
appears resolved.

Post gastric pull-up anatomy. No evidence of abnormal activity
within the gastric pull up or anastomosis

There is a focus of activity in the inferior RIGHT lower lobe along
the diaphragm. There is a curvilinear calcification in the same
region. Favor focus above pulmonary pleural inflammation (image
115/series 4).

Loculated pleural fluid in the RIGHT lower lobe is in improved from
comparison exam. One focus of loculated fluid does remain measuring
8 mm in depth (image 93/4) with low metabolic activity.

No hypermetabolic mediastinal lymph nodes

Incidental CT findings: Coronary artery calcification and aortic
atherosclerotic calcification.

ABDOMEN/PELVIS: No abnormal hypermetabolic activity within the
liver, pancreas, adrenal glands, or spleen. No hypermetabolic lymph
nodes in the abdomen or pelvis.

Incidental CT findings: Aneurysmal dilatation of the abdominal aorta
to 50 mm by 49 mm not changed from CT 03/03/2018

SKELETON: No focal hypermetabolic activity to suggest skeletal
metastasis.

Incidental CT findings: none
IMPRESSION: 1. No evidence of lung cancer recurrence in thorax. Interval
resolution soft tissue thickening along the mediastinal pleural
surface of the RIGHT middle lobe.
2. Gastric pull-up anatomy without evidence of malignancy.
3. Focus of metabolic activity in the RIGHT lower lobe associated
with pleural calcification is favored inflammatory.
4. Improved loculated pleural fluid in the RIGHT lower lobe.
5. Stable infrarenal abdominal aortic aneurysm up to 5 cm.

## 2018-08-06 ENCOUNTER — Ambulatory Visit
Admission: RE | Admit: 2018-08-06 | Discharge: 2018-08-06 | Disposition: A | Discharge status: Home / Self Care | Payer: Medicare Other | Source: Ambulatory Visit | Attending: Family Medicine | Admitting: Family Medicine

## 2018-08-06 DIAGNOSIS — N2889 Other specified disorders of kidney and ureter: Secondary | ICD-10-CM

## 2018-08-06 MED ORDER — IOPAMIDOL (ISOVUE-300) INJECTION 61%
100.0000 mL | Freq: Once | INTRAVENOUS | Status: AC | PRN
  Administered 2018-08-06: 100 mL via INTRAVENOUS

## 2018-08-15 ENCOUNTER — Encounter: Payer: Self-pay | Admitting: *Deleted

## 2018-08-15 NOTE — Progress Notes (Signed)
Mr. Glen Green was referred to [facility] regarding a left renal lesion. This was documented on CTA of the ABD 06/23/18. He was seen by Dr. Shaune Leeks on 08/14/18. His renal lesion will be monitored every 6 months by Dr. Shaune Leeks with review of his CTA ABD as ordered by Dr Harold Barban, vasular surgery, who is monitoring his aortic aneurysm.

## 2018-09-10 IMAGING — CT CT ANGIO CHEST
2 of 7 series · 10 of 36 positions shown · IV contrast (iopamidol)
Comparison: Prior CTA on 03/03/2018 and prior CT of the abdomen on
07/02/2013

CLINICAL DATA: Preoperative assessment for repair of abdominal
aortic aneurysm. History of additional aneurysmal dilatation of the
ascending thoracic aorta.

EXAM:
CT ANGIOGRAPHY CHEST, ABDOMEN AND PELVIS
TECHNIQUE: Multidetector CT imaging through the chest, abdomen and pelvis was
performed using the standard protocol during bolus administration of
intravenous contrast. Multiplanar reconstructed images and MIPs were
obtained and reviewed to evaluate the vascular anatomy.
CONTRAST:  75mL RASDU9-NAR IOPAMIDOL (RASDU9-NAR) INJECTION 76%
Creatinine was obtained on site at [HOSPITAL] at [REDACTED].
Results: Creatinine 1.2 mg/dL.  Estimated GFR 59 mL/minute

[Series 5: cta cap aneurysm 2.00 bv36 s3 ax axial arterial · axial · arterial · 0.48mm/px · z∈[+1167,+1783]mm · 9 of 366 slices shown]
[im 29/366  lung]
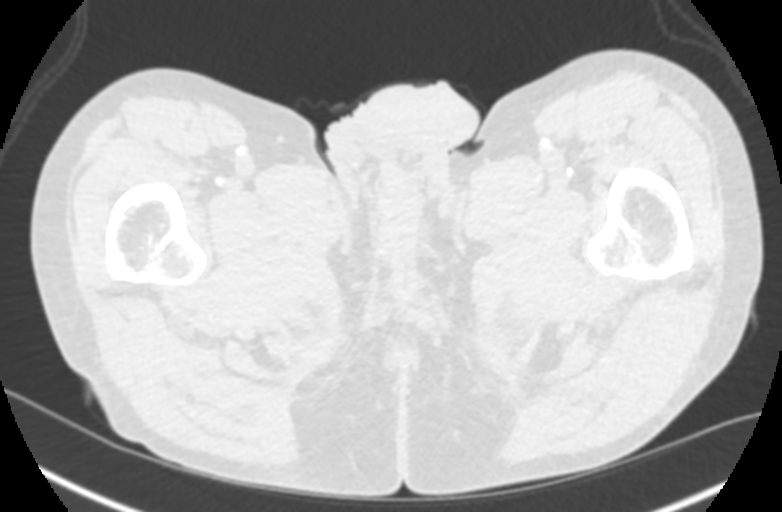
[im 85/366  mediastinal]
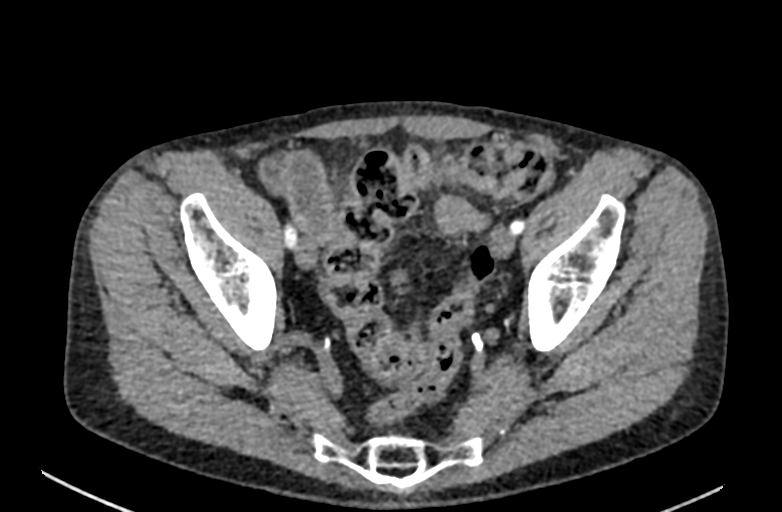
[im 113/366  lung]
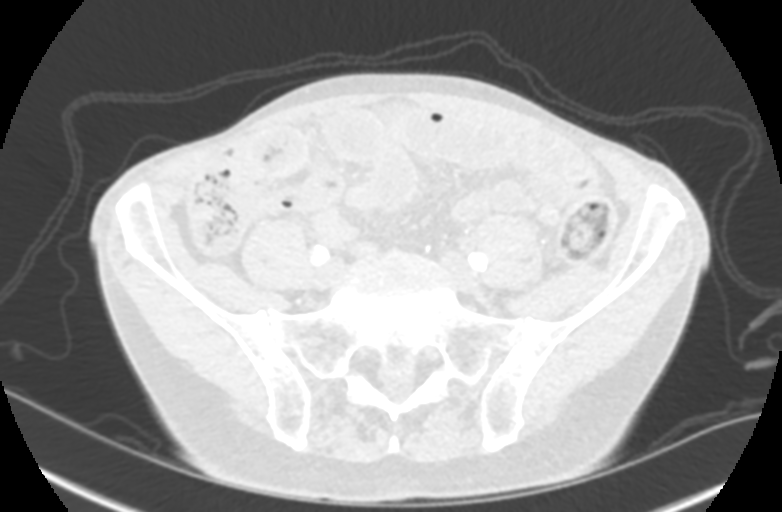
[im 141/366  mediastinal]
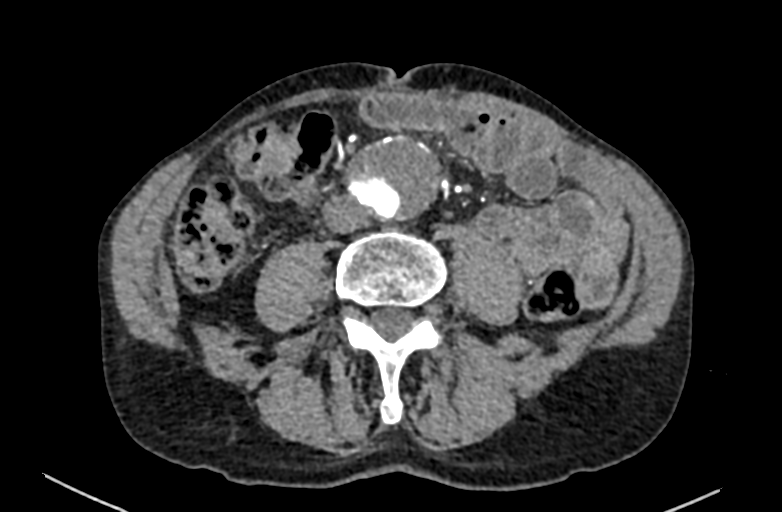
[im 197/366  lung]
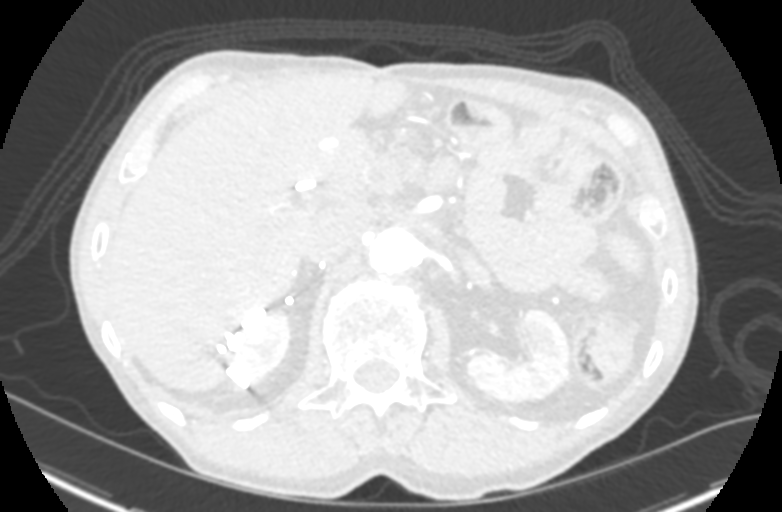
[im 225/366  mediastinal]
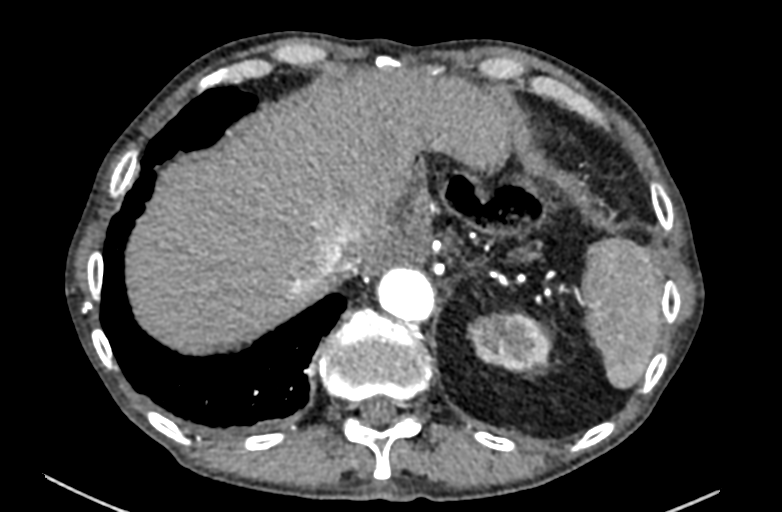
[im 253/366  lung]
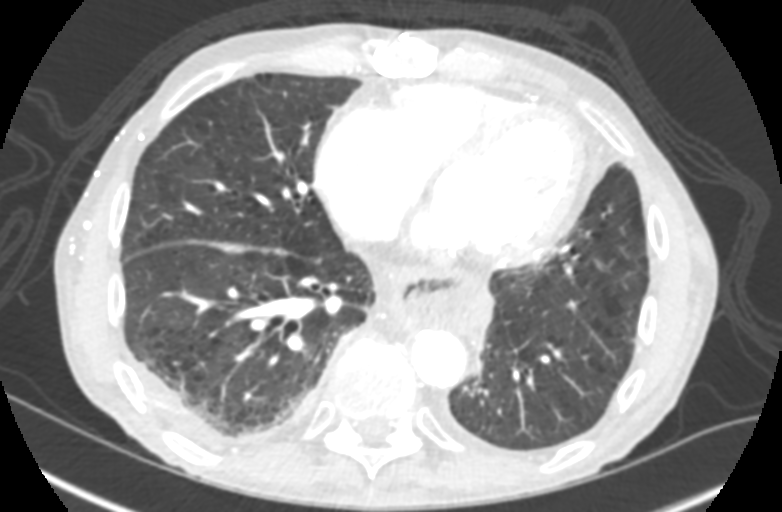
[im 309/366  mediastinal]
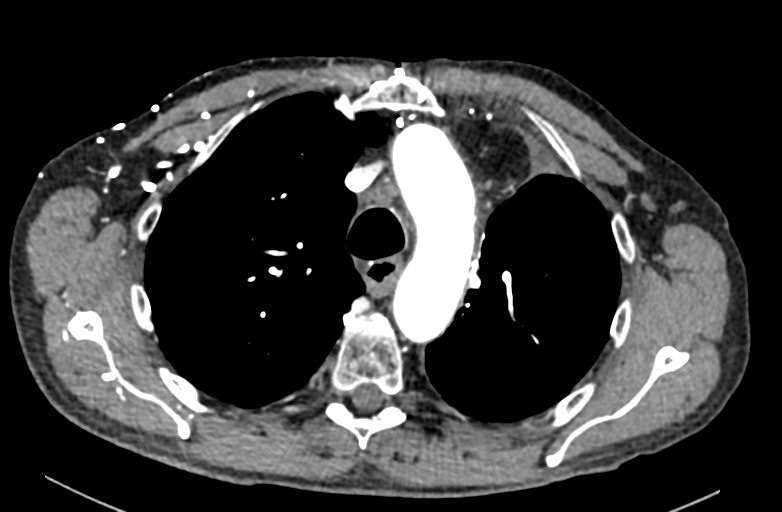
[im 337/366  lung]
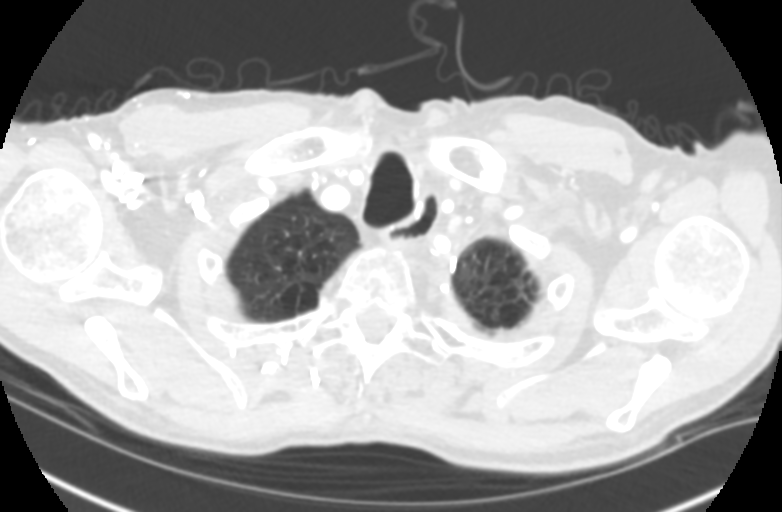

[Series 10: cta cap aneurysm 2.00 bv36 s3 cor cor st · coronal · 0.74mm/px · 1 of 123 slices shown]
[im 62/123  mediastinal]
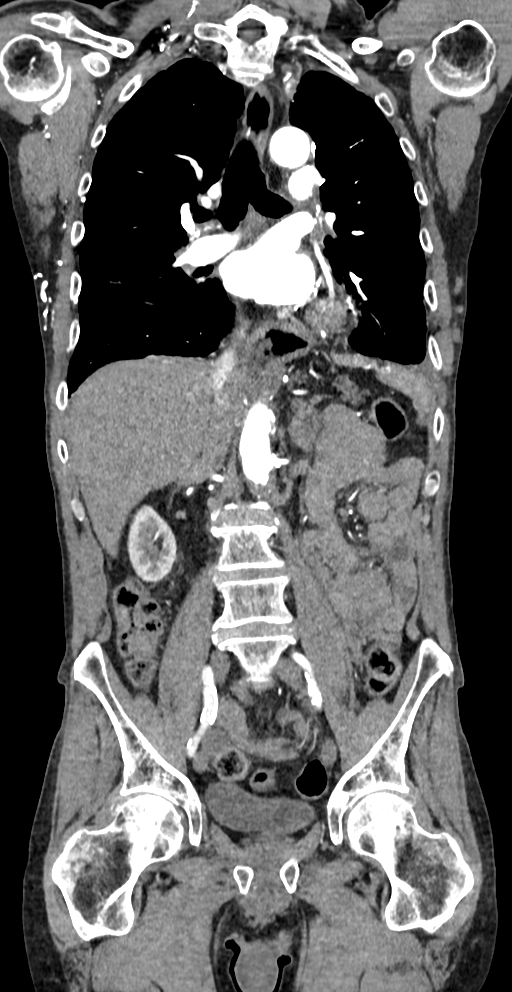

[10 of 36 positions shown; findings below may reference images not displayed]

FINDINGS: CTA CHEST FINDINGS

Cardiovascular: The thoracic aorta shows stable dimensions. The
aortic root measures 3.5 cm. The ascending thoracic aorta measures 4
cm in maximum diameter. The proximal arch measures 3.3 cm and the
distal arch 2.9 cm. The descending thoracic aorta measures 2.4 cm.
No evidence of aortic dissection.

Proximal great vessel origins are normally patent. Again noted is a
probable significant stenosis at the origin of the left vertebral
artery of greater than 70%. There is calcified plaque at the origin
of the right vertebral artery appearing to cause at least 40-50%
stenosis.

The heart size is stable. No pericardial fluid. Stable appearance
status post CABG.

Mediastinum/Nodes: Superior anterior mediastinal lymph node shows
decrease in size from 13 mm to 10 mm in short axis. Precarinal lymph
node shows decrease in size from 12 mm to 7 mm in short axis. No
enlarged mediastinal or hilar lymph nodes identified. No evidence of
axillary lymphadenopathy. Stable appearance status post gastric
pull-through after esophagectomy.

Lungs/Pleura: Stable appearance of moderately advanced emphysematous
lung disease. The partially loculated right pleural effusions seen
on the prior study has essentially resolved with trace posterior
pleural fluid seen near the lung base. Status post left upper
lobectomy. No pulmonary masses, infiltrates or edema identified.
Stable areas of parenchymal scarring bilaterally.

Musculoskeletal: No chest wall abnormality. No acute or significant
osseous findings.

Review of the MIP images confirms the above findings.

CTA ABDOMEN AND PELVIS FINDINGS

VASCULAR

Aorta: Stable infrarenal abdominal aortic aneurysm measuring 4.9 x
4.9 cm and containing significant eccentric mural thrombus. There is
very little length of infrarenal neck between the left renal artery
origin and the beginning of aneurysmal dilatation. No evidence of
dissection or rupture.

Celiac: Stable 60-70% stenosis at the origin of the celiac axis.
Distal branches show normal patency and anatomy.

SMA: Calcified plaque at the superior origin does not cause
significant stenosis.

Renals: Bilateral single renal arteries present with no accessory
renal artery supply identified. Scattered calcified plaque in both
renal arteries does not cause significant stenosis.

IMA: The IMA origin is occluded.

Inflow: 10 mm caliber of the proximal right common iliac artery. A
combination of calcified and noncalcified plaque causes at least 50%
narrowing at the origin of the right common iliac artery. The left
common iliac artery measures approximately 8 mm. Calcified plaque
present without significant stenosis. Bilateral internal iliac
arteries show heavily calcified plaque causing occlusion/subtotal
occlusion of the right internal iliac artery trunk and likely
significant stenosis of the left internal iliac artery trunk.

Veins: Delayed imaging of the abdomen shows no venous abnormalities.

Review of the MIP images confirms the above findings.

NON-VASCULAR

Hepatobiliary: No focal liver abnormality is seen. No gallstones,
gallbladder wall thickening, or biliary dilatation.

Pancreas: Stable appearance of atrophic pancreas.

Spleen: Normal in size without focal abnormality.

Adrenals/Urinary Tract: Partially exophytic lesion of the posterior
interpolar left kidney again noted measuring approximately 1.0 x
x 1.0 cm. This lesion does show increase in density on delayed phase
imaging and remains suspicious for a small renal carcinoma. This
shows no evidence of significant growth since the 03/03/2018 scan.
The lesion was not present on a prior abdominal CT dated 07/02/2013.
Stable posterior lower pole right renal cyst has a benign
appearance.

Stomach/Bowel: Bowel shows no evidence of obstruction or
inflammation. Prior suture line related to partial right-sided
colectomy. No free air.

Lymphatic: No enlarged lymph nodes are identified in the abdomen or
pelvis. There are some small stable para-aortic lymph nodes in the
retroperitoneum which do not appear enlarged.

Reproductive: Prostate is unremarkable.

Other: No abdominal wall hernia or abnormality. No abnormal fluid
collections.

Musculoskeletal: No acute or significant osseous findings.

Review of the MIP images confirms the above findings.
IMPRESSION: 1. Stable top-normal size of the ascending thoracic aorta measuring
4.0 cm in maximum diameter.
2. Stable appearance of probable significant origin stenosis of the
left vertebral artery. There also is calcified plaque at the origin
of the right vertebral artery causing at least 40-50% stenosis.
Correlation suggested with any clinical symptoms of vertebrobasilar
insufficiency.
3. Decreased prominence of some previously visualized mediastinal
lymph nodes. A partially loculated right pleural effusion has also
essentially resolved.
4. Stable infrarenal abdominal aortic aneurysm measuring 4.9 x
cm on the current study. There is very little measurable infrarenal
neck below the left renal artery.
5. Stable 60-70% stenosis at the origin of the celiac axis.
6. Probable moderate stenosis at the origin of the right common
iliac artery of at least 50%.
7. Significant occlusive disease of bilateral internal iliac artery
trunks, right greater than left.
8. Stable size of small solid cortical lesion of the posterior
interpolar left kidney measuring approximately 12 mm in greatest
diameter. This remains suspicious for a small renal carcinoma and
was not present in [id number]. Recommend continued surveillance, referral
to urology and consideration of MRI characterization.

## 2018-10-24 IMAGING — CT CT ABDOMEN WO/W CM
3 of 13 series · 10 of 46 positions shown, 16 images · IV contrast (iopamidol)
Comparison: CT scan 03/03/2018, PET-CT 05/13/2018 and CT scan
06/23/2018.

CLINICAL DATA: Followup left renal lesion. History of lung cancer
and esophageal cancer.

EXAM:
CT ABDOMEN WITHOUT AND WITH CONTRAST
TECHNIQUE: Multidetector CT imaging of the abdomen was performed following the
standard protocol before and following the bolus administration of
intravenous contrast.
CONTRAST:  100mL DDJYFI-A77 IOPAMIDOL (DDJYFI-A77) INJECTION 61%

[Series 2: renal without 2.00 br40 s3 ax axial without · axial · non-contrast · 0.46mm/px · z∈[+1553,+1655]mm · 3 of 128 slices shown]
[im 26/128  soft-tissue]
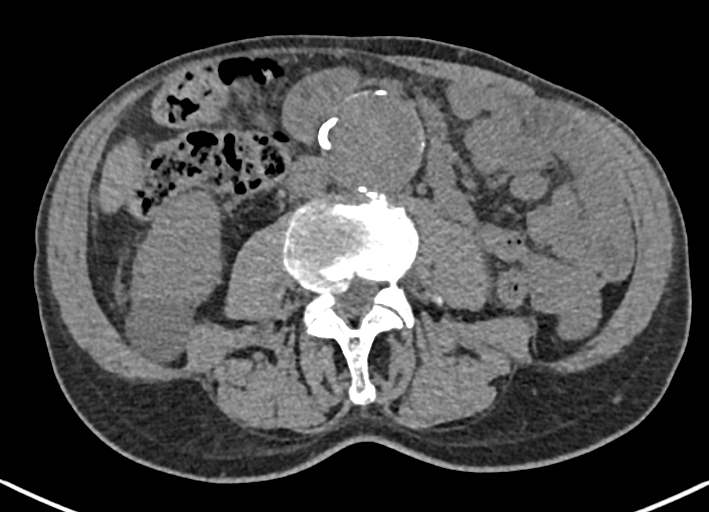
[im 51/128  soft-tissue]
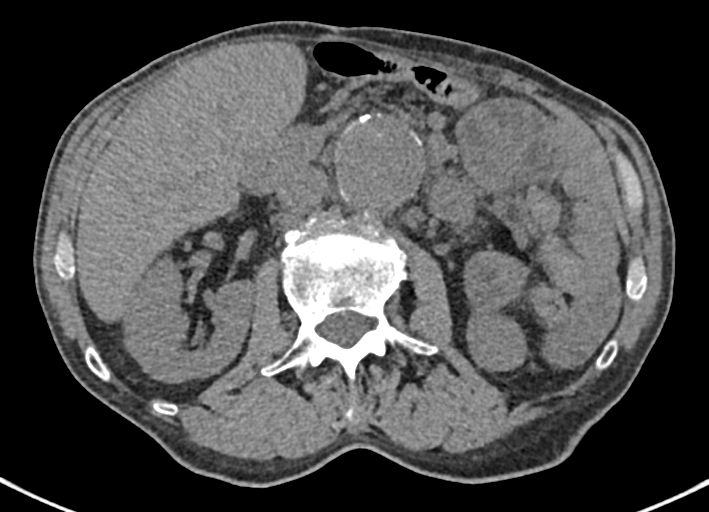
[im 77/128  soft-tissue]
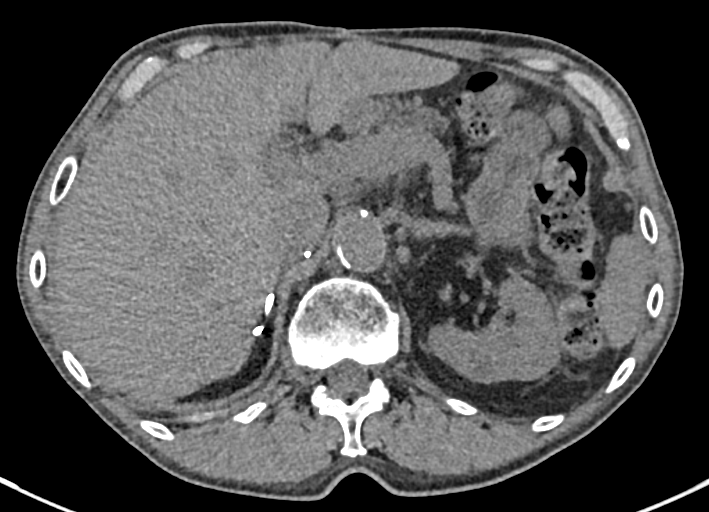

[Series 10: renal arterial 2.00 br36 s3 ax axial arterial · axial · arterial · 0.67mm/px · z∈[+1522,+1708]mm · 5 of 141 slices shown, 10 images]
[im 24/141  soft-tissue]
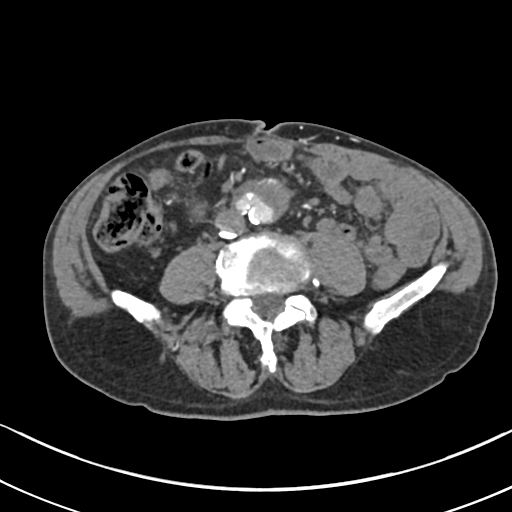
[im 24/141  bone]
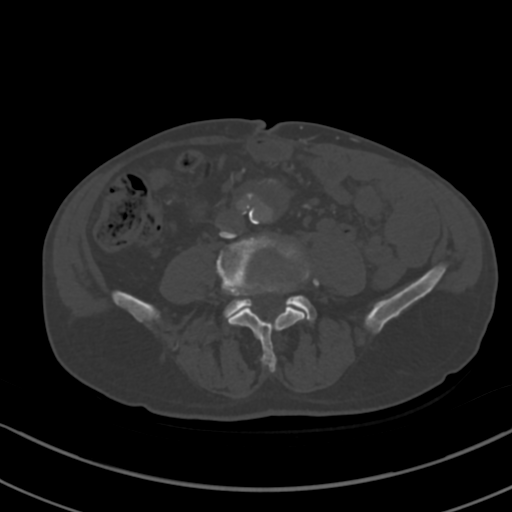
[im 47/141  soft-tissue]
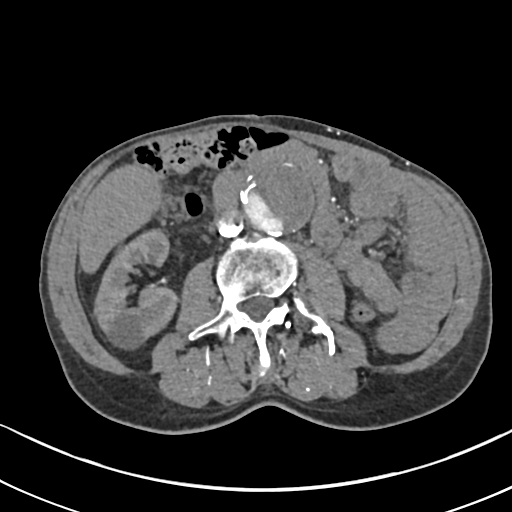
[im 47/141  lung]
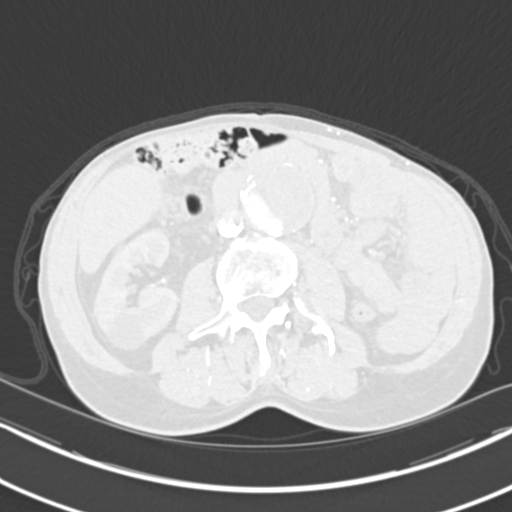
[im 71/141  soft-tissue]
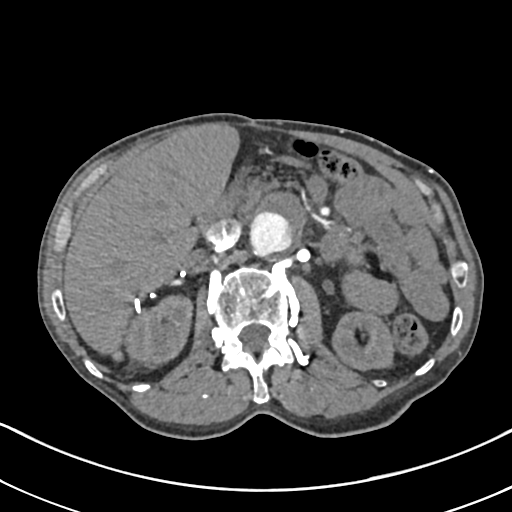
[im 71/141  lung]
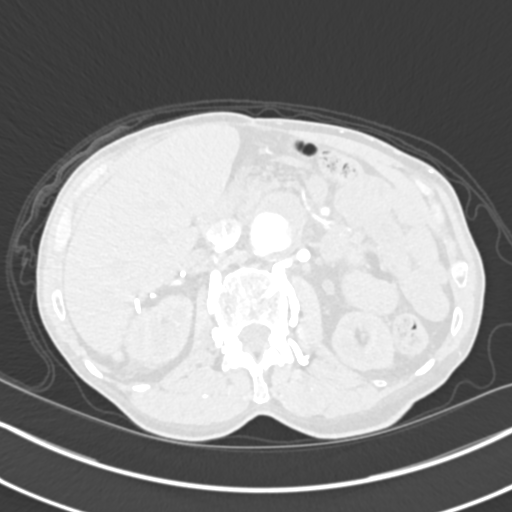
[im 94/141  soft-tissue]
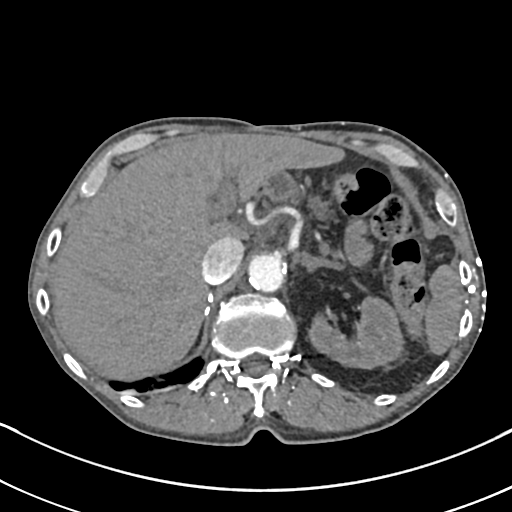
[im 94/141  lung]
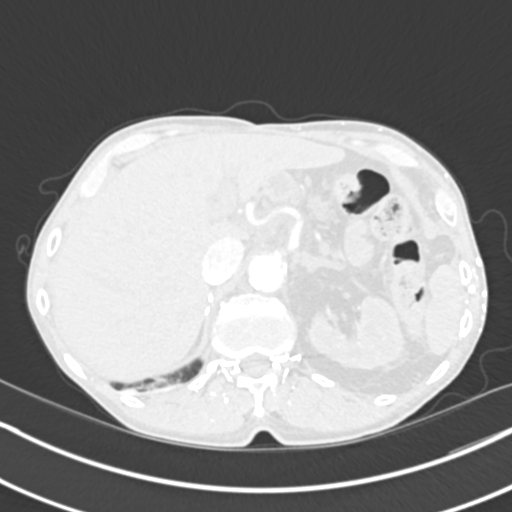
[im 117/141  soft-tissue]
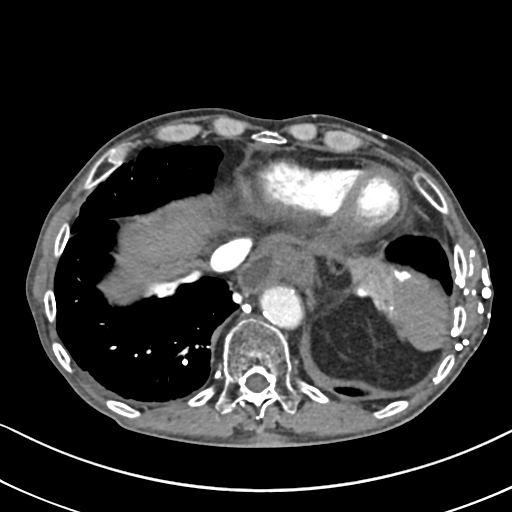
[im 117/141  lung]
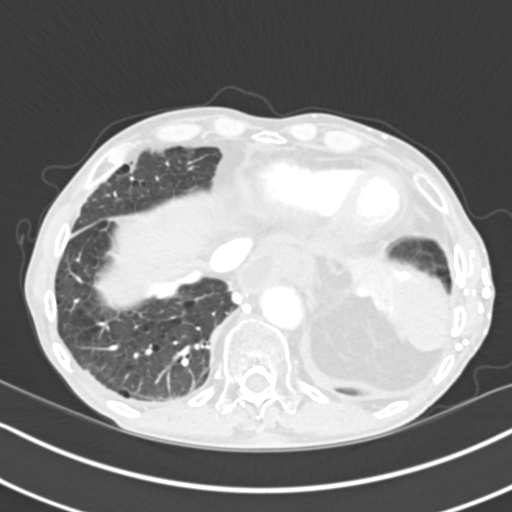

[Series 12: renal arterial 2.00 br36 s3 cor cor arterial · coronal · arterial · 0.55mm/px · 2 of 160 slices shown, 3 images]
[im 54/160  soft-tissue]
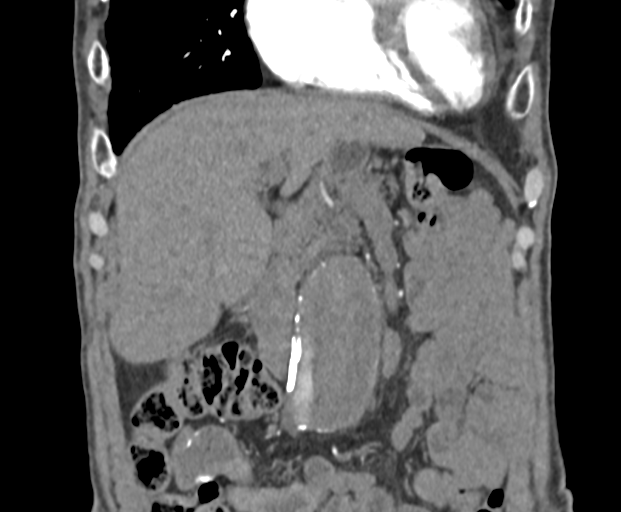
[im 54/160  bone]
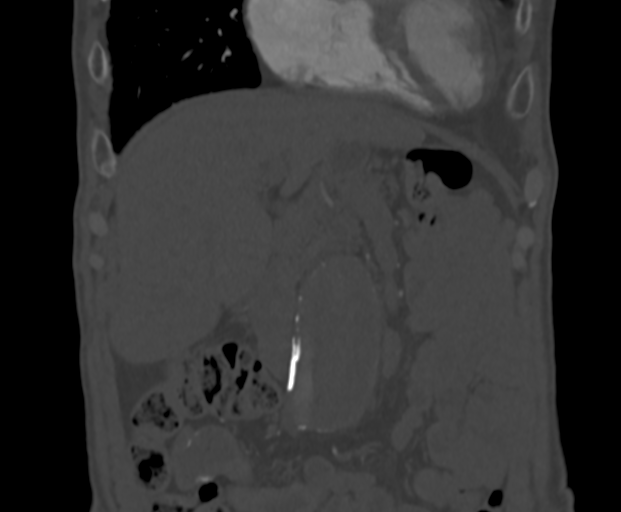
[im 107/160  soft-tissue]
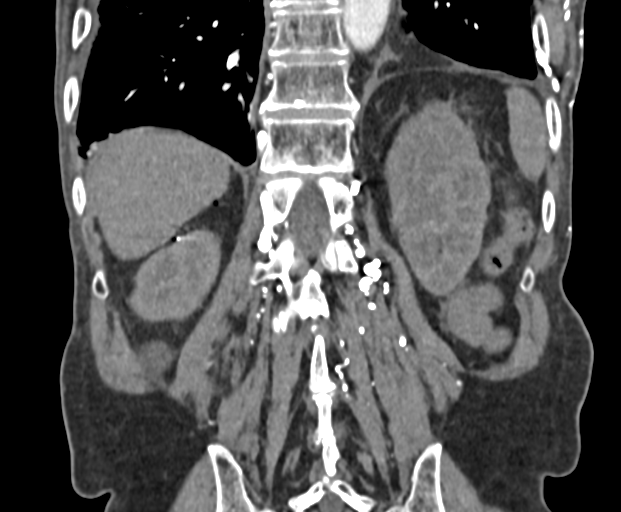

[10 of 46 positions shown; findings below may reference images not displayed]

FINDINGS: Lower chest: Stable emphysematous changes and pulmonary scarring.
Small loculated pleural fluid collection at the right lung base. The
heart is within normal limits in size and stable. Surgical changes
from a gastric pull-through procedure. No worrisome pulmonary
nodules to suggest pulmonary metastatic disease.

Hepatobiliary: No focal hepatic lesions or intrahepatic biliary
dilatation. The gallbladder is surgically absent. No significant
common bile duct dilatation.

Pancreas: No mass, inflammation or ductal dilatation. Stable
significant pancreatic atrophy.

Spleen: Surgical changes involving the spleen but no splenic
lesions.

Adrenals/Urinary Tract: The adrenal glands are unremarkable and
stable.

11 mm exophytic enhancing lesion projecting off the posterior cortex
of the left kidney in the midpole region. This measures
approximately 50 Hounsfield units precontrast and a maximum of 128
Hounsfield units postcontrast. This is consistent with a small renal
cell carcinoma. It also, in retrospect, showed hypermetabolism on
the prior PET-CT.

Stomach/Bowel: Surgical changes involving the stomach related to the
gastric pull-through procedure for the patient's esophageal cancer.
The visualized small bowel and colon are grossly normal. No acute
inflammatory changes, mass lesions or obstructive findings.

Vascular/Lymphatic: Extensive vascular disease better described on
the recent CT a from 06/23/2018. Stable 4.9 x 4.8 infrarenal
abdominal aortic aneurysm with extensive mural thrombus.

No mesenteric or retroperitoneal mass or lymphadenopathy. Small
scattered lymph nodes are stable.

Other: No ascites or abdominal wall hernia.

Musculoskeletal: No significant bony findings. Mild-to-moderate
osteoporosis.
IMPRESSION: 1. Stable appearing 11 mm enhancing left renal lesion consistent
with a small renal cell carcinoma. In retrospect this was also
hypermetabolic on the prior PET-CT.
2. No acute abdominal findings or lymphadenopathy.
3. Stable bilateral renal cysts.
4. Surgical changes from a gastric pull-through procedure. No
findings for recurrent tumor.
5. Advanced atherosclerotic disease involving the aorta and branch
vessels. Stable 4.9 x 4.8 cm infrarenal abdominal aortic aneurysm.

## 2018-11-12 ENCOUNTER — Other Ambulatory Visit: Payer: Self-pay | Admitting: *Deleted

## 2018-11-12 MED ORDER — TIOTROPIUM BROMIDE-OLODATEROL 2.5-2.5 MCG/ACT IN AERS: 2.0000 | INHALATION_SPRAY | Freq: Every day | RESPIRATORY_TRACT | 0 refills | Status: DC

## 2018-12-05 ENCOUNTER — Other Ambulatory Visit: Payer: Self-pay | Admitting: Cardiology

## 2018-12-06 ENCOUNTER — Other Ambulatory Visit: Payer: Self-pay | Admitting: Cardiology

## 2018-12-09 ENCOUNTER — Other Ambulatory Visit: Payer: Self-pay | Admitting: *Deleted

## 2018-12-12 ENCOUNTER — Other Ambulatory Visit: Payer: Self-pay

## 2018-12-15 ENCOUNTER — Ambulatory Visit: Payer: Medicare Other | Admitting: Cardiology

## 2018-12-15 ENCOUNTER — Encounter: Payer: Self-pay | Admitting: Cardiology

## 2018-12-15 VITALS — BP 106/60 | HR 76 | Ht 70.0 in | Wt 137.8 lb

## 2018-12-15 DIAGNOSIS — I10 Essential (primary) hypertension: Secondary | ICD-10-CM | POA: Diagnosis not present

## 2018-12-15 DIAGNOSIS — R935 Abnormal findings on diagnostic imaging of other abdominal regions, including retroperitoneum: Secondary | ICD-10-CM | POA: Diagnosis not present

## 2018-12-15 DIAGNOSIS — I5022 Chronic systolic (congestive) heart failure: Secondary | ICD-10-CM

## 2018-12-15 NOTE — Patient Instructions (Signed)
Medication Instructions:  The current medical regimen is effective;  continue present plan and medications.  If you need a refill on your cardiac medications before your next appointment, please call your pharmacy.   Follow-Up: At [facility], you and your health needs are our priority.  As part of our continuing mission to provide you with exceptional heart care, we have created designated Provider Care Teams.  These Care Teams include your primary Cardiologist (physician) and Advanced Practice Providers (APPs -  Physician Assistants and Nurse Practitioners) who all work together to provide you with the care you need, when you need it. You will need a follow up appointment in 12 months.  Please call our office 2 months in advance to schedule this appointment.  You may see Mark Skains, MD or one of the following Advanced Practice Providers on your designated Care Team:   Lori Gerhardt, NP Laura Ingold, NP . Jill McDaniel, NP  Thank you for choosing [facility]!!      

## 2018-12-15 NOTE — Progress Notes (Signed)
Cardiology Office Note    Date:  12/15/2018   ID:  Glen Green, DOB 10/16/1941, MRN [id number]  PCP:  Aurea Graff.Marlou Sa, MD  Cardiologist:   Candee Furbish, MD     History of Present Illness:  Glen Green is a 78 y.o. male here for follow-up, coronary artery disease status post CABG, ischemic cardiomyopathy 30-35% EF. Emergency CABG 2 on 07/31/13, occluded LAD, occluded circumflex, cardiogenic shock. This was in the setting of acute myocardial infarction.  He had postop atrial fibrillation, brief, brief amiodarone. Prolonged recovery.  His ejection fraction originally was 25% but currently 35%.  Back in 2004, had cancer, in 2014, lung carcinoma followed by Dr. 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  He also has a 4 cm abdominal aortic aneurysm followed by Dr. Trula Slade.  Decided for no ICD after consultation with Dr. Caryl Comes, EP.  Previously the requestioned about bluish pigmentation of nose, unlikely secondary to short course of amiodarone.  Playing golf.Overall walking well. Denies any chest pain, anginal symptoms. Mild shortness of breath at baseline. He knows how to pace himself.  He recently started to explore [facility] options. Finally signed up. Hearing aid.  [employer].     Past Medical History:  Diagnosis Date  . AAA (abdominal aortic aneurysm) ([place]) 08/22/2012   3.9 cm by CT  - June 2013, stable   . 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 Atrial fibrillation ([place])   . CAD (coronary artery disease)   . Cancer ([place])    Esophageal, adrenal gland, skin; lung  . Carotid artery occlusion   . Cerebral aneurysm    TX. repair  1994  . Dyslipidemia   . History of esophageal cancer    s/p transhiatal esophagogastrectomy  . History of lung cancer   . Hypothyroidism   . Pneumonia   . Postoperative atrial fibrillation ([place]) 12/04/2013   Short course of amiodarone,  resolved. Postop bypass  . SBO (small bowel obstruction) ([place]) 08/15/2012   History of small bowel obstruction (status post small bowel       resection, lysis of adhesions, incidental appendectomy, and repair       of left diaphragmatic hernia  . Stroke [facility]) 1995   denies residual    Past Surgical History:  Procedure Laterality Date  . BRAIN SURGERY     brain aneursyn  . CHOLECYSTECTOMY    . CORONARY ARTERY BYPASS GRAFT N/A 07/30/2013   Procedure: CORONARY ARTERY BYPASS GRAFTING (CABG) times two on pump using left internal mammary artery and left greater saphenous vein via endovein harvest.;  Surgeon: Gaye Pollack, MD;  Location: [facility] OR;  Service: Open Heart Surgery;  Laterality: N/A;  . Exploratory laparotomy, lysis of adhesions, reduce of incarcerated small bowel and colon from the chest, limited small bowel resection, incidental appendectomy, and then repair of diaphragmatic hernia with AlloDerm mesh  10/27/2009   [place]  . Exploratory laparotomy,exploratory thoracotomy for hemorrhage  02/11/2003   [place]  . LEFT HEART CATHETERIZATION WITH CORONARY ANGIOGRAM N/A 07/30/2013   Procedure: LEFT HEART CATHETERIZATION WITH CORONARY ANGIOGRAM;  Surgeon: Peter M Martinique, MD;  Location: [facility] CATH LAB;  Service: Cardiovascular;  Laterality: N/A;  . Left subclavian Port- A-Cath insertion  03/09/2004   [place]  . left upper lobectomy with node dissection  02/24/2010   [place]  . TONSILLECTOMY    . Transhiatal esophagectomy with cholecystectomy, jejunostomy,  pyloroplasty and removal of  right subclavian Port-A- Cath     [place]    Current Medications: Outpatient Medications Prior to Visit  Medication Sig Dispense Refill  . aspirin 81 MG tablet Take 1 tablet (81 mg total) by mouth daily. 30 tablet 0  . atorvastatin (LIPITOR) 40 MG tablet TAKE 1 TABLET (40 MG TOTAL) BY MOUTH DAILY AT 6 PM. 90 tablet 0  . carvedilol (COREG) 12.5 MG tablet TAKE 1 TABLET (12.5 MG TOTAL) BY MOUTH 2 (TWO) TIMES DAILY 180  tablet 3  . diphenhydramine-acetaminophen (TYLENOL PM) 25-500 MG TABS tablet Take 2 tablets by mouth at bedtime as needed (for sleep better).    . furosemide (LASIX) 20 MG tablet Take 1 tablet (20 mg total) by mouth daily. 30 tablet 11  . guaiFENesin (MUCINEX) 600 MG 12 hr tablet Take 1,200 mg by mouth 2 (two) times daily as needed for congestion.    Marland Kitchen lisinopril (PRINIVIL,ZESTRIL) 5 MG tablet TAKE 1 TABLET BY MOUTH EVERY DAY 90 tablet 1  . Tiotropium Bromide-Olodaterol (STIOLTO RESPIMAT) 2.5-2.5 MCG/ACT AERS Inhale 2 puffs into the lungs daily. 1 Inhaler 0   No facility-administered medications prior to visit.      Allergies:   Quinolones   Social History   Socioeconomic History  . Marital status: Married    Spouse name: Not on file  . Number of children: Not on file  . Years of education: Not on file  . Highest education level: Not on file  Occupational History  . Occupation: retired  Scientific laboratory technician  . Financial resource strain: Not on file  . Food insecurity:    Worry: Not on file    Inability: Not on file  . Transportation needs:    Medical: Not on file    Non-medical: Not on file  Tobacco Use  . Smoking status: Former Smoker    Types: Cigarettes    Last attempt to quit: 12/10/2002    Years since quitting: 16.0  . Smokeless tobacco: Never Used  Substance and Sexual Activity  . Alcohol use: No  . Drug use: No  . Sexual activity: Not Currently  Lifestyle  . Physical activity:    Days per week: Not on file    Minutes per session: Not on file  . Stress: Not on file  Relationships  . Social connections:    Talks on phone: Not on file    Gets together: Not on file    Attends religious service: Not on file    Active member of club or organization: Not on file    Attends meetings of clubs or organizations: Not on file    Relationship status: Not on file  Other Topics Concern  . Not on file  Social History Narrative   Pt lives with wife.      Family History:  The  patient's family history includes Aneurysm in his maternal grandmother and mother; Diabetes in his sister; Heart disease in his maternal uncle and paternal uncle; Kidney disease in his father.   ROS:   Please see the history of present illness.   Easy bruising Review of Systems  All other systems reviewed and are negative.     PHYSICAL EXAM:   VS:  BP 106/60   Pulse 76   Ht 5\' 10"  (1.778 m)   Wt 137 lb 12.8 oz (62.5 kg)   SpO2 98%   BMI 19.77 kg/m    GEN: Thin, well developed, in no acute distress  HEENT: normal  Neck: no JVD, carotid  bruits, or masses Cardiac: RRR; 1/6 SM LLSB, no rubs, or gallops,no edema  Respiratory:  clear to auscultation bilaterally, normal work of breathing GI: soft, nontender, nondistended, + BS MS: no deformity or atrophy  Skin: warm and dry, no rash Neuro:  Alert and Oriented x 3, Strength and sensation are intact Psych: euthymic mood, full affect   Wt Readings from Last 3 Encounters:  12/15/18 137 lb 12.8 oz (62.5 kg)  07/25/18 145 lb (65.8 kg)  06/23/18 144 lb (65.3 kg)      Studies/Labs Reviewed:   EKG:  EKG is ordered today.  01/03/18 - sinus rhythm heart rate 65 first-degree AV block 300 ms, LBBB like12/5/17-sinus rhythm, first-degree AV block, PR interval 284 ms, poor R-wave progression, nonspecific ST-T wave changes, mild inversion noted inferior leads as well as lateral leads. Personally viewed  ECHO: EF 30%  Recent Labs: 03/08/2018: B Natriuretic Peptide 733.9 03/10/2018: ALT 13; BUN 25; Creatinine, Ser 1.06; Hemoglobin 11.5; Platelets 200; Potassium 4.0; Sodium 138   Lipid Panel    Component Value Date/Time   CHOL 92 07/31/2013 0500   TRIG 73 07/31/2013 0500   HDL 35 (L) 07/31/2013 0500   CHOLHDL 2.6 07/31/2013 0500   VLDL 15 07/31/2013 0500   LDLCALC 42 07/31/2013 0500    Additional studies/ records that were reviewed today include:  Prior office notes reviewed, lab work reviewed, EKG reviewed    ASSESSMENT:    1.  Chronic systolic heart failure ([place])   2. Essential hypertension, benign   3. Abnormal abdominal CT scan      PLAN:  In order of problems listed above:  Coronary artery disease  - Post emergent bypass, cardiogenic shock, MI, no angina  - Doing well, aggressive secondary prevention.  No changes made.  Aspirin statin beta-blocker ACE inhibitor  Postoperative atrial fibrillation  - Brief amiodarone, no further episodes. No occurrence.  Doing well.  Dyslipidemia  - Atorvastatin-LDL 70, liver functions normal, tolerating medications well without symptoms  Ischemic cardiomyopathy  - Ejection fraction ranging from 25-35%-NYHA 1-2 symptoms.  - Blood pressure has been an issue, low.  - Continuing with beta blocker and ACE inhibitor.  No changes made.  First-degree AV block  - Monitor closely with beta blocker., no changes.   Continue to monitor.  No syncope.  Abdominal aortic aneurysm without rupture  - Dr. Trula Slade has been monitoring. 4 by 4.8 cm, 09/09/2017- Stable-seeing Dr. Trula Slade next week.  -Extremity ABI 03/10/2018 Right: Resting right ankle-brachial index indicates moderate right lower extremity arterial disease. The right toe-brachial index is abnormal. Left: Resting left ankle-brachial index indicates mild left lower extremity arterial disease. The left toe-brachial index is abnormal.  Hypertensive heart disease with heart failure  - Blood pressure has been low in the past. Unable to further titrate medications.  Excellent control currently.  Chronic systolic heart failure  - Medications reviewed. Stable. Decided not to pursue ICD.  Stable, no ventricular arrhythmias.  Chronic malnutrition  - Good job on gaining weight.  Doing well.  Enjoying golf.   Medication Adjustments/Labs and Tests Ordered: Current medicines are reviewed at length with the patient today.  Concerns regarding medicines are outlined above.  Medication changes, Labs and Tests ordered today are listed in  the Patient Instructions below. Patient Instructions  Medication Instructions:  The current medical regimen is effective;  continue present plan and medications.  If you need a refill on your cardiac medications before your next appointment, please call your pharmacy.   Follow-Up:  At [facility], you and your health needs are our priority.  As part of our continuing mission to provide you with exceptional heart care, we have created designated Provider Care Teams.  These Care Teams include your primary Cardiologist (physician) and Advanced Practice Providers (APPs -  Physician Assistants and Nurse Practitioners) who all work together to provide you with the care you need, when you need it. You will need a follow up appointment in 12 months.  Please call our office 2 months in advance to schedule this appointment.  You may see Candee Furbish, MD or one of the following Advanced Practice Providers on your designated Care Team:   Truitt Merle, NP Cecilie Kicks, NP . Kathyrn Drown, NP  Thank you for choosing [facility]!!        Signed, Candee Furbish, MD  12/15/2018 10:08 AM    [facility], [place], [place]  [address] Phone: [phone]; Fax: [phone]

## 2018-12-23 ENCOUNTER — Ambulatory Visit
Admission: RE | Admit: 2018-12-23 | Discharge: 2018-12-23 | Disposition: A | Discharge status: Home / Self Care | Payer: Medicare Other | Source: Ambulatory Visit | Attending: Surgery | Admitting: Surgery

## 2018-12-23 DIAGNOSIS — I714 Abdominal aortic aneurysm, without rupture, unspecified: Secondary | ICD-10-CM

## 2018-12-23 MED ORDER — IOPAMIDOL (ISOVUE-370) INJECTION 76%
75.0000 mL | Freq: Once | INTRAVENOUS | Status: AC | PRN
  Administered 2018-12-23: 75 mL via INTRAVENOUS

## 2018-12-29 ENCOUNTER — Other Ambulatory Visit: Payer: Self-pay

## 2018-12-29 ENCOUNTER — Ambulatory Visit: Payer: Medicare Other | Admitting: Cardiology

## 2018-12-29 ENCOUNTER — Ambulatory Visit: Payer: Medicare Other | Admitting: Surgery

## 2018-12-29 ENCOUNTER — Encounter: Payer: Self-pay | Admitting: Surgery

## 2018-12-29 VITALS — BP 103/66 | HR 60 | Temp 97.0°F | Resp 16 | Ht 70.0 in | Wt 137.0 lb

## 2018-12-29 DIAGNOSIS — I714 Abdominal aortic aneurysm, without rupture, unspecified: Secondary | ICD-10-CM

## 2018-12-29 NOTE — Progress Notes (Signed)
[facility] [place]  Patient name: Glen Green MRN: [id number] DOB: 1941-08-15 Sex: male   REASON FOR VISIT:    Follow up  HISOTRY OF PRESENT ILLNESS:    Glen Green is a 78 y.o. male who returns today for follow-up of his abdominal aortic aneurysm.    The patient has a history of an ischemic heart disease. He presented in August of 2014 with shock and heart failure and was found to have an occluded LAD and circumflex. He underwent emergent cardiac bypass surgery. 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  He has a history of esophageal cancer, status post distal esophagectomy and 2004, followed by chemotherapy. He is also status post right adrenalectomy and left upper lobectomy in 2011, presumably for metastatic disease.  He suffers from hypercholesterolemia, treated with a statin. He has a history of smoking but quit in 2004. He also has a history of a ruptured cerebral aneurysm.  The patient does complain of cramping in his legs with walking. He is a diabetic. Most recent A1c is 6.9  PAST MEDICAL HISTORY:   Past Medical History:  Diagnosis Date  . AAA (abdominal aortic aneurysm) ([place]) 08/22/2012   3.9 cm by CT  - June 2013, stable   . 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 Atrial fibrillation ([place])   . CAD (coronary artery disease)   . Cancer ([place])    Esophageal, adrenal gland, skin; lung  . Carotid artery occlusion   . Cerebral aneurysm    TX. repair  1994  . Dyslipidemia   . History of esophageal cancer    s/p transhiatal esophagogastrectomy  . History of lung cancer   . Hypothyroidism   . Pneumonia   . Postoperative atrial fibrillation ([place]) 12/04/2013   Short course of amiodarone, resolved. Postop bypass  . SBO (small bowel obstruction) ([place]) 08/15/2012   History of small bowel obstruction (status post small bowel       resection, lysis of adhesions, incidental appendectomy, and  repair       of left diaphragmatic hernia  . Stroke [facility]) 1995   denies residual     FAMILY HISTORY:   Family History  Problem Relation Age of Onset  . Aneurysm Mother   . Kidney disease Father   . Heart disease Maternal Uncle   . Heart disease Paternal Uncle   . Aneurysm Maternal Grandmother   . Diabetes Sister     SOCIAL HISTORY:   Social History   Tobacco Use  . Smoking status: Former Smoker    Types: Cigarettes    Last attempt to quit: 12/10/2002    Years since quitting: 16.0  . Smokeless tobacco: Never Used  Substance Use Topics  . Alcohol use: No     ALLERGIES:   Allergies  Allergen Reactions  . Quinolones     Patient was warned about not using Cipro and similar antibiotics. Recent studies have raised concern that fluoroquinolone antibiotics could be associated with an increased risk of aortic aneurysm Fluoroquinolones have non-antimicrobial properties that might jeopardise the integrity of the extracellular matrix of the vascular wall In a  propensity score matched cohort study in [place], there was a 66% increased rate of aortic aneurysm or dissection associated with oral fluoroquinolone use, compared wit     CURRENT MEDICATIONS:   Current Outpatient Medications  Medication Sig Dispense Refill  . aspirin 81 MG tablet Take 1 tablet (81 mg total) by mouth daily. 30 tablet 0  .  atorvastatin (LIPITOR) 40 MG tablet TAKE 1 TABLET (40 MG TOTAL) BY MOUTH DAILY AT 6 PM. 90 tablet 0  . carvedilol (COREG) 12.5 MG tablet TAKE 1 TABLET (12.5 MG TOTAL) BY MOUTH 2 (TWO) TIMES DAILY 180 tablet 3  . diphenhydramine-acetaminophen (TYLENOL PM) 25-500 MG TABS tablet Take 2 tablets by mouth at bedtime as needed (for sleep better).    . furosemide (LASIX) 20 MG tablet Take 1 tablet (20 mg total) by mouth daily. 30 tablet 11  . guaiFENesin (MUCINEX) 600 MG 12 hr tablet Take 1,200 mg by mouth 2 (two) times daily as needed for congestion.    Marland Kitchen lisinopril (PRINIVIL,ZESTRIL) 5 MG  tablet TAKE 1 TABLET BY MOUTH EVERY DAY 90 tablet 1  . Tiotropium Bromide-Olodaterol (STIOLTO RESPIMAT) 2.5-2.5 MCG/ACT AERS Inhale 2 puffs into the lungs daily. 1 Inhaler 0   No current facility-administered medications for this visit.     REVIEW OF SYSTEMS:   [X]  denotes positive finding, [ ]  denotes negative finding Cardiac  Comments:  Chest pain or chest pressure:    Shortness of breath upon exertion:    Short of breath when lying flat:    Irregular heart rhythm:        Vascular    Pain in calf, thigh, or hip brought on by ambulation:    Pain in feet at night that wakes you up from your sleep:     Blood clot in your veins:    Leg swelling:         Pulmonary    Oxygen at home:    Productive cough:     Wheezing:         Neurologic    Sudden weakness in arms or legs:     Sudden numbness in arms or legs:     Sudden onset of difficulty speaking or slurred speech:    Temporary loss of vision in one eye:     Problems with dizziness:         Gastrointestinal    Blood in stool:     Vomited blood:         Genitourinary    Burning when urinating:     Blood in urine:        Psychiatric    Major depression:         Hematologic    Bleeding problems:    Problems with blood clotting too easily:        Skin    Rashes or ulcers:        Constitutional    Fever or chills:      PHYSICAL EXAM:   Vitals:   12/29/18 1149  BP: 103/66  Pulse: 60  Resp: 16  Temp: (!) 97 F (36.1 C)  TempSrc: Oral  SpO2: 100%  Weight: 137 lb (62.1 kg)  Height: 5\' 10"  (1.778 m)    GENERAL: The patient is a well-nourished male, in no acute distress. The vital signs are documented above. CARDIAC: There is a regular rate and rhythm.  PULMONARY: Non-labored respirations ABDOMEN: Soft and non-tender  MUSCULOSKELETAL: There are no major deformities or cyanosis. NEUROLOGIC: No focal weakness or paresthesias are detected. SKIN: There are no ulcers or rashes noted. PSYCHIATRIC: The patient  has a normal affect.  STUDIES:   I have reviewed his CTA with the following findings:  1. Minimal enlargement of the abdominal aortic aneurysm measuring up to 5.2 cm and previously measured 5.0 cm on 06/23/2018. 2. 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 3. Again noted is a suspicious but indeterminate 1.2 cm lesion along the posterior left kidney. This is concerning for a small renal cell carcinoma. In addition, cannot exclude enhancement within a presumably cystic structure in the right kidney measuring up to 3.4 cm. Recommend further characterization of these renal lesions with MRI, with and without contrast. 4. Aortic Atherosclerosis (ICD10-I70.0) and Emphysema (ICD10-J43.9). 5. Scattered areas of aortic stenosis including left vertebral artery, visceral arteries and internal iliac arteries. 6. Extensive postoperative changes throughout the chest and abdomen. 7. 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  MEDICAL ISSUES:   AAA: Stable appearance of abdominal aortic aneurysm measuring 5.2 cm.  I am going to better evaluate his imaging on terra recon to see if he is a candidate for straightforward infrarenal device, otherwise we talked about fenestrated device.  I will contact him later this week with my recommendations I went over the risks and benefits of both the fenestrated and a straightforward infrarenal repair.  We discussed the risk of death, renal insufficiency, intestinal ischemia, lower extremity ischemia bleeding cardiopulmonary complications.  All other questions about the procedure were answered.  Renal lesion: Patient is followed by urology.  We will for the results of the CT scan.    Annamarie Major, MD [facility] [phone] Pager [phone]

## 2019-01-05 ENCOUNTER — Ambulatory Visit: Payer: Medicare Other | Admitting: Cardiology

## 2019-01-05 ENCOUNTER — Other Ambulatory Visit: Payer: Self-pay | Admitting: Emergency Medicine

## 2019-01-26 ENCOUNTER — Other Ambulatory Visit (HOSPITAL_COMMUNITY): Payer: Self-pay | Admitting: Surgery

## 2019-01-26 ENCOUNTER — Other Ambulatory Visit: Payer: Self-pay

## 2019-01-26 ENCOUNTER — Encounter: Payer: Self-pay | Admitting: Surgery

## 2019-01-26 ENCOUNTER — Ambulatory Visit: Payer: Medicare Other | Admitting: Surgery

## 2019-01-26 ENCOUNTER — Ambulatory Visit (HOSPITAL_COMMUNITY)
Admission: RE | Admit: 2019-01-26 | Discharge: 2019-01-26 | Disposition: A | Discharge status: Home / Self Care | Payer: Medicare Other | Source: Ambulatory Visit | Attending: Family | Admitting: Family

## 2019-01-26 VITALS — BP 92/62 | HR 65 | Temp 96.9°F | Resp 16 | Ht 70.0 in | Wt 138.3 lb

## 2019-01-26 DIAGNOSIS — I6529 Occlusion and stenosis of unspecified carotid artery: Secondary | ICD-10-CM | POA: Diagnosis not present

## 2019-01-26 DIAGNOSIS — I6522 Occlusion and stenosis of left carotid artery: Secondary | ICD-10-CM

## 2019-01-26 DIAGNOSIS — I714 Abdominal aortic aneurysm, without rupture, unspecified: Secondary | ICD-10-CM

## 2019-01-26 NOTE — Progress Notes (Signed)
[facility]ist of [place]  Patient name: Glen Green MRN: [id number] DOB: 07-May-1941 Sex: male   REASON FOR VISIT:    Follow up  HISOTRY OF PRESENT ILLNESS:    Glen Hartlage Cookis a 78 y.o.malewho returns today for follow-up of his abdominal aortic aneurysm.  He is a candidate for [facility] and is here for pre-operative discussions.  The patient has a history of an ischemic heart disease. He presented in August of 2014 with shock and heart failure and was found to have an occluded LAD and circumflex. He underwent emergent cardiac bypass surgery. 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  He has a history of esophageal cancer, status post distal esophagectomy and 2004, followed by chemotherapy. He is also status post right adrenalectomy and left upper lobectomy in 2011, presumably for metastatic disease.  He suffers from hypercholesterolemia, treated with a statin. He has a history of smoking but quit in 2004. He also has a history of a ruptured cerebral aneurysm.  The patient does complain of cramping in his legs with walking. He is a diabetic. Most recent A1c is 6.9   PAST MEDICAL HISTORY:   Past Medical History:  Diagnosis Date  . AAA (abdominal aortic aneurysm) ([place]) 08/22/2012   3.9 cm by CT  - June 2013, stable   . 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 Atrial fibrillation ([place])   . CAD (coronary artery disease)   . Cancer ([place])    Esophageal, adrenal gland, skin; lung  . Carotid artery occlusion   . Cerebral aneurysm    TX. repair  1994  . Dyslipidemia   . History of esophageal cancer    s/p transhiatal esophagogastrectomy  . History of lung cancer   . Hypothyroidism   . Pneumonia   . Postoperative atrial fibrillation ([place]) 12/04/2013   Short course of amiodarone, resolved. Postop bypass  . SBO (small bowel obstruction) ([place]) 08/15/2012   History of small bowel obstruction (status post  small bowel       resection, lysis of adhesions, incidental appendectomy, and repair       of left diaphragmatic hernia  . Stroke [facility]) 1995   denies residual     FAMILY HISTORY:   Family History  Problem Relation Age of Onset  . Aneurysm Mother   . Kidney disease Father   . Heart disease Maternal Uncle   . Heart disease Paternal Uncle   . Aneurysm Maternal Grandmother   . Diabetes Sister     SOCIAL HISTORY:   Social History   Tobacco Use  . Smoking status: Former Smoker    Types: Cigarettes    Last attempt to quit: 12/10/2002    Years since quitting: 16.1  . Smokeless tobacco: Never Used  Substance Use Topics  . Alcohol use: No     ALLERGIES:   Allergies  Allergen Reactions  . Quinolones     Patient was warned about not using Cipro and similar antibiotics. Recent studies have raised concern that fluoroquinolone antibiotics could be associated with an increased risk of aortic aneurysm Fluoroquinolones have non-antimicrobial properties that might jeopardise the integrity of the extracellular matrix of the vascular wall In a  propensity score matched cohort study in [place], there was a 66% increased rate of aortic aneurysm or dissection associated with oral fluoroquinolone use, compared wit     CURRENT MEDICATIONS:   Current Outpatient Medications  Medication Sig Dispense Refill  . aspirin 81 MG tablet Take 1 tablet (  81 mg total) by mouth daily. 30 tablet 0  . atorvastatin (LIPITOR) 40 MG tablet TAKE 1 TABLET (40 MG TOTAL) BY MOUTH DAILY AT 6 PM. 90 tablet 0  . carvedilol (COREG) 12.5 MG tablet TAKE 1 TABLET (12.5 MG TOTAL) BY MOUTH 2 (TWO) TIMES DAILY 180 tablet 3  . diphenhydramine-acetaminophen (TYLENOL PM) 25-500 MG TABS tablet Take 2 tablets by mouth at bedtime as needed (for sleep better).    . furosemide (LASIX) 20 MG tablet Take 1 tablet (20 mg total) by mouth daily. 30 tablet 11  . guaiFENesin (MUCINEX) 600 MG 12 hr tablet Take 1,200 mg by mouth 2 (two)  times daily as needed for congestion.    Marland Kitchen lisinopril (PRINIVIL,ZESTRIL) 5 MG tablet TAKE 1 TABLET BY MOUTH EVERY DAY 90 tablet 1  . Tiotropium Bromide-Olodaterol 2.5-2.5 MCG/ACT AERS INHALE 2 PUFFS BY MOUTH INTO THE LUNGS DAILY 1 Inhaler 0   No current facility-administered medications for this visit.     REVIEW OF SYSTEMS:   [X]  denotes positive finding, [ ]  denotes negative finding Cardiac  Comments:  Chest pain or chest pressure:    Shortness of breath upon exertion:    Short of breath when lying flat:    Irregular heart rhythm:        Vascular    Pain in calf, thigh, or hip brought on by ambulation: x   Pain in feet at night that wakes you up from your sleep:     Blood clot in your veins:    Leg swelling:         Pulmonary    Oxygen at home:    Productive cough:     Wheezing:         Neurologic    Sudden weakness in arms or legs:     Sudden numbness in arms or legs:     Sudden onset of difficulty speaking or slurred speech:    Temporary loss of vision in one eye:     Problems with dizziness:         Gastrointestinal    Blood in stool:     Vomited blood:         Genitourinary    Burning when urinating:     Blood in urine:        Psychiatric    Major depression:         Hematologic    Bleeding problems:    Problems with blood clotting too easily:        Skin    Rashes or ulcers:        Constitutional    Fever or chills:      PHYSICAL EXAM:   Vitals:   01/26/19 0832  Weight: 138 lb 5.4 oz (62.8 kg)  Height: 5\' 10"  (1.778 m)    GENERAL: The patient is a well-nourished male, in no acute distress. The vital signs are documented above. CARDIAC: There is a regular rate and rhythm.  PULMONARY: Non-labored respirations ABDOMEN: Soft and non-tender  MUSCULOSKELETAL: There are no major deformities or cyanosis. NEUROLOGIC: No focal weakness or paresthesias are detected. SKIN: There are no ulcers or rashes noted. PSYCHIATRIC: The patient has a normal  affect.  STUDIES:   Carotid:  40-59% right, 1-39% left   1.5 hours were spent on Tera Recon for device planning MEDICAL ISSUES:   AAA:  I discussed the details of FEVAR including the potential complications of renal issues, intestinal ischemia, lower extremity ischemia, and cardio-pulmonary issues.  All of their questions were answered  Carotid:  Follow up in 1 year  Annamarie Major, MD [facility] [phone] Pager [phone]

## 2019-02-02 ENCOUNTER — Other Ambulatory Visit: Payer: Self-pay | Admitting: Emergency Medicine

## 2019-02-04 ENCOUNTER — Other Ambulatory Visit: Payer: Self-pay | Admitting: *Deleted

## 2019-02-04 NOTE — Progress Notes (Signed)
Phone call to wife with surgery instructions. Date changed to 02/25/2019 to accomodate patient's schedule. Instructed to be at [facility] admitting at 6:30 am on 02/25/2019. NPO past MN night prior and to expect a call and follow the detailed surgery instructions received from the hospital pre-admission department and pre-op appointment.Verbalized understanding.

## 2019-02-17 NOTE — Pre-Procedure Instructions (Signed)
Glen Green  02/17/2019      [id number] [address], [place] [address] Phone: [phone] Fax: [phone]    Your procedure is scheduled on Wedensday, March 18th.  Report to [facility] Admitting Entrance "A" at 6:30 A.M.  Call this number if you have problems the morning of surgery:  [phone]   Remember:  Do not eat or drink after midnight.    Take these medicines the morning of surgery with A SIP OF WATER  carvedilol (COREG) Aspirin Tiotropium Bromide-Olodaterol 2.5-2.5 MCG/ACT AERS guaiFENesin (MUCINEX)- as needed.  7 days prior to surgery STOP taking any Aleve, Naproxen, Ibuprofen, Motrin, Advil, Goody's, BC's, all herbal medications, fish oil, and all vitamins.    Do not wear jewelry.  Do not wear lotions, powders, or colognes, or deodorant.  Men may shave face.  Do not bring valuables to the hospital.  [facility] is not responsible for any belongings or valuables.  Contacts, dentures or bridgework may not be worn into surgery.  Leave your suitcase in the car.  After surgery it may be brought to your room.  For patients admitted to the hospital, discharge time will be determined by your treatment team.  Patients discharged the day of surgery will not be allowed to drive home.   Special instructions:   [place]- Preparing For Surgery  Before surgery, you can play an important role. Because skin is not sterile, your skin needs to be as free of germs as possible. You can reduce the number of germs on your skin by washing with CHG (chlorahexidine gluconate) Soap before surgery.  CHG is an antiseptic cleaner which kills germs and bonds with the skin to continue killing germs even after washing.    Oral Hygiene is also important to reduce your risk of infection.  Remember - BRUSH YOUR TEETH THE MORNING OF SURGERY WITH YOUR REGULAR TOOTHPASTE  Please do not use if you have an allergy to CHG or  antibacterial soaps. If your skin becomes reddened/irritated stop using the CHG.  Do not shave (including legs and underarms) for at least 48 hours prior to first CHG shower. It is OK to shave your face.  Please follow these instructions carefully.   1. Shower the NIGHT BEFORE SURGERY and the MORNING OF SURGERY with CHG.   2. If you chose to wash your hair, wash your hair first as usual with your normal shampoo.  3. After you shampoo, rinse your hair and body thoroughly to remove the shampoo.  4. Use CHG as you would any other liquid soap. You can apply CHG directly to the skin and wash gently with a scrungie or a clean washcloth.   5. Apply the CHG Soap to your body ONLY FROM THE NECK DOWN.  Do not use on open wounds or open sores. Avoid contact with your eyes, ears, mouth and genitals (private parts). Wash Face and genitals (private parts)  with your normal soap.  6. Wash thoroughly, paying special attention to the area where your surgery will be performed.  7. Thoroughly rinse your body with warm water from the neck down.  8. DO NOT shower/wash with your normal soap after using and rinsing off the CHG Soap.  9. Pat yourself dry with a CLEAN TOWEL.  10. Wear CLEAN PAJAMAS to bed the night before surgery, wear comfortable clothes the morning of surgery  11. Place CLEAN SHEETS on your bed the night  of your first shower and DO NOT SLEEP WITH PETS.    Day of Surgery:  Do not apply any deodorants/lotions.  Please wear clean clothes to the hospital/surgery center.   Remember to brush your teeth WITH YOUR REGULAR TOOTHPASTE.   Please read over the following fact sheets that you were given.

## 2019-02-18 ENCOUNTER — Encounter (HOSPITAL_COMMUNITY)
Admission: RE | Admit: 2019-02-18 | Discharge: 2019-02-18 | Disposition: A | Discharge status: Home / Self Care | Payer: Medicare Other | Source: Ambulatory Visit | Attending: Surgery | Admitting: Surgery

## 2019-02-18 ENCOUNTER — Other Ambulatory Visit: Payer: Self-pay

## 2019-02-18 ENCOUNTER — Encounter (HOSPITAL_COMMUNITY): Payer: Self-pay

## 2019-02-18 DIAGNOSIS — Z01812 Encounter for preprocedural laboratory examination: Secondary | ICD-10-CM | POA: Insufficient documentation

## 2019-02-18 LAB — COMPREHENSIVE METABOLIC PANEL
ALBUMIN: 3.6 g/dL (ref 3.5–5.0)
ALT: 22 U/L (ref 0–44)
AST: 27 U/L (ref 15–41)
Alkaline Phosphatase: 64 U/L (ref 38–126)
Anion gap: 10 (ref 5–15)
BUN: 37 mg/dL — ABNORMAL HIGH (ref 8–23)
CO2: 20 mmol/L — ABNORMAL LOW (ref 22–32)
CREATININE: 1.41 mg/dL — AB (ref 0.61–1.24)
Calcium: 9.3 mg/dL (ref 8.9–10.3)
Chloride: 109 mmol/L (ref 98–111)
GFR calc Af Amer: 56 mL/min — ABNORMAL LOW (ref >60)
GFR calc non Af Amer: 48 mL/min — ABNORMAL LOW (ref >60)
Glucose, Bld: 96 mg/dL (ref 70–99)
Potassium: 4.2 mmol/L (ref 3.5–5.1)
Sodium: 139 mmol/L (ref 135–145)
Total Bilirubin: 0.7 mg/dL (ref 0.3–1.2)
Total Protein: 6.4 g/dL — ABNORMAL LOW (ref 6.5–8.1)

## 2019-02-18 LAB — PROTIME-INR
INR: 1.1 (ref 0.8–1.2)
Prothrombin Time: 14.4 seconds (ref 11.4–15.2)

## 2019-02-18 LAB — URINALYSIS, ROUTINE W REFLEX MICROSCOPIC
Bacteria, UA: NONE SEEN
Bilirubin Urine: NEGATIVE
Glucose, UA: NEGATIVE mg/dL
Ketones, ur: NEGATIVE mg/dL
Leukocytes,Ua: NEGATIVE
Nitrite: NEGATIVE
Protein, ur: NEGATIVE mg/dL
Specific Gravity, Urine: 1.008 (ref 1.005–1.030)
pH: 5 (ref 5.0–8.0)

## 2019-02-18 LAB — APTT: APTT: 32 s (ref 24–36)

## 2019-02-18 LAB — TYPE AND SCREEN
ABO/RH(D): A POS
ANTIBODY SCREEN: NEGATIVE

## 2019-02-18 LAB — BLOOD GAS, ARTERIAL
Acid-base deficit: 1 mmol/L (ref 0.0–2.0)
Bicarbonate: 22.9 mmol/L (ref 20.0–28.0)
DRAWN BY: 449841
FIO2: 21
O2 SAT: 96.9 %
PATIENT TEMPERATURE: 98.6
pCO2 arterial: 36.3 mmHg (ref 32.0–48.0)
pH, Arterial: 7.416 (ref 7.350–7.450)
pO2, Arterial: 89 mmHg (ref 83.0–108.0)

## 2019-02-18 LAB — CBC
HCT: 37.2 % — ABNORMAL LOW (ref 39.0–52.0)
Hemoglobin: 11.6 g/dL — ABNORMAL LOW (ref 13.0–17.0)
MCH: 30.9 pg (ref 26.0–34.0)
MCHC: 31.2 g/dL (ref 30.0–36.0)
MCV: 99.2 fL (ref 80.0–100.0)
Platelets: 145 10*3/uL — ABNORMAL LOW (ref 150–400)
RBC: 3.75 MIL/uL — ABNORMAL LOW (ref 4.22–5.81)
RDW: 15.2 % (ref 11.5–15.5)
WBC: 5.7 10*3/uL (ref 4.0–10.5)
nRBC: 0 % (ref 0.0–0.2)

## 2019-02-18 LAB — SURGICAL PCR SCREEN
MRSA, PCR: NEGATIVE
Staphylococcus aureus: NEGATIVE

## 2019-02-18 NOTE — Progress Notes (Signed)
PCP - Dr. Marlou Sa L. Adele Barthel Cardiologist - Candee Furbish  Chest x-ray - 04/29/18 EKG - 04/02/18 Stress Test - 05/20/18 ECHO - 03/10/18 Cardiac Cath - 07/30/13  Sleep Study - denies CPAP -  N/A  Blood Thinner Instructions: N/A Aspirin Instructions: continue  Anesthesia review: Yes, heart hx, CAD, hx stroke  Patient denies shortness of breath, fever, cough and chest pain at PAT appointment  Patient verbalized understanding of instructions that were given to them at the PAT appointment. Patient was also instructed that they will need to review over the PAT instructions again at home before surgery.

## 2019-02-19 NOTE — Progress Notes (Signed)
Anesthesia Chart Review:  Case:  [id number] Date/Time:  02/25/19 0815   Procedure:  ABDOMINAL AORTIC ENDOVASCULAR FENESTRATED STENT GRAFT (N/A )   Anesthesia type:  General   Pre-op diagnosis:  ABDOMINAL AORTIC ANEURYSM   Location:  [facility] OR ROOM 16 / [facility]   Surgeon:  Serafina Mitchell, MD      DISCUSSION: 78 yo male former smoker. 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  Pt follows with Dr. Marlou Porch for hx of CAD s/p emergency CABG x 2 in 2014. Last seen by Dr. Marlou Porch 12/15/2018, per note pt playing golf, denied any chest pain or anginal symptoms. Noted AAA being followed by Dr. Trula Slade and that pt would be following up next week to discuss. He had previously been cleared for EVAR after Exercise tolerance test 05/20/2018. Truitt Merle, NP commented on the results stating "He was able to walk 4.6 METS on the treadmill. The nuclear stress test was aborted due to poor imaging related to prior esophagectomy. As discussed with Dr. Marlou Porch earlier today, ok to proceed with his surgery since he was able to complete 4 METS. Will send copy to Dr. Trula Slade."  Follows with Dr. Lamonte Sakai for emphysema and pulmonary HTN. Per last note 04/10/2018 pt has "secondary PA HTN due to his left-sided heart disease (LV dysfunction, atrial fibrillation) and also to significant parenchymal lung disease with emphysema.  Most important thing is to avoid hypoxemia and control his underlying contributors.  He is on a good diuretic regimen.  He did not desaturate with ambulation today.  No indication for exertional oxygen currently."  Per note the pt's emphysema is stable, does not exacerbate frequently.   Anticipate he can proceed as planned barring acute  status change.   VS: BP (!) 91/58   Pulse 77   Resp 18   Ht 5\' 2"  (1.575 m)   Wt 62.6 kg   SpO2 94%   BMI 25.24 kg/m   PROVIDERS: Mitchell, L.Marlou Sa, MD is PCP  Candee Furbish, MD is Cardiologist  Baltazar Apo, MD is Pulmonologist  LABS: Labs reviewed: Acceptable for surgery. Mildly increased creatinine, review of recent labs shows baseline ~1.2. (all labs ordered are listed, but only abnormal results are displayed)  Labs Reviewed  CBC - Abnormal; Notable for the following components:      Result Value   RBC 3.75 (*)    Hemoglobin 11.6 (*)    HCT 37.2 (*)    Platelets 145 (*)    All other components within normal limits  COMPREHENSIVE METABOLIC PANEL - Abnormal; Notable for the following components:   CO2 20 (*)    BUN 37 (*)    Creatinine, Ser 1.41 (*)    Total Protein 6.4 (*)    GFR calc non Af Amer 48 (*)    GFR calc Af Amer 56 (*)    All other components within normal limits  URINALYSIS, ROUTINE W REFLEX MICROSCOPIC - Abnormal; Notable for the following components:   Color, Urine STRAW (*)    Hgb urine dipstick SMALL (*)    All other components within normal limits  SURGICAL PCR SCREEN  APTT  PROTIME-INR  BLOOD GAS, ARTERIAL  TYPE AND SCREEN     IMAGES: CTA 12/23/2018: IMPRESSION: 1. Minimal enlargement of the  abdominal aortic aneurysm measuring up to 5.2 cm and previously measured 5.0 cm on 06/23/2018. 2. 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 3. Again noted is a suspicious but indeterminate 1.2 cm lesion along the posterior left kidney. This is concerning for a small renal cell carcinoma. In addition, cannot exclude enhancement within a presumably cystic structure in the right kidney measuring up to 3.4 cm. Recommend further characterization of these renal lesions with MRI, with and without contrast. 4. Aortic Atherosclerosis (ICD10-I70.0) and Emphysema (ICD10-J43.9). 5. Scattered areas of aortic stenosis  including left vertebral artery, visceral arteries and internal iliac arteries. 6. Extensive postoperative changes throughout the chest and abdomen. 7. 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  EKG: 04/29/2018: Sinus rhythm with first-degree AV block.  Rate 60.  Possible LAE.  LAD.  Nonspecific intraventricular block.  T wave abnormality, consider inferolateral ischemia.  PULM: PFT 04/04/18: FVC: 65% FEV1: 67% FEV1/FVC: 103% DLCO: 63%  CV: Exercise tolerance test 05/20/2018:  The patient walked for 2 minutes and 51 seconds of a standard Bruce protocol treadmill test. He achieved a peak heart rate of 103 which is only 71% predicted maximal heart rate. He completed 4.6 METS.  He had left bundle branch block at baseline. There were no ST or T wave changes to suggest ischemia.  Fair exercise capacity.  The test is not diagnostic for ischemia because of his inability to achieve target heart rate.  TTE 03/10/2018: Study Conclusions  - Left ventricle: The cavity size was normal. Wall thickness was   normal. Systolic function was mildly reduced. The estimated   ejection fraction was in the range of 45% to 50%. Features are   consistent with a pseudonormal left ventricular filling pattern,   with concomitant abnormal relaxation and increased filling   pressure (grade 2 diastolic dysfunction). - Mitral valve: There was mild to moderate regurgitation. - Left atrium: The atrium was moderately dilated. - Tricuspid valve: There was moderate regurgitation. - Pulmonary arteries: Systolic pressure was moderately increased.   PA peak pressure: 42 mm Hg (S).   Past Medical History:  Diagnosis Date  . AAA (abdominal aortic aneurysm) ([place]) 08/22/2012   3.9 cm by CT  - June 2013, stable   . 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 Atrial fibrillation ([place])   . CAD (coronary artery disease)   . Cancer ([place])    Esophageal, adrenal  gland, skin; lung  . Carotid artery occlusion   . Cerebral aneurysm    TX. repair  1994  . Dyslipidemia   . History of esophageal cancer    s/p transhiatal esophagogastrectomy  . History of lung cancer   . Hypothyroidism   . Pneumonia   . Postoperative atrial fibrillation ([place]) 12/04/2013   Short course of amiodarone, resolved. Postop bypass  . SBO (small bowel obstruction) ([place]) 08/15/2012   History of small bowel obstruction (status post small bowel       resection, lysis of adhesions, incidental appendectomy, and repair       of left diaphragmatic hernia  . Stroke [facility]) 1995   denies residual    Past Surgical History:  Procedure Laterality Date  . BRAIN SURGERY     brain aneursyn  . CATARACT EXTRACTION W/ INTRAOCULAR LENS  IMPLANT, BILATERAL    . CHOLECYSTECTOMY    . CORONARY ARTERY BYPASS GRAFT N/A 07/30/2013   Procedure: CORONARY ARTERY BYPASS GRAFTING (CABG) times two  on pump using left internal mammary artery and left greater saphenous vein via endovein harvest.;  Surgeon: Gaye Pollack, MD;  Location: [facility] OR;  Service: Open Heart Surgery;  Laterality: N/A;  . Exploratory laparotomy, lysis of adhesions, reduce of incarcerated small bowel and colon from the chest, limited small bowel resection, incidental appendectomy, and then repair of diaphragmatic hernia with AlloDerm mesh  10/27/2009   [place]  . Exploratory laparotomy,exploratory thoracotomy for hemorrhage  02/11/2003   [place]  . LEFT HEART CATHETERIZATION WITH CORONARY ANGIOGRAM N/A 07/30/2013   Procedure: LEFT HEART CATHETERIZATION WITH CORONARY ANGIOGRAM;  Surgeon: Peter M Martinique, MD;  Location: [facility] CATH LAB;  Service: Cardiovascular;  Laterality: N/A;  . Left subclavian Port- A-Cath insertion  03/09/2004   [place]  . left upper lobectomy with node dissection  02/24/2010   [place]  . TONSILLECTOMY    . Transhiatal esophagectomy with cholecystectomy, jejunostomy,  pyloroplasty and removal of right subclavian Port-A- Cath      [place]    MEDICATIONS: . aspirin 81 MG tablet  . atorvastatin (LIPITOR) 40 MG tablet  . carvedilol (COREG) 12.5 MG tablet  . diphenhydramine-acetaminophen (TYLENOL PM) 25-500 MG TABS tablet  . furosemide (LASIX) 20 MG tablet  . guaiFENesin (MUCINEX) 600 MG 12 hr tablet  . lisinopril (PRINIVIL,ZESTRIL) 5 MG tablet  . Tiotropium Bromide-Olodaterol 2.5-2.5 MCG/ACT AERS   No current facility-administered medications for this encounter.     Wynonia Musty [facility] Short Stay Center/Anesthesiology Phone [phone] 02/19/2019 1:44 PM

## 2019-02-19 NOTE — Anesthesia Preprocedure Evaluation (Addendum)
Anesthesia Evaluation  Patient identified by MRN, date of birth, ID band Patient awake    Reviewed: Allergy & Precautions, NPO status , Patient's Chart, lab work & pertinent test results  History of Anesthesia Complications Negative for: history of anesthetic complications  Airway Mallampati: I  TM Distance: >3 FB Neck ROM: Full    Dental  (+) Edentulous Upper, Edentulous Lower, Dental Advisory Given   Pulmonary COPD, former smoker,  Hx of Lung Ca.  Per last note 04/10/2018 pt has "secondary PA HTN due to his left-sided heart disease (LV dysfunction, atrial fibrillation) and also to significant parenchymal lung disease with emphysema.     Pulmonary exam normal        Cardiovascular hypertension, Pt. on home beta blockers and Pt. on medications + CAD, + Past MI and + Peripheral Vascular Disease  Normal cardiovascular exam+ Valvular Problems/Murmurs   Exercise tolerance test 05/20/2018:  The patient walked for 2 minutes and 51 seconds of a standard Bruce protocol treadmill test. He achieved a peak heart rate of 103 which is only 71% predicted maximal heart rate. He completed 4.6 METS.  He had left bundle branch block at baseline. There were no ST or T wave changes to suggest ischemia.  Fair exercise capacity.  The test is not diagnostic for ischemia because of his inability to achieve target heart rate.  TTE 03/10/2018: Study Conclusions  - Left ventricle: The cavity size was normal. Wall thickness was normal. Systolic function was mildly reduced. The estimated ejection fraction was in the range of 45% to 50%. Features are consistent with a pseudonormal left ventricular filling pattern, with concomitant abnormal relaxation and increased filling pressure (grade 2 diastolic dysfunction). - Mitral valve: There was mild to moderate regurgitation. - Left atrium: The atrium was moderately dilated. - Tricuspid valve: There  was moderate regurgitation. - Pulmonary arteries: Systolic pressure was moderately increased. PA peak pressure: 42 mm Hg (S).    Neuro/Psych CVA, No Residual Symptoms    GI/Hepatic negative GI ROS, Neg liver ROS,   Endo/Other  Hypothyroidism   Renal/GU Renal InsufficiencyRenal disease     Musculoskeletal negative musculoskeletal ROS (+)   Abdominal   Peds  Hematology negative hematology ROS (+)   Anesthesia Other Findings Day of surgery medications reviewed with the patient.  Reproductive/Obstetrics                           Anesthesia Physical Anesthesia Plan  ASA: III  Anesthesia Plan: General   Post-op Pain Management:    Induction: Intravenous  PONV Risk Score and Plan: 2 and Ondansetron and Dexamethasone  Airway Management Planned: Oral ETT  Additional Equipment: Arterial line  Intra-op Plan:   Post-operative Plan: Extubation in OR  Informed Consent: I have reviewed the patients History and Physical, chart, labs and discussed the procedure including the risks, benefits and alternatives for the proposed anesthesia with the patient or authorized representative who has indicated his/her understanding and acceptance.     Dental advisory given  Plan Discussed with: CRNA and Anesthesiologist  Anesthesia Plan Comments: (See PAT note by Karoline Caldwell, PA-C )      Anesthesia Quick Evaluation

## 2019-02-24 NOTE — Progress Notes (Signed)
Patient denies cough, fever (>100.71F), runny nose, sore throat, difficulty or breathing shortness of breath, and travel in the last 14 days. They state that their caretaker has not had any of the above as well.

## 2019-02-25 ENCOUNTER — Inpatient Hospital Stay (HOSPITAL_COMMUNITY): Payer: Medicare Other | Admitting: Certified Registered Nurse Anesthetist

## 2019-02-25 ENCOUNTER — Inpatient Hospital Stay (HOSPITAL_COMMUNITY): Payer: Medicare Other | Admitting: Physician Assistant

## 2019-02-25 ENCOUNTER — Inpatient Hospital Stay (HOSPITAL_COMMUNITY): Payer: Medicare Other

## 2019-02-25 ENCOUNTER — Other Ambulatory Visit: Payer: Self-pay

## 2019-02-25 ENCOUNTER — Inpatient Hospital Stay (HOSPITAL_COMMUNITY): Payer: Medicare Other | Admitting: Certified Registered"

## 2019-02-25 ENCOUNTER — Encounter (HOSPITAL_COMMUNITY)
Admission: RE | Disposition: A | Discharge status: Home / Self Care | Payer: Self-pay | Source: Home / Self Care | Attending: Surgery

## 2019-02-25 ENCOUNTER — Inpatient Hospital Stay (HOSPITAL_COMMUNITY)
Admission: RE | Admit: 2019-02-25 | Discharge: 2019-02-27 | DRG: 268 | Disposition: A | Discharge status: Home / Self Care | Payer: Medicare Other | Attending: Surgery | Admitting: Surgery

## 2019-02-25 ENCOUNTER — Encounter (HOSPITAL_COMMUNITY): Payer: Self-pay

## 2019-02-25 DIAGNOSIS — Z8679 Personal history of other diseases of the circulatory system: Secondary | ICD-10-CM

## 2019-02-25 DIAGNOSIS — J9601 Acute respiratory failure with hypoxia: Secondary | ICD-10-CM

## 2019-02-25 DIAGNOSIS — I714 Abdominal aortic aneurysm, without rupture: Principal | ICD-10-CM | POA: Diagnosis present

## 2019-02-25 DIAGNOSIS — I9589 Other hypotension: Secondary | ICD-10-CM | POA: Diagnosis present

## 2019-02-25 DIAGNOSIS — J439 Emphysema, unspecified: Secondary | ICD-10-CM | POA: Diagnosis present

## 2019-02-25 DIAGNOSIS — T819XXA Unspecified complication of procedure, initial encounter: Secondary | ICD-10-CM

## 2019-02-25 DIAGNOSIS — Z8249 Family history of ischemic heart disease and other diseases of the circulatory system: Secondary | ICD-10-CM

## 2019-02-25 DIAGNOSIS — I4891 Unspecified atrial fibrillation: Secondary | ICD-10-CM | POA: Diagnosis present

## 2019-02-25 DIAGNOSIS — Z902 Acquired absence of lung [part of]: Secondary | ICD-10-CM

## 2019-02-25 DIAGNOSIS — E039 Hypothyroidism, unspecified: Secondary | ICD-10-CM | POA: Diagnosis present

## 2019-02-25 DIAGNOSIS — J9602 Acute respiratory failure with hypercapnia: Secondary | ICD-10-CM

## 2019-02-25 DIAGNOSIS — Z79899 Other long term (current) drug therapy: Secondary | ICD-10-CM

## 2019-02-25 DIAGNOSIS — Z7982 Long term (current) use of aspirin: Secondary | ICD-10-CM

## 2019-02-25 DIAGNOSIS — N179 Acute kidney failure, unspecified: Secondary | ICD-10-CM | POA: Diagnosis not present

## 2019-02-25 DIAGNOSIS — Z9889 Other specified postprocedural states: Secondary | ICD-10-CM

## 2019-02-25 DIAGNOSIS — J810 Acute pulmonary edema: Secondary | ICD-10-CM | POA: Diagnosis not present

## 2019-02-25 DIAGNOSIS — I5022 Chronic systolic (congestive) heart failure: Secondary | ICD-10-CM | POA: Diagnosis present

## 2019-02-25 DIAGNOSIS — J95821 Acute postprocedural respiratory failure: Secondary | ICD-10-CM | POA: Diagnosis not present

## 2019-02-25 DIAGNOSIS — Z833 Family history of diabetes mellitus: Secondary | ICD-10-CM

## 2019-02-25 DIAGNOSIS — E872 Acidosis: Secondary | ICD-10-CM | POA: Diagnosis not present

## 2019-02-25 DIAGNOSIS — I110 Hypertensive heart disease with heart failure: Secondary | ICD-10-CM | POA: Diagnosis present

## 2019-02-25 DIAGNOSIS — I9581 Postprocedural hypotension: Secondary | ICD-10-CM | POA: Diagnosis not present

## 2019-02-25 DIAGNOSIS — E7800 Pure hypercholesterolemia, unspecified: Secondary | ICD-10-CM | POA: Diagnosis present

## 2019-02-25 DIAGNOSIS — Z961 Presence of intraocular lens: Secondary | ICD-10-CM | POA: Diagnosis present

## 2019-02-25 DIAGNOSIS — F419 Anxiety disorder, unspecified: Secondary | ICD-10-CM | POA: Diagnosis present

## 2019-02-25 DIAGNOSIS — I2721 Secondary pulmonary arterial hypertension: Secondary | ICD-10-CM | POA: Diagnosis present

## 2019-02-25 DIAGNOSIS — Z888 Allergy status to other drugs, medicaments and biological substances status: Secondary | ICD-10-CM | POA: Diagnosis not present

## 2019-02-25 DIAGNOSIS — Z8774 Personal history of (corrected) congenital malformations of heart and circulatory system: Secondary | ICD-10-CM

## 2019-02-25 DIAGNOSIS — Z8673 Personal history of transient ischemic attack (TIA), and cerebral infarction without residual deficits: Secondary | ICD-10-CM

## 2019-02-25 DIAGNOSIS — I255 Ischemic cardiomyopathy: Secondary | ICD-10-CM | POA: Diagnosis present

## 2019-02-25 DIAGNOSIS — Z85118 Personal history of other malignant neoplasm of bronchus and lung: Secondary | ICD-10-CM | POA: Diagnosis not present

## 2019-02-25 DIAGNOSIS — Z87891 Personal history of nicotine dependence: Secondary | ICD-10-CM

## 2019-02-25 DIAGNOSIS — Z9841 Cataract extraction status, right eye: Secondary | ICD-10-CM

## 2019-02-25 DIAGNOSIS — Z8501 Personal history of malignant neoplasm of esophagus: Secondary | ICD-10-CM | POA: Diagnosis not present

## 2019-02-25 DIAGNOSIS — I2510 Atherosclerotic heart disease of native coronary artery without angina pectoris: Secondary | ICD-10-CM | POA: Diagnosis present

## 2019-02-25 DIAGNOSIS — Z951 Presence of aortocoronary bypass graft: Secondary | ICD-10-CM

## 2019-02-25 DIAGNOSIS — Z841 Family history of disorders of kidney and ureter: Secondary | ICD-10-CM

## 2019-02-25 DIAGNOSIS — E785 Hyperlipidemia, unspecified: Secondary | ICD-10-CM | POA: Diagnosis present

## 2019-02-25 DIAGNOSIS — Z95828 Presence of other vascular implants and grafts: Secondary | ICD-10-CM

## 2019-02-25 DIAGNOSIS — Z9842 Cataract extraction status, left eye: Secondary | ICD-10-CM

## 2019-02-25 HISTORY — PX: ABDOMINAL AORTIC ENDOVASCULAR FENESTRATED STENT GRAFT: SHX6430

## 2019-02-25 LAB — POCT I-STAT 7, (LYTES, BLD GAS, ICA,H+H)
Acid-base deficit: 5 mmol/L — ABNORMAL HIGH (ref 0.0–2.0)
Bicarbonate: 20.6 mmol/L (ref 20.0–28.0)
Calcium, Ion: 1.12 mmol/L — ABNORMAL LOW (ref 1.15–1.40)
HCT: 28 % — ABNORMAL LOW (ref 39.0–52.0)
Hemoglobin: 9.5 g/dL — ABNORMAL LOW (ref 13.0–17.0)
O2 Saturation: 100 %
PCO2 ART: 41.1 mmHg (ref 32.0–48.0)
Potassium: 5.8 mmol/L — ABNORMAL HIGH (ref 3.5–5.1)
Sodium: 138 mmol/L (ref 135–145)
TCO2: 22 mmol/L (ref 22–32)
pH, Arterial: 7.307 — ABNORMAL LOW (ref 7.350–7.450)
pO2, Arterial: 305 mmHg — ABNORMAL HIGH (ref 83.0–108.0)

## 2019-02-25 LAB — POCT ACTIVATED CLOTTING TIME
Activated Clotting Time: 202 seconds
Activated Clotting Time: 219 seconds
Activated Clotting Time: 208 seconds
Activated Clotting Time: 213 s
Activated Clotting Time: 180 seconds

## 2019-02-25 LAB — CBC
HCT: 30.7 % — ABNORMAL LOW (ref 39.0–52.0)
Hemoglobin: 9.5 g/dL — ABNORMAL LOW (ref 13.0–17.0)
MCH: 31.5 pg (ref 26.0–34.0)
MCHC: 30.9 g/dL (ref 30.0–36.0)
MCV: 101.7 fL — ABNORMAL HIGH (ref 80.0–100.0)
PLATELETS: 111 10*3/uL — AB (ref 150–400)
RBC: 3.02 MIL/uL — ABNORMAL LOW (ref 4.22–5.81)
RDW: 15.3 % (ref 11.5–15.5)
WBC: 7.9 10*3/uL (ref 4.0–10.5)
nRBC: 0 % (ref 0.0–0.2)

## 2019-02-25 LAB — PROTIME-INR
INR: 1.4 — ABNORMAL HIGH (ref 0.8–1.2)
Prothrombin Time: 17.4 seconds — ABNORMAL HIGH (ref 11.4–15.2)

## 2019-02-25 LAB — BASIC METABOLIC PANEL
Anion gap: 7 (ref 5–15)
BUN: 31 mg/dL — ABNORMAL HIGH (ref 8–23)
CO2: 19 mmol/L — ABNORMAL LOW (ref 22–32)
Calcium: 8.1 mg/dL — ABNORMAL LOW (ref 8.9–10.3)
Chloride: 111 mmol/L (ref 98–111)
Creatinine, Ser: 1.67 mg/dL — ABNORMAL HIGH (ref 0.61–1.24)
GFR calc Af Amer: 45 mL/min — ABNORMAL LOW (ref >60)
GFR calc non Af Amer: 39 mL/min — ABNORMAL LOW (ref >60)
Glucose, Bld: 213 mg/dL — ABNORMAL HIGH (ref 70–99)
POTASSIUM: 6.1 mmol/L — AB (ref 3.5–5.1)
Sodium: 137 mmol/L (ref 135–145)

## 2019-02-25 LAB — POCT I-STAT 4, (NA,K, GLUC, HGB,HCT)
Glucose, Bld: 200 mg/dL — ABNORMAL HIGH (ref 70–99)
HCT: 29 % — ABNORMAL LOW (ref 39.0–52.0)
Hemoglobin: 9.9 g/dL — ABNORMAL LOW (ref 13.0–17.0)
Potassium: 5.7 mmol/L — ABNORMAL HIGH (ref 3.5–5.1)
Sodium: 140 mmol/L (ref 135–145)

## 2019-02-25 LAB — BLOOD GAS, ARTERIAL
Acid-Base Excess: 5.5 mmol/L — ABNORMAL HIGH (ref 0.0–2.0)
Bicarbonate: 27.9 mmol/L (ref 20.0–28.0)
O2 Content: 10 L/min
O2 Saturation: 92.8 %
pH, Arterial: 6.85 — CL (ref 7.350–7.450)
pO2, Arterial: 122 mmHg — ABNORMAL HIGH (ref 83.0–108.0)

## 2019-02-25 LAB — GLUCOSE, CAPILLARY: Glucose-Capillary: 171 mg/dL — ABNORMAL HIGH (ref 70–99)

## 2019-02-25 LAB — MAGNESIUM: Magnesium: 1.8 mg/dL (ref 1.7–2.4)

## 2019-02-25 LAB — APTT: aPTT: 36 seconds (ref 24–36)

## 2019-02-25 SURGERY — ABDOMINAL AORTIC ENDOVASCULAR FENESTRATED STENT GRAFT
Anesthesia: General | Site: Abdomen

## 2019-02-25 MED ORDER — SODIUM CHLORIDE 0.9 % IV SOLN
INTRAVENOUS | Status: DC | PRN
  Administered 2019-02-25 (×2): 500 mL

## 2019-02-25 MED ORDER — ALBUTEROL SULFATE HFA 108 (90 BASE) MCG/ACT IN AERS
INHALATION_SPRAY | RESPIRATORY_TRACT | Status: AC
  Filled 2019-02-25: qty 6.7

## 2019-02-25 MED ORDER — OXYCODONE-ACETAMINOPHEN 5-325 MG PO TABS
1.0000 | ORAL_TABLET | ORAL | Status: DC | PRN
  Administered 2019-02-27: 1 via ORAL
  Filled 2019-02-25: qty 1

## 2019-02-25 MED ORDER — CHLORHEXIDINE GLUCONATE 0.12% ORAL RINSE (MEDLINE KIT): 15.0000 mL | Freq: Two times a day (BID) | OROMUCOSAL | Status: DC

## 2019-02-25 MED ORDER — LISINOPRIL 5 MG PO TABS: 5.0000 mg | ORAL_TABLET | Freq: Every day | ORAL | Status: DC

## 2019-02-25 MED ORDER — EPHEDRINE SULFATE-NACL 50-0.9 MG/10ML-% IV SOSY
PREFILLED_SYRINGE | INTRAVENOUS | Status: DC | PRN
  Administered 2019-02-25 (×3): 10 mg via INTRAVENOUS

## 2019-02-25 MED ORDER — CARVEDILOL 12.5 MG PO TABS
12.5000 mg | ORAL_TABLET | Freq: Two times a day (BID) | ORAL | Status: DC
  Administered 2019-02-26 – 2019-02-27 (×2): 12.5 mg via ORAL
  Filled 2019-02-25 (×2): qty 1

## 2019-02-25 MED ORDER — IODIXANOL 320 MG/ML IV SOLN
INTRAVENOUS | Status: DC | PRN
  Administered 2019-02-25: 14.5 mL via INTRAVENOUS
  Administered 2019-02-25: 100 mL via INTRAVENOUS

## 2019-02-25 MED ORDER — MAGNESIUM SULFATE 2 GM/50ML IV SOLN: 2.0000 g | Freq: Every day | INTRAVENOUS | Status: DC | PRN

## 2019-02-25 MED ORDER — CLOPIDOGREL BISULFATE 75 MG PO TABS
75.0000 mg | ORAL_TABLET | Freq: Every day | ORAL | Status: DC
  Administered 2019-02-26 – 2019-02-27 (×2): 75 mg via ORAL
  Filled 2019-02-25 (×2): qty 1

## 2019-02-25 MED ORDER — PHENOL 1.4 % MT LIQD: 1.0000 | OROMUCOSAL | Status: DC | PRN

## 2019-02-25 MED ORDER — ORAL CARE MOUTH RINSE: 15.0000 mL | Freq: Two times a day (BID) | OROMUCOSAL | Status: DC

## 2019-02-25 MED ORDER — PANTOPRAZOLE SODIUM 40 MG IV SOLR
40.0000 mg | INTRAVENOUS | Status: DC
  Administered 2019-02-25 – 2019-02-26 (×2): 40 mg via INTRAVENOUS
  Filled 2019-02-25 (×2): qty 40

## 2019-02-25 MED ORDER — DEXAMETHASONE SODIUM PHOSPHATE 10 MG/ML IJ SOLN
INTRAMUSCULAR | Status: DC | PRN
  Administered 2019-02-25: 10 mg via INTRAVENOUS

## 2019-02-25 MED ORDER — PROMETHAZINE HCL 25 MG/ML IJ SOLN: 6.2500 mg | INTRAMUSCULAR | Status: DC | PRN

## 2019-02-25 MED ORDER — ONDANSETRON HCL 4 MG/2ML IJ SOLN
4.0000 mg | Freq: Four times a day (QID) | INTRAMUSCULAR | Status: DC | PRN
  Administered 2019-02-25: 4 mg via INTRAVENOUS
  Filled 2019-02-25: qty 2

## 2019-02-25 MED ORDER — ATORVASTATIN CALCIUM 40 MG PO TABS
40.0000 mg | ORAL_TABLET | Freq: Every day | ORAL | Status: DC
  Administered 2019-02-26: 40 mg via ORAL
  Filled 2019-02-25: qty 1

## 2019-02-25 MED ORDER — MORPHINE SULFATE (PF) 2 MG/ML IV SOLN: 2.0000 mg | INTRAVENOUS | Status: DC | PRN

## 2019-02-25 MED ORDER — 0.9 % SODIUM CHLORIDE (POUR BTL) OPTIME
TOPICAL | Status: DC | PRN
  Administered 2019-02-25: 1000 mL

## 2019-02-25 MED ORDER — SODIUM CHLORIDE 0.9 % IV BOLUS: 500.0000 mL | Freq: Once | INTRAVENOUS | Status: DC

## 2019-02-25 MED ORDER — SODIUM CHLORIDE 0.9 % IV SOLN
INTRAVENOUS | Status: DC
  Administered 2019-02-25 (×2): via INTRAVENOUS

## 2019-02-25 MED ORDER — FUROSEMIDE 20 MG PO TABS
20.0000 mg | ORAL_TABLET | Freq: Every day | ORAL | Status: DC
  Administered 2019-02-26 – 2019-02-27 (×2): 20 mg via ORAL
  Filled 2019-02-25 (×2): qty 1

## 2019-02-25 MED ORDER — SODIUM CHLORIDE 0.9 % IV SOLN: 500.0000 mL | Freq: Once | INTRAVENOUS | Status: DC | PRN

## 2019-02-25 MED ORDER — SODIUM CHLORIDE 0.9 % IV SOLN
INTRAVENOUS | Status: AC
  Filled 2019-02-25: qty 1.2

## 2019-02-25 MED ORDER — ACETAMINOPHEN 500 MG PO TABS
1000.0000 mg | ORAL_TABLET | Freq: Once | ORAL | Status: AC
  Administered 2019-02-25: 1000 mg via ORAL
  Filled 2019-02-25: qty 2

## 2019-02-25 MED ORDER — PHENYLEPHRINE HCL-NACL 10-0.9 MG/250ML-% IV SOLN
40.0000 ug/min | INTRAVENOUS | Status: DC
  Administered 2019-02-25: 40 ug/min via INTRAVENOUS
  Administered 2019-02-25: 60 ug/min via INTRAVENOUS

## 2019-02-25 MED ORDER — LABETALOL HCL 5 MG/ML IV SOLN: 10.0000 mg | INTRAVENOUS | Status: DC | PRN

## 2019-02-25 MED ORDER — FUROSEMIDE 10 MG/ML IJ SOLN
20.0000 mg | Freq: Once | INTRAMUSCULAR | Status: AC
  Administered 2019-02-25: 20 mg via INTRAVENOUS

## 2019-02-25 MED ORDER — PHENYLEPHRINE 40 MCG/ML (10ML) SYRINGE FOR IV PUSH (FOR BLOOD PRESSURE SUPPORT)
PREFILLED_SYRINGE | INTRAVENOUS | Status: AC
  Filled 2019-02-25: qty 10

## 2019-02-25 MED ORDER — ROCURONIUM BROMIDE 50 MG/5ML IV SOSY
PREFILLED_SYRINGE | INTRAVENOUS | Status: DC | PRN
  Administered 2019-02-25: 10 mg via INTRAVENOUS
  Administered 2019-02-25: 70 mg via INTRAVENOUS
  Administered 2019-02-25: 10 mg via INTRAVENOUS

## 2019-02-25 MED ORDER — ACETAMINOPHEN 325 MG PO TABS: 325.0000 mg | ORAL_TABLET | ORAL | Status: DC | PRN

## 2019-02-25 MED ORDER — LIDOCAINE 2% (20 MG/ML) 5 ML SYRINGE
INTRAMUSCULAR | Status: DC | PRN
  Administered 2019-02-25: 100 mg via INTRAVENOUS

## 2019-02-25 MED ORDER — FUROSEMIDE 10 MG/ML IJ SOLN
INTRAMUSCULAR | Status: AC
  Filled 2019-02-25: qty 4

## 2019-02-25 MED ORDER — HEPARIN SODIUM (PORCINE) 1000 UNIT/ML IJ SOLN
INTRAMUSCULAR | Status: AC
  Filled 2019-02-25: qty 2

## 2019-02-25 MED ORDER — EPHEDRINE 5 MG/ML INJ
INTRAVENOUS | Status: AC
  Filled 2019-02-25: qty 10

## 2019-02-25 MED ORDER — METOPROLOL TARTRATE 5 MG/5ML IV SOLN: 2.0000 mg | INTRAVENOUS | Status: DC | PRN

## 2019-02-25 MED ORDER — FENTANYL CITRATE (PF) 100 MCG/2ML IJ SOLN: 25.0000 ug | Freq: Once | INTRAMUSCULAR | Status: DC

## 2019-02-25 MED ORDER — HEPARIN SODIUM (PORCINE) 1000 UNIT/ML IJ SOLN
INTRAMUSCULAR | Status: DC | PRN
  Administered 2019-02-25 (×2): 1000 [IU] via INTRAVENOUS
  Administered 2019-02-25: 7000 [IU] via INTRAVENOUS
  Administered 2019-02-25: 2000 [IU] via INTRAVENOUS

## 2019-02-25 MED ORDER — SODIUM CHLORIDE 0.9 % IV SOLN: INTRAVENOUS | Status: DC

## 2019-02-25 MED ORDER — ONDANSETRON HCL 4 MG/2ML IJ SOLN
INTRAMUSCULAR | Status: AC
  Filled 2019-02-25: qty 4

## 2019-02-25 MED ORDER — CHLORHEXIDINE GLUCONATE 4 % EX LIQD: 60.0000 mL | Freq: Once | CUTANEOUS | Status: DC

## 2019-02-25 MED ORDER — FENTANYL CITRATE (PF) 250 MCG/5ML IJ SOLN
INTRAMUSCULAR | Status: AC
  Filled 2019-02-25: qty 5

## 2019-02-25 MED ORDER — CEFAZOLIN SODIUM-DEXTROSE 2-4 GM/100ML-% IV SOLN
2.0000 g | Freq: Three times a day (TID) | INTRAVENOUS | Status: AC
  Administered 2019-02-25 – 2019-02-26 (×2): 2 g via INTRAVENOUS
  Filled 2019-02-25 (×2): qty 100

## 2019-02-25 MED ORDER — PHENYLEPHRINE 40 MCG/ML (10ML) SYRINGE FOR IV PUSH (FOR BLOOD PRESSURE SUPPORT)
PREFILLED_SYRINGE | INTRAVENOUS | Status: DC | PRN
  Administered 2019-02-25: 120 ug via INTRAVENOUS

## 2019-02-25 MED ORDER — MIDAZOLAM HCL 2 MG/2ML IJ SOLN
INTRAMUSCULAR | Status: AC
  Filled 2019-02-25: qty 2

## 2019-02-25 MED ORDER — PROPOFOL 10 MG/ML IV BOLUS
INTRAVENOUS | Status: DC | PRN
  Administered 2019-02-25: 120 mg via INTRAVENOUS

## 2019-02-25 MED ORDER — SODIUM CHLORIDE 0.9 % IV SOLN
INTRAVENOUS | Status: DC | PRN
  Administered 2019-02-25: 40 ug/min via INTRAVENOUS

## 2019-02-25 MED ORDER — HYDRALAZINE HCL 20 MG/ML IJ SOLN: 5.0000 mg | INTRAMUSCULAR | Status: DC | PRN

## 2019-02-25 MED ORDER — PROTAMINE SULFATE 10 MG/ML IV SOLN
INTRAVENOUS | Status: AC
  Filled 2019-02-25: qty 5

## 2019-02-25 MED ORDER — ACETAMINOPHEN 325 MG RE SUPP: 325.0000 mg | RECTAL | Status: DC | PRN

## 2019-02-25 MED ORDER — PROTAMINE SULFATE 10 MG/ML IV SOLN
INTRAVENOUS | Status: DC | PRN
  Administered 2019-02-25: 10 mg via INTRAVENOUS
  Administered 2019-02-25: 40 mg via INTRAVENOUS

## 2019-02-25 MED ORDER — PANTOPRAZOLE SODIUM 40 MG PO TBEC: 40.0000 mg | DELAYED_RELEASE_TABLET | Freq: Every day | ORAL | Status: DC

## 2019-02-25 MED ORDER — ALUM & MAG HYDROXIDE-SIMETH 200-200-20 MG/5ML PO SUSP: 15.0000 mL | ORAL | Status: DC | PRN

## 2019-02-25 MED ORDER — DOCUSATE SODIUM 100 MG PO CAPS
100.0000 mg | ORAL_CAPSULE | Freq: Every day | ORAL | Status: DC
  Administered 2019-02-26 – 2019-02-27 (×2): 100 mg via ORAL
  Filled 2019-02-25 (×2): qty 1

## 2019-02-25 MED ORDER — LACTATED RINGERS IV SOLN
INTRAVENOUS | Status: DC | PRN
  Administered 2019-02-25: 08:00:00 via INTRAVENOUS

## 2019-02-25 MED ORDER — ASPIRIN EC 81 MG PO TBEC
81.0000 mg | DELAYED_RELEASE_TABLET | Freq: Every day | ORAL | Status: DC
  Administered 2019-02-26 – 2019-02-27 (×2): 81 mg via ORAL
  Filled 2019-02-25 (×2): qty 1

## 2019-02-25 MED ORDER — GUAIFENESIN-DM 100-10 MG/5ML PO SYRP: 15.0000 mL | ORAL_SOLUTION | ORAL | Status: DC | PRN

## 2019-02-25 MED ORDER — ONDANSETRON HCL 4 MG/2ML IJ SOLN
INTRAMUSCULAR | Status: DC | PRN
  Administered 2019-02-25: 4 mg via INTRAVENOUS

## 2019-02-25 MED ORDER — DEXAMETHASONE SODIUM PHOSPHATE 10 MG/ML IJ SOLN
INTRAMUSCULAR | Status: AC
  Filled 2019-02-25: qty 1

## 2019-02-25 MED ORDER — CHLORHEXIDINE GLUCONATE 0.12 % MT SOLN
15.0000 mL | Freq: Two times a day (BID) | OROMUCOSAL | Status: DC
  Administered 2019-02-25 – 2019-02-27 (×3): 15 mL via OROMUCOSAL
  Filled 2019-02-25 (×2): qty 15

## 2019-02-25 MED ORDER — ORAL CARE MOUTH RINSE
15.0000 mL | OROMUCOSAL | Status: DC
  Administered 2019-02-25: 15 mL via OROMUCOSAL

## 2019-02-25 MED ORDER — SUGAMMADEX SODIUM 200 MG/2ML IV SOLN
INTRAVENOUS | Status: DC | PRN
  Administered 2019-02-25: 120 mg via INTRAVENOUS

## 2019-02-25 MED ORDER — CEFAZOLIN SODIUM-DEXTROSE 2-4 GM/100ML-% IV SOLN
2.0000 g | INTRAVENOUS | Status: AC
  Administered 2019-02-25: 2 g via INTRAVENOUS
  Filled 2019-02-25: qty 100

## 2019-02-25 MED ORDER — FENTANYL CITRATE (PF) 100 MCG/2ML IJ SOLN: 25.0000 ug | INTRAMUSCULAR | Status: DC | PRN

## 2019-02-25 MED ORDER — ROCURONIUM BROMIDE 50 MG/5ML IV SOSY
PREFILLED_SYRINGE | INTRAVENOUS | Status: AC
  Filled 2019-02-25: qty 10

## 2019-02-25 MED ORDER — DEXMEDETOMIDINE HCL IN NACL 200 MCG/50ML IV SOLN
0.0000 ug/kg/h | INTRAVENOUS | Status: DC
  Administered 2019-02-25: 0.4 ug/kg/h via INTRAVENOUS
  Filled 2019-02-25: qty 50

## 2019-02-25 MED ORDER — ALBUMIN HUMAN 5 % IV SOLN
INTRAVENOUS | Status: DC | PRN
  Administered 2019-02-25 (×2): via INTRAVENOUS

## 2019-02-25 MED ORDER — PROPOFOL 10 MG/ML IV BOLUS
INTRAVENOUS | Status: AC
  Filled 2019-02-25: qty 20

## 2019-02-25 MED ORDER — POTASSIUM CHLORIDE CRYS ER 20 MEQ PO TBCR: 20.0000 meq | EXTENDED_RELEASE_TABLET | Freq: Every day | ORAL | Status: DC | PRN

## 2019-02-25 MED ORDER — POLYETHYLENE GLYCOL 3350 17 G PO PACK: 17.0000 g | PACK | Freq: Every day | ORAL | Status: DC | PRN

## 2019-02-25 MED ORDER — FENTANYL CITRATE (PF) 250 MCG/5ML IJ SOLN
INTRAMUSCULAR | Status: DC | PRN
  Administered 2019-02-25: 100 ug via INTRAVENOUS
  Administered 2019-02-25: 50 ug via INTRAVENOUS
  Administered 2019-02-25 (×2): 25 ug via INTRAVENOUS

## 2019-02-25 SURGICAL SUPPLY — 83 items
ADH SKN CLS APL DERMABOND .7 (GAUZE/BANDAGES/DRESSINGS) ×2
BAG DECANTER FOR FLEXI CONT (MISCELLANEOUS) IMPLANT
BAG SNAP BAND KOVER 36X36 (MISCELLANEOUS) ×2 IMPLANT
BALLN CODA OCL 2-9.0-35-120-3 (BALLOONS) ×3
BALLN MUSTANG 8X20X75 (BALLOONS) ×3
BALLOON COD OCL 2-9.0-35-120-3 (BALLOONS) IMPLANT
BALLOON MUSTANG 8X20X75 (BALLOONS) IMPLANT
CANISTER SUCT 3000ML PPV (MISCELLANEOUS) ×3 IMPLANT
CATH ANGIO 5F BER2 100CM (CATHETERS) ×2 IMPLANT
CATH ANGIO 5F BER2 65CM (CATHETERS) ×2 IMPLANT
CATH DIAG 6FR PIGTAIL (CATHETERS) ×2 IMPLANT
CATH OMNI FLUSH .035X70CM (CATHETERS) ×2 IMPLANT
CATH QUICKCROSS SUPP .035X90CM (MICROCATHETER) ×2 IMPLANT
COVER WAND RF STERILE (DRAPES) ×1 IMPLANT
DERMABOND ADVANCED (GAUZE/BANDAGES/DRESSINGS) ×4
DERMABOND ADVANCED .7 DNX12 (GAUZE/BANDAGES/DRESSINGS) ×1 IMPLANT
DEVICE CLOSURE PERCLS PRGLD 6F (VASCULAR PRODUCTS) IMPLANT
DEVICE TORQUE H2O (MISCELLANEOUS) ×2 IMPLANT
DRAIN CHANNEL 10F 3/8 F FF (DRAIN) IMPLANT
DRAIN CHANNEL 10M FLAT 3/4 FLT (DRAIN) IMPLANT
DRAPE ZERO GRAVITY STERILE (DRAPES) ×3 IMPLANT
DRSG TEGADERM 2-3/8X2-3/4 SM (GAUZE/BANDAGES/DRESSINGS) ×6 IMPLANT
DRYSEAL FLEXSHEATH 20FR 33CM (SHEATH) ×2
ELECT CAUTERY BLADE 6.4 (BLADE) ×2 IMPLANT
ELECT REM PT RETURN 9FT ADLT (ELECTROSURGICAL) ×6
ELECTRODE REM PT RTRN 9FT ADLT (ELECTROSURGICAL) ×2 IMPLANT
EVACUATOR 3/16  PVC DRAIN (DRAIN)
EVACUATOR 3/16 PVC DRAIN (DRAIN) IMPLANT
EVACUATOR SILICONE 100CC (DRAIN) IMPLANT
GAUZE SPONGE 2X2 8PLY STRL LF (GAUZE/BANDAGES/DRESSINGS) ×2 IMPLANT
GLIDEWIRE ANGLED NITR .018X260 (WIRE) IMPLANT
GLOVE BIOGEL PI IND STRL 7.5 (GLOVE) ×1 IMPLANT
GLOVE BIOGEL PI INDICATOR 7.5 (GLOVE) ×2
GLOVE SURG SS PI 7.5 STRL IVOR (GLOVE) ×3 IMPLANT
GOWN STRL REUS W/ TWL LRG LVL3 (GOWN DISPOSABLE) ×2 IMPLANT
GOWN STRL REUS W/ TWL XL LVL3 (GOWN DISPOSABLE) ×1 IMPLANT
GOWN STRL REUS W/TWL LRG LVL3 (GOWN DISPOSABLE) ×6
GOWN STRL REUS W/TWL XL LVL3 (GOWN DISPOSABLE) ×3
GRAFT BALLN CATH 65CM (STENTS) IMPLANT
GRAFT FEN DIST EVAR 12X28X76 (Graft) ×2 IMPLANT
GRAFT FEN PROX EVAR 26X124M (Graft) ×2 IMPLANT
GRAFT LEG ILIAC  ZSLE-11-90-ZT (Endovascular Graft) ×4 IMPLANT
GRAFT LEG ILIAC ZSLE-11-90-ZT (Endovascular Graft) IMPLANT
GUIDEWIRE ANGLED .035X260CM (WIRE) ×2 IMPLANT
HEMOSTAT SNOW SURGICEL 2X4 (HEMOSTASIS) IMPLANT
KIT BASIN OR (CUSTOM PROCEDURE TRAY) ×3 IMPLANT
KIT ENCORE 26 ADVANTAGE (KITS) ×4 IMPLANT
KIT TURNOVER KIT B (KITS) ×3 IMPLANT
NDL PERC 18GX7CM (NEEDLE) ×1 IMPLANT
NEEDLE PERC 18GX7CM (NEEDLE) IMPLANT
NS IRRIG 1000ML POUR BTL (IV SOLUTION) ×3 IMPLANT
PACK ENDOVASCULAR (PACKS) ×3 IMPLANT
PAD ARMBOARD 7.5X6 YLW CONV (MISCELLANEOUS) ×6 IMPLANT
PENCIL BUTTON HOLSTER BLD 10FT (ELECTRODE) ×2 IMPLANT
PERCLOSE PROGLIDE 6F (VASCULAR PRODUCTS)
SET MICROPUNCTURE 5F STIFF (MISCELLANEOUS) ×2 IMPLANT
SHEATH AVANTI 11CM 8FR (SHEATH) ×2 IMPLANT
SHEATH BRITE TIP 8FR 23CM (SHEATH) ×2 IMPLANT
SHEATH DRYSEAL FLEX 20FR 33CM (SHEATH) IMPLANT
SHEATH GUIDING 7F 55X73X9MM TD (SHEATH) ×2 IMPLANT
SHEATH HIGHFLEX ANSEL 7FR 55CM (SHEATH) ×2 IMPLANT
SHIELD RADPAD SCOOP 12X17 (MISCELLANEOUS) ×6 IMPLANT
SPONGE GAUZE 2X2 STER 10/PKG (GAUZE/BANDAGES/DRESSINGS)
STENT GRAFT BALLN CATH 65CM (STENTS)
STENT VIABAHN 6X19X135 VBX (Permanent Stent) ×2 IMPLANT
STENT VIABAHN VBX 5X29X135 (Permanent Stent) ×2 IMPLANT
STOPCOCK MORSE 400PSI 3WAY (MISCELLANEOUS) ×3 IMPLANT
SUT ETHILON 3 0 PS 1 (SUTURE) IMPLANT
SUT PROLENE 5 0 C 1 24 (SUTURE) IMPLANT
SUT SILK 2 0 SH (SUTURE) ×1 IMPLANT
SUT VIC AB 2-0 CT1 27 (SUTURE)
SUT VIC AB 2-0 CT1 TAPERPNT 27 (SUTURE) IMPLANT
SUT VIC AB 3-0 SH 27 (SUTURE)
SUT VIC AB 3-0 SH 27X BRD (SUTURE) IMPLANT
SUT VIC AB 4-0 PS2 18 (SUTURE) ×2 IMPLANT
SUT VICRYL 4-0 PS2 18IN ABS (SUTURE) ×2 IMPLANT
SYR 30ML LL (SYRINGE) IMPLANT
TOWEL GREEN STERILE (TOWEL DISPOSABLE) ×3 IMPLANT
TRAY FOLEY MTR SLVR 16FR STAT (SET/KITS/TRAYS/PACK) ×3 IMPLANT
TUBING HIGH PRESSURE 120CM (CONNECTOR) ×3 IMPLANT
WIRE BENTSON .035X145CM (WIRE) ×6 IMPLANT
WIRE ROSEN-J .035X260CM (WIRE) ×2 IMPLANT
WIRE STIFF LUNDERQUIST 260MM (WIRE) ×2 IMPLANT

## 2019-02-25 NOTE — Anesthesia Procedure Notes (Signed)
Procedure Name: Intubation Date/Time: 02/25/2019 2:27 PM Performed by: Hollister Wessler T, CRNA Pre-anesthesia Checklist: Patient identified, Emergency Drugs available, Suction available and Patient being monitored Patient Re-evaluated:Patient Re-evaluated prior to induction Oxygen Delivery Method: Ambu bag Preoxygenation: Pre-oxygenation with 100% oxygen Ventilation: Mask ventilation without difficulty and Oral airway inserted - appropriate to patient size Laryngoscope Size: Miller and 3 Grade View: Grade I Tube type: Oral Tube size: 8.0 mm Number of attempts: 1 Airway Equipment and Method: Patient positioned with wedge pillow and Stylet Placement Confirmation: ETT inserted through vocal cords under direct vision,  positive ETCO2,  CO2 detector and breath sounds checked- equal and bilateral Secured at: 23 cm Tube secured with: Tape Dental Injury: Teeth and Oropharynx as per pre-operative assessment

## 2019-02-25 NOTE — Progress Notes (Signed)
Status post fenestrated endovascular aneurysm repair.  Patient is stable in PACU.  His groins are soft and he has Doppler signals in both posterior tibial arteries.  Annamarie Major

## 2019-02-25 NOTE — Progress Notes (Signed)
Dr. Trula Slade, [facility] CRNA and Dr. Enid Cutter aware that pt 8cc VT 447ml. Okay to leave pt on current VT of 559ml. Repeat ABG drawn off a-line by RN and ran by the CRNA at this time. No vent changes made. Pt stable and breathing over vent at this time.

## 2019-02-25 NOTE — Op Note (Signed)
Patient name: Glen Green MRN: [id number] DOB: 03-Jul-1941 Sex: male  02/25/2019 Pre-operative Diagnosis: Juxtarenal abdominal aortic aneurysm Post-operative diagnosis:  Same Surgeon:  Annamarie Major Assistants: Arlee Muslim Procedure:   #1: Fenestrated endovascular repair of juxtarenal abdominal aortic aneurysm   #2: Stent, right renal artery   #3: Stent, left renal artery   #4: Bilateral ultrasound-guided common femoral artery access   #5: Abdominal aortogram and selective bilateral renal angiograms Anesthesia: General Blood Loss: Minimal Specimens: None  Findings: Complete exclusion  Indications: This is a 78 year old gentleman who had been following for a juxtarenal abdominal aortic aneurysm.  Aortic diameter is 5.2 cm which has increased 2 mm over his last scans.  The patient strongly desires to get this fixed.  We discussed the risks and benefits of fenestrated repair including the risk of a renal insufficiency, distal embolization, intestinal ischemia, and cardiopulmonary complications.  All his questions were answered and he wished to proceed with repair.  Devices used: Lacinda Axon P2-26-124.  D12-28-76.  Right ZS LE 11 x 90.  Left ZS LE 11 x 90.  Right renal stent: VBX 6 x 19.  Left renal stent VBX 5 x 29  Procedure:  The patient was identified in the holding area and taken to [place] 16  The patient was then placed supine on the table. general anesthesia was administered.  The patient was prepped and draped in the usual sterile fashion.  A time out was called and antibiotics were administered.  Ultrasound was used to evaluate bilateral common femoral arteries.  They had posterior calcification.  A 11 blade was used to make a skin nick.  Bilateral common femoral arteries were cannulated under ultrasound guidance with a micropuncture needle.  An 018 wire was advanced without resistance and the micropuncture sheath was placed.  A Bentson wire was then inserted and the subcutaneous tract  was dilated with an 8 [place] dilator.  Pro-glide devices were deployed at the 11:00 and 1 o'clock position for pre-closure and 8 French sheaths were placed bilaterally.  The patient was fully heparinized.  Heparin levels were monitored and redosed based on ACT measurements.  A Lunderquist wire was advanced up the left side.  The main body device was prepared on the back table.  This was a Langenfeld P2-26-124.  It was oriented on the table and then inserted after removing the 8 [place] sheath.  A Omni Flush catheter was advanced up the right side and positioned at the level of L1 and an abdominal aortogram was performed locating the renal arteries and the superior mesenteric artery.  I-guide software was used to orient the vessels based on bony landmarks and angiographic findings.  I then deployed the top 2 stents and performed an additional arteriogram to make sure that the device was properly lined up.  The remaining portion of the proximal piece was then deployed.  From the right side I then cannulated the bottom half of the proximal device with a Bentson wire and a Kumpe catheter.  Once I had cannulated this, a Lunderquist wire was inserted.  I then advanced a 20 French dry seal sheath from the right side into the proximal device.  Next, I used a Oscore deflectable sheath to cannulate the left renal artery.  I then advanced a Glidewire out into the right renal artery followed by a quick cross catheter.  A contrast injection was performed with a quick cross catheter, confirming that I had cannulated the left renal artery.  The  Glidewire was removed and a Rosen wire was inserted.  The Oscore sheath was then removed and replaced with a 7 French 55 cm high flex Ansell sheath.  A second access through the 20 French dry seal sheath was obtained and the 7 [place] Oscore sheath was then inserted.  This was used to cannulate the right renal artery.  A Glidewire was then advanced out into the right renal artery and a quick cross  catheter was inserted.  Contrast injections were performed to the quick cross catheter confirming that at cannulated the renal artery on the right.  A Rosen wire was then inserted.  Next, I advanced a 5 x 29 VBX stent out the Ansell 1 sheath in the left renal artery.  A 6 x 19 VBX stent was advanced out the Oscore sheath in the right renal artery.  I then released the top And constraining loops on the proximal Jasper device.  A Coda balloon was then inserted and used to mold the top to ceiling stents.  Once this was done the renal stents were deployed leaving approximately 3-4 mm of the stent in the aorta.  Both stents were visited using a 8 x 20 Mustang balloon.  Angiography was then performed through the Oscore and Ansell sheath which demonstrated patent renal arteries bilaterally and good position of the stents.  Next, the distal main body device was inserted after removing the proximal device.  The distal device was a D12-28-76 device.  Once I had this in position and made sure that I did not disrupt the renal stents, I gave up access into both renal arteries.  I then performed angiography through the dry seal sheath in the right groin to locate the aortic bifurcation.  The distal device was oriented properly and then deployed down to the contralateral gate.  The contralateral gate was then cannulated with a Berenstein catheter and a Glidewire.  Once it was cannulated I removed the Glidewire and inserted a Lunderquist wire.  A Coda balloon was then inserted and inflated within the contralateral gate.  It did assume a mushroomlike shape confirming that I had successfully cannulated the gate.  Next, the image detector was rotated to a left anterior oblique position and a retrograde injection was performed through the sheath in the right groin locating the right hypogastric artery.  The right iliac limb was selected.  This was a Sowash Z SLE 11 x 90.  It was inserted through the dry seal sheath and deployed landing  proximal to the hypogastric artery.  Next, the remaining portion of the ipsilateral limb was deployed.  The image detector was rotated to the right anterior oblique position and a retrograde injection was performed to the sheath in the left groin locating the left hypogastric artery.  I then inserted the left ipsilateral extension which was a Mcnee Z SLE 11 x 90.  This was deployed landing just proximal to the left hypogastric artery.  Next, a Coda balloon was used to mold device overlaps as well as both iliac limbs.  A completion arteriogram was then performed.  This showed successful exclusion of the aneurysm sac.  Bilateral renal arteries were widely patent.  Both iliac arteries are widely patent.  The superior mesenteric artery was widely patent.  At this point I saw no need to consider stenting of the superior mesenteric artery as this was widely patent on angiography.  The Lunderquist wires were exchanged out for Bentson wires.  The sheaths were then removed and the  pro-glide devices were used to close the arteriotomy sites.  50 mg of protamine was then administered to reverse the heparin.  Manual pressure was held on both groins until they were hemostatic.  I then used cautery within the skin nick and placed Dermabond.  Hand-held Doppler was used to identify pedal Doppler signals which were similar to his preoperative exam.  He also had multiphasic femoral artery signals distal to the cannulation site.  The patient was then successfully extubated and taken to recovery in stable condition.  There were no immediate complications.   Disposition: To PACU stable.   Theotis Burrow, M.D., [facility] [place] Office: [phone] Pager:  [phone]

## 2019-02-25 NOTE — Addendum Note (Signed)
Addendum  created 02/25/19 1510 by Roderic Palau, MD   Clinical Note Signed

## 2019-02-25 NOTE — Progress Notes (Signed)
Repeat ABG much improved with pH 7.30 and PCO2 40. Pt moving and responding to commands. His BP is being supported with phenylephrine. D/w Dr. Trula Slade. Plan is to leave patient intubated for now and send him to the ICU and hopefully extubate when more awake and more hemodynamically stable.

## 2019-02-25 NOTE — Addendum Note (Signed)
Addendum  created 02/25/19 1445 by Roderic Palau, MD   Delete clinical note

## 2019-02-25 NOTE — Progress Notes (Signed)
Called to PACU for ventilator.  Upon arrival, CRNA had intubated patient with 8.0 ETT secured at 23 at the lip.  Placed patient on Servo I with these settings:  RR 20, VT 520 ml, FiO2 100%, PEEP 5.  Patient tolerating settings well.  Will continue to monitor.

## 2019-02-25 NOTE — Progress Notes (Signed)
PCCM INTERVAL PROGRESS NOTE   Evaluated patient on 5/5 pressure support wean. He is tolerating well with tidal volumes in the 350 range with a respiratory rate of if 10-12. He easily arouses and follows commands on 0.2 Precedex.   Will plan to extubate.   Georgann Housekeeper, AGACNP-BC [place] Pager [phone] or [phone]  02/25/2019 6:16 PM

## 2019-02-25 NOTE — Anesthesia Postprocedure Evaluation (Deleted)
Anesthesia Post Note  Patient: Glen Green  Procedure(s) Performed: ABDOMINAL AORTIC ENDOVASCULAR FENESTRATED STENT GRAFT (N/A Abdomen)     Patient location during evaluation: PACU Anesthesia Type: General Level of consciousness: awake and alert Pain management: pain level controlled Vital Signs Assessment: post-procedure vital signs reviewed and stable Respiratory status: spontaneous breathing, nonlabored ventilation, respiratory function stable and patient connected to nasal cannula oxygen Cardiovascular status: blood pressure returned to baseline and stable Postop Assessment: no apparent nausea or vomiting Anesthetic complications: no    Last Vitals:  Vitals:   02/25/19 1259 02/25/19 1314  BP: 98/69 106/75  Pulse: 87 86  Resp: 11 17  Temp:    SpO2: 92% 100%    Last Pain:  Vitals:   02/25/19 1330  TempSrc:   PainSc: Asleep                 Jamaul Heist,W. EDMOND

## 2019-02-25 NOTE — H&P (Signed)
[facility] [place]  Patient name: Glen Green            MRN: [id number]        DOB: 04-Sep-1941          Sex: male   REASON FOR VISIT:    Follow up  HISOTRY OF PRESENT ILLNESS:    Glen Green a 78 y.o.malewho returns today for follow-up of his abdominal aortic aneurysm.  He is a candidate for [facility] and is here for pre-operative discussions.  The patient has a history of an ischemic heart disease. He presented in August of 2014 with shock and heart failure and was found to have an occluded LAD and circumflex. He underwent emergent cardiac bypass surgery. 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  He has a history of esophageal cancer, status post distal esophagectomy and 2004, followed by chemotherapy. He is also status post right adrenalectomy and left upper lobectomy in 2011, presumably for metastatic disease.  He suffers from hypercholesterolemia, treated with a statin. He has a history of smoking but quit in 2004. He also has a history of a ruptured cerebral aneurysm.  The patient does complain of cramping in his legs with walking. He is a diabetic. Most recent A1c is 6.9   PAST MEDICAL HISTORY:       Past Medical History:  Diagnosis Date  . AAA (abdominal aortic aneurysm) ([place]) 08/22/2012   3.9 cm by CT  - June 2013, stable   . 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 Atrial fibrillation ([place])   . CAD (coronary artery disease)   . Cancer ([place])    Esophageal, adrenal gland, skin; lung  . Carotid artery occlusion   . Cerebral aneurysm    [place]. repair  1994  . Dyslipidemia   . History of esophageal cancer    s/p transhiatal esophagogastrectomy  . History of lung cancer   . Hypothyroidism   . Pneumonia   . Postoperative atrial fibrillation ([place]) 12/04/2013   Short course of amiodarone, resolved. Postop bypass  . SBO (small bowel obstruction) ([place]) 08/15/2012   History of  small bowel obstruction (status post small bowel       resection, lysis of adhesions, incidental appendectomy, and repair       of left diaphragmatic hernia  . Stroke [facility]) 1995   denies residual     FAMILY HISTORY:        Family History  Problem Relation Age of Onset  . Aneurysm Mother   . Kidney disease Father   . Heart disease Maternal Uncle   . Heart disease Paternal Uncle   . Aneurysm Maternal Grandmother   . Diabetes Sister     SOCIAL HISTORY:   Social History        Tobacco Use  . Smoking status: Former Smoker    Types: Cigarettes    Last attempt to quit: 12/10/2002    Years since quitting: 16.1  . Smokeless tobacco: Never Used  Substance Use Topics  . Alcohol use: No     ALLERGIES:        Allergies  Allergen Reactions  . Quinolones     Patient was warned about not using Cipro and similar antibiotics. Recent studies have raised concern that fluoroquinolone antibiotics could be associated with an increased risk of aortic aneurysm Fluoroquinolones have non-antimicrobial properties that might jeopardise the integrity of the extracellular matrix of the vascular wall In a  propensity score matched cohort  study in [place], there was a 66% increased rate of aortic aneurysm or dissection associated with oral fluoroquinolone use, compared wit     CURRENT MEDICATIONS:         Current Outpatient Medications  Medication Sig Dispense Refill  . aspirin 81 MG tablet Take 1 tablet (81 mg total) by mouth daily. 30 tablet 0  . atorvastatin (LIPITOR) 40 MG tablet TAKE 1 TABLET (40 MG TOTAL) BY MOUTH DAILY AT 6 PM. 90 tablet 0  . carvedilol (COREG) 12.5 MG tablet TAKE 1 TABLET (12.5 MG TOTAL) BY MOUTH 2 (TWO) TIMES DAILY 180 tablet 3  . diphenhydramine-acetaminophen (TYLENOL PM) 25-500 MG TABS tablet Take 2 tablets by mouth at bedtime as needed (for sleep better).    . furosemide (LASIX) 20 MG tablet Take 1 tablet (20 mg total) by mouth  daily. 30 tablet 11  . guaiFENesin (MUCINEX) 600 MG 12 hr tablet Take 1,200 mg by mouth 2 (two) times daily as needed for congestion.    Marland Kitchen lisinopril (PRINIVIL,ZESTRIL) 5 MG tablet TAKE 1 TABLET BY MOUTH EVERY DAY 90 tablet 1  . Tiotropium Bromide-Olodaterol 2.5-2.5 MCG/ACT AERS INHALE 2 PUFFS BY MOUTH INTO THE LUNGS DAILY 1 Inhaler 0   No current facility-administered medications for this visit.     REVIEW OF SYSTEMS:   [X]  denotes positive finding, [ ]  denotes negative finding Cardiac  Comments:  Chest pain or chest pressure:    Shortness of breath upon exertion:    Short of breath when lying flat:    Irregular heart rhythm:        Vascular    Pain in calf, thigh, or hip brought on by ambulation: x   Pain in feet at night that wakes you up from your sleep:     Blood clot in your veins:    Leg swelling:         Pulmonary    Oxygen at home:    Productive cough:     Wheezing:         Neurologic    Sudden weakness in arms or legs:     Sudden numbness in arms or legs:     Sudden onset of difficulty speaking or slurred speech:    Temporary loss of vision in one eye:     Problems with dizziness:         Gastrointestinal    Blood in stool:     Vomited blood:         Genitourinary    Burning when urinating:     Blood in urine:        Psychiatric    Major depression:         Hematologic    Bleeding problems:    Problems with blood clotting too easily:        Skin    Rashes or ulcers:        Constitutional    Fever or chills:      PHYSICAL EXAM:      Vitals:   01/26/19 0832  Weight: 138 lb 5.4 oz (62.8 kg)  Height: 5\' 10"  (1.778 m)    GENERAL: The patient is a well-nourished male, in no acute distress. The vital signs are documented above. CARDIAC: There is a regular rate and rhythm.  PULMONARY: Non-labored respirations ABDOMEN: Soft and non-tender   MUSCULOSKELETAL: There are no major deformities or cyanosis. NEUROLOGIC: No focal weakness or paresthesias are detected. SKIN: There are no ulcers or rashes noted. PSYCHIATRIC:  The patient has a normal affect.  STUDIES:   Carotid:  40-59% right, 1-39% left   1.5 hours were spent on Tera Recon for device planning MEDICAL ISSUES:   AAA:  I discussed the details of FEVAR including the potential complications of renal issues, intestinal ischemia, lower extremity ischemia, and cardio-pulmonary issues.  All of their questions were answered  Carotid:  Follow up in 1 year  Annamarie Major, MD [facility] [phone] Pager [phone]

## 2019-02-25 NOTE — Procedures (Signed)
Extubation Procedure Note  Patient Details:   Name: Glen Green DOB: [birth year] MRN: [id number]   Airway Documentation:  Airway 8 mm (Active)  Secured at (cm) 23 cm 02/25/2019  2:15 PM  Measured From Lips 02/25/2019  2:15 PM  Secured Location Right 02/25/2019  2:15 PM  Secured By [employer] 02/25/2019  2:15 PM  Tube Holder Repositioned Yes 02/25/2019  2:15 PM   Vent end date: 02/25/19 Vent end time: 1822   Evaluation  O2 sats: stable throughout Complications: No apparent complications Patient did tolerate procedure well. Bilateral Breath Sounds: Clear, Diminished   Yes   Patient was extubated to a 2L [place] per MD order. Patient had cuff leak and not stridor was noted. Patient is tolerating well at this time.  [place] 02/25/2019, 6:24 PM

## 2019-02-25 NOTE — Transfer of Care (Signed)
Immediate Anesthesia Transfer of Care Note  Patient: Glen Green  Procedure(s) Performed: AN AD HOC INTUBATION  Patient Location: PACU  Anesthesia Type:General  Level of Consciousness: unresponsive  Airway & Oxygen Therapy: Patient placed on Ventilator (see vital sign flow sheet for setting)  Post-op Assessment: Report given to RN and Post -op Vital signs reviewed and stable  Post vital signs: Reviewed and stable  Last Vitals:  Vitals Value Taken Time  BP 80/59 02/25/2019  2:59 PM  Temp    Pulse 73 02/25/2019  3:04 PM  Resp 22 02/25/2019  3:04 PM  SpO2 94 % 02/25/2019  3:04 PM  Vitals shown include unvalidated device data.  Last Pain:  Vitals:   02/25/19 1400  TempSrc:   PainSc: Asleep      Patients Stated Pain Goal: 2 (87/86/76 7209)  Complications: No apparent anesthesia complications

## 2019-02-25 NOTE — Consult Note (Signed)
NAME:  Glen Green, MRN:  [id number], DOB:  04/05/1941, LOS: 0 ADMISSION DATE:  02/25/2019, CONSULTATION DATE:  02/25/19 REFERRING MD: Trula Slade , CHIEF COMPLAINT:  Post-operative respiratory failure   Brief History   78 year old M POD 0 FEVAR. In PACU patient exhibited respiratory failure after extubation, was not arousable and with respiratory acidosis. He was reintubated and will be placed in ICU.   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   Post-operatively, the patient was extubated in PACU. In PACU, the patient was not arousable. He was hemodynamically stable per anesthesia record, but had revere respiratory acidosis on ABG. He was reintubated. Follow up ABG shows improvement in pH and pCO2. The patient will be admitted to ICU, with the plan to extubate when more awake.   PCCM consulted for ICU vent needs.    Past Medical History   AAA s/p FEVAR  CAD Ischemic CM Emphysema Secondary pulmonary artery hypertension HLD HTN Atrial Fibrillation Squamous cell lung cancer s/p LUL lobectomy Esophageal cancer s/p transhiatal esophagogastrectomy Adrenal tumor s/p adrenalectomy Hypothyroidism  CVA   Significant Hospital Events   3/18 FEVAR. Reintubated in PACU. Admit to ICU   Consults:  PCCM Procedures:  3/18 Intubation >   Significant Diagnostic Tests:  CXR 3/18> ETT 3cm above carina. small bilateral pleural effusions. Emphysema. Interstitial opacifications   Micro Data:    Antimicrobials:  Ancef 3/18 x1  Interim history/subjective:  Reintubated in PACU s/p FEVAR Transferring to ICU for further management   Objective   Blood pressure 104/61,  pulse 62, temperature 97.7 F (36.5 C), resp. rate (!) 25, height 5\' 2"  (1.575 m), weight 62.6 kg, SpO2 100 %.    Vent Mode: PRVC FiO2 (%):  [100 %] 100 % Set Rate:  [20 bmp] 20 bmp Vt Set:  [520 mL] 520 mL PEEP:  [5 cmH20] 5 cmH20 Plateau Pressure:  [16 cmH20] 16 cmH20   Intake/Output Summary (Last 24 hours) at 02/25/2019 1551 Last data filed at 02/25/2019 1549 Gross per 24 hour  Intake 2700 ml  Output 845 ml  Net 1855 ml   Filed Weights   02/25/19 0701  Weight: 62.6 kg    Examination: General: older adult male, intubated, awake, laying in bed HENT: NCAT, ETT secure, trachea midline, anicteric sclera, pink mmm Lungs: CTA bilaterally. No accessory muscle recruitment  Cardiovascular: RRR s1s2, no JVD, no r/g/m, 1+ radial pulses Abdomen: soft, round, ndnt, + bowel sounds x4 Extremities: Symmetrical bulk and tone. No edema, no tenderness.  Neuro: Awake, alert, following commands. PERRL Skin: clean, dry, warm, intact.   Resolved Hospital Problem list     Assessment & Plan:   AAA s/p FEVAR P Post-operative care per Vascular Surgery   Acute post-operative respiratory failure with hypercarbia  -re-intubated in PACU  -Underlying emphysema -secondary pulmonary artery hypertension  P -will start Precedex for patient, at low dose  -PSV/CPAP -assess patient for readiness to extubate (patient is more awake now) -If unable to extubate today, will add sedation for overnight and WUA/SBT in morning  Hypotension, resolved  -arrived from PACU on neo -likely in setting of sedation P -MAP goal >65 -Patient off neo now  HTN Holding  Coreg, lisinopril while intubated Lasix qD PRN hydralazine, PRN metoprolol   HLD Holding lipitor while intubated   Atrial Fibrillation, rate controlled  Holding plavix  Continue telemetry  Constipation, at risk Holding miralax, colace while intubated Can add PRN suppository if unable to extubate tonight Do not favor placing OGT/NGT as we  anticipate patient will be extubated this afternoon vs tomorrow morning    Rest per primary  Best practice:  Diet: NPO  Pain/Anxiety/Delirium protocol (if indicated): precedex  VAP protocol (if indicated): yes  DVT prophylaxis: SCDs GI prophylaxis: protonix  Glucose control: monitor  Mobility: bedrest Code Status: Full  Family Communication: Wife updated at bedside  Disposition: admit to ICU   Labs   CBC: Recent Labs  Lab 02/25/19 1520 02/25/19 1524  HGB 9.9* 9.5*  HCT 29.0* 28.0*    Basic Metabolic Panel: Recent Labs  Lab 02/25/19 1520 02/25/19 1524  NA 140 138  K 5.7* 5.8*  GLUCOSE 200*  --    GFR: Estimated Creatinine Clearance: 34.4 mL/min (A) (by C-G formula based on SCr of 1.41 mg/dL (H)). No results for input(s): PROCALCITON, WBC, LATICACIDVEN in the last 168 hours.  Liver Function Tests: No results for input(s): AST, ALT, ALKPHOS, BILITOT, PROT, ALBUMIN in the last 168 hours. No results for input(s): LIPASE, AMYLASE in the last 168 hours. No results for input(s): AMMONIA in the last 168 hours.  ABG    Component Value Date/Time   PHART 7.307 (L) 02/25/2019 1524   PCO2ART 41.1 02/25/2019 1524   PO2ART 305.0 (H) 02/25/2019 1524   HCO3 20.6 02/25/2019 1524   TCO2 22 02/25/2019 1524   ACIDBASEDEF 5.0 (H) 02/25/2019 1524   O2SAT 100.0 02/25/2019 1524     Coagulation Profile: No results for input(s): INR, PROTIME in the last 168 hours.  Cardiac Enzymes: No results for input(s): CKTOTAL, CKMB, CKMBINDEX, TROPONINI in the last 168 hours.  HbA1C: Hgb A1c MFr Bld  Date/Time Value Ref Range Status  07/30/2013 05:40 PM 6.5 (H) <5.7 % Final    Comment:    (NOTE)                                                                       According to the ADA Clinical Practice Recommendations for 2011, when HbA1c is used as a screening test:  >=6.5%   Diagnostic of Diabetes Mellitus           (if abnormal result is confirmed) 5.7-6.4%   Increased risk of  developing Diabetes Mellitus References:Diagnosis and Classification of Diabetes Mellitus,Diabetes MPNT,6144,31(VQMGQ 1):S62-S69 and Standards of Medical Care in         Diabetes - 2011,Diabetes QPYP,9509,32 (Suppl 1):S11-S61.  06/08/2009 06:05 PM (H) 4.6 - 6.1 % Final   6.2 (NOTE) The ADA recommends the following therapeutic goal for glycemic control related to Hgb A1c measurement: Goal of therapy: <6.5 Hgb A1c  Reference: American Diabetes Association: Clinical Practice Recommendations 2010, Diabetes Care, 2010, 33: (Suppl  1).    CBG: Recent Labs  Lab 02/25/19 1411  GLUCAP 171*    Review of Systems:   Unable to obtain due to patient factors, intubated   Past Medical History  He,  has a past medical history of AAA (abdominal aortic aneurysm) (  [place]) (08/22/2012), Adrenal tumor (08/15/2012), Atrial fibrillation ([place]), CAD (coronary artery disease), Cancer ([place]), Carotid artery occlusion, Cerebral aneurysm, Dyslipidemia, History of esophageal cancer, History of lung cancer, Hypothyroidism, Pneumonia, Postoperative atrial fibrillation ([place]) (12/04/2013), SBO (small bowel obstruction) ([place]) (08/15/2012), and Stroke ([place]) (1995).   Surgical History    Past Surgical History:  Procedure Laterality Date  . BRAIN SURGERY     brain aneursyn  . CATARACT EXTRACTION W/ INTRAOCULAR LENS  IMPLANT, BILATERAL    . CHOLECYSTECTOMY    . CORONARY ARTERY BYPASS GRAFT N/A 07/30/2013   Procedure: CORONARY ARTERY BYPASS GRAFTING (CABG) times two on pump using left internal mammary artery and left greater saphenous vein via endovein harvest.;  Surgeon: Gaye Pollack, MD;  Location: [facility] OR;  Service: Open Heart Surgery;  Laterality: N/A;  . Exploratory laparotomy, lysis of adhesions, reduce of incarcerated small bowel and colon from the chest, limited small bowel resection, incidental appendectomy, and then repair of diaphragmatic hernia with AlloDerm mesh  10/27/2009   [place]  . Exploratory  laparotomy,exploratory thoracotomy for hemorrhage  02/11/2003   [place]  . LEFT HEART CATHETERIZATION WITH CORONARY ANGIOGRAM N/A 07/30/2013   Procedure: LEFT HEART CATHETERIZATION WITH CORONARY ANGIOGRAM;  Surgeon: Peter M Martinique, MD;  Location: [facility] CATH LAB;  Service: Cardiovascular;  Laterality: N/A;  . Left subclavian Port- A-Cath insertion  03/09/2004   [place] Done  . left upper lobectomy with node dissection  02/24/2010   [place]  . TONSILLECTOMY    . Transhiatal esophagectomy with cholecystectomy, jejunostomy,  pyloroplasty and removal of right subclavian Port-A- Cath     [place]     Social History   reports that he quit smoking about 16 years ago. His smoking use included cigarettes. He has never used smokeless tobacco. He reports that he does not drink alcohol or use drugs.   Family History   His family history includes Aneurysm in his maternal grandmother and mother; Diabetes in his sister; Heart disease in his maternal uncle and paternal uncle; Kidney disease in his father.   Allergies Allergies  Allergen Reactions  . Quinolones Other (See Comments)    Patient was warned about not using Cipro and similar antibiotics. Recent studies have raised concern that fluoroquinolone antibiotics could be associated with an increased risk of aortic aneurysm Fluoroquinolones have non-antimicrobial properties that might jeopardise the integrity of the extracellular matrix of the vascular wall In a  propensity score matched cohort study in [place], there was a 66% increased rate of aortic aneurysm or dissection associated with oral fluoroquinolone use, compared wit     Home Medications  Prior to Admission medications   Medication Sig Start Date End Date Taking? Authorizing Provider  aspirin 81 MG tablet Take 1 tablet (81 mg total) by mouth daily. 12/04/13  Yes Jerline Pain, MD  atorvastatin (LIPITOR) 40 MG tablet TAKE 1 TABLET (40 MG TOTAL) BY MOUTH DAILY AT 6 PM. 12/05/18  Yes Jerline Pain, MD   carvedilol (COREG) 12.5 MG tablet TAKE 1 TABLET (12.5 MG TOTAL) BY MOUTH 2 (TWO) TIMES DAILY Patient taking differently: Take 12.5 mg by mouth 2 (two) times daily with a meal. TAKE 1 TABLET (12.5 MG TOTAL) BY MOUTH 2 (TWO) TIMES DAILY 01/31/18  Yes Jerline Pain, MD  furosemide (LASIX) 20 MG tablet Take 1 tablet (20 mg total) by mouth daily. 03/11/18 03/11/19 Yes Elwyn Reach, MD  guaiFENesin (MUCINEX) 600 MG 12 hr tablet Take 600 mg by mouth 2 (two) times daily as needed for  to loosen phlegm.    Yes [provider]  lisinopril (PRINIVIL,ZESTRIL) 5 MG tablet TAKE 1 TABLET BY MOUTH EVERY DAY Patient taking differently: Take 5 mg by mouth daily.  12/08/18  Yes Jerline Pain, MD  Tiotropium Bromide-Olodaterol 2.5-2.5 MCG/ACT AERS INHALE 2 PUFFS BY MOUTH INTO THE LUNGS DAILY Patient taking differently: Inhale 2 puffs into the lungs daily.  02/02/19  Yes Collene Gobble, MD  diphenhydramine-acetaminophen (TYLENOL PM) 25-500 MG TABS tablet Take 2 tablets by mouth at bedtime as needed (sleep).     [provider]     Critical care time: 40 min     Eliseo Gum MSN, AGACNP-BC [place] [phone] If no answer, [phone] 02/25/2019, 3:51 PM

## 2019-02-25 NOTE — Anesthesia Procedure Notes (Signed)
Arterial Line Insertion Start/End3/18/2020 7:33 AM, 02/25/2019 7:40 AM Performed by: Wilburn Cornelia, CRNA  Patient location: Pre-op. Preanesthetic checklist: patient identified, IV checked, site marked, risks and benefits discussed, surgical consent, monitors and equipment checked, pre-op evaluation, timeout performed and anesthesia consent Right, radial was placed Catheter size: 20 G Hand hygiene performed  and maximum sterile barriers used   Attempts: 1 Procedure performed without using ultrasound guided technique. Following insertion, Biopatch and dressing applied. Post procedure assessment: normal  Patient tolerated the procedure well with no immediate complications.

## 2019-02-25 NOTE — Anesthesia Postprocedure Evaluation (Signed)
Anesthesia Post Note  Patient: Glen Green  Procedure(s) Performed: ABDOMINAL AORTIC ENDOVASCULAR FENESTRATED STENT GRAFT (N/A Abdomen)     Patient location during evaluation: PACU Anesthesia Type: General Level of consciousness: responds to stimulation Pain management: pain level controlled Vital Signs Assessment: post-procedure vital signs reviewed and stable Respiratory status: patient on ventilator - see flowsheet for VS and spontaneous breathing Cardiovascular status: unstable Postop Assessment: no apparent nausea or vomiting Anesthetic complications: no    Last Vitals:  Vitals:   02/25/19 1513 02/25/19 1528  BP: (!) 86/53 92/65  Pulse: 73 66  Resp: (!) 21 (!) 27  Temp:    SpO2: 100% 100%    Last Pain:  Vitals:   02/25/19 1400  TempSrc:   PainSc: Asleep                 Phyllip Claw,W. EDMOND

## 2019-02-25 NOTE — Progress Notes (Signed)
Called by PACU nurse to be made aware that she had just put an oral airway into the patient. I arrived to find a patient who was hemodynamically stable with good O2 sats on face mask oxygen. He was not arousable, however, to voice or painful stimuli. I had the nurses check his blood sugar, which came back at 171. I then had the nurse draw an ABG. The results showed profound respiratory acidosis. We intubated him immediately without any medications and put him on the ventilator. We will reassess with another ABG in 30-45 minutes.

## 2019-02-25 NOTE — Addendum Note (Signed)
Addendum  created 02/25/19 1541 by Roderic Palau, MD   Clinical Note Signed

## 2019-02-25 NOTE — Progress Notes (Addendum)
Status post fenestrated endovascular aneurysm repair The patient has been minimally responsive and was requiring high levels of oxygen to maintain his saturation levels.  Ultimately his sats dropped and he required intubation.  His blood gas revealed a PCO2 of 120.  After intubation and time, repeat blood gas showed that his PCO2 had normalized.  He was also becoming more arousable moving all 4 extremities.  He was slightly hypotensive with systolic pressures in the 80s and 90s and so a neo-drip was initiated.  His hemoglobin was slightly below his preoperative level so I do not think that he has bleeding.  Chest x-ray reveals mild congestion and so 20 mg of IV Lasix was given.  I spoke with CCM and the plan is to transfer him to the ICU intubated.  He may be able to be extubated later today.  I updated his wife on his condition.  Glen Green

## 2019-02-25 NOTE — Anesthesia Procedure Notes (Signed)
Procedure Name: Intubation Date/Time: 02/25/2019 8:37 AM Performed by: Imagene Riches, CRNA Pre-anesthesia Checklist: Patient identified, Emergency Drugs available, Suction available and Patient being monitored Patient Re-evaluated:Patient Re-evaluated prior to induction Oxygen Delivery Method: Circle System Utilized Preoxygenation: Pre-oxygenation with 100% oxygen Induction Type: IV induction Ventilation: Mask ventilation without difficulty Laryngoscope Size: Miller and 3 Grade View: Grade I Tube type: Oral Tube size: 7.5 mm Number of attempts: 1 Airway Equipment and Method: Stylet and Oral airway Placement Confirmation: ETT inserted through vocal cords under direct vision,  positive ETCO2 and breath sounds checked- equal and bilateral Secured at: 22 cm Tube secured with: Tape Dental Injury: Teeth and Oropharynx as per pre-operative assessment

## 2019-02-25 NOTE — Transfer of Care (Signed)
Immediate Anesthesia Transfer of Care Note  Patient: Glen Green  Procedure(s) Performed: ABDOMINAL AORTIC ENDOVASCULAR FENESTRATED STENT GRAFT (N/A Abdomen)  Patient Location: PACU  Anesthesia Type:General  Level of Consciousness: drowsy  Airway & Oxygen Therapy: Patient Spontanous Breathing and Patient connected to nasal cannula oxygen  Post-op Assessment: Report given to RN and Post -op Vital signs reviewed and stable  Post vital signs: Reviewed and stable  Last Vitals:  Vitals Value Taken Time  BP    Temp    Pulse 83 02/25/2019 12:44 PM  Resp 13 02/25/2019 12:44 PM  SpO2 90 % 02/25/2019 12:44 PM  Vitals shown include unvalidated device data.  Last Pain:  Vitals:   02/25/19 0656  TempSrc:   PainSc: 0-No pain      Patients Stated Pain Goal: 2 (79/39/68 8648)  Complications: No apparent anesthesia complications

## 2019-02-25 NOTE — Progress Notes (Signed)
Patient arrived to PACU unresponsive, stable VS, with a [place] at 4L.  RN transitioned to a Face Mask shortly after arrival, still at 4L. O2 sats ranged from 94-99% for the next 45 minutes to an hour.  Brabham, MD arrived to evaluate patient during this time, patient was still unresponsive, stable VS, on a Face Mask at 4L with stable O2 sats.  Patient later began to desat to the mid to low 80's.  Patient remained unresponsive despite best efforts to gain a response from him, pupils were reactive.  RN placed an oral airway, replaced Face Mask at 10L, O2 sats remained in the low 80's and persisted to drop further.  CRNA placed an ETT tube in PACU and began bagging the patient.  Respiratory was called for a STAT Ventilator.  Ola Spurr, MD and Trula Slade, MD were notified.  STAT ABG was completed, showing patient to be severely acidotic. ISTAT also completed, showed HGB to be 9+.  STAT Xray completed, Brabham, MD noted that it appeared 'wet'.  BP's decreased to 02'M systolic once patient was intubated and on vent.  One bolus was completed prior to chest xray, after that fluids were decreased and Lasix 20 was given per MD.  Neo was started at 22mcg, then titrated to 60 shortly after. Withing 15-20 minutes of patient being on vent, he began to show signs of response, eyelid flickering and moving his arms.  BPs stabilized at 336 systolic, 122 UOP, doppler PT pulses bilaterally, both groin sites appeared soft and fairly flat, minimal bruising. Patient transferred to [place], showing more signs of response in route, RR increased, responding with weak grips, very compliant so far with verbal instruction. Report given to [facility] RN.

## 2019-02-26 LAB — CBC
HCT: 30.8 % — ABNORMAL LOW (ref 39.0–52.0)
Hemoglobin: 9.6 g/dL — ABNORMAL LOW (ref 13.0–17.0)
MCH: 31.3 pg (ref 26.0–34.0)
MCHC: 31.2 g/dL (ref 30.0–36.0)
MCV: 100.3 fL — ABNORMAL HIGH (ref 80.0–100.0)
Platelets: 110 10*3/uL — ABNORMAL LOW (ref 150–400)
RBC: 3.07 MIL/uL — ABNORMAL LOW (ref 4.22–5.81)
RDW: 15.6 % — ABNORMAL HIGH (ref 11.5–15.5)
WBC: 11.8 10*3/uL — ABNORMAL HIGH (ref 4.0–10.5)
nRBC: 0 % (ref 0.0–0.2)

## 2019-02-26 LAB — BASIC METABOLIC PANEL
Anion gap: 10 (ref 5–15)
BUN: 40 mg/dL — ABNORMAL HIGH (ref 8–23)
CO2: 20 mmol/L — ABNORMAL LOW (ref 22–32)
Calcium: 8.8 mg/dL — ABNORMAL LOW (ref 8.9–10.3)
Chloride: 108 mmol/L (ref 98–111)
Creatinine, Ser: 1.76 mg/dL — ABNORMAL HIGH (ref 0.61–1.24)
GFR calc Af Amer: 43 mL/min — ABNORMAL LOW (ref >60)
GFR calc non Af Amer: 37 mL/min — ABNORMAL LOW (ref >60)
Glucose, Bld: 146 mg/dL — ABNORMAL HIGH (ref 70–99)
Potassium: 4.6 mmol/L (ref 3.5–5.1)
Sodium: 138 mmol/L (ref 135–145)

## 2019-02-26 NOTE — Plan of Care (Signed)
  Problem: Cardiovascular: Goal: Ability to achieve and maintain adequate cardiovascular perfusion will improve Outcome: Progressing   Problem: Clinical Measurements: Goal: Ability to maintain clinical measurements within normal limits will improve Outcome: Progressing   Problem: Activity: Goal: Risk for activity intolerance will decrease Outcome: Progressing   Problem: Safety: Goal: Ability to remain free from injury will improve Outcome: Progressing

## 2019-02-26 NOTE — Evaluation (Signed)
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 Intubated for procedure, failed extubation and required reintubation, was extubated for the second time around 6pm 3/18.   Clinical Impression   PTA Pt independent in ADL and mobility. Today, Pt is eager to eat, able to perform transfers at mod I, access LB for ADL, perform standing functional tasks without LOB. Educated Pt and wife on shower safety, and they both verbalized understanding. Education for compensatory and safety strategies complete. OT to sign off at this time.     Follow Up Recommendations  No OT follow up;Supervision - Intermittent    Equipment Recommendations  None recommended by OT    Recommendations for Other Services       Precautions / Restrictions Restrictions Weight Bearing Restrictions: No      Mobility Bed Mobility               General bed mobility comments: OOB in recliner at beginning and end of session  Transfers Overall transfer level: Modified independent                    Balance Overall balance assessment: Modified Independent                                         ADL either performed or assessed with clinical judgement   ADL Overall ADL's : Modified independent                                       General ADL Comments: able to access BLE without problem for LB dressing, good standing balance no LOB and mod I for transfers     Vision Baseline Vision/History: Wears glasses Wears Glasses: Reading only Patient Visual  Report: No change from baseline Vision Assessment?: No apparent visual deficits     Perception     Praxis      Pertinent Vitals/Pain Pain Assessment: No/denies pain     Hand Dominance Right   Extremity/Trunk Assessment Upper Extremity Assessment Upper Extremity Assessment: Overall WFL for tasks assessed   Lower Extremity Assessment Lower Extremity Assessment: Defer to PT evaluation   Cervical / Trunk Assessment Cervical / Trunk Assessment: Normal   Communication Communication Communication: No difficulties   Cognition Arousal/Alertness: Awake/alert Behavior During Therapy: WFL for tasks assessed/performed Overall Cognitive Status: Within Functional Limits for tasks assessed                                     General Comments  wife present throughout session    Exercises     Shoulder Instructions      Home Living Family/patient expects to be discharged to:: Private residence Living Arrangements: Spouse/significant other;Other relatives(wife's sister) Available Help at Discharge: Family;Available 24 hours/day Type of Home: House Home Access: Stairs to enter [employer] of Steps: 4 Entrance Stairs-Rails: Right;Left Home Layout: One level  Bathroom Shower/Tub: Occupational psychologist: (comfort height)     Home Equipment: Grab bars - tub/shower;Hand held shower head          Prior Functioning/Environment Level of Independence: Independent        Comments: plays golf, walks 18 holes        OT Problem List: Decreased activity tolerance      OT Treatment/Interventions:      OT Goals(Current goals can be found in the care plan section) Acute Rehab OT Goals Patient Stated Goal: get back to playing golf OT Goal Formulation: With patient/family Time For Goal Achievement: 03/12/19 Potential to Achieve Goals: Good  OT Frequency:     Barriers to D/C:            Co-evaluation              AM-PAC OT  "6 Clicks" Daily Activity     Outcome Measure Help from another person eating meals?: None Help from another person taking care of personal grooming?: None Help from another person toileting, which includes using toliet, bedpan, or urinal?: None Help from another person bathing (including washing, rinsing, drying)?: None Help from another person to put on and taking off regular upper body clothing?: None Help from another person to put on and taking off regular lower body clothing?: None 6 Click Score: 24   End of Session Equipment Utilized During Treatment: Gait belt Nurse Communication: Mobility status  Activity Tolerance: Patient tolerated treatment well Patient left: in chair;with call bell/phone within reach;with family/visitor present  OT Visit Diagnosis: Other abnormalities of gait and mobility (R26.89)                Time: 2334-3568 OT Time Calculation (min): 19 min Charges:  OT General Charges $OT Visit: 1 Visit OT Evaluation $OT Eval Low Complexity: Glen Green Acute Rehabilitation Services Pager: [phone] Office: [place] 02/26/2019, 10:46 AM

## 2019-02-26 NOTE — Progress Notes (Signed)
Report called to receiving RN 4e22. Will transfer via [place]. PAtient with no complaints at the current time.

## 2019-02-26 NOTE — Progress Notes (Signed)
NAME:  Glen Green, MRN:  [id number], DOB:  08/06/1941, LOS: 1 ADMISSION DATE:  02/25/2019, CONSULTATION DATE:  02/25/19 REFERRING MD: Trula Slade , CHIEF COMPLAINT:  Post-operative respiratory failure   Brief History   78 year old M POD 0 FEVAR. In PACU patient exhibited respiratory failure after extubation, was not arousable and with respiratory acidosis. He was reintubated and will be placed in ICU.   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   Post-operatively, the patient was extubated in PACU. In PACU, the patient was not arousable. He was hemodynamically stable per anesthesia record, but had revere respiratory acidosis on ABG. He was reintubated. Follow up ABG shows improvement in pH and pCO2. The patient will be admitted to ICU, with the plan to extubate when more awake.   PCCM consulted for ICU vent needs.    Past Medical History   AAA s/p FEVAR  CAD Ischemic CM Emphysema Secondary pulmonary artery hypertension HLD HTN Atrial Fibrillation Squamous cell lung cancer s/p LUL lobectomy Esophageal cancer s/p transhiatal esophagogastrectomy Adrenal tumor s/p adrenalectomy Hypothyroidism  CVA   Significant Hospital Events   Extubated 02/25/2019 ready for discharge 02/26/2019 pulmonary critical care signing off 02/26/2019  Consults:  PCCM Procedures:  3/18 Intubation > 02/25/2019  Significant Diagnostic Tests:  CXR 3/18> ETT 3cm above carina. small bilateral pleural effusions. Emphysema. Interstitial opacifications   Micro Data:    Antimicrobials:  Ancef 3/18 x1  Interim history/subjective:  Reintubated in PACU s/p FEVAR Transferring to ICU for  further management   Objective   Blood pressure (!) 93/58, pulse 75, temperature 98.3 F (36.8 C), temperature source Oral, resp. rate (!) 24, height 5\' 2"  (1.575 m), weight 62.6 kg, SpO2 100 %.    Vent Mode: PSV;CPAP FiO2 (%):  [40 %-100 %] 40 % Set Rate:  [20 bmp] 20 bmp Vt Set:  [520 mL] 520 mL PEEP:  [5 cmH20] 5 cmH20 Pressure Support:  [5 cmH20] 5 cmH20 Plateau Pressure:  [16 cmH20-35 cmH20] 35 cmH20   Intake/Output Summary (Last 24 hours) at 02/26/2019 1026 Last data filed at 02/26/2019 0500 Gross per 24 hour  Intake 2224.24 ml  Output 1155 ml  Net 1069.24 ml   Filed Weights   02/25/19 0701  Weight: 62.6 kg    Examination: General: Wake alert sitting in chair in no acute distress HEENT: No JVD lymphadenopathy is appreciated Neuro: Intact CV: Sounds are regular PULM: even/non-labored, lungs bilaterally managed in bases VO:JJKK, non-tender, bsx4 active  Extremities: warm/dry, negative edema  Skin: no rashes or lesions   Resolved Hospital Problem list     Assessment & Plan:   AAA s/p FEVAR P Per vascular surgery  Acute post-operative respiratory failure with hypercarbia  -re-intubated in PACU  -Underlying emphysema -secondary pulmonary artery hypertension  P Extubated 02/25/2019 Currently on room air sitting in chair most likely will be discharged home Pulmonary critical care will sign off  Hypotension, resolved  Resolved P -MAP goal >65 -Patient off neo now  HTN Medications  HLD Resume statin  Atrial Fibrillation, rate controlled  Per primary  Constipation, at risk Monitor   Rest per primary  Best practice:  Diet: NPO  Pain/Anxiety/Delirium protocol (if  indicated): Not indicated VAP protocol (if indicated): yes  DVT prophylaxis: SCDs GI prophylaxis: protonix  Glucose control: monitor  Mobility: bedrest Code Status: Full  Family Communication: Patient and family updated at bedside Disposition: admit to ICU   Labs   CBC: Recent  Labs  Lab 02/25/19 1510 02/25/19 1520 02/25/19 1524 02/26/19 0344  WBC 7.9  --   --  11.8*  HGB 9.5* 9.9* 9.5* 9.6*  HCT 30.7* 29.0* 28.0* 30.8*  MCV 101.7*  --   --  100.3*  PLT 111*  --   --  110*    Basic Metabolic Panel: Recent Labs  Lab 02/25/19 1510 02/25/19 1520 02/25/19 1524 02/26/19 0344  NA 137 140 138 138  K 6.1* 5.7* 5.8* 4.6  CL 111  --   --  108  CO2 19*  --   --  20*  GLUCOSE 213* 200*  --  146*  BUN 31*  --   --  40*  CREATININE 1.67*  --   --  1.76*  CALCIUM 8.1*  --   --  8.8*  MG 1.8  --   --   --    GFR: Estimated Creatinine Clearance: 27.6 mL/min (A) (by C-G formula based on SCr of 1.76 mg/dL (H)). Recent Labs  Lab 02/25/19 1510 02/26/19 0344  WBC 7.9 11.8*    Liver Function Tests: No results for input(s): AST, ALT, ALKPHOS, BILITOT, PROT, ALBUMIN in the last 168 hours. No results for input(s): LIPASE, AMYLASE in the last 168 hours. No results for input(s): AMMONIA in the last 168 hours.  ABG    Component Value Date/Time   PHART 7.307 (L) 02/25/2019 1524   PCO2ART 41.1 02/25/2019 1524   PO2ART 305.0 (H) 02/25/2019 1524   HCO3 20.6 02/25/2019 1524   TCO2 22 02/25/2019 1524   ACIDBASEDEF 5.0 (H) 02/25/2019 1524   O2SAT 100.0 02/25/2019 1524     Coagulation Profile: Recent Labs  Lab 02/25/19 1510  INR 1.4*    Cardiac Enzymes: No results for input(s): CKTOTAL, CKMB, CKMBINDEX, TROPONINI in the last 168 hours.  HbA1C: Hgb A1c MFr Bld  Date/Time Value Ref Range Status  07/30/2013 05:40 PM 6.5 (H) <5.7 % Final    Comment:    (NOTE)                                                                       According to the ADA Clinical Practice Recommendations for 2011, when HbA1c is used as a screening test:  >=6.5%   Diagnostic of Diabetes Mellitus           (if abnormal result is confirmed) 5.7-6.4%   Increased risk of developing Diabetes Mellitus References:Diagnosis and Classification of Diabetes Mellitus,Diabetes  TJQZ,0092,33(AQTMA 1):S62-S69 and Standards of Medical Care in         Diabetes - 2011,Diabetes UQJF,3545,62 (Suppl 1):S11-S61.  06/08/2009 06:05 PM (H) 4.6 - 6.1 % Final   6.2 (NOTE) The ADA recommends the following therapeutic goal for glycemic control related to Hgb A1c measurement: Goal of therapy: <6.5 Hgb A1c  Reference: American Diabetes Association: Clinical Practice Recommendations 2010, Diabetes Care, 2010, 33: (Suppl  1).    CBG: Recent Labs  Lab 02/25/19 1411  GLUCAP 171*  Richardson Landry Minor ACNP Maryanna Shape PCCM Pager [phone] till 1 pm If no answer page [phone] 02/26/2019, 10:26 AM

## 2019-02-26 NOTE — Evaluation (Signed)
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 Intubated for procedure, failed extubation and required reintubation, was extubated for the second time around 6pm 3/18.    Clinical Impression  Pt admitted with above diagnosis. Pt currently with functional limitations due to the deficits listed below (see PT Problem List). PTA pt active and independent. On eval, he demonstrated modified independence with transfers. Supervision provided for ambulation 200 feet without AD. Pt on RA throughout session with SpO2 96%. Pt with low BP, 96/56 in recliner prior to ambulation and 109/53 after ambulation. Pt reports he has not eaten since sx and is currently awaiting a food tray. Pt will benefit from skilled PT to increase their independence and safety with mobility to allow discharge to the venue listed below.  PT to follow acutely to address increased ambulation and stair training. No follow up services or DME needed.     Follow Up Recommendations No PT follow up;Supervision for mobility/OOB    Equipment Recommendations  None recommended by PT    Recommendations for Other Services       Precautions / Restrictions Precautions Precautions: Other (comment) Precaution Comments: watch BP Restrictions Weight Bearing Restrictions: No      Mobility  Bed Mobility               General bed mobility comments: OOB in recliner at beginning and end of session  Transfers Overall transfer level: Modified independent Equipment used: None                Ambulation/Gait Ambulation/Gait  assistance: Supervision Gait Distance (Feet): 200 Feet Assistive device: None Gait Pattern/deviations: WFL(Within Functional Limits) Gait velocity: WFL Gait velocity interpretation: >2.62 ft/sec, indicative of community ambulatory General Gait Details: steady gait  Stairs            Wheelchair Mobility    Modified Rankin (Stroke Patients Only)       Balance Overall balance assessment: Modified Independent                                           Pertinent Vitals/Pain Pain Assessment: No/denies pain    Home Living Family/patient expects to be discharged to:: Private residence Living Arrangements: Spouse/significant other;Other relatives(wife's sister) Available Help at Discharge: Family;Available 24 hours/day Type of Home: House Home Access: Stairs to enter Entrance Stairs-Rails: Psychiatric nurse of Steps: 4 Home Layout: One level Home Equipment: Grab bars - tub/shower;Hand held shower head      Prior Function Level of Independence: Independent         Comments: plays golf, walks 18 holes     Hand Dominance   Dominant Hand: Right    Extremity/Trunk Assessment   Upper Extremity Assessment Upper Extremity Assessment: Overall WFL for tasks assessed    Lower Extremity Assessment Lower Extremity Assessment: Overall WFL for tasks assessed    Cervical / Trunk Assessment Cervical / Trunk Assessment: Normal  Communication   Communication: No difficulties  Cognition Arousal/Alertness: Awake/alert Behavior During Therapy: WFL for tasks assessed/performed Overall Cognitive Status: Within Functional  Limits for tasks assessed                                        General Comments General comments (skin integrity, edema, etc.): wife present during session    Exercises     Assessment/Plan    PT Assessment Patient needs continued PT services  PT Problem List Decreased mobility;Decreased activity  tolerance       PT Treatment Interventions Functional mobility training;Patient/family education;Gait training;Therapeutic activities;Stair training    PT Goals (Current goals can be found in the Care Plan section)  Acute Rehab PT Goals Patient Stated Goal: get back to playing golf PT Goal Formulation: With patient/family Time For Goal Achievement: 03/12/19 Potential to Achieve Goals: Good    Frequency Min 3X/week   Barriers to discharge        Co-evaluation               AM-PAC PT "6 Clicks" Mobility  Outcome Measure Help needed turning from your back to your side while in a flat bed without using bedrails?: None Help needed moving from lying on your back to sitting on the side of a flat bed without using bedrails?: None Help needed moving to and from a bed to a chair (including a wheelchair)?: None Help needed standing up from a chair using your arms (e.g., wheelchair or bedside chair)?: None Help needed to walk in hospital room?: A Little Help needed climbing 3-5 steps with a railing? : A Little 6 Click Score: 22    End of Session Equipment Utilized During Treatment: Gait belt Activity Tolerance: Patient tolerated treatment well Patient left: in chair;with call bell/phone within reach;with family/visitor present Nurse Communication: Mobility status PT Visit Diagnosis: Difficulty in walking, not elsewhere classified (R26.2)    Time: 1594-7076 PT Time Calculation (min) (ACUTE ONLY): 19 min   Charges:   PT Evaluation $PT Eval Low Complexity: 1 Low          Lorrin Goodell, PT  Office # [phone] Pager [phone]   Lorriane Shire 02/26/2019, 10:57 AM

## 2019-02-26 NOTE — Evaluation (Signed)
Clinical/Bedside Swallow Evaluation Patient Details  Name: Glen Green MRN: [id number] Date of Birth: 1941/08/20  Today's Date: 02/26/2019 Time: SLP Start Time (ACUTE ONLY): 0902 SLP Stop Time (ACUTE ONLY): 0916 SLP Time Calculation (min) (ACUTE ONLY): 14 min  Past Medical History:  Past Medical History:  Diagnosis Date  . AAA (abdominal aortic aneurysm) ([place]) 08/22/2012   3.9 cm by CT  - June 2013, stable   . 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 Atrial fibrillation ([place])   . CAD (coronary artery disease)   . Cancer ([place])    Esophageal, adrenal gland, skin; lung  . Carotid artery occlusion   . Cerebral aneurysm    [place]. repair  1994  . Dyslipidemia   . History of esophageal cancer    s/p transhiatal esophagogastrectomy  . History of lung cancer   . Hypothyroidism   . Pneumonia   . Postoperative atrial fibrillation ([place]) 12/04/2013   Short course of amiodarone, resolved. Postop bypass  . SBO (small bowel obstruction) ([place]) 08/15/2012   History of small bowel obstruction (status post small bowel       resection, lysis of adhesions, incidental appendectomy, and repair       of left diaphragmatic hernia  . Stroke [facility]) 1995   denies residual   Past Surgical History:  Past Surgical History:  Procedure Laterality Date  . BRAIN SURGERY     brain aneursyn  . CATARACT EXTRACTION W/ INTRAOCULAR LENS  IMPLANT, BILATERAL    . CHOLECYSTECTOMY    . CORONARY ARTERY BYPASS GRAFT N/A 07/30/2013   Procedure: CORONARY ARTERY BYPASS GRAFTING (CABG) times two on pump using left internal mammary artery and left greater saphenous vein via endovein harvest.;  Surgeon: Gaye Pollack, MD;  Location: [facility] OR;  Service: Open Heart Surgery;  Laterality: N/A;  . Exploratory laparotomy, lysis of adhesions, reduce of incarcerated small bowel and colon from the chest, limited small bowel resection, incidental appendectomy, and then repair of diaphragmatic hernia with AlloDerm mesh   10/27/2009   [place]  . Exploratory laparotomy,exploratory thoracotomy for hemorrhage  02/11/2003   [place]  . LEFT HEART CATHETERIZATION WITH CORONARY ANGIOGRAM N/A 07/30/2013   Procedure: LEFT HEART CATHETERIZATION WITH CORONARY ANGIOGRAM;  Surgeon: Peter M Martinique, MD;  Location: [facility] CATH LAB;  Service: Cardiovascular;  Laterality: N/A;  . Left subclavian Port- A-Cath insertion  03/09/2004   [place]  . left upper lobectomy with node dissection  02/24/2010   [place]  . TONSILLECTOMY    . 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 Intubated for procedure, failed extubation and required reintubation, was extubated for the second time around 6pm 3/18.  Pt seen by SLP during prior admission for bedside assessment, 08/01/13 and briefly consumed modified diet (dys 3/nectar) before advancing to regular/thin liquids. Esophageal deficits were questioned.   Assessment / Plan / Recommendation Clinical Impression  Patient presents with oropharyngeal swallow which appears at bedside to be within functional limits with adequate airway protection. No overt signs of aspiration observed despite challenging with consecutive straw sips of thin liquids in excess of 3oz. Mastication functional with good oral clearance of  solid. Pt reports remote history of dysphagia related to esophageal cancer. He has been consuming a regular diet, thin liquids prior to admission without reported difficulties. Recommend regular diet with thin liquids, meds whole with liquid. No further skilled ST needs identified. Will s/o.   SLP Visit Diagnosis:  Dysphagia, unspecified (R13.10)    Aspiration Risk  Mild aspiration risk    Diet Recommendation Regular;Thin liquid   Liquid Administration via: Cup;Straw Medication Administration: Whole meds with liquid Supervision: Patient able to self feed    Other  Recommendations Oral Care Recommendations: Oral care BID   Follow up Recommendations None      Frequency and Duration            Prognosis Prognosis for Safe Diet Advancement: Good      Swallow Study   General Date of Onset: 02/25/19 HPI: 78 year old M with PMH AAA, CAD with CABG, ischemic CM, HTN,  CABG, Afib, HLD, Hypothyroidism, PNA, CVA, squamous cell lung cancer s/p LUL lobectomy, esophageal cancer s/p transhiatal esophagogastrectomy, adrenal cancer s/p adrenalectomy, pulmonary artery HTN, emphysema, who presented 3/18 for FEVAR. Intubated for procedure, failed extubation and required reintubation, was extubated for the second time around 6pm 3/18.  Pt seen by SLP during prior admission for bedside assessment, 08/01/13 and briefly consumed modified diet (dys 3/nectar) before advancing to regular/thin liquids. Esophageal deficits were questioned. Type of Study: Bedside Swallow Evaluation Previous Swallow Assessment: see HPI Diet Prior to this Study: Regular;Thin liquids Temperature Spikes Noted: No Respiratory Status: Room air History of Recent Intubation: Yes Length of Intubations (days): 1 days(2 intubations) Date extubated: 02/25/19 Behavior/Cognition: Alert;Cooperative;Pleasant mood Oral Cavity Assessment: Within Functional Limits Oral Care Completed by SLP: No Oral Cavity - Dentition: Dentures, top;Dentures, bottom Vision: Functional for self-feeding Self-Feeding Abilities: Able to feed self Patient Positioning: Upright in chair Baseline Vocal Quality: Normal Volitional Cough: Strong Volitional Swallow: Able to elicit    Oral/Motor/Sensory Function Overall Oral Motor/Sensory Function: Within functional limits    Ice Chips Ice chips: Within functional limits Presentation: Spoon   Thin Liquid Thin Liquid: Within functional limits Presentation: Cup;Self Fed;Straw    Nectar Thick Nectar Thick Liquid: Not tested   Honey Thick Honey Thick Liquid: Not tested   Puree Puree: Within functional limits Presentation: Self Fed;Spoon   Solid     Solid: Within functional limits Presentation: [place], [place], [place] Speech-Language Pathologist Acute Rehabilitation Services Pager: [phone] Office: [phone]  Aliene Altes 02/26/2019,9:22 AM

## 2019-02-26 NOTE — Progress Notes (Signed)
Patient drank ice water. No signs of distress or aspiration.

## 2019-02-26 NOTE — Progress Notes (Signed)
Patient consumed ice chips. No sign of aspiration or distress.

## 2019-02-26 NOTE — Progress Notes (Signed)
Foley catheter removed at 0400 hours. RN explained the importance of voiding in the next four to six hours. Patient denies any concerns at this time.

## 2019-02-26 NOTE — Progress Notes (Addendum)
[facility]  Subjective  - No complaints this am   Objective (!) 94/52 75 98.3 F (36.8 C) (Oral) (!) 28 100%  Intake/Output Summary (Last 24 hours) at 02/26/2019 0839 Last data filed at 02/26/2019 0500 Gross per 24 hour  Intake 3024.24 ml  Output 1380 ml  Net 1644.24 ml    Moving all 4 ext with intact sensation SAT 100% well currently on 1 L [place], non labored breathing  Heart RRR Groin soft without hematoma Abdomin soft  Assessment/Planning: POD # FEVAR  Post operative respiratory acidosis re intubated.  Extubated 02/25/2019 1822  Will ween off O 2, pending tolerating PO's waiting on breakfast to come.  Plan to ambulate after breakfast. UO 1,200 last 24 hours Cr 1.76 baseline 1.4 fluids D/C secondary to pulmonary edema and lasix given and ordered for 20 mg daily. We will observe for additional 24 hours at this point and transfer him to 4E.   Diet heart healthy ordered.   Roxy Horseman 02/26/2019 8:39 AM --  Laboratory Lab Results: Recent Labs    02/25/19 1510  02/25/19 1524 02/26/19 0344  WBC 7.9  --   --  11.8*  HGB 9.5*   < > 9.5* 9.6*  HCT 30.7*   < > 28.0* 30.8*  PLT 111*  --   --  110*   < > = values in this interval not displayed.   BMET Recent Labs    02/25/19 1510 02/25/19 1520 02/25/19 1524 02/26/19 0344  NA 137 140 138 138  K 6.1* 5.7* 5.8* 4.6  CL 111  --   --  108  CO2 19*  --   --  20*  GLUCOSE 213* 200*  --  146*  BUN 31*  --   --  40*  CREATININE 1.67*  --   --  1.76*  CALCIUM 8.1*  --   --  8.8*    COAG Lab Results  Component Value Date   INR 1.4 (H) 02/25/2019   INR 1.1 02/18/2019   INR 1.52 (H) 07/31/2013   No results found for: PTT   I agree with the above.  Have seen and evaluated the patient.  He is postoperative day 1 status post fenestrated endovascular aneurysm repair.  He was reintubated in the PACU for hypercarbia which resolved leading to extubation last night.  This morning  he has voided a little bit after his Foley catheter has been removed.  He does not have any pain.  He has Doppler signals in his feet and the groin incisions are soft.  The patient has acute renal insufficiency following his repair with a slight increase in his creatinine.  I am a little reluctant to hydrate him because he did develop some pulmonary edema requiring Lasix in the PACU.  I will stop his ACE inhibitor and repeat his creatinine in the morning.  He is safe for transfer to 4 E.  If he is doing well tomorrow he can be discharged.  He will be on Plavix at discharge.  Annamarie Major

## 2019-02-27 ENCOUNTER — Encounter (HOSPITAL_COMMUNITY): Payer: Self-pay | Admitting: Surgery

## 2019-02-27 LAB — BASIC METABOLIC PANEL
Anion gap: 7 (ref 5–15)
BUN: 40 mg/dL — ABNORMAL HIGH (ref 8–23)
CHLORIDE: 110 mmol/L (ref 98–111)
CO2: 21 mmol/L — ABNORMAL LOW (ref 22–32)
Calcium: 8.7 mg/dL — ABNORMAL LOW (ref 8.9–10.3)
Creatinine, Ser: 1.57 mg/dL — ABNORMAL HIGH (ref 0.61–1.24)
GFR calc Af Amer: 49 mL/min — ABNORMAL LOW (ref >60)
GFR calc non Af Amer: 42 mL/min — ABNORMAL LOW (ref >60)
Glucose, Bld: 107 mg/dL — ABNORMAL HIGH (ref 70–99)
Potassium: 4.3 mmol/L (ref 3.5–5.1)
Sodium: 138 mmol/L (ref 135–145)

## 2019-02-27 LAB — CBC
HCT: 30.4 % — ABNORMAL LOW (ref 39.0–52.0)
Hemoglobin: 9.8 g/dL — ABNORMAL LOW (ref 13.0–17.0)
MCH: 31.6 pg (ref 26.0–34.0)
MCHC: 32.2 g/dL (ref 30.0–36.0)
MCV: 98.1 fL (ref 80.0–100.0)
Platelets: 112 10*3/uL — ABNORMAL LOW (ref 150–400)
RBC: 3.1 MIL/uL — ABNORMAL LOW (ref 4.22–5.81)
RDW: 15.6 % — ABNORMAL HIGH (ref 11.5–15.5)
WBC: 10.9 10*3/uL — ABNORMAL HIGH (ref 4.0–10.5)
nRBC: 0 % (ref 0.0–0.2)

## 2019-02-27 MED ORDER — CLOPIDOGREL BISULFATE 75 MG PO TABS: 75.0000 mg | ORAL_TABLET | Freq: Every day | ORAL | 2 refills | Status: DC

## 2019-02-27 MED ORDER — OXYCODONE-ACETAMINOPHEN 5-325 MG PO TABS: 1.0000 | ORAL_TABLET | Freq: Four times a day (QID) | ORAL | 0 refills | Status: DC | PRN

## 2019-02-27 NOTE — Progress Notes (Signed)
02/27/2019 10:09 AM Discharge AVS meds taken today and those due this evening reviewed.  Follow-up appointments and when to call md reviewed.  D/C IV and TELE.  Questions and concerns addressed.   D/C home per orders. [place]

## 2019-02-27 NOTE — Discharge Instructions (Signed)
   [facility]   Discharge Instructions  Endovascular Aortic Aneurysm Repair  Please refer to the following instructions for your post-procedure care. Your surgeon or Physician Assistant will discuss any changes with you.  Activity  You are encouraged to walk as much as you can. You can slowly return to normal activities but must avoid strenuous activity and heavy lifting until your doctor tells you it's OK. Avoid activities such as vacuuming or swinging a gold club. It is normal to feel tired for several weeks after your surgery. Do not drive until your doctor gives the OK and you are no longer taking prescription pain medications. It is also normal to have difficulty with sleep habits, eating, and bowel movements after surgery. These will go away with time.  Bathing/Showering  You may shower after you go home. If you have an incision, do not soak in a bathtub, hot tub, or swim until the incision heals completely.  Incision Care  Shower every day. Clean your incision with mild soap and water. Pat the area dry with a clean towel. You do not need a bandage unless otherwise instructed. Do not apply any ointments or creams to your incision. If you clothing is irritating, you may cover your incision with a dry gauze pad.  Diet  Resume your normal diet. There are no special food restrictions following this procedure. A low fat/low cholesterol diet is recommended for all patients with vascular disease. In order to heal from your surgery, it is CRITICAL to get adequate nutrition. Your body requires vitamins, minerals, and protein. Vegetables are the best source of vitamins and minerals. Vegetables also provide the perfect balance of protein. Processed food has little nutritional value, so try to avoid this.  Medications  Resume taking all of your medications unless your doctor or nurse practitioner tells you not to. If your incision is causing pain, you may take  over-the-counter pain relievers such as acetaminophen (Tylenol). If you were prescribed a stronger pain medication, please be aware these medications can cause nausea and constipation. Prevent nausea by taking the medication with a snack or meal. Avoid constipation by drinking plenty of fluids and eating foods with a high amount of fiber, such as fruits, vegetables, and grains. Do not take Tylenol if you are taking prescription pain medications.   Follow up  Our office will schedule a follow-up appointment with a C.T. scan 3-4 weeks after your surgery.  Please call us immediately for any of the following conditions  Severe or worsening pain in your legs or feet or in your abdomen back or chest. Increased pain, redness, drainage (pus) from your incision sit. Increased abdominal pain, bloating, nausea, vomiting or persistent diarrhea. Fever of 101 degrees or higher. Swelling in your leg (s),  Reduce your risk of vascular disease  Stop smoking. If you would like help call [facility] at 1-800-QUIT-NOW (1-800-784-8669) or [place] at [phone]. Manage your cholesterol Maintain a desired weight Control your diabetes Keep your blood pressure down  If you have questions, please call the office at [phone].   

## 2019-02-27 NOTE — Care Management Important Message (Signed)
Important Message  Patient Details  Name: Glen Green MRN: [id number] Date of Birth: November 23, 1941   Medicare Important Message Given:  Yes    Orbie Pyo 02/27/2019, 3:35 PM

## 2019-02-27 NOTE — Progress Notes (Addendum)
  Progress Note    02/27/2019 7:55 AM 2 Days Post-Op  Subjective:  Patient anticipating discharge home this morning   Vitals:   02/26/19 1943 02/27/19 0357  BP:  97/63  Pulse: 83 82  Resp:  19  Temp:  98.1 F (36.7 C)  SpO2: 92% 96%   Physical Exam: Lungs:  Non labored Incisions:  B groin incisions soft without hematoma or bleeding Extremities:  Feet well perfused and warm to touch Abdomen:  Soft, NT, ND Neurologic: A&OI  CBC    Component Value Date/Time   WBC 10.9 (H) 02/27/2019 0414   RBC 3.10 (L) 02/27/2019 0414   HGB 9.8 (L) 02/27/2019 0414   HGB 14.5 05/10/2008 0822   HCT 30.4 (L) 02/27/2019 0414   HCT 42.8 05/10/2008 0822   PLT 112 (L) 02/27/2019 0414   PLT 241 05/10/2008 0822   MCV 98.1 02/27/2019 0414   MCV 94.4 05/10/2008 0822   MCH 31.6 02/27/2019 0414   MCHC 32.2 02/27/2019 0414   RDW 15.6 (H) 02/27/2019 0414   RDW 14.1 05/10/2008 0822   LYMPHSABS 0.8 03/08/2018 1743   LYMPHSABS 1.5 05/10/2008 0822   MONOABS 0.8 03/08/2018 1743   MONOABS 0.6 05/10/2008 0822   EOSABS 0.2 03/08/2018 1743   EOSABS 0.2 05/10/2008 0822   BASOSABS 0.0 03/08/2018 1743   BASOSABS 0.0 05/10/2008 0822    BMET    Component Value Date/Time   NA 138 02/27/2019 0414   K 4.3 02/27/2019 0414   CL 110 02/27/2019 0414   CO2 21 (L) 02/27/2019 0414   GLUCOSE 107 (H) 02/27/2019 0414   BUN 40 (H) 02/27/2019 0414   CREATININE 1.57 (H) 02/27/2019 0414   CREATININE 0.99 06/22/2013 0912   CALCIUM 8.7 (L) 02/27/2019 0414   GFRNONAA 42 (L) 02/27/2019 0414   GFRAA 49 (L) 02/27/2019 0414    INR    Component Value Date/Time   INR 1.4 (H) 02/25/2019 1510     Intake/Output Summary (Last 24 hours) at 02/27/2019 0755 Last data filed at 02/26/2019 1943 Gross per 24 hour  Intake 100 ml  Output 200 ml  Net -100 ml     Assessment/Plan:  78 y.o. male is s/p FEVAR with post op resp acidosis requiring reintubation now resolved 2 Days Post-Op   Perfusing BLE well B groin  incisions soft without hematoma or bleeding AKI: creatinine improved, UOP wnl Ambulating without difficulty D/c home this morning; CTA abd/pelvis in 4 weeks   Dagoberto Ligas, PA-C [facility] [phone] 02/27/2019 7:55 AM   I agree with the above.  Have seen evaluate the patient.  He is cleared for discharge.  His renal insufficiency has nearly resolved and back to baseline.  I told him I would hold his ACE inhibitor for 3-4 more days and continue with oral hydration.  He will follow-up in 4 weeks with a CT angiogram of his abdomen and pelvis.  Annamarie Major

## 2019-02-28 MED ORDER — ONDANSETRON 4 MG PO TBDP: 4.0000 mg | ORAL_TABLET | Freq: Three times a day (TID) | ORAL | 0 refills | Status: DC | PRN

## 2019-02-28 NOTE — Addendum Note (Signed)
Addended by: Serafina Mitchell on: 02/28/2019 05:43 PM   Modules accepted: Orders

## 2019-03-01 MED ORDER — PROMETHAZINE HCL 6.25 MG/5ML PO SYRP: 12.5000 mg | ORAL_SOLUTION | Freq: Four times a day (QID) | ORAL | 1 refills | Status: DC | PRN

## 2019-03-01 NOTE — Addendum Note (Signed)
Addended by: Serafina Mitchell on: 03/01/2019 08:39 AM   Modules accepted: Orders

## 2019-03-03 NOTE — Discharge Summary (Signed)
[facility] Discharge Summary   Glen Green February 28, 1941 78 y.o. male  MRN: [id number]  Admission Date: 02/25/2019  Discharge Date: 02/27/19  Physician: Dr. 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  He however developed respiratory acidosis requiring re-intubation.  Blood gas improved after an hour however he was transferred to the PACU.  CCM was consulted for vent management.  He was extubated that evening.  POD#1 he was oxygenating well with [place] and was transferred to 4E.  He did experience some renal insufficiency likely related to a brief period of post op hypotension.  By the time of discharge renal function returned to normal.  POD#2 BLE were well perfused and he was ambulating without difficulty.  He will follow up in office in 4 weeks with CTA abd/pelvis.  Discharge instructions were reviewed with the patient and he voiced his understanding.  He will be discharged to home in stable condition.  CBC    Component Value Date/Time   WBC 10.9 (H) 02/27/2019 0414   RBC 3.10 (L) 02/27/2019 0414   HGB 9.8 (L) 02/27/2019 0414   HGB 14.5 05/10/2008 0822   HCT 30.4 (L) 02/27/2019 0414   HCT 42.8 05/10/2008 0822   PLT 112 (L) 02/27/2019 0414   PLT 241 05/10/2008 0822   MCV 98.1 02/27/2019 0414   MCV 94.4 05/10/2008 0822   MCH 31.6 02/27/2019 0414   MCHC 32.2 02/27/2019 0414   RDW 15.6 (H) 02/27/2019 0414   RDW 14.1 05/10/2008 0822   LYMPHSABS 0.8 03/08/2018 1743   LYMPHSABS 1.5 05/10/2008 0822   MONOABS 0.8 03/08/2018 1743   MONOABS 0.6 05/10/2008 0822   EOSABS 0.2 03/08/2018 1743   EOSABS 0.2 05/10/2008 0822   BASOSABS 0.0 03/08/2018 1743   BASOSABS 0.0 05/10/2008 0822    BMET    Component Value Date/Time   NA 138 02/27/2019 0414   K 4.3 02/27/2019 0414   CL 110 02/27/2019 0414   CO2 21 (L) 02/27/2019 0414   GLUCOSE 107 (H) 02/27/2019 0414   BUN 40 (H) 02/27/2019 0414   CREATININE 1.57 (H) 02/27/2019 0414   CREATININE 0.99 06/22/2013 0912   CALCIUM 8.7 (L) 02/27/2019 0414   GFRNONAA 42 (L) 02/27/2019 0414   GFRAA 49 (L) 02/27/2019  1962         Discharge Diagnosis:  S/P AAA repair [I29.798, Z86.79]  Secondary Diagnosis: Patient Active Problem List   Diagnosis Date Noted   Status post aortic coarctation stent placement 02/25/2019   S/P AAA repair 02/25/2019   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[place]) 04/10/2018   Pleural effusion 04/02/2018   Pulmonary emphysema ([place])    Acute respiratory failure with hypoxia ([place]) 03/08/2018   Acute on chronic clinical systolic heart failure ([place]) 09/13/2014   Aneurysm of abdominal vessel ([place]) 08/23/2014   Cardiomyopathy, ischemic 06/16/2014   Essential hypertension, benign 06/16/2014   Pure hypercholesterolemia 06/16/2014   Postoperative atrial fibrillation ([place]) 12/04/2013   Coronary atherosclerosis of native coronary artery 09/09/2013   NSTEMI (non-ST elevated myocardial infarction) ([place]) 07/30/2013   AAA (abdominal aortic aneurysm) ([place]) 08/22/2012   Bilateral hearing loss 08/22/2012   PVD (peripheral vascular disease) ([place]) 08/22/2012   Preventative health care 08/15/2012   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     SBO (small bowel obstruction) ([place]) 08/15/2012   Hypokalemia 12/09/2011   Weakness generalized 12/05/2011   Protein-calorie malnutrition, moderate ([place]) 12/05/2011   Hyponatremia 12/05/2011   Dyslipidemia    History of esophageal cancer    Hypothyroidism    Cerebral aneurysm    History of lung cancer    Past Medical History:  Diagnosis Date   AAA (abdominal aortic aneurysm) ([place]) 08/22/2012   3.9 cm by CT  - June 2013, stable    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 repair  1994   Dyslipidemia    History of esophageal cancer    s/p transhiatal esophagogastrectomy   History of lung cancer    Hypothyroidism    Pneumonia    Postoperative atrial fibrillation ([place]) 12/04/2013   Short course of amiodarone, resolved. Postop bypass   SBO (small bowel obstruction) ([place]) 08/15/2012   History of small bowel obstruction (status post small bowel       resection, lysis of adhesions, incidental appendectomy, and repair       of left diaphragmatic hernia   Stroke ([place]) 1995   denies residual     Allergies as of 02/27/2019      Reactions   Quinolones Other (See Comments)   Patient was warned about not using Cipro and similar antibiotics. Recent studies have raised concern that fluoroquinolone antibiotics could be associated with an increased risk of aortic aneurysm Fluoroquinolones have non-antimicrobial properties that might jeopardise the integrity of the extracellular matrix of the vascular wall In a  propensity score matched cohort study in [place], there was a 66% increased rate of aortic aneurysm or dissection associated with oral fluoroquinolone use, compared wit      Medication List    TAKE these medications   aspirin EC 81 MG tablet Take 1 tablet (81 mg total) by  mouth daily.   atorvastatin 40 MG tablet Commonly known as:  LIPITOR TAKE 1 TABLET (40 MG TOTAL) BY MOUTH DAILY AT 6 PM.   carvedilol 12.5 MG tablet Commonly known as:  COREG TAKE 1 TABLET (12.5 MG TOTAL) BY MOUTH 2 (TWO) TIMES DAILY What changed:    how much to take  how to take this  when to take this   clopidogrel 75 MG tablet Commonly known  as:  PLAVIX Take 1 tablet (75 mg total) by mouth daily.   diphenhydramine-acetaminophen 25-500 MG Tabs tablet Commonly known as:  TYLENOL PM Take 2 tablets by mouth at bedtime as needed (sleep).   furosemide 20 MG tablet Commonly known as:  Lasix Take 1 tablet (20 mg total) by mouth daily.   guaiFENesin 600 MG 12 hr tablet Commonly known as:  MUCINEX Take 600 mg by mouth 2 (two) times daily as needed for to loosen phlegm.   lisinopril 5 MG tablet Commonly known as:  PRINIVIL,ZESTRIL TAKE 1 TABLET BY MOUTH EVERY DAY   oxyCODONE-acetaminophen 5-325 MG tablet Commonly known as:  PERCOCET/ROXICET Take 1 tablet by mouth every 6 (six) hours as needed for moderate pain.   Tiotropium Bromide-Olodaterol 2.5-2.5 MCG/ACT Aers INHALE 2 PUFFS BY MOUTH INTO THE LUNGS DAILY What changed:  See the new instructions.       Discharge Instructions:   [facility]  Discharge Instructions Endovascular Aortic Aneurysm Repair  Please refer to the following instructions for your post-procedure care. Your surgeon or Physician Assistant will discuss any changes with you.  Activity  You are encouraged to walk as much as you can. You can slowly return to normal activities but must avoid strenuous activity and heavy lifting until your doctor tells you it's OK. Avoid activities such as vacuuming or swinging a gold club. It is normal to feel tired for several weeks after your surgery. Do not drive until your doctor gives the OK and you are no longer taking prescription pain medications. It is also normal to have  difficulty with sleep habits, eating, and bowel movements after surgery. These will go away with time.  Bathing/Showering  You may shower after you go home. If you have an incision, do not soak in a bathtub, hot tub, or swim until the incision heals completely.  Incision Care  Shower every day. Clean your incision with mild soap and water. Pat the area dry with a clean towel. You do not need a bandage unless otherwise instructed. Do not apply any ointments or creams to your incision. If you clothing is irritating, you may cover your incision with a dry gauze pad.  Diet  Resume your normal diet. There are no special food restrictions following this procedure. A low fat/low cholesterol diet is recommended for all patients with vascular disease. In order to heal from your surgery, it is CRITICAL to get adequate nutrition. Your body requires vitamins, minerals, and protein. Vegetables are the best source of vitamins and minerals. Vegetables also provide the perfect balance of protein. Processed food has little nutritional value, so try to avoid this.  Medications  Resume taking all of your medications unless your doctor or Physician Assistnat tells you not to. If your incision is causing pain, you may take over-the-counter pain relievers such as acetaminophen (Tylenol). If you were prescribed a stronger pain medication, please be aware these medications can cause nausea and constipation. Prevent nausea by taking the medication with a snack or meal. Avoid constipation by drinking plenty of fluids and eating foods with a high amount of fiber, such as fruits, vegetables, and grains. Do not take Tylenol if you are taking prescription pain medications.   Follow up  [place] office will schedule a follow-up appointment with a C.T. scan 3-4 weeks after your surgery.  Please call us immediately for any of the following conditions  Severe or worsening pain in your legs or feet or in your abdomen  back or  chest. Increased pain, redness, drainage (pus) from your incision sit. Increased abdominal pain, bloating, nausea, vomiting or persistent diarrhea. Fever of 101 degrees or higher. Swelling in your leg (s),  Reduce your risk of vascular disease  Stop smoking. If you would like help call [facility] at 1-800-QUIT-NOW 515-401-0021) or [place] at [phone]. Manage your cholesterol Maintain a desired weight Control your diabetes Keep your blood pressure down  If you have questions, please call the office at [phone].   Disposition: home  Patient's condition: is Good  Follow up: 1. Dr. Trula Slade in 4 weeks with CTA protocol   Dagoberto Ligas, PA-C [facility] [phone] 03/03/2019  10:57 AM   - For VQI Registry use - Post-op:  Time to Extubation: [ ]  In OR, [ x] < 12 hrs, [ ]  12-24 hrs, [ ]  >=24 hrs Vasopressors Req. Post-op: Yes MI: No., [ ]  Troponin only, [ ]  EKG or Clinical New Arrhythmia: No CHF: No ICU Stay: 1 day in ICU Transfusion: No   Complications: Resp failure: Yes.  , [ ]  Pneumonia, [ ]  Ventilator Chg in renal function: Yes.  , [x ] Inc. Cr > 0.5, [ ]  Temp. Dialysis,  [ ]  Permanent dialysis Leg ischemia: No., no Surgery needed, [ ]  Yes, Surgery needed,  [ ]  Amputation Bowel ischemia: No., [ ]  Medical Rx, [ ]  Surgical Rx Wound complication: No., [ ]  Superficial separation/infection, [ ]  Return to OR Return to OR: No  Return to OR for bleeding: No Stroke: No., [ ]  Minor, [ ]  Major  Discharge medications: Statin use:  Yes  ASA use:  Yes  Plavix use:  Yes  Beta blocker use:  Yes  ARB use:  No ACEI use:  Yes CCB use:  No

## 2019-03-08 ENCOUNTER — Other Ambulatory Visit: Payer: Self-pay | Admitting: Cardiology

## 2019-03-11 ENCOUNTER — Other Ambulatory Visit: Payer: Self-pay

## 2019-03-11 ENCOUNTER — Other Ambulatory Visit: Payer: Self-pay | Admitting: Cardiology

## 2019-03-11 DIAGNOSIS — I714 Abdominal aortic aneurysm, without rupture, unspecified: Secondary | ICD-10-CM

## 2019-03-11 MED ORDER — FUROSEMIDE 20 MG PO TABS: 20.0000 mg | ORAL_TABLET | Freq: Every day | ORAL | 1 refills | Status: DC

## 2019-03-11 NOTE — Telephone Encounter (Signed)
Pt's wife calling requesting a refill on furosemide. This medication was prescribed in the hospital. Would Dr. Marlou Porch like to refill this medication? Please address

## 2019-03-12 ENCOUNTER — Other Ambulatory Visit: Payer: Self-pay

## 2019-03-12 DIAGNOSIS — I714 Abdominal aortic aneurysm, without rupture, unspecified: Secondary | ICD-10-CM

## 2019-03-21 ENCOUNTER — Other Ambulatory Visit: Payer: Self-pay | Admitting: Emergency Medicine

## 2019-03-24 ENCOUNTER — Telehealth: Payer: Self-pay | Admitting: *Deleted

## 2019-03-24 NOTE — Telephone Encounter (Signed)
Glen Green is s/p FEVAR 02-25-2019 by Dr. Harold Barban.  Returning Glen Green's voice message regarding post op activity level, restrictions, and follow up appointment with Dr. Trula Slade.   Instructed Glen Green that her husband should avoid strenuous activity and heavy lifting.  Glen Green may drive as long as he is not taking pain medication.  Glen Green post -op follow up is scheduled with Dr. Trula Slade on 04-20-2019 at 14:15 AM. Glen Green verbalized understanding of instructions.

## 2019-04-06 ENCOUNTER — Ambulatory Visit: Payer: Medicare Other | Admitting: Surgery

## 2019-04-15 ENCOUNTER — Ambulatory Visit
Admission: RE | Admit: 2019-04-15 | Discharge: 2019-04-15 | Disposition: A | Discharge status: Home / Self Care | Payer: Medicare Other | Source: Ambulatory Visit | Attending: Surgery | Admitting: Surgery

## 2019-04-15 ENCOUNTER — Other Ambulatory Visit: Payer: Self-pay

## 2019-04-15 DIAGNOSIS — I714 Abdominal aortic aneurysm, without rupture, unspecified: Secondary | ICD-10-CM

## 2019-04-15 MED ORDER — IOPAMIDOL (ISOVUE-370) INJECTION 76%
75.0000 mL | Freq: Once | INTRAVENOUS | Status: AC | PRN
  Administered 2019-04-15: 14:00:00 75 mL via INTRAVENOUS

## 2019-04-17 ENCOUNTER — Telehealth (HOSPITAL_COMMUNITY): Payer: Self-pay

## 2019-04-17 NOTE — Telephone Encounter (Signed)
The above patient or their representative was contacted and gave the following answers to these questions:         Do you have any of the following symptoms?  Fever                    Cough                   Shortness of breath              NO x  Do  you have any of the following other symptoms?    muscle pain         vomiting,        diarrhea        rash         weakness        red eye        abdominal pain         bruising          bruising or bleeding              joint pain           severe headache  NO  Have you been in contact with someone who was or has been sick in the past 2 weeks?  Yes                 Unsure                         Unable to assess           NO  Does the person that you were in contact with have any of the following symptoms?   Cough         shortness of breath           muscle pain         vomiting,            diarrhea            rash            weakness           fever            red eye           abdominal pain           bruising  or  bleeding                joint pain                severe headache               Have you  or someone you have been in contact with traveled internationally in th last month?         If yes, which countries?          NO   Have you  or someone you have been in contact with traveled outside [place] in th last month?         If yes, which state and city?NO   COMMENTS OR ACTION PLAN FOR THIS PATIENT:

## 2019-04-19 ENCOUNTER — Other Ambulatory Visit: Payer: Self-pay | Admitting: Emergency Medicine

## 2019-04-20 ENCOUNTER — Ambulatory Visit (INDEPENDENT_AMBULATORY_CARE_PROVIDER_SITE_OTHER): Payer: Self-pay | Admitting: Surgery

## 2019-04-20 ENCOUNTER — Encounter: Payer: Self-pay | Admitting: Surgery

## 2019-04-20 ENCOUNTER — Telehealth: Payer: Self-pay | Admitting: Cardiology

## 2019-04-20 ENCOUNTER — Telehealth: Payer: Self-pay | Admitting: Emergency Medicine

## 2019-04-20 ENCOUNTER — Other Ambulatory Visit: Payer: Self-pay

## 2019-04-20 VITALS — BP 110/72 | HR 71 | Temp 98.0°F | Resp 18 | Ht 70.0 in | Wt 143.8 lb

## 2019-04-20 DIAGNOSIS — I714 Abdominal aortic aneurysm, without rupture, unspecified: Secondary | ICD-10-CM

## 2019-04-20 MED ORDER — TIOTROPIUM BROMIDE-OLODATEROL 2.5-2.5 MCG/ACT IN AERS: 2.0000 | INHALATION_SPRAY | Freq: Every day | RESPIRATORY_TRACT | 1 refills | Status: DC

## 2019-04-20 MED ORDER — CILOSTAZOL 100 MG PO TABS: 100.0000 mg | ORAL_TABLET | Freq: Two times a day (BID) | ORAL | 11 refills | Status: DC

## 2019-04-20 NOTE — Telephone Encounter (Signed)
Pt was started on Cilostazol today at visit with Dr. Trula Slade.  Wife read pt should check with heart doctor before taking.  She is asking if OK with Dr. Marlou Porch for pt to take cilostazol.

## 2019-04-20 NOTE — Telephone Encounter (Signed)
New Message    Pt c/o medication issue:  1. Name of Medication: cilostazol (PLETAL) 100 MG tablet   2. How are you currently taking this medication (dosage and times per day)?   3. Are you having a reaction (difficulty breathing--STAT)?   4. What is your medication issue? Patients wife is calling concerned about whether or not patient should be taken this medication that was just prescribed to him today.

## 2019-04-20 NOTE — Progress Notes (Signed)
Patient name: ASIAH BEFORT MRN: [id number] DOB: August 13, 1941 Sex: male  REASON FOR VISIT:     post op  HISTORY OF PRESENT ILLNESS:   Glen Green is a 78 y.o. male who on 02/25/2019 underwent fenestrated endovascular abdominal aortic aneurysm repair including stenting to bilateral renal arteries.  This was done for a 5.2 cm aneurysm.  His procedure was uncomplicated and is back today for follow-up.  His only complaint today is that of leg pain which predates his operatio.  He is now walking about 50 yards before he gets cramping in his calf.  The right leg is the same as the left.  He does not have any open wounds or rest pain.  CURRENT MEDICATIONS:    Current Outpatient Medications  Medication Sig Dispense Refill  . aspirin 81 MG tablet Take 1 tablet (81 mg total) by mouth daily. 30 tablet 0  . atorvastatin (LIPITOR) 40 MG tablet TAKE 1 TABLET BY MOUTH DAILY AT 6 PM 90 tablet 2  . carvedilol (COREG) 12.5 MG tablet TAKE 1 TABLET (12.5 MG TOTAL) BY MOUTH 2 (TWO) TIMES DAILY 180 tablet 2  . clopidogrel (PLAVIX) 75 MG tablet Take 1 tablet (75 mg total) by mouth daily. 30 tablet 2  . diphenhydramine-acetaminophen (TYLENOL PM) 25-500 MG TABS tablet Take 2 tablets by mouth at bedtime as needed (sleep).     . furosemide (LASIX) 20 MG tablet Take 1 tablet (20 mg total) by mouth daily. 90 tablet 1  . guaiFENesin (MUCINEX) 600 MG 12 hr tablet Take 600 mg by mouth 2 (two) times daily as needed for to loosen phlegm.     Marland Kitchen lisinopril (PRINIVIL,ZESTRIL) 5 MG tablet TAKE 1 TABLET BY MOUTH EVERY DAY (Patient taking differently: Take 5 mg by mouth daily. ) 90 tablet 1  . ondansetron (ZOFRAN ODT) 4 MG disintegrating tablet Take 1 tablet (4 mg total) by mouth every 8 (eight) hours as needed for nausea or vomiting. 20 tablet 0  . STIOLTO RESPIMAT 2.5-2.5 MCG/ACT AERS INHALE 2 PUFFS BY MOUTH INTO THE LUNGS DAILY *NEEDS OFFICE VISIT 1 Inhaler 0   No current  facility-administered medications for this visit.     REVIEW OF SYSTEMS:   [X]  denotes positive finding, [ ]  denotes negative finding Cardiac  Comments:  Chest pain or chest pressure:    Shortness of breath upon exertion:    Short of breath when lying flat:    Irregular heart rhythm:    Constitutional    Fever or chills:      PHYSICAL EXAM:   Vitals:   04/20/19 0916  BP: 110/72  Pulse: 71  Resp: 18  Temp: 98 F (36.7 C)  SpO2: 95%  Weight: 65.2 kg  Height: 5\' 10"  (1.778 m)    GENERAL: The patient is a well-nourished male, in no acute distress. The vital signs are documented above. CARDIOVASCULAR: There is a regular rate and rhythm. PULMONARY: Non-labored respirations Groin incisions have healed nicely  STUDIES:   I have reviewed his CT scan with the following findings:  VASCULAR  1. Interval surgical changes of endovascular aortic repair with a fenestrated [place] bifurcated endoprosthesis including 2 patent renal covered stents. No evidence of endoleak or complicating feature. The excluded aneurysm sac remains stable in size.  NON-VASCULAR  1. Slight interval enlargement of enhancing renal neoplasm in the interpolar left kidney which now measures up to 1.4 cm compared to a maximum of 1.1 cm on 08/06/2018. Consider urology referral if not already  obtained. 2. Surgical changes of prior esophagectomy with gastric pull-through. 3. Mild cardiomegaly. 4. Small right pleural effusion with associated atelectasis. 5. Combined paraseptal and centrilobular emphysema. 6. Punctate right-sided nephrolithiasis  MEDICAL ISSUES:   AAA: By CT scan, the aneurysm is successfully excluded.  His renal stents are widely patent.  I will have him follow-up in 6 months with aortic duplex and renal artery duplex.  Renal Mass: This appears to have enlarged since his last scan.  He is scheduled to see urology next month  Claudication: I have started the patient on cilostazol.  We  discussed the side effect profile.  We also discussed the importance of regular activity.  I will check ABIs when he returns to compare them to his studies last year.  Leia Alf, MD, FACS [facility] [phone] Pager [phone]

## 2019-04-20 NOTE — Telephone Encounter (Signed)
Returned call to spouse Confirmed Stiolto is inhaler taking Spiriva was d/c at last visit. 1 inhaler with 1 refill submitted to pharmacy. Nothing further needed.

## 2019-04-22 NOTE — Telephone Encounter (Signed)
Should be OK. Watch for any signs of bleeding.  Candee Furbish, MD

## 2019-04-23 NOTE — Telephone Encounter (Signed)
Left message for pt - OK to take Pletal but to watch for signs of bleeding.  Requested pt c/b with any further questions or concerns.

## 2019-05-15 IMAGING — DX PORTABLE CHEST - 1 VIEW
1 series · 1 of 1 positions shown · non-contrast
Comparison: CT 12/23/2018, radiograph 04/02/2018

CLINICAL DATA: Decreased saturation AAA repair

EXAM:
PORTABLE CHEST 1 VIEW

[chest]
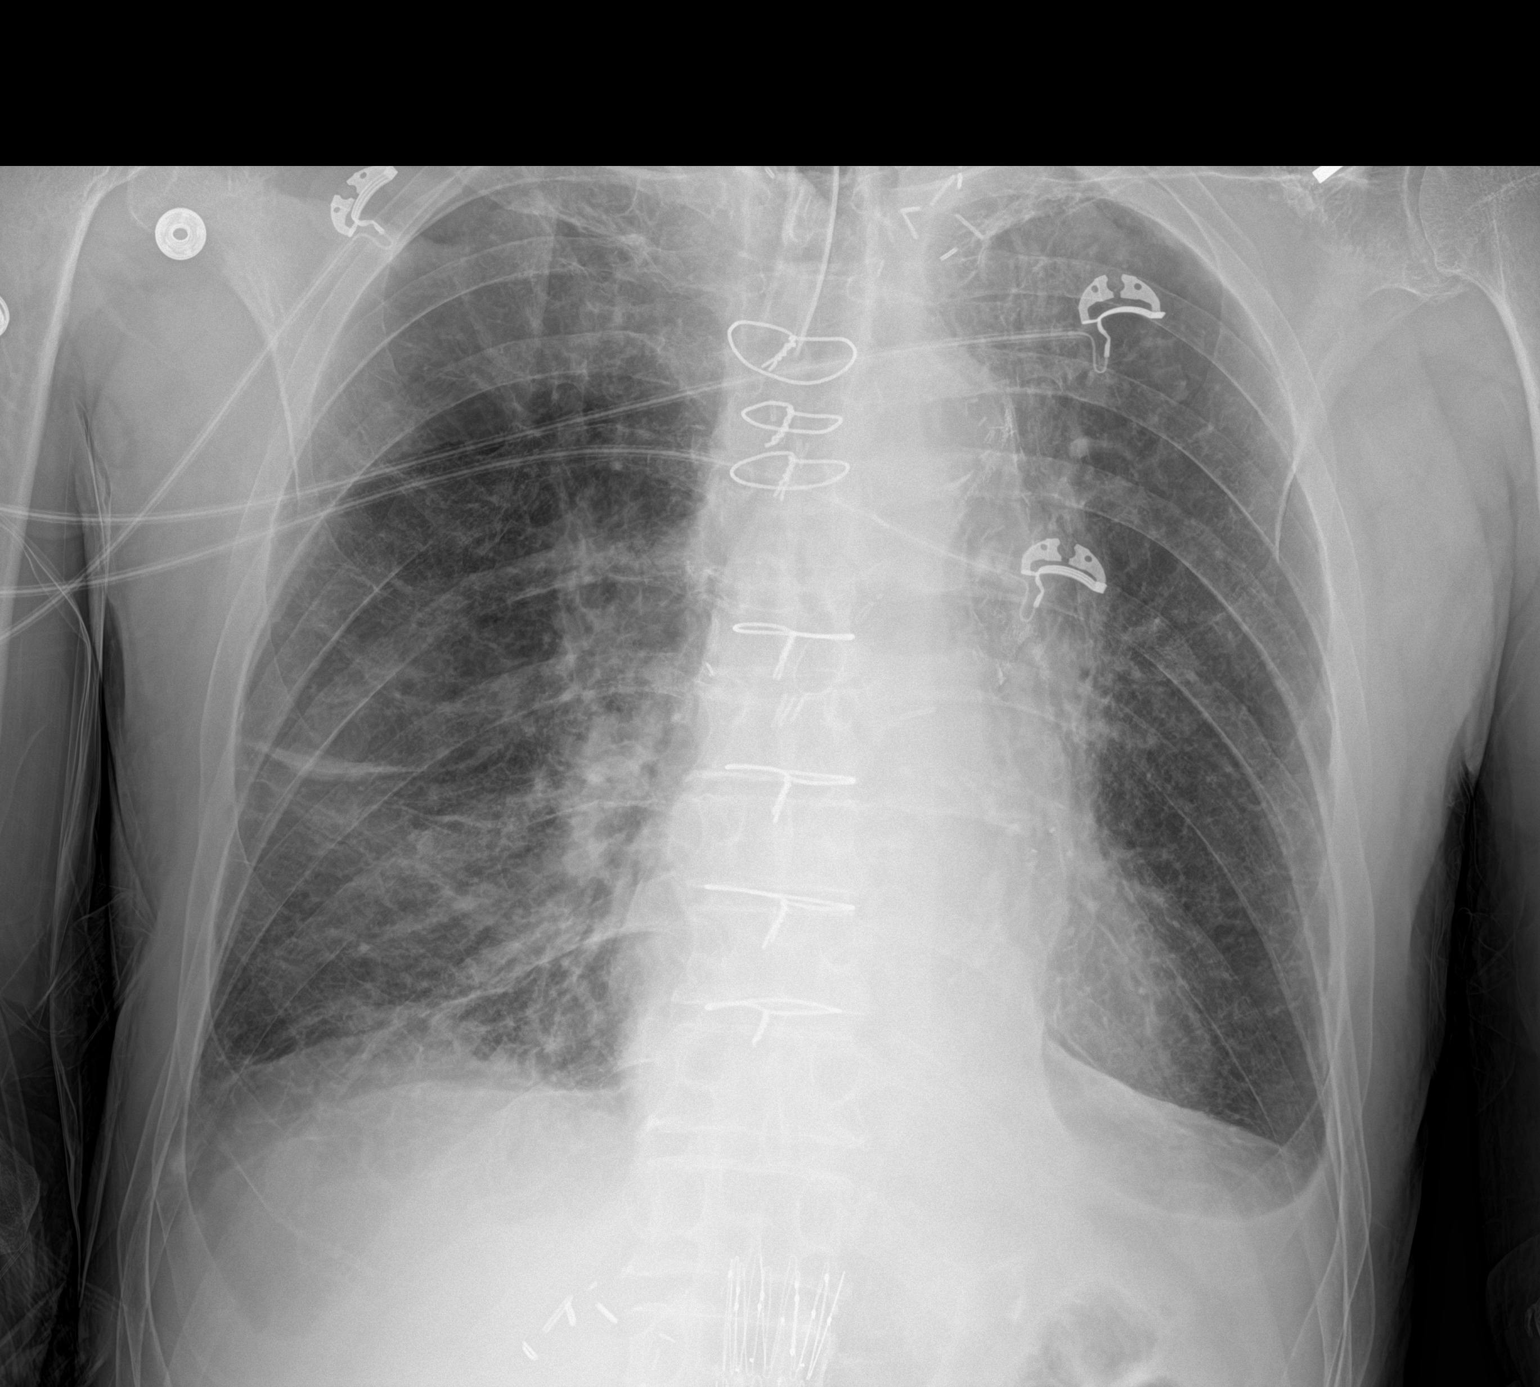

[1 of 1 positions shown; findings below may reference images not displayed]

FINDINGS: Endotracheal tube tip is about 3 cm superior to the carina. Post
sternotomy changes. Partially visualized stent graft over the lower
spine. Small bilateral pleural effusions. Subsegmental atelectasis
in the right lower lung. Mild cardiomegaly with increased
interstitial opacity. Emphysematous disease. Aortic atherosclerosis.
Left apical pleural and parenchymal scarring. No pneumothorax.
IMPRESSION: 1. Endotracheal tube tip about 3 cm superior to the carina.
2. Small pleural effusions. Diffuse increased interstitial opacity,
suspect for acute interstitial edema on underlying chronic disease.
Emphysematous disease.

## 2019-05-19 ENCOUNTER — Other Ambulatory Visit: Payer: Self-pay | Admitting: Cardiology

## 2019-05-19 NOTE — Telephone Encounter (Signed)
Pt's pharmacy is requesting a refill on clopidogrel 75 mg tablet. This medication was started in the hospital. Would Dr. Marlou Porch like to refill this medication? Please address

## 2019-05-21 ENCOUNTER — Other Ambulatory Visit: Payer: Self-pay | Admitting: Surgery

## 2019-05-25 ENCOUNTER — Other Ambulatory Visit: Payer: Self-pay | Admitting: Surgery

## 2019-05-25 DIAGNOSIS — I714 Abdominal aortic aneurysm, without rupture, unspecified: Secondary | ICD-10-CM

## 2019-06-04 ENCOUNTER — Other Ambulatory Visit: Payer: Self-pay | Admitting: Cardiology

## 2019-06-09 ENCOUNTER — Ambulatory Visit: Payer: Medicare Other | Admitting: Emergency Medicine

## 2019-06-09 ENCOUNTER — Encounter: Payer: Self-pay | Admitting: Emergency Medicine

## 2019-06-09 DIAGNOSIS — J431 Panlobular emphysema: Secondary | ICD-10-CM | POA: Diagnosis not present

## 2019-06-09 NOTE — Patient Instructions (Addendum)
Please continue Stiolto once daily as you have been taking it. Keep your albuterol available use 2 puffs if needed for shortness of breath, chest tightness, wheezing. Get the flu shot in the fall Follow with Dr Lamonte Sakai in 6 months or sooner if you have any problems

## 2019-06-09 NOTE — Progress Notes (Signed)
Subjective:    Patient ID: Glen Green, male    DOB: Jul 10, 1941, 78 y.o.   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   He was hospitalized 3/30 to 03/11/2018 for bilateral pleural effusions in the setting of systolic CHF while off of his diuretics.  He has CT scan of his chest on 03/03/2018 that shows severe emphysematous change, loculated right pleural effusion in the right major fissure (pseudotumor) heterogeneous basilar opacities. A TTE 03/10/18 shows some improvement in LVEF at 45%, evidence for secondary PAH, PASP 42 mmHg.  He underwent pulmonary function testing 04/04/2018 that I have reviewed.  The spirometry shows moderately severe mixed restriction and obstruction.  The obstruction is confirmed by positive bronchodilator response.  His FEV1 is 67% predicted.  Lung volumes confirm restriction and his diffusion capacity is decreased and does not correct when adjusted for his alveolar volume. He is on Spiriva and budesonide nebs bid. Duoneb and albuterol nebs are on his med list but not clear that he takes this.  He and his wife are quite confused about her medications, their names, etc, but after a lot of questioning I do believe that he is actually taking the budesonide and the Spiriva as directed.  6-minute walk was done today, walk distance 384 m with no evidence of desaturation.  He is not yet back to playing golf. He mowed the grass 5/1 on a riding mower. Denies much cough   ROV 06/09/2019 --78 year old gentleman with history of tobacco and COPD, esophageal cancer post transhiatal esophagogastrectomy (2004), left upper lobectomy for squamous cell lung cancer (2011).  He also has significant left-sided heart disease due to coronary  artery disease, hypertension, atrial fibrillation.  At his last visit we attempted to build on his bronchodilator regimen.  I change his Spiriva to Stiolto, added albuterol HFA to see if he would benefit.  He reports today that he isn't sure whether there is a big difference between the two. He is still playing golf, gets some dyspnea when he climbs a hill. He has only use SABA once since last visit. No cough, denies any wheeze, no exacerbations.   In March he underwent a AAA stent graft w Dr Trula Slade.   Review of Systems  Constitutional: Negative for fever and unexpected weight change.  HENT: Negative for congestion, dental problem, ear pain, nosebleeds, postnasal drip, rhinorrhea, sinus pressure, sneezing, sore throat and trouble swallowing.   Eyes: Negative for redness and itching.  Respiratory: Positive for shortness of breath. Negative for cough, chest tightness and wheezing.   Cardiovascular: Negative for palpitations and leg swelling.  Gastrointestinal: Negative for nausea and vomiting.  Genitourinary: Negative for dysuria.  Musculoskeletal: Negative for joint swelling.  Skin: Negative for rash.  Neurological: Negative for headaches.  Hematological: Does not bruise/bleed easily.  Psychiatric/Behavioral: Negative for dysphoric mood. The patient is not nervous/anxious.        Objective:   Physical Exam Vitals:   06/09/19 1007  BP: 108/66  Pulse: 78  SpO2: 97%  Weight: 136 lb (61.7 kg)  Height: 5\' 10"  (1.778 m)   Gen: Pleasant, thin man, in no distress,  normal affect  ENT: No lesions,  mouth clear,  oropharynx clear, no postnasal drip, hearing aides in place  Neck: No JVD, no stridor  Lungs: No use of accessory muscles, no dullness to percussion, clear without rales or rhonchi  Cardiovascular: RRR, heart sounds normal, no murmur or gallops, no peripheral edema  Musculoskeletal: No deformities, no cyanosis or clubbing  Neuro: awake, alert, poor historians but  redirectable. No focal deficits  Skin: Warm, no lesions or rash      Assessment & Plan:  Pulmonary emphysema (HCC) Stable at this time.  Unclear whether he got significant benefit from the changed to Stiolto from [place] but his functional capacity may be slightly improved.  I think we should continue the Stiolto as ordered.  He has albuterol, uses rarely.  No evidence of desaturation.  His hypoxemia in in the past has often correlated with his volume status.   Please continue Stiolto once daily as you have been taking it. Keep your albuterol available use 2 puffs if needed for shortness of breath, chest tightness, wheezing. Get the flu shot in the fall Follow with Dr Lamonte Sakai in 6 months or sooner if you have any problems  Baltazar Apo, MD, PhD 06/09/2019, 10:34 AM [facility] [phone] or if no answer [phone]

## 2019-06-09 NOTE — Assessment & Plan Note (Signed)
Stable at this time.  Unclear whether he got significant benefit from the changed to Stiolto from [place] but his functional capacity may be slightly improved.  I think we should continue the Stiolto as ordered.  He has albuterol, uses rarely.  No evidence of desaturation.  His hypoxemia in in the past has often correlated with his volume status.   Please continue Stiolto once daily as you have been taking it. Keep your albuterol available use 2 puffs if needed for shortness of breath, chest tightness, wheezing. Get the flu shot in the fall Follow with Dr Lamonte Sakai in 6 months or sooner if you have any problems

## 2019-06-11 ENCOUNTER — Ambulatory Visit: Payer: Medicare Other | Admitting: Emergency Medicine

## 2019-06-14 ENCOUNTER — Other Ambulatory Visit: Payer: Self-pay | Admitting: Emergency Medicine

## 2019-06-17 ENCOUNTER — Ambulatory Visit
Admission: RE | Admit: 2019-06-17 | Discharge: 2019-06-17 | Disposition: A | Discharge status: Home / Self Care | Payer: Medicare Other | Source: Ambulatory Visit | Attending: Surgery | Admitting: Surgery

## 2019-06-17 DIAGNOSIS — I714 Abdominal aortic aneurysm, without rupture, unspecified: Secondary | ICD-10-CM

## 2019-06-17 MED ORDER — IOPAMIDOL (ISOVUE-370) INJECTION 76%
60.0000 mL | Freq: Once | INTRAVENOUS | Status: AC | PRN
  Administered 2019-06-17: 60 mL via INTRAVENOUS

## 2019-06-22 ENCOUNTER — Ambulatory Visit: Payer: Medicare Other | Admitting: Surgery

## 2019-07-20 ENCOUNTER — Encounter: Payer: Self-pay | Admitting: Surgery

## 2019-07-20 ENCOUNTER — Other Ambulatory Visit: Payer: Self-pay | Admitting: Dermatology

## 2019-07-20 ENCOUNTER — Other Ambulatory Visit: Payer: Self-pay

## 2019-07-20 ENCOUNTER — Ambulatory Visit (INDEPENDENT_AMBULATORY_CARE_PROVIDER_SITE_OTHER): Payer: Medicare Other | Admitting: Surgery

## 2019-07-20 DIAGNOSIS — I714 Abdominal aortic aneurysm, without rupture, unspecified: Secondary | ICD-10-CM

## 2019-07-20 NOTE — Progress Notes (Signed)
[facility] [place]  Patient name: Glen Green MRN: [id number] DOB: 04/09/1941 Sex: male      Virtual Visit via Telephone Note   This visit type was conducted due to national recommendations for restrictions regarding the COVID-19 Pandemic (e.g. social distancing) in an effort to limit this patient's exposure and mitigate transmission in our community.  Due to his co-morbid illnesses, this patient is at least at moderate risk for complications without adequate follow up.  This format is felt to be most appropriate for this patient at this time.  The patient did not have access to video technology/had technical difficulties with video requiring transitioning to audio format only (telephone).  All issues noted in this document were discussed and addressed.  No physical exam could be performed with this format.    Patient Location: Home Provider Location: Office    [place]:    Follow up AAA  HISTORY OF PRESENT ILLNESS:   Glen Green is a 78 y.o. male, who is being talked to after CTA for follow up of FEVAR.  He is s/p FEVAR on 02-25-2019 for a 5.c cm AAA.  His post op course was uncomplicated  He does complain of back pain.  He stopped taking plavix because of bleeding.   He has a history of esophageal cancer, status post distal esophagectomy and 2004, followed by chemotherapy. He is also status post right adrenalectomy and left upper lobectomy in 2011, presumably for metastatic disease.    He suffers from hypercholesterolemia, treated with a statin. He has a history of smoking but quit in 2004. He also has a history of a ruptured cerebral aneurysm  The patient does not have symptoms concerning for COVID-19 infection (fever, chills, cough, or new shortness of breath).   PAST MEDICAL HISTORY    Past Medical History:  Diagnosis Date  . AAA (abdominal aortic aneurysm) ([place]) 08/22/2012   3.9 cm by CT  - June 2013, stable    . 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 Atrial fibrillation ([place])   . CAD (coronary artery disease)   . Cancer ([place])    Esophageal, adrenal gland, skin; lung  . Carotid artery occlusion   . Cerebral aneurysm    [place]. repair  1994  . Dyslipidemia   . History of esophageal cancer    s/p transhiatal esophagogastrectomy  . History of lung cancer   . Hypothyroidism   . Pneumonia   . Postoperative atrial fibrillation ([place]) 12/04/2013   Short course of amiodarone, resolved. Postop bypass  . SBO (small bowel obstruction) ([place]) 08/15/2012   History of small bowel obstruction (status post small bowel       resection, lysis of adhesions, incidental appendectomy, and repair       of left diaphragmatic hernia  . Stroke [facility]) 1995   denies residual     FAMILY HISTORY   Family History  Problem Relation Age of Onset  . Aneurysm Mother   . Kidney disease Father   . Heart disease Maternal Uncle   . Heart disease Paternal Uncle   . Aneurysm Maternal Grandmother   . Diabetes Sister     SOCIAL HISTORY:   Social History   Socioeconomic History  . Marital status: Married    Spouse name: Not on file  . Number of children: Not on file  . Years of education: Not on file  .  Highest education level: Not on file  Occupational History  . Occupation: retired  Scientific laboratory technician  . Financial resource strain: Not on file  . Food insecurity    Worry: Not on file    Inability: Not on file  . Transportation needs    Medical: Not on file    Non-medical: Not on file  Tobacco Use  . Smoking status: Former Smoker    Types: Cigarettes    Quit date: 12/10/2002    Years since quitting: 16.6  . Smokeless tobacco: Never Used  Substance and Sexual Activity  . Alcohol use: No  . Drug use: No  . Sexual activity: Not Currently  Lifestyle  . Physical activity    Days per week: Not on file    Minutes per session: Not on file  . Stress: Not on file  Relationships  . Social Product manager on phone: Not on file    Gets together: Not on file    Attends religious service: Not on file    Active member of club or organization: Not on file    Attends meetings of clubs or organizations: Not on file    Relationship status: Not on file  . Intimate partner violence    Fear of current or ex partner: Not on file    Emotionally abused: Not on file    Physically abused: Not on file    Forced sexual activity: Not on file  Other Topics Concern  . Not on file  Social History Narrative   Pt lives with wife.     ALLERGIES:    Allergies  Allergen Reactions  . Quinolones Other (See Comments)    Patient was warned about not using Cipro and similar antibiotics. Recent studies have raised concern that fluoroquinolone antibiotics could be associated with an increased risk of aortic aneurysm Fluoroquinolones have non-antimicrobial properties that might jeopardise the integrity of the extracellular matrix of the vascular wall In a  propensity score matched cohort study in [place], there was a 66% increased rate of aortic aneurysm or dissection associated with oral fluoroquinolone use, compared wit    CURRENT MEDICATIONS:    Current Outpatient Medications  Medication Sig Dispense Refill  . aspirin 81 MG tablet Take 1 tablet (81 mg total) by mouth daily. 30 tablet 0  . atorvastatin (LIPITOR) 40 MG tablet TAKE 1 TABLET BY MOUTH DAILY AT 6 PM 90 tablet 2  . carvedilol (COREG) 12.5 MG tablet TAKE 1 TABLET (12.5 MG TOTAL) BY MOUTH 2 (TWO) TIMES DAILY 180 tablet 2  . clopidogrel (PLAVIX) 75 MG tablet TAKE 1 TABLET BY MOUTH EVERY DAY 30 tablet 2  . diphenhydramine-acetaminophen (TYLENOL PM) 25-500 MG TABS tablet Take 2 tablets by mouth at bedtime as needed (sleep).     . furosemide (LASIX) 20 MG tablet Take 1 tablet (20 mg total) by mouth daily. 90 tablet 1  . guaiFENesin (MUCINEX) 600 MG 12 hr tablet Take 600 mg by mouth 2 (two) times daily as needed for to loosen phlegm.     Marland Kitchen lisinopril  (ZESTRIL) 5 MG tablet TAKE 1 TABLET BY MOUTH EVERY DAY 90 tablet 1  . ondansetron (ZOFRAN ODT) 4 MG disintegrating tablet Take 1 tablet (4 mg total) by mouth every 8 (eight) hours as needed for nausea or vomiting. 20 tablet 0  . STIOLTO RESPIMAT 2.5-2.5 MCG/ACT AERS INHALE 2 PUFFS BY MOUTH INTO THE LUNGS DAILY 4 g 5   No current facility-administered medications for this visit.  REVIEW OF SYSTEMS:   Please see the history of present illness.     All other systems reviewed and are negative.     Recent Labs: 02/18/2019: ALT 22 02/25/2019: Magnesium 1.8 02/27/2019: BUN 40; Creatinine, Ser 1.57; Hemoglobin 9.8; Platelets 112; Potassium 4.3; Sodium 138   Recent Lipid Panel Lab Results  Component Value Date/Time   CHOL 92 07/31/2013 05:00 AM   TRIG 73 07/31/2013 05:00 AM   HDL 35 (L) 07/31/2013 05:00 AM   CHOLHDL 2.6 07/31/2013 05:00 AM   LDLCALC 42 07/31/2013 05:00 AM    Wt Readings from Last 3 Encounters:  06/09/19 136 lb (61.7 kg)  04/20/19 143 lb 12.8 oz (65.2 kg)  02/27/19 142 lb 6.7 oz (64.6 kg)     STUDIES:   I have reviewed his CTA with the following findings: CTA CHEST  1. Stable mild fusiform aneurysmal dilation of the tubular portion of the ascending thoracic aorta with a maximal diameter of 4.1 cm. Aortic aneurysm NOS (ICD10-I71.9); Aortic Atherosclerosis (ICD10-170.0) 2. Loculated right-sided pleural effusion with fluid tracking along the minor and major fissures. The fluid within the minor fissure yields a pseudo mass appearance, however there is no evidence of enhancing soft tissue to suggest a true malignancy. The loculation is likely secondary to pleural scarring from the patient's prior transthoracic esophagectomy and gastric pull-through procedure. 3. Additional ancillary findings as above without significant interval change including Emphysema (ICD10-J43.9).  CTA ABD/PELVIS  1. Successful endovascular aortic repair of complex abdominal aortic  aneurysm utilizing a fenestrated bifurcated [place] endoprosthesis with patent bilateral renal artery stent grafts. The excluded aneurysm sac has now slightly decreased in size compared to the preoperative imaging and measures 5.0 x 4.7 cm. No evidence of endoleak or other complicating feature. 2. 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 3. Additional ancillary findings as above without significant interval change.  ASSESSMENT and PLAN   AAA:  Delene Ruffini is stable.  No endoleak.  Renal stents and SMA are widely patent.  I will repeat his CTA in 6 months  Renal cell:  He has seen urology, and they are monitoring:  Carotid Stenosis:  Repeat u/s in 6 months  Back pain:  He has a  follow up with his PCP in the next week or so.  I doubt this is related to his AAA repair.  COVID-19 Education: The signs and symptoms of COVID-19 were discussed with the patient and how to seek care for testing (follow up with PCP or arrange E-visit).  The importance of social distancing was discussed today.  Time:   Today, I have spent 16 minutes with the patient with telehealth technology discussing the above problems.    Disposition:  Follow up 6 months    Leia Alf, MD, FACS [facility] [phone] Pager [phone]

## 2019-08-06 ENCOUNTER — Encounter: Payer: Self-pay | Admitting: Cardiology

## 2019-08-15 ENCOUNTER — Other Ambulatory Visit: Payer: Self-pay | Admitting: Vascular Surgery

## 2019-09-03 ENCOUNTER — Other Ambulatory Visit: Payer: Self-pay | Admitting: Cardiology

## 2019-09-30 ENCOUNTER — Other Ambulatory Visit (HOSPITAL_COMMUNITY): Payer: Self-pay | Admitting: Family

## 2019-09-30 ENCOUNTER — Ambulatory Visit (HOSPITAL_COMMUNITY)
Admission: RE | Admit: 2019-09-30 | Discharge: 2019-09-30 | Disposition: A | Discharge status: Home / Self Care | Payer: Medicare Other | Source: Ambulatory Visit | Attending: Family | Admitting: Family

## 2019-09-30 ENCOUNTER — Other Ambulatory Visit: Payer: Self-pay

## 2019-09-30 ENCOUNTER — Ambulatory Visit (INDEPENDENT_AMBULATORY_CARE_PROVIDER_SITE_OTHER)
Admission: RE | Admit: 2019-09-30 | Discharge: 2019-09-30 | Disposition: A | Discharge status: Home / Self Care | Payer: Medicare Other | Source: Ambulatory Visit | Attending: Family | Admitting: Family

## 2019-09-30 ENCOUNTER — Encounter: Payer: Self-pay | Admitting: Family

## 2019-09-30 ENCOUNTER — Ambulatory Visit (INDEPENDENT_AMBULATORY_CARE_PROVIDER_SITE_OTHER): Payer: Medicare Other | Admitting: Family

## 2019-09-30 VITALS — BP 113/75 | HR 68 | Temp 97.9°F | Resp 14 | Ht 70.0 in | Wt 135.0 lb

## 2019-09-30 DIAGNOSIS — M79604 Pain in right leg: Secondary | ICD-10-CM

## 2019-09-30 DIAGNOSIS — I739 Peripheral vascular disease, unspecified: Secondary | ICD-10-CM | POA: Insufficient documentation

## 2019-09-30 DIAGNOSIS — M79605 Pain in left leg: Secondary | ICD-10-CM | POA: Diagnosis not present

## 2019-09-30 DIAGNOSIS — M79661 Pain in right lower leg: Secondary | ICD-10-CM

## 2019-09-30 DIAGNOSIS — I714 Abdominal aortic aneurysm, without rupture, unspecified: Secondary | ICD-10-CM

## 2019-09-30 DIAGNOSIS — Z95828 Presence of other vascular implants and grafts: Secondary | ICD-10-CM | POA: Diagnosis not present

## 2019-09-30 DIAGNOSIS — I779 Disorder of arteries and arterioles, unspecified: Secondary | ICD-10-CM

## 2019-09-30 DIAGNOSIS — M79662 Pain in left lower leg: Secondary | ICD-10-CM

## 2019-09-30 NOTE — Progress Notes (Signed)
[facility] [place]  CC: Follow up s/p Endovascular Repair of Abdominal Aortic Aneurysm    History of Present Illness  Glen Green is a 78 y.o. (10/08/1941) male who is s/p FEVAR on 02-25-2019 by Dr. Trula Slade for a 5.c cm AAA.  His post op course was uncomplicated.  Dr. Trula Slade last evaluated him by virtual visit on 07-20-19. At that time pt stopped taking plavix because of bleeding. AAA:  Glen Green was stable.  No endoleak.  Renal stents and SMA were widely patent.  I will repeat his CTA in 6 months Renal cell:  He has seen urology, and they are monitoring: Carotid Stenosis:  Repeat u/s in 6 months Back pain:  He had a  follow up scheduled with his PCP in the next week or so.  Dr. Trula Slade doubted this was related to his AAA repair.  He has a history of esophageal cancer, status post distal esophagectomy and 2004, followed by chemotherapy. He is also status post right adrenalectomy and left upper lobectomy in 2011, presumably for metastatic disease. He suffers from hypercholesterolemia, treated with a statin.  He also has a history of a ruptured cerebral aneurysm  The patient does not have symptoms concerning for COVID-19 infection (fever, chills, cough, or new shortness of breath).  He does complain of back pain.   He returns today with c/o bilateral calf rest pain for the last 2 days. 2 days ago he [place], and left calf was very painful. Before 2 days ago had no calf pain with walking.   He denies chest pain or dyspnea.   Diabetic: yes, 7.1 A1C on 08-06-19, serum creatinine was 1.57 on that date Tobaccos use: former smoker, quit in 2004 when he had esophogeal cancer    Past Medical History:  Diagnosis Date  . AAA (abdominal aortic aneurysm) ([place]) 08/22/2012   3.9 cm by CT  - June 2013, stable   . 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 Atrial fibrillation ([place])   . CAD (coronary artery disease)   . Cancer ([place])     Esophageal, adrenal gland, skin; lung  . Carotid artery occlusion   . Cerebral aneurysm    [place]. repair  1994  . Dyslipidemia   . History of esophageal cancer    s/p transhiatal esophagogastrectomy  . History of lung cancer   . Hypothyroidism   . Pneumonia   . Postoperative atrial fibrillation ([employer]) 12/04/2013   Short course of amiodarone, resolved. Postop bypass  . SBO (small bowel obstruction) ([place]) 08/15/2012   History of small bowel obstruction (status post small bowel       resection, lysis of adhesions, incidental appendectomy, and repair       of left diaphragmatic hernia  . Stroke [facility]) 1995   denies residual   Past Surgical History:  Procedure Laterality Date  . ABDOMINAL AORTIC ENDOVASCULAR FENESTRATED STENT GRAFT N/A 02/25/2019   Procedure: ABDOMINAL AORTIC ENDOVASCULAR FENESTRATED STENT GRAFT;  Surgeon: Serafina Mitchell, MD;  Location: [facility];  Service: Vascular;  Laterality: N/A;  . BRAIN SURGERY     brain aneursyn  . CATARACT EXTRACTION W/ INTRAOCULAR LENS  IMPLANT, BILATERAL    . CHOLECYSTECTOMY    . CORONARY ARTERY BYPASS GRAFT N/A 07/30/2013   Procedure: CORONARY ARTERY BYPASS GRAFTING (CABG) times two on pump using left internal mammary artery and left greater saphenous vein via endovein harvest.;  Surgeon: Gaye Pollack, MD;  Location: [facility] OR;  Service: Open Heart Surgery;  Laterality: N/A;  . Exploratory laparotomy, lysis of adhesions, reduce of incarcerated small bowel and colon from the chest, limited small bowel resection, incidental appendectomy, and then repair of diaphragmatic hernia with AlloDerm mesh  10/27/2009   [place]  . Exploratory laparotomy,exploratory thoracotomy for hemorrhage  02/11/2003   [place]  . LEFT HEART CATHETERIZATION WITH CORONARY ANGIOGRAM N/A 07/30/2013   Procedure: LEFT HEART CATHETERIZATION WITH CORONARY ANGIOGRAM;  Surgeon: Peter M Martinique, MD;  Location: [facility] CATH LAB;  Service: Cardiovascular;  Laterality: N/A;  . Left subclavian Port-  A-Cath insertion  03/09/2004   [place] Done  . left upper lobectomy with node dissection  02/24/2010   [place]  . TONSILLECTOMY    . Transhiatal esophagectomy with cholecystectomy, jejunostomy,  pyloroplasty and removal of right subclavian Port-A- Cath     [place]   Social History Social History   Tobacco Use  . Smoking status: Former Smoker    Types: Cigarettes    Quit date: 12/10/2002    Years since quitting: 16.8  . Smokeless tobacco: Never Used  Substance Use Topics  . Alcohol use: No  . Drug use: No   Family History Family History  Problem Relation Age of Onset  . Aneurysm Mother   . Kidney disease Father   . Heart disease Maternal Uncle   . Heart disease Paternal Uncle   . Aneurysm Maternal Grandmother   . Diabetes Sister    Current Outpatient Medications on File Prior to Visit  Medication Sig Dispense Refill  . aspirin 81 MG tablet Take 1 tablet (81 mg total) by mouth daily. 30 tablet 0  . atorvastatin (LIPITOR) 40 MG tablet TAKE 1 TABLET BY MOUTH DAILY AT 6 PM 90 tablet 2  . carvedilol (COREG) 12.5 MG tablet TAKE 1 TABLET (12.5 MG TOTAL) BY MOUTH 2 (TWO) TIMES DAILY 180 tablet 2  . clopidogrel (PLAVIX) 75 MG tablet TAKE 1 TABLET BY MOUTH EVERY DAY 90 tablet 3  . diphenhydramine-acetaminophen (TYLENOL PM) 25-500 MG TABS tablet Take 2 tablets by mouth at bedtime as needed (sleep).     . furosemide (LASIX) 20 MG tablet Take 1 tablet (20 mg total) by mouth daily. Please make yearly appt with Dr. Marlou Porch for January for future refills. 1st attempt 90 tablet 0  . guaiFENesin (MUCINEX) 600 MG 12 hr tablet Take 600 mg by mouth 2 (two) times daily as needed for to loosen phlegm.     Marland Kitchen lisinopril (ZESTRIL) 5 MG tablet TAKE 1 TABLET BY MOUTH EVERY DAY 90 tablet 1  . ondansetron (ZOFRAN ODT) 4 MG disintegrating tablet Take 1 tablet (4 mg total) by mouth every 8 (eight) hours as needed for nausea or vomiting. 20 tablet 0  . STIOLTO RESPIMAT 2.5-2.5 MCG/ACT AERS INHALE 2 PUFFS BY MOUTH INTO  THE LUNGS DAILY 4 g 5   No current facility-administered medications on file prior to visit.    Allergies  Allergen Reactions  . Quinolones Other (See Comments)    Patient was warned about not using Cipro and similar antibiotics. Recent studies have raised concern that fluoroquinolone antibiotics could be associated with an increased risk of aortic aneurysm Fluoroquinolones have non-antimicrobial properties that might jeopardise the integrity of the extracellular matrix of the vascular wall In a  propensity score matched cohort study in [place], there was a 66% increased rate of aortic aneurysm or dissection associated with oral fluoroquinolone use, compared wit     ROS: See HPI for pertinent positives  and negatives.  Physical Examination  Vitals:   09/30/19 1607  BP: 113/75  Pulse: 68  Resp: 14  Temp: 97.9 F (36.6 C)  TempSrc: Temporal  SpO2: 98%  Weight: 135 lb (61.2 kg)  Height: 5\' 10"  (1.778 m)   Body mass index is 19.37 kg/m.  General: A&O x 3, WD, thin male accompanied by his wife HEENT: No gross abnormalities  Pulmonary: Sym exp, respirations are non labored, fair air movement in all fields, + crackles in both bases, no rhonchi or wheezes Cardiac: Regular rhythm and rate, no murmur appreciated  Vascular: Vessel Right Left  Radial Palpable Palpable  Carotid  without bruit  without bruit  Aorta Not palpable, stent graft palpated (pt is thin) N/A  Femoral Palpable Palpable  Popliteal Not palpable Not palpable  PT Not Palpable Not Palpable  DP Not Palpable Not Palpable   Gastrointestinal: soft, NTND, -G/R, - HSM, - palpable masses, - CVAT B. Musculoskeletal: M/S 5/5 throughout, extremities without ischemic changes Skin: No rashes, no ulcers, no cellulitis.   Neurologic: Pain and light touch intact in extremities, Motor exam as listed above. CN 2-12 grossly intact except has significant hearing loss Psychiatric: Normal thought content, mood appropriate for  clinical situation.     DATA  Bilateral LE Arterial Duplex (09-30-19): +----------+--------+-----+--------+----------+---------+ RIGHT     PSV cm/sRatioStenosisWaveform  Comments  +----------+--------+-----+--------+----------+---------+ CFA Distal99                   triphasic           +----------+--------+-----+--------+----------+---------+ DFA       85                   biphasic            +----------+--------+-----+--------+----------+---------+ SFA Prox  58                   biphasic            +----------+--------+-----+--------+----------+---------+ SFA Mid   43                   biphasic            +----------+--------+-----+--------+----------+---------+ SFA Distal42                   monophasic          +----------+--------+-----+--------+----------+---------+ POP Prox  200                  monophasic          +----------+--------+-----+--------+----------+---------+ POP Distal14                   monophasicRetrgrade +----------+--------+-----+--------+----------+---------+ ATA Distal26                   monophasic          +----------+--------+-----+--------+----------+---------+ PTA Distal15                   monophasic          +----------+--------+-----+--------+----------+---------+  A focal velocity elevation of 200 cm/s was obtained at DIstal SFA/ Prox pop with a VR of 4.8. Findings are characteristic of 75-99% stenosis.  +----------+--------+-----+--------+----------+--------+ LEFT      PSV cm/sRatioStenosisWaveform  Comments +----------+--------+-----+--------+----------+--------+ CFA Distal70                   triphasic          +----------+--------+-----+--------+----------+--------+ DFA  82                   biphasic           +----------+--------+-----+--------+----------+--------+ SFA Prox  65                   triphasic           +----------+--------+-----+--------+----------+--------+ SFA Mid   51                   biphasic           +----------+--------+-----+--------+----------+--------+ SFA Distal25                   monophasic         +----------+--------+-----+--------+----------+--------+ POP Prox  60                   biphasic           +----------+--------+-----+--------+----------+--------+ POP Distal424                  monophasic         +----------+--------+-----+--------+----------+--------+ ATA Distal14                   monophasic         +----------+--------+-----+--------+----------+--------+ PTA Distal18                   monophasic         +----------+--------+-----+--------+----------+--------+  A focal velocity elevation of 424 cm/s was obtained at Distal popliteal artery with a VR of 7.1. Findings are characteristic of 75-99% stenosis.   Summary: Right: 75-99% stenosis noted in the popliteal artery.  Left: 75-99% stenosis noted in the popliteal artery.   ABI Findings (09-30-19): +---------+------------------+-----+----------+--------+ Right    Rt Pressure (mmHg)IndexWaveform  Comment  +---------+------------------+-----+----------+--------+ Brachial 108                                       +---------+------------------+-----+----------+--------+ ATA      72                0.67                    +---------+------------------+-----+----------+--------+ PTA      71                0.66 monophasic         +---------+------------------+-----+----------+--------+ DP                              monophasic         +---------+------------------+-----+----------+--------+ Great Toe0                 0.00                    +---------+------------------+-----+----------+--------+  +---------+------------------+-----+----------+-------+ Left     Lt Pressure (mmHg)IndexWaveform   Comment +---------+------------------+-----+----------+-------+ Brachial 103                                      +---------+------------------+-----+----------+-------+ ATA      52                0.48                   +---------+------------------+-----+----------+-------+  PTA      69                0.64 monophasic        +---------+------------------+-----+----------+-------+ DP                              monophasic        +---------+------------------+-----+----------+-------+ Great Toe6                 0.06                   +---------+------------------+-----+----------+-------+  +-------+-----------+-----------+------------+------------+ ABI/TBIToday's ABIToday's TBIPrevious ABIPrevious TBI +-------+-----------+-----------+------------+------------+ Right  0.67       0.0        0.70        0.55         +-------+-----------+-----------+------------+------------+ Left   0.64       0.06       0.85        0.61         +-------+-----------+-----------+------------+------------+  Right ABIs appear essentially unchanged compared to prior study on 03/10/2018. Left ABIs appear decreased compared to prior study on 03/10/2018.   Summary: Right: Resting right ankle-brachial index indicates moderate right lower extremity arterial disease. The right toe-brachial index is abnormal. RT great toe pressure = 0 mmHg.  Left: Resting left ankle-brachial index indicates moderate left lower extremity arterial disease. The left toe-brachial index is abnormal. LT Great toe pressure = 6 mmHg.    Medical Decision Making  Glen Green is a 78 y.o. male who presents with bilateral calf rest pain for the last 2 days. He denies dyspnea.  ABI's show 0.67 on the right (was 0.70 on 03-10-18), and 0.64 on the left (declined from 0.85 on 03-10-18), all monophasic waveforms.  Bilateral LE arterial duplex shows no significant stenosis in the right LE (highest  PSV was 200 at the popliteal), and significant stenosis at the left popliteal with PSV of 424.   I discussed with Dr. Scot Dock pt HPI and non invasive arterial results of pt legs from today. Will need bilateral LE venous duplex to r/o DVT since he has rest pain in both calves for the last 2 days, will schedule this for tomorrow.  He has no signs of ischemia in his feet or legs.   He is also s/p FEVAR on 02-25-2019 by Dr. Trula Slade for a 5.c cm AAA.    The next CTA abd/pelvis will be scheduled for February 2021, also carotid duplex at that time, see Dr. Freeman Caldron Adeola Dennen, RN, MSN, FNP-C [facility] [place] Office: [phone]  Clinic Physician: Laqueta Due  09/30/2019, 4:21 PM

## 2019-10-01 ENCOUNTER — Ambulatory Visit (INDEPENDENT_AMBULATORY_CARE_PROVIDER_SITE_OTHER): Payer: Medicare Other | Admitting: Family

## 2019-10-01 ENCOUNTER — Other Ambulatory Visit: Payer: Self-pay

## 2019-10-01 ENCOUNTER — Ambulatory Visit (HOSPITAL_COMMUNITY)
Admission: RE | Admit: 2019-10-01 | Discharge: 2019-10-01 | Disposition: A | Discharge status: Home / Self Care | Payer: Medicare Other | Source: Ambulatory Visit | Attending: Family | Admitting: Family

## 2019-10-01 ENCOUNTER — Encounter: Payer: Self-pay | Admitting: Family

## 2019-10-01 DIAGNOSIS — M79604 Pain in right leg: Secondary | ICD-10-CM | POA: Diagnosis not present

## 2019-10-01 DIAGNOSIS — Z95828 Presence of other vascular implants and grafts: Secondary | ICD-10-CM | POA: Diagnosis not present

## 2019-10-01 DIAGNOSIS — I714 Abdominal aortic aneurysm, without rupture, unspecified: Secondary | ICD-10-CM

## 2019-10-01 DIAGNOSIS — M79661 Pain in right lower leg: Secondary | ICD-10-CM | POA: Diagnosis not present

## 2019-10-01 DIAGNOSIS — M79605 Pain in left leg: Secondary | ICD-10-CM

## 2019-10-01 DIAGNOSIS — Z87891 Personal history of nicotine dependence: Secondary | ICD-10-CM

## 2019-10-01 DIAGNOSIS — I779 Disorder of arteries and arterioles, unspecified: Secondary | ICD-10-CM

## 2019-10-01 DIAGNOSIS — I6523 Occlusion and stenosis of bilateral carotid arteries: Secondary | ICD-10-CM

## 2019-10-01 DIAGNOSIS — M79662 Pain in left lower leg: Secondary | ICD-10-CM

## 2019-10-01 NOTE — Patient Instructions (Signed)
Peripheral Vascular Disease  Peripheral vascular disease (PVD) is a disease of the blood vessels that are not part of your heart and brain. A simple term for PVD is poor circulation. In most cases, PVD narrows the blood vessels that carry blood from your heart to the rest of your body. This can reduce the supply of blood to your arms, legs, and internal organs, like your stomach or kidneys. However, PVD most often affects a person's lower legs and feet. Without treatment, PVD tends to get worse. PVD can also lead to acute ischemic limb. This is when an arm or leg suddenly cannot get enough blood. This is a medical emergency. Follow these instructions at home: Lifestyle  Do not use any products that contain nicotine or tobacco, such as cigarettes and e-cigarettes. If you need help quitting, ask your doctor.  Lose weight if you are overweight. Or, stay at a healthy weight as told by your doctor.  Eat a diet that is low in fat and cholesterol. If you need help, ask your doctor.  Exercise regularly. Ask your doctor for activities that are right for you. General instructions  Take over-the-counter and prescription medicines only as told by your doctor.  Take good care of your feet: ? Wear comfortable shoes that fit well. ? Check your feet often for any cuts or sores.  Keep all follow-up visits as told by your doctor This is important. Contact a doctor if:  You have cramps in your legs when you walk.  You have leg pain when you are at rest.  You have coldness in a leg or foot.  Your skin changes.  You are unable to get or have an erection (erectile dysfunction).  You have cuts or sores on your feet that do not heal. Get help right away if:  Your arm or leg turns cold, numb, and blue.  Your arms or legs become red, warm, swollen, painful, or numb.  You have chest pain.  You have trouble breathing.  You suddenly have weakness in your face, arm, or leg.  You become very  confused or you cannot speak.  You suddenly have a very bad headache.  You suddenly cannot see. Summary  Peripheral vascular disease (PVD) is a disease of the blood vessels.  A simple term for PVD is poor circulation. Without treatment, PVD tends to get worse.  Treatment may include exercise, low fat and low cholesterol diet, and quitting smoking. This information is not intended to replace advice given to you by your health care provider. Make sure you discuss any questions you have with your health care provider. Document Released: 02/20/2010 Document Revised: 11/08/2017 Document Reviewed: 01/03/2017 Elsevier Patient Education  2020 [employer]  

## 2019-10-01 NOTE — Progress Notes (Signed)
[facility] [place]  CC: Follow up s/p Endovascular Repair of Abdominal Aortic Aneurysm    History of Present Illness  Glen Green is a 78 y.o. (04/14/1941) male who is s/p FEVAR on 02-25-2019 by Dr. Trula Slade for a 5.c cm AAA. His post op course was uncomplicated.  Dr. Trula Slade last evaluated him by virtual visit on 07-20-19. At that time pt stopped taking plavix because of bleeding.AAA: Glen Green was stable. No endoleak. Renal stents and SMA were widely patent. I will repeat his CTA in 6 months Renal cell: He has seen urology, and they are monitoring: Carotid Stenosis: Repeat u/s in 6 months Back pain: He had a follow up scheduled with his PCP in the next week or so.  Dr. Trula Slade doubted this was related to his AAA repair.  He has a history of esophageal cancer, status post distal esophagectomy and 2004, followed by chemotherapy. He is also status post right adrenalectomy and left upper lobectomy in 2011, presumably for metastatic disease. He suffers from hypercholesterolemia, treated with a statin.  He also has a history of a ruptured cerebral aneurysm  The patientdoes nothave symptoms concerning for COVID-19 infection (fever, chills, cough, or new shortness of breath).  He does complain of back pain.   He returned yesterday with c/o bilateral calf rest pain for the last 2 days. 2 days ago he walked 30 [place], and left calf was very painful. Before 2 days ago had no calf pain with walking.  He returns today for venous duplex of both legs to r/o DVT.   He denies chest pain or dyspnea.   Diabetic: yes, 7.1 A1C on 08-06-19, serum creatinine was 1.57 on that date Tobaccos use: former smoker, quit in 2004 when he had esophogeal cancer    Past Medical History:  Diagnosis Date  . AAA (abdominal aortic aneurysm) ([place]) 08/22/2012   3.9 cm by CT  - June 2013, stable   . 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 Atrial  fibrillation ([place])   . CAD (coronary artery disease)   . Cancer ([place])    Esophageal, adrenal gland, skin; lung  . Carotid artery occlusion   . Cerebral aneurysm    TX. repair  1994  . Dyslipidemia   . History of esophageal cancer    s/p transhiatal esophagogastrectomy  . History of lung cancer   . Hypothyroidism   . Pneumonia   . Postoperative atrial fibrillation ([place]) 12/04/2013   Short course of amiodarone, resolved. Postop bypass  . SBO (small bowel obstruction) ([place]) 08/15/2012   History of small bowel obstruction (status post small bowel       resection, lysis of adhesions, incidental appendectomy, and repair       of left diaphragmatic hernia  . Stroke [facility]) 1995   denies residual   Past Surgical History:  Procedure Laterality Date  . ABDOMINAL AORTIC ENDOVASCULAR FENESTRATED STENT GRAFT N/A 02/25/2019   Procedure: ABDOMINAL AORTIC ENDOVASCULAR FENESTRATED STENT GRAFT;  Surgeon: Serafina Mitchell, MD;  Location: [facility];  Service: Vascular;  Laterality: N/A;  . BRAIN SURGERY     brain aneursyn  . CATARACT EXTRACTION W/ INTRAOCULAR LENS  IMPLANT, BILATERAL    . CHOLECYSTECTOMY    . CORONARY ARTERY BYPASS GRAFT N/A 07/30/2013   Procedure: CORONARY ARTERY BYPASS GRAFTING (CABG) times two on pump using left internal mammary artery and left greater saphenous vein via endovein harvest.;  Surgeon: Gaye Pollack, MD;  Location: [facility] OR;  Service: Open Heart Surgery;  Laterality: N/A;  . Exploratory laparotomy, lysis of adhesions, reduce of incarcerated small bowel and colon from the chest, limited small bowel resection, incidental appendectomy, and then repair of diaphragmatic hernia with AlloDerm mesh  10/27/2009   [place]  . Exploratory laparotomy,exploratory thoracotomy for hemorrhage  02/11/2003   [place]  . LEFT HEART CATHETERIZATION WITH CORONARY ANGIOGRAM N/A 07/30/2013   Procedure: LEFT HEART CATHETERIZATION WITH CORONARY ANGIOGRAM;  Surgeon: Peter M Martinique, MD;  Location: [facility] CATH  LAB;  Service: Cardiovascular;  Laterality: N/A;  . Left subclavian Port- A-Cath insertion  03/09/2004   [place]  . left upper lobectomy with node dissection  02/24/2010   [place]  . TONSILLECTOMY    . Transhiatal esophagectomy with cholecystectomy, jejunostomy,  pyloroplasty and removal of right subclavian Port-A- Cath     [place]   Social History Social History   Tobacco Use  . Smoking status: Former Smoker    Types: Cigarettes    Quit date: 12/10/2002    Years since quitting: 16.8  . Smokeless tobacco: Never Used  Substance Use Topics  . Alcohol use: No  . Drug use: No   Family History Family History  Problem Relation Age of Onset  . Aneurysm Mother   . Kidney disease Father   . Heart disease Maternal Uncle   . Heart disease Paternal Uncle   . Aneurysm Maternal Grandmother   . Diabetes Sister    Current Outpatient Medications on File Prior to Visit  Medication Sig Dispense Refill  . aspirin 81 MG tablet Take 1 tablet (81 mg total) by mouth daily. 30 tablet 0  . atorvastatin (LIPITOR) 40 MG tablet TAKE 1 TABLET BY MOUTH DAILY AT 6 PM 90 tablet 2  . carvedilol (COREG) 12.5 MG tablet TAKE 1 TABLET (12.5 MG TOTAL) BY MOUTH 2 (TWO) TIMES DAILY 180 tablet 2  . clopidogrel (PLAVIX) 75 MG tablet TAKE 1 TABLET BY MOUTH EVERY DAY 90 tablet 3  . diphenhydramine-acetaminophen (TYLENOL PM) 25-500 MG TABS tablet Take 2 tablets by mouth at bedtime as needed (sleep).     . furosemide (LASIX) 20 MG tablet Take 1 tablet (20 mg total) by mouth daily. Please make yearly appt with Dr. Marlou Porch for January for future refills. 1st attempt 90 tablet 0  . guaiFENesin (MUCINEX) 600 MG 12 hr tablet Take 600 mg by mouth 2 (two) times daily as needed for to loosen phlegm.     Marland Kitchen lisinopril (ZESTRIL) 5 MG tablet TAKE 1 TABLET BY MOUTH EVERY DAY 90 tablet 1  . ondansetron (ZOFRAN ODT) 4 MG disintegrating tablet Take 1 tablet (4 mg total) by mouth every 8 (eight) hours as needed for nausea or vomiting. 20 tablet  0  . STIOLTO RESPIMAT 2.5-2.5 MCG/ACT AERS INHALE 2 PUFFS BY MOUTH INTO THE LUNGS DAILY 4 g 5   No current facility-administered medications on file prior to visit.    Allergies  Allergen Reactions  . Quinolones Other (See Comments)    Patient was warned about not using Cipro and similar antibiotics. Recent studies have raised concern that fluoroquinolone antibiotics could be associated with an increased risk of aortic aneurysm Fluoroquinolones have non-antimicrobial properties that might jeopardise the integrity of the extracellular matrix of the vascular wall In a  propensity score matched cohort study in [place], there was a 66% increased rate of aortic aneurysm or dissection associated with oral fluoroquinolone use, compared wit     ROS: See HPI for pertinent positives  and negatives.  Physical Examination  There were no vitals filed for this visit. There is no height or weight on file to calculate BMI.  General: A&O x 3, WD, thin male accompanied by his wife  HEENT: No gross abnormalities  Pulmonary: Sym exp, respirations are non labored, air air movement in all fields, + crackles in both bases, no rhonchi or wheezes Cardiac: Regular rhythm and rate, no murmur appreciated  Vascular: Vessel Right Left  Radial Palpable Palpable  Carotid  without bruit  without bruit  Aorta Not palpable, stent graft palpated (pt is thin) N/A  Femoral Palpable Palpable  Popliteal Not palpable Not palpable  PT Not Palpable Not Palpable  DP Not Palpable Not Palpable   Gastrointestinal: soft, NTND, -G/R, - HSM, - palpable masses, - CVAT B. Musculoskeletal: M/S 5/5 throughout, extremities without ischemic changes Skin: No rashes, no ulcers, no cellulitis.   Neurologic: Pain and light touch intact in extremities, Motor exam as listed above. CN 2-12 grossly intact except has significant hearing loss Psychiatric: Normal thought content, mood appropriate for clinical situation.    DATA  Bilateral  LE Venous Duplex (10-01-19): Negative for DVT   Bilateral LE Arterial Duplex (09-30-19): +----------+--------+-----+--------+----------+---------+ RIGHT PSV cm/sRatioStenosisWaveform Comments  +----------+--------+-----+--------+----------+---------+ CFA Distal99   triphasic   +----------+--------+-----+--------+----------+---------+ DFA 85   biphasic   +----------+--------+-----+--------+----------+---------+ SFA Prox 58   biphasic   +----------+--------+-----+--------+----------+---------+ SFA Mid 43   biphasic   +----------+--------+-----+--------+----------+---------+ SFA Distal42   monophasic  +----------+--------+-----+--------+----------+---------+ POP Prox 200   monophasic  +----------+--------+-----+--------+----------+---------+ POP Distal14   monophasicRetrgrade +----------+--------+-----+--------+----------+---------+ ATA Distal26   monophasic  +----------+--------+-----+--------+----------+---------+ PTA Distal15   monophasic  +----------+--------+-----+--------+----------+---------+  A focal velocity elevation of 200 cm/s was obtained at DIstal SFA/ Prox pop with a VR of 4.8. Findings are characteristic of 75-99% stenosis.  +----------+--------+-----+--------+----------+--------+ LEFT PSV cm/sRatioStenosisWaveform Comments +----------+--------+-----+--------+----------+--------+ CFA Distal70   triphasic   +----------+--------+-----+--------+----------+--------+ DFA 82   biphasic   +----------+--------+-----+--------+----------+--------+ SFA Prox 65   triphasic    +----------+--------+-----+--------+----------+--------+ SFA Mid 51   biphasic   +----------+--------+-----+--------+----------+--------+ SFA Distal25   monophasic  +----------+--------+-----+--------+----------+--------+ POP Prox 60   biphasic   +----------+--------+-----+--------+----------+--------+ POP Distal424   monophasic  +----------+--------+-----+--------+----------+--------+ ATA Distal14   monophasic  +----------+--------+-----+--------+----------+--------+ PTA Distal18   monophasic  +----------+--------+-----+--------+----------+--------+  A focal velocity elevation of 424 cm/s was obtained at Distal popliteal artery with a VR of 7.1. Findings are characteristic of 75-99% stenosis.  Summary: Right: 75-99% stenosis noted in the popliteal artery.  Left: 75-99% stenosis noted in the popliteal artery.   ABI Findings (09-30-19): +---------+------------------+-----+----------+--------+ Right Rt Pressure (mmHg)IndexWaveform Comment  +---------+------------------+-----+----------+--------+ Brachial 108     +---------+------------------+-----+----------+--------+ ATA 72 0.67    +---------+------------------+-----+----------+--------+ PTA 71 0.66 monophasic  +---------+------------------+-----+----------+--------+ DP   monophasic  +---------+------------------+-----+----------+--------+ Great Toe0 0.00    +---------+------------------+-----+----------+--------+  +---------+------------------+-----+----------+-------+ Left Lt Pressure (mmHg)IndexWaveform  Comment +---------+------------------+-----+----------+-------+ Brachial 103     +---------+------------------+-----+----------+-------+ ATA 52 0.48    +---------+------------------+-----+----------+-------+ PTA 69 0.64 monophasic  +---------+------------------+-----+----------+-------+ DP   monophasic  +---------+------------------+-----+----------+-------+ Great Toe6 0.06    +---------+------------------+-----+----------+-------+  +-------+-----------+-----------+------------+------------+ ABI/TBIToday's ABIToday's TBIPrevious ABIPrevious TBI +-------+-----------+-----------+------------+------------+ Right 0.67 0.0 0.70 0.55  +-------+-----------+-----------+------------+------------+ Left 0.64 0.06 0.85 0.61  +-------+-----------+-----------+------------+------------+  Right ABIs appear essentially unchanged compared to prior study on 03/10/2018. Left ABIs appear decreased compared to prior study on 03/10/2018.  Summary: Right: Resting right ankle-brachial index indicates moderate right lower extremity arterial disease. The right toe-brachial index is abnormal. RT great toe pressure = 0 mmHg.  Left: Resting left ankle-brachial index indicates moderate left lower  extremity arterial disease. The left toe-brachial index is abnormal. LT Great toe pressure = 6 mmHg.    Medical Decision Making  Glen Green is a 78 y.o. male who presents with bilateral calf rest pain for the last 3 days, exacerbated by walking 30 yards and he has to stop. He denies dyspnea.   ABI's  On 09-30-19 show 0.67 on the right (was 0.70 on 03-10-18), and 0.64 on the left (declined from 0.85 on 03-10-18), all monophasic waveforms.  Bilateral LE  arterial duplex shows no significant stenosis in the right LE (highest PSV was 200 at the popliteal), and significant stenosis at the left popliteal with PSV of 424.   Bilateral LE venous duplex on 10-01-19 showed no DVT in either lower extremity.   Will have pt follow up with Dr. Trula Slade at his soonest available to discuss if an arterial intervention would help his calf pain or not.   He has no signs of ischemia in his feet or legs.   He is also s/p FEVAR on 02-25-2019 by Dr. Trula Slade for a 5.c cm AAA.   The next CTA abd/pelvis will be scheduled for February 2021, also carotid duplex at that time, see Dr. Freeman Caldron Nickel, RN, MSN, FNP-C [facility] [place] Office: [phone]  Clinic Physician: Laqueta Due  10/01/2019, 4:00 PM

## 2019-10-05 ENCOUNTER — Encounter: Payer: Self-pay | Admitting: Surgery

## 2019-10-05 ENCOUNTER — Other Ambulatory Visit: Payer: Self-pay

## 2019-10-05 ENCOUNTER — Encounter: Payer: Self-pay | Admitting: *Deleted

## 2019-10-05 ENCOUNTER — Ambulatory Visit (INDEPENDENT_AMBULATORY_CARE_PROVIDER_SITE_OTHER): Payer: Medicare Other | Admitting: Surgery

## 2019-10-05 VITALS — BP 113/78 | HR 82 | Temp 97.5°F | Resp 20 | Ht 70.0 in | Wt 137.5 lb

## 2019-10-05 DIAGNOSIS — I70213 Atherosclerosis of native arteries of extremities with intermittent claudication, bilateral legs: Secondary | ICD-10-CM | POA: Diagnosis not present

## 2019-10-05 NOTE — Progress Notes (Signed)
[facility] [place]  Patient name: Glen Green MRN: [id number] DOB: [birth year] Sex: male   REASON FOR VISIT:    Follow up  [place] ILLNESS:    GEORGE HAGGART is a 78 y.o. male, who is being talked to after CTA for follow up of FEVAR.  He is s/p FEVAR on 02-25-2019 for a 5.c cm AAA.  His post op course was uncomplicated  He does complain of back pain.  He stopped taking plavix because of bleeding.   He has a history of esophageal cancer, status post distal esophagectomy and 2004, followed by chemotherapy. He is also status post right adrenalectomy and left upper lobectomy in 2011, presumably for metastatic disease.  He was seen by [facility] 1 week ago for new onset calf pain with short distance ambulation.  He was brought back for venous Doppler studies which were unremarkable.  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  ABIs were 0.65 bilaterally.  He states that he can now only walk about 30 feet before his left calf begins to cramp and give out.  This is exacerbated by walking up hills and pulling the trash can.  He does not have any open wounds.  He suffers from hypercholesterolemia, treated with a statin. He has a history of smoking but quit in 2004. 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 repair  1994   Dyslipidemia    History of esophageal cancer    s/p transhiatal esophagogastrectomy   History of lung cancer    Hypothyroidism    Pneumonia    Postoperative atrial fibrillation  ([place]) 12/04/2013   Short course of amiodarone, resolved. Postop bypass   SBO (small bowel obstruction) ([place]) 08/15/2012   History of small bowel obstruction (status post small bowel       resection, lysis of adhesions, incidental appendectomy, and repair       of left diaphragmatic hernia   Stroke ([place]) 1995   denies residual     FAMILY HISTORY:   Family History  Problem Relation Age of Onset   Aneurysm Mother    Kidney disease Father    Heart disease Maternal Uncle    Heart disease Paternal Uncle    Aneurysm Maternal Grandmother    Diabetes Sister     SOCIAL HISTORY:   Social History   Tobacco Use   Smoking status: Former Smoker    Types: Cigarettes    Quit date: 12/10/2002    Years since quitting: 16.8   Smokeless tobacco: Never Used  Substance Use Topics   Alcohol use: No     ALLERGIES:   Allergies  Allergen Reactions   Quinolones Other (See Comments)    Patient was warned about not using Cipro and similar antibiotics. Recent studies have raised concern that fluoroquinolone antibiotics could be associated with an increased risk of aortic aneurysm Fluoroquinolones have non-antimicrobial properties that might jeopardise the integrity of the extracellular matrix of the vascular wall In a  propensity score matched cohort study in [place], there was a 66% increased rate of aortic aneurysm or dissection associated with oral fluoroquinolone use, compared wit     CURRENT MEDICATIONS:   Current Outpatient Medications  Medication Sig Dispense Refill   aspirin 81 MG tablet Take 1 tablet (81 mg total) by mouth daily. 30 tablet 0   atorvastatin (LIPITOR) 40 MG tablet TAKE 1 TABLET BY MOUTH DAILY AT 6 PM 90 tablet 2   carvedilol (COREG) 12.5 MG tablet TAKE 1 TABLET (12.5 MG TOTAL) BY MOUTH 2 (TWO) TIMES DAILY 180 tablet 2   clopidogrel (PLAVIX) 75 MG tablet TAKE 1 TABLET BY MOUTH EVERY DAY 90 tablet 3   diphenhydramine-acetaminophen (TYLENOL PM) 25-500 MG TABS  tablet Take 2 tablets by mouth at bedtime as needed (sleep).      furosemide (LASIX) 20 MG tablet Take 1 tablet (20 mg total) by mouth daily. Please make yearly appt with Dr. Marlou Porch for January for future refills. 1st attempt 90 tablet 0   guaiFENesin (MUCINEX) 600 MG 12 hr tablet Take 600 mg by mouth 2 (two) times daily as needed for to loosen phlegm.      lisinopril (ZESTRIL) 5 MG tablet TAKE 1 TABLET BY MOUTH EVERY DAY 90 tablet 1   ondansetron (ZOFRAN ODT) 4 MG disintegrating tablet Take 1 tablet (4 mg total) by mouth every 8 (eight) hours as needed for nausea or vomiting. 20 tablet 0   STIOLTO RESPIMAT 2.5-2.5 MCG/ACT AERS INHALE 2 PUFFS BY MOUTH INTO THE LUNGS DAILY 4 g 5   No current facility-administered medications for this visit.     REVIEW OF SYSTEMS:   [X]  denotes positive finding, [ ]  denotes negative finding Cardiac  Comments:  Chest pain or chest pressure:    Shortness of breath upon exertion:    Short of breath when lying flat:    Irregular heart rhythm:        Vascular    Pain in calf, thigh, or hip brought on by ambulation: x   Pain in feet at night that wakes you up from your sleep:     Blood clot in your veins:    Leg swelling:         Pulmonary    Oxygen at home:    Productive cough:     Wheezing:         Neurologic    Sudden weakness in arms or legs:     Sudden numbness in arms or legs:     Sudden onset of difficulty speaking or slurred speech:    Temporary loss of vision in one eye:     Problems with dizziness:         Gastrointestinal    Blood in stool:     Vomited blood:         Genitourinary    Burning when urinating:     Blood in urine:        Psychiatric    Major depression:         Hematologic    Bleeding problems:    Problems with blood clotting too easily:        Skin    Rashes or ulcers:        Constitutional    Fever or chills:      PHYSICAL EXAM:   Vitals:   10/05/19 1042  BP: 113/78  Pulse: 82  Resp: 20  Temp:  (!) 97.5 F (36.4 C)  SpO2: 97%  Weight:  137 lb 8 oz (62.4 kg)  Height: 5\' 10"  (1.778 m)    GENERAL: The patient is a well-nourished male, in no acute distress. The vital signs are documented above. CARDIAC: There is a regular rate and rhythm.  VASCULAR: Nonpalpable pedal pulses PULMONARY: Non-labored respirations ABDOMEN: Soft and non-tender with normal pitched bowel sounds.  MUSCULOSKELETAL: There are no major deformities or cyanosis. NEUROLOGIC: No focal weakness or paresthesias are detected. SKIN: There are no ulcers or rashes noted. PSYCHIATRIC: The patient has a normal affect.  STUDIES:   I have reviewed his vascular studies with the following findings:  ABI/TBI Today's ABI Today's TBI Previous ABI Previous TBI  +-------+-----------+-----------+------------+------------+  Right   0.67        0.0         0.70         0.55          +-------+-----------+-----------+------------+------------+  Left    0.64        0.06        0.85         0.61          +-------+-----------+-----------+------------+------------+  Venous Dopplers negative for DVT   Left leg duplex: A focal velocity elevation of 424 cm/s was obtained at Distal popliteal artery with a VR of 7.1. Findings are characteristic of 75-99% stenosis.     MEDICAL ISSUES:   Left leg claudication: Patient appears to have progressive symptoms in his left leg.  Ultrasound identifies a popliteal lesion.  His activities are extremely limited.  I think the next option is to proceed with angiography.  I would begin with a right femoral approach and bilateral runoff.  If he is a candidate for intervention he would require antegrade access because of his stent graft.  I have scheduled this for Tuesday, November 3.  Of note, he was unable to tolerate Plavix in the past because of bleeding.    Leia Alf, MD, FACS [facility] [phone] Pager [phone]

## 2019-10-09 ENCOUNTER — Other Ambulatory Visit (HOSPITAL_COMMUNITY)
Admission: RE | Admit: 2019-10-09 | Discharge: 2019-10-09 | Disposition: A | Discharge status: Home / Self Care | Payer: Medicare Other | Source: Ambulatory Visit | Attending: Surgery | Admitting: Surgery

## 2019-10-09 DIAGNOSIS — Z20828 Contact with and (suspected) exposure to other viral communicable diseases: Secondary | ICD-10-CM | POA: Insufficient documentation

## 2019-10-09 DIAGNOSIS — Z01812 Encounter for preprocedural laboratory examination: Secondary | ICD-10-CM | POA: Insufficient documentation

## 2019-10-10 LAB — NOVEL CORONAVIRUS, NAA (HOSP ORDER, SEND-OUT TO REF LAB; TAT 18-24 HRS): SARS-CoV-2, NAA: NOT DETECTED

## 2019-10-13 ENCOUNTER — Inpatient Hospital Stay (HOSPITAL_COMMUNITY): Payer: Medicare Other

## 2019-10-13 ENCOUNTER — Emergency Department (HOSPITAL_COMMUNITY): Payer: Medicare Other

## 2019-10-13 ENCOUNTER — Ambulatory Visit (HOSPITAL_COMMUNITY): Admission: RE | Admit: 2019-10-13 | Payer: Medicare Other | Source: Home / Self Care | Admitting: Surgery

## 2019-10-13 ENCOUNTER — Other Ambulatory Visit: Payer: Self-pay

## 2019-10-13 ENCOUNTER — Encounter (HOSPITAL_COMMUNITY)
Admission: EM | Disposition: A | Discharge status: Home / Self Care | Payer: Self-pay | Source: Home / Self Care | Attending: Family Medicine

## 2019-10-13 ENCOUNTER — Inpatient Hospital Stay (HOSPITAL_COMMUNITY)
Admission: EM | Admit: 2019-10-13 | Discharge: 2019-10-16 | DRG: 292 | Disposition: A | Discharge status: Home / Self Care | Payer: Medicare Other | Attending: Family Medicine | Admitting: Family Medicine

## 2019-10-13 ENCOUNTER — Encounter (HOSPITAL_COMMUNITY): Payer: Self-pay | Admitting: Internal Medicine

## 2019-10-13 DIAGNOSIS — I714 Abdominal aortic aneurysm, without rupture, unspecified: Secondary | ICD-10-CM | POA: Diagnosis present

## 2019-10-13 DIAGNOSIS — I712 Thoracic aortic aneurysm, without rupture: Secondary | ICD-10-CM | POA: Diagnosis present

## 2019-10-13 DIAGNOSIS — I340 Nonrheumatic mitral (valve) insufficiency: Secondary | ICD-10-CM

## 2019-10-13 DIAGNOSIS — N179 Acute kidney failure, unspecified: Secondary | ICD-10-CM

## 2019-10-13 DIAGNOSIS — I509 Heart failure, unspecified: Secondary | ICD-10-CM

## 2019-10-13 DIAGNOSIS — Z923 Personal history of irradiation: Secondary | ICD-10-CM

## 2019-10-13 DIAGNOSIS — J90 Pleural effusion, not elsewhere classified: Secondary | ICD-10-CM

## 2019-10-13 DIAGNOSIS — Z951 Presence of aortocoronary bypass graft: Secondary | ICD-10-CM

## 2019-10-13 DIAGNOSIS — R791 Abnormal coagulation profile: Secondary | ICD-10-CM | POA: Diagnosis present

## 2019-10-13 DIAGNOSIS — I959 Hypotension, unspecified: Secondary | ICD-10-CM | POA: Diagnosis not present

## 2019-10-13 DIAGNOSIS — I083 Combined rheumatic disorders of mitral, aortic and tricuspid valves: Secondary | ICD-10-CM | POA: Diagnosis present

## 2019-10-13 DIAGNOSIS — E785 Hyperlipidemia, unspecified: Secondary | ICD-10-CM

## 2019-10-13 DIAGNOSIS — Z888 Allergy status to other drugs, medicaments and biological substances status: Secondary | ICD-10-CM

## 2019-10-13 DIAGNOSIS — Z9842 Cataract extraction status, left eye: Secondary | ICD-10-CM

## 2019-10-13 DIAGNOSIS — C3491 Malignant neoplasm of unspecified part of right bronchus or lung: Secondary | ICD-10-CM | POA: Diagnosis not present

## 2019-10-13 DIAGNOSIS — Z8673 Personal history of transient ischemic attack (TIA), and cerebral infarction without residual deficits: Secondary | ICD-10-CM

## 2019-10-13 DIAGNOSIS — Z7902 Long term (current) use of antithrombotics/antiplatelets: Secondary | ICD-10-CM

## 2019-10-13 DIAGNOSIS — I447 Left bundle-branch block, unspecified: Secondary | ICD-10-CM | POA: Diagnosis present

## 2019-10-13 DIAGNOSIS — Z8249 Family history of ischemic heart disease and other diseases of the circulatory system: Secondary | ICD-10-CM

## 2019-10-13 DIAGNOSIS — I10 Essential (primary) hypertension: Secondary | ICD-10-CM

## 2019-10-13 DIAGNOSIS — Z79899 Other long term (current) drug therapy: Secondary | ICD-10-CM

## 2019-10-13 DIAGNOSIS — Z9889 Other specified postprocedural states: Secondary | ICD-10-CM

## 2019-10-13 DIAGNOSIS — Z8679 Personal history of other diseases of the circulatory system: Secondary | ICD-10-CM

## 2019-10-13 DIAGNOSIS — J432 Centrilobular emphysema: Secondary | ICD-10-CM | POA: Diagnosis not present

## 2019-10-13 DIAGNOSIS — Z8501 Personal history of malignant neoplasm of esophagus: Secondary | ICD-10-CM

## 2019-10-13 DIAGNOSIS — I480 Paroxysmal atrial fibrillation: Secondary | ICD-10-CM | POA: Diagnosis present

## 2019-10-13 DIAGNOSIS — I110 Hypertensive heart disease with heart failure: Secondary | ICD-10-CM | POA: Diagnosis not present

## 2019-10-13 DIAGNOSIS — R634 Abnormal weight loss: Secondary | ICD-10-CM | POA: Diagnosis present

## 2019-10-13 DIAGNOSIS — I5043 Acute on chronic combined systolic (congestive) and diastolic (congestive) heart failure: Secondary | ICD-10-CM | POA: Diagnosis not present

## 2019-10-13 DIAGNOSIS — Z681 Body mass index (BMI) 19 or less, adult: Secondary | ICD-10-CM

## 2019-10-13 DIAGNOSIS — Z7982 Long term (current) use of aspirin: Secondary | ICD-10-CM

## 2019-10-13 DIAGNOSIS — I739 Peripheral vascular disease, unspecified: Secondary | ICD-10-CM | POA: Diagnosis not present

## 2019-10-13 DIAGNOSIS — Z902 Acquired absence of lung [part of]: Secondary | ICD-10-CM

## 2019-10-13 DIAGNOSIS — E039 Hypothyroidism, unspecified: Secondary | ICD-10-CM | POA: Diagnosis present

## 2019-10-13 DIAGNOSIS — I255 Ischemic cardiomyopathy: Secondary | ICD-10-CM | POA: Diagnosis present

## 2019-10-13 DIAGNOSIS — N1831 Chronic kidney disease, stage 3a: Secondary | ICD-10-CM

## 2019-10-13 DIAGNOSIS — Z9119 Patient's noncompliance with other medical treatment and regimen: Secondary | ICD-10-CM

## 2019-10-13 DIAGNOSIS — Z87891 Personal history of nicotine dependence: Secondary | ICD-10-CM

## 2019-10-13 DIAGNOSIS — R54 Age-related physical debility: Secondary | ICD-10-CM | POA: Diagnosis present

## 2019-10-13 DIAGNOSIS — Z66 Do not resuscitate: Secondary | ICD-10-CM | POA: Diagnosis present

## 2019-10-13 DIAGNOSIS — Z20828 Contact with and (suspected) exposure to other viral communicable diseases: Secondary | ICD-10-CM | POA: Diagnosis present

## 2019-10-13 DIAGNOSIS — J9811 Atelectasis: Secondary | ICD-10-CM | POA: Diagnosis not present

## 2019-10-13 DIAGNOSIS — I361 Nonrheumatic tricuspid (valve) insufficiency: Secondary | ICD-10-CM

## 2019-10-13 DIAGNOSIS — Z85118 Personal history of other malignant neoplasm of bronchus and lung: Secondary | ICD-10-CM

## 2019-10-13 DIAGNOSIS — Z9221 Personal history of antineoplastic chemotherapy: Secondary | ICD-10-CM

## 2019-10-13 DIAGNOSIS — Z9841 Cataract extraction status, right eye: Secondary | ICD-10-CM

## 2019-10-13 DIAGNOSIS — M79662 Pain in left lower leg: Secondary | ICD-10-CM | POA: Diagnosis not present

## 2019-10-13 DIAGNOSIS — I2510 Atherosclerotic heart disease of native coronary artery without angina pectoris: Secondary | ICD-10-CM | POA: Diagnosis present

## 2019-10-13 DIAGNOSIS — Z961 Presence of intraocular lens: Secondary | ICD-10-CM | POA: Diagnosis present

## 2019-10-13 HISTORY — DX: Chronic combined systolic (congestive) and diastolic (congestive) heart failure: I50.42

## 2019-10-13 LAB — LACTIC ACID, PLASMA
Lactic Acid, Venous: 2 mmol/L (ref 0.5–1.9)
Lactic Acid, Venous: 1.1 mmol/L (ref 0.5–1.9)

## 2019-10-13 LAB — BASIC METABOLIC PANEL
Anion gap: 10 (ref 5–15)
BUN: 33 mg/dL — ABNORMAL HIGH (ref 8–23)
CO2: 21 mmol/L — ABNORMAL LOW (ref 22–32)
Calcium: 9.2 mg/dL (ref 8.9–10.3)
Chloride: 111 mmol/L (ref 98–111)
Creatinine, Ser: 1.53 mg/dL — ABNORMAL HIGH (ref 0.61–1.24)
GFR calc Af Amer: 50 mL/min — ABNORMAL LOW (ref >60)
GFR calc non Af Amer: 43 mL/min — ABNORMAL LOW (ref >60)
Glucose, Bld: 98 mg/dL (ref 70–99)
Potassium: 4.9 mmol/L (ref 3.5–5.1)
Sodium: 142 mmol/L (ref 135–145)

## 2019-10-13 LAB — CBC WITH DIFFERENTIAL/PLATELET
Abs Immature Granulocytes: 0.02 10*3/uL (ref 0.00–0.07)
Basophils Absolute: 0 10*3/uL (ref 0.0–0.1)
Basophils Relative: 1 %
Eosinophils Absolute: 0.1 10*3/uL (ref 0.0–0.5)
Eosinophils Relative: 2 %
HCT: 40.6 % (ref 39.0–52.0)
Hemoglobin: 12.4 g/dL — ABNORMAL LOW (ref 13.0–17.0)
Immature Granulocytes: 0 %
Lymphocytes Relative: 19 %
Lymphs Abs: 1 10*3/uL (ref 0.7–4.0)
MCH: 31.6 pg (ref 26.0–34.0)
MCHC: 30.5 g/dL (ref 30.0–36.0)
MCV: 103.6 fL — ABNORMAL HIGH (ref 80.0–100.0)
Monocytes Absolute: 0.8 10*3/uL (ref 0.1–1.0)
Monocytes Relative: 14 %
Neutro Abs: 3.5 10*3/uL (ref 1.7–7.7)
Neutrophils Relative %: 64 %
Platelets: 183 10*3/uL (ref 150–400)
RBC: 3.92 MIL/uL — ABNORMAL LOW (ref 4.22–5.81)
RDW: 18.2 % — ABNORMAL HIGH (ref 11.5–15.5)
WBC: 5.5 10*3/uL (ref 4.0–10.5)
nRBC: 0 % (ref 0.0–0.2)

## 2019-10-13 LAB — RESPIRATORY PANEL BY PCR

## 2019-10-13 LAB — ECHOCARDIOGRAM COMPLETE
Height: 70 in
Weight: 2186.96 oz

## 2019-10-13 LAB — SARS CORONAVIRUS 2 BY RT PCR (HOSPITAL ORDER, PERFORMED IN ~~LOC~~ HOSPITAL LAB): SARS Coronavirus 2: NEGATIVE

## 2019-10-13 LAB — TROPONIN I (HIGH SENSITIVITY)
Troponin I (High Sensitivity): 19 ng/L — ABNORMAL HIGH (ref <18)
Troponin I (High Sensitivity): 21 ng/L — ABNORMAL HIGH (ref <18)

## 2019-10-13 LAB — BRAIN NATRIURETIC PEPTIDE: B Natriuretic Peptide: 1143.3 pg/mL — ABNORMAL HIGH (ref 0.0–100.0)

## 2019-10-13 SURGERY — LOWER EXTREMITY ANGIOGRAPHY
Anesthesia: LOCAL

## 2019-10-13 MED ORDER — SODIUM CHLORIDE 0.9% FLUSH
3.0000 mL | Freq: Two times a day (BID) | INTRAVENOUS | Status: DC
  Administered 2019-10-13 – 2019-10-16 (×4): 3 mL via INTRAVENOUS

## 2019-10-13 MED ORDER — GUAIFENESIN ER 600 MG PO TB12: 600.0000 mg | ORAL_TABLET | Freq: Two times a day (BID) | ORAL | Status: DC | PRN

## 2019-10-13 MED ORDER — FUROSEMIDE 10 MG/ML IJ SOLN
40.0000 mg | Freq: Two times a day (BID) | INTRAMUSCULAR | Status: DC
  Administered 2019-10-13 – 2019-10-14 (×2): 40 mg via INTRAVENOUS
  Filled 2019-10-13 (×2): qty 4

## 2019-10-13 MED ORDER — LORAZEPAM 2 MG/ML IJ SOLN
0.5000 mg | Freq: Once | INTRAMUSCULAR | Status: AC
  Administered 2019-10-13: 0.5 mg via INTRAVENOUS
  Filled 2019-10-13: qty 1

## 2019-10-13 MED ORDER — ENOXAPARIN SODIUM 40 MG/0.4ML ~~LOC~~ SOLN
40.0000 mg | SUBCUTANEOUS | Status: DC
  Administered 2019-10-13 – 2019-10-15 (×3): 40 mg via SUBCUTANEOUS
  Filled 2019-10-13 (×3): qty 0.4

## 2019-10-13 MED ORDER — ACETAMINOPHEN 325 MG PO TABS: 650.0000 mg | ORAL_TABLET | ORAL | Status: DC | PRN

## 2019-10-13 MED ORDER — SODIUM CHLORIDE 0.9 % IV SOLN: 250.0000 mL | INTRAVENOUS | Status: DC | PRN

## 2019-10-13 MED ORDER — ALBUTEROL SULFATE (2.5 MG/3ML) 0.083% IN NEBU: 2.5000 mg | INHALATION_SOLUTION | RESPIRATORY_TRACT | Status: DC | PRN

## 2019-10-13 MED ORDER — IPRATROPIUM-ALBUTEROL 20-100 MCG/ACT IN AERS: 1.0000 | INHALATION_SPRAY | Freq: Four times a day (QID) | RESPIRATORY_TRACT | Status: DC

## 2019-10-13 MED ORDER — CARVEDILOL 12.5 MG PO TABS
12.5000 mg | ORAL_TABLET | Freq: Two times a day (BID) | ORAL | Status: DC
  Administered 2019-10-13 – 2019-10-16 (×4): 12.5 mg via ORAL
  Filled 2019-10-13 (×6): qty 1

## 2019-10-13 MED ORDER — ONDANSETRON HCL 4 MG/2ML IJ SOLN
4.0000 mg | Freq: Four times a day (QID) | INTRAMUSCULAR | Status: DC | PRN
  Administered 2019-10-13: 4 mg via INTRAVENOUS
  Filled 2019-10-13: qty 2

## 2019-10-13 MED ORDER — CLOPIDOGREL BISULFATE 75 MG PO TABS: 75.0000 mg | ORAL_TABLET | Freq: Every day | ORAL | Status: DC

## 2019-10-13 MED ORDER — SODIUM CHLORIDE 0.9% FLUSH: 3.0000 mL | INTRAVENOUS | Status: DC | PRN

## 2019-10-13 MED ORDER — ATORVASTATIN CALCIUM 40 MG PO TABS
40.0000 mg | ORAL_TABLET | Freq: Every day | ORAL | Status: DC
  Administered 2019-10-13 – 2019-10-15 (×3): 40 mg via ORAL
  Filled 2019-10-13 (×3): qty 1

## 2019-10-13 MED ORDER — ASPIRIN EC 81 MG PO TBEC
81.0000 mg | DELAYED_RELEASE_TABLET | Freq: Every day | ORAL | Status: DC
  Administered 2019-10-13 – 2019-10-16 (×4): 81 mg via ORAL
  Filled 2019-10-13 (×4): qty 1

## 2019-10-13 MED ORDER — IPRATROPIUM-ALBUTEROL 20-100 MCG/ACT IN AERS
1.0000 | INHALATION_SPRAY | Freq: Every day | RESPIRATORY_TRACT | Status: DC
  Administered 2019-10-15: 1 via RESPIRATORY_TRACT
  Filled 2019-10-13 (×2): qty 4

## 2019-10-13 MED ORDER — FUROSEMIDE 10 MG/ML IJ SOLN
40.0000 mg | Freq: Once | INTRAMUSCULAR | Status: AC
  Administered 2019-10-13: 40 mg via INTRAVENOUS
  Filled 2019-10-13: qty 4

## 2019-10-13 NOTE — ED Provider Notes (Signed)
Patient signed to me by Dr. Dina Rich pending labs.  Patient has elevated BNP and clinical findings consistent with CHF.  Will give IV Lasix here and admit to the medicine service.  Discussed with Dr. Trula Slade from vascular surgery about patient being admitted   Lacretia Leigh, MD 10/13/19 [phone]

## 2019-10-13 NOTE — ED Triage Notes (Signed)
Pt c/o SOB x 2 days. States was dx with "blockages in my legs last week" and is scheduled for a procedure today at 09:00 "somewhere" on the [facility].

## 2019-10-13 NOTE — Consult Note (Signed)
Cardiology Consultation:   Patient ID: Glen Green; [id number]; 06-02-1941   Admit date: 10/13/2019 Date of Consult: 10/13/2019  Primary Care Provider: Alroy Dust, Carlean Jews.Marlou Sa, MD Primary Cardiologist: Dr. Marlou Porch Primary Electrophysiologist:  None   Patient Profile:   Glen Green is a 78 y.o. male with a hx of lung and esophageal cancer s/p esophagectomy (2004) and left upper lobectomy, AAA s/p FEVAR, CVA, CAD s/p CABG x2, emphysema who is being seen today for the evaluation of shortness of breath at the request of Dr. Lorin Mercy.  History of Present Illness:   Glen Green reports that for the past 3 days he has been having worsening shortness of breath, difficulty laying flat, and waking up with shortness of breath. He was planned to have an angiogram on 11/3 for LLE claudication with identified popliteal lesion however his SOB continued to worsen so he came to the ED. He reports that he has been feeling generally unwell since March however about 3 days ago he started having worsening shortness of breath, worse with lying down, endorsed two-pillow orthopnea, and PND.  He denied any chest pain, palpitation, fevers, chills, nausea, vomiting, headaches, lightheadedness, dizziness.  He does endorse a decreased appetite for a few weeks, endorses drinking more water recently.  Denies any recent travel or recent sick contacts.  He reports taking his medications as prescribed.  He denies any smoking, alcohol, or drug use.  Lives with his wife and her sister in [place].  Worked as a Psychologist, educational but retired in 2004.  Past Medical History:  Diagnosis Date  . AAA (abdominal aortic aneurysm) ([place]) 08/22/2012   3.9 cm by CT  - June 2013, stable   . 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 Atrial fibrillation ([place])   . CAD (coronary artery disease)   . Cancer ([place])    Esophageal, adrenal gland, skin; lung  . Carotid artery occlusion   . Cerebral aneurysm    [place]. repair  1994  .  Chronic combined systolic (congestive) and diastolic (congestive) heart failure ([place])   . Dyslipidemia   . History of esophageal cancer    s/p transhiatal esophagogastrectomy  . History of lung cancer   . Hypothyroidism   . Pneumonia   . Postoperative atrial fibrillation ([place]) 12/04/2013   Short course of amiodarone, resolved. Postop bypass  . SBO (small bowel obstruction) ([place]) 08/15/2012   History of small bowel obstruction (status post small bowel       resection, lysis of adhesions, incidental appendectomy, and repair       of left diaphragmatic hernia  . Stroke [facility]) 1995   denies residual    Past Surgical History:  Procedure Laterality Date  . ABDOMINAL AORTIC ENDOVASCULAR FENESTRATED STENT GRAFT N/A 02/25/2019   Procedure: ABDOMINAL AORTIC ENDOVASCULAR FENESTRATED STENT GRAFT;  Surgeon: Serafina Mitchell, MD;  Location: [facility];  Service: Vascular;  Laterality: N/A;  . BRAIN SURGERY     brain aneursyn  . CATARACT EXTRACTION W/ INTRAOCULAR LENS  IMPLANT, BILATERAL    . CHOLECYSTECTOMY    . CORONARY ARTERY BYPASS GRAFT N/A 07/30/2013   Procedure: CORONARY ARTERY BYPASS GRAFTING (CABG) times two on pump using left internal mammary artery and left greater saphenous vein via endovein harvest.;  Surgeon: Gaye Pollack, MD;  Location: [facility] OR;  Service: Open Heart Surgery;  Laterality: N/A;  . Exploratory laparotomy, lysis of adhesions, reduce of incarcerated small bowel and colon from the chest, limited small bowel resection,  incidental appendectomy, and then repair of diaphragmatic hernia with AlloDerm mesh  10/27/2009   [place]  . Exploratory laparotomy,exploratory thoracotomy for hemorrhage  02/11/2003   [place]  . LEFT HEART CATHETERIZATION WITH CORONARY ANGIOGRAM N/A 07/30/2013   Procedure: LEFT HEART CATHETERIZATION WITH CORONARY ANGIOGRAM;  Surgeon: Peter M Martinique, MD;  Location: [facility] CATH LAB;  Service: Cardiovascular;  Laterality: N/A;  . Left subclavian Port- A-Cath insertion   03/09/2004   [place]  . left upper lobectomy with node dissection  02/24/2010   [place]  . TONSILLECTOMY    . Transhiatal esophagectomy with cholecystectomy, jejunostomy,  pyloroplasty and removal of right subclavian Port-A- Cath     [place]     Home Medications:  Prior to Admission medications   Medication Sig Start Date End Date Taking? Authorizing Provider  acetaminophen (TYLENOL) 500 MG tablet Take 1,000 mg by mouth every 6 (six) hours as needed for mild pain or headache.   Yes [provider]  aspirin 81 MG tablet Take 1 tablet (81 mg total) by mouth daily. 12/04/13  Yes Jerline Pain, MD  atorvastatin (LIPITOR) 40 MG tablet TAKE 1 TABLET BY MOUTH DAILY AT 6 PM Patient taking differently: Take 40 mg by mouth daily at 6 PM.  03/09/19  Yes Skains, Thana Farr, MD  carvedilol (COREG) 12.5 MG tablet TAKE 1 TABLET (12.5 MG TOTAL) BY MOUTH 2 (TWO) TIMES DAILY Patient taking differently: Take 12.5 mg by mouth. TAKE 1 TABLET (12.5 MG TOTAL) BY MOUTH 2 (TWO) TIMES DAILY 03/09/19  Yes Jerline Pain, MD  clopidogrel (PLAVIX) 75 MG tablet TAKE 1 TABLET BY MOUTH EVERY DAY Patient taking differently: Take 75 mg by mouth daily.  08/16/19  Yes Serafina Mitchell, MD  furosemide (LASIX) 20 MG tablet Take 1 tablet (20 mg total) by mouth daily. Please make yearly appt with Dr. Marlou Porch for January for future refills. 1st attempt Patient taking differently: Take 20 mg by mouth daily.  09/03/19  Yes Jerline Pain, MD  guaiFENesin (MUCINEX) 600 MG 12 hr tablet Take 600 mg by mouth 2 (two) times daily as needed for to loosen phlegm.    Yes [provider]  lisinopril (ZESTRIL) 5 MG tablet TAKE 1 TABLET BY MOUTH EVERY DAY Patient taking differently: Take 5 mg by mouth daily.  06/04/19  Yes Jerline Pain, MD  ondansetron (ZOFRAN ODT) 4 MG disintegrating tablet Take 1 tablet (4 mg total) by mouth every 8 (eight) hours as needed for nausea or vomiting. 02/28/19  Yes Serafina Mitchell, MD  STIOLTO RESPIMAT  2.5-2.5 MCG/ACT AERS INHALE 2 PUFFS BY MOUTH INTO THE LUNGS DAILY Patient taking differently: Inhale 2 puffs into the lungs daily.  06/15/19  Yes Collene Gobble, MD    Inpatient Medications: Scheduled Meds: . aspirin EC  81 mg Oral Daily  . atorvastatin  40 mg Oral q1800  . carvedilol  12.5 mg Oral BID WC  . enoxaparin (LOVENOX) injection  40 mg Subcutaneous Q24H  . furosemide  40 mg Intravenous Q12H  . [START ON 10/14/2019] Ipratropium-Albuterol  1 puff Inhalation Daily  . sodium chloride flush  3 mL Intravenous Q12H   Continuous Infusions: . sodium chloride     PRN Meds: sodium chloride, acetaminophen, albuterol, guaiFENesin, ondansetron (ZOFRAN) IV, sodium chloride flush  Allergies:    Allergies  Allergen Reactions  . Quinolones Other (See Comments)    Patient was warned about not using Cipro and similar antibiotics. Recent studies have raised concern that fluoroquinolone  antibiotics could be associated with an increased risk of aortic aneurysm Fluoroquinolones have non-antimicrobial properties that might jeopardise the integrity of the extracellular matrix of the vascular wall In a  propensity score matched cohort study in [place], there was a 66% increased rate of aortic aneurysm or dissection associated with oral fluoroquinolone use, compared wit    Social History:   Social History   Socioeconomic History  . Marital status: Married    Spouse name: Not on file  . Number of children: Not on file  . Years of education: Not on file  . Highest education level: Not on file  Occupational History  . Occupation: retired  Scientific laboratory technician  . Financial resource strain: Not on file  . Food insecurity    Worry: Not on file    Inability: Not on file  . Transportation needs    Medical: Not on file    Non-medical: Not on file  Tobacco Use  . Smoking status: Former Smoker    Types: Cigarettes    Quit date: 12/10/2002    Years since quitting: 16.8  . Smokeless tobacco: Never Used   Substance and Sexual Activity  . Alcohol use: No  . Drug use: No  . Sexual activity: Not Currently  Lifestyle  . Physical activity    Days per week: Not on file    Minutes per session: Not on file  . Stress: Not on file  Relationships  . Social Herbalist on phone: Not on file    Gets together: Not on file    Attends religious service: Not on file    Active member of club or organization: Not on file    Attends meetings of clubs or organizations: Not on file    Relationship status: Not on file  . Intimate partner violence    Fear of current or ex partner: Not on file    Emotionally abused: Not on file    Physically abused: Not on file    Forced sexual activity: Not on file  Other Topics Concern  . Not on file  Social History Narrative   Pt lives with wife.     Family History:   Family History  Problem Relation Age of Onset  . Aneurysm Mother   . Kidney disease Father   . Heart disease Maternal Uncle   . Heart disease Paternal Uncle   . Aneurysm Maternal Grandmother   . Diabetes Sister      ROS:  Please see the history of present illness.  Review of Systems  All other ROS reviewed and negative.     Physical Exam/Data:   Vitals:   10/13/19 1230 10/13/19 1315 10/13/19 1325 10/13/19 1328  BP: 113/79 124/71    Pulse: 77 77 77 78  Resp: 19 (!) 22  18  Temp:      TempSrc:      SpO2: 92%  96% 97%  Weight:      Height:        Intake/Output Summary (Last 24 hours) at 10/13/2019 1335 Last data filed at 10/13/2019 1101 Gross per 24 hour  Intake -  Output 250 ml  Net -250 ml   Filed Weights   10/13/19 0554  Weight: 62 kg   Body mass index is 19.61 kg/m.  General: Elderly male, well-developed, no acute distress\ HEENT: normal Lymph: no adenopathy Neck: elevated JVD Endocrine:  No thryomegaly Vascular: No carotid bruits; FA pulses 2+ bilaterally without bruits  Cardiac:  normal  S1, S2; irregular rhythm; no murmur, sternotomy scar well healed  Lungs: Bibasilar crackles, no wheezing, rhonchi, normal work of breathing Abd: soft, nontender, no hepatomegaly   Ext: no edema Musculoskeletal:  No deformities, BUE and BLE strength normal and equal Skin: warm and dry  Neuro:  CNs 2-12 intact, no focal abnormalities noted Psych:  Normal affect   EKG:  The EKG was personally reviewed and demonstrates:  Sinus rhythm, 1st degree block, LBBB  Relevant CV Studies:  03/11/2019 Echocardiogram: Study Conclusions  - Left ventricle: The cavity size was normal. Wall thickness was   normal. Systolic function was mildly reduced. The estimated   ejection fraction was in the range of 45% to 50%. Features are   consistent with a pseudonormal left ventricular filling pattern,   with concomitant abnormal relaxation and increased filling   pressure (grade 2 diastolic dysfunction). - Mitral valve: There was mild to moderate regurgitation. - Left atrium: The atrium was moderately dilated. - Tricuspid valve: There was moderate regurgitation. - Pulmonary arteries: Systolic pressure was moderately increased.   PA peak pressure: 42 mm Hg (S).  Laboratory Data:  Chemistry Recent Labs  Lab 10/13/19 0614  NA 142  K 4.9  CL 111  CO2 21*  GLUCOSE 98  BUN 33*  CREATININE 1.53*  CALCIUM 9.2  GFRNONAA 43*  GFRAA 50*  ANIONGAP 10    No results for input(s): PROT, ALBUMIN, AST, ALT, ALKPHOS, BILITOT in the last 168 hours. Hematology Recent Labs  Lab 10/13/19 0614  WBC 5.5  RBC 3.92*  HGB 12.4*  HCT 40.6  MCV 103.6*  MCH 31.6  MCHC 30.5  RDW 18.2*  PLT 183   Cardiac EnzymesNo results for input(s): TROPONINI in the last 168 hours. No results for input(s): TROPIPOC in the last 168 hours.  BNP Recent Labs  Lab 10/13/19 0615  BNP 1,143.3*    DDimer No results for input(s): DDIMER in the last 168 hours.  Radiology/Studies:  Dg Chest Portable 1 View  Result Date: 10/13/2019 CLINICAL DATA:  Shortness of breath EXAM: PORTABLE CHEST 1  VIEW COMPARISON:  February 25, 2019 FINDINGS: Heart size is enlarged. Aortic calcifications are noted. The patient is status post prior median sternotomy. There is a rounded density overlying the right mid lung zone favored to represent a loculated right-sided pleural effusion. Emphysematous changes are noted bilaterally. There are bibasilar airspace opacities which may represent atelectasis or infiltrate. IMPRESSION: 1. Loculated right-sided pleural effusion. There is a masslike appearance of this pleural fluid in the right mid lung zone. This is similar to prior CT from July 2020. 2. Small left-sided pleural effusion. 3. Bibasilar airspace opacities favored to represent atelectasis, however an infiltrate is not excluded. Electronically Signed   By: Constance Holster M.D.   On: 10/13/2019 06:23    Assessment and Plan:   1. Acute on chronic heart failure exacerbation: Patient has a history of AAA s/p EVAR, CAD status post CABG x2, ICM (Echo in 2019 showed EF 45-50%) presenting with a 3-day history of shortness of breath, orthopnea, and PND.  Also reported some decreased appetite.  Denies any chest pain, fevers, chills, headaches, or other symptoms.  Noted to have bibasilar crackles on exam and elevated JVD, no lower extremity edema noted. Weight today was 62 kg, last weight in Jan 2020 was 62.5 kg.. Chest x-ray showed loculated right pleural effusion, small left pleural effusion. BNP elevated to 1143. EKG showed normal sinus rhythm, first-degree AV block, and left bundle branch block, unchanged  from prior. He received 40 mg IV Lasix in the ED.  Unclear cause of the acute exacerbation, no signs of infection or cardiac symptoms. -Follow-up echocardiogram -Agree with Lasix 40 mg twice daily -Monitor I's and O's -Daily weights  2. CAD status post CABG: Currently on ASA, atorvastatin, lisniopril, coreg, and clopidogrel at home. He denies any chest pain today. Agree with holding lisinopril in setting of renal  dysfunction.  3. H/o lung and esophageal cancer with pleural effusion: Patient has shortness of breath and a masslike middle lobe opacity. Pulmonology following, findings concerning for possible primary lung malignancy. Holding plavix for possible biopsy.  4. HTN: BP currently well controlled. On lisinopril, coreg at home.  5. PVD/AAA s/p FEVAR: Planned to have angiogram 10/13/19 for concern about LLE claudication. Ideally would have this done prior to discharge.     For questions or updates, please contact [place] Please consult [email or web address] for contact info under Cardiology/STEMI.   Signed, Asencion Noble, MD  10/13/2019 1:35 PM

## 2019-10-13 NOTE — ED Notes (Signed)
Pt ambulated around the room. Pulse ox dropped to 89%. Upon rest went immediately up to 95%

## 2019-10-13 NOTE — Progress Notes (Signed)
  Echocardiogram 2D Echocardiogram has been performed.  G[place] Green 10/13/2019, 12:07 PM

## 2019-10-13 NOTE — H&P (Addendum)
History and Physical    [name] NFA:[id number] DOB: [birth year] DOA: 10/13/2019  PCP: Alroy Dust, L.Marlou Sa, MD Consultants:  Trula Slade - vascular; [place] - pulmonology; Marlou Porch - cardiology Patient coming from:  Home - lives with his wife and her sister; Cecile Prose: Wife, [phone]  Chief Complaint: SOB  HPI: [place] is a 78 y.o. male with medical history significant of CVA (1995); SBO; afib; hypothyroidism; lung and esophageal cancer; HLD; CAD; and AAA s/p FEVAR in 3/20 with LLE claudication with scheduled angiography for today presenting with SOB.  He was supposed for an angiogram this AM due to a blockage in his leg.  This AM, he complained of SOB.  He had significant orthopnea.  He has had heart problems (CAD) but doesn't know if he has had heart failure.  No chest pain.  +DOE.  Symptoms started maybe 3 days ago, but he didn't tell his wife.   ED Course:   CHF exacerbation.  Scheduled for vascular angiogram today.  Dr. Trula Slade is aware of admission.  +SOB, DOE, orthopnea, CXR with loculated effusion.   Review of Systems: As per HPI; otherwise review of systems reviewed and negative.   Ambulatory Status:  Ambulates without assistance  Past Medical History:  Diagnosis Date  . AAA (abdominal aortic aneurysm) ([place]) 08/22/2012   3.9 cm by CT  - June 2013, stable   . 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 Atrial fibrillation ([place])   . CAD (coronary artery disease)   . Cancer ([place])    Esophageal, adrenal gland, skin; lung  . Carotid artery occlusion   . Cerebral aneurysm    [place]. repair  1994  . Chronic combined systolic (congestive) and diastolic (congestive) heart failure ([place])   . Dyslipidemia   . History of esophageal cancer    s/p transhiatal esophagogastrectomy  . History of lung cancer   . Hypothyroidism   . Pneumonia   . Postoperative atrial fibrillation ([place]) 12/04/2013   Short course of amiodarone, resolved. Postop bypass  . SBO (small bowel  obstruction) ([place]) 08/15/2012   History of small bowel obstruction (status post small bowel       resection, lysis of adhesions, incidental appendectomy, and repair       of left diaphragmatic hernia  . Stroke [facility]) 1995   denies residual    Past Surgical History:  Procedure Laterality Date  . ABDOMINAL AORTIC ENDOVASCULAR FENESTRATED STENT GRAFT N/A 02/25/2019   Procedure: ABDOMINAL AORTIC ENDOVASCULAR FENESTRATED STENT GRAFT;  Surgeon: Serafina Mitchell, MD;  Location: [place];  Service: Vascular;  Laterality: N/A;  . BRAIN SURGERY     brain aneursyn  . CATARACT EXTRACTION W/ INTRAOCULAR LENS  IMPLANT, BILATERAL    . CHOLECYSTECTOMY    . CORONARY ARTERY BYPASS GRAFT N/A 07/30/2013   Procedure: CORONARY ARTERY BYPASS GRAFTING (CABG) times two on pump using left internal mammary artery and left greater saphenous vein via endovein harvest.;  Surgeon: Gaye Pollack, MD;  Location: [facility] OR;  Service: Open Heart Surgery;  Laterality: N/A;  . Exploratory laparotomy, lysis of adhesions, reduce of incarcerated small bowel and colon from the chest, limited small bowel resection, incidental appendectomy, and then repair of diaphragmatic hernia with AlloDerm mesh  10/27/2009   [place]  . Exploratory laparotomy,exploratory thoracotomy for hemorrhage  02/11/2003   [place]  . LEFT HEART CATHETERIZATION WITH CORONARY ANGIOGRAM N/A 07/30/2013   Procedure: LEFT HEART CATHETERIZATION WITH CORONARY ANGIOGRAM;  Surgeon: Peter M Martinique, MD;  Location:  [place] CATH LAB;  Service: Cardiovascular;  Laterality: N/A;  . Left subclavian Port- A-Cath insertion  03/09/2004   [place]  . left upper lobectomy with node dissection  02/24/2010   [place]  . TONSILLECTOMY    . Transhiatal esophagectomy with cholecystectomy, jejunostomy,  pyloroplasty and removal of right subclavian Port-A- Cath     [place]    Social History   Socioeconomic History  . Marital status: Married    Spouse name: Not on file  . Number of children: Not on  file  . Years of education: Not on file  . Highest education level: Not on file  Occupational History  . Occupation: retired  Scientific laboratory technician  . Financial resource strain: Not on file  . Food insecurity    Worry: Not on file    Inability: Not on file  . Transportation needs    Medical: Not on file    Non-medical: Not on file  Tobacco Use  . Smoking status: Former Smoker    Types: Cigarettes    Quit date: 12/10/2002    Years since quitting: 16.8  . Smokeless tobacco: Never Used  Substance and Sexual Activity  . Alcohol use: No  . Drug use: No  . Sexual activity: Not Currently  Lifestyle  . Physical activity    Days per week: Not on file    Minutes per session: Not on file  . Stress: Not on file  Relationships  . Social Herbalist on phone: Not on file    Gets together: Not on file    Attends religious service: Not on file    Active member of club or organization: Not on file    Attends meetings of clubs or organizations: Not on file    Relationship status: Not on file  . Intimate partner violence    Fear of current or ex partner: Not on file    Emotionally abused: Not on file    Physically abused: Not on file    Forced sexual activity: Not on file  Other Topics Concern  . Not on file  Social History Narrative   Pt lives with wife.     Allergies  Allergen Reactions  . Quinolones Other (See Comments)    Patient was warned about not using Cipro and similar antibiotics. Recent studies have raised concern that fluoroquinolone antibiotics could be associated with an increased risk of aortic aneurysm Fluoroquinolones have non-antimicrobial properties that might jeopardise the integrity of the extracellular matrix of the vascular wall In a  propensity score matched cohort study in [place], there was a 66% increased rate of aortic aneurysm or dissection associated with oral fluoroquinolone use, compared wit    Family History  Problem Relation Age of Onset  .  Aneurysm Mother   . Kidney disease Father   . Heart disease Maternal Uncle   . Heart disease Paternal Uncle   . Aneurysm Maternal Grandmother   . Diabetes Sister     Prior to Admission medications   Medication Sig Start Date End Date Taking? Authorizing Provider  acetaminophen (TYLENOL) 500 MG tablet Take 1,000 mg by mouth every 6 (six) hours as needed for mild pain or headache.    [provider]  aspirin 81 MG tablet Take 1 tablet (81 mg total) by mouth daily. 12/04/13   Jerline Pain, MD  atorvastatin (LIPITOR) 40 MG tablet TAKE 1 TABLET BY MOUTH DAILY AT 6 PM Patient taking differently: Take 40 mg by mouth daily at  6 PM.  03/09/19   Jerline Pain, MD  carvedilol (COREG) 12.5 MG tablet TAKE 1 TABLET (12.5 MG TOTAL) BY MOUTH 2 (TWO) TIMES DAILY Patient taking differently: Take 12.5 mg by mouth. TAKE 1 TABLET (12.5 MG TOTAL) BY MOUTH 2 (TWO) TIMES DAILY 03/09/19   Jerline Pain, MD  clopidogrel (PLAVIX) 75 MG tablet TAKE 1 TABLET BY MOUTH EVERY DAY Patient taking differently: Take 75 mg by mouth daily.  08/16/19   Serafina Mitchell, MD  furosemide (LASIX) 20 MG tablet Take 1 tablet (20 mg total) by mouth daily. Please make yearly appt with Dr. Marlou Porch for January for future refills. 1st attempt Patient taking differently: Take 20 mg by mouth daily.  09/03/19   Jerline Pain, MD  guaiFENesin (MUCINEX) 600 MG 12 hr tablet Take 600 mg by mouth 2 (two) times daily as needed for to loosen phlegm.     [provider]  lisinopril (ZESTRIL) 5 MG tablet TAKE 1 TABLET BY MOUTH EVERY DAY Patient taking differently: Take 5 mg by mouth daily.  06/04/19   Jerline Pain, MD  ondansetron (ZOFRAN ODT) 4 MG disintegrating tablet Take 1 tablet (4 mg total) by mouth every 8 (eight) hours as needed for nausea or vomiting. 02/28/19   Serafina Mitchell, MD  STIOLTO RESPIMAT 2.5-2.5 MCG/ACT AERS INHALE 2 PUFFS BY MOUTH INTO THE LUNGS DAILY Patient taking differently: Inhale 2 puffs into the lungs  daily.  06/15/19   Collene Gobble, MD    Physical Exam: Vitals:   10/13/19 0926 10/13/19 0930 10/13/19 1015 10/13/19 1100  BP: (!) 137/96 126/88 118/75 114/80  Pulse: (!) 103 100 81 79  Resp: (!) 23 (!) 29 20 17   Temp:      TempSrc:      SpO2: 94% 95% 100% 98%  Weight:      Height:         . General:  Appears calm and comfortable and is NAD, quiet . Eyes:  PERRL, EOMI, normal lids, iris . ENT:  grossly normal hearing, lips & tongue, mmm . Neck:  no LAD, masses or thyromegaly . Cardiovascular:  RRR at times and other times appeared to be irregularly irregular, no m/r/g. 1-2+ ankle edema.  Marland Kitchen Respiratory:   Diminished breath sounds in R > LLL.  Mildly increased respiratory effort. . Abdomen:  soft, NT, ND, NABS . Back:   normal alignment, no CVAT . Skin:  no rash or induration seen on limited exam . Musculoskeletal:  grossly normal tone BUE/BLE, good ROM, no bony abnormality . Psychiatric:  blunted mood and affect, speech fluent and appropriate, AOx3 . Neurologic:  CN 2-12 grossly intact, moves all extremities in coordinated fashion, sensation intact    Radiological Exams on Admission: Dg Chest Portable 1 View  Result Date: 10/13/2019 CLINICAL DATA:  Shortness of breath EXAM: PORTABLE CHEST 1 VIEW COMPARISON:  February 25, 2019 FINDINGS: Heart size is enlarged. Aortic calcifications are noted. The patient is status post prior median sternotomy. There is a rounded density overlying the right mid lung zone favored to represent a loculated right-sided pleural effusion. Emphysematous changes are noted bilaterally. There are bibasilar airspace opacities which may represent atelectasis or infiltrate. IMPRESSION: 1. Loculated right-sided pleural effusion. There is a masslike appearance of this pleural fluid in the right mid lung zone. This is similar to prior CT from July 2020. 2. Small left-sided pleural effusion. 3. Bibasilar airspace opacities favored to represent atelectasis, however an  infiltrate is  not excluded. Electronically Signed   By: Constance Holster M.D.   On: 10/13/2019 06:23    EKG: Independently reviewed.  NSR with rate 89; LBBB with NSCSLT  Labs on Admission: I have personally reviewed the available labs and imaging studies at the time of the admission.  Pertinent labs:   CO2 21 BUN 33/Creatinine 1.53/GFR 43 BNP 1143.3 HS troponin 19, 21 Lactate 2.0, 1.1 Unremarkable CBC COVID negative Blood cultures pending    Assessment/Plan Principal Problem:   Acute on chronic combined systolic and diastolic CHF (congestive heart failure) (HCC) Active Problems:   Dyslipidemia   History of esophageal cancer   Hypothyroidism   History of lung cancer   AAA (abdominal aortic aneurysm) (HCC)   Essential hypertension, benign   Pleural effusion    Acute on chronic CHF exacerbation -Patient presenting with acute and worsening SOB x 3 days; +orthopnea, DOE, PND -CXR with loculated R-sided pleural effusion but also possibly consistent with pulmonary edema -Normal WBC count -Elevated BNP -EKG with NSR but he did appear to have transient afib while I was evaluating him -Will admit, as per the Emergency HF Mortality Risk Grade.   -Will request echocardiogram - prior echo was in 4/19 and showed EF 45-50% and grade 2 diastolic dysfunction -Will continue ASA and Plavix -Will hold Lisinopril due to renal dysfunction; however, this appears to be chronic and so it may be reasonable to resume post-diuresis if renal function continues to be stable -Continue Coreg -CHF order set utilized -Was given Lasix 40 mg x 1 in ER and will repeat with 40 mg IV BID -Continue prn [place] O2 for now -Repeat EKG in AM -Mild HS troponin elevation although doubt ACS based on symptoms -Cardiology consultation requested  H/o lung and esophageal cancer with pleural effusion -He has a h/o multiple different cancers including esophageal (s/p esophagectomy and chemo/rads in 2004 for poorly  differentiated adenocarcinoma); R adrenal gland removal (metastatic disease); and LUL lobectomy ([facility] in 2011) -He appears to have had a long-standing right-sided loculated pleural effusion since at least 2014 -CT-guided needle biopsy remotely showed no evidence of recurrent malignancy -CT on 7/9 again showed this effusion as well as a "pseudo mass appearance" -CXR today appears to indicate ongoing effusion similar to CT in July -Pulm consulted requested to assess whether thoracentesis and/or VATS is reasonable at this time -It is not clear whether this issue is actively contributing to symptoms at this time  HTN -Continue Coreg -Hold Lisinopril  HLD -Continue Lipitor -Lipids were last checked in 2014; will recheck.  PVD, including h/o AAA s/p repair -Prior AAA repair in 3/20 -Currently with concern for LLE claudication -Plan was for angiography today -This has been deferred for now -Ideally, angiogram could take place later this week prior to d/c    Note: This patient has been tested and is negative for the novel coronavirus COVID-19.    DVT prophylaxis: Lovenox  Code Status:  DNR - confirmed with patient/wife Family Communication: Wife was present throughout evaluation Disposition Plan:  Home once clinically improved Consults called: Pulmonology; Cardiology; Vascular (notified by telephone by EDP); CM/PT  Admission status: Admit - It is my clinical opinion that admission to INPATIENT is reasonable and necessary because this patient will require at least 2 midnights in the hospital to treat this condition based on the medical complexity of the problems presented.  Given the aforementioned information, the predictability of an adverse outcome is felt to be significant.    Karmen Bongo MD [facility]   How to contact the [facility] Attending or Consulting provider [place] or covering provider during after hours [place], for this patient?  1. Check the care team in [facility] and  look for a) attending/consulting [facility] provider listed and b) the [facility] team listed 2. Log into [email or web address] and use [place]'s universal password to access. If you do not have the password, please contact the hospital operator. 3. Locate the [facility] provider you are looking for under [facility] and [place] to a number that you can be directly reached. 4. If you still have difficulty reaching the provider, please [place] the [facility] (Director on Call) for the Hospitalists listed on amion for assistance.   10/13/2019, 11:32 AM

## 2019-10-13 NOTE — ED Notes (Signed)
Pt sleeping on side, first bp reading 82 systolic, bp rechecked; pt comfortable

## 2019-10-13 NOTE — ED Provider Notes (Signed)
[facility] EMERGENCY DEPARTMENT Provider Note   CSN: [id number] Arrival date & time: 10/13/19  0541     History   Chief Complaint Chief Complaint  Patient presents with   Shortness of Breath    HPI Glen Green is a 78 y.o. male.     HPI  This is a 78 year old male with a history of lung cancer, atrial fibrillation, coronary artery disease, AAA, peripheral vascular disease who presents with shortness of breath.  Patient reports over the last 24 hours she has had increasing shortness of breath.  He describes worsening of shortness of breath when he lays flat.  He states he did not feel like he can take a deep breath.  He feels he has had some lower extremity edema.  No known weight gain.  Denies chest pain.  Denies any recent fevers or cough.  Patient is scheduled later today for lower extremity angiography for claudication and peripheral vascular disease.  He was swabbed for Covid on Friday and was negative.  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 repair  1994   Dyslipidemia    History of esophageal cancer    s/p transhiatal esophagogastrectomy   History of lung cancer    Hypothyroidism    Pneumonia    Postoperative atrial fibrillation ([place]) 12/04/2013   Short course of amiodarone, resolved. Postop bypass   SBO (small bowel obstruction) ([place]) 08/15/2012   History of small bowel obstruction (status post small bowel       resection, lysis of adhesions, incidental appendectomy, and repair       of left diaphragmatic hernia   Stroke [facility]) 1995   denies residual    Patient Active Problem List   Diagnosis Date Noted   Status  post aortic coarctation stent placement 02/25/2019   S/P AAA repair 02/25/2019   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[place]) 04/10/2018   Pleural effusion 04/02/2018   Pulmonary emphysema ([place])    Acute respiratory failure with hypoxia ([place]) 03/08/2018   Acute on chronic clinical systolic heart failure ([place]) 09/13/2014   Aneurysm of abdominal vessel ([place]) 08/23/2014   Cardiomyopathy, ischemic 06/16/2014   Essential hypertension, benign 06/16/2014   Pure hypercholesterolemia 06/16/2014   Postoperative atrial fibrillation ([place]) 12/04/2013   Coronary atherosclerosis of native coronary artery 09/09/2013   NSTEMI (non-ST elevated myocardial infarction) ([place]) 07/30/2013   AAA (abdominal aortic aneurysm) ([place]) 08/22/2012   Bilateral hearing loss 08/22/2012   PVD (peripheral vascular disease) ([place]) 08/22/2012   Preventative health care 08/15/2012   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   SBO (small bowel obstruction) ([place]) 08/15/2012   Hypokalemia 12/09/2011   Weakness generalized 12/05/2011   Protein-calorie malnutrition, moderate ([place]) 12/05/2011   Hyponatremia 12/05/2011   Dyslipidemia    History of esophageal cancer    Hypothyroidism    Cerebral aneurysm    History of lung cancer     Past Surgical History:  Procedure Laterality Date   ABDOMINAL AORTIC ENDOVASCULAR FENESTRATED STENT GRAFT N/A 02/25/2019   Procedure: ABDOMINAL AORTIC ENDOVASCULAR FENESTRATED STENT GRAFT;  Surgeon: Serafina Mitchell, MD;  Location: [facility];  Service: Vascular;  Laterality: N/A;   BRAIN SURGERY     brain aneursyn   CATARACT EXTRACTION W/ INTRAOCULAR LENS  IMPLANT, BILATERAL     CHOLECYSTECTOMY     CORONARY ARTERY BYPASS GRAFT N/A 07/30/2013   Procedure: CORONARY ARTERY BYPASS GRAFTING (CABG) times two on pump using left internal mammary artery and left greater saphenous vein via endovein harvest.;  Surgeon: Gaye Pollack, MD;  Location: [facility];  Service: Open Heart Surgery;   Laterality: N/A;   Exploratory laparotomy, lysis of adhesions, reduce of incarcerated small bowel and colon from the chest, limited small bowel resection, incidental appendectomy, and then repair of diaphragmatic hernia with AlloDerm mesh  10/27/2009   [place]   Exploratory laparotomy,exploratory thoracotomy for hemorrhage  02/11/2003   [facility]   LEFT HEART CATHETERIZATION WITH CORONARY ANGIOGRAM N/A 07/30/2013   Procedure: LEFT HEART CATHETERIZATION WITH CORONARY ANGIOGRAM;  Surgeon: Peter M Martinique, MD;  Location: [facility] CATH LAB;  Service: Cardiovascular;  Laterality: N/A;   Left subclavian Port- A-Cath insertion  03/09/2004   [place]   left upper lobectomy with node dissection  02/24/2010   [place]   TONSILLECTOMY     Transhiatal esophagectomy with cholecystectomy, jejunostomy,  pyloroplasty and removal of right subclavian Port-A- Cath     [place]        Home Medications    Prior to Admission medications   Medication Sig Start Date End Date Taking? Authorizing Provider  acetaminophen (TYLENOL) 500 MG tablet Take 1,000 mg by mouth every 6 (six) hours as needed for mild pain or headache.    [provider]  aspirin 81 MG tablet Take 1 tablet (81 mg total) by mouth daily. 12/04/13   Jerline Pain, MD  atorvastatin (LIPITOR) 40 MG tablet TAKE 1 TABLET BY MOUTH DAILY AT 6 PM Patient taking differently: Take 40 mg by mouth daily at 6 PM.  03/09/19   Jerline Pain, MD  carvedilol (COREG) 12.5 MG tablet TAKE 1 TABLET (12.5 MG TOTAL) BY MOUTH 2 (TWO) TIMES DAILY Patient taking differently: Take 12.5 mg by mouth. TAKE 1 TABLET (12.5 MG TOTAL) BY MOUTH 2 (TWO) TIMES DAILY 03/09/19   Jerline Pain, MD  clopidogrel (PLAVIX) 75 MG tablet TAKE 1 TABLET BY MOUTH EVERY DAY Patient taking differently: Take 75 mg by mouth daily.  08/16/19   Serafina Mitchell, MD  furosemide (LASIX) 20 MG tablet Take 1 tablet (20 mg total) by mouth daily. Please make yearly appt with Dr. Marlou Porch for January for  future refills. 1st attempt Patient taking differently: Take 20 mg by mouth daily.  09/03/19   Jerline Pain, MD  guaiFENesin (MUCINEX) 600 MG 12 hr tablet Take 600 mg by mouth 2 (two) times daily as needed for to loosen phlegm.     [provider]  lisinopril (ZESTRIL) 5 MG tablet TAKE 1 TABLET BY MOUTH EVERY DAY Patient taking differently: Take 5 mg by mouth daily.  06/04/19   Jerline Pain, MD  ondansetron (ZOFRAN ODT) 4 MG disintegrating tablet Take 1 tablet (4 mg total) by mouth every 8 (eight) hours as needed for nausea or vomiting. 02/28/19   Serafina Mitchell, MD  STIOLTO RESPIMAT 2.5-2.5 MCG/ACT AERS INHALE 2 PUFFS BY MOUTH INTO THE LUNGS DAILY Patient taking differently: Inhale 2 puffs into the lungs daily.  06/15/19   Collene Gobble, MD    Family History Family History  Problem Relation Age of Onset   Aneurysm Mother    Kidney  disease Father    Heart disease Maternal Uncle    Heart disease Paternal Uncle    Aneurysm Maternal Grandmother    Diabetes Sister     Social History Social History   Tobacco Use   Smoking status: Former Smoker    Types: Cigarettes    Quit date: 12/10/2002    Years since quitting: 16.8   Smokeless tobacco: Never Used  Substance Use Topics   Alcohol use: No   Drug use: No     Allergies   Quinolones   Review of Systems Review of Systems  Constitutional: Negative for fever.  Respiratory: Positive for shortness of breath. Negative for cough.   Cardiovascular: Positive for leg swelling. Negative for chest pain.  Gastrointestinal: Negative for abdominal pain, nausea and vomiting.  Genitourinary: Negative for dysuria.  Neurological: Negative for light-headedness.  All other systems reviewed and are negative.    Physical Exam Updated Vital Signs BP 119/75    Pulse 82    Temp 97.9 F (36.6 C) (Rectal)    Resp (!) 35    Ht 1.778 m (5\' 10" )    Wt 62 kg    SpO2 96%    BMI 19.61 kg/m   Physical Exam Vitals signs and nursing  note reviewed.  Constitutional:      Appearance: He is well-developed.     Comments: Slightly tachypneic, nontoxic  HENT:     Head: Normocephalic and atraumatic.  Eyes:     Pupils: Pupils are equal, round, and reactive to light.  Neck:     Musculoskeletal: Neck supple.     Vascular: No JVD.  Cardiovascular:     Rate and Rhythm: Normal rate and regular rhythm.     Heart sounds: Normal heart sounds. No murmur.  Pulmonary:     Effort: Pulmonary effort is normal. Tachypnea present. No respiratory distress.     Breath sounds: Normal breath sounds. No wheezing.     Comments: Speaking in short sentences, no overt respiratory distress, splinting respirations, scarring noted bilateral upper chest Abdominal:     General: Bowel sounds are normal.     Palpations: Abdomen is soft.     Tenderness: There is no abdominal tenderness. There is no rebound.  Musculoskeletal:     Comments: Minimal to trace lower extremity edema bilaterally  Lymphadenopathy:     Cervical: No cervical adenopathy.  Skin:    General: Skin is warm and dry.  Neurological:     Mental Status: He is alert and oriented to person, place, and time.  Psychiatric:        Mood and Affect: Mood normal.      ED Treatments / Results  Labs (all labs ordered are listed, but only abnormal results are displayed) Labs Reviewed  CBC WITH DIFFERENTIAL/PLATELET - Abnormal; Notable for the following components:      Result Value   RBC 3.92 (*)    Hemoglobin 12.4 (*)    MCV 103.6 (*)    RDW 18.2 (*)    All other components within normal limits  CULTURE, BLOOD (ROUTINE X 2)  CULTURE, BLOOD (ROUTINE X 2)  SARS CORONAVIRUS 2 BY RT PCR (HOSPITAL ORDER, [facility])  BASIC METABOLIC PANEL  BRAIN NATRIURETIC PEPTIDE  LACTIC ACID, PLASMA  LACTIC ACID, PLASMA  TROPONIN I (HIGH SENSITIVITY)    EKG None  Radiology Dg Chest Portable 1 View  Result Date: 10/13/2019 CLINICAL DATA:  Shortness of breath  EXAM: PORTABLE CHEST 1 VIEW COMPARISON:  February 25, 2019 FINDINGS: Heart size is enlarged. Aortic calcifications are noted. The patient is status post prior median sternotomy. There is a rounded density overlying the right mid lung zone favored to represent a loculated right-sided pleural effusion. Emphysematous changes are noted bilaterally. There are bibasilar airspace opacities which may represent atelectasis or infiltrate. IMPRESSION: 1. Loculated right-sided pleural effusion. There is a masslike appearance of this pleural fluid in the right mid lung zone. This is similar to prior CT from July 2020. 2. Small left-sided pleural effusion. 3. Bibasilar airspace opacities favored to represent atelectasis, however an infiltrate is not excluded. Electronically Signed   By: Constance Holster M.D.   On: 10/13/2019 06:23    Procedures Procedures (including critical care time)  Medications Ordered in ED Medications - No data to display   Initial Impression / Assessment and Plan / ED Course  I have reviewed the triage vital signs and the nursing notes.  Pertinent labs & imaging results that were available during my care of the patient were reviewed by me and considered in my medical decision making (see chart for details).     Patient presents with shortness of breath.  Scheduled for procedure later today.  He is mildly tachypneic but otherwise nontoxic.  O2 sats 96%.  His initial chest x-ray showed a loculated right-sided pleural effusion.  He denies any infectious symptoms although this effusion on my evaluation appears larger than last chest x-ray.  Work-up initiated.  I did reswab him for Covid given his new onset of symptoms and need for procedure later today.  Patient will be signed out to oncoming provider.  Final Clinical Impressions(s) / ED Diagnoses   Final diagnoses:  None    ED Discharge Orders    None       Ayven Glasco, Barbette Hair, MD 10/13/19 [id number]

## 2019-10-13 NOTE — Consult Note (Addendum)
NAME:  Glen Green, MRN:  [id number], DOB:  Jan 20, 1941, LOS: 0 ADMISSION DATE:  10/13/2019, CONSULTATION DATE:  10/13/19 REFERRING MD:  Lorin Mercy - TRH, CHIEF COMPLAINT:  SOB  Brief History   78 yo M presenting with SOB noted to have loculated appearing R sided effusion and small L sided effusion.   History of present illness   78 yo M PMH  lung and esophageal Cancer s/p esophagectomy and L upper lobectomy,  AAA s/p FEVAR, CVA, Afib, CAD, emphysema (followed by Dr. Lamonte Sakai PCCM) who presents 11/3 for SOB. Patient initially scheduled for angiogram 11/3 LLE claudication with identified popliteal lesion. Patient with worsening SOB this AM and instead presents to ED. Symptoms began 3 days ago and have progressed. Denies chest pain, endorses DOE, significant orthopnea. LLE cramping pain, no significant change from baseline. No new BLE edema or flank edema. Patient endorses non-compliance with plavix at home due to bleeding. Last plavix dose 11/1 vs 11/2 per patient's wife.   SARS COV2 negative   BNP 1143 in ED hsTroponin 21 Cr 1.53, K 4.9,  WBC 5.5 Lactic acid 2.0  H/H 12.4/40.6, plt 183   Past Medical History  Lung cancer esophageal cancer AAA s/p FEVAR CAD HLD HTN A fib  CVA  Significant Hospital Events   11/3 admitted to hospitalist. PCCM consulted for possible thoracentesis in setting of loculated R sided effusion  Consults:  PCCM  Procedures:    Significant Diagnostic Tests:  11/3 CXR > enlarged cardiac silhouette. S/p sternotomy. Emphysematous changes. Bilateral ASD -- infiltrate vs atelectasis. RML density, likely loculated effusion. Small L sided effusion   Micro Data:  11/3 SARS CoV2> neg  11/3 RVP >>>  Antimicrobials:   Interim history/subjective:  Has received ativan for anxiety in ED  Is calm at time on exam and on [facility] [place] with SpO2 100% O2 weaned off with resultant SpO2 96%.  ECHO tech is arriving to perform ECHO, O2 increased to [facility] with SpO2 100% for patient's  anxiety   Objective   Blood pressure 126/88, pulse 100, temperature 97.9 F (36.6 C), temperature source Rectal, resp. rate (!) 29, height 5\' 10"  (1.778 m), weight 62 kg, SpO2 95 %.       No intake or output data in the 24 hours ending 10/13/19 1049 Filed Weights   10/13/19 0554  Weight: 62 kg    Examination: General: Chronically ill, frail appearing older adult M, reclined in bed NAD HENT: NCAT. Pink mm. Trachea midline. Anicteric sclera Lungs: Diminished bibasilar sounds. Symmetrical chest expansion, no accessory muscle use Cardiovascular: RRR s1s2. Cap refill < 3 seconds. Old, healed midline sternotomy scar Abdomen: soft flat ndnt + bowel sounds  Extremities: LLE tender to touch (crampy). No edema, no cyanosis or clubbing  Neuro: Awake calm. Oriented x2. Following commands  GU: defer  Resolved Hospital Problem list     Assessment & Plan:   R sided Loculated Effusion  -mass-like appearance-- vs rounded atelectasis Small L sided effusion  P PCCM MD to evaluate for possible R sided thoracentesis with ctyology Will dc plavix in interim (last given 11/1 or 11/2 -- patient and wife are unsure however both confirm has not been given plavix 11/3)  Supplemental O2 as needed for SpO2 > 92%  Hx lung cancer, esophageal cancer P If able to perform thora, send cytology   Emphysema -followed by Dr. Lamonte Sakai -stiolto qD, PRN albuterol  P Combivent qD, PRN albuterol   Rest per primary    Best practice:  Diet: NPO  Pain/Anxiety/Delirium protocol (if indicated): APAP  VAP protocol (if indicated): na DVT prophylaxis: Lovenox GI prophylaxis: na Glucose control: na Mobility: PT eval Code Status: DNR  Family Communication: Patient and wife updated at bedside  Disposition: Will be admitted to cardiac tele   Labs   CBC: Recent Labs  Lab 10/13/19 0614  WBC 5.5  NEUTROABS 3.5  HGB 12.4*  HCT 40.6  MCV 103.6*  PLT 737    Basic Metabolic Panel: Recent Labs  Lab 10/13/19  0614  NA 142  K 4.9  CL 111  CO2 21*  GLUCOSE 98  BUN 33*  CREATININE 1.53*  CALCIUM 9.2   GFR: Estimated Creatinine Clearance: 35.5 mL/min (A) (by C-G formula based on SCr of 1.53 mg/dL (H)). Recent Labs  Lab 10/13/19 0614 10/13/19 0615 10/13/19 0817  WBC 5.5  --   --   LATICACIDVEN  --  2.0* 1.1    Liver Function Tests: No results for input(s): AST, ALT, ALKPHOS, BILITOT, PROT, ALBUMIN in the last 168 hours. No results for input(s): LIPASE, AMYLASE in the last 168 hours. No results for input(s): AMMONIA in the last 168 hours.  ABG    Component Value Date/Time   PHART 7.307 (L) 02/25/2019 1524   PCO2ART 41.1 02/25/2019 1524   PO2ART 305.0 (H) 02/25/2019 1524   HCO3 20.6 02/25/2019 1524   TCO2 22 02/25/2019 1524   ACIDBASEDEF 5.0 (H) 02/25/2019 1524   O2SAT 100.0 02/25/2019 1524     Coagulation Profile: No results for input(s): INR, PROTIME in the last 168 hours.  Cardiac Enzymes: No results for input(s): CKTOTAL, CKMB, CKMBINDEX, TROPONINI in the last 168 hours.  HbA1C: Hgb A1c MFr Bld  Date/Time Value Ref Range Status  07/30/2013 05:40 PM 6.5 (H) <5.7 % Final    Comment:    (NOTE)                                                                       According to the ADA Clinical Practice Recommendations for 2011, when HbA1c is used as a screening test:  >=6.5%   Diagnostic of Diabetes Mellitus           (if abnormal result is confirmed) 5.7-6.4%   Increased risk of developing Diabetes Mellitus References:Diagnosis and Classification of Diabetes Mellitus,Diabetes [id number],6948,54(OEVOJ 1):S62-S69 and Standards of Medical Care in         Diabetes - 2011,Diabetes JKKX,3818,29 (Suppl 1):S11-S61.  06/08/2009 06:05 PM (H) 4.6 - 6.1 % Final   6.2 (NOTE) The ADA recommends the following therapeutic goal for glycemic control related to Hgb A1c measurement: Goal of therapy: <6.5 Hgb A1c  Reference: American Diabetes Association: Clinical Practice Recommendations  2010, Diabetes Care, 2010, 33: (Suppl  1).    CBG: No results for input(s): GLUCAP in the last 168 hours.  Review of Systems:   As per HPI  Past Medical History  He,  has a past medical history of AAA (abdominal aortic aneurysm) ([place]) (08/22/2012), Adrenal tumor (08/15/2012), Atrial fibrillation ([place]), CAD (coronary artery disease), Cancer ([place]), Carotid artery occlusion, Cerebral aneurysm, Chronic combined systolic (congestive) and diastolic (congestive) heart failure ([place]), Dyslipidemia, History of esophageal cancer, History of lung cancer, Hypothyroidism, Pneumonia, Postoperative atrial fibrillation ([place]) (12/04/2013), SBO (  small bowel obstruction) ([place]) (08/15/2012), and Stroke ([place]) (1995).   Surgical History    Past Surgical History:  Procedure Laterality Date  . ABDOMINAL AORTIC ENDOVASCULAR FENESTRATED STENT GRAFT N/A 02/25/2019   Procedure: ABDOMINAL AORTIC ENDOVASCULAR FENESTRATED STENT GRAFT;  Surgeon: Serafina Mitchell, MD;  Location: [facility];  Service: Vascular;  Laterality: N/A;  . BRAIN SURGERY     brain aneursyn  . CATARACT EXTRACTION W/ INTRAOCULAR LENS  IMPLANT, BILATERAL    . CHOLECYSTECTOMY    . CORONARY ARTERY BYPASS GRAFT N/A 07/30/2013   Procedure: CORONARY ARTERY BYPASS GRAFTING (CABG) times two on pump using left internal mammary artery and left greater saphenous vein via endovein harvest.;  Surgeon: Gaye Pollack, MD;  Location: [facility] OR;  Service: Open Heart Surgery;  Laterality: N/A;  . Exploratory laparotomy, lysis of adhesions, reduce of incarcerated small bowel and colon from the chest, limited small bowel resection, incidental appendectomy, and then repair of diaphragmatic hernia with AlloDerm mesh  10/27/2009   [place]  . Exploratory laparotomy,exploratory thoracotomy for hemorrhage  02/11/2003   [place]  . LEFT HEART CATHETERIZATION WITH CORONARY ANGIOGRAM N/A 07/30/2013   Procedure: LEFT HEART CATHETERIZATION WITH CORONARY ANGIOGRAM;  Surgeon: Peter M Martinique, MD;   Location[facility] C[facility] LAB;  Service: Cardiovascular;  Laterality: N/A;  . Left subclavian Port- A-Cath insertion  03/09/2004   [place]  . left upper lobectomy with node dissection  02/24/2010   [place]  . TONSILLECTOMY    . Transhiatal esophagectomy with cholecystectomy, jejunostomy,  pyloroplasty and removal of right subclavian Port-A- Cath     [place]     Social History   reports that he quit smoking about 16 years ago. His smoking use included cigarettes. He has never used smokeless tobacco. He reports that he does not drink alcohol or use drugs.   Family History   His family history includes Aneurysm in his maternal grandmother and mother; Diabetes in his sister; Heart disease in his maternal uncle and paternal uncle; Kidney disease in his father.   Allergies Allergies  Allergen Reactions  . Quinolones Other (See Comments)    Patient was warned about not using Cipro and similar antibiotics. Recent studies have raised concern that fluoroquinolone antibiotics could be associated with an increased risk of aortic aneurysm Fluoroquinolones have non-antimicrobial properties that might jeopardise the integrity of the extracellular matrix of the vascular wall In a  propensity score matched cohort study in [place], there was a 66% increased rate of aortic aneurysm or dissection associated with oral fluoroquinolone use, compared wit     Home Medications  Prior to Admission medications   Medication Sig Start Date End Date Taking? Authorizing Provider  acetaminophen (TYLENOL) 500 MG tablet Take 1,000 mg by mouth every 6 (six) hours as needed for mild pain or headache.    [provider]  aspirin 81 MG tablet Take 1 tablet (81 mg total) by mouth daily. 12/04/13   Jerline Pain, MD  atorvastatin (LIPITOR) 40 MG tablet TAKE 1 TABLET BY MOUTH DAILY AT 6 PM Patient taking differently: Take 40 mg by mouth daily at 6 PM.  03/09/19   Jerline Pain, MD  carvedilol (COREG) 12.5 MG tablet TAKE 1 TABLET  (12.5 MG TOTAL) BY MOUTH 2 (TWO) TIMES DAILY Patient taking differently: Take 12.5 mg by mouth. TAKE 1 TABLET (12.5 MG TOTAL) BY MOUTH 2 (TWO) TIMES DAILY 03/09/19   Jerline Pain, MD  clopidogrel (PLAVIX) 75 MG tablet TAKE 1 TABLET BY MOUTH EVERY  DAY Patient taking differently: Take 75 mg by mouth daily.  08/16/19   Serafina Mitchell, MD  furosemide (LASIX) 20 MG tablet Take 1 tablet (20 mg total) by mouth daily. Please make yearly appt with Dr. Marlou Porch for January for future refills. 1st attempt Patient taking differently: Take 20 mg by mouth daily.  09/03/19   Jerline Pain, MD  guaiFENesin (MUCINEX) 600 MG 12 hr tablet Take 600 mg by mouth 2 (two) times daily as needed for to loosen phlegm.     [provider]  lisinopril (ZESTRIL) 5 MG tablet TAKE 1 TABLET BY MOUTH EVERY DAY Patient taking differently: Take 5 mg by mouth daily.  06/04/19   Jerline Pain, MD  ondansetron (ZOFRAN ODT) 4 MG disintegrating tablet Take 1 tablet (4 mg total) by mouth every 8 (eight) hours as needed for nausea or vomiting. 02/28/19   Serafina Mitchell, MD  STIOLTO RESPIMAT 2.5-2.5 MCG/ACT AERS INHALE 2 PUFFS BY MOUTH INTO THE LUNGS DAILY Patient taking differently: Inhale 2 puffs into the lungs daily.  06/15/19   Collene Gobble, MD     Eliseo Gum MSN, AGACNP-BC [place] [phone] If no answer, [phone] 10/13/2019, 11:35 AM

## 2019-10-13 NOTE — ED Notes (Signed)
Paged Admitting Provider about low BP. Provider advised they were okay with his BP as of now and to update them if it gets any lower.

## 2019-10-14 ENCOUNTER — Inpatient Hospital Stay (HOSPITAL_COMMUNITY): Payer: Medicare Other

## 2019-10-14 DIAGNOSIS — I340 Nonrheumatic mitral (valve) insufficiency: Secondary | ICD-10-CM

## 2019-10-14 DIAGNOSIS — J9811 Atelectasis: Secondary | ICD-10-CM | POA: Diagnosis present

## 2019-10-14 DIAGNOSIS — R54 Age-related physical debility: Secondary | ICD-10-CM | POA: Diagnosis present

## 2019-10-14 DIAGNOSIS — Z20828 Contact with and (suspected) exposure to other viral communicable diseases: Secondary | ICD-10-CM | POA: Diagnosis present

## 2019-10-14 DIAGNOSIS — J90 Pleural effusion, not elsewhere classified: Secondary | ICD-10-CM | POA: Diagnosis not present

## 2019-10-14 DIAGNOSIS — I714 Abdominal aortic aneurysm, without rupture: Secondary | ICD-10-CM | POA: Diagnosis present

## 2019-10-14 DIAGNOSIS — I255 Ischemic cardiomyopathy: Secondary | ICD-10-CM | POA: Diagnosis present

## 2019-10-14 DIAGNOSIS — Z66 Do not resuscitate: Secondary | ICD-10-CM | POA: Diagnosis present

## 2019-10-14 DIAGNOSIS — I959 Hypotension, unspecified: Secondary | ICD-10-CM | POA: Diagnosis not present

## 2019-10-14 DIAGNOSIS — I5043 Acute on chronic combined systolic (congestive) and diastolic (congestive) heart failure: Secondary | ICD-10-CM | POA: Diagnosis present

## 2019-10-14 DIAGNOSIS — Z8501 Personal history of malignant neoplasm of esophagus: Secondary | ICD-10-CM | POA: Diagnosis not present

## 2019-10-14 DIAGNOSIS — I2510 Atherosclerotic heart disease of native coronary artery without angina pectoris: Secondary | ICD-10-CM | POA: Diagnosis present

## 2019-10-14 DIAGNOSIS — E785 Hyperlipidemia, unspecified: Secondary | ICD-10-CM | POA: Diagnosis present

## 2019-10-14 DIAGNOSIS — M79662 Pain in left lower leg: Secondary | ICD-10-CM | POA: Diagnosis not present

## 2019-10-14 DIAGNOSIS — I509 Heart failure, unspecified: Secondary | ICD-10-CM | POA: Diagnosis present

## 2019-10-14 DIAGNOSIS — I10 Essential (primary) hypertension: Secondary | ICD-10-CM | POA: Diagnosis not present

## 2019-10-14 DIAGNOSIS — Z681 Body mass index (BMI) 19 or less, adult: Secondary | ICD-10-CM | POA: Diagnosis not present

## 2019-10-14 DIAGNOSIS — R634 Abnormal weight loss: Secondary | ICD-10-CM | POA: Diagnosis present

## 2019-10-14 DIAGNOSIS — R791 Abnormal coagulation profile: Secondary | ICD-10-CM | POA: Diagnosis present

## 2019-10-14 DIAGNOSIS — I083 Combined rheumatic disorders of mitral, aortic and tricuspid valves: Secondary | ICD-10-CM | POA: Diagnosis present

## 2019-10-14 DIAGNOSIS — I712 Thoracic aortic aneurysm, without rupture: Secondary | ICD-10-CM | POA: Diagnosis present

## 2019-10-14 DIAGNOSIS — J432 Centrilobular emphysema: Secondary | ICD-10-CM | POA: Diagnosis present

## 2019-10-14 DIAGNOSIS — I447 Left bundle-branch block, unspecified: Secondary | ICD-10-CM | POA: Diagnosis present

## 2019-10-14 DIAGNOSIS — I739 Peripheral vascular disease, unspecified: Secondary | ICD-10-CM | POA: Diagnosis present

## 2019-10-14 DIAGNOSIS — I480 Paroxysmal atrial fibrillation: Secondary | ICD-10-CM | POA: Diagnosis present

## 2019-10-14 DIAGNOSIS — E039 Hypothyroidism, unspecified: Secondary | ICD-10-CM | POA: Diagnosis present

## 2019-10-14 DIAGNOSIS — Z961 Presence of intraocular lens: Secondary | ICD-10-CM | POA: Diagnosis present

## 2019-10-14 DIAGNOSIS — I110 Hypertensive heart disease with heart failure: Secondary | ICD-10-CM | POA: Diagnosis present

## 2019-10-14 LAB — CBC WITH DIFFERENTIAL/PLATELET
Abs Immature Granulocytes: 0.03 10*3/uL (ref 0.00–0.07)
Basophils Absolute: 0 10*3/uL (ref 0.0–0.1)
Basophils Relative: 1 %
Eosinophils Absolute: 0.1 10*3/uL (ref 0.0–0.5)
Eosinophils Relative: 2 %
HCT: 42.1 % (ref 39.0–52.0)
Hemoglobin: 13.7 g/dL (ref 13.0–17.0)
Immature Granulocytes: 1 %
Lymphocytes Relative: 16 %
Lymphs Abs: 1 10*3/uL (ref 0.7–4.0)
MCH: 31.4 pg (ref 26.0–34.0)
MCHC: 32.5 g/dL (ref 30.0–36.0)
MCV: 96.6 fL (ref 80.0–100.0)
Monocytes Absolute: 0.7 10*3/uL (ref 0.1–1.0)
Monocytes Relative: 12 %
Neutro Abs: 4.3 10*3/uL (ref 1.7–7.7)
Neutrophils Relative %: 68 %
Platelets: 195 10*3/uL (ref 150–400)
RBC: 4.36 MIL/uL (ref 4.22–5.81)
RDW: 17.2 % — ABNORMAL HIGH (ref 11.5–15.5)
WBC: 6.2 10*3/uL (ref 4.0–10.5)
nRBC: 0 % (ref 0.0–0.2)

## 2019-10-14 LAB — BASIC METABOLIC PANEL
Anion gap: 13 (ref 5–15)
BUN: 39 mg/dL — ABNORMAL HIGH (ref 8–23)
CO2: 23 mmol/L (ref 22–32)
Calcium: 9.1 mg/dL (ref 8.9–10.3)
Chloride: 103 mmol/L (ref 98–111)
Creatinine, Ser: 1.64 mg/dL — ABNORMAL HIGH (ref 0.61–1.24)
GFR calc Af Amer: 46 mL/min — ABNORMAL LOW (ref >60)
GFR calc non Af Amer: 40 mL/min — ABNORMAL LOW (ref >60)
Glucose, Bld: 79 mg/dL (ref 70–99)
Potassium: 4.6 mmol/L (ref 3.5–5.1)
Sodium: 139 mmol/L (ref 135–145)

## 2019-10-14 LAB — LIPID PANEL
Cholesterol: 129 mg/dL (ref 0–200)
HDL: 42 mg/dL (ref >40)
LDL Cholesterol: 69 mg/dL (ref 0–99)
Total CHOL/HDL Ratio: 3.1 RATIO
Triglycerides: 90 mg/dL (ref <150)
VLDL: 18 mg/dL (ref 0–40)

## 2019-10-14 MED ORDER — FUROSEMIDE 20 MG PO TABS
20.0000 mg | ORAL_TABLET | Freq: Once | ORAL | Status: AC
  Administered 2019-10-14: 20 mg via ORAL
  Filled 2019-10-14: qty 1

## 2019-10-14 MED ORDER — FUROSEMIDE 40 MG PO TABS
40.0000 mg | ORAL_TABLET | Freq: Two times a day (BID) | ORAL | Status: DC
  Administered 2019-10-15: 40 mg via ORAL
  Filled 2019-10-14 (×2): qty 1

## 2019-10-14 NOTE — Progress Notes (Signed)
PROGRESS NOTE    Patient: Glen Green                            PCP: Alroy Dust, L.Marlou Sa, MD                    DOB: 10/31/1941            DOA: 10/13/2019 VHQ:[id number]             DOS: 10/14/2019, 12:44 PM   LOS: 1 day   Date of Service: The patient was seen and examined on 10/14/2019  Subjective:   The patient was seen and examined this morning, stable no acute distress. Only complaint of her left calf pain.  But denies any chest pain or shortness of breath this morning.   Brief Narrative:   78 y.o. male with PMH of combined systolic and diastolic CHF, CAD s/p CABG 2014, post op afib, prior lung CA and esophageal CA admitted with acute on chronic combined CHF. He was originally scheduled to undergo LE angio by Dr. Chaya Jan.  CXR demonstrated pulmonary edema, R pleural effusion and mass like appearance in R middle lung.    Assessment & Plan:   Principal Problem:   Supratherapeutic INR Active Problems:   Dyslipidemia   History of esophageal cancer   Hypothyroidism   History of lung cancer   AAA (abdominal aortic aneurysm) (HCC)   Essential hypertension, benign   Pleural effusion   Acute on chronic combined systolic and diastolic CHF (congestive heart failure) (HCC)   Acute on chronic combined systolic/diastolic congestive heart failure -Cardiology following, managing accordingly -Last 2D echocardiogram 10/13/2019 was reviewed ejection fracture 40-45%, severe MR/TR -Monitoring I's and O's,  - Daily weight 136.7 >>> 128.8 pounds -Currently appears euvolemic -Cardiology continue IV Lasix may switch to p.o.  H/o Coronary artery disease, status post CABG -Continue supportive care -Cardiology following -Currently on aspirin, statins, beta-blocker, (aspirin, Lipitor, Coreg) -Lisinopril on hold -Plavix on hold for long mass biopsy  Lung mass/history of lung and esophageal cancer -Imaging revealing right middle lobe mass -Pulmonologist consulted and following for possible  malignancy -Holding Plavix for possible biopsy (waiting for 5 days washout of Plavix before biopsy) -CT angiogram on 06/17/2019 showing stable 1.4 cm mass left renal mass compared to 04/15/2019  Peripheral artery disease -Plavix on hold as to above -Pending angiography  H/o Abdominal aortic aneurysm -Status post F EVAR  Thoracic aortic aneurysm -CTA 06/17/2019 revealing 5.0 x 5.7 cm -Close monitoring, cardiology following    Cultures; none Antimicrobials: None  DVT prophylaxis: SCD/Compression stockings Code Status:   Code Status: DNR Family Communication: No family member present at bedside- attempt will be made to update daily The above findings and plan of care has been discussed with patient and family in detail,  they expressed understanding and agreement of above. Disposition Plan:  >3 days Admission status:  NPATIEN -    I am not sure how much we are contributing to this case.  Appreciated cardiology will assume care.  TRH would like to sign off       Procedures:   Echo 10/13/2019 IMPRESSIONS FINDINGS Left Ventricle: Left ventricular ejection fraction, by visual estimation, is 40 to 45%. The left ventricle has mild to moderately decreased function. There is no left ventricular hypertrophy. Left ventricular diastolic function could not be evaluated.  Normal left atrial pressure. Antimicrobials:  Anti-infectives (From admission, onward)   None  Medication:  . aspirin EC  81 mg Oral Daily  . atorvastatin  40 mg Oral q1800  . carvedilol  12.5 mg Oral BID WC  . enoxaparin (LOVENOX) injection  40 mg Subcutaneous Q24H  . furosemide  40 mg Intravenous Q12H  . Ipratropium-Albuterol  1 puff Inhalation Daily  . sodium chloride flush  3 mL Intravenous Q12H    sodium chloride, acetaminophen, albuterol, guaiFENesin, ondansetron (ZOFRAN) IV, sodium chloride flush   Objective:   Vitals:   10/14/19 0400 10/14/19 0434 10/14/19 0839 10/14/19 0845  BP: 109/68  123/81 107/62 107/62  Pulse: 72 79 70   Resp: 17     Temp:  (!) 97.5 F (36.4 C)    TempSrc:  Oral    SpO2:  97%    Weight:  58.4 kg    Height:  5\' 10"  (1.778 m)      Intake/Output Summary (Last 24 hours) at 10/14/2019 1244 Last data filed at 10/14/2019 0931 Gross per 24 hour  Intake 207 ml  Output 1000 ml  Net -793 ml   Filed Weights   10/13/19 0554 10/14/19 0434  Weight: 62 kg 58.4 kg     Examination:   Physical Exam  Constitution:  Alert, cooperative, no distress,  Appears calm and comfortable  Psychiatric: Normal and stable mood and affect, cognition intact,   HEENT: Normocephalic, PERRL, otherwise with in Normal limits  Chest:Chest symmetric Cardio vascular:  S1/S2, RRR, No murmure, No Rubs or Gallops  pulmonary: Clear to auscultation bilaterally, respirations unlabored, negative wheezes / crackles Abdomen: Soft, non-tender, non-distended, bowel sounds,no masses, no organomegaly Muscular skeletal: Limited exam - in bed, able to move all 4 extremities, Normal strength,  Neuro: CNII-XII intact. , normal motor and sensation, reflexes intact  Extremities: No pitting edema lower extremities, +2 pulses  Skin: Dry, warm to touch, negative for any Rashes, No open wounds Wounds: per nursing documentation  LABs:  CBC Latest Ref Rng & Units 10/14/2019 10/13/2019 02/27/2019  WBC 4.0 - 10.5 K/uL 6.2 5.5 10.9(H)  Hemoglobin 13.0 - 17.0 g/dL 13.7 12.4(L) 9.8(L)  Hematocrit 39.0 - 52.0 % 42.1 40.6 30.4(L)  Platelets 150 - 400 K/uL 195 183 112(L)   CMP Latest Ref Rng & Units 10/14/2019 10/13/2019 02/27/2019  Glucose 70 - 99 mg/dL 79 98 107(H)  BUN 8 - 23 mg/dL 39(H) 33(H) 40(H)  Creatinine 0.61 - 1.24 mg/dL 1.64(H) 1.53(H) 1.57(H)  Sodium 135 - 145 mmol/L 139 142 138  Potassium 3.5 - 5.1 mmol/L 4.6 4.9 4.3  Chloride 98 - 111 mmol/L 103 111 110  CO2 22 - 32 mmol/L 23 21(L) 21(L)  Calcium 8.9 - 10.3 mg/dL 9.1 9.2 8.7(L)  Total Protein 6.5 - 8.1 g/dL - - -  Total Bilirubin 0.3 - 1.2  mg/dL - - -  Alkaline Phos 38 - 126 U/L - - -  AST 15 - 41 U/L - - -  ALT 0 - 44 U/L - - -    SIGNED: Deatra James, MD, FACP, FHM. [facility],  Pager [phone]  If 7PM-7AM, please contact night-coverage [email or web address] 10/14/2019, 12:44 PM

## 2019-10-14 NOTE — Progress Notes (Signed)
Progress Note  Patient Name: Glen Green Date of Encounter: 10/14/2019  Primary Cardiologist: Candee Furbish, MD   Subjective   I am breathing this morning better than I have ever been. No CP.  Inpatient Medications    Scheduled Meds: . aspirin EC  81 mg Oral Daily  . atorvastatin  40 mg Oral q1800  . carvedilol  12.5 mg Oral BID WC  . enoxaparin (LOVENOX) injection  40 mg Subcutaneous Q24H  . furosemide  40 mg Intravenous Q12H  . Ipratropium-Albuterol  1 puff Inhalation Daily  . sodium chloride flush  3 mL Intravenous Q12H   Continuous Infusions: . sodium chloride     PRN Meds: sodium chloride, acetaminophen, albuterol, guaiFENesin, ondansetron (ZOFRAN) IV, sodium chloride flush   Vital Signs    Vitals:   10/14/19 0113 10/14/19 0200 10/14/19 0400 10/14/19 0434  BP: 107/74 101/61 109/68 123/81  Pulse: 74  72 79  Resp: (!) 24 16 17    Temp:    (!) 97.5 F (36.4 C)  TempSrc:    Oral  SpO2: 92%   97%  Weight:    58.4 kg  Height:    5\' 10"  (1.778 m)    Intake/Output Summary (Last 24 hours) at 10/14/2019 0757 Last data filed at 10/13/2019 2226 Gross per 24 hour  Intake 7 ml  Output 1250 ml  Net -1243 ml   Last 3 Weights 10/14/2019 10/13/2019 10/05/2019  Weight (lbs) 128 lb 12.8 oz 136 lb 11 oz 137 lb 8 oz  Weight (kg) 58.423 kg 62 kg 62.37 kg      Telemetry    Sinus rhythm, no significant ventricular ectopy - Personally Reviewed  ECG    NSR with LBBB - Personally Reviewed  Physical Exam   GEN: No acute distress.   Neck: No JVD Cardiac: RRR, no murmurs, rubs, or gallops.  Respiratory: Clear to auscultation bilaterally. GI: Soft, nontender, non-distended  MS: No edema; No deformity. Neuro:  Nonfocal  Psych: Normal affect   Labs    High Sensitivity Troponin:   Recent Labs  Lab 10/13/19 0602 10/13/19 0817  TROPONINIHS 19* 21*      Chemistry Recent Labs  Lab 10/13/19 0614 10/14/19 0518  NA 142 139  K 4.9 4.6  CL 111 103  CO2 21* 23   GLUCOSE 98 79  BUN 33* 39*  CREATININE 1.53* 1.64*  CALCIUM 9.2 9.1  GFRNONAA 43* 40*  GFRAA 50* 46*  ANIONGAP 10 13     Hematology Recent Labs  Lab 10/13/19 0614 10/14/19 0518  WBC 5.5 6.2  RBC 3.92* 4.36  HGB 12.4* 13.7  HCT 40.6 42.1  MCV 103.6* 96.6  MCH 31.6 31.4  MCHC 30.5 32.5  RDW 18.2* 17.2*  PLT 183 195    BNP Recent Labs  Lab 10/13/19 0615  BNP 1,143.3*     DDimer No results for input(s): DDIMER in the last 168 hours.   Radiology    Dg Chest Portable 1 View  Result Date: 10/13/2019 CLINICAL DATA:  Shortness of breath EXAM: PORTABLE CHEST 1 VIEW COMPARISON:  February 25, 2019 FINDINGS: Heart size is enlarged. Aortic calcifications are noted. The patient is status post prior median sternotomy. There is a rounded density overlying the right mid lung zone favored to represent a loculated right-sided pleural effusion. Emphysematous changes are noted bilaterally. There are bibasilar airspace opacities which may represent atelectasis or infiltrate. IMPRESSION: 1. Loculated right-sided pleural effusion. There is a masslike appearance of this pleural fluid  in the right mid lung zone. This is similar to prior CT from July 2020. 2. Small left-sided pleural effusion. 3. Bibasilar airspace opacities favored to represent atelectasis, however an infiltrate is not excluded. Electronically Signed   By: Constance Holster M.D.   On: 10/13/2019 06:23    Cardiac Studies   Echo 10/13/2019 IMPRESSIONS    1. Left ventricular ejection fraction, by visual estimation, is 40 to 45%. The left ventricle has mild to moderately decreased function. There is no left ventricular hypertrophy.  2. Left ventricular diastolic function could not be evaluated.  3. Global right ventricle has normal systolic function.The right ventricular size is normal.  4. Left atrial size was mildly dilated.  5. Right atrial size was normal.  6. Mild mitral annular calcification.  7. The mitral valve is  normal in structure. Severe mitral valve regurgitation. No evidence of mitral stenosis.  8. The tricuspid valve is normal in structure. Tricuspid valve regurgitation is severe.  9. The aortic valve is tricuspid. Aortic valve regurgitation is not visualized. Mild to moderate aortic valve sclerosis/calcification without any evidence of aortic stenosis. 10. The pulmonic valve was normal in structure. Pulmonic valve regurgitation is mild. 11. Moderately elevated pulmonary artery systolic pressure. 12. The inferior vena cava is dilated in size with >50% respiratory variability, suggesting right atrial pressure of 8 mmHg. 13. Mild to moderate global reduction in LV systolic function; sclerotic aortic valve; severe, posteriorly directed MR; mild LAE; severe TR; moderate pulmonary hypertension.  FINDINGS  Left Ventricle: Left ventricular ejection fraction, by visual estimation, is 40 to 45%. The left ventricle has mild to moderately decreased function. There is no left ventricular hypertrophy. Left ventricular diastolic function could not be evaluated.  Normal left atrial pressure.  Patient Profile     78 y.o. male with PMH of combined systolic and diastolic CHF, CAD s/p CABG 2014, post op afib, prior lung CA and esophageal CA admitted with acute on chronic combined CHF. He was originally scheduled to undergo LE angio by Dr. Chaya Jan. CXR demonstrated pulmonary edema, R pleural effusion and mass like appearance in R middle lung.   Assessment & Plan    1. Acute on chronic combined systolic and diastolic CHF  - Echo 96/06/5915 EF 40-45%, severe MR and severe TR  - weight down from 136.7 lbs to 128.8 lbs  - he appears to be euvolemic on exam. I/O not accurate.    - consider switch IV lasix to 40mg  PO lasix daily. MD to comment on the severe mitral regurgitation seen on echo. May consider repeat limited echo in a few weeks to see if MR has improved after diuresis.    2. CAD s/p CABG: continue aspirin,  lipitor and coreg. Lisinopril on hold given diuresis. Plavix on hold for possible biopsy given lung mass seen on image.   3. H/o lung and esophageal CA: mass like appearance in R middle lung on image. Pulmonology working up possible malignancy. 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 HTN: BP stable.   5. PAD: originally planned for angiogram on 11/3 due to LLE claudication  - depend on lung mass workup, may still consider LE angiography prior to discharge.   6. AAA s/p FEVAR  7. Severe MR: MD to review. See #1  8. Thoracic aortic aneurysm: slightly increased on  previous CTA of aorta 06/17/2019,  5.0 x 4.7 cm       For questions or updates, please contact [place] Please consult [email or web address] for contact info under        Signed, Almyra Deforest, [place]  10/14/2019, 7:57 AM

## 2019-10-14 NOTE — Plan of Care (Signed)
  Problem: Activity: Goal: Capacity to carry out activities will improve Outcome: Progressing   Problem: Cardiac: Goal: Ability to achieve and maintain adequate cardiopulmonary perfusion will improve Outcome: Progressing   Problem: Education: Goal: Knowledge of General Education information will improve Description: Including pain rating scale, medication(s)/side effects and non-pharmacologic comfort measures Outcome: Progressing   Problem: Safety: Goal: Ability to remain free from injury will improve Outcome: Progressing

## 2019-10-14 NOTE — Evaluation (Signed)
Physical Therapy Evaluation Patient Details Name: Glen Green MRN: [id number] DOB: 09-Dec-1941 Today's Date: 10/14/2019   History of Present Illness  78 yo male admitted to ED on 11/3 with acute on chronic CHF exacerbation with associated shortness of breath. Pt with R pleural effusion, vs malignancy. Pt additionally with LLE claudication scheduled for angioplasty on 11/3, expected to be performed prior to d/c. PMH includes lung cancer s/p lobectomy, afib, adrenal carcinoma s/p adrenalectomy 2005, esophegeal cancer s/p esophogastrectomy, SBO, CVA without residual deficits, SBO, brain aneurysm s/p repair.  Clinical Impression   Pt presents with generalized weakness, increased time and effort to mobilize, dyspnea on exertion with O2 desaturation to 87% on RA recoverable >90% with rest, and decreased activity tolerance. Pt to benefit from acute PT to address deficits. Pt ambulated hallway distance with no AD with slow and steady gait, pt most limited by DOE 2/4 with O2 desaturation. RN informed and may place pt on supplemental O2 for pt recovery, pt satting at 91% upon PT exit. No PT follow up recommended at this time,  PT will continue to follow acutely to ensure progression of mobility and to follow up after LLE surgery if occurs acutely.      Follow Up Recommendations No PT follow up;Supervision for mobility/OOB    Equipment Recommendations  None recommended by PT    Recommendations for Other Services       Precautions / Restrictions Precautions Precautions: Fall(moderate) Precaution Comments: watch sats, DOE Restrictions Weight Bearing Restrictions: No      Mobility  Bed Mobility Overal bed mobility: Needs Assistance Bed Mobility: Supine to Sit     Supine to sit: Supervision;HOB elevated     General bed mobility comments: supervision for safety, use of bedrails and HOB elevation to come to sitting.  Transfers Overall transfer level: Modified independent                General transfer comment: increased time to rise and self-steady.  Ambulation/Gait Ambulation/Gait assistance: Min guard;Supervision Gait Distance (Feet): 400 Feet Assistive device: None Gait Pattern/deviations: Step-through pattern;Trunk flexed;Decreased stride length Gait velocity: decr   General Gait Details: min guard to supervision for safety. Pt with no LOB during ambulation, slow and steady gait.  Stairs            Wheelchair Mobility    Modified Rankin (Stroke Patients Only)       Balance Overall balance assessment: Mild deficits observed, not formally tested                           High level balance activites: Direction changes;Turns;Head turns;Other (comment) High Level Balance Comments: turning 180*, head turning horizonal and vertical, and stepping over obstacles WFL, no LOB noted.             Pertinent Vitals/Pain Pain Assessment: No/denies pain Pain Intervention(s): Limited activity within patient's tolerance;Monitored during session    [place] expects to be discharged to:: Private residence Living Arrangements: Spouse/significant other;Other relatives(sister-in-law with CVA and significant residual deficits, per pt she is independent with mobility and self-care at this time) Available Help at Discharge: Family;Available 24 hours/day Type of Home: House Home Access: Stairs to enter Entrance Stairs-Rails: Left Entrance Stairs-Number of Steps: 2 Home Layout: One level Home Equipment: Grab bars - tub/shower;Hand held shower head      Prior Function Level of Independence: Independent         Comments: pt enjoys playing golf, being  outside. Pt states he drives, is completely independent. Pt states he is currently limited by LLE claudication pain.     Hand Dominance   Dominant Hand: Right    Extremity/Trunk Assessment   Upper Extremity Assessment Upper Extremity Assessment: Overall WFL for tasks assessed     Lower Extremity Assessment Lower Extremity Assessment: Generalized weakness    Cervical / Trunk Assessment Cervical / Trunk Assessment: Other exceptions;Kyphotic Cervical / Trunk Exceptions: forward head position  Communication   Communication: HOH  Cognition Arousal/Alertness: Awake/alert Behavior During Therapy: WFL for tasks assessed/performed Overall Cognitive Status: Within Functional Limits for tasks assessed                                        General Comments General comments (skin integrity, edema, etc.): DOE 2/4, SpO2 87% on RA during ambulation with accompanying complaints of dyspnea, recovered to 90-91% on RA with rest    Exercises     Assessment/Plan    PT Assessment Patient needs continued PT services  PT Problem List Decreased strength;Decreased activity tolerance;Cardiopulmonary status limiting activity       PT Treatment Interventions Therapeutic activities;Gait training;Therapeutic exercise;Patient/family education;Balance training;Stair training;Functional mobility training    PT Goals (Current goals can be found in the Care Plan section)  Acute Rehab PT Goals Patient Stated Goal: go home, back to golfing PT Goal Formulation: With patient Time For Goal Achievement: 10/28/19 Potential to Achieve Goals: Good    Frequency Min 3X/week   Barriers to discharge        Co-evaluation               AM-PAC PT "6 Clicks" Mobility  Outcome Measure Help needed turning from your back to your side while in a flat bed without using bedrails?: None Help needed moving from lying on your back to sitting on the side of a flat bed without using bedrails?: None Help needed moving to and from a bed to a chair (including a wheelchair)?: None Help needed standing up from a chair using your arms (e.g., wheelchair or bedside chair)?: None Help needed to walk in hospital room?: A Little Help needed climbing 3-5 steps with a railing? : A Little 6  Click Score: 22    End of Session Equipment Utilized During Treatment: Gait belt Activity Tolerance: Patient tolerated treatment well Patient left: in chair;with call bell/phone within reach(with chair pad placed, RN informed that pt agrees not to mobilize without pressing call button and waiting for assist but RN can plug in chair alarm if needed) Nurse Communication: Mobility status PT Visit Diagnosis: Muscle weakness (generalized) (M62.81);Other abnormalities of gait and mobility (R26.89)    Time: 6967-8938 PT Time Calculation (min) (ACUTE ONLY): 21 min   Charges:   PT Evaluation $PT Eval Low Complexity: 1 Low         Tyrah Broers E, PT Acute Rehabilitation Services Pager [phone]  Office [phone]  Liliane Mallis D Mariachristina Holle 10/14/2019, 9:50 AM

## 2019-10-14 NOTE — TOC Initial Note (Signed)
Transition of Care [facility]) - Initial/Assessment Note    Patient Details  Name: Glen Green MRN: [id number] Date of Birth: 09-28-1941  Transition of Care [facility]) CM/SW Contact:    Bethena Roys, RN Phone Number: 10/14/2019, 2:44 PM  Clinical Narrative: Pt presented for SOB- CHF exacerbation-continues on IV Lasix. PTA from home with spouse. Wife drives patient to appointments and patient uses [facility] Plan will be for patient to return home without any services. CM will continue to monitor for transition of care needs.                   Expected Discharge Plan: Home/Self Care Barriers to Discharge: No Barriers Identified   Patient Goals and CMS Choice Patient states their goals for this hospitalization and ongoing recovery are:: "to return home"      Expected Discharge Plan and Services Expected Discharge Plan: Home/Self Care In-house Referral: NA Discharge Planning Services: CM Consult Post Acute Care Choice: NA Living arrangements for the past 2 months: Single Family Home                 HH Arranged: NA    Prior Living Arrangements/Services Living arrangements for the past 2 months: Single Family Home Lives with:: Spouse Patient language and need for interpreter reviewed:: Yes Do you feel safe going back to the place where you live?: Yes      Need for Family Participation in Patient Care: Yes (Comment) Care giver support system in place?: Yes (comment)   Criminal Activity/Legal Involvement Pertinent to Current Situation/Hospitalization: No - Comment as needed  Activities of Daily Living Home Assistive Devices/Equipment: None ADL Screening (condition at time of admission) Patient's cognitive ability adequate to safely complete daily activities?: Yes Is the patient deaf or have difficulty hearing?: Yes Does the patient have difficulty seeing, even when wearing glasses/contacts?: No Does the patient have difficulty concentrating, remembering,  or making decisions?: No Patient able to express need for assistance with ADLs?: Yes Does the patient have difficulty dressing or bathing?: No Independently performs ADLs?: Yes (appropriate for developmental age) Does the patient have difficulty walking or climbing stairs?: Yes("a few") Weakness of Legs: Both Weakness of Arms/Hands: None  Permission Sought/Granted Permission sought to share information with : Family Supports     Emotional Assessment Appearance:: Appears stated age Attitude/Demeanor/Rapport: Engaged Affect (typically observed): Appropriate Orientation: : Oriented to Situation, Oriented to  Time, Oriented to Place, Oriented to Self Alcohol / Substance Use: Not Applicable Psych Involvement: No (comment)  Admission diagnosis:  Congestive heart failure, unspecified HF chronicity, unspecified heart failure type ([place]) [I50.9] Acute on chronic combined systolic and diastolic CHF (congestive heart failure) ([place]) [I50.43] Patient Active Problem List   Diagnosis Date Noted  . Supratherapeutic INR 10/14/2019  . Severe mitral regurgitation   . Acute on chronic combined systolic and diastolic CHF (congestive heart failure) ([place]) 10/13/2019  . Acute renal failure superimposed on stage 3a chronic kidney disease ([place])   . Status post aortic coarctation stent placement 02/25/2019  . S/P AAA repair 02/25/2019  . 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[place]) 04/10/2018  . Pleural effusion 04/02/2018  . Pulmonary emphysema ([place])   . Acute respiratory failure with hypoxia ([place]) 03/08/2018  . Acute on chronic clinical systolic heart failure ([place]) 09/13/2014  . Aneurysm of abdominal vessel ([place]) 08/23/2014  . Cardiomyopathy, ischemic 06/16/2014  . Essential hypertension, benign 06/16/2014  . Pure hypercholesterolemia 06/16/2014  . Postoperative atrial fibrillation ([place]) 12/04/2013  . Coronary atherosclerosis of native  coronary artery 09/09/2013  . NSTEMI (non-ST elevated  myocardial infarction) ([place]) 07/30/2013  . AAA (abdominal aortic aneurysm) ([place]) 08/22/2012  . Bilateral hearing loss 08/22/2012  . PVD (peripheral vascular disease) ([place]) 08/22/2012  . Preventative health care 08/15/2012  . 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 SBO (small bowel obstruction) ([place]) 08/15/2012  . Hypokalemia 12/09/2011  . Weakness generalized 12/05/2011  . Protein-calorie malnutrition, moderate ([place]) 12/05/2011  . Hyponatremia 12/05/2011  . Dyslipidemia   . History of esophageal cancer   . Hypothyroidism   . Cerebral aneurysm   . History of lung cancer    PCP:  Alroy Dust, L.Marlou Sa, MD Pharmacy:   [id number] [place] [place] [facility] [address] [place] [address] Phone: [phone] Fax: [phone]    Social Determinants of Health (SDOH) Interventions    Readmission Risk Interventions No flowsheet data found.

## 2019-10-14 NOTE — Progress Notes (Addendum)
Dr. Lamonte Sakai will follow up on 10/15/19.  Richardson Landry Minor ACNP Maryanna Shape PCCM Pager [phone] till 1 pm If no answer page [phone] 10/14/2019, 10:00 AM  Attending Note:  CT of the chest without contrast ordered per Dr. Agustina Caroli request for consideration of bronchsocopy  Rush Farmer, M.D. [facility] Pulmonary/Critical Care Medicine.

## 2019-10-14 NOTE — ED Notes (Signed)
Pt removed all monitoring wires when trying to get comfortable. A&Ox4, coffee given per request, cardiac wires replaced

## 2019-10-14 NOTE — Progress Notes (Signed)
    Subjective  -   Feels he is improving.   Physical Exam:  Alert and oriented Nonlabored breathing Extremities are warm and well-perfused   Assessment/Plan:    Patient was originally scheduled for angiography yesterday for treatment of severe claudication.  This was canceled due to his admission for shortness of breath.  I discussed with the patient that we will place him back on the angiogram schedule in a few weeks once he is recovered from his acute illness.  Wells Elliana Bal 10/14/2019 7:10 PM --  Vitals:   10/14/19 1451 10/14/19 1635  BP: (!) 92/53 (!) 91/55  Pulse: 75   Resp:    Temp: (!) 97.5 F (36.4 C)   SpO2: 91% 94%    Intake/Output Summary (Last 24 hours) at 10/14/2019 1910 Last data filed at 10/14/2019 0931 Gross per 24 hour  Intake 207 ml  Output 1000 ml  Net -793 ml     Laboratory CBC    Component Value Date/Time   WBC 6.2 10/14/2019 0518   HGB 13.7 10/14/2019 0518   HGB 14.5 05/10/2008 0822   HCT 42.1 10/14/2019 0518   HCT 42.8 05/10/2008 0822   PLT 195 10/14/2019 0518   PLT 241 05/10/2008 0822    BMET    Component Value Date/Time   NA 139 10/14/2019 0518   K 4.6 10/14/2019 0518   CL 103 10/14/2019 0518   CO2 23 10/14/2019 0518   GLUCOSE 79 10/14/2019 0518   BUN 39 (H) 10/14/2019 0518   CREATININE 1.64 (H) 10/14/2019 0518   CREATININE 0.99 06/22/2013 0912   CALCIUM 9.1 10/14/2019 0518   GFRNONAA 40 (L) 10/14/2019 0518   GFRAA 46 (L) 10/14/2019 0518    COAG Lab Results  Component Value Date   INR 1.4 (H) 02/25/2019   INR 1.1 02/18/2019   INR 1.52 (H) 07/31/2013   No results found for: PTT  Antibiotics Anti-infectives (From admission, onward)   None       V. Leia Alf, M.D., [facility] [place] Office: [phone] Pager:  [phone]

## 2019-10-14 NOTE — H&P (View-Only) (Signed)
    Subjective  -   Feels he is improving.   Physical Exam:  Alert and oriented Nonlabored breathing Extremities are warm and well-perfused   Assessment/Plan:    Patient was originally scheduled for angiography yesterday for treatment of severe claudication.  This was canceled due to his admission for shortness of breath.  I discussed with the patient that we will place him back on the angiogram schedule in a few weeks once he is recovered from his acute illness.  Wells Brabham 10/14/2019 7:10 PM --  Vitals:   10/14/19 1451 10/14/19 1635  BP: (!) 92/53 (!) 91/55  Pulse: 75   Resp:    Temp: (!) 97.5 F (36.4 C)   SpO2: 91% 94%    Intake/Output Summary (Last 24 hours) at 10/14/2019 1910 Last data filed at 10/14/2019 0931 Gross per 24 hour  Intake 207 ml  Output 1000 ml  Net -793 ml     Laboratory CBC    Component Value Date/Time   WBC 6.2 10/14/2019 0518   HGB 13.7 10/14/2019 0518   HGB 14.5 05/10/2008 0822   HCT 42.1 10/14/2019 0518   HCT 42.8 05/10/2008 0822   PLT 195 10/14/2019 0518   PLT 241 05/10/2008 0822    BMET    Component Value Date/Time   NA 139 10/14/2019 0518   K 4.6 10/14/2019 0518   CL 103 10/14/2019 0518   CO2 23 10/14/2019 0518   GLUCOSE 79 10/14/2019 0518   BUN 39 (H) 10/14/2019 0518   CREATININE 1.64 (H) 10/14/2019 0518   CREATININE 0.99 06/22/2013 0912   CALCIUM 9.1 10/14/2019 0518   GFRNONAA 40 (L) 10/14/2019 0518   GFRAA 46 (L) 10/14/2019 0518    COAG Lab Results  Component Value Date   INR 1.4 (H) 02/25/2019   INR 1.1 02/18/2019   INR 1.52 (H) 07/31/2013   No results found for: PTT  Antibiotics Anti-infectives (From admission, onward)   None       V. Leia Alf, M.D., [facility] [place] Office: [phone] Pager:  [phone]

## 2019-10-15 ENCOUNTER — Inpatient Hospital Stay (HOSPITAL_COMMUNITY): Payer: Medicare Other

## 2019-10-15 DIAGNOSIS — J90 Pleural effusion, not elsewhere classified: Secondary | ICD-10-CM

## 2019-10-15 DIAGNOSIS — Z85118 Personal history of other malignant neoplasm of bronchus and lung: Secondary | ICD-10-CM

## 2019-10-15 LAB — BODY FLUID CELL COUNT WITH DIFFERENTIAL
Eos, Fluid: 0 %
Lymphs, Fluid: 76 %
Monocyte-Macrophage-Serous Fluid: 18 % — ABNORMAL LOW (ref 50–90)
Neutrophil Count, Fluid: 6 % (ref 0–25)
Total Nucleated Cell Count, Fluid: 170 cu mm (ref 0–1000)

## 2019-10-15 LAB — BASIC METABOLIC PANEL
Anion gap: 10 (ref 5–15)
BUN: 44 mg/dL — ABNORMAL HIGH (ref 8–23)
CO2: 26 mmol/L (ref 22–32)
Calcium: 8.9 mg/dL (ref 8.9–10.3)
Chloride: 102 mmol/L (ref 98–111)
Creatinine, Ser: 1.59 mg/dL — ABNORMAL HIGH (ref 0.61–1.24)
GFR calc Af Amer: 48 mL/min — ABNORMAL LOW (ref >60)
GFR calc non Af Amer: 41 mL/min — ABNORMAL LOW (ref >60)
Glucose, Bld: 90 mg/dL (ref 70–99)
Potassium: 4.1 mmol/L (ref 3.5–5.1)
Sodium: 138 mmol/L (ref 135–145)

## 2019-10-15 LAB — PROTEIN, PLEURAL OR PERITONEAL FLUID: Total protein, fluid: <3 g/dL

## 2019-10-15 LAB — LACTATE DEHYDROGENASE, PLEURAL OR PERITONEAL FLUID: LD, Fluid: 92 U/L — ABNORMAL HIGH (ref 3–23)

## 2019-10-15 LAB — AMYLASE, PLEURAL OR PERITONEAL FLUID: Amylase, Fluid: 64 U/L

## 2019-10-15 MED ORDER — IPRATROPIUM-ALBUTEROL 20-100 MCG/ACT IN AERS
1.0000 | INHALATION_SPRAY | Freq: Four times a day (QID) | RESPIRATORY_TRACT | Status: DC | PRN
  Filled 2019-10-15: qty 4

## 2019-10-15 MED ORDER — UMECLIDINIUM-VILANTEROL 62.5-25 MCG/INH IN AEPB
1.0000 | INHALATION_SPRAY | Freq: Every day | RESPIRATORY_TRACT | Status: DC
  Administered 2019-10-16: 1 via RESPIRATORY_TRACT
  Filled 2019-10-15: qty 14

## 2019-10-15 MED ORDER — FUROSEMIDE 40 MG PO TABS
40.0000 mg | ORAL_TABLET | Freq: Every day | ORAL | Status: DC
  Administered 2019-10-16: 40 mg via ORAL
  Filled 2019-10-15: qty 1

## 2019-10-15 NOTE — Progress Notes (Signed)
PROGRESS NOTE    Patient: Glen Green                            PCP: Alroy Dust, L.Marlou Sa, MD                    DOB: [birth year]            DOA: 10/13/2019 WPY:[id number]             DOS: 10/15/2019, 2:00 PM   LOS: 2 days   Date of Service: The patient was seen and examined on 10/15/2019  Subjective:   The patient was seen and examined this morning, stable denies any chest pain or shortness of breath. No events overnight.  Cardiology following closely, have consulted pulmonary for possible thoracentesis, evaluation of lung mass. Patient was made aware that pulmonary is considering thoracentesis today.   Brief Narrative:   78 y.o. male with PMH of combined systolic and diastolic CHF, CAD s/p CABG 2014, post op afib, prior lung CA and esophageal CA admitted with acute on chronic combined CHF. He was originally scheduled to undergo LE angio by Dr. Chaya Jan.  CXR demonstrated pulmonary edema, R pleural effusion and mass like appearance in R middle lung.    Assessment & Plan:   Principal Problem:   Supratherapeutic INR Active Problems:   Dyslipidemia   History of esophageal cancer   Hypothyroidism   History of lung cancer   AAA (abdominal aortic aneurysm) (HCC)   Essential hypertension, benign   Pleural effusion   Acute on chronic combined systolic and diastolic CHF (congestive heart failure) (HCC)   Severe mitral regurgitation   Acute on chronic combined systolic/diastolic congestive heart failure --Patient remains stable denies any chest pain, shortness of breath now.  -Last 2D echocardiogram 10/13/2019 was reviewed ejection fracture 40-45%, severe MR/TR -Monitoring I's and O's,  - Daily weight 136.7 >>> 128.8 pounds -Currently appears euvolemic -Cardiology continue IV Lasix may switch to p.o.  H/o Coronary artery disease, status post CABG /with severe MR/TR -Continue supportive care -Cardiology following -Currently on aspirin, statins, beta-blocker, (aspirin, Lipitor,  Coreg) -Lisinopril on hold -Plavix on hold for long mass biopsy  Masslike appearance in the right mid lower lobe/history of lung and esophageal cancer / Right pleural effusion -Imaging revealing right middle lobe mass/and pleural effusion -Pulmonary has consulted interventional radiologist to access possible loculated pleural effusion for thoracentesis, and analysis of fluid possibly cancer.  -Pulmonologist consulted and following for possible malignancy -Holding Plavix for possible biopsy (waiting for 5 days washout of Plavix before biopsy) -CT angiogram on 06/17/2019 showing stable 1.4 cm mass left renal mass compared to 04/15/2019 -Ordered alpha-fetoprotein levels    Peripheral artery disease -Plavix on hold as to above -Pending angiography  H/o Abdominal aortic aneurysm -Status post F EVAR  Thoracic aortic aneurysm -CTA 06/17/2019 revealing 5.0 x 5.7 cm -Close monitoring, cardiology following    Cultures; none Antimicrobials: None  DVT prophylaxis: SCD/Compression stockings Code Status:   Code Status: DNR Family Communication: No family member present at bedside- attempt will be made to update daily The above findings and plan of care has been discussed with patient and family in detail,  they expressed understanding and agreement of above. Disposition Plan:  >3 days Admission status:  NPATIEN -    I am not sure how much we are contributing to this case.  Appreciated cardiology will assume care.  TRH would like to sign off  Procedures:   Echo 10/13/2019 IMPRESSIONS FINDINGS Left Ventricle: Left ventricular ejection fraction, by visual estimation, is 40 to 45%. The left ventricle has mild to moderately decreased function. There is no left ventricular hypertrophy. Left ventricular diastolic function could not be evaluated.  Normal left atrial pressure. Antimicrobials:  Anti-infectives (From admission, onward)   None       Medication:  . aspirin EC   81 mg Oral Daily  . atorvastatin  40 mg Oral q1800  . carvedilol  12.5 mg Oral BID WC  . enoxaparin (LOVENOX) injection  40 mg Subcutaneous Q24H  . [START ON 10/16/2019] furosemide  40 mg Oral Daily  . sodium chloride flush  3 mL Intravenous Q12H  . umeclidinium-vilanterol  1 puff Inhalation Daily    sodium chloride, acetaminophen, guaiFENesin, Ipratropium-Albuterol, ondansetron (ZOFRAN) IV, sodium chloride flush   Objective:   Vitals:   10/15/19 0427 10/15/19 0800 10/15/19 0917 10/15/19 1300  BP: 94/65 94/68  (!) 89/60  Pulse: 76 91  97  Resp:      Temp: 97.9 F (36.6 C) 97.6 F (36.4 C)  (!) 97.4 F (36.3 C)  TempSrc: Oral Oral  Oral  SpO2: 93% 94% 94% 97%  Weight: 58.3 kg     Height:        Intake/Output Summary (Last 24 hours) at 10/15/2019 1400 Last data filed at 10/15/2019 1100 Gross per 24 hour  Intake 750 ml  Output 450 ml  Net 300 ml   Filed Weights   10/13/19 0554 10/14/19 0434 10/15/19 0427  Weight: 62 kg 58.4 kg 58.3 kg     Examination:   Physical Exam  BP (!) 89/60 (BP Location: Right Arm)   Pulse 97   Temp (!) 97.4 F (36.3 C) (Oral)   Resp 17   Ht 5\' 10"  (1.778 m)   Wt 58.3 kg   SpO2 97%   BMI 18.45 kg/m    Physical Exam  Constitution:  Alert, cooperative, no distress,  Psychiatric: Normal and stable mood and affect, cognition intact,   HEENT: Normocephalic, PERRL, otherwise with in Normal limits  Chest:Chest symmetric Cardio vascular:  S1/S2, RRR, No murmure, No Rubs or Gallops  pulmonary: Clear to auscultation bilaterally, respirations unlabored, negative wheezes / crackles Abdomen: Soft, non-tender, non-distended, bowel sounds,no masses, no organomegaly Muscular skeletal: Limited exam - in bed, able to move all 4 extremities, Normal strength,  Neuro: CNII-XII intact. , normal motor and sensation, reflexes intact  Extremities: No pitting edema lower extremities, +2 pulses  Skin: Dry, warm to touch, negative for any Rashes, No open  wounds Wounds: per nursing documentation     LABs:  CBC Latest Ref Rng & Units 10/14/2019 10/13/2019 02/27/2019  WBC 4.0 - 10.5 K/uL 6.2 5.5 10.9(H)  Hemoglobin 13.0 - 17.0 g/dL 13.7 12.4(L) 9.8(L)  Hematocrit 39.0 - 52.0 % 42.1 40.6 30.4(L)  Platelets 150 - 400 K/uL 195 183 112(L)   CMP Latest Ref Rng & Units 10/15/2019 10/14/2019 10/13/2019  Glucose 70 - 99 mg/dL 90 79 98  BUN 8 - 23 mg/dL 44(H) 39(H) 33(H)  Creatinine 0.61 - 1.24 mg/dL 1.59(H) 1.64(H) 1.53(H)  Sodium 135 - 145 mmol/L 138 139 142  Potassium 3.5 - 5.1 mmol/L 4.1 4.6 4.9  Chloride 98 - 111 mmol/L 102 103 111  CO2 22 - 32 mmol/L 26 23 21(L)  Calcium 8.9 - 10.3 mg/dL 8.9 9.1 9.2  Total Protein 6.5 - 8.1 g/dL - - -  Total Bilirubin 0.3 - 1.2 mg/dL - - -  Alkaline Phos 38 - 126 U/L - - -  AST 15 - 41 U/L - - -  ALT 0 - 44 U/L - - -    SIGNED: Deatra James, MD, FACP, FHM. [facility],  Pager [phone]  If 7PM-7AM, please contact night-coverage [email or web address], Password [facility] 10/15/2019, 2:00 PM

## 2019-10-15 NOTE — Progress Notes (Signed)
Pt BP 89/60. Pt asymptomatic. Made aware to call if dizziness occurs or if he starts to feel different. Cont to monitor. Carroll Kinds RN

## 2019-10-15 NOTE — Progress Notes (Signed)
Spoke with Angie Duke PA about BP 94/48. Pt on coreg and po lasix. Asked her to assess to see if she still wanted pt to receive. Angie made note to cont both. Cont to monitor. Carroll Kinds RN

## 2019-10-15 NOTE — Consult Note (Signed)
NAME:  Glen Green, MRN:  [id number], DOB:  [birth year], LOS: 2 ADMISSION DATE:  10/13/2019, CONSULTATION DATE:  10/13/19 REFERRING MD:  Lorin Mercy - TRH, CHIEF COMPLAINT:  SOB  Brief History   78 yo M presenting with SOB noted to have loculated appearing R sided effusion and small L sided effusion.   History of present illness   78 yo M PMH  lung and esophageal Cancer s/p esophagectomy and L upper lobectomy,  AAA s/p FEVAR, CVA, Afib, CAD, emphysema (followed by Dr. Lamonte Sakai PCCM) who presents 11/3 for SOB. Patient initially scheduled for angiogram 11/3 LLE claudication with identified popliteal lesion. Patient with worsening SOB this AM and instead presents to ED. Symptoms began 3 days ago and have progressed. Denies chest pain, endorses DOE, significant orthopnea. LLE cramping pain, no significant change from baseline. No new BLE edema or flank edema. Patient endorses non-compliance with plavix at home due to bleeding. Last plavix dose 11/1 vs 11/2 per patient's wife.   SARS COV2 negative   BNP 1143 in ED hsTroponin 21 Cr 1.53, K 4.9,  WBC 5.5 Lactic acid 2.0  H/H 12.4/40.6, plt 183   Past Medical History  Lung cancer esophageal cancer AAA s/p FEVAR CAD HLD HTN A fib  CVA  Significant Hospital Events   11/3 admitted to hospitalist. PCCM consulted for possible thoracentesis in setting of loculated R sided effusion  Consults:  PCCM  Procedures:    Significant Diagnostic Tests:  11/3 CXR > enlarged cardiac silhouette. S/p sternotomy. Emphysematous changes. Bilateral ASD -- infiltrate vs atelectasis. RML density, likely loculated effusion. Small L sided effusion   CT chest 11/4 >> multi focal loculated right pleural effusion that involves both the major and minor fissures, moderate to severe bilateral emphysematous changes, ascending aortic aneurysm 4.3 cm, stable mediastinal hilar lymphadenopathy, exophytic left renal nodule  Micro Data:  11/3 SARS CoV2> neg  11/3 RVP >>> negative   Antimicrobials:   Interim history/subjective:  Patient has been weaned to room air Denies any dyspnea, at least at rest  Objective   Blood pressure 94/68, pulse 91, temperature 97.6 F (36.4 C), temperature source Oral, resp. rate 17, height 5\' 10"  (1.778 m), weight 58.3 kg, SpO2 94 %.        Intake/Output Summary (Last 24 hours) at 10/15/2019 1322 Last data filed at 10/15/2019 1100 Gross per 24 hour  Intake 750 ml  Output 450 ml  Net 300 ml   Filed Weights   10/13/19 0554 10/14/19 0434 10/15/19 0427  Weight: 62 kg 58.4 kg 58.3 kg    Examination: General: Thin chronically ill man, no distress on room air HENT: Oropharynx clear, no lesions Lungs: Decreased bilaterally, no wheezes, some scattered right-sided inspiratory crackles Cardiovascular: Regular, no murmur Abdomen: Soft, nondistended, positive bowel sounds Extremities: Left lower extremity cool, no cyanosis, tender to palpation Neuro: Awake, alert, interacting, moves all extremities, follows commands  Resolved Hospital Problem list     Assessment & Plan:   Loculated, slightly enlarged right pleural effusion with involvement in the major and minor fissures consistent with a pseudotumor.  Suspect that this corresponds with his overall volume overload at presentation.  Must also consider malignancy given his history of lung cancer, esophageal cancer.  P Discussed the pros and cons of thoracentesis with him today.  I think that this will be principally diagnostic as he is not significantly symptomatic.  Most important aspect of the evaluation would be cytology, also beneficial to determine whether this is an exudate  or transudate.  He is agreeable to proceed, has been off Plavix since at least 11/2.  I will ask interventional radiology to evaluate and see if they can get into one of the loculated pockets for thoracentesis.  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 Combivent  as needed  Peripheral vascular disease P Was planning for evaluation by Dr. Trula Slade with vascular surgery Angiography pending   Labs   CBC: Recent Labs  Lab 10/13/19 0614 10/14/19 0518  WBC 5.5 6.2  NEUTROABS 3.5 4.3  HGB 12.4* 13.7  HCT 40.6 42.1  MCV 103.6* 96.6  PLT 183 536    Basic Metabolic Panel: Recent Labs  Lab 10/13/19 0614 10/14/19 0518 10/15/19 0313  NA 142 139 138  K 4.9 4.6 4.1  CL 111 103 102  CO2 21* 23 26  GLUCOSE 98 79 90  BUN 33* 39* 44*  CREATININE 1.53* 1.64* 1.59*  CALCIUM 9.2 9.1 8.9   GFR: Estimated Creatinine Clearance: 32.1 mL/min (A) (by C-G formula based on SCr of 1.59 mg/dL (H)). Recent Labs  Lab 10/13/19 0614 10/13/19 0615 10/13/19 0817 10/14/19 0518  WBC 5.5  --   --  6.2  LATICACIDVEN  --  2.0* 1.1  --     Liver Function Tests: No results for input(s): AST, ALT, ALKPHOS, BILITOT, PROT, ALBUMIN in the last 168 hours. No results for input(s): LIPASE, AMYLASE in the last 168 hours. No results for input(s): AMMONIA in the last 168 hours.  ABG    Component Value Date/Time   PHART 7.307 (L) 02/25/2019 1524   PCO2ART 41.1 02/25/2019 1524   PO2ART 305.0 (H) 02/25/2019 1524   HCO3 20.6 02/25/2019 1524   TCO2 22 02/25/2019 1524   ACIDBASEDEF 5.0 (H) 02/25/2019 1524   O2SAT 100.0 02/25/2019 1524     Coagulation Profile: No results for input(s): INR, PROTIME in the last 168 hours.  Cardiac Enzymes: No results for input(s): CKTOTAL, CKMB, CKMBINDEX, TROPONINI in the last 168 hours.  HbA1C: Hgb A1c MFr Bld  Date/Time Value Ref Range Status  07/30/2013 05:40 PM 6.5 (H) <5.7 % Final    Comment:    (NOTE)                                                                       According to the ADA Clinical Practice Recommendations for 2011, when HbA1c is used as a screening test:  >=6.5%   Diagnostic of Diabetes Mellitus           (if abnormal result is confirmed) 5.7-6.4%   Increased risk of developing Diabetes Mellitus  References:Diagnosis and Classification of Diabetes Mellitus,Diabetes UYQI,3474,25(ZDGLO 1):S62-S69 and Standards of Medical Care in         Diabetes - 2011,Diabetes VFIE,3329,51 (Suppl 1):S11-S61.  06/08/2009 06:05 PM (H) 4.6 - 6.1 % Final   6.2 (NOTE) The ADA recommends the following therapeutic goal for glycemic control related to Hgb A1c measurement: Goal of therapy: <6.5 Hgb A1c  Reference: American Diabetes Association: Clinical Practice Recommendations 2010, Diabetes Care, 2010, 33: (Suppl  1).    CBG: No results for input(s): GLUCAP in the last 168 hours.    Baltazar Apo, MD, PhD 10/15/2019, 1:32 PM  Pulmonary  and Critical Care [phone] or if no answer [phone]

## 2019-10-15 NOTE — Procedures (Signed)
Thoracentesis Procedure Note  Pre-operative Diagnosis: Loculated Pleural Effusion  Post-operative Diagnosis: same  Indications: Progressive Loculated pleural effusion  Procedure Details   Consent: Informed consent was obtained. Risks of the procedure were discussed including: infection, bleeding, pain, pneumothorax.  The effusion was located and marked with ultrasound. Under sterile conditions the patient was positioned. Chlorhexidine and sterile drapes were utilized.  1% buffered lidocaine was used to anesthetize the skin . Fluid was obtained without any difficulties and minimal blood loss.  A dressing was applied to the wound and wound care instructions were provided.   Findings 40 ml of serosanguinous pleural fluid was obtained. A sample was sent to Pathology for cytogenetics, and cell counts, as well as for infection analysis.  Complications:  None; patient tolerated the procedure well.        Condition: stable  Plan A follow up chest x-ray was ordered and pending at the time of this note.

## 2019-10-15 NOTE — Progress Notes (Signed)
Progress Note  Patient Name: Glen Green Date of Encounter: 10/15/2019  Primary Cardiologist: Candee Furbish, MD   Subjective   Pt feels well and is hoping for discharge soon.  Inpatient Medications    Scheduled Meds:  aspirin EC  81 mg Oral Daily   atorvastatin  40 mg Oral q1800   carvedilol  12.5 mg Oral BID WC   enoxaparin (LOVENOX) injection  40 mg Subcutaneous Q24H   furosemide  40 mg Oral BID   Ipratropium-Albuterol  1 puff Inhalation Daily   sodium chloride flush  3 mL Intravenous Q12H   Continuous Infusions:  sodium chloride     PRN Meds: sodium chloride, acetaminophen, albuterol, guaiFENesin, ondansetron (ZOFRAN) IV, sodium chloride flush   Vital Signs    Vitals:   10/14/19 1451 10/14/19 1635 10/14/19 2015 10/15/19 0427  BP: (!) 92/53 (!) 91/55 91/77 94/65   Pulse: 75  73 76  Resp:      Temp: (!) 97.5 F (36.4 C)  97.7 F (36.5 C) 97.9 F (36.6 C)  TempSrc: Oral  Oral Oral  SpO2: 91% 94% 96% 93%  Weight:    58.3 kg  Height:        Intake/Output Summary (Last 24 hours) at 10/15/2019 0706 Last data filed at 10/15/2019 0205 Gross per 24 hour  Intake 440 ml  Output 450 ml  Net -10 ml   Last 3 Weights 10/15/2019 10/14/2019 10/13/2019  Weight (lbs) 128 lb 9.6 oz 128 lb 12.8 oz 136 lb 11 oz  Weight (kg) 58.333 kg 58.423 kg 62 kg      Telemetry    Sinus rhythm HR in the 70s - Personally Reviewed  ECG    No new tracings - Personally Reviewed  Physical Exam   GEN: No acute distress.   Neck: No JVD Cardiac: RRR, + murmur Respiratory: respirations unlabored, diminished on right GI: Soft, nontender, non-distended  MS: No edema; No deformity. Neuro:  Nonfocal  Psych: Normal affect   Labs    High Sensitivity Troponin:   Recent Labs  Lab 10/13/19 0602 10/13/19 0817  TROPONINIHS 19* 21*      Chemistry Recent Labs  Lab 10/13/19 0614 10/14/19 0518 10/15/19 0313  NA 142 139 138  K 4.9 4.6 4.1  CL 111 103 102  CO2 21* 23 26    GLUCOSE 98 79 90  BUN 33* 39* 44*  CREATININE 1.53* 1.64* 1.59*  CALCIUM 9.2 9.1 8.9  GFRNONAA 43* 40* 41*  GFRAA 50* 46* 48*  ANIONGAP 10 13 10      Hematology Recent Labs  Lab 10/13/19 0614 10/14/19 0518  WBC 5.5 6.2  RBC 3.92* 4.36  HGB 12.4* 13.7  HCT 40.6 42.1  MCV 103.6* 96.6  MCH 31.6 31.4  MCHC 30.5 32.5  RDW 18.2* 17.2*  PLT 183 195    BNP Recent Labs  Lab 10/13/19 0615  BNP 1,143.3*     DDimer No results for input(s): DDIMER in the last 168 hours.   Radiology    Ct Chest Wo Contrast  Result Date: 10/14/2019 CLINICAL DATA:  Acute respiratory illness. EXAM: CT CHEST WITHOUT CONTRAST TECHNIQUE: Multidetector CT imaging of the chest was performed following the standard protocol without IV contrast. COMPARISON:  December 23, 2018 FINDINGS: Cardiovascular: The heart size is enlarged. Ascending aorta is aneurysmal measuring approximately 4.3 cm in diameter. The main pulmonary artery is borderline dilated measuring approximately 3.1 cm in diameter. Advanced coronary artery calcifications are noted. Atherosclerotic changes are noted of  the thoracic aorta. Mediastinum/Nodes: There are mildly enlarged mediastinal lymph nodes. These are relatively stable from prior study. The patient is status post prior soft ejected me. Lungs/Pleura: Moderate severe emphysematous changes are again noted. There is persistent architectural distortion of the left upper lung field. There is a multiloculated right-sided pleural effusion. The pleural fluid courses along the major and minor fissures. The effusion has increased in size since the prior study. Multiple pleural base calcified nodules are noted at the right lung base. There are few calcified pleural based plaques at the right lung base. Upper Abdomen: There are extensive postsurgical changes in the upper abdomen there are stable from prior study. No acute abnormality detected in the upper abdomen. Again noted is an exophytic nodule rising  from the posterior interpolar region of the left kidney. This nodule has increased in size slightly from prior study in 2019. Right-sided nephrolithiasis is noted. Musculoskeletal: There is a chronic appearing Hill-Sachs deformity involving the proximal left humerus. IMPRESSION: 1. Evaluation is limited by lack of IV contrast. 2. Interval increase in size of a multiloculated right-sided pleural effusion. The effusion courses along the major and minor fissures. Given the patient's history of malignancy, sampling of this pleural fluid may be prudent for further evaluation. 3. Moderate to severe bilateral emphysematous changes. 4. Aneurysmal dilatation of the ascending aorta currently measuring approximately 4.3 cm. Recommend annual imaging followup by CTA or MRA. This recommendation follows 2010 ACCF/AHA/AATS/ACR/ASA/SCA/SCAI/SIR/STS/SVM Guidelines for the Diagnosis and Management of Patients with Thoracic Aortic Disease. Circulation. 2010; 121: N462-V035. Aortic aneurysm NOS (ICD10-I71.9) 5. Mild mediastinal and hilar adenopathy, similar to prior study. This is presumably reactive. 6. Postsurgical changes related to prior esophagectomy. 7. Slight interval growth of an exophytic nodule rising from the posterior interpolar region of the left kidney. This is consistent with renal cell carcinoma until proven otherwise. 8. Right-sided nephrolithiasis. Aortic Atherosclerosis (ICD10-I70.0) and Emphysema (ICD10-J43.9). Electronically Signed   By: Constance Holster M.D.   On: 10/14/2019 23:08    Cardiac Studies   Echo 10/13/2019 IMPRESSIONS  1. Left ventricular ejection fraction, by visual estimation, is 40 to 45%. The left ventricle has mild to moderately decreased function. There is no left ventricular hypertrophy. 2. Left ventricular diastolic function could not be evaluated. 3. Global right ventricle has normal systolic function.The right ventricular size is normal. 4. Left atrial size was mildly  dilated. 5. Right atrial size was normal. 6. Mild mitral annular calcification. 7. The mitral valve is normal in structure. Severe mitral valve regurgitation. No evidence of mitral stenosis. 8. The tricuspid valve is normal in structure. Tricuspid valve regurgitation is severe. 9. The aortic valve is tricuspid. Aortic valve regurgitation is not visualized. Mild to moderate aortic valve sclerosis/calcification without any evidence of aortic stenosis. 10. The pulmonic valve was normal in structure. Pulmonic valve regurgitation is mild. 11. Moderately elevated pulmonary artery systolic pressure. 12. The inferior vena cava is dilated in size with >50% respiratory variability, suggesting right atrial pressure of 8 mmHg. 13. Mild to moderate global reduction in LV systolic function; sclerotic aortic valve; severe, posteriorly directed MR; mild LAE; severe TR; moderate pulmonary hypertension.  FINDINGS Left Ventricle: Left ventricular ejection fraction, by visual estimation, is 40 to 45%. The left ventricle has mild to moderately decreased function. There is no left ventricular hypertrophy. Left ventricular diastolic function could not be evaluated.  Normal left atrial pressure.  Patient Profile     78 y.o. male with PMH of combined systolic and diastolic CHF, CAD s/p CABG  2014, post op afib, prior lung CA and esophageal CA admitted with acute on chronic combined CHF. He was originally scheduled to undergo LE angio by Dr. Trula Slade on 11/3, but was admitted with SOB. CXR demonstrated pulmonary edema, R pleural effusion and mass like appearance in R middle lung. Awaiting input from PCCM.  Assessment & Plan    1. Acute on chronic systolic and diastolic heart failure 2. Ischemic cardiomyopathy - EF this admission 40-45% with severe MR and severe TR - lasix was transitioned from IV to PO dosing yesterday - he continued to diurese with a weight stable at 128 lbs - he is overall net negative, but  I&Os appear incomplete - pressures marginal - continue 40 mg lasix BID - continue coreg 12.5 mg BID    2. Severe MR, severe TR - will repeat limited echo in the OP setting to re-evaluate MR when the patient is euvolemic   3. CAD s/p CABG - ASA, stating, coreg - ACEI held for diuresis - plavix on hold for possible biopsy   4. Right pleural effusion 5. Mass-like appearance in right mid lung - consulted PCCM for possible thoracentesis today, awaiting recommendations - CT chest yesterday with loculated pleural effusion - plavix on hold for possible biopsy of loculated fluid   6. HTN - pressures have been marginal to hypotensive this admission - he is asymptomatic - OK for lasix and BB this morning 2 hrs apart   7. PAD - reschedule angiography of LE depending on when he can take plavix again and possible biopsy of pleural fluid   8. AAA s/p EVAR - 4.3 cm, was 4.1 cm in 06/17/19 - recommend annual imaging      For questions or updates, please contact [facility] Please consult [email or web address] for contact info under        Signed, Ledora Bottcher, PA  10/15/2019, 7:06 AM

## 2019-10-15 NOTE — Plan of Care (Signed)
  Problem: Education: Goal: Ability to demonstrate management of disease process will improve Outcome: Progressing Goal: Ability to verbalize understanding of medication therapies will improve Outcome: Progressing   Problem: Activity: Goal: Capacity to carry out activities will improve Outcome: Progressing   Problem: Cardiac: Goal: Ability to achieve and maintain adequate cardiopulmonary perfusion will improve Outcome: Progressing   Problem: Education: Goal: Knowledge of General Education information will improve Description: Including pain rating scale, medication(s)/side effects and non-pharmacologic comfort measures Outcome: Progressing   Problem: Clinical Measurements: Goal: Ability to maintain clinical measurements within normal limits will improve Outcome: Progressing Goal: Will remain free from infection Outcome: Progressing Goal: Diagnostic test results will improve Outcome: Progressing Goal: Respiratory complications will improve Outcome: Progressing Goal: Cardiovascular complication will be avoided Outcome: Progressing   Problem: Safety: Goal: Ability to remain free from injury will improve Outcome: Progressing

## 2019-10-16 ENCOUNTER — Other Ambulatory Visit: Payer: Self-pay | Admitting: Cardiology

## 2019-10-16 DIAGNOSIS — Z8501 Personal history of malignant neoplasm of esophagus: Secondary | ICD-10-CM

## 2019-10-16 DIAGNOSIS — I2510 Atherosclerotic heart disease of native coronary artery without angina pectoris: Secondary | ICD-10-CM

## 2019-10-16 LAB — AFP TUMOR MARKER: AFP, Serum, Tumor Marker: 1.9 ng/mL (ref 0.0–8.3)

## 2019-10-16 LAB — PH, BODY FLUID: pH, Body Fluid: 7.6

## 2019-10-16 LAB — BASIC METABOLIC PANEL
Anion gap: 9 (ref 5–15)
BUN: 38 mg/dL — ABNORMAL HIGH (ref 8–23)
CO2: 26 mmol/L (ref 22–32)
Calcium: 9.3 mg/dL (ref 8.9–10.3)
Chloride: 100 mmol/L (ref 98–111)
Creatinine, Ser: 1.42 mg/dL — ABNORMAL HIGH (ref 0.61–1.24)
GFR calc Af Amer: 55 mL/min — ABNORMAL LOW (ref >60)
GFR calc non Af Amer: 47 mL/min — ABNORMAL LOW (ref >60)
Glucose, Bld: 101 mg/dL — ABNORMAL HIGH (ref 70–99)
Potassium: 4 mmol/L (ref 3.5–5.1)
Sodium: 135 mmol/L (ref 135–145)

## 2019-10-16 MED ORDER — CARVEDILOL 12.5 MG PO TABS: 12.5000 mg | ORAL_TABLET | Freq: Two times a day (BID) | ORAL | 1 refills | Status: DC

## 2019-10-16 MED ORDER — FUROSEMIDE 40 MG PO TABS: 40.0000 mg | ORAL_TABLET | Freq: Every day | ORAL | 1 refills | Status: DC

## 2019-10-16 MED ORDER — FUROSEMIDE 40 MG PO TABS: 40.0000 mg | ORAL_TABLET | Freq: Every day | ORAL | 3 refills | Status: DC

## 2019-10-16 NOTE — Progress Notes (Signed)
Patient discharged in stable condition with wife to private vehicle in wheelchair

## 2019-10-16 NOTE — Progress Notes (Signed)
I spoke with Dr. Trula Slade and he stated there was not any intention to do any procedure on his leg while he is here in the hospital. Will make Primary MD aware

## 2019-10-16 NOTE — Care Management Important Message (Signed)
Important Message  Patient Details  Name: Glen Green MRN: [id number] Date of Birth: December 31, 1940   Medicare Important Message Given:  Yes     Shelda Altes 10/16/2019, 2:54 PM

## 2019-10-16 NOTE — Progress Notes (Signed)
Physical Therapy Treatment Patient Details Name: Glen Green MRN: [id number] DOB: 1941/10/23 Today's Date: 10/16/2019    History of Present Illness 78 yo male admitted to ED on 11/3 with acute on chronic CHF exacerbation with associated shortness of breath. Pt with R pleural effusion, vs malignancy. Pt additionally with LLE claudication scheduled for angioplasty on 11/3, expected to be performed prior to d/c. PMH includes lung cancer s/p lobectomy, afib, adrenal carcinoma s/p adrenalectomy 2005, esophegeal cancer s/p esophogastrectomy, SBO, CVA without residual deficits, SBO, brain aneurysm s/p repair.    PT Comments    Patient received in bed, wife present. Pleasant, talkative, agrees to walk. Patient wants to get home. He is independent with bed mobility and transfers. Ambulated 400 feet without AD, no LOB, O2 sats at 97% after walking 200 feet. Mild SOB reported. Patient will benefit from continued skilled PT to improve activity tolerance.        Follow Up Recommendations  No PT follow up     Equipment Recommendations  None recommended by PT    Recommendations for Other Services       Precautions / Restrictions Precautions Precautions: Fall Precaution Comments: mod fall Restrictions Weight Bearing Restrictions: No    Mobility  Bed Mobility Overal bed mobility: Modified Independent Bed Mobility: Supine to Sit;Sit to Supine     Supine to sit: Modified independent (Device/Increase time);HOB elevated Sit to supine: Modified independent (Device/Increase time);HOB elevated      Transfers Overall transfer level: Modified independent               General transfer comment: transfered sit to stand without assist  Ambulation/Gait Ambulation/Gait assistance: Supervision Gait Distance (Feet): 400 Feet Assistive device: None Gait Pattern/deviations: Step-through pattern Gait velocity: WNL       Stairs             Wheelchair Mobility    Modified Rankin  (Stroke Patients Only)       Balance Overall balance assessment: Independent Sitting-balance support: Feet supported Sitting balance-Leahy Scale: Normal     Standing balance support: No upper extremity supported;During functional activity Standing balance-Leahy Scale: Good Standing balance comment: no LOB or difficulties noted                            Cognition Arousal/Alertness: Awake/alert Behavior During Therapy: WFL for tasks assessed/performed Overall Cognitive Status: Within Functional Limits for tasks assessed                                        Exercises      General Comments        Pertinent Vitals/Pain Pain Assessment: No/denies pain    Home Living                      Prior Function            PT Goals (current goals can now be found in the care plan section) Acute Rehab PT Goals Patient Stated Goal: go home, back to golfing PT Goal Formulation: With patient Time For Goal Achievement: 10/28/19 Potential to Achieve Goals: Good Progress towards PT goals: Progressing toward goals    Frequency    Min 3X/week      PT Plan      Co-evaluation  AM-PAC PT "6 Clicks" Mobility   Outcome Measure  Help needed turning from your back to your side while in a flat bed without using bedrails?: None Help needed moving from lying on your back to sitting on the side of a flat bed without using bedrails?: None Help needed moving to and from a bed to a chair (including a wheelchair)?: None Help needed standing up from a chair using your arms (e.g., wheelchair or bedside chair)?: None Help needed to walk in hospital room?: None Help needed climbing 3-5 steps with a railing? : A Little 6 Click Score: 23    End of Session Equipment Utilized During Treatment: Gait belt Activity Tolerance: Patient tolerated treatment well Patient left: in bed;with call bell/phone within reach;with family/visitor  present Nurse Communication: Mobility status PT Visit Diagnosis: Muscle weakness (generalized) (M62.81)     Time: 1188-6773 PT Time Calculation (min) (ACUTE ONLY): 14 min  Charges:  $Gait Training: 8-22 mins                     Ardell Makarewicz, PT, GCS 10/16/19,10:42 AM

## 2019-10-16 NOTE — Discharge Summary (Signed)
Discharge Summary    Patient ID: Glen Green MRN: [id number]; DOB: [birth year]  Admit date: 10/13/2019 Discharge date: 10/16/2019  Primary Care Provider: Alroy Dust, L.Marlou Sa, MD  Primary Cardiologist: Candee Furbish, MD  Primary Electrophysiologist:  None   Discharge Diagnoses    Principal Problem:   Acute on chronic combined systolic and diastolic CHF (congestive heart failure) ([place]) Active Problems:   Dyslipidemia   History of esophageal cancer   Hypothyroidism   History of lung cancer   AAA (abdominal aortic aneurysm) ([place])   Essential hypertension, benign   Pleural effusion   S/P AAA repair   Severe mitral regurgitation    Diagnostic Studies/Procedures    Echo 10/13/19 IMPRESSIONS  1. Left ventricular ejection fraction, by visual estimation, is 40 to 45%. The left ventricle has mild to moderately decreased function. There is no left ventricular hypertrophy. 2. Left ventricular diastolic function could not be evaluated. 3. Global right ventricle has normal systolic function.The right ventricular size is normal. 4. Left atrial size was mildly dilated. 5. Right atrial size was normal. 6. Mild mitral annular calcification. 7. The mitral valve is normal in structure. Severe mitral valve regurgitation. No evidence of mitral stenosis. 8. The tricuspid valve is normal in structure. Tricuspid valve regurgitation is severe. 9. The aortic valve is tricuspid. Aortic valve regurgitation is not visualized. Mild to moderate aortic valve sclerosis/calcification without any evidence of aortic stenosis. 10. The pulmonic valve was normal in structure. Pulmonic valve regurgitation is mild. 11. Moderately elevated pulmonary artery systolic pressure. 12. The inferior vena cava is dilated in size with >50% respiratory variability, suggesting right atrial pressure of 8 mmHg. 13. Mild to moderate global reduction in LV systolic function; sclerotic aortic valve; severe, posteriorly  directed MR; mild LAE; severe TR; moderate pulmonary hypertension. _____________   History of Present Illness     Glen Green is a 78 y.o. male with a hx of lung and esophageal cancer s/p esophagectomy (2004) and left upper lobectomy, AAA s/p FEVAR, CVA, CAD s/p CABG x2, and emphysema was admitted to [facility] for evaluation of SOB. Cardiology was consulted.  Glen Green reports that for the past 3 days he has been having worsening shortness of breath, difficulty laying flat, and waking up with shortness of breath. He was planned to have an angiogram on 11/3 for LLE claudication with identified popliteal lesion however his SOB continued to worsen so he came to the ED. He reports that he has been feeling generally unwell since March however about 3 days ago he started having worsening shortness of breath, worse with lying down, reported two-pillow orthopnea, and PND.  He denied any chest pain, palpitation, fevers, chills, nausea, vomiting, headaches, lightheadedness, dizziness.  He does endorse a decreased appetite for a few weeks, endorses drinking more water recently.  Denied any recent travel or recent sick contacts.  He reported taking his medications as prescribed.  He denied smoking, alcohol, and drug use.  He lives with his wife and her sister in [place].  Worked as a Psychologist, educational but retired in 2004.  Hospital Course     Consultants: none  Patient was admitted to medicine service and cardiology asked to consult. Diagnosis of supratherapeutic INR is incorrect, INR was 1.1 on admission and the patient is not taking coumadin.    Acute on chronic systolic and diastolic heart failure Echo this admission with EF of 40-45%, down from 45-50% in 03/2018. BNP was elevated to 1143. ACEI held for renal function. He diuresed  well with IV and PO lasix. Home lasix was increased to 40 mg daily. Educated on sodium and fluid restriction. Discharge weight is 125 lbs, down from 137 lbs on admission.     CKD stage III sCr 1.42 today, down from a peak of 1.64. Will need BMP in 1 week.   CAD s/p CABG 2353 Complicated by post-op Afib Home medications include: ASA, plavix, lipitor, lisinopril, and coreg. ACEI held due to reduced renal function. No chest pain this admission.   Severe MR/TR Noted on echo this admission. Will plan to repeat echo in 1-2 months.    PAD Scheduled for LE angiography for claudication - missed this due to hospitalization. Will be rescheduled with Dr. Trula Slade in 3 weeks. Continue ASA, plavix, and lipitor.   AAA s/p FEVAR CT chest measured 4.3 cm this admission. Plan to repeat CT in 1 year.    Prior lung cancer, esophageal cancer Right pleural effusion Mass on CXR Underwent thoracentesis on 10/15/19. Cytology pending - will be followed by Dr. Lamonte Sakai as an outpatient.    Hyperlipidemia 10/14/2019: Cholesterol 129; HDL 42; LDL Cholesterol 69; Triglycerides 90; VLDL 18 Continue statin   COPD Discharge on home meds.   CT with increase in growth in left kidney nodule Needs to follow up with PCP on this. May need nephrology referral.    Pt seen and examined by Dr. Oval Linsey and deemed stable for discharge. Messages sent for cardiology follow up.   Did the patient have an acute coronary syndrome (MI, NSTEMI, STEMI, etc) this admission?:  No                               Did the patient have a percutaneous coronary intervention (stent / angioplasty)?:  No.   _____________  Discharge Vitals Blood pressure 102/71, pulse 86, temperature (!) 97.5 F (36.4 C), temperature source Oral, resp. rate 20, height 5\' 10"  (1.778 m), weight 56.7 kg, SpO2 97 %.  Filed Weights   10/14/19 0434 10/15/19 0427 10/16/19 0300  Weight: 58.4 kg 58.3 kg 56.7 kg    Labs & Radiologic Studies    CBC Recent Labs    10/14/19 0518  WBC 6.2  NEUTROABS 4.3  HGB 13.7  HCT 42.1  MCV 96.6  PLT 614   Basic Metabolic Panel Recent Labs    10/15/19 0313 10/16/19 0306  NA 138  135  K 4.1 4.0  CL 102 100  CO2 26 26  GLUCOSE 90 101*  BUN 44* 38*  CREATININE 1.59* 1.42*  CALCIUM 8.9 9.3   Liver Function Tests No results for input(s): AST, ALT, ALKPHOS, BILITOT, PROT, ALBUMIN in the last 72 hours. No results for input(s): LIPASE, AMYLASE in the last 72 hours. High Sensitivity Troponin:   Recent Labs  Lab 10/13/19 0602 10/13/19 0817  TROPONINIHS 19* 21*    BNP Invalid input(s): POCBNP D-Dimer No results for input(s): DDIMER in the last 72 hours. Hemoglobin A1C No results for input(s): HGBA1C in the last 72 hours. Fasting Lipid Panel Recent Labs    10/14/19 0518  CHOL 129  HDL 42  LDLCALC 69  TRIG 90  CHOLHDL 3.1   Thyroid Function Tests No results for input(s): TSH, T4TOTAL, T3FREE, THYROIDAB in the last 72 hours.  Invalid input(s): FREET3 _____________  Dg Chest 1 View  Result Date: 10/15/2019 CLINICAL DATA:  Status post RIGHT thoracentesis. EXAM: CHEST  1 VIEW COMPARISON:  10/13/2019 FINDINGS: There is been  slight decrease in RIGHT pleural effusion. No pneumothorax noted. Fluid within the RIGHT minor fissure is again noted. A small LEFT pleural effusion is again identified. Cardiomegaly and CABG changes noted. IMPRESSION: Slight decrease in RIGHT pleural effusion.  No pneumothorax. Electronically Signed   By: Margarette Canada M.D.   On: 10/15/2019 18:06   Ct Chest Wo Contrast  Result Date: 10/14/2019 CLINICAL DATA:  Acute respiratory illness. EXAM: CT CHEST WITHOUT CONTRAST TECHNIQUE: Multidetector CT imaging of the chest was performed following the standard protocol without IV contrast. COMPARISON:  December 23, 2018 FINDINGS: Cardiovascular: The heart size is enlarged. Ascending aorta is aneurysmal measuring approximately 4.3 cm in diameter. The main pulmonary artery is borderline dilated measuring approximately 3.1 cm in diameter. Advanced coronary artery calcifications are noted. Atherosclerotic changes are noted of the thoracic aorta.  Mediastinum/Nodes: There are mildly enlarged mediastinal lymph nodes. These are relatively stable from prior study. The patient is status post prior soft ejected me. Lungs/Pleura: Moderate severe emphysematous changes are again noted. There is persistent architectural distortion of the left upper lung field. There is a multiloculated right-sided pleural effusion. The pleural fluid courses along the major and minor fissures. The effusion has increased in size since the prior study. Multiple pleural base calcified nodules are noted at the right lung base. There are few calcified pleural based plaques at the right lung base. Upper Abdomen: There are extensive postsurgical changes in the upper abdomen there are stable from prior study. No acute abnormality detected in the upper abdomen. Again noted is an exophytic nodule rising from the posterior interpolar region of the left kidney. This nodule has increased in size slightly from prior study in 2019. Right-sided nephrolithiasis is noted. Musculoskeletal: There is a chronic appearing Hill-Sachs deformity involving the proximal left humerus. IMPRESSION: 1. Evaluation is limited by lack of IV contrast. 2. Interval increase in size of a multiloculated right-sided pleural effusion. The effusion courses along the major and minor fissures. Given the patient's history of malignancy, sampling of this pleural fluid may be prudent for further evaluation. 3. Moderate to severe bilateral emphysematous changes. 4. Aneurysmal dilatation of the ascending aorta currently measuring approximately 4.3 cm. Recommend annual imaging followup by CTA or MRA. This recommendation follows 2010 ACCF/AHA/AATS/ACR/ASA/SCA/SCAI/SIR/STS/SVM Guidelines for the Diagnosis and Management of Patients with Thoracic Aortic Disease. Circulation. 2010; 121: H371-I967. Aortic aneurysm NOS (ICD10-I71.9) 5. Mild mediastinal and hilar adenopathy, similar to prior study. This is presumably reactive. 6. Postsurgical  changes related to prior esophagectomy. 7. Slight interval growth of an exophytic nodule rising from the posterior interpolar region of the left kidney. This is consistent with renal cell carcinoma until proven otherwise. 8. Right-sided nephrolithiasis. Aortic Atherosclerosis (ICD10-I70.0) and Emphysema (ICD10-J43.9). Electronically Signed   By: Constance Holster M.D.   On: 10/14/2019 23:08   Dg Chest Portable 1 View  Result Date: 10/13/2019 CLINICAL DATA:  Shortness of breath EXAM: PORTABLE CHEST 1 VIEW COMPARISON:  February 25, 2019 FINDINGS: Heart size is enlarged. Aortic calcifications are noted. The patient is status post prior median sternotomy. There is a rounded density overlying the right mid lung zone favored to represent a loculated right-sided pleural effusion. Emphysematous changes are noted bilaterally. There are bibasilar airspace opacities which may represent atelectasis or infiltrate. IMPRESSION: 1. Loculated right-sided pleural effusion. There is a masslike appearance of this pleural fluid in the right mid lung zone. This is similar to prior CT from July 2020. 2. Small left-sided pleural effusion. 3. Bibasilar airspace opacities favored to represent atelectasis,  however an infiltrate is not excluded. Electronically Signed   By: Constance Holster M.D.   On: 10/13/2019 06:23   Vas [place] Burnard Bunting With/wo Tbi  Result Date: 10/09/2019 LOWER EXTREMITY DOPPLER STUDY Indications: Claudication. High Risk Factors: Hypertension, hyperlipidemia, past history of smoking, prior                    MI, coronary artery disease. Other Factors: Sudden severe left calf claudication 2 days prior.  Vascular Interventions: EVAR 02/25/2019. Performing Technologist: Delorise Shiner RVT  Examination Guidelines: A complete evaluation includes at minimum, Doppler waveform signals and systolic blood pressure reading at the level of bilateral brachial, anterior tibial, and posterior tibial arteries, when vessel segments are  accessible. Bilateral testing is considered an integral part of a complete examination. Photoelectric Plethysmograph (PPG) waveforms and toe systolic pressure readings are included as required and additional duplex testing as needed. Limited examinations for reoccurring indications may be performed as noted.  ABI Findings: +---------+------------------+-----+----------+--------+ Right    Rt Pressure (mmHg)IndexWaveform  Comment  +---------+------------------+-----+----------+--------+ Brachial 108                                       +---------+------------------+-----+----------+--------+ ATA      72                0.67                    +---------+------------------+-----+----------+--------+ PTA      71                0.66 monophasic         +---------+------------------+-----+----------+--------+ DP                              monophasic         +---------+------------------+-----+----------+--------+ Great Toe0                 0.00                    +---------+------------------+-----+----------+--------+ +---------+------------------+-----+----------+-------+ Left     Lt Pressure (mmHg)IndexWaveform  Comment +---------+------------------+-----+----------+-------+ Brachial 103                                      +---------+------------------+-----+----------+-------+ ATA      52                0.48                   +---------+------------------+-----+----------+-------+ PTA      69                0.64 monophasic        +---------+------------------+-----+----------+-------+ DP                              monophasic        +---------+------------------+-----+----------+-------+ Great Toe6                 0.06                   +---------+------------------+-----+----------+-------+ +-------+-----------+-----------+------------+------------+ ABI/TBIToday's ABIToday's TBIPrevious ABIPrevious TBI  +-------+-----------+-----------+------------+------------+ Right  0.67       0.0  0.70        0.55         +-------+-----------+-----------+------------+------------+ Left   0.64       0.06       0.85        0.61         +-------+-----------+-----------+------------+------------+ Right ABIs appear essentially unchanged compared to prior study on 03/10/2018. Left ABIs appear decreased compared to prior study on 03/10/2018.  Summary: Right: Resting right ankle-brachial index indicates moderate right lower extremity arterial disease. The right toe-brachial index is abnormal. RT great toe pressure = 0 mmHg. Left: Resting left ankle-brachial index indicates moderate left lower extremity arterial disease. The left toe-brachial index is abnormal. LT Great toe pressure = 6 mmHg.  *See table(s) above for measurements and observations.  Electronically signed by Servando Snare MD on 10/09/2019 at 4:14:12 PM.    Final    Vas [place] Lower Extremity Arterial Duplex  Result Date: 09/30/2019 LOWER EXTREMITY ARTERIAL DUPLEX STUDY Indications: Claudication, and peripheral artery disease. High Risk Factors: Hypertension, hyperlipidemia, past history of smoking, prior                    MI, coronary artery disease. Other Factors: Sudden severe left calf claudication 2 days prior.  Vascular Interventions: EVAR 02/25/2019. Current ABI:            Right: 0.67, Left: 0.64 Performing Technologist: Delorise Shiner RVT  Examination Guidelines: A complete evaluation includes B-mode imaging, spectral Doppler, color Doppler, and power Doppler as needed of all accessible portions of each vessel. Bilateral testing is considered an integral part of a complete examination. Limited examinations for reoccurring indications may be performed as noted.  +----------+--------+-----+--------+----------+---------+ RIGHT     PSV cm/sRatioStenosisWaveform  Comments  +----------+--------+-----+--------+----------+---------+ CFA  Distal99                   triphasic           +----------+--------+-----+--------+----------+---------+ DFA       85                   biphasic            +----------+--------+-----+--------+----------+---------+ SFA Prox  58                   biphasic            +----------+--------+-----+--------+----------+---------+ SFA Mid   43                   biphasic            +----------+--------+-----+--------+----------+---------+ SFA Distal42                   monophasic          +----------+--------+-----+--------+----------+---------+ POP Prox  200                  monophasic          +----------+--------+-----+--------+----------+---------+ POP Distal14                   monophasicRetrgrade +----------+--------+-----+--------+----------+---------+ ATA Distal26                   monophasic          +----------+--------+-----+--------+----------+---------+ PTA Distal15                   monophasic          +----------+--------+-----+--------+----------+---------+ A focal velocity elevation of 200 cm/s  was obtained at DIstal SFA/ Prox pop with a VR of 4.8. Findings are characteristic of 75-99% stenosis.  +----------+--------+-----+--------+----------+--------+ LEFT      PSV cm/sRatioStenosisWaveform  Comments +----------+--------+-----+--------+----------+--------+ CFA Distal70                   triphasic          +----------+--------+-----+--------+----------+--------+ DFA       82                   biphasic           +----------+--------+-----+--------+----------+--------+ SFA Prox  65                   triphasic          +----------+--------+-----+--------+----------+--------+ SFA Mid   51                   biphasic           +----------+--------+-----+--------+----------+--------+ SFA Distal25                   monophasic         +----------+--------+-----+--------+----------+--------+ POP Prox  60                    biphasic           +----------+--------+-----+--------+----------+--------+ POP Distal424                  monophasic         +----------+--------+-----+--------+----------+--------+ ATA Distal14                   monophasic         +----------+--------+-----+--------+----------+--------+ PTA Distal18                   monophasic         +----------+--------+-----+--------+----------+--------+ A focal velocity elevation of 424 cm/s was obtained at Distal popliteal artery with a VR of 7.1. Findings are characteristic of 75-99% stenosis.  Summary: Right: 75-99% stenosis noted in the popliteal artery. Left: 75-99% stenosis noted in the popliteal artery.  See table(s) above for measurements and observations. Electronically signed by Deitra Mayo MD on 09/30/2019 at 4:44:41 PM.    Final    Vas [place] Lower Extremity Venous (dvt)  Result Date: 10/05/2019  Lower Venous Study Indications: Pain. Other Indications: Bilateral calf pain, left worse than right. Performing Technologist: Delorise Shiner RVT  Examination Guidelines: A complete evaluation includes B-mode imaging, spectral Doppler, color Doppler, and power Doppler as needed of all accessible portions of each vessel. Bilateral testing is considered an integral part of a complete examination. Limited examinations for reoccurring indications may be performed as noted.  +---------+---------------+---------+-----------+----------+--------------+ RIGHT    CompressibilityPhasicitySpontaneityPropertiesThrombus Aging +---------+---------------+---------+-----------+----------+--------------+ CFV      Full           Yes      Yes                                 +---------+---------------+---------+-----------+----------+--------------+ SFJ      Full                    Yes                                 +---------+---------------+---------+-----------+----------+--------------+ FV Prox  Full  Yes      Yes                                  +---------+---------------+---------+-----------+----------+--------------+ FV Mid   Full           Yes      Yes                                 +---------+---------------+---------+-----------+----------+--------------+ FV DistalFull           Yes      Yes                                 +---------+---------------+---------+-----------+----------+--------------+ PFV      Full                    Yes                                 +---------+---------------+---------+-----------+----------+--------------+ POP      Full           Yes      Yes                                 +---------+---------------+---------+-----------+----------+--------------+ PTV      Full                                                        +---------+---------------+---------+-----------+----------+--------------+ PERO     Full                                                        +---------+---------------+---------+-----------+----------+--------------+ GSV      Full                    Yes                                 +---------+---------------+---------+-----------+----------+--------------+ SSV      Full                    Yes                                 +---------+---------------+---------+-----------+----------+--------------+   +---------+---------------+---------+-----------+----------+--------------+ LEFT     CompressibilityPhasicitySpontaneityPropertiesThrombus Aging +---------+---------------+---------+-----------+----------+--------------+ CFV      Full           Yes      Yes                                 +---------+---------------+---------+-----------+----------+--------------+ SFJ      Full  Yes                                 +---------+---------------+---------+-----------+----------+--------------+ FV Prox  Full           Yes      Yes                                  +---------+---------------+---------+-----------+----------+--------------+ FV Mid   Full           Yes      Yes                                 +---------+---------------+---------+-----------+----------+--------------+ FV DistalFull           Yes      Yes                                 +---------+---------------+---------+-----------+----------+--------------+ PFV      Full           Yes      Yes                                 +---------+---------------+---------+-----------+----------+--------------+ POP      Full           Yes      Yes                                 +---------+---------------+---------+-----------+----------+--------------+ PTV      Full                                                        +---------+---------------+---------+-----------+----------+--------------+ PERO     Full                                                        +---------+---------------+---------+-----------+----------+--------------+ GSV      Full                    Yes                                 +---------+---------------+---------+-----------+----------+--------------+ SSV      Full                    Yes                                 +---------+---------------+---------+-----------+----------+--------------+     Summary: Right: There is no evidence of deep vein thrombosis in the lower extremity. There is no evidence of superficial venous thrombosis. No cystic structure found in the popliteal fossa. Left: There is no evidence of deep vein thrombosis in the  lower extremity. There is no evidence of superficial venous thrombosis. No cystic structure found in the popliteal fossa.  *See table(s) above for measurements and observations. Electronically signed by Harold Barban MD on 10/05/2019 at 10:19:37 AM.    Final    Disposition   Pt is being discharged home today in good condition.  Follow-up Plans & Appointments     Discharge Instructions     Diet - low sodium heart healthy   Complete by: As directed    Diet - low sodium heart healthy   Complete by: As directed    Increase activity slowly   Complete by: As directed    Increase activity slowly   Complete by: As directed       Discharge Medications   Allergies as of 10/16/2019      Reactions   Quinolones Other (See Comments)   Patient was warned about not using Cipro and similar antibiotics. Recent studies have raised concern that fluoroquinolone antibiotics could be associated with an increased risk of aortic aneurysm Fluoroquinolones have non-antimicrobial properties that might jeopardise the integrity of the extracellular matrix of the vascular wall In a  propensity score matched cohort study in [place], there was a 66% increased rate of aortic aneurysm or dissection associated with oral fluoroquinolone use, compared wit      Medication List    STOP taking these medications   lisinopril 5 MG tablet Commonly known as: ZESTRIL   ondansetron 4 MG disintegrating tablet Commonly known as: Zofran ODT     TAKE these medications   acetaminophen 500 MG tablet Commonly known as: TYLENOL Take 1,000 mg by mouth every 6 (six) hours as needed for mild pain or headache.   aspirin EC 81 MG tablet Take 1 tablet (81 mg total) by mouth daily.   atorvastatin 40 MG tablet Commonly known as: LIPITOR TAKE 1 TABLET BY MOUTH DAILY AT 6 PM What changed: See the new instructions.   carvedilol 12.5 MG tablet Commonly known as: COREG TAKE 1 TABLET (12.5 MG TOTAL) BY MOUTH 2 (TWO) TIMES DAILY What changed: See the new instructions.     clopidogrel 75 MG tablet Commonly known as: PLAVIX TAKE 1 TABLET BY MOUTH EVERY DAY   furosemide 40 MG tablet Commonly known as: LASIX Take 1 tablet (40 mg total) by mouth daily. What changed:   medication strength  how much to take  additional instructions      guaiFENesin 600 MG 12 hr tablet Commonly known as: MUCINEX Take 600 mg by  mouth 2 (two) times daily as needed for to loosen phlegm.   Stiolto Respimat 2.5-2.5 MCG/ACT Aers Generic drug: Tiotropium Bromide-Olodaterol INHALE 2 PUFFS BY MOUTH INTO THE LUNGS DAILY What changed: See the new instructions.          Outstanding Labs/Studies   BMP in 1 week  Echo in 1-2 months  Will follow up with [facility]  Follow up with PCP   Duration of Discharge Encounter   Greater than 30 minutes including physician time.  Signed, Tami Lin Duke, PA 10/16/2019, 1:47 PM

## 2019-10-16 NOTE — Progress Notes (Signed)
PROGRESS NOTE    Patient: Glen Green                            PCP: Alroy Dust, L.Marlou Sa, MD                    DOB: Jan 09, 1941            DOA: 10/13/2019 HYI:[id number]             DOS: 10/16/2019, 12:41 PM   LOS: 3 days   Date of Service: The patient was seen and examined on 10/16/2019  Subjective:   The patient was seen and examined this morning, remained stable, on room air denies any chest pain or shortness of breath. Status post successful fluoroscopy centesis, yielding 40 cc of pleural effusion, which has been sent for analysis. Stating that he had tolerated procedure well.  He remains confused regarding neck step of his work-up. Questioning if he will be discharged home today.  Brief Narrative:    78y.o.malewith PMH of combined systolic and diastolic CHF, CAD s/p CABG 2014, post op afib, prior lung CA and esophageal CA admitted with acute on chronic combined CHF. He was originally scheduled to undergo LE angio by Dr. Chaya Jan. CXR demonstrated pulmonary edema, R pleural effusion and mass like appearance in R middle lung.  Patient was subsequently admitted by cardiology.  Started on diuretics.  Due to lung nodule, renal nodule with history of lung cancer and esophageal cancer pulmonary team was consulted. Due to his extensive history of peripheral vascular disease pending work-up vascular surgeon was also consulted.  Patient was diuresed well, shortness of breath has improved, did not have any further chest pain or shortness of breath. Pulmonary team has obtained pleural fluid through thoracentesis for evaluation of fluid for possible malignancy.  Deferred bronchoscopy at this time.  On this admission patient was diuresed well with IV Lasix which was subsequently switched to p.o.  Aspirin Plavix was held for possible procedure.  Now the procedures has been on hold and follow-up as an outpatient resuming antiplatelet therapy.     Assessment & Plan:   Principal  Problem:   Acute on chronic combined systolic and diastolic CHF (congestive heart failure) (HCC) Active Problems:   Dyslipidemia   History of esophageal cancer   Hypothyroidism   History of lung cancer   AAA (abdominal aortic aneurysm) (HCC)   Essential hypertension, benign   Pleural effusion   S/P AAA repair   Severe mitral regurgitation -------------------------------------------------------------------------------------------------------------------------------------------------  Acute on chronic combined systolic/diastolic congestive heart failure --Patient remains stable denies any chest pain, shortness of breath now.  -Last 2D echocardiogram 10/13/2019 was reviewed ejection fracture 40-45%, severe MR/TR -Monitoring I's and O's,  - Daily weight 136.7 >>> 128.8 pounds -Currently appears euvolemic -Cardiology continue IV Lasix may switch to p.o. now   H/o Coronary artery disease, status post CABG/with severe MR/TR -Continue supportive care -Cardiology following -Currently on aspirin, statins, beta-blocker, (aspirin, Lipitor, Coreg) -Lisinopril on hold -Plavix on hold for long mass biopsy  Masslike appearance in the right mid lower lobe/history of lung and esophageal cancer/ Right pleural effusion -Imaging revealing right middle lobe mass/and pleural effusion -Pulmonary has consulted interventional radiologist-for thoracentesis -10/15/2019 status post IR thoracentesis 40 CC fluid has been sent in for analysis-pathology pending    -Pulmonologist Dr Minor  following for possible malignancy -Holding Plavix for possible biopsy (waiting for 5 days washout of Plavix before biopsy) -CT  angiogram on 06/17/2019 showing stable 1.4 cm mass left renal mass compared to 04/15/2019 -Ordered alpha-fetoproteinlevels  Left renal nodule -Patient needs to follow with a PCP, likely need a referral to nephrology for further evaluation   Peripheral artery disease -Plavix was on  hold as to above--will be resumed -Vascular team planning outpatient follow-up, angiography and further evaluation and treatment plan as an outpatient.  H/o Abdominal aortic aneurysm -Status post F EVAR -4.3 cm on CT on this admission cardiology recommending 1 year repeat scan.  Thoracic aortic aneurysm -CTA 06/17/2019 revealing 5.0 x 5.7 cm -Close monitoring, cardiology following ---     Code Status:Code Status: DNR Family Communication:No family member present at bedside- attempt will be made to update daily  Disposition Plan: To be discharged home with close follow-up with pulmonary regarding pulmonary nodules, thoracentesis fluid analysis for ruling it out as cancer, possible bronchoscopy as outpatient. Admission status:NPATIEN -   Consults: Cardiology Pulmonology Vascular surgery team Interventional radiology     Procedures:   Echo 10/13/2019 IMPRESSIONS FINDINGS Left Ventricle: Left ventricular ejection fraction, by visual estimation, is 40 to 45%. The left ventricle has mild to moderately decreased function. There is no left ventricular hypertrophy. Left ventricular diastolic function could not be evaluated.  Normal left atrial pressure. Antimicrobials:  Anti-infectives (From admission, onward)   None       Medication:  . aspirin EC  81 mg Oral Daily  . atorvastatin  40 mg Oral q1800  . carvedilol  12.5 mg Oral BID WC  . enoxaparin (LOVENOX) injection  40 mg Subcutaneous Q24H  . furosemide  40 mg Oral Daily  . sodium chloride flush  3 mL Intravenous Q12H  . umeclidinium-vilanterol  1 puff Inhalation Daily    sodium chloride, acetaminophen, guaiFENesin, Ipratropium-Albuterol, ondansetron (ZOFRAN) IV, sodium chloride flush   Objective:   Vitals:   10/16/19 0712 10/16/19 0900 10/16/19 1035 10/16/19 1112  BP: 97/78   102/71  Pulse:      Resp:      Temp:      TempSrc:      SpO2:  94% 97%   Weight:      Height:        Intake/Output  Summary (Last 24 hours) at 10/16/2019 1241 Last data filed at 10/16/2019 0600 Gross per 24 hour  Intake 700 ml  Output 400 ml  Net 300 ml   Filed Weights   10/14/19 0434 10/15/19 0427 10/16/19 0300  Weight: 58.4 kg 58.3 kg 56.7 kg     Examination:   Physical Exam  BP 102/71   Pulse 86   Temp (!) 97.5 F (36.4 C) (Oral)   Resp 20   Ht 5\' 10"  (1.778 m)   Wt 56.7 kg   SpO2 97%   BMI 17.95 kg/m    Physical Exam  Constitution:  Alert, cooperative, no distress,  Psychiatric: Normal and stable mood and affect, cognition intact,   HEENT: Normocephalic, PERRL, otherwise with in Normal limits  Chest:Chest symmetric Cardio vascular:  S1/S2, RRR, No murmure, No Rubs or Gallops  pulmonary: Clear to auscultation bilaterally, respirations unlabored, negative wheezes / crackles Abdomen: Soft, non-tender, non-distended, bowel sounds,no masses, no organomegaly Muscular skeletal: Limited exam - in bed, able to move all 4 extremities, Normal strength,  Neuro: CNII-XII intact. , normal motor and sensation, reflexes intact  Extremities: No pitting edema lower extremities, +2 pulses  Skin: Dry, warm to touch, negative for any Rashes, No open wounds Wounds: per nursing documentation  LABs:  CBC Latest Ref Rng & Units 10/14/2019 10/13/2019 02/27/2019  WBC 4.0 - 10.5 K/uL 6.2 5.5 10.9(H)  Hemoglobin 13.0 - 17.0 g/dL 13.7 12.4(L) 9.8(L)  Hematocrit 39.0 - 52.0 % 42.1 40.6 30.4(L)  Platelets 150 - 400 K/uL 195 183 112(L)   CMP Latest Ref Rng & Units 10/16/2019 10/15/2019 10/14/2019  Glucose 70 - 99 mg/dL 101(H) 90 79  BUN 8 - 23 mg/dL 38(H) 44(H) 39(H)  Creatinine 0.61 - 1.24 mg/dL 1.42(H) 1.59(H) 1.64(H)  Sodium 135 - 145 mmol/L 135 138 139  Potassium 3.5 - 5.1 mmol/L 4.0 4.1 4.6  Chloride 98 - 111 mmol/L 100 102 103  CO2 22 - 32 mmol/L 26 26 23   Calcium 8.9 - 10.3 mg/dL 9.3 8.9 9.1  Total Protein 6.5 - 8.1 g/dL - - -  Total Bilirubin 0.3 - 1.2 mg/dL - - -  Alkaline Phos 38 - 126 U/L  - - -  AST 15 - 41 U/L - - -  ALT 0 - 44 U/L - - -    SIGNED: Deatra James, MD, FACP, FHM. [facility],  Pager [phone]  If 7PM-7AM, please contact night-coverage [email or web address] 10/16/2019, 12:41 PM

## 2019-10-16 NOTE — Progress Notes (Signed)
Cardiology Office Note   Date:  10/22/2019   ID:  Glen Green, DOB 08-05-1941, MRN [id number]  PCP:  Aurea Graff.Marlou Sa, MD  Cardiologist: Dr. Candee Furbish, MD  Chief Complaint  Patient presents with  . Hospitalization Follow-up    History of Present Illness: Glen Green is a 78 y.o. male who presents for hospital follow-up, seen for Dr. Marlou Porch.  Mr. Murnane has a history of lung and esophageal cancer s/p esophagectomy (2004) and upper left lobectomy, AAA s/p FEVAR, CVA, CAD s/p CABG x2 and emphysema who was admitted to [facility] for the evaluation of shortness of breath.  On presentation, Mr. Savage had a 3-day history of worsening shortness of breath with orthopnea symptoms.  Plan was to have an angiogram on 10/13/2019 for LLE claudication with identified popliteal lesion however his shortness of breath continued to worsen therefore he came to the ED for further evaluation.  Echocardiogram performed 10/13/2019 with EF of 40 to 45%, down from 45 to 50% in 03/2018.  BNP was elevated at 1143.  ACEI was held for renal function.  He was diuresed with IV Lasix that was eventually transitioned to p.o.  Home Lasix was increased to 40 mg daily and he was educated on sodium and fluid restrictions.  Discharge weight was noted to be 125lbs, down from 137lbs on admission.  Creatinine improved from 1.64-1.42 with diuretics.  Plan is for repeat lab work at follow-up.  Unfortunately, the patient has a history of prior lung CA and esophageal CA found to have a right pleural effusion and mass on CXR.  He underwent a thoracentesis on 10/15/2019 with cytology pending.  Needs to follow with Dr. Lamonte Sakai as an outpatient.  Today, Glen Green presents for hospital follow-up with his wife. He reports that his breathing is much improved since hospitalization.  He has had no anginal symptoms.  Weight has continued to decrease. Office weight today is 122lb. We discussed concern for possible over-diuresis. Received call from lab  regarding cytology with no concern for cancer.  Has follow-up with Dr. Lamonte Sakai next month.  Has rescheduled his surgery with Dr. Trula Slade for next month as well.  Reviewed mass found on CXR in which they will need to speak with Dr. Lamonte Sakai regarding further assessment.  Wife has a lot of questions regarding heart healthy, low-sodium diet options. We will provide print out as they do not have Internet access.  BP on the low side today, 108/62.  Patient states that he has had no orthostatic symptoms or dizziness.  He is asking about resuming activities such as playing golf, doing yard work and driving.  I think this would be appropriate for him to resume as long as he pays close attention to his symptoms.  Overall doing fairly well however will make several medication adjustments with close follow-up described below.  Past Medical History:  Diagnosis Date  . AAA (abdominal aortic aneurysm) ([place]) 08/22/2012   3.9 cm by CT  - June 2013, stable   . 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 Atrial fibrillation ([place])   . CAD (coronary artery disease)   . 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 Carotid artery occlusion   . Cerebral aneurysm    [place]. repair  1994  . Chronic combined systolic (congestive) and diastolic (congestive) heart failure ([place])   . Dyslipidemia   . History of esophageal cancer    s/p transhiatal esophagogastrectomy  .  History of lung cancer   . Hypothyroidism   . Pneumonia   . Postoperative atrial fibrillation ([place]) 12/04/2013   Short course of amiodarone, resolved. Postop bypass  . SBO (small bowel obstruction) ([place]) 08/15/2012   History of small bowel obstruction (status post small bowel       resection, lysis of adhesions, incidental appendectomy, and repair       of left diaphragmatic hernia  . Stroke [facility]) 1995   denies residual    Past Surgical History:  Procedure Laterality Date  . ABDOMINAL AORTIC ENDOVASCULAR FENESTRATED STENT GRAFT N/A  02/25/2019   Procedure: ABDOMINAL AORTIC ENDOVASCULAR FENESTRATED STENT GRAFT;  Surgeon: Serafina Mitchell, MD;  Location: [facility];  Service: Vascular;  Laterality: N/A;  . BRAIN SURGERY     brain aneursyn  . CATARACT EXTRACTION W/ INTRAOCULAR LENS  IMPLANT, BILATERAL    . CHOLECYSTECTOMY    . CORONARY ARTERY BYPASS GRAFT N/A 07/30/2013   Procedure: CORONARY ARTERY BYPASS GRAFTING (CABG) times two on pump using left internal mammary artery and left greater saphenous vein via endovein harvest.;  Surgeon: Gaye Pollack, MD;  Location: [facility] OR;  Service: Open Heart Surgery;  Laterality: N/A;  . Exploratory laparotomy, lysis of adhesions, reduce of incarcerated small bowel and colon from the chest, limited small bowel resection, incidental appendectomy, and then repair of diaphragmatic hernia with AlloDerm mesh  10/27/2009   [place]  . Exploratory laparotomy,exploratory thoracotomy for hemorrhage  02/11/2003   [place]  . LEFT HEART CATHETERIZATION WITH CORONARY ANGIOGRAM N/A 07/30/2013   Procedure: LEFT HEART CATHETERIZATION WITH CORONARY ANGIOGRAM;  Surgeon: Peter M Martinique, MD;  Location: [facility] CATH LAB;  Service: Cardiovascular;  Laterality: N/A;  . Left subclavian Port- A-Cath insertion  03/09/2004   [place]  . left upper lobectomy with node dissection  02/24/2010   [place]  . TONSILLECTOMY    . Transhiatal esophagectomy with cholecystectomy, jejunostomy,  pyloroplasty and removal of right subclavian Port-A- Cath     [place]     Current Outpatient Medications  Medication Sig Dispense Refill  . acetaminophen (TYLENOL) 500 MG tablet Take 1,000 mg by mouth every 6 (six) hours as needed for mild pain or headache.    Marland Kitchen aspirin 81 MG tablet Take 1 tablet (81 mg total) by mouth daily. 30 tablet 0  . atorvastatin (LIPITOR) 40 MG tablet Take 40 mg by mouth daily.    . carvedilol (COREG) 12.5 MG tablet Take 1 tablet (12.5 mg total) by mouth 2 (two) times daily with a meal. 30 tablet 1  . clopidogrel (PLAVIX) 75  MG tablet Take 75 mg by mouth daily.    . furosemide (LASIX) 40 MG tablet Take 1 tablet (40 mg total) by mouth daily. 90 tablet 3  . guaiFENesin (MUCINEX) 600 MG 12 hr tablet Take 600 mg by mouth 2 (two) times daily as needed for to loosen phlegm.     . Tiotropium Bromide-Olodaterol (STIOLTO RESPIMAT) 2.5-2.5 MCG/ACT AERS Inhale 2 puffs into the lungs daily.     No current facility-administered medications for this visit.     Allergies:   Quinolones    Social History:  The patient  reports that he quit smoking about 16 years ago. His smoking use included cigarettes. He has never used smokeless tobacco. He reports that he does not drink alcohol or use drugs.   Family History:  The patient's family history includes Aneurysm in his maternal grandmother and mother; Diabetes in his sister; Heart disease in his maternal uncle  and paternal uncle; Kidney disease in his father.    ROS:  Please see the history of present illness.   Otherwise, review of systems are positive for none.   All other systems are reviewed and negative.    PHYSICAL EXAM: VS:  BP 108/62   Ht 5\' 10"  (1.778 m)   Wt 122 lb 1.9 oz (55.4 kg)   BMI 17.52 kg/m  , BMI Body mass index is 17.52 kg/m.   General: Frail,  NAD Lungs:Clear to ausculation, absent breath sounds left lower lobe secondary to lobectomy. No wheezes, rales, or rhonchi. Breathing is unlabored. Cardiovascular: RRR with S1 S2. No murmurs Extremities: No edema. No clubbing or cyanosis. PT pulses 1+ bilaterally Neuro: Alert and oriented. No focal deficits. No facial asymmetry. MAE spontaneously. Psych: Responds to questions appropriately with normal affect.     EKG:  EKG is not ordered today.   Recent Labs: 02/18/2019: ALT 22 02/25/2019: Magnesium 1.8 10/13/2019: B Natriuretic Peptide 1,143.3 10/14/2019: Hemoglobin 13.7; Platelets 195 10/16/2019: BUN 38; Creatinine, Ser 1.42; Potassium 4.0; Sodium 135    Lipid Panel    Component Value Date/Time   CHOL  129 10/14/2019 0518   TRIG 90 10/14/2019 0518   HDL 42 10/14/2019 0518   CHOLHDL 3.1 10/14/2019 0518   VLDL 18 10/14/2019 0518   LDLCALC 69 10/14/2019 0518     Wt Readings from Last 3 Encounters:  10/22/19 122 lb 1.9 oz (55.4 kg)  10/16/19 125 lb 1.6 oz (56.7 kg)  10/05/19 137 lb 8 oz (62.4 kg)     Other studies Reviewed: Additional studies/ records that were reviewed today include:  Echo 10/13/19 IMPRESSIONS  1. Left ventricular ejection fraction, by visual estimation, is 40 to 45%. The left ventricle has mild to moderately decreased function. There is no left ventricular hypertrophy. 2. Left ventricular diastolic function could not be evaluated. 3. Global right ventricle has normal systolic function.The right ventricular size is normal. 4. Left atrial size was mildly dilated. 5. Right atrial size was normal. 6. Mild mitral annular calcification. 7. The mitral valve is normal in structure. Severe mitral valve regurgitation. No evidence of mitral stenosis. 8. The tricuspid valve is normal in structure. Tricuspid valve regurgitation is severe. 9. The aortic valve is tricuspid. Aortic valve regurgitation is not visualized. Mild to moderate aortic valve sclerosis/calcification without any evidence of aortic stenosis. 10. The pulmonic valve was normal in structure. Pulmonic valve regurgitation is mild. 11. Moderately elevated pulmonary artery systolic pressure. 12. The inferior vena cava is dilated in size with >50% respiratory variability, suggesting right atrial pressure of 8 mmHg. 13. Mild to moderate global reduction in LV systolic function; sclerotic aortic valve; severe, posteriorly directed MR; mild LAE; severe TR; moderate pulmonary hypertension.   Exercise tolerance test 05/20/2018:   The patient walked for 2 minutes and 51 seconds of a standard Bruce protocol treadmill test. He achieved a peak heart rate of 103 which is only 71% predicted maximal heart rate. He  completed 4.6 METS.  He had left bundle branch block at baseline. There were no ST or T wave changes to suggest ischemia.  Fair exercise capacity.  The test is not diagnostic for ischemia because of his inability to achieve target heart rate.  ASSESSMENT AND PLAN:  1.  Recent hospitalization for acute on chronic systolic and diastolic heart failure: -Echocardiogram performed found mild decrease from previous study with LVEF now at 40 to 45%. -Diuresed 12 pounds with IV diuretics that is now been transitioned  to p.o. Lasix 40 mg daily>> will decrease this to Lasix 20 mg p.o. daily given further decrease in weight with concern for overdiuresis.  No fluid volume overload on exam today.  Lungs are clear. -Repeat lab work to assess kidney function -No room with BP to allow a ACE-I resumption  2.  CKD stage III: -Discharge creatinine, 1.42 down from 1.64 on presentation -Repeat lab work today  3.  CAD s/p CABG 2014: -No anginal symptoms -Continue ASA, Plavix, Lipitor, lisinopril and Coreg.  ACEI held secondary to renal dysfunction -Decrease carvedilol to 6.25 mg twice daily secondary to low BP  4.  Severe MR/TR: -Noted on echocardiogram during hospitalization -We will need repeat echocardiogram in 1 to 2 months for reevaluation -May need possible referral if worse on follow-up echo -Shortness of breath resolved  5.  PAD: -Scheduled for LE angiography for claudication however missed this due to hospitalization -Rescheduled with Dr. Trula Slade in 11/2019 -Continue ASA, Plavix and Lipitor  6.  AAA s/p FEVAR: -Chest CT measured at 4.3 cm on hospital admission -We will need follow-up, repeat CT in 1 year  7.  History of prior lung CA, esophageal CA: -Noted to have right pleural effusion and mass on CXR who underwent thoracentesis on 10/15/2019 with cytology negative for mets -Needs to follow with Dr. Lamonte Sakai as an outpatient>> 11/2019  8.  HLD: -LDL, 69 on 10/14/2019 -Continue statin   9.  COPD: -Discharged on home medications, to follow with Dr. Lamonte Sakai as above -No shortness of breath  10.  CT with increased growth of left kidney nodule: -Reiterated following with PCP on this -May eventually need nephrology referral -Checking lab work today   Current medicines are reviewed at length with the patient today.  The patient does not have concerns regarding medicines.  The following changes have been made: Decrease carvedilol to 6.25 mg twice daily, decrease Lasix to 20 mg p.o. daily  Labs/ tests ordered today include: BMET  No orders of the defined types were placed in this encounter.   Disposition:   FU with APP or Dr. Marlou Porch in 1 month  Signed, Kathyrn Drown, NP  10/22/2019 1:29 PM    [address], [place], [place]  [address] Phone: [phone]; Fax: [phone]

## 2019-10-16 NOTE — Progress Notes (Signed)
Progress Note  Patient Name: Glen Green Date of Encounter: 10/16/2019  Primary Cardiologist: Candee Furbish, MD   Subjective   Feeling well. Breathing stable.  No shortness of breath or dizziness.    Inpatient Medications    Scheduled Meds: . aspirin EC  81 mg Oral Daily  . atorvastatin  40 mg Oral q1800  . carvedilol  12.5 mg Oral BID WC  . enoxaparin (LOVENOX) injection  40 mg Subcutaneous Q24H  . furosemide  40 mg Oral Daily  . sodium chloride flush  3 mL Intravenous Q12H  . umeclidinium-vilanterol  1 puff Inhalation Daily   Continuous Infusions: . sodium chloride     PRN Meds: sodium chloride, acetaminophen, guaiFENesin, Ipratropium-Albuterol, ondansetron (ZOFRAN) IV, sodium chloride flush   Vital Signs    Vitals:   10/16/19 0712 10/16/19 0900 10/16/19 1035 10/16/19 1112  BP: 97/78   102/71  Pulse:      Resp:      Temp:      TempSrc:      SpO2:  94% 97%   Weight:      Height:        Intake/Output Summary (Last 24 hours) at 10/16/2019 1129 Last data filed at 10/16/2019 0600 Gross per 24 hour  Intake 700 ml  Output 400 ml  Net 300 ml   Last 3 Weights 10/16/2019 10/15/2019 10/14/2019  Weight (lbs) 125 lb 1.6 oz 128 lb 9.6 oz 128 lb 12.8 oz  Weight (kg) 56.745 kg 58.333 kg 58.423 kg      Telemetry    Sinus rhythm.  PVCs - Personally Reviewed  ECG    N/a - Personally Reviewed  Physical Exam   VS:  BP 102/71   Pulse 86   Temp (!) 97.5 F (36.4 C) (Oral)   Resp 20   Ht 5\' 10"  (1.778 m)   Wt 56.7 kg   SpO2 97%   BMI 17.95 kg/m  , BMI Body mass index is 17.95 kg/m. GENERAL:  Well appearing HEENT: Pupils equal round and reactive, fundi not visualized, oral mucosa unremarkable NECK:  No jugular venous distention, waveform within normal limits, carotid upstroke brisk and symmetric, no bruits LUNGS:  Clear to auscultation bilaterally HEART:  RRR.  PMI not displaced or sustained,S1 and S2 within normal limits, no S3, no S4, no clicks, no rubs, no  murmurs ABD:  Flat, positive bowel sounds normal in frequency in pitch, no bruits, no rebound, no guarding, no midline pulsatile mass, no hepatomegaly, no splenomegaly EXT:  Warm.  No edema, no cyanosis no clubbing SKIN:  No rashes no nodules NEURO:  Cranial nerves II through XII grossly intact, motor grossly intact throughout PSYCH:  Cognitively intact, oriented to person place and time   Labs    High Sensitivity Troponin:   Recent Labs  Lab 10/13/19 0602 10/13/19 0817  TROPONINIHS 19* 21*      Chemistry Recent Labs  Lab 10/14/19 0518 10/15/19 0313 10/16/19 0306  NA 139 138 135  K 4.6 4.1 4.0  CL 103 102 100  CO2 23 26 26   GLUCOSE 79 90 101*  BUN 39* 44* 38*  CREATININE 1.64* 1.59* 1.42*  CALCIUM 9.1 8.9 9.3  GFRNONAA 40* 41* 47*  GFRAA 46* 48* 55*  ANIONGAP 13 10 9      Hematology Recent Labs  Lab 10/13/19 0614 10/14/19 0518  WBC 5.5 6.2  RBC 3.92* 4.36  HGB 12.4* 13.7  HCT 40.6 42.1  MCV 103.6* 96.6  MCH 31.6  31.4  MCHC 30.5 32.5  RDW 18.2* 17.2*  PLT 183 195    BNP Recent Labs  Lab 10/13/19 0615  BNP 1,143.3*     DDimer No results for input(s): DDIMER in the last 168 hours.   Radiology    Dg Chest 1 View  Result Date: 10/15/2019 CLINICAL DATA:  Status post RIGHT thoracentesis. EXAM: CHEST  1 VIEW COMPARISON:  10/13/2019 FINDINGS: There is been slight decrease in RIGHT pleural effusion. No pneumothorax noted. Fluid within the RIGHT minor fissure is again noted. A small LEFT pleural effusion is again identified. Cardiomegaly and CABG changes noted. IMPRESSION: Slight decrease in RIGHT pleural effusion.  No pneumothorax. Electronically Signed   By: Margarette Canada M.D.   On: 10/15/2019 18:06   Ct Chest Wo Contrast  Result Date: 10/14/2019 CLINICAL DATA:  Acute respiratory illness. EXAM: CT CHEST WITHOUT CONTRAST TECHNIQUE: Multidetector CT imaging of the chest was performed following the standard protocol without IV contrast. COMPARISON:  December 23, 2018 FINDINGS: Cardiovascular: The heart size is enlarged. Ascending aorta is aneurysmal measuring approximately 4.3 cm in diameter. The main pulmonary artery is borderline dilated measuring approximately 3.1 cm in diameter. Advanced coronary artery calcifications are noted. Atherosclerotic changes are noted of the thoracic aorta. Mediastinum/Nodes: There are mildly enlarged mediastinal lymph nodes. These are relatively stable from prior study. The patient is status post prior soft ejected me. Lungs/Pleura: Moderate severe emphysematous changes are again noted. There is persistent architectural distortion of the left upper lung field. There is a multiloculated right-sided pleural effusion. The pleural fluid courses along the major and minor fissures. The effusion has increased in size since the prior study. Multiple pleural base calcified nodules are noted at the right lung base. There are few calcified pleural based plaques at the right lung base. Upper Abdomen: There are extensive postsurgical changes in the upper abdomen there are stable from prior study. No acute abnormality detected in the upper abdomen. Again noted is an exophytic nodule rising from the posterior interpolar region of the left kidney. This nodule has increased in size slightly from prior study in 2019. Right-sided nephrolithiasis is noted. Musculoskeletal: There is a chronic appearing Hill-Sachs deformity involving the proximal left humerus. IMPRESSION: 1. Evaluation is limited by lack of IV contrast. 2. Interval increase in size of a multiloculated right-sided pleural effusion. The effusion courses along the major and minor fissures. Given the patient's history of malignancy, sampling of this pleural fluid may be prudent for further evaluation. 3. Moderate to severe bilateral emphysematous changes. 4. Aneurysmal dilatation of the ascending aorta currently measuring approximately 4.3 cm. Recommend annual imaging followup by CTA or MRA. This  recommendation follows 2010 ACCF/AHA/AATS/ACR/ASA/SCA/SCAI/SIR/STS/SVM Guidelines for the Diagnosis and Management of Patients with Thoracic Aortic Disease. Circulation. 2010; 121: U981-X914. Aortic aneurysm NOS (ICD10-I71.9) 5. Mild mediastinal and hilar adenopathy, similar to prior study. This is presumably reactive. 6. Postsurgical changes related to prior esophagectomy. 7. Slight interval growth of an exophytic nodule rising from the posterior interpolar region of the left kidney. This is consistent with renal cell carcinoma until proven otherwise. 8. Right-sided nephrolithiasis. Aortic Atherosclerosis (ICD10-I70.0) and Emphysema (ICD10-J43.9). Electronically Signed   By: Constance Holster M.D.   On: 10/14/2019 23:08    Cardiac Studies   Echo 10/13/19:  IMPRESSIONS   1. Left ventricular ejection fraction, by visual estimation, is 40 to 45%. The left ventricle has mild to moderately decreased function. There is no left ventricular hypertrophy.  2. Left ventricular diastolic function could not  be evaluated.  3. Global right ventricle has normal systolic function.The right ventricular size is normal.  4. Left atrial size was mildly dilated.  5. Right atrial size was normal.  6. Mild mitral annular calcification.  7. The mitral valve is normal in structure. Severe mitral valve regurgitation. No evidence of mitral stenosis.  8. The tricuspid valve is normal in structure. Tricuspid valve regurgitation is severe.  9. The aortic valve is tricuspid. Aortic valve regurgitation is not visualized. Mild to moderate aortic valve sclerosis/calcification without any evidence of aortic stenosis. 10. The pulmonic valve was normal in structure. Pulmonic valve regurgitation is mild. 11. Moderately elevated pulmonary artery systolic pressure. 12. The inferior vena cava is dilated in size with >50% respiratory variability, suggesting right atrial pressure of 8 mmHg. 13. Mild to moderate global reduction in LV  systolic function; sclerotic aortic valve; severe, posteriorly directed MR; mild LAE; severe TR; moderate pulmonary hypertension.  Patient Profile     Mr. 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   Assessment & Plan    # Acute on chronic systolic and diastolic heart failure: BNP was 1143 in the ED.  He is feeling better after diuresis.  We discussed the importance of reducing his heavy salt intake.  Limit salt to 2g, fluid to 2L.  Increase furosemide from 20mg  to 40mg  daily.   # Severe MR/TR:  Noted on echo this admission.  Will need repeat echo in 1-2 months.    # CAD s/p CABG: Not an active issue.  Continue aspirin, carvedilol, and atorvastatin.   # Ascending aorta aneurysm:  # Prior AAA FEVAR: 4.3 cm on CT this admission.  Repeat in 1 year.  # L kidney nodule:  Concerning for RCC.  Will need PCP/nephrology follow up.   # Prior lung/esophageal cancer: Underwent thoracentesis 11/5.  Cytology will need to be followed by PCP/oncology as an outpatient.  # PAF: Post-operative.  Not an active issue.  # PAD:  # Hyperlipidemia:  Missed LE angio for claudication.  Will be rescheduled with Dr. Trula Slade in 3 weeks.  Continue aspirin and atorvastatin.   # COPD: Continue home nebs at discharge.     For questions or updates, please contact [facility] Please consult [email or web address] for contact info under        Signed, Skeet Latch, MD  10/16/2019, 11:29 AM

## 2019-10-16 NOTE — Progress Notes (Signed)
NAME:  Glen Green, MRN:  [id number], DOB:  11-16-1941, LOS: 3 ADMISSION DATE:  10/13/2019, CONSULTATION DATE:  10/13/19 REFERRING MD:  Lorin Mercy - TR[facility], CHIEF COMPLAINT:  SOB  Brief History   78 yo M presenting with SOB noted to have loculated appearing R sided effusion and small L sided effusion.   History of present illness   78 yo M PMH  lung and esophageal Cancer s/p esophagectomy and L upper lobectomy,  AAA s/p FEVAR, CVA, Afib, CAD, emphysema (followed by Dr. Lamonte Sakai PCCM) who presents 11/3 for SOB. Patient initially scheduled for angiogram 11/3 LLE claudication with identified popliteal lesion. Patient with worsening SOB this AM and instead presents to ED. Symptoms began 3 days ago and have progressed. Denies chest pain, endorses DOE, significant orthopnea. LLE cramping pain, no significant change from baseline. No new BLE edema or flank edema. Patient endorses non-compliance with plavix at home due to bleeding. Last plavix dose 11/1 vs 11/2 per patient's wife.   SARS COV2 negative   BNP 1143 in ED hsTroponin 21 Cr 1.53, K 4.9,  WBC 5.5 Lactic acid 2.0  H/H 12.4/40.6, plt 183   Past Medical History  Lung cancer esophageal cancer AAA s/p FEVAR CAD HLD HTN A fib  CVA  Significant Hospital Events   11/3 admitted to hospitalist. PCCM consulted for possible thoracentesis in setting of loculated R sided effusion  Consults:  PCCM  Procedures:  10/15/2019 right thoracentesis approximately 40 cc of fluid  Significant Diagnostic Tests:  11/3 CXR > enlarged cardiac silhouette. S/p sternotomy. Emphysematous changes. Bilateral ASD -- infiltrate vs atelectasis. RML density, likely loculated effusion. Small L sided effusion   CT chest 11/4 >> multi focal loculated right pleural effusion that involves both the major and  fissures, moderate to severe bilateral emphysematous changes, ascending aortic aneurysm 4.3 cm, stable mediastinal hilar lymphadenopathy, exophytic left renal nodule  10/15/2019 for fluid obtained from right thoracentesis>> Micro Data:  11/3 SARS CoV2> neg  11/3 RVP >>> negative  Antimicrobials:   Interim history/subjective:  Patient has been weaned to room air Denies any dyspnea, at least at rest  Objective   Blood pressure 97/78, pulse 86, temperature (!) 97.5 F (36.4 C), temperature source Oral, resp. rate 20, height 5\' 10"  (1.778 m), weight 56.7 kg, SpO2 94 %.        Intake/Output Summary (Last 24 hours) at 10/16/2019 0944 Last data filed at 10/16/2019 0600 Gross per 24 hour  Intake 1060 ml  Output 400 ml  Net 660 ml   Filed Weights   10/14/19 0434 10/15/19 0427 10/16/19 0300  Weight: 58.4 kg 58.3 kg 56.7 kg    Examination: General: Frail elderly male no acute distress at rest HEENT: MM pink/moist Neuro: Grossly intact CV: s1s2 heart sounds are regular PULM: diminished in the bases GI: soft, bsx4 active  Extremities: warm/dry, negative edema  Skin: no rashes or lesions   Resolved Hospital Problem list     Assessment & Plan:   Loculated, slightly enlarged right pleural effusion with involvement in the major and  fissures consistent with a pseudotumor.  Suspect that this corresponds with his overall volume overload at presentation.  Must also consider malignancy given his history of lung cancer, esophageal cancer.  P Status post thoracentesis 40 cc obtained on 12/14/2018 Await studies  COPD on Stiolto P Substitute Anoro Combivent as needed  Peripheral vascular disease P Per vascular surgery   Labs   CBC: Recent Labs  Lab 10/13/19 0614 10/14/19 0518  WBC 5.5 6.2  NEUTROABS 3.5 4.3  HGB 12.4* 13.7  HCT 40.6 42.1  MCV 103.6* 96.6  PLT 183 203    Basic Metabolic Panel: Recent Labs  Lab 10/13/19 0614 10/14/19 0518 10/15/19 0313 10/16/19 0306  NA 142 139 138 135  K 4.9 4.6 4.1 4.0  CL 111 103 102 100  CO2 21* 23 26 26   GLUCOSE 98 79 90 101*  BUN 33* 39* 44* 38*  CREATININE 1.53* 1.64* 1.59*  1.42*  CALCIUM 9.2 9.1 8.9 9.3   GFR: Estimated Creatinine Clearance: 34.9 mL/min (A) (by C-G formula based on SCr of 1.42 mg/dL (H)). Recent Labs  Lab 10/13/19 0614 10/13/19 0615 10/13/19 0817 10/14/19 0518  WBC 5.5  --   --  6.2  LATICACIDVEN  --  2.0* 1.1  --     Liver Function Tests: No results for input(s): AST, ALT, ALKPHOS, BILITOT, PROT, ALBUMIN in the last 168 hours. No results for input(s): LIPASE, AMYLASE in the last 168 hours. No results for input(s): AMMONIA in the last 168 hours.  ABG    Component Value Date/Time   PHART 7.307 (L) 02/25/2019 1524   PCO2ART 41.1 02/25/2019 1524   PO2ART 305.0 (H) 02/25/2019 1524   HCO3 20.6 02/25/2019 1524   TCO2 22 02/25/2019 1524   ACIDBASEDEF 5.0 (H) 02/25/2019 1524   O2SAT 100.0 02/25/2019 1524     Coagulation Profile: No results for input(s): INR, PROTIME in the last 168 hours.  Cardiac Enzymes: No results for input(s): CKTOTAL, CKMB, CKMBINDEX, TROPONINI in the last 168 hours.  HbA1C: Hgb A1c MFr Bld  Date/Time Value Ref Range Status  07/30/2013 05:40 PM 6.5 (H) <5.7 % Final    Comment:    (NOTE)                                                                       According to the ADA Clinical Practice Recommendations for 2011, when HbA1c is used as a screening test:  >=6.5%   Diagnostic of Diabetes Mellitus           (if abnormal result is confirmed) 5.7-6.4%   Increased risk of developing Diabetes Mellitus References:Diagnosis and Classification of Diabetes Mellitus,Diabetes TDHR,4163,84(TXMIW 1):S62-S69 and Standards of Medical Care in         Diabetes - 2011,Diabetes OEHO,1224,82 (Suppl 1):S11-S61.  06/08/2009 06:05 PM (H) 4.6 - 6.1 % Final   6.2 (NOTE) The ADA recommends the following therapeutic goal for glycemic control related to Hgb A1c measurement: Goal of therapy: <6.5 Hgb A1c  Reference: American Diabetes Association: Clinical Practice Recommendations 2010, Diabetes Care, 2010, 33: (Suppl  1).     CBG: No results for input(s): GLUCAP in the last 168 hours.    Richardson Landry  ACNP Maryanna Shape PCCM Pager [phone] till 1 pm If no answer page [phone] 10/16/2019, 9:44 AM

## 2019-10-18 LAB — CULTURE, BLOOD (ROUTINE X 2)
Culture: NO GROWTH
Culture: NO GROWTH

## 2019-10-19 ENCOUNTER — Other Ambulatory Visit: Payer: Self-pay | Admitting: *Deleted

## 2019-10-19 ENCOUNTER — Inpatient Hospital Stay: Payer: Medicare Other | Admitting: Pulmonary Disease

## 2019-10-19 LAB — BODY FLUID CULTURE: Culture: NO GROWTH

## 2019-10-19 LAB — CYTOLOGY - NON PAP

## 2019-10-19 NOTE — Progress Notes (Deleted)
@Patient  ID: [place], male    DOB: 1941/07/10, 78 y.o.   MRN: [id number]  No chief complaint on file.   Referring provider: Alroy Dust, L.Marlou Sa, MD  HPI:  78 year old male former smoker followed in our office for COPD/emphysema.  Patient has a history of left upper lobectomy for squamous cell lung cancer in 2011.  PMH: CAD, hypertension, A. Fib, history of AAA stent graft in March/2020 with Dr. Trula Slade Smoker/ Smoking History: Former smoker Maintenance: Stiolto Respimat Pt of: Dr. Lamonte Sakai  10/19/2019  - Visit     Chart Review:   10/13/2019-hospitalization-acute on chronic combined systolic and diastolic congestive heart failure Discharge date: 10/16/2019 10/15/2019-PCCM consult note for right thoracentesis 10/15/2019 right thoracentesis approximately 40 cc of fluid  Questionaires / Pulmonary Flowsheets:   MMRC: No flowsheet data found.  Tests:   10/14/2019-CT chest without contrast-evaluation is limited by lack of IV contrast, interval increase in size of multiloculated right-sided pleural effusion, effusion courses along the major and minor fissures given the patient's history of malignancy sampling of this pleural fluid may be prudent for further evaluation, moderate to severe bilateral emphysematous changes, aneurysmal dilatation of ascending aorta currently measuring 4.3 cm, mild mediastinal and hilar adenopathy  10/15/2019-chest x-ray-status post right thoracentesis, slight decrease in right pleural effusion 10/13/2019-echocardiogram-LV ejection fraction 40 to 45%, left ventricular diastolic function could not be evaluated, left atrial size was mildly dilated, right atrial size was normal moderately elevated pulmonary arterial systolic pressure  9/38/1017-PZWCHENID function tests-FVC 2.76 (65% predicted), postbronchodilator ratio 89, postbronchodilator FEV1 2.44 (80% predicted), positive bronchodilator response in FEV1, mid flow reversibility, DLCO 20.46 (63% predicted)    10/15/2019-right thoracentesis-40 cc removed Pleural fluid, no growth over 3 days Total protein-negative less than 3 LDH 92 pH 7.6 Body fluid cell count-neutrophil count 6, lymphs 76%, monocytes 18%, eosinophils-0 Amylase-64 AFP tumor marker-1.9 Cytology - pending   FENO:  Lab Results  Component Value Date   NITRICOXIDE 25 04/02/2018    PFT: PFT Results Latest Ref Rng & Units 04/04/2018  FVC-Pre L 2.76  FVC-Predicted Pre % 65  FVC-Post L 2.74  FVC-Predicted Post % 65  Pre FEV1/FVC % % 75  Post FEV1/FCV % % 89  FEV1-Pre L 2.06  FEV1-Predicted Pre % 67  FEV1-Post L 2.44  DLCO UNC% % 63  DLCO COR %Predicted % 58  TLC L 4.69  TLC % Predicted % 66  RV % Predicted % 64    WALK:  SIX MIN WALK 04/10/2018  Medications carvedilol 12.5mg  and furosemide 20mg  were taken this AM around 9am  Supplimental Oxygen during Test? (L/min) No  Laps 8  Partial Lap (in Meters) 0  Baseline BP (sitting) 110/70  Baseline Heartrate 65  Baseline Dyspnea (Borg Scale) 1  Baseline Fatigue (Borg Scale) 1  Baseline SPO2 99  BP (sitting) 120/82  Heartrate 78  Dyspnea (Borg Scale) 1  Fatigue (Borg Scale) 1  SPO2 95  BP (sitting) 116/80  Heartrate 65  SPO2 99  Stopped or Paused before Six Minutes No  Interpretation (No Data)  Distance Completed 384  Tech Comments: Patient was able to complete the entire 55mw without stopping. Denied any chest pain of SOB. Did state that his legs and calves were sore but for him, this is normal. He was able to walk at a brisk pace. O2 was not needed during walk.     Imaging: Dg Chest 1 View  Result Date: 10/15/2019 CLINICAL DATA:  Status post RIGHT thoracentesis. EXAM: CHEST  1 VIEW  COMPARISON:  10/13/2019 FINDINGS: There is been slight decrease in RIGHT pleural effusion. No pneumothorax noted. Fluid within the RIGHT minor fissure is again noted. A small LEFT pleural effusion is again identified. Cardiomegaly and CABG changes noted. IMPRESSION: Slight decrease in  RIGHT pleural effusion.  No pneumothorax. Electronically Signed   By: Margarette Canada M.D.   On: 10/15/2019 18:06   Ct Chest Wo Contrast  Result Date: 10/14/2019 CLINICAL DATA:  Acute respiratory illness. EXAM: CT CHEST WITHOUT CONTRAST TECHNIQUE: Multidetector CT imaging of the chest was performed following the standard protocol without IV contrast. COMPARISON:  December 23, 2018 FINDINGS: Cardiovascular: The heart size is enlarged. Ascending aorta is aneurysmal measuring approximately 4.3 cm in diameter. The main pulmonary artery is borderline dilated measuring approximately 3.1 cm in diameter. Advanced coronary artery calcifications are noted. Atherosclerotic changes are noted of the thoracic aorta. Mediastinum/Nodes: There are mildly enlarged mediastinal lymph nodes. These are relatively stable from prior study. The patient is status post prior soft ejected me. Lungs/Pleura: Moderate severe emphysematous changes are again noted. There is persistent architectural distortion of the left upper lung field. There is a multiloculated right-sided pleural effusion. The pleural fluid courses along the major and minor fissures. The effusion has increased in size since the prior study. Multiple pleural base calcified nodules are noted at the right lung base. There are few calcified pleural based plaques at the right lung base. Upper Abdomen: There are extensive postsurgical changes in the upper abdomen there are stable from prior study. No acute abnormality detected in the upper abdomen. Again noted is an exophytic nodule rising from the posterior interpolar region of the left kidney. This nodule has increased in size slightly from prior study in 2019. Right-sided nephrolithiasis is noted. Musculoskeletal: There is a chronic appearing Hill-Sachs deformity involving the proximal left humerus. IMPRESSION: 1. Evaluation is limited by lack of IV contrast. 2. Interval increase in size of a multiloculated right-sided pleural  effusion. The effusion courses along the major and minor fissures. Given the patient's history of malignancy, sampling of this pleural fluid may be prudent for further evaluation. 3. Moderate to severe bilateral emphysematous changes. 4. Aneurysmal dilatation of the ascending aorta currently measuring approximately 4.3 cm. Recommend annual imaging followup by CTA or MRA. This recommendation follows 2010 ACCF/AHA/AATS/ACR/ASA/SCA/SCAI/SIR/STS/SVM Guidelines for the Diagnosis and Management of Patients with Thoracic Aortic Disease. Circulation. 2010; 121: J009-F818. Aortic aneurysm NOS (ICD10-I71.9) 5. Mild mediastinal and hilar adenopathy, similar to prior study. This is presumably reactive. 6. Postsurgical changes related to prior esophagectomy. 7. Slight interval growth of an exophytic nodule rising from the posterior interpolar region of the left kidney. This is consistent with renal cell carcinoma until proven otherwise. 8. Right-sided nephrolithiasis. Aortic Atherosclerosis (ICD10-I70.0) and Emphysema (ICD10-J43.9). Electronically Signed   By: Constance Holster M.D.   On: 10/14/2019 23:08   Dg Chest Portable 1 View  Result Date: 10/13/2019 CLINICAL DATA:  Shortness of breath EXAM: PORTABLE CHEST 1 VIEW COMPARISON:  February 25, 2019 FINDINGS: Heart size is enlarged. Aortic calcifications are noted. The patient is status post prior median sternotomy. There is a rounded density overlying the right mid lung zone favored to represent a loculated right-sided pleural effusion. Emphysematous changes are noted bilaterally. There are bibasilar airspace opacities which may represent atelectasis or infiltrate. IMPRESSION: 1. Loculated right-sided pleural effusion. There is a masslike appearance of this pleural fluid in the right mid lung zone. This is similar to prior CT from July 2020. 2. Small left-sided pleural effusion. 3.  Bibasilar airspace opacities favored to represent atelectasis, however an infiltrate is not  excluded. Electronically Signed   By: Constance Holster M.D.   On: 10/13/2019 06:23   Vas [place] Burnard Bunting With/wo Tbi  Result Date: 10/09/2019 LOWER EXTREMITY DOPPLER STUDY Indications: Claudication. High Risk Factors: Hypertension, hyperlipidemia, past history of smoking, prior                    MI, coronary artery disease. Other Factors: Sudden severe left calf claudication 2 days prior.  Vascular Interventions: EVAR 02/25/2019. Performing Technologist: Delorise Shiner RVT  Examination Guidelines: A complete evaluation includes at minimum, Doppler waveform signals and systolic blood pressure reading at the level of bilateral brachial, anterior tibial, and posterior tibial arteries, when vessel segments are accessible. Bilateral testing is considered an integral part of a complete examination. Photoelectric Plethysmograph (PPG) waveforms and toe systolic pressure readings are included as required and additional duplex testing as needed. Limited examinations for reoccurring indications may be performed as noted.  ABI Findings: +---------+------------------+-----+----------+--------+  Right     Rt Pressure (mmHg) Index Waveform   Comment   +---------+------------------+-----+----------+--------+  Brachial  108                                           +---------+------------------+-----+----------+--------+  ATA       72                 0.67                       +---------+------------------+-----+----------+--------+  PTA       71                 0.66  monophasic           +---------+------------------+-----+----------+--------+  DP                                 monophasic           +---------+------------------+-----+----------+--------+  Great Toe 0                  0.00                       +---------+------------------+-----+----------+--------+ +---------+------------------+-----+----------+-------+  Left      Lt Pressure (mmHg) Index Waveform   Comment  +---------+------------------+-----+----------+-------+   Brachial  103                                          +---------+------------------+-----+----------+-------+  ATA       52                 0.48                      +---------+------------------+-----+----------+-------+  PTA       69                 0.64  monophasic          +---------+------------------+-----+----------+-------+  DP  monophasic          +---------+------------------+-----+----------+-------+  Great Toe 6                  0.06                      +---------+------------------+-----+----------+-------+ +-------+-----------+-----------+------------+------------+  ABI/TBI Today's ABI Today's TBI Previous ABI Previous TBI  +-------+-----------+-----------+------------+------------+  Right   0.67        0.0         0.70         0.55          +-------+-----------+-----------+------------+------------+  Left    0.64        0.06        0.85         0.61          +-------+-----------+-----------+------------+------------+ Right ABIs appear essentially unchanged compared to prior study on 03/10/2018. Left ABIs appear decreased compared to prior study on 03/10/2018.  Summary: Right: Resting right ankle-brachial index indicates moderate right lower extremity arterial disease. The right toe-brachial index is abnormal. RT great toe pressure = 0 mmHg. Left: Resting left ankle-brachial index indicates moderate left lower extremity arterial disease. The left toe-brachial index is abnormal. LT Great toe pressure = 6 mmHg.  *See table(s) above for measurements and observations.  Electronically signed by Servando Snare MD on 10/09/2019 at 4:14:12 PM.    Final    Vas [place] Lower Extremity Arterial Duplex  Result Date: 09/30/2019 LOWER EXTREMITY ARTERIAL DUPLEX STUDY Indications: Claudication, and peripheral artery disease. High Risk Factors: Hypertension, hyperlipidemia, past history of smoking, prior                    MI, coronary artery disease. Other Factors: Sudden severe left  calf claudication 2 days prior.  Vascular Interventions: EVAR 02/25/2019. Current ABI:            Right: 0.67, Left: 0.64 Performing Technologist: Delorise Shiner RVT  Examination Guidelines: A complete evaluation includes B-mode imaging, spectral Doppler, color Doppler, and power Doppler as needed of all accessible portions of each vessel. Bilateral testing is considered an integral part of a complete examination. Limited examinations for reoccurring indications may be performed as noted.  +----------+--------+-----+--------+----------+---------+  RIGHT      PSV cm/s Ratio Stenosis Waveform   Comments   +----------+--------+-----+--------+----------+---------+  CFA Distal 99                      triphasic             +----------+--------+-----+--------+----------+---------+  DFA        85                      biphasic              +----------+--------+-----+--------+----------+---------+  SFA Prox   58                      biphasic              +----------+--------+-----+--------+----------+---------+  SFA Mid    43                      biphasic              +----------+--------+-----+--------+----------+---------+  SFA Distal 42  monophasic            +----------+--------+-----+--------+----------+---------+  POP Prox   200                     monophasic            +----------+--------+-----+--------+----------+---------+  POP Distal 14                      monophasic Retrgrade  +----------+--------+-----+--------+----------+---------+  ATA Distal 26                      monophasic            +----------+--------+-----+--------+----------+---------+  PTA Distal 15                      monophasic            +----------+--------+-----+--------+----------+---------+ A focal velocity elevation of 200 cm/s was obtained at DIstal SFA/ Prox pop with a VR of 4.8. Findings are characteristic of 75-99% stenosis.  +----------+--------+-----+--------+----------+--------+  LEFT       PSV  cm/s Ratio Stenosis Waveform   Comments  +----------+--------+-----+--------+----------+--------+  CFA Distal 70                      triphasic            +----------+--------+-----+--------+----------+--------+  DFA        82                      biphasic             +----------+--------+-----+--------+----------+--------+  SFA Prox   65                      triphasic            +----------+--------+-----+--------+----------+--------+  SFA Mid    51                      biphasic             +----------+--------+-----+--------+----------+--------+  SFA Distal 25                      monophasic           +----------+--------+-----+--------+----------+--------+  POP Prox   60                      biphasic             +----------+--------+-----+--------+----------+--------+  POP Distal 424                     monophasic           +----------+--------+-----+--------+----------+--------+  ATA Distal 14                      monophasic           +----------+--------+-----+--------+----------+--------+  PTA Distal 18                      monophasic           +----------+--------+-----+--------+----------+--------+ A focal velocity elevation of 424 cm/s was obtained at Distal popliteal artery with a VR of 7.1. Findings are characteristic of 75-99% stenosis.  Summary: Right: 75-99% stenosis noted in the popliteal artery. Left: 75-99% stenosis noted in the popliteal artery.  See  table(s) above for measurements and observations. Electronically signed by Deitra Mayo MD on 09/30/2019 at 4:44:41 PM.    Final    Vas [place] Lower Extremity Venous (dvt)  Result Date: 10/05/2019  Lower Venous Study Indications: Pain. Other Indications: Bilateral calf pain, left worse than right. Performing Technologist: Delorise Shiner RVT  Examination Guidelines: A complete evaluation includes B-mode imaging, spectral Doppler, color Doppler, and power Doppler as needed of all accessible portions of each vessel. Bilateral testing is considered  an integral part of a complete examination. Limited examinations for reoccurring indications may be performed as noted.  +---------+---------------+---------+-----------+----------+--------------+  RIGHT     Compressibility Phasicity Spontaneity Properties Thrombus Aging  +---------+---------------+---------+-----------+----------+--------------+  CFV       Full            Yes       Yes                                    +---------+---------------+---------+-----------+----------+--------------+  SFJ       Full                      Yes                                    +---------+---------------+---------+-----------+----------+--------------+  FV Prox   Full            Yes       Yes                                    +---------+---------------+---------+-----------+----------+--------------+  FV Mid    Full            Yes       Yes                                    +---------+---------------+---------+-----------+----------+--------------+  FV Distal Full            Yes       Yes                                    +---------+---------------+---------+-----------+----------+--------------+  PFV       Full                      Yes                                    +---------+---------------+---------+-----------+----------+--------------+  POP       Full            Yes       Yes                                    +---------+---------------+---------+-----------+----------+--------------+  PTV       Full                                                             +---------+---------------+---------+-----------+----------+--------------+  PERO      Full                                                             +---------+---------------+---------+-----------+----------+--------------+  GSV       Full                      Yes                                    +---------+---------------+---------+-----------+----------+--------------+  SSV       Full                      Yes                                     +---------+---------------+---------+-----------+----------+--------------+   +---------+---------------+---------+-----------+----------+--------------+  LEFT      Compressibility Phasicity Spontaneity Properties Thrombus Aging  +---------+---------------+---------+-----------+----------+--------------+  CFV       Full            Yes       Yes                                    +---------+---------------+---------+-----------+----------+--------------+  SFJ       Full                      Yes                                    +---------+---------------+---------+-----------+----------+--------------+  FV Prox   Full            Yes       Yes                                    +---------+---------------+---------+-----------+----------+--------------+  FV Mid    Full            Yes       Yes                                    +---------+---------------+---------+-----------+----------+--------------+  FV Distal Full            Yes       Yes                                    +---------+---------------+---------+-----------+----------+--------------+  PFV       Full            Yes       Yes                                    +---------+---------------+---------+-----------+----------+--------------+  POP  Full            Yes       Yes                                    +---------+---------------+---------+-----------+----------+--------------+  PTV       Full                                                             +---------+---------------+---------+-----------+----------+--------------+  PERO      Full                                                             +---------+---------------+---------+-----------+----------+--------------+  GSV       Full                      Yes                                    +---------+---------------+---------+-----------+----------+--------------+  SSV       Full                      Yes                                     +---------+---------------+---------+-----------+----------+--------------+     Summary: Right: There is no evidence of deep vein thrombosis in the lower extremity. There is no evidence of superficial venous thrombosis. No cystic structure found in the popliteal fossa. Left: There is no evidence of deep vein thrombosis in the lower extremity. There is no evidence of superficial venous thrombosis. No cystic structure found in the popliteal fossa.  *See table(s) above for measurements and observations. Electronically signed by Harold Barban MD on 10/05/2019 at 10:19:37 AM.    Final     Lab Results:  CBC    Component Value Date/Time   WBC 6.2 10/14/2019 0518   RBC 4.36 10/14/2019 0518   HGB 13.7 10/14/2019 0518   HGB 14.5 05/10/2008 0822   HCT 42.1 10/14/2019 0518   HCT 42.8 05/10/2008 0822   PLT 195 10/14/2019 0518   PLT 241 05/10/2008 0822   MCV 96.6 10/14/2019 0518   MCV 94.4 05/10/2008 0822   MCH 31.4 10/14/2019 0518   MCHC 32.5 10/14/2019 0518   RDW 17.2 (H) 10/14/2019 0518   RDW 14.1 05/10/2008 0822   LYMPHSABS 1.0 10/14/2019 0518   LYMPHSABS 1.5 05/10/2008 0822   MONOABS 0.7 10/14/2019 0518   MONOABS 0.6 05/10/2008 0822   EOSABS 0.1 10/14/2019 0518   EOSABS 0.2 05/10/2008 0822   BASOSABS 0.0 10/14/2019 0518   BASOSABS 0.0 05/10/2008 0822    BMET    Component Value Date/Time   NA 135 10/16/2019 0306   K 4.0 10/16/2019 0306   CL 100 10/16/2019 0306   CO2 26 10/16/2019 0306   GLUCOSE  101 (H) 10/16/2019 0306   BUN 38 (H) 10/16/2019 0306   CREATININE 1.42 (H) 10/16/2019 0306   CREATININE 0.99 06/22/2013 0912   CALCIUM 9.3 10/16/2019 0306   GFRNONAA 47 (L) 10/16/2019 0306   GFRAA 55 (L) 10/16/2019 0306    BNP    Component Value Date/Time   BNP 1,143.3 (H) 10/13/2019 0615    ProBNP    Component Value Date/Time   PROBNP 10,512.0 (H) 07/30/2013 1253    Specialty Problems      Pulmonary Problems   Acute respiratory failure with hypoxia (HCC)   Pulmonary  emphysema (HCC)   Pleural effusion on right    Chest x-ray 03/08/2017  Bilateral pleural effusions, including a loculated right-sided effusion.  Chronic interstitial thickening suspicious for mild pulmonary venous congestion, superimposed upon emphysema.          Allergies  Allergen Reactions   Quinolones Other (See Comments)    Patient was warned about not using Cipro and similar antibiotics. Recent studies have raised concern that fluoroquinolone antibiotics could be associated with an increased risk of aortic aneurysm Fluoroquinolones have non-antimicrobial properties that might jeopardise the integrity of the extracellular matrix of the vascular wall In a  propensity score matched cohort study in [place], there was a 66% increased rate of aortic aneurysm or dissection associated with oral fluoroquinolone use, compared wit    Immunization History  Administered Date(s) Administered   Influenza Split 08/22/2012   Influenza, High Dose Seasonal PF 09/03/2017, 09/10/2018   Pneumococcal Conjugate-13 08/10/2010    Past Medical History:  Diagnosis Date   AAA (abdominal aortic aneurysm) ([place]) 08/22/2012   3.9 cm by CT  - June 2013, stable    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 repair  1994   Chronic combined systolic (congestive) and diastolic (congestive) heart failure (HCC)    Dyslipidemia    History of esophageal cancer    s/p transhiatal esophagogastrectomy   History of lung cancer    Hypothyroidism    Pneumonia    Postoperative atrial fibrillation ([place]) 12/04/2013   Short course of amiodarone, resolved. Postop bypass   SBO (small bowel obstruction) ([place]) 08/15/2012   History of small bowel obstruction (status post small bowel       resection, lysis of  adhesions, incidental appendectomy, and repair       of left diaphragmatic hernia   Stroke ([place]) 1995   denies residual    Tobacco History: Social History   Tobacco Use  Smoking Status Former Smoker   Types: Cigarettes   Quit date: 12/10/2002   Years since quitting: 16.8  Smokeless Tobacco Never Used   Counseling given: Not Answered   Continue to not smoke  Outpatient Encounter Medications as of 10/19/2019  Medication Sig   acetaminophen (TYLENOL) 500 MG tablet Take 1,000 mg by mouth every 6 (six) hours as needed for mild pain or headache.   aspirin 81 MG tablet Take 1 tablet (81 mg total) by mouth daily.   atorvastatin (LIPITOR) 40 MG tablet TAKE 1 TABLET BY MOUTH DAILY AT 6 PM (Patient taking differently: Take 40 mg by mouth daily at 6 PM. )   carvedilol (COREG) 12.5 MG tablet TAKE 1 TABLET (12.5 MG TOTAL) BY MOUTH 2 (TWO) TIMES DAILY (  Patient taking differently: Take 12.5 mg by mouth. TAKE 1 TABLET (12.5 MG TOTAL) BY MOUTH 2 (TWO) TIMES DAILY)   carvedilol (COREG) 12.5 MG tablet Take 1 tablet (12.5 mg total) by mouth 2 (two) times daily with a meal.   clopidogrel (PLAVIX) 75 MG tablet TAKE 1 TABLET BY MOUTH EVERY DAY (Patient taking differently: Take 75 mg by mouth daily. )   furosemide (LASIX) 40 MG tablet Take 1 tablet (40 mg total) by mouth daily.   furosemide (LASIX) 40 MG tablet Take 1 tablet (40 mg total) by mouth daily.   guaiFENesin (MUCINEX) 600 MG 12 hr tablet Take 600 mg by mouth 2 (two) times daily as needed for to loosen phlegm.    STIOLTO RESPIMAT 2.5-2.5 MCG/ACT AERS INHALE 2 PUFFS BY MOUTH INTO THE LUNGS DAILY (Patient taking differently: Inhale 2 puffs into the lungs daily. )   No facility-administered encounter medications on file as of 10/19/2019.      Review of Systems  Review of Systems   Physical Exam  There were no vitals taken for this visit.  Wt Readings from Last 5 Encounters:  10/16/19 125 lb 1.6 oz (56.7 kg)  10/05/19 137 lb 8 oz  (62.4 kg)  09/30/19 135 lb (61.2 kg)  06/09/19 136 lb (61.7 kg)  04/20/19 143 lb 12.8 oz (65.2 kg)    BMI Readings from Last 5 Encounters:  10/16/19 17.95 kg/m  10/05/19 19.73 kg/m  09/30/19 19.37 kg/m  06/09/19 19.51 kg/m  04/20/19 20.63 kg/m     Physical Exam    Assessment & Plan:   No problem-specific Assessment & Plan notes found for this encounter.    No follow-ups on file.   Lauraine Rinne, NP 10/19/2019   This appointment was *** minutes long with over 50% of the time in direct face-to-face patient care, assessment, plan of care, and follow-up.

## 2019-10-19 NOTE — Progress Notes (Signed)
Call and spoke with patient's wife with instructions for 11/10/2019 procedure. Nasal Swab at [facility] 11/06/2019 at 9am. Instructed to be at [facility] admitting at [place]. NPO past MN night prior. Take Carvediolol with sips of water, any needed inhalers morning of procedure and hold all other medication until after this procedure. Must have a driver and caregiver for discharge to home. Verbalized understanding and will call this office if any further questions.

## 2019-10-20 ENCOUNTER — Telehealth: Payer: Self-pay | Admitting: Pulmonary Disease

## 2019-10-20 NOTE — Telephone Encounter (Signed)
Thanks for letting me know!

## 2019-10-20 NOTE — Telephone Encounter (Signed)
10/20/2019 1148  Patient no-show to office visit on 10/19/2019.  Patient was rescheduled with Dr. Lamonte Sakai in December/2020.   We will route to Dr. Lamonte Sakai as Juluis Rainier as he saw the patient when he was hospitalized.   Wyn Quaker, FNP

## 2019-10-21 ENCOUNTER — Telehealth: Payer: Self-pay | Admitting: Cardiology

## 2019-10-21 ENCOUNTER — Telehealth: Payer: Self-pay | Admitting: Emergency Medicine

## 2019-10-21 NOTE — Telephone Encounter (Signed)
Patient's wife would like to come with patient to his appt. He just got out of the hospital and she states he is very frail and does not like filling out any paperwork. She also has a question regarding some medication papers he received, she is unsure how to read the paper and needs clarification.

## 2019-10-21 NOTE — Telephone Encounter (Signed)
I called and spoke with patients wife Silva Bandy, she is aware that it is ok to come with patient and accompany him upstairs for office visit. Silva Bandy will bring medication paperwork that she needs help filling out.

## 2019-10-21 NOTE — Telephone Encounter (Signed)
Call returned to patient wife [place] (dpr), confirmed husbands dob, requesting results of "lung fluid". I made her aware I would have to send a message and we would get back with her.   RB or DS pt requesting path results. I was not sure if I should send to [facility] or [facility]. Thanks.

## 2019-10-21 NOTE — Telephone Encounter (Signed)
Called and updated wife regarding neg path.  Linna Hoff

## 2019-10-22 ENCOUNTER — Ambulatory Visit: Payer: Medicare Other | Admitting: Cardiology

## 2019-10-22 ENCOUNTER — Other Ambulatory Visit: Payer: Self-pay

## 2019-10-22 ENCOUNTER — Encounter: Payer: Self-pay | Admitting: Cardiology

## 2019-10-22 VITALS — BP 108/62 | HR 66 | Ht 70.0 in | Wt 122.1 lb

## 2019-10-22 DIAGNOSIS — I10 Essential (primary) hypertension: Secondary | ICD-10-CM | POA: Diagnosis not present

## 2019-10-22 DIAGNOSIS — I2510 Atherosclerotic heart disease of native coronary artery without angina pectoris: Secondary | ICD-10-CM

## 2019-10-22 DIAGNOSIS — I340 Nonrheumatic mitral (valve) insufficiency: Secondary | ICD-10-CM

## 2019-10-22 DIAGNOSIS — I5022 Chronic systolic (congestive) heart failure: Secondary | ICD-10-CM

## 2019-10-22 DIAGNOSIS — I255 Ischemic cardiomyopathy: Secondary | ICD-10-CM | POA: Diagnosis not present

## 2019-10-22 DIAGNOSIS — E7800 Pure hypercholesterolemia, unspecified: Secondary | ICD-10-CM

## 2019-10-22 MED ORDER — CARVEDILOL 6.25 MG PO TABS: 6.2500 mg | ORAL_TABLET | Freq: Two times a day (BID) | ORAL | 3 refills | Status: AC

## 2019-10-22 MED ORDER — FUROSEMIDE 20 MG PO TABS: 20.0000 mg | ORAL_TABLET | Freq: Every day | ORAL | 3 refills | Status: AC

## 2019-10-22 NOTE — Patient Instructions (Addendum)
Medication Instructions:   Your physician has recommended you make the following change in your medication:   1) Decrease Lasix to 20MG , 1 tablet by mouth once a day 2) Decrease Carvedilol to 6.25MG , 1 tablet by mouth twice a day  *If you need a refill on your cardiac medications before your next appointment, please call your pharmacy*  Lab Work:  You will have labs drawn today: BMET  If you have labs (blood work) drawn today and your tests are completely normal, you will receive your results only by:  [place] (if you have MyChart) OR  A paper copy in the mail If you have any lab test that is abnormal or we need to change your treatment, we will call you to review the results.  Testing/Procedures:  None ordered today  Follow-Up:  With Kathyrn Drown, NP on 12/07/19 at 10:30AM  Other Instructions   Heart-Healthy Eating Plan Many factors influence your heart (coronary) health, including eating and exercise habits. Coronary risk increases with abnormal blood fat (lipid) levels. Heart-healthy meal planning includes limiting unhealthy fats, increasing healthy fats, and making other diet and lifestyle changes. What is my plan? Your health care provider may recommend that you:  Limit your fat intake to _________% or less of your total calories each day.  Limit your saturated fat intake to _________% or less of your total calories each day.  Limit the amount of cholesterol in your diet to less than _________ mg per day. What are tips for following this plan? Cooking [place] foods using methods other than frying. Baking, boiling, grilling, and broiling are all good options. Other ways to reduce fat include:  Removing the skin from poultry.  Removing all visible fats from meats.  Steaming vegetables in water or broth. Meal planning   At meals, imagine dividing your plate into fourths: ? Fill one-half of your plate with vegetables and green salads. ? Fill one-fourth of  your plate with whole grains. ? Fill one-fourth of your plate with lean protein foods.  Eat 4-5 servings of vegetables per day. One serving equals 1 cup raw or cooked vegetable, or 2 cups raw leafy greens.  Eat 4-5 servings of fruit per day. One serving equals 1 medium whole fruit,  cup dried fruit,  cup fresh, frozen, or canned fruit, or  cup 100% fruit juice.  Eat more foods that contain soluble fiber. Examples include apples, broccoli, carrots, beans, peas, and barley. Aim to get 25-30 g of fiber per day.  Increase your consumption of legumes, nuts, and seeds to 4-5 servings per week. One serving of dried beans or legumes equals  cup cooked, 1 serving of nuts is  cup, and 1 serving of seeds equals 1 tablespoon. Fats  Choose healthy fats more often. Choose monounsaturated and polyunsaturated fats, such as olive and canola oils, flaxseeds, walnuts, almonds, and seeds.  Eat more omega-3 fats. Choose salmon, mackerel, sardines, tuna, flaxseed oil, and ground flaxseeds. Aim to eat fish at least 2 times each week.  Check food labels carefully to identify foods with trans fats or high amounts of saturated fat.  Limit saturated fats. These are found in animal products, such as meats, butter, and cream. Plant sources of saturated fats include palm oil, palm kernel oil, and coconut oil.  Avoid foods with partially hydrogenated oils in them. These contain trans fats. Examples are stick margarine, some tub margarines, cookies, crackers, and other baked goods.  Avoid fried foods. General information  Eat more home-cooked food  and less restaurant, buffet, and fast food.  Limit or avoid alcohol.  Limit foods that are high in starch and sugar.  Lose weight if you are overweight. Losing just 5-10% of your body weight can help your overall health and prevent diseases such as diabetes and heart disease.  Monitor your salt (sodium) intake, especially if you have high blood pressure. Talk with  your health care provider about your sodium intake.  Try to incorporate more vegetarian meals weekly. What foods can I eat? Fruits All fresh, canned (in natural juice), or frozen fruits. Vegetables Fresh or frozen vegetables (raw, steamed, roasted, or grilled). Green salads. Grains Most grains. Choose whole wheat and whole grains most of the time. Rice and pasta, including brown rice and pastas made with whole wheat. Meats and other proteins Lean, well-trimmed beef, veal, pork, and lamb. Chicken and [place] without skin. All fish and shellfish. Wild duck, rabbit, pheasant, and venison. Egg whites or low-cholesterol egg substitutes. Dried beans, peas, lentils, and tofu. Seeds and most nuts. Dairy Low-fat or nonfat cheeses, including ricotta and mozzarella. Skim or 1% milk (liquid, powdered, or evaporated). Buttermilk made with low-fat milk. Nonfat or low-fat yogurt. Fats and oils Non-hydrogenated (trans-free) margarines. Vegetable oils, including soybean, sesame, sunflower, olive, peanut, safflower, corn, canola, and cottonseed. Salad dressings or mayonnaise made with a vegetable oil. Beverages Water (mineral or sparkling). Coffee and tea. Diet carbonated beverages. Sweets and desserts Sherbet, gelatin, and fruit ice. Small amounts of dark chocolate. Limit all sweets and desserts. Seasonings and condiments All seasonings and condiments. The items listed above may not be a complete list of foods and beverages you can eat. Contact a dietitian for more options. What foods are not recommended? Fruits Canned fruit in heavy syrup. Fruit in cream or butter sauce. Fried fruit. Limit coconut. Vegetables Vegetables cooked in cheese, cream, or butter sauce. Fried vegetables. Grains Breads made with saturated or trans fats, oils, or whole milk. Croissants. Sweet rolls. Donuts. High-fat crackers, such as cheese crackers. Meats and other proteins Fatty meats, such as hot dogs, ribs, sausage, bacon,  rib-eye roast or steak. High-fat deli meats, such as salami and bologna. Caviar. Domestic duck and goose. Organ meats, such as liver. Dairy Cream, sour cream, cream cheese, and creamed cottage cheese. Whole milk cheeses. Whole or 2% milk (liquid, evaporated, or condensed). Whole buttermilk. Cream sauce or high-fat cheese sauce. Whole-milk yogurt. Fats and oils Meat fat, or shortening. Cocoa butter, hydrogenated oils, palm oil, coconut oil, palm kernel oil. Solid fats and shortenings, including bacon fat, salt pork, lard, and butter. Nondairy cream substitutes. Salad dressings with cheese or sour cream. Beverages Regular sodas and any drinks with added sugar. Sweets and desserts Frosting. Pudding. Cookies. Cakes. Pies. Milk chocolate or white chocolate. Buttered syrups. Full-fat ice cream or ice cream drinks. The items listed above may not be a complete list of foods and beverages to avoid. Contact a dietitian for more information. Summary  Heart-healthy meal planning includes limiting unhealthy fats, increasing healthy fats, and making other diet and lifestyle changes.  Lose weight if you are overweight. Losing just 5-10% of your body weight can help your overall health and prevent diseases such as diabetes and heart disease.  Focus on eating a balance of foods, including fruits and vegetables, low-fat or nonfat dairy, lean protein, nuts and legumes, whole grains, and heart-healthy oils and fats. This information is not intended to replace advice given to you by your health care provider. Make sure you discuss any questions  you have with your health care provider. Document Released: 09/04/2008 Document Revised: 01/03/2018 Document Reviewed: 01/03/2018 Elsevier Patient Education  2020 [employer].

## 2019-10-23 LAB — BASIC METABOLIC PANEL
BUN/Creatinine Ratio: 22 (ref 10–24)
BUN: 33 mg/dL — ABNORMAL HIGH (ref 8–27)
CO2: 29 mmol/L (ref 20–29)
Calcium: 9.8 mg/dL (ref 8.6–10.2)
Chloride: 97 mmol/L (ref 96–106)
Creatinine, Ser: 1.5 mg/dL — ABNORMAL HIGH (ref 0.76–1.27)
GFR calc Af Amer: 51 mL/min/{1.73_m2} — ABNORMAL LOW (ref >59)
GFR calc non Af Amer: 44 mL/min/{1.73_m2} — ABNORMAL LOW (ref >59)
Glucose: 84 mg/dL (ref 65–99)
Potassium: 4.8 mmol/L (ref 3.5–5.2)
Sodium: 138 mmol/L (ref 134–144)

## 2019-11-06 ENCOUNTER — Other Ambulatory Visit (HOSPITAL_COMMUNITY)
Admission: RE | Admit: 2019-11-06 | Discharge: 2019-11-06 | Disposition: A | Discharge status: Home / Self Care | Payer: Medicare Other | Source: Ambulatory Visit | Attending: Surgery | Admitting: Surgery

## 2019-11-06 DIAGNOSIS — Z20828 Contact with and (suspected) exposure to other viral communicable diseases: Secondary | ICD-10-CM | POA: Insufficient documentation

## 2019-11-06 DIAGNOSIS — Z01812 Encounter for preprocedural laboratory examination: Secondary | ICD-10-CM | POA: Diagnosis present

## 2019-11-06 LAB — SARS CORONAVIRUS 2 (TAT 6-24 HRS): SARS Coronavirus 2: NEGATIVE

## 2019-11-10 ENCOUNTER — Encounter (HOSPITAL_COMMUNITY)
Admission: RE | Disposition: A | Discharge status: Home / Self Care | Payer: Self-pay | Source: Home / Self Care | Attending: Surgery

## 2019-11-10 ENCOUNTER — Other Ambulatory Visit: Payer: Self-pay

## 2019-11-10 ENCOUNTER — Ambulatory Visit (HOSPITAL_COMMUNITY)
Admission: RE | Admit: 2019-11-10 | Discharge: 2019-11-10 | Disposition: A | Discharge status: Home / Self Care | Payer: Medicare Other | Source: Home / Self Care | Attending: Surgery | Admitting: Surgery

## 2019-11-10 ENCOUNTER — Encounter (HOSPITAL_COMMUNITY): Payer: Self-pay | Admitting: Surgery

## 2019-11-10 DIAGNOSIS — Z7982 Long term (current) use of aspirin: Secondary | ICD-10-CM | POA: Insufficient documentation

## 2019-11-10 DIAGNOSIS — R0603 Acute respiratory distress: Secondary | ICD-10-CM | POA: Diagnosis not present

## 2019-11-10 DIAGNOSIS — J449 Chronic obstructive pulmonary disease, unspecified: Secondary | ICD-10-CM | POA: Insufficient documentation

## 2019-11-10 DIAGNOSIS — N183 Chronic kidney disease, stage 3 unspecified: Secondary | ICD-10-CM | POA: Insufficient documentation

## 2019-11-10 DIAGNOSIS — E785 Hyperlipidemia, unspecified: Secondary | ICD-10-CM | POA: Insufficient documentation

## 2019-11-10 DIAGNOSIS — I739 Peripheral vascular disease, unspecified: Secondary | ICD-10-CM | POA: Insufficient documentation

## 2019-11-10 DIAGNOSIS — I2510 Atherosclerotic heart disease of native coronary artery without angina pectoris: Secondary | ICD-10-CM | POA: Insufficient documentation

## 2019-11-10 DIAGNOSIS — E039 Hypothyroidism, unspecified: Secondary | ICD-10-CM | POA: Insufficient documentation

## 2019-11-10 DIAGNOSIS — Z79899 Other long term (current) drug therapy: Secondary | ICD-10-CM | POA: Insufficient documentation

## 2019-11-10 DIAGNOSIS — I5042 Chronic combined systolic (congestive) and diastolic (congestive) heart failure: Secondary | ICD-10-CM | POA: Insufficient documentation

## 2019-11-10 DIAGNOSIS — I714 Abdominal aortic aneurysm, without rupture: Secondary | ICD-10-CM | POA: Insufficient documentation

## 2019-11-10 DIAGNOSIS — I70213 Atherosclerosis of native arteries of extremities with intermittent claudication, bilateral legs: Secondary | ICD-10-CM | POA: Diagnosis not present

## 2019-11-10 DIAGNOSIS — I130 Hypertensive heart and chronic kidney disease with heart failure and stage 1 through stage 4 chronic kidney disease, or unspecified chronic kidney disease: Secondary | ICD-10-CM | POA: Insufficient documentation

## 2019-11-10 DIAGNOSIS — I340 Nonrheumatic mitral (valve) insufficiency: Secondary | ICD-10-CM | POA: Insufficient documentation

## 2019-11-10 HISTORY — PX: PERIPHERAL VASCULAR ATHERECTOMY: CATH118256

## 2019-11-10 HISTORY — PX: PERIPHERAL VASCULAR BALLOON ANGIOPLASTY: CATH118281

## 2019-11-10 HISTORY — PX: ABDOMINAL AORTOGRAM W/LOWER EXTREMITY: CATH118223

## 2019-11-10 LAB — POCT I-STAT, CHEM 8
BUN: 35 mg/dL — ABNORMAL HIGH (ref 8–23)
Calcium, Ion: 1.19 mmol/L (ref 1.15–1.40)
Chloride: 106 mmol/L (ref 98–111)
Creatinine, Ser: 1.3 mg/dL — ABNORMAL HIGH (ref 0.61–1.24)
Glucose, Bld: 100 mg/dL — ABNORMAL HIGH (ref 70–99)
HCT: 44 % (ref 39.0–52.0)
Hemoglobin: 15 g/dL (ref 13.0–17.0)
Potassium: 4.7 mmol/L (ref 3.5–5.1)
Sodium: 140 mmol/L (ref 135–145)
TCO2: 24 mmol/L (ref 22–32)

## 2019-11-10 LAB — POCT ACTIVATED CLOTTING TIME
Activated Clotting Time: 224 seconds
Activated Clotting Time: 246 seconds

## 2019-11-10 SURGERY — ABDOMINAL AORTOGRAM W/LOWER EXTREMITY
Anesthesia: LOCAL | Laterality: Left

## 2019-11-10 MED ORDER — HEPARIN SODIUM (PORCINE) 1000 UNIT/ML IJ SOLN
INTRAMUSCULAR | Status: DC | PRN
  Administered 2019-11-10: 6000 [IU] via INTRAVENOUS
  Administered 2019-11-10: 2000 [IU] via INTRAVENOUS

## 2019-11-10 MED ORDER — SODIUM CHLORIDE 0.9% FLUSH: 3.0000 mL | Freq: Two times a day (BID) | INTRAVENOUS | Status: DC

## 2019-11-10 MED ORDER — MIDAZOLAM HCL 2 MG/2ML IJ SOLN
INTRAMUSCULAR | Status: AC
  Filled 2019-11-10: qty 2

## 2019-11-10 MED ORDER — MIDAZOLAM HCL 2 MG/2ML IJ SOLN
INTRAMUSCULAR | Status: DC | PRN
  Administered 2019-11-10 (×2): 1 mg via INTRAVENOUS

## 2019-11-10 MED ORDER — SODIUM CHLORIDE 0.9 % IV SOLN
INTRAVENOUS | Status: DC
  Administered 2019-11-10: 08:00:00 via INTRAVENOUS

## 2019-11-10 MED ORDER — HEPARIN (PORCINE) IN NACL 1000-0.9 UT/500ML-% IV SOLN
INTRAVENOUS | Status: DC | PRN
  Administered 2019-11-10 (×2): 500 mL

## 2019-11-10 MED ORDER — SODIUM CHLORIDE 0.9 % IV SOLN: 250.0000 mL | INTRAVENOUS | Status: DC | PRN

## 2019-11-10 MED ORDER — FENTANYL CITRATE (PF) 100 MCG/2ML IJ SOLN
INTRAMUSCULAR | Status: AC
  Filled 2019-11-10: qty 2

## 2019-11-10 MED ORDER — IODIXANOL 320 MG/ML IV SOLN
INTRAVENOUS | Status: DC | PRN
  Administered 2019-11-10: 160 mL

## 2019-11-10 MED ORDER — HEPARIN SODIUM (PORCINE) 1000 UNIT/ML IJ SOLN
INTRAMUSCULAR | Status: AC
  Filled 2019-11-10: qty 1

## 2019-11-10 MED ORDER — LIDOCAINE HCL (PF) 1 % IJ SOLN
INTRAMUSCULAR | Status: DC | PRN
  Administered 2019-11-10: 15 mL via INTRADERMAL

## 2019-11-10 MED ORDER — NITROGLYCERIN IN D5W 200-5 MCG/ML-% IV SOLN
INTRAVENOUS | Status: AC
  Filled 2019-11-10: qty 250

## 2019-11-10 MED ORDER — VIPERSLIDE LUBRICANT OPTIME
TOPICAL | Status: DC | PRN
  Administered 2019-11-10: 11:00:00 via SURGICAL_CAVITY

## 2019-11-10 MED ORDER — LIDOCAINE HCL (PF) 1 % IJ SOLN
INTRAMUSCULAR | Status: AC
  Filled 2019-11-10: qty 30

## 2019-11-10 MED ORDER — VERAPAMIL HCL 2.5 MG/ML IV SOLN
INTRAVENOUS | Status: AC
  Filled 2019-11-10: qty 2

## 2019-11-10 MED ORDER — SODIUM CHLORIDE 0.9 % WEIGHT BASED INFUSION: 1.0000 mL/kg/h | INTRAVENOUS | Status: DC

## 2019-11-10 MED ORDER — SODIUM CHLORIDE 0.9% FLUSH: 3.0000 mL | INTRAVENOUS | Status: DC | PRN

## 2019-11-10 MED ORDER — FENTANYL CITRATE (PF) 100 MCG/2ML IJ SOLN
INTRAMUSCULAR | Status: DC | PRN
  Administered 2019-11-10 (×2): 25 ug via INTRAVENOUS

## 2019-11-10 MED ORDER — HEPARIN (PORCINE) IN NACL 1000-0.9 UT/500ML-% IV SOLN
INTRAVENOUS | Status: AC
  Filled 2019-11-10: qty 1000

## 2019-11-10 SURGICAL SUPPLY — 26 items
BALLN IN.PACT DCB 4X40 (BALLOONS) ×3
BALLN SAPPHIRE 3.0X20 (BALLOONS) ×3
BALLOON SAPPHIRE 3.0X20 (BALLOONS) IMPLANT
CATH CXI 2.6F 65 ST (CATHETERS) ×3
CATH OMNI FLUSH 5F 65CM (CATHETERS) ×1 IMPLANT
CATH SPRT STRG 65X2.6FR ACPT (CATHETERS) IMPLANT
CLOSURE MYNX CONTROL 5F (Vascular Products) ×1 IMPLANT
CLOSURE MYNX CONTROL 6F/7F (Vascular Products) ×1 IMPLANT
DCB IN.PACT 4X40 (BALLOONS) IMPLANT
DEVICE EMBOSHIELD NAV6 2.5-4.8 (FILTER) ×1 IMPLANT
DIAMONDBACK SOLID OAS 1.5MM (CATHETERS) ×3
KIT ENCORE 26 ADVANTAGE (KITS) ×1 IMPLANT
KIT MICROPUNCTURE NIT STIFF (SHEATH) ×1 IMPLANT
KIT PV (KITS) ×3 IMPLANT
LUBRICANT VIPERSLIDE CORONARY (MISCELLANEOUS) ×1 IMPLANT
SHEATH PINNACLE 5F 10CM (SHEATH) ×1 IMPLANT
SHEATH PINNACLE 6F 10CM (SHEATH) ×1 IMPLANT
SHEATH PINNACLE MP 6F 45CM (SHEATH) ×1 IMPLANT
SHIELD RADPAD SCOOP 12X17 (MISCELLANEOUS) ×1 IMPLANT
SYR MEDRAD MARK 7 150ML (SYRINGE) ×3 IMPLANT
SYSTEM DIMNDBCK SLD OAS 1.5MM (CATHETERS) IMPLANT
TRANSDUCER W/STOPCOCK (MISCELLANEOUS) ×3 IMPLANT
TRAY PV CATH (CUSTOM PROCEDURE TRAY) ×3 IMPLANT
WIRE BENTSON .035X145CM (WIRE) ×1 IMPLANT
WIRE G V18X300CM (WIRE) ×2 IMPLANT
WIRE VIPER ADVANCE .017X335CM (WIRE) ×2 IMPLANT

## 2019-11-10 NOTE — Interval H&P Note (Signed)
History and Physical Interval Note:  11/10/2019 9:23 AM  Glen Green  has presented today for surgery, with the diagnosis of pvd.  The various methods of treatment have been discussed with the patient and family. After consideration of risks, benefits and other options for treatment, the patient has consented to  Procedure(s): ABDOMINAL AORTOGRAM W/LOWER EXTREMITY (N/A) as a surgical intervention.  The patient's history has been reviewed, patient examined, no change in status, stable for surgery.  I have reviewed the patient's chart and labs.  Questions were answered to the patient's satisfaction.     Annamarie Major

## 2019-11-10 NOTE — Discharge Instructions (Signed)
Femoral Site Care °This sheet gives you information about how to care for yourself after your procedure. Your health care provider may also give you more specific instructions. If you have problems or questions, contact your health care provider. °What can I expect after the procedure? °After the procedure, it is common to have: °· Bruising that usually fades within 1-2 weeks. °· Tenderness at the site. °Follow these instructions at home: °Wound care °· Follow instructions from your health care provider about how to take care of your insertion site. Make sure you: °? Wash your hands with soap and water before you change your bandage (dressing). If soap and water are not available, use hand sanitizer. °? Change your dressing as told by your health care provider. °? Leave stitches (sutures), skin glue, or adhesive strips in place. These skin closures may need to stay in place for 2 weeks or longer. If adhesive strip edges start to loosen and curl up, you may trim the loose edges. Do not remove adhesive strips completely unless your health care provider tells you to do that. °· Do not take baths, swim, or use a hot tub until your health care provider approves. °· You may shower 24-48 hours after the procedure or as told by your health care provider. °? Gently wash the site with plain soap and water. °? Pat the area dry with a clean towel. °? Do not rub the site. This may cause bleeding. °· Do not apply powder or lotion to the site. Keep the site clean and dry. °· Check your femoral site every day for signs of infection. Check for: °? Redness, swelling, or pain. °? Fluid or blood. °? Warmth. °? Pus or a bad smell. °Activity °· For the first 2-3 days after your procedure, or as long as directed: °? Avoid climbing stairs as much as possible. °? Do not squat. °· Do not lift anything that is heavier than 10 lb (4.5 kg), or the limit that you are told, until your health care provider says that it is safe. °· Rest as  directed. °? Avoid sitting for a long time without moving. Get up to take short walks every 1-2 hours. °· Do not drive for 24 hours if you were given a medicine to help you relax (sedative). °General instructions °· Take over-the-counter and prescription medicines only as told by your health care provider. °· Keep all follow-up visits as told by your health care provider. This is important. °Contact a health care provider if you have: °· A fever or chills. °· You have redness, swelling, or pain around your insertion site. °Get help right away if: °· The catheter insertion area swells very fast. °· You pass out. °· You suddenly start to sweat or your skin gets clammy. °· The catheter insertion area is bleeding, and the bleeding does not stop when you hold steady pressure on the area. °· The area near or just beyond the catheter insertion site becomes pale, cool, tingly, or numb. °These symptoms may represent a serious problem that is an emergency. Do not wait to see if the symptoms will go away. Get medical help right away. Call your local emergency services (911 in the [place] Do not drive yourself to the hospital. °Summary °· After the procedure, it is common to have bruising that usually fades within 1-2 weeks. °· Check your femoral site every day for signs of infection. °· Do not lift anything that is heavier than 10 lb (4.5 kg), or the   limit that you are told, until your health care provider says that it is safe. °This information is not intended to replace advice given to you by your health care provider. Make sure you discuss any questions you have with your health care provider. °Document Released: 07/30/2014 Document Revised: 12/09/2017 Document Reviewed: 12/09/2017 °Elsevier Patient Education © 2020 Elsevier Inc. ° °

## 2019-11-10 NOTE — Progress Notes (Signed)
Pt ambulated to bathroom, standby assist. Tolerated ambulation and voided without difficulty. No new bleeding noted.

## 2019-11-10 NOTE — Op Note (Signed)
Patient name: Glen Green MRN: [id number] DOB: 04-30-1941 Sex: male  11/10/2019 Pre-operative Diagnosis: claudication Post-operative diagnosis:  Same Surgeon:  Annamarie Major Procedure Performed:  1.  Ultrasound-guided access, right femoral artery  2.  Ultrasound-guided access, left femoral artery  3.  Abdominal aortogram  4.  Bilateral lower extremity runoff  5.  Atherectomy with drug-coated balloon angioplasty, left popliteal artery  6.  Atherectomy with balloon angioplasty, left tibioperoneal trunk  7.  Conscious sedation, with 115 minutes  8.  Closure device, Mynx x2   Indications: Patient has a history of a fenestrated endovascular aneurysm repair.  He has developed progressive claudication symptoms.  He is now having difficulty walking on the left leg.  He comes in today for further evaluation of possible intervention.  Procedure:  The patient was identified in the holding area and taken to room 8.  The patient was then placed supine on the table and prepped and draped in the usual sterile fashion.  A time out was called.  Conscious sedation was administered with the use of IV fentanyl and Versed under continuous physician and nurse monitoring.  Heart rate, blood pressure, and oxygen saturation were continuously monitored.  Total sedation time was 156minutes.  Ultrasound was used to evaluate the right common femoral artery.  It was patent .  A digital ultrasound image was acquired.  A micropuncture needle was used to access the right common femoral artery under ultrasound guidance.  An 018 wire was advanced without resistance and a micropuncture sheath was placed.  The 018 wire was removed and a benson wire was placed.  The micropuncture sheath was exchanged for a 5 french sheath.  An omniflush catheter was advanced over the wire to the level of L-1.  An abdominal angiogram was obtained.  Next the catheter was pulled out of aorta bifurcation and bilateral runoff was performed. Findings:    Aortogram: Endovascular aneurysm stent is visualized.  The renal artery stents are widely patent.  The superior mesenteric artery is widely patent.  No evidence of endoleak is identified.  Bilateral external iliac arteries are widely patent  Right Lower Extremity: The right common femoral artery is widely patent.  The profunda femoris vein throughout its course.  The superficial femoral artery is heavily calcified and occludes at the level of the patella with reconstitution of the tibioperoneal trunk.  Two-vessel runoff is seen via the posterior tibial and peroneal artery.  Left Lower Extremity: The left common femoral and profundofemoral artery pain throughout their course.  There is calcification and stenosis within the distal common femoral and proximal superficial femoral artery.  The superficial femoral artery is calcified but patent throughout its course.  There is a short segment occlusion associated with a heavily calcified exophytic plaque at the level of the joint space.  There is reconstitution of the below-knee popliteal artery and three-vessel runoff.  Intervention: After the above images were acquired the decision made to proceed with intervention.  Antegrade access into the left common femoral artery was performed under ultrasound guidance.  A 6 French 45 cm sheath was then advanced into the superficial femoral artery.  The patient was fully heparinized.  I used a V 18 and a 018 catheter to cross the lesion.  The wire was directed into the posterior tibial artery.  A Viper wire was then inserted followed by a small nab 6 filter which was deployed in the distal below-knee popliteal artery.  I then used a 1.5 solid CSI atherectomy device  and performed atherectomy of the popliteal artery at low medium and high speeds.  I then selected a 4 x 40 Medtronic drug-coated balloon to perform balloon angioplasty.  This was done at nominal pressure for 2 minutes.  Follow-up imaging revealed resolution of  the stenosis.  It was at this point that the distal below-knee popliteal artery looked like it needed to be addressed because the stenosis was greater than 80%.  I removed the filter and then reengage the posterior tibial artery with a V 18 wire.  Using a catheter a Viper wire was placed.  I then used the 1.5 CSI device to perform atherectomy only at low speed of the distal popliteal and tibioperoneal trunk.  A 3 x 20 balloon was used to perform primary balloon angioplasty of this area.  This was done for 2 minutes.  Follow-up revealed inline flow with three-vessel runoff to the ankle.  The patient has persistent disease out onto his foot.  The left groin was closed with a minx  Impression:  #1  Short segment occlusion of the left popliteal artery treated with atherectomy using a CSI 1.5 solid device and 4 x 40 drug-coated balloon.  There was no residual stenosis  #2  80% stenosis of the distal popliteal and proximal tibioperoneal trunk treated with CSI atherectomy using a 1.5 device and balloon angioplasty using a 3 x 20 balloon.  #3  Right popliteal occlusion at the level of the patella with reconstitution of the tibioperoneal trunk.    Theotis Burrow, M.D., [facility] [place] Office: [phone] Pager:  [phone]

## 2019-11-12 ENCOUNTER — Inpatient Hospital Stay (HOSPITAL_COMMUNITY)
Admission: EM | Admit: 2019-11-12 | Discharge: 2019-12-11 | DRG: 270 | Disposition: E | Payer: Medicare Other | Attending: Critical Care Medicine | Admitting: Critical Care Medicine

## 2019-11-12 ENCOUNTER — Inpatient Hospital Stay: Payer: Self-pay

## 2019-11-12 ENCOUNTER — Inpatient Hospital Stay (HOSPITAL_COMMUNITY): Payer: Medicare Other | Admitting: Certified Registered Nurse Anesthetist

## 2019-11-12 ENCOUNTER — Emergency Department (HOSPITAL_COMMUNITY): Payer: Medicare Other

## 2019-11-12 ENCOUNTER — Encounter (HOSPITAL_COMMUNITY): Payer: Self-pay

## 2019-11-12 ENCOUNTER — Telehealth: Payer: Self-pay | Admitting: Emergency Medicine

## 2019-11-12 ENCOUNTER — Other Ambulatory Visit: Payer: Self-pay

## 2019-11-12 DIAGNOSIS — I50813 Acute on chronic right heart failure: Secondary | ICD-10-CM | POA: Diagnosis not present

## 2019-11-12 DIAGNOSIS — E875 Hyperkalemia: Secondary | ICD-10-CM | POA: Diagnosis present

## 2019-11-12 DIAGNOSIS — Z79899 Other long term (current) drug therapy: Secondary | ICD-10-CM

## 2019-11-12 DIAGNOSIS — Z8673 Personal history of transient ischemic attack (TIA), and cerebral infarction without residual deficits: Secondary | ICD-10-CM | POA: Diagnosis not present

## 2019-11-12 DIAGNOSIS — J449 Chronic obstructive pulmonary disease, unspecified: Secondary | ICD-10-CM | POA: Diagnosis present

## 2019-11-12 DIAGNOSIS — I959 Hypotension, unspecified: Secondary | ICD-10-CM | POA: Diagnosis not present

## 2019-11-12 DIAGNOSIS — R0603 Acute respiratory distress: Secondary | ICD-10-CM | POA: Diagnosis present

## 2019-11-12 DIAGNOSIS — J432 Centrilobular emphysema: Secondary | ICD-10-CM | POA: Diagnosis present

## 2019-11-12 DIAGNOSIS — I130 Hypertensive heart and chronic kidney disease with heart failure and stage 1 through stage 4 chronic kidney disease, or unspecified chronic kidney disease: Principal | ICD-10-CM | POA: Diagnosis present

## 2019-11-12 DIAGNOSIS — I110 Hypertensive heart disease with heart failure: Principal | ICD-10-CM | POA: Diagnosis present

## 2019-11-12 DIAGNOSIS — I4891 Unspecified atrial fibrillation: Secondary | ICD-10-CM | POA: Diagnosis present

## 2019-11-12 DIAGNOSIS — I255 Ischemic cardiomyopathy: Secondary | ICD-10-CM | POA: Diagnosis present

## 2019-11-12 DIAGNOSIS — I469 Cardiac arrest, cause unspecified: Secondary | ICD-10-CM

## 2019-11-12 DIAGNOSIS — Z951 Presence of aortocoronary bypass graft: Secondary | ICD-10-CM | POA: Diagnosis not present

## 2019-11-12 DIAGNOSIS — Z8501 Personal history of malignant neoplasm of esophagus: Secondary | ICD-10-CM

## 2019-11-12 DIAGNOSIS — N183 Chronic kidney disease, stage 3 unspecified: Secondary | ICD-10-CM | POA: Diagnosis present

## 2019-11-12 DIAGNOSIS — I5043 Acute on chronic combined systolic (congestive) and diastolic (congestive) heart failure: Secondary | ICD-10-CM | POA: Diagnosis present

## 2019-11-12 DIAGNOSIS — Z87891 Personal history of nicotine dependence: Secondary | ICD-10-CM

## 2019-11-12 DIAGNOSIS — Z85118 Personal history of other malignant neoplasm of bronchus and lung: Secondary | ICD-10-CM | POA: Diagnosis not present

## 2019-11-12 DIAGNOSIS — Z20828 Contact with and (suspected) exposure to other viral communicable diseases: Secondary | ICD-10-CM | POA: Diagnosis present

## 2019-11-12 DIAGNOSIS — Z7982 Long term (current) use of aspirin: Secondary | ICD-10-CM

## 2019-11-12 DIAGNOSIS — N179 Acute kidney failure, unspecified: Secondary | ICD-10-CM | POA: Diagnosis not present

## 2019-11-12 DIAGNOSIS — Z9049 Acquired absence of other specified parts of digestive tract: Secondary | ICD-10-CM

## 2019-11-12 DIAGNOSIS — I2510 Atherosclerotic heart disease of native coronary artery without angina pectoris: Secondary | ICD-10-CM | POA: Diagnosis present

## 2019-11-12 DIAGNOSIS — Z8679 Personal history of other diseases of the circulatory system: Secondary | ICD-10-CM | POA: Diagnosis not present

## 2019-11-12 DIAGNOSIS — I6529 Occlusion and stenosis of unspecified carotid artery: Secondary | ICD-10-CM | POA: Diagnosis present

## 2019-11-12 DIAGNOSIS — Z8249 Family history of ischemic heart disease and other diseases of the circulatory system: Secondary | ICD-10-CM

## 2019-11-12 DIAGNOSIS — I5023 Acute on chronic systolic (congestive) heart failure: Secondary | ICD-10-CM

## 2019-11-12 DIAGNOSIS — J9601 Acute respiratory failure with hypoxia: Secondary | ICD-10-CM | POA: Diagnosis present

## 2019-11-12 DIAGNOSIS — E785 Hyperlipidemia, unspecified: Secondary | ICD-10-CM | POA: Diagnosis present

## 2019-11-12 DIAGNOSIS — R579 Shock, unspecified: Secondary | ICD-10-CM

## 2019-11-12 DIAGNOSIS — I447 Left bundle-branch block, unspecified: Secondary | ICD-10-CM | POA: Diagnosis present

## 2019-11-12 DIAGNOSIS — I739 Peripheral vascular disease, unspecified: Secondary | ICD-10-CM | POA: Diagnosis present

## 2019-11-12 DIAGNOSIS — D649 Anemia, unspecified: Secondary | ICD-10-CM | POA: Diagnosis present

## 2019-11-12 DIAGNOSIS — N1831 Chronic kidney disease, stage 3a: Secondary | ICD-10-CM | POA: Diagnosis present

## 2019-11-12 DIAGNOSIS — N19 Unspecified kidney failure: Secondary | ICD-10-CM

## 2019-11-12 DIAGNOSIS — Z902 Acquired absence of lung [part of]: Secondary | ICD-10-CM

## 2019-11-12 DIAGNOSIS — Z66 Do not resuscitate: Secondary | ICD-10-CM | POA: Diagnosis not present

## 2019-11-12 DIAGNOSIS — Z888 Allergy status to other drugs, medicaments and biological substances status: Secondary | ICD-10-CM

## 2019-11-12 DIAGNOSIS — Z7902 Long term (current) use of antithrombotics/antiplatelets: Secondary | ICD-10-CM

## 2019-11-12 DIAGNOSIS — I272 Pulmonary hypertension, unspecified: Secondary | ICD-10-CM | POA: Diagnosis present

## 2019-11-12 DIAGNOSIS — Z9841 Cataract extraction status, right eye: Secondary | ICD-10-CM

## 2019-11-12 DIAGNOSIS — E039 Hypothyroidism, unspecified: Secondary | ICD-10-CM | POA: Diagnosis present

## 2019-11-12 DIAGNOSIS — Z9842 Cataract extraction status, left eye: Secondary | ICD-10-CM

## 2019-11-12 DIAGNOSIS — Z833 Family history of diabetes mellitus: Secondary | ICD-10-CM

## 2019-11-12 DIAGNOSIS — R570 Cardiogenic shock: Secondary | ICD-10-CM | POA: Diagnosis present

## 2019-11-12 DIAGNOSIS — Z841 Family history of disorders of kidney and ureter: Secondary | ICD-10-CM

## 2019-11-12 DIAGNOSIS — Z85858 Personal history of malignant neoplasm of other endocrine glands: Secondary | ICD-10-CM

## 2019-11-12 DIAGNOSIS — I088 Other rheumatic multiple valve diseases: Secondary | ICD-10-CM | POA: Diagnosis present

## 2019-11-12 DIAGNOSIS — Z961 Presence of intraocular lens: Secondary | ICD-10-CM | POA: Diagnosis present

## 2019-11-12 DIAGNOSIS — J9600 Acute respiratory failure, unspecified whether with hypoxia or hypercapnia: Secondary | ICD-10-CM | POA: Diagnosis present

## 2019-11-12 LAB — POCT I-STAT 7, (LYTES, BLD GAS, ICA,H+H)
Acid-base deficit: 12 mmol/L — ABNORMAL HIGH (ref 0.0–2.0)
Bicarbonate: 12.2 mmol/L — ABNORMAL LOW (ref 20.0–28.0)
Calcium, Ion: 1.16 mmol/L (ref 1.15–1.40)
HCT: 30 % — ABNORMAL LOW (ref 39.0–52.0)
Hemoglobin: 10.2 g/dL — ABNORMAL LOW (ref 13.0–17.0)
O2 Saturation: 100 %
Patient temperature: 98
Potassium: 6.4 mmol/L (ref 3.5–5.1)
Sodium: 133 mmol/L — ABNORMAL LOW (ref 135–145)
TCO2: 13 mmol/L — ABNORMAL LOW (ref 22–32)
pCO2 arterial: 23.4 mmHg — ABNORMAL LOW (ref 32.0–48.0)
pH, Arterial: 7.325 — ABNORMAL LOW (ref 7.350–7.450)
pO2, Arterial: 330 mmHg — ABNORMAL HIGH (ref 83.0–108.0)

## 2019-11-12 LAB — COMPREHENSIVE METABOLIC PANEL
ALT: 30 U/L (ref 0–44)
AST: 42 U/L — ABNORMAL HIGH (ref 15–41)
Albumin: 3.3 g/dL — ABNORMAL LOW (ref 3.5–5.0)
Alkaline Phosphatase: 112 U/L (ref 38–126)
Anion gap: 13 (ref 5–15)
BUN: 35 mg/dL — ABNORMAL HIGH (ref 8–23)
CO2: 20 mmol/L — ABNORMAL LOW (ref 22–32)
Calcium: 9.3 mg/dL (ref 8.9–10.3)
Chloride: 104 mmol/L (ref 98–111)
Creatinine, Ser: 1.66 mg/dL — ABNORMAL HIGH (ref 0.61–1.24)
GFR calc Af Amer: 45 mL/min — ABNORMAL LOW (ref >60)
GFR calc non Af Amer: 39 mL/min — ABNORMAL LOW (ref >60)
Glucose, Bld: 144 mg/dL — ABNORMAL HIGH (ref 70–99)
Potassium: 4.9 mmol/L (ref 3.5–5.1)
Sodium: 137 mmol/L (ref 135–145)
Total Bilirubin: 1.3 mg/dL — ABNORMAL HIGH (ref 0.3–1.2)
Total Protein: 6.6 g/dL (ref 6.5–8.1)

## 2019-11-12 LAB — CBC WITH DIFFERENTIAL/PLATELET
Abs Immature Granulocytes: 0.04 10*3/uL (ref 0.00–0.07)
Basophils Absolute: 0 10*3/uL (ref 0.0–0.1)
Basophils Relative: 0 %
Eosinophils Absolute: 0 10*3/uL (ref 0.0–0.5)
Eosinophils Relative: 0 %
HCT: 28.7 % — ABNORMAL LOW (ref 39.0–52.0)
Hemoglobin: 9.1 g/dL — ABNORMAL LOW (ref 13.0–17.0)
Immature Granulocytes: 0 %
Lymphocytes Relative: 20 %
Lymphs Abs: 2 10*3/uL (ref 0.7–4.0)
MCH: 31.3 pg (ref 26.0–34.0)
MCHC: 31.7 g/dL (ref 30.0–36.0)
MCV: 98.6 fL (ref 80.0–100.0)
Monocytes Absolute: 1.3 10*3/uL — ABNORMAL HIGH (ref 0.1–1.0)
Monocytes Relative: 13 %
Neutro Abs: 6.4 10*3/uL (ref 1.7–7.7)
Neutrophils Relative %: 67 %
Platelets: 192 10*3/uL (ref 150–400)
RBC: 2.91 MIL/uL — ABNORMAL LOW (ref 4.22–5.81)
RDW: 17 % — ABNORMAL HIGH (ref 11.5–15.5)
WBC: 9.8 10*3/uL (ref 4.0–10.5)
nRBC: 0 % (ref 0.0–0.2)

## 2019-11-12 LAB — URINALYSIS, ROUTINE W REFLEX MICROSCOPIC
Bilirubin Urine: NEGATIVE
Glucose, UA: NEGATIVE mg/dL
Hgb urine dipstick: NEGATIVE
Ketones, ur: NEGATIVE mg/dL
Leukocytes,Ua: NEGATIVE
Nitrite: NEGATIVE
Protein, ur: 30 mg/dL — AB
Specific Gravity, Urine: 1.024 (ref 1.005–1.030)
pH: 5 (ref 5.0–8.0)

## 2019-11-12 LAB — BASIC METABOLIC PANEL
Anion gap: 17 — ABNORMAL HIGH (ref 5–15)
BUN: 36 mg/dL — ABNORMAL HIGH (ref 8–23)
CO2: 14 mmol/L — ABNORMAL LOW (ref 22–32)
Calcium: 9.2 mg/dL (ref 8.9–10.3)
Chloride: 106 mmol/L (ref 98–111)
Creatinine, Ser: 2.18 mg/dL — ABNORMAL HIGH (ref 0.61–1.24)
GFR calc Af Amer: 33 mL/min — ABNORMAL LOW (ref >60)
GFR calc non Af Amer: 28 mL/min — ABNORMAL LOW (ref >60)
Glucose, Bld: 99 mg/dL (ref 70–99)
Potassium: 6.3 mmol/L (ref 3.5–5.1)
Sodium: 137 mmol/L (ref 135–145)

## 2019-11-12 LAB — TROPONIN I (HIGH SENSITIVITY)
Troponin I (High Sensitivity): 301 ng/L (ref <18)
Troponin I (High Sensitivity): 760 ng/L
Troponin I (High Sensitivity): 1384 ng/L (ref <18)
Troponin I (High Sensitivity): 1644 ng/L (ref <18)

## 2019-11-12 LAB — CBC
HCT: 32.8 % — ABNORMAL LOW (ref 39.0–52.0)
Hemoglobin: 10 g/dL — ABNORMAL LOW (ref 13.0–17.0)
MCH: 31.3 pg (ref 26.0–34.0)
MCHC: 30.5 g/dL (ref 30.0–36.0)
MCV: 102.5 fL — ABNORMAL HIGH (ref 80.0–100.0)
Platelets: 176 K/uL (ref 150–400)
RBC: 3.2 MIL/uL — ABNORMAL LOW (ref 4.22–5.81)
RDW: 17.4 % — ABNORMAL HIGH (ref 11.5–15.5)
WBC: 10.4 K/uL (ref 4.0–10.5)
nRBC: 0.2 % (ref 0.0–0.2)

## 2019-11-12 LAB — GLUCOSE, CAPILLARY
Glucose-Capillary: 53 mg/dL — ABNORMAL LOW (ref 70–99)
Glucose-Capillary: <10 mg/dL — CL (ref 70–99)

## 2019-11-12 LAB — BRAIN NATRIURETIC PEPTIDE: B Natriuretic Peptide: 1981.8 pg/mL — ABNORMAL HIGH (ref 0.0–100.0)

## 2019-11-12 LAB — TYPE AND SCREEN
ABO/RH(D): A POS
Antibody Screen: NEGATIVE

## 2019-11-12 LAB — POC SARS CORONAVIRUS 2 AG -  ED: SARS Coronavirus 2 Ag: NEGATIVE

## 2019-11-12 LAB — POTASSIUM: Potassium: 6.1 mmol/L — ABNORMAL HIGH (ref 3.5–5.1)

## 2019-11-12 LAB — SARS CORONAVIRUS 2 (TAT 6-24 HRS): SARS Coronavirus 2: NEGATIVE

## 2019-11-12 LAB — D-DIMER, QUANTITATIVE: D-Dimer, Quant: 3.83 ug/mL-FEU — ABNORMAL HIGH (ref 0.00–0.50)

## 2019-11-12 MED ORDER — FUROSEMIDE 10 MG/ML IJ SOLN
60.0000 mg | Freq: Once | INTRAMUSCULAR | Status: AC
  Administered 2019-11-12: 60 mg via INTRAVENOUS
  Filled 2019-11-12: qty 6

## 2019-11-12 MED ORDER — IOHEXOL 350 MG/ML SOLN
100.0000 mL | Freq: Once | INTRAVENOUS | Status: AC | PRN
  Administered 2019-11-12: 100 mL via INTRAVENOUS

## 2019-11-12 MED ORDER — IPRATROPIUM-ALBUTEROL 0.5-2.5 (3) MG/3ML IN SOLN: 3.0000 mL | Freq: Four times a day (QID) | RESPIRATORY_TRACT | Status: DC | PRN

## 2019-11-12 MED ORDER — INSULIN ASPART 100 UNIT/ML IV SOLN: 5.0000 [IU] | Freq: Once | INTRAVENOUS | Status: DC

## 2019-11-12 MED ORDER — ACETAMINOPHEN 325 MG PO TABS: 650.0000 mg | ORAL_TABLET | ORAL | Status: DC | PRN

## 2019-11-12 MED ORDER — SODIUM ZIRCONIUM CYCLOSILICATE 5 G PO PACK
5.0000 g | PACK | Freq: Once | ORAL | Status: DC
  Filled 2019-11-12: qty 1

## 2019-11-12 MED ORDER — EPINEPHRINE 1 MG/10ML IJ SOSY
PREFILLED_SYRINGE | INTRAMUSCULAR | Status: AC
  Filled 2019-11-12: qty 10

## 2019-11-12 MED ORDER — CLOPIDOGREL BISULFATE 75 MG PO TABS: 75.0000 mg | ORAL_TABLET | Freq: Every day | ORAL | Status: DC

## 2019-11-12 MED ORDER — HEPARIN 1000 UNIT/ML FOR PERITONEAL DIALYSIS
1000.0000 [IU] | INTRAMUSCULAR | Status: DC | PRN
  Administered 2019-11-12: 3000 [IU] via INTRAPERITONEAL
  Filled 2019-11-12 (×2): qty 4

## 2019-11-12 MED ORDER — NOREPINEPHRINE 4 MG/250ML-% IV SOLN
0.0000 ug/min | INTRAVENOUS | Status: DC
  Administered 2019-11-12: 4 ug/min via INTRAVENOUS
  Filled 2019-11-12: qty 250

## 2019-11-12 MED ORDER — ASPIRIN EC 81 MG PO TBEC: 81.0000 mg | DELAYED_RELEASE_TABLET | Freq: Every day | ORAL | Status: DC

## 2019-11-12 MED ORDER — DEXTROSE 50 % IV SOLN: 1.0000 | Freq: Once | INTRAVENOUS | Status: DC

## 2019-11-12 MED ORDER — CHLORHEXIDINE GLUCONATE CLOTH 2 % EX PADS: 6.0000 | MEDICATED_PAD | Freq: Every day | CUTANEOUS | Status: DC

## 2019-11-12 MED ORDER — SODIUM CHLORIDE 0.9% FLUSH: 10.0000 mL | INTRAVENOUS | Status: DC | PRN

## 2019-11-12 MED ORDER — CALCIUM GLUCONATE-NACL 1-0.675 GM/50ML-% IV SOLN
1.0000 g | Freq: Once | INTRAVENOUS | Status: AC
  Administered 2019-11-12: 1000 mg via INTRAVENOUS
  Filled 2019-11-12: qty 50

## 2019-11-12 MED ORDER — SODIUM CHLORIDE 0.9 % IV SOLN: 250.0000 mL | INTRAVENOUS | Status: DC

## 2019-11-12 MED ORDER — HEPARIN SODIUM (PORCINE) 5000 UNIT/ML IJ SOLN
5000.0000 [IU] | Freq: Three times a day (TID) | INTRAMUSCULAR | Status: DC
  Filled 2019-11-12: qty 1

## 2019-11-12 MED ORDER — SODIUM BICARBONATE 8.4 % IV SOLN
INTRAVENOUS | Status: AC
  Filled 2019-11-12: qty 150

## 2019-11-12 MED ORDER — SODIUM CHLORIDE 0.9% FLUSH: 10.0000 mL | Freq: Two times a day (BID) | INTRAVENOUS | Status: DC

## 2019-11-12 MED ORDER — ONDANSETRON HCL 4 MG/2ML IJ SOLN
4.0000 mg | Freq: Four times a day (QID) | INTRAMUSCULAR | Status: DC | PRN
  Administered 2019-11-12: 4 mg via INTRAVENOUS
  Filled 2019-11-12: qty 2

## 2019-11-12 MED ORDER — ATORVASTATIN CALCIUM 40 MG PO TABS: 40.0000 mg | ORAL_TABLET | Freq: Every day | ORAL | Status: DC

## 2019-11-13 ENCOUNTER — Telehealth: Payer: Self-pay

## 2019-11-13 MED FILL — Medication: Qty: 1 | Status: AC

## 2019-11-13 NOTE — Telephone Encounter (Signed)
11/13/2019 Recd DC from [place]. Sending to [facility] for Dr. Ruthann Cancer to sign.  11/13/2019 Recd Signed DC from Dr. Ruthann Cancer. Called [facility] to pick up.

## 2019-11-19 ENCOUNTER — Inpatient Hospital Stay: Payer: Medicare Other | Admitting: Emergency Medicine

## 2019-12-07 ENCOUNTER — Ambulatory Visit: Payer: Medicare Other | Admitting: Cardiology

## 2019-12-11 NOTE — ED Notes (Signed)
Dr. Ruthann Cancer notified of elevated potassium in ABG; verbal order taken for stat BMP

## 2019-12-11 NOTE — Telephone Encounter (Signed)
Unfortunately due to her acutely worsened symptoms as well as recurrent nausea and vomiting I believe it is in his best interest to seek emergent evaluation either at an urgent care or an emergency room.I would definitely keep the scheduled appointment of 11/19/2019 with Dr. Lamonte Sakai.  Wyn Quaker, FNP

## 2019-12-11 NOTE — ED Notes (Signed)
EDP came to see pt at the bedside.  Pt is calmer at this time.  Spo2 sensor continues to not read well but pt was placed on NRB 15L due to tachypnea and is calm at this time.  Condom cath in place as pt received lasix

## 2019-12-11 NOTE — ED Notes (Signed)
Dr. Ruthann Cancer notified of troponin value

## 2019-12-11 NOTE — Consult Note (Addendum)
Cardiology Consultation:   Patient ID: Glen Green; [id number]; [birth year]   Admit date: 11-21-19 Date of Consult: 11-21-19  Primary Care Provider: Alroy Green, Glen Jews.Marlou Sa, MD Primary Cardiologist: Glen Furbish, MD 12/15/2018 Glen Drown, NP, 10/22/2019 Primary Electrophysiologist:  None   Patient Profile:   Glen Green is a 79 y.o. male with a hx of S-D-CHF, lung and esophageal Green s/p esophagectomy (2004) and upper left lobectomy, AAA s/p FEVAR, CVA, CAD s/p CABG x2 2014, who is being seen today for the evaluation of CHF at the request of Dr Glen Green.  History of Present Illness:   Glen Green was in the hospital from 11/03-11/06 for CHF exacerbation. D/c wt 125 lbs.  He was seen by Ms Glen Green on 11/12, breathing well, wt 122 lbs. Mass on CXR was to be assessed by Dr Glen Green.   He was in the hospital 12/01 for PV cath by Dr. Trula Slade.  He tolerated the procedure well and was discharged the same day.  On 12/2, he said he was doing well and had no issues or concerns.  He wakened at approximately 3:30 AM on 12/3 with shortness of breath.  He did not have any chest pain.  He denies fevers or chills.  He called the pulmonary doctor's office and his oxygen saturation was 97% on room air at that time.  His symptoms did not improve on home meds, he called EMS and was transported to the emergency room.  Although he did not have chest pain, he was having some problems with nausea and vomiting.  In the emergency room, they tried to put him on BiPAP at first, but because of the vomiting they could not.  He is to be started on BiPAP after getting Zofran.  He was hypotensive on arrival to the hospital and the hypotension worsened here.  Systolic blood pressure is currently in the 60s, he is being started on Levophed.  Glen Green is currently denying chest pain.  He does feel short of breath and extremely weak.  He is a little lightheaded.  He is not having any chest pain.  He is not febrile and  his Covid test is negative.   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 repair  1994   Chronic combined systolic (congestive) and diastolic (congestive) heart failure (HCC)    Dyslipidemia    History of esophageal Green    s/p transhiatal esophagogastrectomy   History of lung Green    Hypothyroidism    Pneumonia    Postoperative atrial fibrillation ([place]) 12/04/2013   Short course of amiodarone, resolved. Postop bypass   SBO (small bowel obstruction) ([place]) 08/15/2012   History of small bowel obstruction (status post small bowel       resection, lysis of adhesions, incidental appendectomy, and repair       of left diaphragmatic hernia   Stroke ([place]) 1995   denies residual    Past Surgical History:  Procedure Laterality Date   ABDOMINAL AORTIC ENDOVASCULAR FENESTRATED STENT GRAFT N/A 02/25/2019   Procedure: ABDOMINAL AORTIC ENDOVASCULAR FENESTRATED STENT GRAFT;  Surgeon: Serafina Mitchell, MD;  Location: [facility] OR;  Service: Vascular;  Laterality: N/A;   ABDOMINAL AORTOGRAM W/LOWER EXTREMITY Bilateral 11/10/2019   Procedure: ABDOMINAL AORTOGRAM W/LOWER EXTREMITY;  Surgeon: Serafina Mitchell, MD;  Location: [facility];  Service: Cardiovascular;  Laterality: Bilateral;   BRAIN SURGERY     brain aneursyn   CATARACT EXTRACTION W/ INTRAOCULAR LENS  IMPLANT, BILATERAL     CHOLECYSTECTOMY     CORONARY ARTERY BYPASS GRAFT N/A 07/30/2013   Procedure: CORONARY ARTERY BYPASS GRAFTING (CABG) times two on pump using left internal mammary artery and left greater saphenous vein via endovein harvest.;  Surgeon: Gaye Pollack, MD;  Location: [facility] OR;  Service: Open  Heart Surgery;  Laterality: N/A;   Exploratory laparotomy, lysis of adhesions, reduce of incarcerated small bowel and colon from the chest, limited small bowel resection, incidental appendectomy, and then repair of diaphragmatic hernia with AlloDerm mesh  10/27/2009   [place]   Exploratory laparotomy,exploratory thoracotomy for hemorrhage  02/11/2003   [facility]   LEFT HEART CATHETERIZATION WITH CORONARY ANGIOGRAM N/A 07/30/2013   Procedure: LEFT HEART CATHETERIZATION WITH CORONARY ANGIOGRAM;  Surgeon: Peter M Martinique, MD;  Location: [facility] CATH LAB;  Service: Cardiovascular;  Laterality: N/A;   Left subclavian Port- A-Cath insertion  03/09/2004   [place]   left upper lobectomy with node dissection  02/24/2010   [place]   PERIPHERAL VASCULAR ATHERECTOMY Left 11/10/2019   Procedure: PERIPHERAL VASCULAR ATHERECTOMY;  Surgeon: Serafina Mitchell, MD;  Location: [facility];  Service: Cardiovascular;  Laterality: Left;  SFA and TP TRUNK   PERIPHERAL VASCULAR BALLOON ANGIOPLASTY Left 11/10/2019   Procedure: PERIPHERAL VASCULAR BALLOON ANGIOPLASTY;  Surgeon: Serafina Mitchell, MD;  Location: [facility];  Service: Cardiovascular;  Laterality: Left;  SFA and TP TRUNK   TONSILLECTOMY     Transhiatal esophagectomy with cholecystectomy, jejunostomy,  pyloroplasty and removal of right subclavian Port-A- Cath     [place]     Prior to Admission medications   Medication Sig Start Date End Date Taking? Authorizing Provider  acetaminophen (TYLENOL) 500 MG tablet Take 1,000 mg by mouth every 6 (six) hours as needed for mild pain or headache.    [provider]  aspirin 81 MG tablet Take 1 tablet (81 mg total) by mouth daily. 12/04/13   Jerline Pain, MD  atorvastatin (LIPITOR) 40 MG tablet Take 40 mg by mouth daily.    [provider]  carvedilol (COREG) 6.25 MG tablet Take 1 tablet (6.25 mg total) by mouth 2 (two) times daily. 10/22/19   Glen Green D, NP  clopidogrel (PLAVIX) 75  MG tablet Take 75 mg by mouth daily.    [provider]  furosemide (LASIX) 20 MG tablet Take 1 tablet (20 mg total) by mouth daily. 10/22/19 01/20/20  Glen Green D, NP  guaiFENesin (MUCINEX) 600 MG 12 hr tablet Take 600 mg by mouth 2 (two) times daily as needed for to loosen phlegm.     [provider]  Tiotropium Bromide-Olodaterol (STIOLTO RESPIMAT) 2.5-2.5 MCG/ACT AERS Inhale 2 puffs into the lungs daily.    [provider]    Inpatient Medications: Scheduled Meds:  aspirin EC  81 mg Oral Daily   atorvastatin  40 mg Oral Daily   clopidogrel  75 mg Oral Daily   heparin  5,000 Units Subcutaneous Q8H   Continuous Infusions:  calcium gluconate     PRN Meds: acetaminophen, ipratropium-albuterol, ondansetron (ZOFRAN) IV  Allergies:    Allergies  Allergen Reactions  Quinolones Other (See Comments)    Patient was warned about not using Cipro and similar antibiotics. Recent studies have raised concern that fluoroquinolone antibiotics could be associated with an increased risk of aortic aneurysm Fluoroquinolones have non-antimicrobial properties that might jeopardise the integrity of the extracellular matrix of the vascular wall In a  propensity score matched cohort study in [place], there was a 66% increased rate of aortic aneurysm or dissection associated with oral fluoroquinolone use, compared wit    Social History:   Social History   Socioeconomic History   Marital status: Married    Spouse name: Not on file   Number of children: Not on file   Years of education: Not on file   Highest education level: Not on file  Occupational History   Occupation: retired  Scientist, product/process development strain: Not on file   Food insecurity    Worry: Not on file    Inability: Not on file   Transportation needs    Medical: Not on file    Non-medical: Not on file  Tobacco Use   Smoking status: Former Smoker    Types: Cigarettes    Quit  date: 12/10/2002    Years since quitting: 16.9   Smokeless tobacco: Never Used  Substance and Sexual Activity   Alcohol use: No   Drug use: No   Sexual activity: Not Currently  Lifestyle   Physical activity    Days per week: Not on file    Minutes per session: Not on file   Stress: Not on file  Relationships   Social connections    Talks on phone: Not on file    Gets together: Not on file    Attends religious service: Not on file    Active member of club or organization: Not on file    Attends meetings of clubs or organizations: Not on file    Relationship status: Not on file   Intimate partner violence    Fear of current or ex partner: Not on file    Emotionally abused: Not on file    Physically abused: Not on file    Forced sexual activity: Not on file  Other Topics Concern   Not on file  Social History Narrative   Pt lives with wife.     Family History:   Family History  Problem Relation Age of Onset   Aneurysm Mother    Kidney disease Father    Heart disease Maternal Uncle    Heart disease Paternal Uncle    Aneurysm Maternal Grandmother    Diabetes Sister    Family Status:  Family Status  Relation Name Status   Mother  Deceased at age 43       Aneurysm   Father  Deceased at age 66       Kidney failure   Mat Uncle  Deceased   Pat Uncle  Deceased   MGM  Deceased   Sister  (Not Specified)   MGF  Deceased   PGM  Deceased   PGF  Deceased    ROS:  Please see the history of present illness.  All other ROS reviewed and negative.     Physical Exam/Data:   Vitals:   Dec 08, 2019 1628 12/08/19 1632 12/08/2019 1730 12-08-2019 1740  BP:  95/74    Pulse:   78 79  Resp:  (!) 32 (!) 34 (!) 43  Temp: (!) 96.9 F (36.1 C)     TempSrc: Rectal  SpO2:      Weight:      Height:       No intake or output data in the 24 hours ending Nov 27, 2019 1816 Filed Weights   Nov 27, 2019 1227  Weight: 60 kg   Body mass index is 18.98 kg/m.  General:  Well  nourished, well developed, male in no acute distress HEENT: normal Lymph: no adenopathy Neck: JVD -elevated, right greater than left, up to 10 cm, positive hepatojugular reflux Endocrine:  No thryomegaly Vascular: No carotid bruits; 4/4 extremity pulses 1+  Cardiac:  normal S1, S2; RRR; no murmur Lungs: Rales bilaterally, no wheezing, rhonchi   Abd: soft, a little tender, no hepatomegaly  Ext: no edema Musculoskeletal:  No deformities, BUE and BLE strength weak but equal Skin: warm and dry  Neuro:  CNs 2-12 intact, no focal abnormalities noted Psych:  Normal affect   EKG:  The EKG was personally reviewed and demonstrates:  11/04, SR, HR 79, LBBB is old Telemetry:  Telemetry was personally reviewed and demonstrates:  SR, ST   CV studies:   ECHO: 10/13/2019 Echo 10/13/2019 IMPRESSIONS   1. Left ventricular ejection fraction, by visual estimation, is 40 to 45%. The left ventricle has mild to moderately decreased function. There is no left ventricular hypertrophy. 2. Left ventricular diastolic function could not be evaluated. 3. Global right ventricle has normal systolic function.The right ventricular size is normal. 4. Left atrial size was mildly dilated. 5. Right atrial size was normal. 6. Mild mitral annular calcification. 7. The mitral valve is normal in structure. Severe mitral valve regurgitation. No evidence of mitral stenosis. 8. The tricuspid valve is normal in structure. Tricuspid valve regurgitation is severe. 9. The aortic valve is tricuspid. Aortic valve regurgitation is not visualized. Mild to moderate aortic valve sclerosis/calcification without any evidence of aortic stenosis. 10. The pulmonic valve was normal in structure. Pulmonic valve regurgitation is mild. 11. Moderately elevated pulmonary artery systolic pressure. 12. The inferior vena cava is dilated in size with >50% respiratory variability, suggesting right atrial pressure of 8 mmHg. 13. Mild to  moderate global reduction in LV systolic function; sclerotic aortic valve; severe, posteriorly directed MR; mild LAE; severe TR; moderate pulmonary hypertension.  FINDINGS Left Ventricle: Left ventricular ejection fraction, by visual estimation, is 40 to 45%. The left ventricle has mild to moderately decreased function. There is no left ventricular hypertrophy. Left ventricular diastolic function could not be evaluated.  Normal left atrial pressure.  PV CATH: 11/10/2019 Procedure Performed:             1.  Ultrasound-guided access, right femoral artery             2.  Ultrasound-guided access, left femoral artery             3.  Abdominal aortogram             4.  Bilateral lower extremity runoff             5.  Atherectomy with drug-coated balloon angioplasty, left popliteal artery             6.  Atherectomy with balloon angioplasty, left tibioperoneal trunk             7.  Conscious sedation, with 115 minutes             8.  Closure device, Mynx x2   Indications: Patient has a history of a fenestrated endovascular aneurysm repair.  He has developed progressive claudication symptoms.  He is now having difficulty walking on the left leg.  He comes in today for further evaluation of possible intervention. Findings:              Aortogram: Endovascular aneurysm stent is visualized.  The renal artery stents are widely patent.  The superior mesenteric artery is widely patent.  No evidence of endoleak is identified.  Bilateral external iliac arteries are widely patent             Right Lower Extremity: The right common femoral artery is widely patent.  The profunda femoris vein throughout its course.  The superficial femoral artery is heavily calcified and occludes at the level of the patella with reconstitution of the tibioperoneal trunk.  Two-vessel runoff is seen via the posterior tibial and peroneal artery.             Left Lower Extremity: The left common femoral and profundofemoral artery  pain throughout their course.  There is calcification and stenosis within the distal common femoral and proximal superficial femoral artery.  The superficial femoral artery is calcified but patent throughout its course.  There is a short segment occlusion associated with a heavily calcified exophytic plaque at the level of the joint space.  There is reconstitution of the below-knee popliteal artery and three-vessel runoff.  Intervention: After the above images were acquired the decision made to proceed with intervention.  Antegrade access into the left common femoral artery was performed under ultrasound guidance.  A 6 French 45 cm sheath was then advanced into the superficial femoral artery.  The patient was fully heparinized.  I used a V 18 and a 018 catheter to cross the lesion.  The wire was directed into the posterior tibial artery.  A Viper wire was then inserted followed by a small nab 6 filter which was deployed in the distal below-knee popliteal artery.  I then used a 1.5 solid CSI atherectomy device and performed atherectomy of the popliteal artery at low medium and high speeds.  I then selected a 4 x 40 Medtronic drug-coated balloon to perform balloon angioplasty.  This was done at nominal pressure for 2 minutes.  Follow-up imaging revealed resolution of the stenosis.  It was at this point that the distal below-knee popliteal artery looked like it needed to be addressed because the stenosis was greater than 80%.  I removed the filter and then reengage the posterior tibial artery with a V 18 wire.  Using a catheter a Viper wire was placed.  I then used the 1.5 CSI device to perform atherectomy only at low speed of the distal popliteal and tibioperoneal trunk.  A 3 x 20 balloon was used to perform primary balloon angioplasty of this area.  This was done for 2 minutes.  Follow-up revealed inline flow with three-vessel runoff to the ankle.  The patient has persistent disease out onto his foot.  The left  groin was closed with a minx  Impression:             #1  Short segment occlusion of the left popliteal artery treated with atherectomy using a CSI 1.5 solid device and 4 x 40 drug-coated balloon.  There was no residual stenosis             #2  80% stenosis of the distal popliteal and proximal tibioperoneal trunk treated with CSI atherectomy using a 1.5 device and balloon angioplasty using a 3 x 20 balloon.             #  3  Right popliteal occlusion at the level of the patella with reconstitution of the tibioperoneal trunk.  Laboratory Data:   Chemistry Recent Labs  Lab 11/10/19 0829 12/04/2019 1230 12-04-19 1714  NA 140 137 133*  K 4.7 4.9 6.4*  CL 106 104  --   CO2  --  20*  --   GLUCOSE 100* 144*  --   BUN 35* 35*  --   CREATININE 1.30* 1.66*  --   CALCIUM  --  9.3  --   GFRNONAA  --  39*  --   GFRAA  --  45*  --   ANIONGAP  --  13  --     Lab Results  Component Value Date   ALT 30 2019-12-04   AST 42 (H) Dec 04, 2019   ALKPHOS 112 2019-12-04   BILITOT 1.3 (H) December 04, 2019   Hematology Recent Labs  Lab 11/10/19 0829 12/04/19 1230 2019/12/04 1714  WBC  --  9.8  --   RBC  --  2.91*  --   HGB 15.0 9.1* 10.2*  HCT 44.0 28.7* 30.0*  MCV  --  98.6  --   MCH  --  31.3  --   MCHC  --  31.7  --   RDW  --  17.0*  --   PLT  --  192  --    Cardiac Enzymes High Sensitivity Troponin:   Recent Labs  Lab December 04, 2019 1230 2019-12-04 1518  TROPONINIHS 301* 760*      BNP Recent Labs  Lab 2019-12-04 1230  BNP 1,981.8*    DDimer  Recent Labs  Lab 12-04-19 1230  DDIMER 3.83*   TSH:  Lab Results  Component Value Date   TSH 1.766 07/31/2013   Lipids: Lab Results  Component Value Date   CHOL 129 10/14/2019   HDL 42 10/14/2019   LDLCALC 69 10/14/2019   TRIG 90 10/14/2019   CHOLHDL 3.1 10/14/2019   HgbA1c: Lab Results  Component Value Date   HGBA1C 6.5 (H) 07/30/2013   Magnesium:  Magnesium  Date Value Ref Range Status  02/25/2019 1.8 1.7 - 2.4 mg/dL Final     Comment:    Performed at [facility], [place] [address] [place], [place] [address]     Radiology/Studies:  Ct Angio Chest Pe W And/or Wo Contrast  Result Date: 12/04/19 CLINICAL DATA:  Pt arrives POV for acute onset SOB that woke him from sleep last night at 0330. Pt reports that he had a stent placed on Tuesday, and noted acute onset SOB last night. Pt is tachypnic EXAM: CT ANGIOGRAPHY CHEST WITH CONTRAST TECHNIQUE: Multidetector CT imaging of the chest was performed using the standard protocol during bolus administration of intravenous contrast. Multiplanar CT image reconstructions and MIPs were obtained to evaluate the vascular anatomy. CONTRAST:  174mL OMNIPAQUE IOHEXOL 350 MG/ML SOLN COMPARISON:  10/14/2019 FINDINGS: Cardiovascular: There is dense opacification of the pulmonary arteries to the segmental level. There is no aortic opacifications. There is no convincing pulmonary embolus. Heart is mildly enlarged. There are changes from prior CABG surgery and coronary artery calcifications. No pericardial effusion. Ascending aorta dilated to 4.2 cm. Mediastinum/Nodes: Prominent shotty lymph nodes along the right paratracheal mediastinum, largest 12 mm in short axis. No mediastinal or hilar masses. Trachea is widely patent esophagus is mildly distended, lung a well-defined distally. Small moderate size hiatal hernia. Lungs/Pleura: Small right and trace left pleural effusions. On the right pleural fluid is loculated along the fissures with a  nodular appearance, which has increased prior exam. Lobulated type posterior pleural fluid is now noted extending to the right apex. There is interstitial thickening that is most evident in lower lungs, particularly the right lower lobe and, to lesser degree, right middle lobe with significant peribronchial thickening. This has also increased from prior exam. There are underlying changes of moderate to advanced centrilobular emphysema, stable. No pneumothorax.  Upper Abdomen: No acute findings.  Aortic vascular stent. Musculoskeletal: No fracture or acute finding. No osteoblastic or osteolytic lesions. Review of the MIP images confirms the above findings. IMPRESSION: 1. No evidence of a pulmonary embolism. 2. Loculated right pleural effusion has increased from the prior study as has right lower middle lobe interstitial thickening. Interstitial thickening is felt likely to reflect asymmetric interstitial pulmonary edema congestive heart failure. 3. Contrast extends from the superior vena cava into the azygos arch and to spinal venous branches, and distends the inferior vena cava and hepatic venous branches. These findings suggest right heart failure. 4. Dilated ascending thoracic aorta to 4.2 cm, unchanged from prior CT allowing for measurement differences. Ascending thoracic aortic aneurysm. Recommend semi-annual imaging followup by CTA or MRA and referral to cardiothoracic surgery if not already obtained. This recommendation follows 2010 ACCF/AHA/AATS/ACR/ASA/SCA/SCAI/SIR/STS/SVM Guidelines for the Diagnosis and Management of Patients With Thoracic Aortic Disease. Circulation. 2010; 121: D408-X448. Aortic aneurysm NOS (ICD10-I71.9) 5. Moderate to advanced centrilobular emphysema. 6. Coronary artery and aortic atherosclerotic calcifications. Aortic Atherosclerosis (ICD10-I70.0) and Emphysema (ICD10-J43.9). Electronically Signed   By: Lajean Manes M.D.   On: 11/26/19 14:58   Dg Chest Port 1 View  Result Date: 26-Nov-2019 CLINICAL DATA:  Shortness of breath.  Chest pain. EXAM: PORTABLE CHEST 1 VIEW COMPARISON:  10/15/2019.  CT 10/14/2019. FINDINGS: Prior CABG. Heart size stable. Left lower lobe atelectatic changes again noted. Fissural fluid on the right again noted without significant interim change. Stable small bilateral pleural effusions again noted. No pneumothorax. IMPRESSION: 1.  Left lower lobe atelectatic changes again noted. 2. Stable small bilateral pleural  effusions. Unchanged fissural fluid on the right noted. Electronically Signed   By: Marcello Moores  Register   On: Nov 26, 2019 13:15   [place] Ekg Site Rite  Result Date: November 26, 2019 If Site Rite image not attached, placement could not be confirmed due to current cardiac rhythm.   Assessment and Plan:   1.  Acute on chronic combined systolic and diastolic CHF: -Dr. Audie Box did a bedside echo which showed EF approximately 25%, lower than previous. -His IVC is dilated, so he is volume overloaded. See CT report above, movement of contrast was consistent with right heart failure although he does not have lower extremity edema. -However, his blood pressure limits therapies. -Once his map is greater than 60, give Lasix and monitor volume output carefully -Check a regular echo tomorrow  2.  Shock: -It is not completely clear if this is cardiogenic or possibly septic as well -He needs a central line and A-line. -Levophed will be used for pressure support -Get a coox once the lines are in place, that will help guide therapy. -Dobutamine may be needed in addition to the Levo.   Active Problems:   Respiratory failure, acute ([place])  For questions or updates, please contact [facility] Please consult [email or web address] for contact info under Cardiology/STEMI.   Jonetta Speak, PA-C  11-26-2019 6:16 PM '

## 2019-12-11 NOTE — Procedures (Signed)
Hemodialysis Catheter Insertion Procedure Note [place] [id number] 1941-03-19  Procedure: Insertion of Hemodialysis Catheter Indications: Vasopressors and possible need for CRRT  Procedure Details Consent: Risks of procedure as well as the alternatives and risks of each were explained to the (patient/caregiver).  Consent for procedure obtained. Time Out: Verified patient identification, verified procedure, site/side was marked, verified correct patient position, special equipment/implants available, medications/allergies/relevent history reviewed, required imaging and test results available.  Performed  Maximum sterile technique was used including antiseptics, cap, gloves, gown, hand hygiene, mask and sheet. Skin prep: Chlorhexidine; local anesthetic administered A antimicrobial bonded/coated triple lumen catheter was placed in the right femoral vein due to multiple attempts, no other available access using the Seldinger technique.  Evaluation Blood flow good Complications: No apparent complications Patient did tolerate procedure well. Chest X-ray ordered to verify placement.  CXR: not needed.  Procedure performed under direct ultrasound guidance for real time vessel cannulation.   ]  Right IJ extremely small, felt not appropriate for HD cath placement. Left IJ cannulated, but difficulty passing wire passed roughly 12 - 15cm.  Did place catheter; however, met resistance. Aborted and moved to right femoral site.   [name], [facility] Pgr: [phone]  or [phone] 11/18/19, 8:25 PM

## 2019-12-11 NOTE — Telephone Encounter (Signed)
Pt is not coughing and no wheezing.  Pt does have pain from the catheterization but other than that no other body aches. Pt is more fatigued than usual. Pt's wife Silva Bandy said that pt stays cold all the time so that is not out of the norm.  Silva Bandy said that pt has been throwing up all morning and stated that it began around 3:30am and he is still throwing up off and on.  I had Silva Bandy take pt's temp and she said that it was 98.0  When EMS was at the house, pt's O2 sats were 97%. Pt does not have a pulse ox at home to be able to check sats.  It was 5:00am when EMS was called and they were at the house roughly 33min before they left after evaluating pt.

## 2019-12-11 NOTE — ED Notes (Signed)
Pt given sip of water per his request.  Placed on bedpan.  Spo2 sensor is not measuring, pt is on 4L [place]

## 2019-12-11 NOTE — ED Notes (Signed)
Pt had episode of emesis; pt given zofran; per RT, will try bipap again in 45 minutes after ensuring pt has no more episodes of emesis; will continue to monitor

## 2019-12-11 NOTE — Progress Notes (Signed)
ABG results given to RN and MD. MD recommended Bi-PAP for WOB. Pt using accessory muscles with RR of 34. He is alert and able to answer questions appropriately. ABG ran due to WOB and unable to get an accurate pulseox on pt. PaO2 high on ABG.

## 2019-12-11 NOTE — Progress Notes (Addendum)
Pt yelled out through Bi-PAP mask that he felt like he was going to throw up. Mask removed and placed on standby. 5L [place] placed on pt. RN is given zofran. MD aware. RT will continue to monitor pt and leave Bi-PAP off until pt doesn't feel nauseous. ABG critical results given to MD and RN.

## 2019-12-11 NOTE — ED Provider Notes (Signed)
[facility] EMERGENCY DEPARTMENT Provider Note   CSN: [id number] Arrival date & time: 12/05/19  1212     History   Chief Complaint Chief Complaint  Patient presents with  . Shortness of Breath    HPI Glen Green is a 79 y.o. male.     HPI Patient with multiple medical problems presents from home with concern of dyspnea. Patient's medical issues include: AAA status post repair, CAD, CHF, malignancy, and notably vascular intervention on lower extremity vasculature 2 days ago. He notes that he was recovering well until today, when he suddenly became dyspneic, just prior to transport here. Since that time he has had worsening dyspnea, feels as though he cannot do anything without becoming more dyspneic. No new pain, including chest pain. Patient has abdominal pain at baseline, this is seemingly unchanged. No new loss of sensation in either extremity. There is some swelling in the left lower extremity. Patient notes that he has been taking his medication since discharge 2 days ago, and in general. He acknowledges a history of AAA, repair about 1 year ago, and claudication intervention, as above 2 days ago.   Past Medical History:  Diagnosis Date  . AAA (abdominal aortic aneurysm) ([place]) 08/22/2012   3.9 cm by CT  - June 2013, stable   . 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 Atrial fibrillation ([place])   . CAD (coronary artery disease)   . Cancer ([place])    Esophageal, adrenal gland, skin; lung  . Carotid artery occlusion   . Cerebral aneurysm    [place]. repair  1994  . Chronic combined systolic (congestive) and diastolic (congestive) heart failure ([place])   . Dyslipidemia   . History of esophageal cancer    s/p transhiatal esophagogastrectomy  . History of lung cancer   . Hypothyroidism   . Pneumonia   . Postoperative atrial fibrillation ([place]) 12/04/2013   Short course of amiodarone, resolved. Postop bypass  . SBO (small bowel obstruction)  ([place]) 08/15/2012   History of small bowel obstruction (status post small bowel       resection, lysis of adhesions, incidental appendectomy, and repair       of left diaphragmatic hernia  . Stroke [facility]) 1995   denies residual    Patient Active Problem List   Diagnosis Date Noted  . Severe mitral regurgitation   . Acute on chronic combined systolic and diastolic CHF (congestive heart failure) ([place]) 10/13/2019  . Acute renal failure superimposed on stage 3a chronic kidney disease ([place])   . Status post aortic coarctation stent placement 02/25/2019  . S/P AAA repair 02/25/2019  . 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[place]) 04/10/2018  . Pleural effusion on right 04/02/2018  . Pulmonary emphysema ([place])   . Acute respiratory failure with hypoxia ([place]) 03/08/2018  . Acute on chronic clinical systolic heart failure ([place]) 09/13/2014  . Aneurysm of abdominal vessel ([place]) 08/23/2014  . Cardiomyopathy, ischemic 06/16/2014  . Essential hypertension, benign 06/16/2014  . Pure hypercholesterolemia 06/16/2014  . Postoperative atrial fibrillation ([place]) 12/04/2013  . Coronary atherosclerosis of native coronary artery 09/09/2013  . NSTEMI (non-ST elevated myocardial infarction) ([place]) 07/30/2013  . AAA (abdominal aortic aneurysm) ([place]) 08/22/2012  . Bilateral hearing loss 08/22/2012  . PVD (peripheral vascular disease) ([place]) 08/22/2012  . Preventative health care 08/15/2012  . 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 SBO (small bowel obstruction) ([place]) 08/15/2012  . Hypokalemia 12/09/2011  . Weakness generalized 12/05/2011  . Protein-calorie malnutrition, moderate ([place]) 12/05/2011  .  Hyponatremia 12/05/2011  . Dyslipidemia   . History of esophageal cancer   . Hypothyroidism   . Cerebral aneurysm   . History of lung cancer     Past Surgical History:  Procedure Laterality Date  . ABDOMINAL AORTIC ENDOVASCULAR FENESTRATED STENT GRAFT N/A 02/25/2019   Procedure: ABDOMINAL AORTIC ENDOVASCULAR FENESTRATED  STENT GRAFT;  Surgeon: Serafina Mitchell, MD;  Location: [facility];  Service: Vascular;  Laterality: N/A;  . ABDOMINAL AORTOGRAM W/LOWER EXTREMITY Bilateral 11/10/2019   Procedure: ABDOMINAL AORTOGRAM W/LOWER EXTREMITY;  Surgeon: Serafina Mitchell, MD;  Location: [facility] LAB;  Service: Cardiovascular;  Laterality: Bilateral;  . BRAIN SURGERY     brain aneursyn  . CATARACT EXTRACTION W/ INTRAOCULAR LENS  IMPLANT, BILATERAL    . CHOLECYSTECTOMY    . CORONARY ARTERY BYPASS GRAFT N/A 07/30/2013   Procedure: CORONARY ARTERY BYPASS GRAFTING (CABG) times two on pump using left internal mammary artery and left greater saphenous vein via endovein harvest.;  Surgeon: Gaye Pollack, MD;  Location: [facility] OR;  Service: Open Heart Surgery;  Laterality: N/A;  . Exploratory laparotomy, lysis of adhesions, reduce of incarcerated small bowel and colon from the chest, limited small bowel resection, incidental appendectomy, and then repair of diaphragmatic hernia with AlloDerm mesh  10/27/2009   [place]  . Exploratory laparotomy,exploratory thoracotomy for hemorrhage  02/11/2003   [place]  . LEFT HEART CATHETERIZATION WITH CORONARY ANGIOGRAM N/A 07/30/2013   Procedure: LEFT HEART CATHETERIZATION WITH CORONARY ANGIOGRAM;  Surgeon: Peter M Martinique, MD;  Location: [facility] CATH LAB;  Service: Cardiovascular;  Laterality: N/A;  . Left subclavian Port- A-Cath insertion  03/09/2004   [place] Done  . left upper lobectomy with node dissection  02/24/2010   [place]  . PERIPHERAL VASCULAR ATHERECTOMY Left 11/10/2019   Procedure: PERIPHERAL VASCULAR ATHERECTOMY;  Surgeon: Serafina Mitchell, MD;  Location: [facility];  Service: Cardiovascular;  Laterality: Left;  SFA and TP TRUNK  . PERIPHERAL VASCULAR BALLOON ANGIOPLASTY Left 11/10/2019   Procedure: PERIPHERAL VASCULAR BALLOON ANGIOPLASTY;  Surgeon: Serafina Mitchell, MD;  Location: [facility] LAB;  Service: Cardiovascular;  Laterality: Left;  SFA and TP TRUNK  . TONSILLECTOMY    .  Transhiatal esophagectomy with cholecystectomy, jejunostomy,  pyloroplasty and removal of right subclavian Port-A- Cath     [place]        Home Medications    Prior to Admission medications   Medication Sig Start Date End Date Taking? Authorizing Provider  acetaminophen (TYLENOL) 500 MG tablet Take 1,000 mg by mouth every 6 (six) hours as needed for mild pain or headache.    [provider]  aspirin 81 MG tablet Take 1 tablet (81 mg total) by mouth daily. 12/04/13   Jerline Pain, MD  atorvastatin (LIPITOR) 40 MG tablet Take 40 mg by mouth daily.    [provider]  carvedilol (COREG) 6.25 MG tablet Take 1 tablet (6.25 mg total) by mouth 2 (two) times daily. 10/22/19   Kathyrn Drown D, NP  clopidogrel (PLAVIX) 75 MG tablet Take 75 mg by mouth daily.    [provider]  furosemide (LASIX) 20 MG tablet Take 1 tablet (20 mg total) by mouth daily. 10/22/19 01/20/20  Kathyrn Drown D, NP  guaiFENesin (MUCINEX) 600 MG 12 hr tablet Take 600 mg by mouth 2 (two) times daily as needed for to loosen phlegm.     [provider]  Tiotropium Bromide-Olodaterol (STIOLTO RESPIMAT) 2.5-2.5 MCG/ACT AERS Inhale 2 puffs into the lungs  daily.    [provider]    Family History Family History  Problem Relation Age of Onset  . Aneurysm Mother   . Kidney disease Father   . Heart disease Maternal Uncle   . Heart disease Paternal Uncle   . Aneurysm Maternal Grandmother   . Diabetes Sister     Social History Social History   Tobacco Use  . Smoking status: Former Smoker    Types: Cigarettes    Quit date: 12/10/2002    Years since quitting: 16.9  . Smokeless tobacco: Never Used  Substance Use Topics  . Alcohol use: No  . Drug use: No     Allergies   Quinolones   Review of Systems Review of Systems  Constitutional:       Per HPI, otherwise negative  HENT:       Per HPI, otherwise negative  Respiratory:       Per HPI, otherwise negative   Cardiovascular:       Per HPI, otherwise negative  Gastrointestinal: Negative for vomiting.  Endocrine:       Negative aside from HPI  Genitourinary:       Neg aside from HPI   Musculoskeletal:       Per HPI, otherwise negative  Skin: Negative.   Neurological: Positive for weakness. Negative for syncope.     Physical Exam Updated Vital Signs BP (!) 85/68   Pulse 87   Resp (!) 40   Ht 5\' 10"  (1.778 m)   Wt 60 kg   SpO2 (!) 88%   BMI 18.98 kg/m   Physical Exam Vitals signs and nursing note reviewed.  Constitutional:      General: He is in acute distress.     Appearance: He is ill-appearing and diaphoretic.     Comments: Sickly appearing adult male awake and alert with 6 L nasal cannula in place.  HENT:     Head: Normocephalic and atraumatic.  Eyes:     Conjunctiva/sclera: Conjunctivae normal.  Cardiovascular:     Rate and Rhythm: Regular rhythm. Tachycardia present.  Pulmonary:     Effort: Tachypnea, accessory muscle usage and respiratory distress present.     Breath sounds: No stridor. Decreased breath sounds present.  Chest:     Chest wall: No mass.  Abdominal:     General: There is no distension.     Palpations: There is no mass.     Tenderness: There is abdominal tenderness. There is guarding.     Comments: Midline scar thoracic and abdominal  Skin:    General: Skin is warm.  Neurological:     Mental Status: He is alert and oriented to person, place, and time.      ED Treatments / Results  Labs (all labs ordered are listed, but only abnormal results are displayed) Labs Reviewed  COMPREHENSIVE METABOLIC PANEL - Abnormal; Notable for the following components:      Result Value   CO2 20 (*)    Glucose, Bld 144 (*)    BUN 35 (*)    Creatinine, Ser 1.66 (*)    Albumin 3.3 (*)    AST 42 (*)    Total Bilirubin 1.3 (*)    GFR calc non Af Amer 39 (*)    GFR calc Af Amer 45 (*)    All other components within normal limits  CBC WITH DIFFERENTIAL/PLATELET  - Abnormal; Notable for the following components:   RBC 2.91 (*)    Hemoglobin 9.1 (*)  HCT 28.7 (*)    RDW 17.0 (*)    Monocytes Absolute 1.3 (*)    All other components within normal limits  BRAIN NATRIURETIC PEPTIDE - Abnormal; Notable for the following components:   B Natriuretic Peptide 1,981.8 (*)    All other components within normal limits  D-DIMER, QUANTITATIVE (NOT AT [facility]) - Abnormal; Notable for the following components:   D-Dimer, Quant 3.83 (*)    All other components within normal limits  TROPONIN I (HIGH SENSITIVITY) - Abnormal; Notable for the following components:   Troponin I (High Sensitivity) 301 (*)    All other components within normal limits  URINALYSIS, ROUTINE W REFLEX MICROSCOPIC  POC SARS CORONAVIRUS 2 AG -  ED  TROPONIN I (HIGH SENSITIVITY)    EKG EKG Interpretation  Date/Time:  11/25/19 12:24:46 EST Ventricular Rate:  91 PR Interval:    QRS Duration: 136 QT Interval:  384 QTC Calculation: 472 R Axis:   141 Text Interpretation: Wide QRS rhythm Right axis deviation Non-specific intra-ventricular conduction block T wave abnormality No significant change since last tracing Abnormal ECG Confirmed by Carmin Muskrat [phone]) on 25-Nov-2019 12:38:13 PM   Radiology Ct Angio Chest Pe W And/or Wo Contrast  Result Date: 11-25-2019 CLINICAL DATA:  Pt arrives POV for acute onset SOB that woke him from sleep last night at 0330. Pt reports that he had a stent placed on Tuesday, and noted acute onset SOB last night. Pt is tachypnic EXAM: CT ANGIOGRAPHY CHEST WITH CONTRAST TECHNIQUE: Multidetector CT imaging of the chest was performed using the standard protocol during bolus administration of intravenous contrast. Multiplanar CT image reconstructions and MIPs were obtained to evaluate the vascular anatomy. CONTRAST:  139mL OMNIPAQUE IOHEXOL 350 MG/ML SOLN COMPARISON:  10/14/2019 FINDINGS: Cardiovascular: There is dense opacification of the pulmonary  arteries to the segmental level. There is no aortic opacifications. There is no convincing pulmonary embolus. Heart is mildly enlarged. There are changes from prior CABG surgery and coronary artery calcifications. No pericardial effusion. Ascending aorta dilated to 4.2 cm. Mediastinum/Nodes: Prominent shotty lymph nodes along the right paratracheal mediastinum, largest 12 mm in short axis. No mediastinal or hilar masses. Trachea is widely patent esophagus is mildly distended, lung a well-defined distally. Small moderate size hiatal hernia. Lungs/Pleura: Small right and trace left pleural effusions. On the right pleural fluid is loculated along the fissures with a nodular appearance, which has increased prior exam. Lobulated type posterior pleural fluid is now noted extending to the right apex. There is interstitial thickening that is most evident in lower lungs, particularly the right lower lobe and, to lesser degree, right middle lobe with significant peribronchial thickening. This has also increased from prior exam. There are underlying changes of moderate to advanced centrilobular emphysema, stable. No pneumothorax. Upper Abdomen: No acute findings.  Aortic vascular stent. Musculoskeletal: No fracture or acute finding. No osteoblastic or osteolytic lesions. Review of the MIP images confirms the above findings. IMPRESSION: 1. No evidence of a pulmonary embolism. 2. Loculated right pleural effusion has increased from the prior study as has right lower middle lobe interstitial thickening. Interstitial thickening is felt likely to reflect asymmetric interstitial pulmonary edema congestive heart failure. 3. Contrast extends from the superior vena cava into the azygos arch and to spinal venous branches, and distends the inferior vena cava and hepatic venous branches. These findings suggest right heart failure. 4. Dilated ascending thoracic aorta to 4.2 cm, unchanged from prior CT allowing for measurement differences.  Ascending thoracic  aortic aneurysm. Recommend semi-annual imaging followup by CTA or MRA and referral to cardiothoracic surgery if not already obtained. This recommendation follows 2010 ACCF/AHA/AATS/ACR/ASA/SCA/SCAI/SIR/STS/SVM Guidelines for the Diagnosis and Management of Patients With Thoracic Aortic Disease. Circulation. 2010; 121: Y694-W546. Aortic aneurysm NOS (ICD10-I71.9) 5. Moderate to advanced centrilobular emphysema. 6. Coronary artery and aortic atherosclerotic calcifications. Aortic Atherosclerosis (ICD10-I70.0) and Emphysema (ICD10-J43.9). Electronically Signed   By: Lajean Manes M.D.   On: 2019-11-15 14:58   Dg Chest Port 1 View  Result Date: 11-15-2019 CLINICAL DATA:  Shortness of breath.  Chest pain. EXAM: PORTABLE CHEST 1 VIEW COMPARISON:  10/15/2019.  CT 10/14/2019. FINDINGS: Prior CABG. Heart size stable. Left lower lobe atelectatic changes again noted. Fissural fluid on the right again noted without significant interim change. Stable small bilateral pleural effusions again noted. No pneumothorax. IMPRESSION: 1.  Left lower lobe atelectatic changes again noted. 2. Stable small bilateral pleural effusions. Unchanged fissural fluid on the right noted. Electronically Signed   By: Marcello Moores  Register   On: 11-15-19 13:15    Procedures Procedures (including critical care time)  CRITICAL CARE Performed by: Carmin Muskrat Total critical care time: 40 minutes Critical care time was exclusive of separately billable procedures and treating other patients. Critical care was necessary to treat or prevent imminent or life-threatening deterioration. Critical care was time spent personally by me on the following activities: development of treatment plan with patient and/or surrogate as well as nursing, discussions with consultants, evaluation of patient's response to treatment, examination of patient, obtaining history from patient or surrogate, ordering and performing treatments and  interventions, ordering and review of laboratory studies, ordering and review of radiographic studies, pulse oximetry and re-evaluation of patient's condition.  Medications Ordered in ED Medications  iohexol (OMNIPAQUE) 350 MG/ML injection 100 mL (100 mLs Intravenous Contrast Given November 15, 2019 1408)  furosemide (LASIX) injection 60 mg (60 mg Intravenous Given Nov 15, 2019 1515)     Initial Impression / Assessment and Plan / ED Course  I have reviewed the triage vital signs and the nursing notes.  Pertinent labs & imaging results that were available during my care of the patient were reviewed by me and considered in my medical decision making (see chart for details).    Chart review after initial evaluation notable for operative intervention 2 days ago, and echocardiogram last month during hospitalization for dyspnea, results as below:   Echo 10/13/19 IMPRESSIONS    1. Left ventricular ejection fraction, by visual estimation, is 40 to 45%. The left ventricle has mild to moderately decreased function. There is no left ventricular hypertrophy.  2. Left ventricular diastolic function could not be evaluated.  3. Global right ventricle has normal systolic function.The right ventricular size is normal.  4. Left atrial size was mildly dilated.  5. Right atrial size was normal.  6. Mild mitral annular calcification.  7. The mitral valve is normal in structure. Severe mitral valve regurgitation. No evidence of mitral stenosis.  8. The tricuspid valve is normal in structure. Tricuspid valve regurgitation is severe.  9. The aortic valve is tricuspid. Aortic valve regurgitation is not visualized. Mild to moderate aortic valve sclerosis/calcification without any evidence of aortic stenosis. 10. The pulmonic valve was normal in structure. Pulmonic valve regurgitation is mild. 11. Moderately elevated pulmonary artery systolic pressure. 12. The inferior vena cava is dilated in size with >50% respiratory  variability, suggesting right atrial pressure of 8 mmHg. 13. Mild to moderate global reduction in LV systolic function; sclerotic aortic valve; severe, posteriorly directed  MR; mild LAE; severe TR; moderate pulmonary hypertension.   Impression (from vascular 12/1):             #1  Short segment occlusion of the left popliteal artery treated with atherectomy using a CSI 1.5 solid device and 4 x 40 drug-coated balloon.  There was no residual stenosis             #2  80% stenosis of the distal popliteal and proximal tibioperoneal trunk treated with CSI atherectomy using a 1.5 device and balloon angioplasty using a 3 x 20 balloon.             #3  Right popliteal occlusion at the level of the patella with reconstitution of the tibioperoneal trunk.      After initial evaluation with supplemental oxygen the patient had improvement in his condition.  Update: On return from CT angiography, the patient has increased dyspnea, requiring initiation of nonrebreather mask.  With this he has saturation 90%.   Update: I discussed patient's case with his vascular surgery team. We discussed possibilities including blood loss, versus fluid overload during procedure. Patient has no hematoma here, no report of substantial bleeding at home, and there is no reported likelihood of substantial blood loss in the OR.  Patient now accompanied by his wife.  3:34 PM Discussed patient's case with critical care.  This elderly male presents 2 days after vascular surgery procedure for claudication, now with concern for dyspnea. Here the patient is awake and alert, afebrile, but has notable increased work of breathing, new oxygen requirement, respiratory distress, requiring nonrebreather mask Complicating this is the patient's hypotension.  Other suspicion for fluid overload status given his elevated BNP history of heart failure and recent procedure. Patient received IV Lasix, but given his hypotension, respiratory  distress, will require admission, likely to critical care for further monitoring, management, diuresis per Covid test pending on admission.  Final Clinical Impressions(s) / ED Diagnoses   Final diagnoses:  Respiratory distress     Carmin Muskrat, MD 2019/11/21 1536

## 2019-12-11 NOTE — Anesthesia Procedure Notes (Signed)
Procedure Name: Intubation Date/Time: 03-Dec-2019 8:47 AM Performed by: Oletta Lamas, CRNA Pre-anesthesia Checklist: Patient identified, Emergency Drugs available, Suction available and Patient being monitored Patient Re-evaluated:Patient Re-evaluated prior to induction Oxygen Delivery Method: Circle System Utilized Preoxygenation: Pre-oxygenation with 100% oxygen Induction Type: IV induction Laryngoscope Size: Glidescope and 4 Tube type: Oral Tube size: 8.0 mm Number of attempts: 1 Airway Equipment and Method: Stylet Placement Confirmation: ETT inserted through vocal cords under direct vision,  positive ETCO2 and breath sounds checked- equal and bilateral Secured at: 23 cm Tube secured with: Tape Dental Injury: Teeth and Oropharynx as per pre-operative assessment

## 2019-12-11 NOTE — ED Notes (Signed)
Pt placed on NRB at 15L as he continued to be sob and tachypneic and anxious and spo2 sensor was not reading. EDP notified

## 2019-12-11 NOTE — ED Notes (Signed)
cardiology at bedside

## 2019-12-11 NOTE — ED Notes (Signed)
Patient transported to CT 

## 2019-12-11 NOTE — H&P (Signed)
NAME:  Glen Green, MRN:  [id number], DOB:  10-28-1941, LOS: 0 ADMISSION DATE:  December 01, 2019, CONSULTATION DATE:  12/01/2019 REFERRING MD:  Dr Vanita Panda, CHIEF COMPLAINT:  sob  Brief History   79 yo male with sudden onset dyspnea at home 2 days after atherectomy of L pop and L tibioperoneal trunk. Found to be hypoxic, grossly volume overloaded and hypotension in likely RHF.    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   Pt states he awoke this am with sudden sob around 330 that has gotten worse to point he is sob at rest. Denies any recent pnd or worsening orthopnea. He states he does see cardiology as outpt, I cannot find oupt notes in recent history within our system. He endorses he has recently been decreased on lasix dosing at home due to "frailty" and that he has had 10# wt loss over past month. He denies any cp/dizziness/light-headed. Denies cough/sputum production/recent sick contacts.   He also c/o n/v today.   He called his pulmonary dr's office who advised him to go to ED.   When pt presented he was found to be progressively hypoxic req NRB to maintain saturations. CTPA ordered and negative for acute PE. He was found to be volume overloaded with pulmonary edema and suspected decompensated HF. He was given 60mg  of lasix and no significant output of yet. He was also maintaining marginal pressures.   Of note, pt has had multiple hospitalziations this year upon chart review, most recently 2/2 decompensated hfPt has recent vascular intervention in LLE with atherectomy of L pop a and L tibioperoneal trunk and was recovering at home without issue until this am.   Past Medical History   Past Medical History:  Diagnosis Date  . AAA (abdominal aortic  aneurysm) ([place]) 08/22/2012   3.9 cm by CT  - June 2013, stable   . 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 Atrial fibrillation ([place])   . CAD (coronary artery disease)   . Cancer ([place])    Esophageal, adrenal gland, skin; lung  . Carotid artery occlusion   . Cerebral aneurysm    [place]. repair  1994  . Chronic combined systolic (congestive) and diastolic (congestive) heart failure ([place])   . Dyslipidemia   . History of esophageal cancer    s/p transhiatal esophagogastrectomy  . History of lung cancer   . Hypothyroidism   . Pneumonia   . Postoperative atrial fibrillation ([place]) 12/04/2013   Short course of amiodarone, resolved. Postop bypass  . SBO (small bowel obstruction) ([place]) 08/15/2012   History of small bowel obstruction (status post small bowel       resection, lysis of adhesions, incidental appendectomy, and repair       of left diaphragmatic hernia  . Stroke [facility]) 1995   denies residual    Significant Hospital Events     Consults:  12/3: HF  Procedures:    Significant Diagnostic Tests:  12/3 CTPA: 1. No evidence of a pulmonary embolism. 2. Loculated right pleural effusion has increased from the prior study as has right lower middle lobe interstitial thickening. Interstitial thickening is felt likely to reflect asymmetric interstitial pulmonary edema congestive heart failure. 3. Contrast extends from  the superior vena cava into the azygos arch and to spinal venous branches, and distends the inferior vena cava and hepatic venous branches. These findings suggest right heart failure. 4. Dilated ascending thoracic aorta to 4.2 cm, unchanged from prior CT allowing for measurement differences. Ascending thoracic aortic aneurysm. Recommend semi-annual imaging followup by CTA or MRA and referral to cardiothoracic surgery if not already obtained. This recommendation follows 2010 ACCF/AHA/AATS/ACR/ASA/SCA/SCAI/SIR/STS/SVM Guidelines for the Diagnosis and  Management of Patients With Thoracic Aortic Disease. Circulation. 2010; 121: F026-V785. Aortic aneurysm NOS (ICD10-I71.9) 5. Moderate to advanced centrilobular emphysema. 6. Coronary artery and aortic atherosclerotic calcifications.  Micro Data:  12/3 sars2: neg  Antimicrobials:    Interim history/subjective:  admitted  Objective   Blood pressure (!) 85/68, pulse 87, resp. rate (!) 40, height 5\' 10"  (1.778 m), weight 60 kg, SpO2 (!) 88 %.       No intake or output data in the 24 hours ending Nov 30, 2019 1607 Filed Weights   11/30/2019 1227  Weight: 60 kg    Examination: General: chronically and acutely ill appearing male with cachectic appearance HEENT: NCAT, EOMI, PERRLA, MMdry but pink Lungs: CTA, no wheezes, rhonchi, + diminished BS in LUL , diminished in R base as well, tachypneic on appearance worse with speech.  Cardiovascular: RRR, Abdomen: thin, soft, NT,ND, BS+ Extremities: + small edema in ble Skin: no rashes, cool extremities and dry Neuro: moves all 4 extremities GU: condom cath in place  Resolved Hospital Problem list     Assessment & Plan:  Acute hypoxic resp failure:  -on NRB at this time in ed -2/2 pulm edema from acute decompensated HF  -Sars2 negative -neg CTPA -high risk for need for intubation, will need n/v under control prior to NIV use pulm HTN:  -per echo 10/13/2019 Elevated ddimer:  -negative CTPA however COPD:  -without acute excerbation Pleural effusion:  -recent tap c/w transudative and neg cytology  Acute decompensated HFrEF:  -LVEF 40-45% -diurese -BNP 1981 -would benefit from HF consult/cardiology called and Dr. Marisue Ivan will come assess -repeat echo pedning (was supposed to have one in a month or so anyway per d/c summary from 10/2019 to f/u valves) Elevated trop:  -noted at 301 -trend -repeat ekg obtained at bedside with some ST depression in I, v4-V6, no st elevation. Appears similar to one in 10/2019 Hypotension:  -monitor  -may req low dose NE Severe MR/TR:  -follow up echo   Acute n/v:  -zofran prn -npo  Chronic normocytic anemia:  -around baseline hgb of 9  CKD3:  -baseline Cr and function -follow uop and indices with diuresis   UPDATE 1800:  Hyperkalemia:  -K came back from istat elevated at >6 but pt bmp earlier today with stable renal function from baseline.  -will hold acei at this time.  -stat recheck pending, will give dose of calcium at this time but hold on treatment with no reason to think this is accurate lab to have increased so dramatically.  -following closely and already given lasix   Best practice:  Diet: npo Pain/Anxiety/Delirium protocol (if indicated): none VAP protocol (if indicated): none DVT prophylaxis: heparin GI prophylaxis: none indicated Glucose control: monitor Mobility: bedrest Code Status: FULL, d/w pt Family Communication: updated  Disposition: ICU  Labs   CBC: Recent Labs  Lab 11/10/19 0829 2019/11/30 1230  WBC  --  9.8  NEUTROABS  --  6.4  HGB 15.0 9.1*  HCT 44.0 28.7*  MCV  --  98.6  PLT  --  387    Basic Metabolic Panel: Recent Labs  Lab 11/10/19 0829 11/20/19 1230  NA 140 137  K 4.7 4.9  CL 106 104  CO2  --  20*  GLUCOSE 100* 144*  BUN 35* 35*  CREATININE 1.30* 1.66*  CALCIUM  --  9.3   GFR: Estimated Creatinine Clearance: 31.6 mL/min (A) (by C-G formula based on SCr of 1.66 mg/dL (H)). Recent Labs  Lab November 20, 2019 1230  WBC 9.8    Liver Function Tests: Recent Labs  Lab Nov 20, 2019 1230  AST 42*  ALT 30  ALKPHOS 112  BILITOT 1.3*  PROT 6.6  ALBUMIN 3.3*   No results for input(s): LIPASE, AMYLASE in the last 168 hours. No results for input(s): AMMONIA in the last 168 hours.  ABG    Component Value Date/Time   PHART 7.307 (L) 02/25/2019 1524   PCO2ART 41.1 02/25/2019 1524   PO2ART 305.0 (H) 02/25/2019 1524   HCO3 20.6 02/25/2019 1524   TCO2 24 11/10/2019 0829   ACIDBASEDEF 5.0 (H) 02/25/2019 1524   O2SAT 100.0  02/25/2019 1524     Coagulation Profile: No results for input(s): INR, PROTIME in the last 168 hours.  Cardiac Enzymes: No results for input(s): CKTOTAL, CKMB, CKMBINDEX, TROPONINI in the last 168 hours.  HbA1C: Hgb A1c MFr Bld  Date/Time Value Ref Range Status  07/30/2013 05:40 PM 6.5 (H) <5.7 % Final    Comment:    (NOTE)                                                                       According to the ADA Clinical Practice Recommendations for 2011, when HbA1c is used as a screening test:  >=6.5%   Diagnostic of Diabetes Mellitus           (if abnormal result is confirmed) 5.7-6.4%   Increased risk of developing Diabetes Mellitus References:Diagnosis and Classification of Diabetes Mellitus,Diabetes FIEP,3295,18(ACZYS 1):S62-S69 and Standards of Medical Care in         Diabetes - 2011,Diabetes AYTK,1601,09 (Suppl 1):S11-S61.  06/08/2009 06:05 PM (H) 4.6 - 6.1 % Final   6.2 (NOTE) The ADA recommends the following therapeutic goal for glycemic control related to Hgb A1c measurement: Goal of therapy: <6.5 Hgb A1c  Reference: American Diabetes Association: Clinical Practice Recommendations 2010, Diabetes Care, 2010, 33: (Suppl  1).    CBG: No results for input(s): GLUCAP in the last 168 hours.  Review of Systems:   Acute onset sob this am. Progressive thru day. +n/V no diarrhea. No cp. Dizziness. Rest per HPI  Past Medical History  He,  has a past medical history of AAA (abdominal aortic aneurysm) ([place]) (08/22/2012), Adrenal tumor (08/15/2012), Atrial fibrillation ([place]), CAD (coronary artery disease), Cancer ([place]), Carotid artery occlusion, Cerebral aneurysm, Chronic combined systolic (congestive) and diastolic (congestive) heart failure ([place]), Dyslipidemia, History of esophageal cancer, History of lung cancer, Hypothyroidism, Pneumonia, Postoperative atrial fibrillation ([place]) (12/04/2013), SBO (small bowel obstruction) ([place]) (08/15/2012), and Stroke ([place]) (1995).   Surgical  History    Past Surgical History:  Procedure Laterality Date  . ABDOMINAL AORTIC ENDOVASCULAR FENESTRATED STENT GRAFT N/A 02/25/2019   Procedure: ABDOMINAL AORTIC ENDOVASCULAR FENESTRATED STENT GRAFT;  Surgeon: Serafina Mitchell, MD;  Location: [facility];  Service: Vascular;  Laterality: N/A;  . ABDOMINAL AORTOGRAM W/LOWER EXTREMITY Bilateral 11/10/2019   Procedure: ABDOMINAL AORTOGRAM W/LOWER EXTREMITY;  Surgeon: Serafina Mitchell, MD;  Location: [facility];  Service: Cardiovascular;  Laterality: Bilateral;  . BRAIN SURGERY     brain aneursyn  . CATARACT EXTRACTION W/ INTRAOCULAR LENS  IMPLANT, BILATERAL    . CHOLECYSTECTOMY    . CORONARY ARTERY BYPASS GRAFT N/A 07/30/2013   Procedure: CORONARY ARTERY BYPASS GRAFTING (CABG) times two on pump using left internal mammary artery and left greater saphenous vein via endovein harvest.;  Surgeon: Gaye Pollack, MD;  Location: [facility] OR;  Service: Open Heart Surgery;  Laterality: N/A;  . Exploratory laparotomy, lysis of adhesions, reduce of incarcerated small bowel and colon from the chest, limited small bowel resection, incidental appendectomy, and then repair of diaphragmatic hernia with AlloDerm mesh  10/27/2009   [place]  . Exploratory laparotomy,exploratory thoracotomy for hemorrhage  02/11/2003   [place]  . LEFT HEART CATHETERIZATION WITH CORONARY ANGIOGRAM N/A 07/30/2013   Procedure: LEFT HEART CATHETERIZATION WITH CORONARY ANGIOGRAM;  Surgeon: Peter M Martinique, MD;  Location: [facility];  Service: Cardiovascular;  Laterality: N/A;  . Left subclavian Port- A-Cath insertion  03/09/2004   [place] Done  . left upper lobectomy with node dissection  02/24/2010   [place]  . PERIPHERAL VASCULAR ATHERECTOMY Left 11/10/2019   Procedure: PERIPHERAL VASCULAR ATHERECTOMY;  Surgeon: Serafina Mitchell, MD;  Location: [facility];  Service: Cardiovascular;  Laterality: Left;  SFA and TP TRUNK  . PERIPHERAL VASCULAR BALLOON ANGIOPLASTY Left 11/10/2019   Procedure:  PERIPHERAL VASCULAR BALLOON ANGIOPLASTY;  Surgeon: Serafina Mitchell, MD;  Location: [place] [facility];  Service: Cardiovascular;  Laterality: Left;  SFA and TP TRUNK  . TONSILLECTOMY    . Transhiatal esophagectomy with cholecystectomy, jejunostomy,  pyloroplasty and removal of right subclavian Port-A- Cath     [place]     Social History   reports that he quit smoking about 16 years ago. His smoking use included cigarettes. He has never used smokeless tobacco. He reports that he does not drink alcohol or use drugs.   Family History   His family history includes Aneurysm in his maternal grandmother and mother; Diabetes in his sister; Heart disease in his maternal uncle and paternal uncle; Kidney disease in his father.   Allergies Allergies  Allergen Reactions  . Quinolones Other (See Comments)    Patient was warned about not using Cipro and similar antibiotics. Recent studies have raised concern that fluoroquinolone antibiotics could be associated with an increased risk of aortic aneurysm Fluoroquinolones have non-antimicrobial properties that might jeopardise the integrity of the extracellular matrix of the vascular wall In a  propensity score matched cohort study in [place], there was a 66% increased rate of aortic aneurysm or dissection associated with oral fluoroquinolone use, compared wit     Home Medications  Prior to Admission medications   Medication Sig Start Date End Date Taking? Authorizing Provider  acetaminophen (TYLENOL) 500 MG tablet Take 1,000 mg by mouth every 6 (six) hours as needed for mild pain or headache.    [provider]  aspirin 81 MG tablet Take 1 tablet (81 mg total) by mouth daily. 12/04/13   Jerline Pain, MD  atorvastatin (LIPITOR) 40 MG tablet Take 40 mg by mouth daily.    [provider]  carvedilol (COREG) 6.25 MG tablet Take 1 tablet (6.25 mg total) by mouth 2 (two) times daily. 10/22/19  Kathyrn Drown D, NP  clopidogrel (PLAVIX) 75 MG  tablet Take 75 mg by mouth daily.    [provider]  furosemide (LASIX) 20 MG tablet Take 1 tablet (20 mg total) by mouth daily. 10/22/19 01/20/20  Kathyrn Drown D, NP  guaiFENesin (MUCINEX) 600 MG 12 hr tablet Take 600 mg by mouth 2 (two) times daily as needed for to loosen phlegm.     [provider]  Tiotropium Bromide-Olodaterol (STIOLTO RESPIMAT) 2.5-2.5 MCG/ACT AERS Inhale 2 puffs into the lungs daily.    [provider]     Critical care time: The patient is critically ill with multiple organ systems failure and requires high complexity decision making for assessment and support, frequent evaluation and titration of therapies, application of advanced monitoring technologies and extensive interpretation of multiple databases.  Critical care time 47 mins. This represents my time independent of the NP's/PA's/med students/residents time taking care of the pt. This is excluding procedures.     [facility] 11/28/19, 4:07 PM

## 2019-12-11 NOTE — Progress Notes (Signed)
PT expired after being made DNR vent was turned off.

## 2019-12-11 NOTE — Telephone Encounter (Signed)
Called and spoke with pt's wife Glen Green stating to her that pt needs to be evaluated either at an urgent care or ER due to worsening acute symptoms and she verbalized understanding. Told her for pt to keep scheduled appt with RB 12/10 and she verbalized understanding. Nothing further needed.

## 2019-12-11 NOTE — Telephone Encounter (Signed)
This was not imaging this was a procedure done by vein and vascular.  If the patient is having acute and worsening shortness of breath was seen on transported to the emergency room from EMS?If symptoms have not improved and he is also having ongoing nausea then he can either be scheduled as a virtual visit but there is a high likelihood that he may be routed to an urgent care or an emergency room for further evaluation.Are they able to check oxygen levels at home?  How are his oxygen levels?  Acute symptoms such as cough, wheezing, fatigue, body aches, chills?  Wyn Quaker, FNP

## 2019-12-11 NOTE — ED Notes (Signed)
Dr Marshall at bedside

## 2019-12-11 NOTE — ED Triage Notes (Signed)
Pt arrives POV for acute onset SOB that woke him from sleep last night at 0330. Pt reports that he had a stent placed on Tuesday, and noted acute onset SOB last night. Pt is tachypnic, air hungry in triage, satting 100% on RA, but appears dyspneic during conversation. Pt reports hx of pericardial effusion a few months ago where he had to have a pericardiocentesis to relieve the fluid. Pt is ill appearing in triage.

## 2019-12-11 NOTE — ED Notes (Signed)
Spo2 is not reading well, attempt placement on several fingers as well as on earlobe.  Pt continues to reports sob and is tachypneic.  Placed pt on 4L [place] at this time.  Pt reports that he is feeling much better with the O2.

## 2019-12-11 NOTE — Progress Notes (Signed)
Responded to Code Blue.  Offered silent prayer for patient and medical staff.  Pulse re-established and CPR discontinued.  There being nothing more I could do, I left the unit.  Chaplain is available at any time if needed.  De [facility]rs Chaplain Resident

## 2019-12-11 NOTE — Code Documentation (Signed)
CODE BLUE NOTE  Patient Name: Glen Green   MRN: [id number]   Date of Birth/ Sex: 08/16/1941 , male      Admission Date: 2019/12/04  Attending Provider: Audria Nine, DO  Primary Diagnosis: <principal problem not specified>    Indication: Pt was in his usual state of health until this PM, when he was noted to be PEA. Code blue was subsequently called.   Technical Description:  CPR performance duration:  34 minutes  Was defibrillation or cardioversion used? No   Was external pacer placed? No  Was patient intubated pre/post CPR? Yes    Medications Administered: Y = Yes; Blank = No Amiodarone    Atropine   Y  Calcium   Y  Epinephrine   Y  Lidocaine    Magnesium    Norepinephrine   Y  Phenylephrine    Sodium bicarbonate   Y  Vasopressin      Post CPR evaluation:  Final Status - Patient initially resuscitated but had ongoing bradycardia and hypotension despite multiple pressors eventually leading to recurrent arrest with PEA.  Made DNR by family.  Pt pronounced at 02/07/2113  Family notified and met in pt's room after their arrival.    [name], [facility] December 04, 2019, 10:47 PM

## 2019-12-11 NOTE — ED Notes (Signed)
ED TO INPATIENT HANDOFF REPORT  ED Nurse Name and Phone #: Hannie [phone]  S Name/Age/Gender Glen Green 79 y.o. male Room/Bed: 024C/024C  Code Status   Code Status: Full Code  Home/SNF/Other Home Patient oriented to: self, place, time and situation Is this baseline? Yes   Triage Complete: Triage complete  Chief Complaint sob post op  Triage Note Pt arrives POV for acute onset SOB that woke him from sleep last night at 0330. Pt reports that he had a stent placed on Tuesday, and noted acute onset SOB last night. Pt is tachypnic, air hungry in triage, satting 100% on RA, but appears dyspneic during conversation. Pt reports hx of pericardial effusion a few months ago where he had to have a pericardiocentesis to relieve the fluid. Pt is ill appearing in triage.    Allergies Allergies  Allergen Reactions  . Quinolones Other (See Comments)    Patient was warned about not using Cipro and similar antibiotics. Recent studies have raised concern that fluoroquinolone antibiotics could be associated with an increased risk of aortic aneurysm Fluoroquinolones have non-antimicrobial properties that might jeopardise the integrity of the extracellular matrix of the vascular wall In a  propensity score matched cohort study in [place], there was a 66% increased rate of aortic aneurysm or dissection associated with oral fluoroquinolone use, compared wit    Level of Care/Admitting Diagnosis ED Disposition    ED Disposition Condition [place]: [place] [[id number]]  Level of Care: ICU [6]  Covid Evaluation: Confirmed COVID Negative  Diagnosis: Respiratory failure, acute [facility]) [[id number]]  Admitting Physician: Audria Nine [[id number]]  Attending Physician: Audria Nine [phone]  Estimated length of stay: 5 - 7 days  Certification:: I certify this patient will need inpatient services for at least 2 midnights  PT Class (Do Not Modify): Inpatient [101]   PT Acc Code (Do Not Modify): Private [1]       B Medical/Surgery History Past Medical History:  Diagnosis Date  . AAA (abdominal aortic aneurysm) ([place]) 08/22/2012   3.9 cm by CT  - June 2013, stable   . 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 Atrial fibrillation ([place])   . CAD (coronary artery disease)   . 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 Carotid artery occlusion   . Cerebral aneurysm    [place]. repair  1994  . Chronic combined systolic (congestive) and diastolic (congestive) heart failure ([place])   . Dyslipidemia   . History of esophageal cancer    s/p transhiatal esophagogastrectomy  . History of lung cancer   . Hypothyroidism   . Pneumonia   . Postoperative atrial fibrillation ([place]) 12/04/2013   Short course of amiodarone, resolved. Postop bypass  . SBO (small bowel obstruction) ([place]) 08/15/2012   History of small bowel obstruction (status post small bowel       resection, lysis of adhesions, incidental appendectomy, and repair       of left diaphragmatic hernia  . Stroke [facility]) 1995   denies residual   Past Surgical History:  Procedure Laterality Date  . ABDOMINAL AORTIC ENDOVASCULAR FENESTRATED STENT GRAFT N/A 02/25/2019   Procedure: ABDOMINAL AORTIC ENDOVASCULAR FENESTRATED STENT GRAFT;  Surgeon: Serafina Mitchell, MD;  Location: [facility];  Service: Vascular;  Laterality: N/A;  . ABDOMINAL AORTOGRAM W/LOWER EXTREMITY Bilateral 11/10/2019   Procedure: ABDOMINAL AORTOGRAM W/LOWER EXTREMITY;  Surgeon: Serafina Mitchell, MD;  Location: [place] CV  LAB;  Service: Cardiovascular;  Laterality: Bilateral;  . BRAIN SURGERY     brain aneursyn  . CATARACT EXTRACTION W/ INTRAOCULAR LENS  IMPLANT, BILATERAL    . CHOLECYSTECTOMY    . CORONARY ARTERY BYPASS GRAFT N/A 07/30/2013   Procedure: CORONARY ARTERY BYPASS GRAFTING (CABG) times two on pump using left internal mammary artery and left greater saphenous vein via endovein harvest.;  Surgeon: Gaye Pollack, MD;  Location: [facility] OR;  Service: Open Heart Surgery;  Laterality: N/A;  . Exploratory laparotomy, lysis of adhesions, reduce of incarcerated small bowel and colon from the chest, limited small bowel resection, incidental appendectomy, and then repair of diaphragmatic hernia with AlloDerm mesh  10/27/2009   [place]  . Exploratory laparotomy,exploratory thoracotomy for hemorrhage  02/11/2003   [place]  . LEFT HEART CATHETERIZATION WITH CORONARY ANGIOGRAM N/A 07/30/2013   Procedure: LEFT HEART CATHETERIZATION WITH CORONARY ANGIOGRAM;  Surgeon: Peter M Martinique, MD;  Location: [facility] CATH LAB;  Service: Cardiovascular;  Laterality: N/A;  . Left subclavian Port- A-Cath insertion  03/09/2004   [place] Done  . left upper lobectomy with node dissection  02/24/2010   [place]  . PERIPHERAL VASCULAR ATHERECTOMY Left 11/10/2019   Procedure: PERIPHERAL VASCULAR ATHERECTOMY;  Surgeon: Serafina Mitchell, MD;  Location: [place] CV LAB;  Service: Cardiovascular;  Laterality: Left;  SFA and TP TRUNK  . PERIPHERAL VASCULAR BALLOON ANGIOPLASTY Left 11/10/2019   Procedure: PERIPHERAL VASCULAR BALLOON ANGIOPLASTY;  Surgeon: Serafina Mitchell, MD;  Location: [place] CV LAB;  Service: Cardiovascular;  Laterality: Left;  SFA and TP TRUNK  . TONSILLECTOMY    . Transhiatal esophagectomy with cholecystectomy, jejunostomy,  pyloroplasty and removal of right subclavian Port-A- Cath     [place]     A IV Location/Drains/Wounds Patient Lines/Drains/Airways Status   Active Line/Drains/Airways    Name:   Placement date:   Placement time:   Site:   Days:   Peripheral IV 2019/11/22 Left Antecubital   2019/11/22    1240    Antecubital   less than 1          Intake/Output Last 24 hours No intake or output data in the 24 hours ending 11-22-2019 1822  Labs/Imaging Results for orders placed or performed during the hospital encounter of 2019/11/22 (from the past 48 hour(s))  Comprehensive metabolic panel     Status: Abnormal    Collection Time: 22-Nov-2019 12:30 PM  Result Value Ref Range   Sodium 137 135 - 145 mmol/L   Potassium 4.9 3.5 - 5.1 mmol/L   Chloride 104 98 - 111 mmol/L   CO2 20 (L) 22 - 32 mmol/L   Glucose, Bld 144 (H) 70 - 99 mg/dL   BUN 35 (H) 8 - 23 mg/dL   Creatinine, Ser 1.66 (H) 0.61 - 1.24 mg/dL   Calcium 9.3 8.9 - 10.3 mg/dL   Total Protein 6.6 6.5 - 8.1 g/dL   Albumin 3.3 (L) 3.5 - 5.0 g/dL   AST 42 (H) 15 - 41 U/L   ALT 30 0 - 44 U/L   Alkaline Phosphatase 112 38 - 126 U/L   Total Bilirubin 1.3 (H) 0.3 - 1.2 mg/dL   GFR calc non Af Amer 39 (L) >60 mL/min   GFR calc Af Amer 45 (L) >60 mL/min   Anion gap 13 5 - 15    Comment: Performed at [facility], [address], [place], [place] [address]  CBC WITH DIFFERENTIAL     Status: Abnormal  Collection Time: 11/23/2019 12:30 PM  Result Value Ref Range   WBC 9.8 4.0 - 10.5 K/uL   RBC 2.91 (L) 4.22 - 5.81 MIL/uL   Hemoglobin 9.1 (L) 13.0 - 17.0 g/dL   HCT 28.7 (L) 39.0 - 52.0 %   MCV 98.6 80.0 - 100.0 fL   MCH 31.3 26.0 - 34.0 pg   MCHC 31.7 30.0 - 36.0 g/dL   RDW 17.0 (H) 11.5 - 15.5 %   Platelets 192 150 - 400 K/uL   nRBC 0.0 0.0 - 0.2 %   Neutrophils Relative % 67 %   Neutro Abs 6.4 1.7 - 7.7 K/uL   Lymphocytes Relative 20 %   Lymphs Abs 2.0 0.7 - 4.0 K/uL   Monocytes Relative 13 %   Monocytes Absolute 1.3 (H) 0.1 - 1.0 K/uL   Eosinophils Relative 0 %   Eosinophils Absolute 0.0 0.0 - 0.5 K/uL   Basophils Relative 0 %   Basophils Absolute 0.0 0.0 - 0.1 K/uL   Immature Granulocytes 0 %   Abs Immature Granulocytes 0.04 0.00 - 0.07 K/uL    Comment: Performed at [facility], [address] [place], [place] [address]  Brain natriuretic peptide     Status: Abnormal   Collection Time: 11/23/2019 12:30 PM  Result Value Ref Range   B Natriuretic Peptide 1,981.8 (H) 0.0 - 100.0 pg/mL    Comment: Performed at [facility] [address] [place], [place] [address]  D-dimer, quantitative (not at [facility])     Status:  Abnormal   Collection Time: 11-23-19 12:30 PM  Result Value Ref Range   D-Dimer, Quant 3.83 (H) 0.00 - 0.50 ug/mL-FEU    Comment: (NOTE) At the manufacturer cut-off of 0.50 ug/mL FEU, this assay has been documented to exclude PE with a sensitivity and negative predictive value of 97 to 99%.  At this time, this assay has not been approved by the FDA to exclude DVT/VTE. Results should be correlated with clinical presentation. Performed at [facility], [place] [address] [place], [place] [address]   Troponin I (High Sensitivity)     Status: Abnormal   Collection Time: Nov 23, 2019 12:30 PM  Result Value Ref Range   Troponin I (High Sensitivity) 301 (HH) <18 ng/L    Comment: CRITICAL RESULT CALLED TO, READ BACK BY AND VERIFIED WITH: DR LOCKWOOD @ 9518 2019/11/23 LEONARD,A (NOTE) Elevated high sensitivity troponin I (hsTnI) values and significant  changes across serial measurements may suggest ACS but many other  chronic and acute conditions are known to elevate hsTnI results.  Refer to the Links section for chest pain algorithms and additional  guidance. Performed at [facility], [place] [address] [place], [place] [address]   Troponin I (High Sensitivity)     Status: Abnormal   Collection Time: November 23, 2019  3:18 PM  Result Value Ref Range   Troponin I (High Sensitivity) 760 (HH) <18 ng/L    Comment: CRITICAL VALUE NOTED.  VALUE IS CONSISTENT WITH PREVIOUSLY REPORTED AND CALLED VALUE. (NOTE) Elevated high sensitivity troponin I (hsTnI) values and significant  changes across serial measurements may suggest ACS but many other  chronic and acute conditions are known to elevate hsTnI results.  Refer to the Links section for chest pain algorithms and additional  guidance. Performed at [facility], [place] [address], [place], [place] [address]   POC SARS Coronavirus 2 Ag-ED - Nasal Swab (BD Veritor Kit)     Status: None  Collection Time: 2019/12/12  3:41 PM  Result Value Ref Range    SARS Coronavirus 2 Ag NEGATIVE NEGATIVE    Comment: (NOTE) SARS-CoV-2 antigen NOT DETECTED.  Negative results are presumptive.  Negative results do not preclude SARS-CoV-2 infection and should not be used as the sole basis for treatment or other patient management decisions, including infection  control decisions, particularly in the presence of clinical signs and  symptoms consistent with COVID-19, or in those who have been in contact with the virus.  Negative results must be combined with clinical observations, patient history, and epidemiological information. The expected result is Negative. Fact Sheet for Patients: PodPark.tn Fact Sheet for Healthcare Providers: GiftContent.is This test is not yet approved or cleared by the [place] FDA and  has been authorized for detection and/or diagnosis of SARS-CoV-2 by FDA under an Emergency Use Authorization (EUA).  This EUA will remain in effect (meaning this test can be used) for the duration of  the COVID-19 de claration under Section 564(b)(1) of the Act, 21 U.S.C. section 360bbb-3(b)(1), unless the authorization is terminated or revoked sooner.   I-STAT 7, (LYTES, BLD GAS, ICA, H+H)     Status: Abnormal   Collection Time: Dec 12, 2019  5:14 PM  Result Value Ref Range   pH, Arterial 7.325 (L) 7.350 - 7.450   pCO2 arterial 23.4 (L) 32.0 - 48.0 mmHg   pO2, Arterial 330.0 (H) 83.0 - 108.0 mmHg   Bicarbonate 12.2 (L) 20.0 - 28.0 mmol/L   TCO2 13 (L) 22 - 32 mmol/L   O2 Saturation 100.0 %   Acid-base deficit 12.0 (H) 0.0 - 2.0 mmol/L   Sodium 133 (L) 135 - 145 mmol/L   Potassium 6.4 (HH) 3.5 - 5.1 mmol/L   Calcium, Ion 1.16 1.15 - 1.40 mmol/L   HCT 30.0 (L) 39.0 - 52.0 %   Hemoglobin 10.2 (L) 13.0 - 17.0 g/dL   Patient temperature 98.0 F    Sample type ARTERIAL    Comment NOTIFIED PHYSICIAN   Urinalysis, Routine w reflex microscopic     Status: Abnormal   Collection  Time: 12/12/19  5:17 PM  Result Value Ref Range   Color, Urine YELLOW YELLOW   APPearance HAZY (A) CLEAR   Specific Gravity, Urine 1.024 1.005 - 1.030   pH 5.0 5.0 - 8.0   Glucose, UA NEGATIVE NEGATIVE mg/dL   Hgb urine dipstick NEGATIVE NEGATIVE   Bilirubin Urine NEGATIVE NEGATIVE   Ketones, ur NEGATIVE NEGATIVE mg/dL   Protein, ur 30 (A) NEGATIVE mg/dL   Nitrite NEGATIVE NEGATIVE   Leukocytes,Ua NEGATIVE NEGATIVE   RBC / HPF 0-5 0 - 5 RBC/hpf   WBC, UA 0-5 0 - 5 WBC/hpf   Bacteria, UA FEW (A) NONE SEEN   Mucus PRESENT    Hyaline Casts, UA PRESENT     Comment: Performed at [facility], [address] [place], [place] [address]   Ct Angio Chest Pe W And/or Wo Contrast  Result Date: 12-12-2019 CLINICAL DATA:  Pt arrives POV for acute onset SOB that woke him from sleep last night at 0330. Pt reports that he had a stent placed on Tuesday, and noted acute onset SOB last night. Pt is tachypnic EXAM: CT ANGIOGRAPHY CHEST WITH CONTRAST TECHNIQUE: Multidetector CT imaging of the chest was performed using the standard protocol during bolus administration of intravenous contrast. Multiplanar CT image reconstructions and MIPs were obtained to evaluate the vascular anatomy. CONTRAST:  131m OMNIPAQUE IOHEXOL 350 MG/ML SOLN COMPARISON:  10/14/2019 FINDINGS: Cardiovascular: There is dense opacification of the pulmonary arteries to the segmental level. There is no aortic opacifications. There is no convincing pulmonary embolus. Heart is mildly enlarged. There are changes from prior CABG surgery and coronary artery calcifications. No pericardial effusion. Ascending aorta dilated to 4.2 cm. Mediastinum/Nodes: Prominent shotty lymph nodes along the right paratracheal mediastinum, largest 12 mm in short axis. No mediastinal or hilar masses. Trachea is widely patent esophagus is mildly distended, lung a well-defined distally. Small moderate size hiatal hernia. Lungs/Pleura: Small right and trace left  pleural effusions. On the right pleural fluid is loculated along the fissures with a nodular appearance, which has increased prior exam. Lobulated type posterior pleural fluid is now noted extending to the right apex. There is interstitial thickening that is most evident in lower lungs, particularly the right lower lobe and, to lesser degree, right middle lobe with significant peribronchial thickening. This has also increased from prior exam. There are underlying changes of moderate to advanced centrilobular emphysema, stable. No pneumothorax. Upper Abdomen: No acute findings.  Aortic vascular stent. Musculoskeletal: No fracture or acute finding. No osteoblastic or osteolytic lesions. Review of the MIP images confirms the above findings. IMPRESSION: 1. No evidence of a pulmonary embolism. 2. Loculated right pleural effusion has increased from the prior study as has right lower middle lobe interstitial thickening. Interstitial thickening is felt likely to reflect asymmetric interstitial pulmonary edema congestive heart failure. 3. Contrast extends from the superior vena cava into the azygos arch and to spinal venous branches, and distends the inferior vena cava and hepatic venous branches. These findings suggest right heart failure. 4. Dilated ascending thoracic aorta to 4.2 cm, unchanged from prior CT allowing for measurement differences. Ascending thoracic aortic aneurysm. Recommend semi-annual imaging followup by CTA or MRA and referral to cardiothoracic surgery if not already obtained. This recommendation follows 2010 ACCF/AHA/AATS/ACR/ASA/SCA/SCAI/SIR/STS/SVM Guidelines for the Diagnosis and Management of Patients With Thoracic Aortic Disease. Circulation. 2010; 121: L275-T700. Aortic aneurysm NOS (ICD10-I71.9) 5. Moderate to advanced centrilobular emphysema. 6. Coronary artery and aortic atherosclerotic calcifications. Aortic Atherosclerosis (ICD10-I70.0) and Emphysema (ICD10-J43.9). Electronically Signed   By:  Lajean Manes M.D.   On: Nov 28, 2019 14:58   Dg Chest Port 1 View  Result Date: 11-28-2019 CLINICAL DATA:  Shortness of breath.  Chest pain. EXAM: PORTABLE CHEST 1 VIEW COMPARISON:  10/15/2019.  CT 10/14/2019. FINDINGS: Prior CABG. Heart size stable. Left lower lobe atelectatic changes again noted. Fissural fluid on the right again noted without significant interim change. Stable small bilateral pleural effusions again noted. No pneumothorax. IMPRESSION: 1.  Left lower lobe atelectatic changes again noted. 2. Stable small bilateral pleural effusions. Unchanged fissural fluid on the right noted. Electronically Signed   By: Marcello Moores  Register   On: 28-Nov-2019 13:15   [place] Ekg Site Rite  Result Date: 11/28/19 If Site Rite image not attached, placement could not be confirmed due to current cardiac rhythm.   Pending Labs Unresulted Labs (From admission, onward)    Start     Ordered   11/13/19 0500  CBC  Tomorrow morning,   R     11-28-19 1629   11/13/19 1749  Basic metabolic panel  Tomorrow morning,   R     11/28/2019 1629   11/13/19 0500  Magnesium  Tomorrow morning,   R     11/28/19 1629   11/13/19 0500  Phosphorus  Tomorrow morning,   R     11/28/2019 1629   2019-11-28 4496  Basic metabolic  panel  ONCE - STAT,   STAT     11-30-19 1753   11-30-19 1624  Type and screen If need to transfuse blood products please use the blood administration order set  Once,   STAT    Comments: If need to transfuse blood products please use the blood administration order set    11-30-2019 1629   November 30, 2019 1620  SARS CORONAVIRUS 2 (TAT 6-24 HRS) Nasopharyngeal Nasopharyngeal Swab  (Asymptomatic/Tier 3)  Once,   STAT    Question Answer Comment  Is this test for diagnosis or screening Screening   Symptomatic for COVID-19 as defined by CDC No   Hospitalized for COVID-19 No   Admitted to ICU for COVID-19 No   Previously tested for COVID-19 Yes   Resident in a congregate (group) care setting Unknown   Employed in  healthcare setting No      30-Nov-2019 1620   2019-11-30 1616  CBC  (heparin)  Once,   STAT    Comments: Baseline for heparin therapy IF NOT ALREADY DRAWN.  Notify MD if PLT < 100 K.    30-Nov-2019 1629          Vitals/Pain Today's Vitals   November 30, 2019 1628 11/30/2019 1632 Nov 30, 2019 1730 2019/11/30 1740  BP:  95/74    Pulse:   78 79  Resp:  (!) 32 (!) 34 (!) 43  Temp: (!) 96.9 F (36.1 C)     TempSrc: Rectal     SpO2:      Weight:      Height:      PainSc:        Isolation Precautions Airborne and Contact precautions  Medications Medications  heparin injection 5,000 Units (has no administration in time range)  acetaminophen (TYLENOL) tablet 650 mg (has no administration in time range)  ondansetron (ZOFRAN) injection 4 mg (4 mg Intravenous Given 30-Nov-2019 1739)  calcium gluconate 1 g/ 50 mL sodium chloride IVPB (1,000 mg Intravenous New Bag/Given 11-30-19 1821)  aspirin EC tablet 81 mg (has no administration in time range)  atorvastatin (LIPITOR) tablet 40 mg (has no administration in time range)  clopidogrel (PLAVIX) tablet 75 mg (has no administration in time range)  ipratropium-albuterol (DUONEB) 0.5-2.5 (3) MG/3ML nebulizer solution 3 mL (has no administration in time range)  0.9 %  sodium chloride infusion (has no administration in time range)  norepinephrine (LEVOPHED) '4mg'$  in 273m premix infusion (has no administration in time range)  iohexol (OMNIPAQUE) 350 MG/ML injection 100 mL (100 mLs Intravenous Contrast Given [id number])  furosemide (LASIX) injection 60 mg (60 mg Intravenous Given 1Dec 21, 20201515)    Mobility walks     Focused Assessments Pulmonary Assessment Handoff:  Lung sounds: Bilateral Breath Sounds: Clear O2 Device: Nasal Cannula O2 Flow Rate (L/min): 5 L/min      R Recommendations: See Admitting Provider Note  Report given to:   Additional Notes:

## 2019-12-11 NOTE — Telephone Encounter (Signed)
Called pt and spoke with his wife Silva Bandy to verify what procedure was performed on pt and when. Silva Bandy said pt had an angiogram performed around 10:30 on 12/1 at [facility] in the procedures tab of pt's chart and I see that pt had a peripheral vascular catheterization performed.  It was this morning 12/3 when EMS was called as pt was having difficulty breathing. Pt was not taken to the hospital by EMS, just evaluated by them at house.

## 2019-12-11 NOTE — Telephone Encounter (Signed)
Primary Pulmonologist: RB Last office visit and with whom: 06/09/19 with RB What do we see them for (pulmonary problems): emphysema/ pleural effusion Last OV assessment/plan: Instructions  Please continue Stiolto once daily as you have been taking it. Keep your albuterol available use 2 puffs if needed for shortness of breath, chest tightness, wheezing. Get the flu shot in the fall Follow with Dr Lamonte Sakai in 6 months or sooner if you have any problems        Was appointment offered to patient (explain)?  Pt and wife want recommendations   Reason for call: Called and spoke with pt's daughter  Who stated pt had SOB 2 weeks ago and was admitted to hosp due to fluid. Pt had angiogram performed 2 days ago and after that pt was more SOB.  EMS was called and Prudencio Pair said that they could hear fluid and air in the lungs.  When EMS checked pt out, his BP was 105/72, O2 sats 97% on room air, CBG 187.  An EKG was also performed and that was normal.  Pt is doing stiolto inhaler daily and also taking all other meds as prescribed.  Along with pt beinf more SOB, pt also has been throwing up this morning.  Pt has an appt scheduled with office 12/10 for a follow up.   Aaron Edelman, please advise on recommendations for pt. Thanks!

## 2019-12-11 NOTE — ED Notes (Signed)
Called critical care per Dr.Lockwood.

## 2019-12-11 NOTE — Progress Notes (Signed)
[facility] Progress Note Patient Name: Glen Green DOB: 1940/12/14 MRN: [id number]   Date of Service  Dec 10, 2019  HPI/Events of Note  Notified of K 6.3, creatinine now 2.18. Received furosemide earlier. Ongoing calcium gluconate infusion. Titrating up norepinephrine.  eICU Interventions  Ordered D50/Insulin and Lokelma. Central line to be inserted for IV access, will have dialysis capable line inserted instead in the event potassium persitently elevated and fluid overload remains an issue.     Intervention Category Major Interventions: Electrolyte abnormality - evaluation and management;Hypotension - evaluation and management  Judd Lien 10-Dec-2019, 7:30 PM

## 2019-12-11 NOTE — Death Summary Note (Signed)
DEATH SUMMARY   Patient Details  Name: Glen Green MRN: [id number] DOB: 06-23-1941  Admission/Discharge Information   Admit Date:  21-Nov-2019  Date of Death: Date of Death: 11/21/19  Time of Death: Time of Death: 2113-02-25  Length of Stay: February 17, 2023  Referring Physician: Alroy Dust, L.Marlou Sa, MD   Reason(s) for Hospitalization  sob  Diagnoses  Preliminary cause of death: Cardiogenic shock ([place]) Secondary Diagnoses (including complications and co-morbidities):  Active Problems:   Respiratory failure, acute (HCC)   Respiratory distress   Hypotension   Renal failure   Acute on chronic right-sided congestive heart failure ([place])   Cardiac arrest [facility])   Brief Hospital Course (including significant findings, care, treatment, and services provided and events leading to death)  Glen Green is a 79 y.o. 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   Pt states he awoke this am with sudden sob around 330 that has gotten worse to point he is sob at rest. Denies any recent pnd or worsening orthopnea. He states he does see cardiology as outpt, I cannot find oupt notes in recent history within our system. He endorses he has recently been decreased on lasix dosing at home due to "frailty" and that he has had 10# wt loss over past month. He denies any cp/dizziness/light-headed. Denies cough/sputum production/recent sick contacts.   He also c/o n/v today.   He called his pulmonary dr's office who advised him to go to ED.   When pt presented he was found to be progressively hypoxic req NRB to maintain saturations. CTPA ordered and negative for acute PE. He was found to be volume overloaded with pulmonary edema and suspected decompensated HF. He was given 60mg  of lasix and no significant  output of yet. He was also maintaining marginal pressures.   Of note, pt has had multiple hospitalziations this year upon chart review, most recently 2/2 decompensated hfPt has recent vascular intervention in LLE with atherectomy of L pop a and L tibioperoneal trunk and was recovering at home without issue until this am.   Cardiology saw the pt for suspected decompensated hf. They recommended cvc placement with coox sent. Pt's bp began to drop and with concern for cardiogenic shock team started levo and perhaps based on coox dobutamine at fixed rate. Unfortunately, it appears after cvc placement pt quickly deteriorated and went into PEA. He was initially resuscitated and ROSC achieved but with escalating pressor requirement family made pt DNR and he expired around 2099/02/25 yesterday pm.   Family was updated by cross covering team.     Pertinent Labs and Studies  Significant Diagnostic Studies Dg Chest 1 View  Result Date: 10/15/2019 CLINICAL DATA:  Status post RIGHT thoracentesis. EXAM: CHEST  1 VIEW COMPARISON:  10/13/2019 FINDINGS: There is been slight decrease in RIGHT pleural effusion. No pneumothorax noted. Fluid within the RIGHT minor fissure is again noted. A small LEFT pleural effusion is again identified. Cardiomegaly and CABG changes noted. IMPRESSION: Slight decrease in RIGHT pleural effusion.  No pneumothorax. Electronically Signed   By: Margarette Canada M.D.   On: 10/15/2019 18:06   Ct Chest Wo Contrast  Result Date: 10/14/2019 CLINICAL DATA:  Acute respiratory illness. EXAM: CT CHEST WITHOUT CONTRAST TECHNIQUE: Multidetector CT imaging of the chest was  performed following the standard protocol without IV contrast. COMPARISON:  December 23, 2018 FINDINGS: Cardiovascular: The heart size is enlarged. Ascending aorta is aneurysmal measuring approximately 4.3 cm in diameter. The main pulmonary artery is borderline dilated measuring approximately 3.1 cm in diameter. Advanced coronary artery  calcifications are noted. Atherosclerotic changes are noted of the thoracic aorta. Mediastinum/Nodes: There are mildly enlarged mediastinal lymph nodes. These are relatively stable from prior study. The patient is status post prior soft ejected me. Lungs/Pleura: Moderate severe emphysematous changes are again noted. There is persistent architectural distortion of the left upper lung field. There is a multiloculated right-sided pleural effusion. The pleural fluid courses along the major and minor fissures. The effusion has increased in size since the prior study. Multiple pleural base calcified nodules are noted at the right lung base. There are few calcified pleural based plaques at the right lung base. Upper Abdomen: There are extensive postsurgical changes in the upper abdomen there are stable from prior study. No acute abnormality detected in the upper abdomen. Again noted is an exophytic nodule rising from the posterior interpolar region of the left kidney. This nodule has increased in size slightly from prior study in 2019. Right-sided nephrolithiasis is noted. Musculoskeletal: There is a chronic appearing Hill-Sachs deformity involving the proximal left humerus. IMPRESSION: 1. Evaluation is limited by lack of IV contrast. 2. Interval increase in size of a multiloculated right-sided pleural effusion. The effusion courses along the major and minor fissures. Given the patient's history of malignancy, sampling of this pleural fluid may be prudent for further evaluation. 3. Moderate to severe bilateral emphysematous changes. 4. Aneurysmal dilatation of the ascending aorta currently measuring approximately 4.3 cm. Recommend annual imaging followup by CTA or MRA. This recommendation follows 2010 ACCF/AHA/AATS/ACR/ASA/SCA/SCAI/SIR/STS/SVM Guidelines for the Diagnosis and Management of Patients with Thoracic Aortic Disease. Circulation. 2010; 121: K160-F093. Aortic aneurysm NOS (ICD10-I71.9) 5. Mild mediastinal and  hilar adenopathy, similar to prior study. This is presumably reactive. 6. Postsurgical changes related to prior esophagectomy. 7. Slight interval growth of an exophytic nodule rising from the posterior interpolar region of the left kidney. This is consistent with renal cell carcinoma until proven otherwise. 8. Right-sided nephrolithiasis. Aortic Atherosclerosis (ICD10-I70.0) and Emphysema (ICD10-J43.9). Electronically Signed   By: Constance Holster M.D.   On: 10/14/2019 23:08   Ct Angio Chest Pe W And/or Wo Contrast  Result Date: 11-16-19 CLINICAL DATA:  Pt arrives POV for acute onset SOB that woke him from sleep last night at 0330. Pt reports that he had a stent placed on Tuesday, and noted acute onset SOB last night. Pt is tachypnic EXAM: CT ANGIOGRAPHY CHEST WITH CONTRAST TECHNIQUE: Multidetector CT imaging of the chest was performed using the standard protocol during bolus administration of intravenous contrast. Multiplanar CT image reconstructions and MIPs were obtained to evaluate the vascular anatomy. CONTRAST:  134mL OMNIPAQUE IOHEXOL 350 MG/ML SOLN COMPARISON:  10/14/2019 FINDINGS: Cardiovascular: There is dense opacification of the pulmonary arteries to the segmental level. There is no aortic opacifications. There is no convincing pulmonary embolus. Heart is mildly enlarged. There are changes from prior CABG surgery and coronary artery calcifications. No pericardial effusion. Ascending aorta dilated to 4.2 cm. Mediastinum/Nodes: Prominent shotty lymph nodes along the right paratracheal mediastinum, largest 12 mm in short axis. No mediastinal or hilar masses. Trachea is widely patent esophagus is mildly distended, lung a well-defined distally. Small moderate size hiatal hernia. Lungs/Pleura: Small right and trace left pleural effusions. On the right pleural fluid is loculated along the  fissures with a nodular appearance, which has increased prior exam. Lobulated type posterior pleural fluid is now  noted extending to the right apex. There is interstitial thickening that is most evident in lower lungs, particularly the right lower lobe and, to lesser degree, right middle lobe with significant peribronchial thickening. This has also increased from prior exam. There are underlying changes of moderate to advanced centrilobular emphysema, stable. No pneumothorax. Upper Abdomen: No acute findings.  Aortic vascular stent. Musculoskeletal: No fracture or acute finding. No osteoblastic or osteolytic lesions. Review of the MIP images confirms the above findings. IMPRESSION: 1. No evidence of a pulmonary embolism. 2. Loculated right pleural effusion has increased from the prior study as has right lower middle lobe interstitial thickening. Interstitial thickening is felt likely to reflect asymmetric interstitial pulmonary edema congestive heart failure. 3. Contrast extends from the superior vena cava into the azygos arch and to spinal venous branches, and distends the inferior vena cava and hepatic venous branches. These findings suggest right heart failure. 4. Dilated ascending thoracic aorta to 4.2 cm, unchanged from prior CT allowing for measurement differences. Ascending thoracic aortic aneurysm. Recommend semi-annual imaging followup by CTA or MRA and referral to cardiothoracic surgery if not already obtained. This recommendation follows 2010 ACCF/AHA/AATS/ACR/ASA/SCA/SCAI/SIR/STS/SVM Guidelines for the Diagnosis and Management of Patients With Thoracic Aortic Disease. Circulation. 2010; 121: N629-B284. Aortic aneurysm NOS (ICD10-I71.9) 5. Moderate to advanced centrilobular emphysema. 6. Coronary artery and aortic atherosclerotic calcifications. Aortic Atherosclerosis (ICD10-I70.0) and Emphysema (ICD10-J43.9). Electronically Signed   By: Lajean Manes M.D.   On: December 10, 2019 14:58   Dg Chest Port 1 View  Result Date: 10-Dec-2019 CLINICAL DATA:  Shortness of breath.  Chest pain. EXAM: PORTABLE CHEST 1 VIEW  COMPARISON:  10/15/2019.  CT 10/14/2019. FINDINGS: Prior CABG. Heart size stable. Left lower lobe atelectatic changes again noted. Fissural fluid on the right again noted without significant interim change. Stable small bilateral pleural effusions again noted. No pneumothorax. IMPRESSION: 1.  Left lower lobe atelectatic changes again noted. 2. Stable small bilateral pleural effusions. Unchanged fissural fluid on the right noted. Electronically Signed   By: Marcello Moores  Register   On: 2019-12-10 13:15   [place] Ekg Site Rite  Result Date: Dec 10, 2019 If Site Rite image not attached, placement could not be confirmed due to current cardiac rhythm.   Microbiology Recent Results (from the past 240 hour(s))  SARS CORONAVIRUS 2 (TAT 6-24 HRS) Nasopharyngeal Nasopharyngeal Swab     Status: None   Collection Time: 11/06/19  9:12 AM   Specimen: Nasopharyngeal Swab  Result Value Ref Range Status   SARS Coronavirus 2 NEGATIVE NEGATIVE Final    Comment: (NOTE) SARS-CoV-2 target nucleic acids are NOT DETECTED. The SARS-CoV-2 RNA is generally detectable in upper and lower respiratory specimens during the acute phase of infection. Negative results do not preclude SARS-CoV-2 infection, do not rule out co-infections with other pathogens, and should not be used as the sole basis for treatment or other patient management decisions. Negative results must be combined with clinical observations, patient history, and epidemiological information. The expected result is Negative. Fact Sheet for Patients: SugarRoll.be Fact Sheet for Healthcare Providers: https://www.woods-mathews.com/ This test is not yet approved or cleared by the [place] FDA and  has been authorized for detection and/or diagnosis of SARS-CoV-2 by FDA under an Emergency Use Authorization (EUA). This EUA will remain  in effect (meaning this test can be used) for the duration of the COVID-19 declaration under  Section 56 4(b)(1) of the Act, 21  U.S.C. section 360bbb-3(b)(1), unless the authorization is terminated or revoked sooner. Performed at [facility], [place] [address] [place], [place] [address]   SARS CORONAVIRUS 2 (TAT 6-24 HRS) Nasopharyngeal Nasopharyngeal Swab     Status: None   Collection Time: 11-13-19  4:31 PM   Specimen: Nasopharyngeal Swab  Result Value Ref Range Status   SARS Coronavirus 2 NEGATIVE NEGATIVE Final    Comment: (NOTE) SARS-CoV-2 target nucleic acids are NOT DETECTED. The SARS-CoV-2 RNA is generally detectable in upper and lower respiratory specimens during the acute phase of infection. Negative results do not preclude SARS-CoV-2 infection, do not rule out co-infections with other pathogens, and should not be used as the sole basis for treatment or other patient management decisions. Negative results must be combined with clinical observations, patient history, and epidemiological information. The expected result is Negative. Fact Sheet for Patients: SugarRoll.be Fact Sheet for Healthcare Providers: https://www.woods-mathews.com/ This test is not yet approved or cleared by the [place] FDA and  has been authorized for detection and/or diagnosis of SARS-CoV-2 by FDA under an Emergency Use Authorization (EUA). This EUA will remain  in effect (meaning this test can be used) for the duration of the COVID-19 declaration under Section 56 4(b)(1) of the Act, 21 U.S.C. section 360bbb-3(b)(1), unless the authorization is terminated or revoked sooner. Performed at [facility], [place] [address], [place], [place] [address]     Lab Basic Metabolic Panel: Recent Labs  Lab 11/10/19 7342 November 13, 2019 1230 11-13-2019 1714 2019-11-13 1754 Nov 13, 2019 2105  NA 140 137 133* 137  --   K 4.7 4.9 6.4* 6.3* 6.1*  CL 106 104  --  106  --   CO2  --  20*  --  14*  --   GLUCOSE 100* 144*  --  99  --   BUN 35* 35*  --  36*  --    CREATININE 1.30* 1.66*  --  2.18*  --   CALCIUM  --  9.3  --  9.2  --    Liver Function Tests: Recent Labs  Lab November 13, 2019 1230  AST 42*  ALT 30  ALKPHOS 112  BILITOT 1.3*  PROT 6.6  ALBUMIN 3.3*   No results for input(s): LIPASE, AMYLASE in the last 168 hours. No results for input(s): AMMONIA in the last 168 hours. CBC: Recent Labs  Lab 11/10/19 0829 11/13/2019 1230 13-Nov-2019 1714 13-Nov-2019 1755  WBC  --  9.8  --  10.4  NEUTROABS  --  6.4  --   --   HGB 15.0 9.1* 10.2* 10.0*  HCT 44.0 28.7* 30.0* 32.8*  MCV  --  98.6  --  102.5*  PLT  --  192  --  176   Cardiac Enzymes: No results for input(s): CKTOTAL, CKMB, CKMBINDEX, TROPONINI in the last 168 hours. Sepsis Labs: Recent Labs  Lab 11/13/19 1230 2019-11-13 1755  WBC 9.8 10.4    Procedures/Operations  Central line 12/3 Intubation  12/3   Audria Nine 11/13/2019, 9:45 AM

## 2019-12-11 NOTE — Telephone Encounter (Signed)
We really need more information regarding the previous message.  What was the date and time that EMS was called?  When was this imaging?  Last imaging that I am seeing in chart was from November.  Is this what they are referring to?  Or has patient been emergently evaluated since then?Wyn Quaker, FNP

## 2019-12-11 DEATH — deceased

## 2019-12-14 ENCOUNTER — Encounter: Payer: Medicare Other | Admitting: Surgery

## 2019-12-14 ENCOUNTER — Encounter (HOSPITAL_COMMUNITY): Payer: Medicare Other

## 2019-12-31 IMAGING — DX DG CHEST 1V PORT
1 series · 1 of 1 positions shown · non-contrast
Comparison: February 25, 2019

CLINICAL DATA: Shortness of breath

EXAM:
PORTABLE CHEST 1 VIEW

[chest ap]
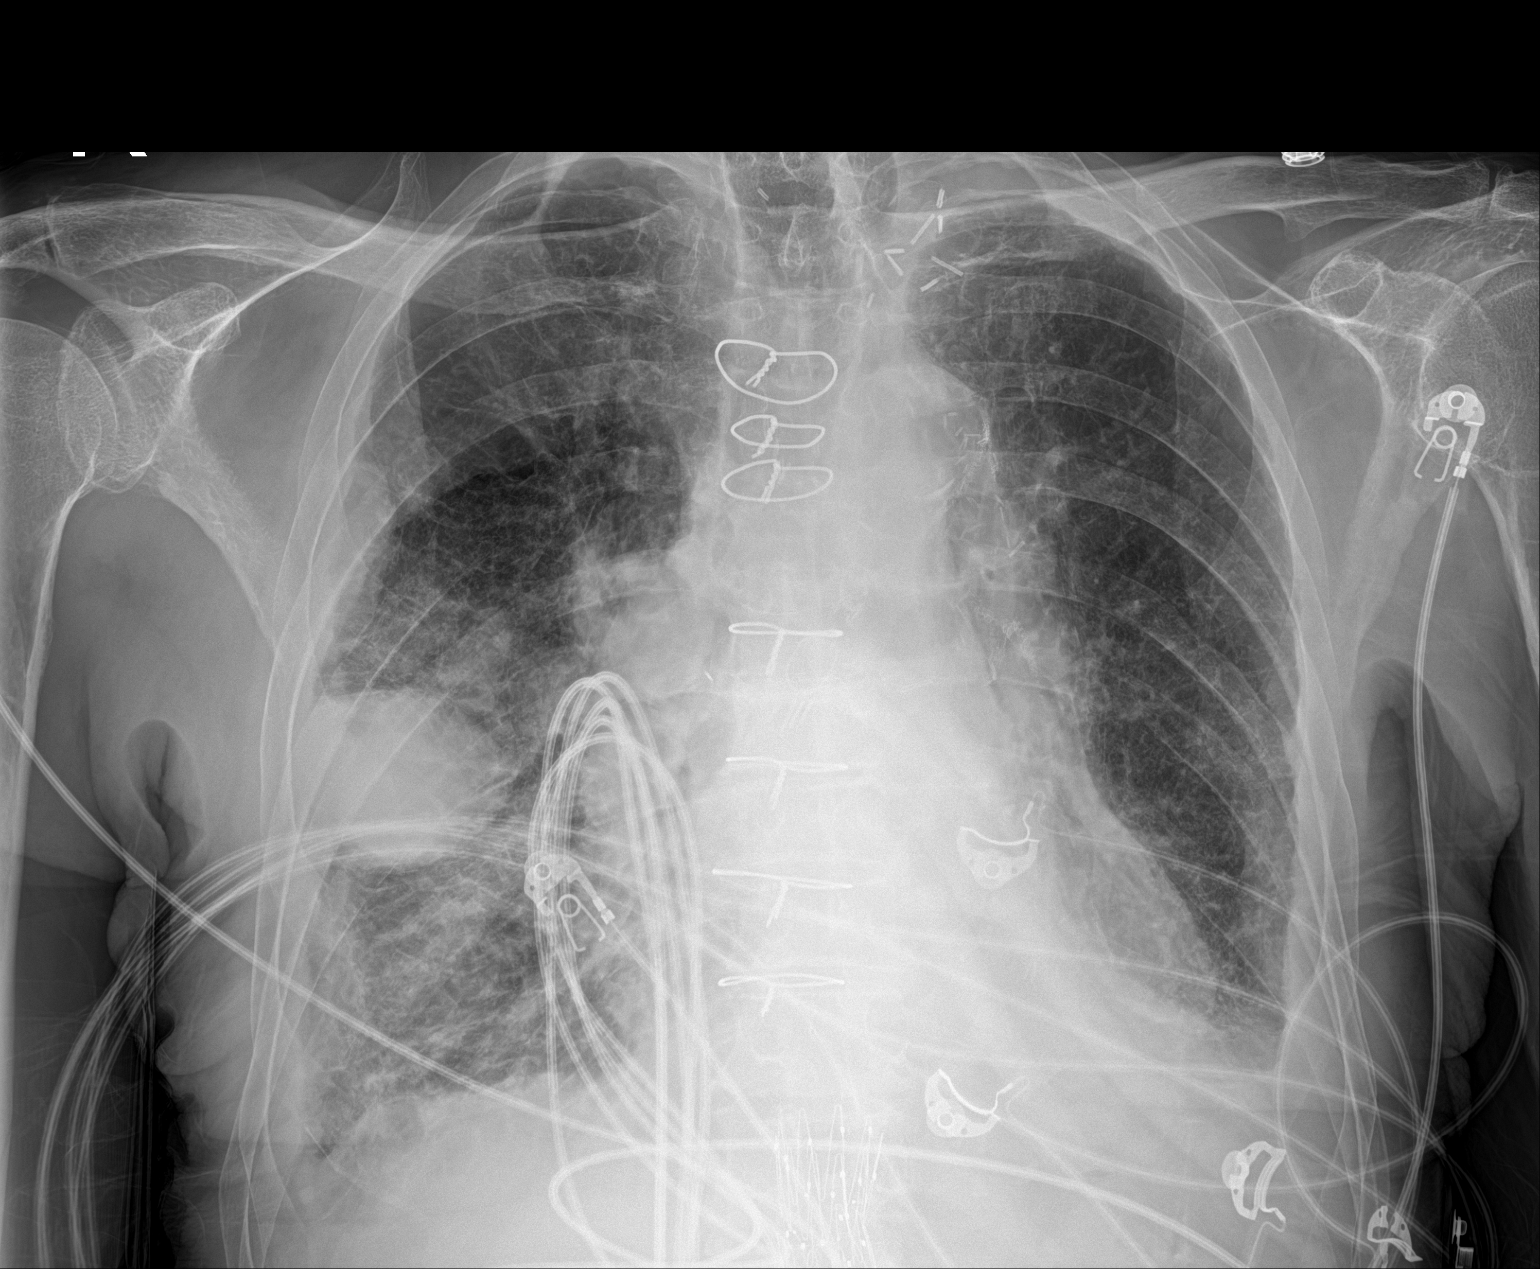

[1 of 1 positions shown; findings below may reference images not displayed]

FINDINGS: Heart size is enlarged. Aortic calcifications are noted. The patient
is status post prior median sternotomy. There is a rounded density
overlying the right mid lung zone favored to represent a loculated
right-sided pleural effusion. Emphysematous changes are noted
bilaterally. There are bibasilar airspace opacities which may
represent atelectasis or infiltrate.
IMPRESSION: 1. Loculated right-sided pleural effusion. There is a masslike
appearance of this pleural fluid in the right mid lung zone. This is
similar to prior CT from June 2019. Small left-sided pleural effusion.
3. Bibasilar airspace opacities favored to represent atelectasis,
however an infiltrate is not excluded.

## 2020-01-01 IMAGING — CT CT CHEST W/O CM
2 of 4 series · 14 of 36 positions shown, 17 images · non-contrast
Comparison: December 23, 2018

CLINICAL DATA: Acute respiratory illness.

EXAM:
CT CHEST WITHOUT CONTRAST
TECHNIQUE: Multidetector CT imaging of the chest was performed following the
standard protocol without IV contrast.

[Series 3: chest wo · axial · 0.74mm/px · z∈[+1029,+1303]mm · 11 of 163 slices shown, 14 images]
[im 13/163  mediastinal]
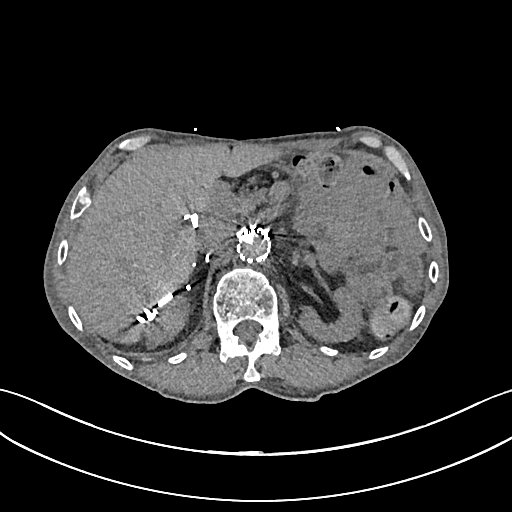
[im 13/163  lung]
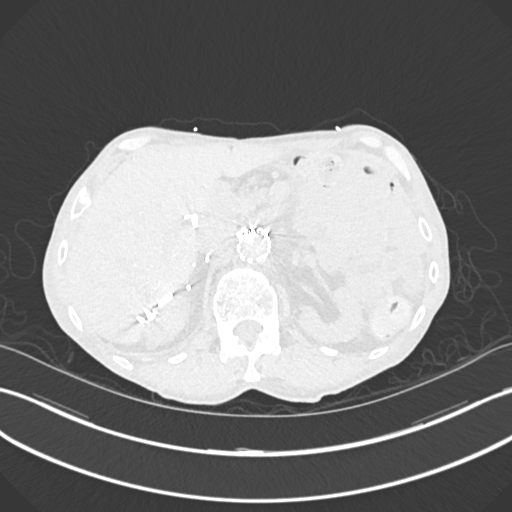
[im 25/163  lung]
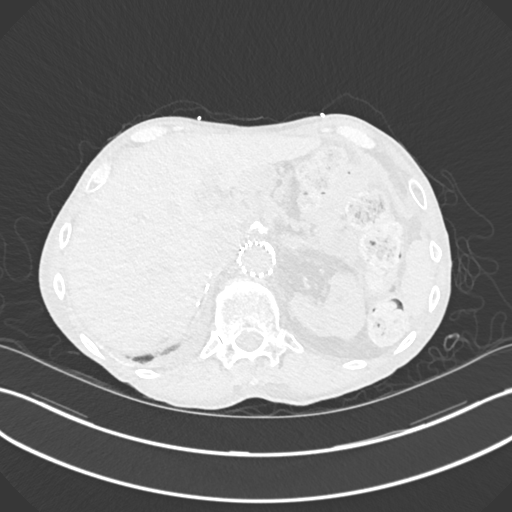
[im 38/163  lung]
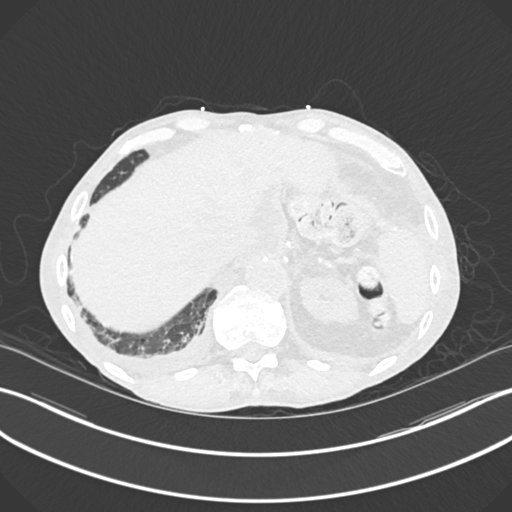
[im 50/163  lung]
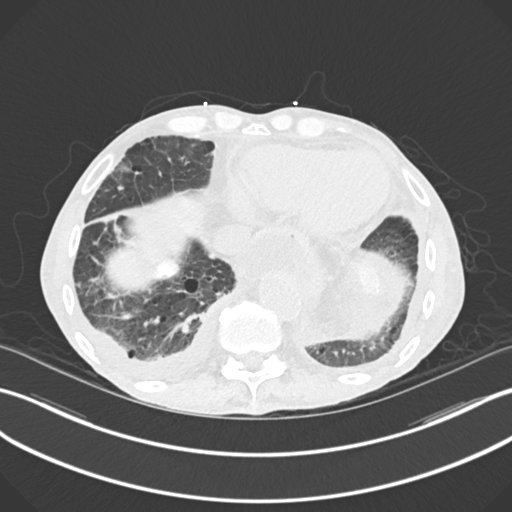
[im 63/163  mediastinal]
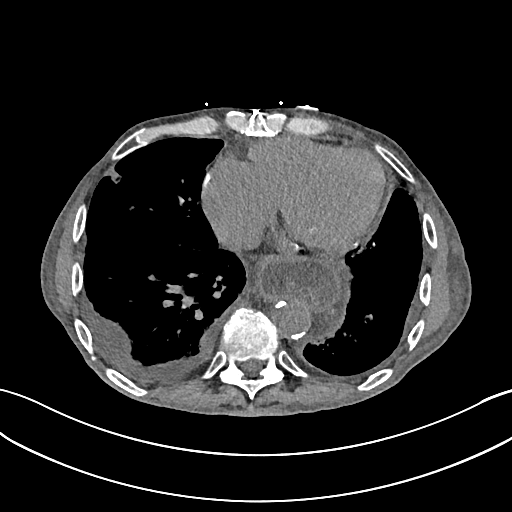
[im 63/163  lung]
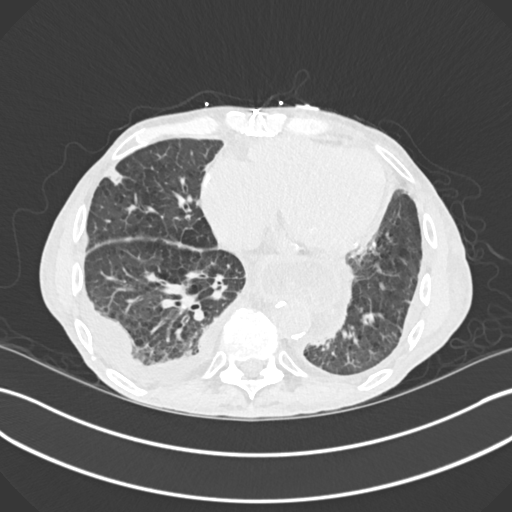
[im 88/163  lung]
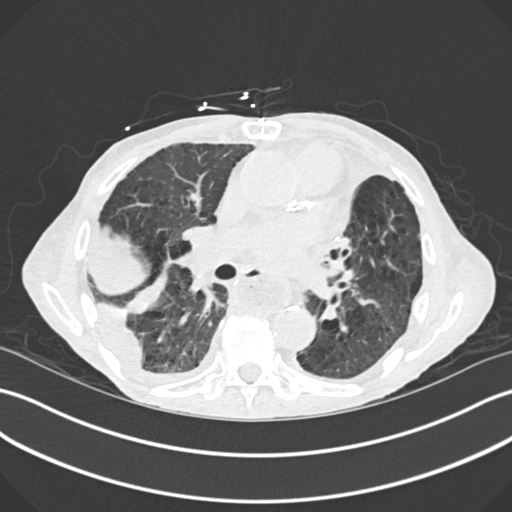
[im 100/163  lung]
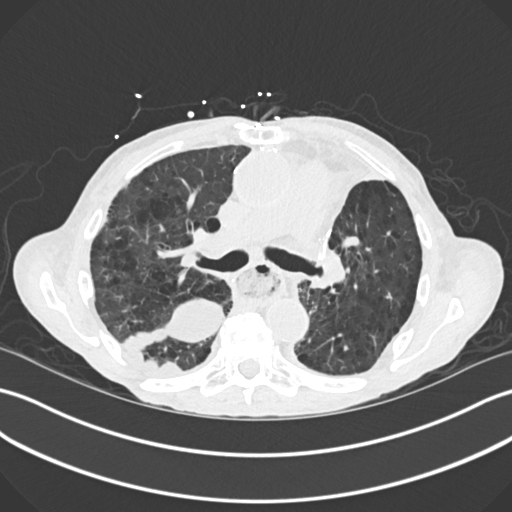
[im 113/163  lung]
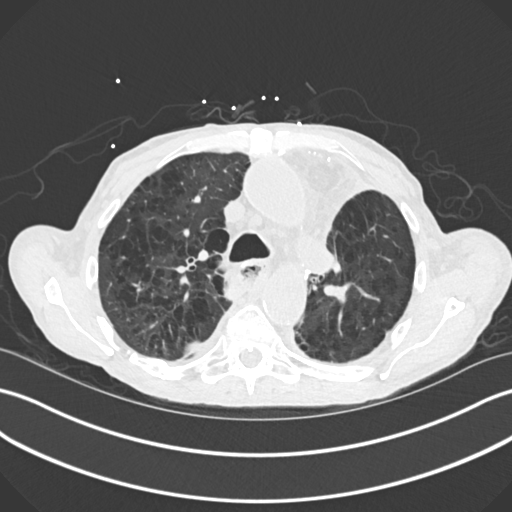
[im 125/163  mediastinal]
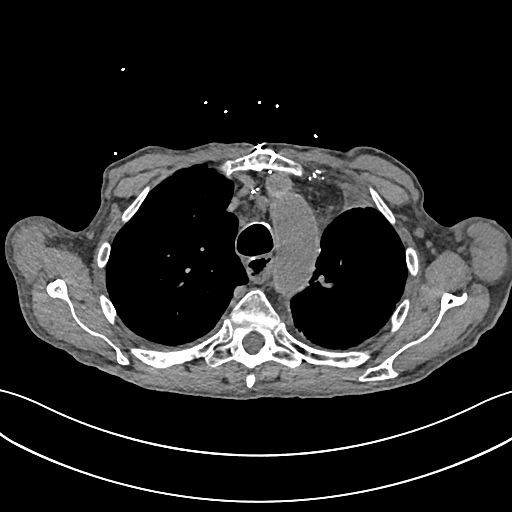
[im 125/163  lung]
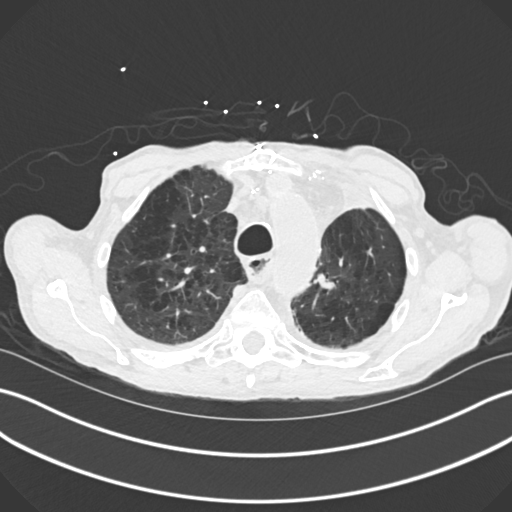
[im 138/163  lung]
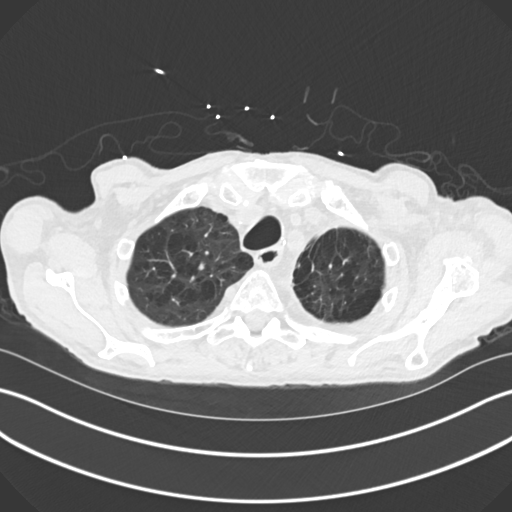
[im 150/163  lung]
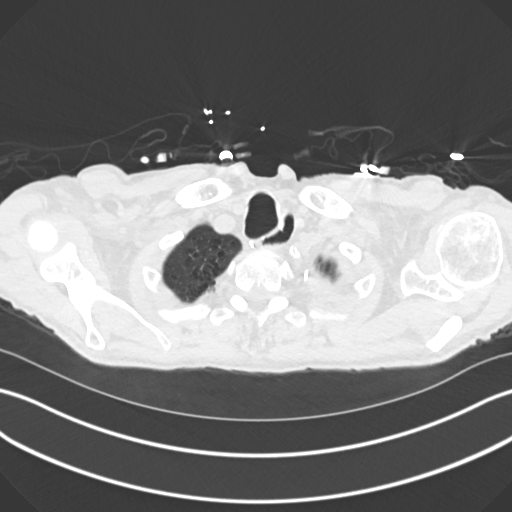

[Series 6: cor · coronal · 0.66mm/px · 3 of 122 slices shown]
[im 25/122  lung]
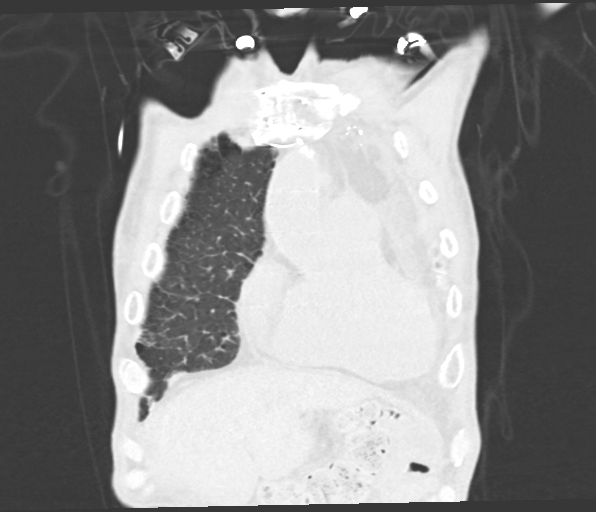
[im 49/122  lung]
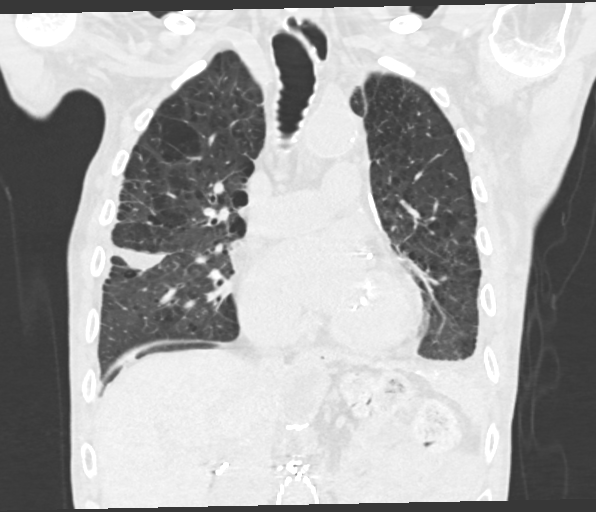
[im 73/122  lung]
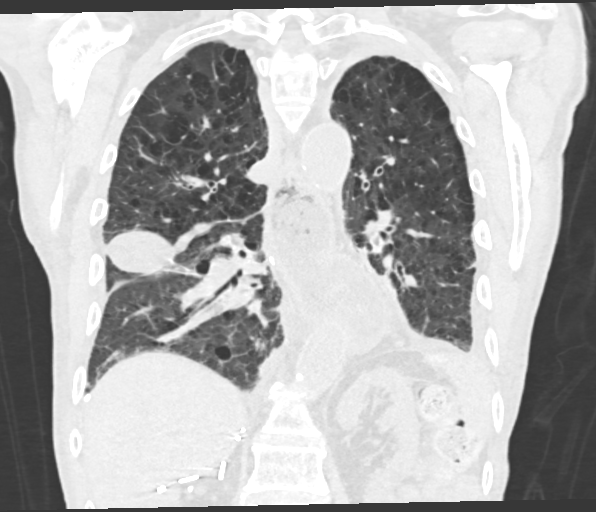

[14 of 36 positions shown; findings below may reference images not displayed]

FINDINGS: Cardiovascular: The heart size is enlarged. Ascending aorta is
aneurysmal measuring approximately 4.3 cm in diameter. The main
pulmonary artery is borderline dilated measuring approximately
cm in diameter. Advanced coronary artery calcifications are noted.
Atherosclerotic changes are noted of the thoracic aorta.

Mediastinum/Nodes: There are mildly enlarged mediastinal lymph
nodes. These are relatively stable from prior study. The patient is
status post prior soft ejected me.

Lungs/Pleura: Moderate severe emphysematous changes are again noted.
There is persistent architectural distortion of the left upper lung
field. There is a multiloculated right-sided pleural effusion. The
pleural fluid courses along the major and minor fissures. The
effusion has increased in size since the prior study. Multiple
pleural base calcified nodules are noted at the right lung base.
There are few calcified pleural based plaques at the right lung
base.

Upper Abdomen: There are extensive postsurgical changes in the upper
abdomen there are stable from prior study. No acute abnormality
detected in the upper abdomen. Again noted is an exophytic nodule
rising from the posterior interpolar region of the left kidney. This
nodule has increased in size slightly from prior study in 2540.
Right-sided nephrolithiasis is noted.

Musculoskeletal: There is a chronic appearing Hill-Sachs deformity
involving the proximal left humerus.
IMPRESSION: 1. Evaluation is limited by lack of IV contrast.
2. Interval increase in size of a multiloculated right-sided pleural
effusion. The effusion courses along the major and minor fissures.
Given the patient's history of malignancy, sampling of this pleural
fluid may be prudent for further evaluation.
3. Moderate to severe bilateral emphysematous changes.
4. Aneurysmal dilatation of the ascending aorta currently measuring
approximately 4.3 cm. Recommend annual imaging followup by CTA or
MRA. This recommendation follows 9535
ACCF/AHA/AATS/ACR/ASA/SCA/FUCHS/MOATSHE/[place]/NEU Guidelines for the
Diagnosis and Management of Patients with Thoracic Aortic Disease.
Circulation. 9535; 121: E266-e369. Aortic aneurysm NOS (WE112-4CO.R)
5. Mild mediastinal and hilar adenopathy, similar to prior study.
This is presumably reactive.
6. Postsurgical changes related to prior esophagectomy.
7. Slight interval growth of an exophytic nodule rising from the
posterior interpolar region of the left kidney. This is consistent
with renal cell carcinoma until proven otherwise.
8. Right-sided nephrolithiasis.

Aortic Atherosclerosis (WE112-ZCW.W) and Emphysema (WE112-J8M.I).

## 2020-01-02 IMAGING — DX DG CHEST 1V
1 series · 1 of 1 positions shown · non-contrast
Comparison: 10/13/2019

CLINICAL DATA: Status post RIGHT thoracentesis.

EXAM:
CHEST  1 VIEW

[chest ap]
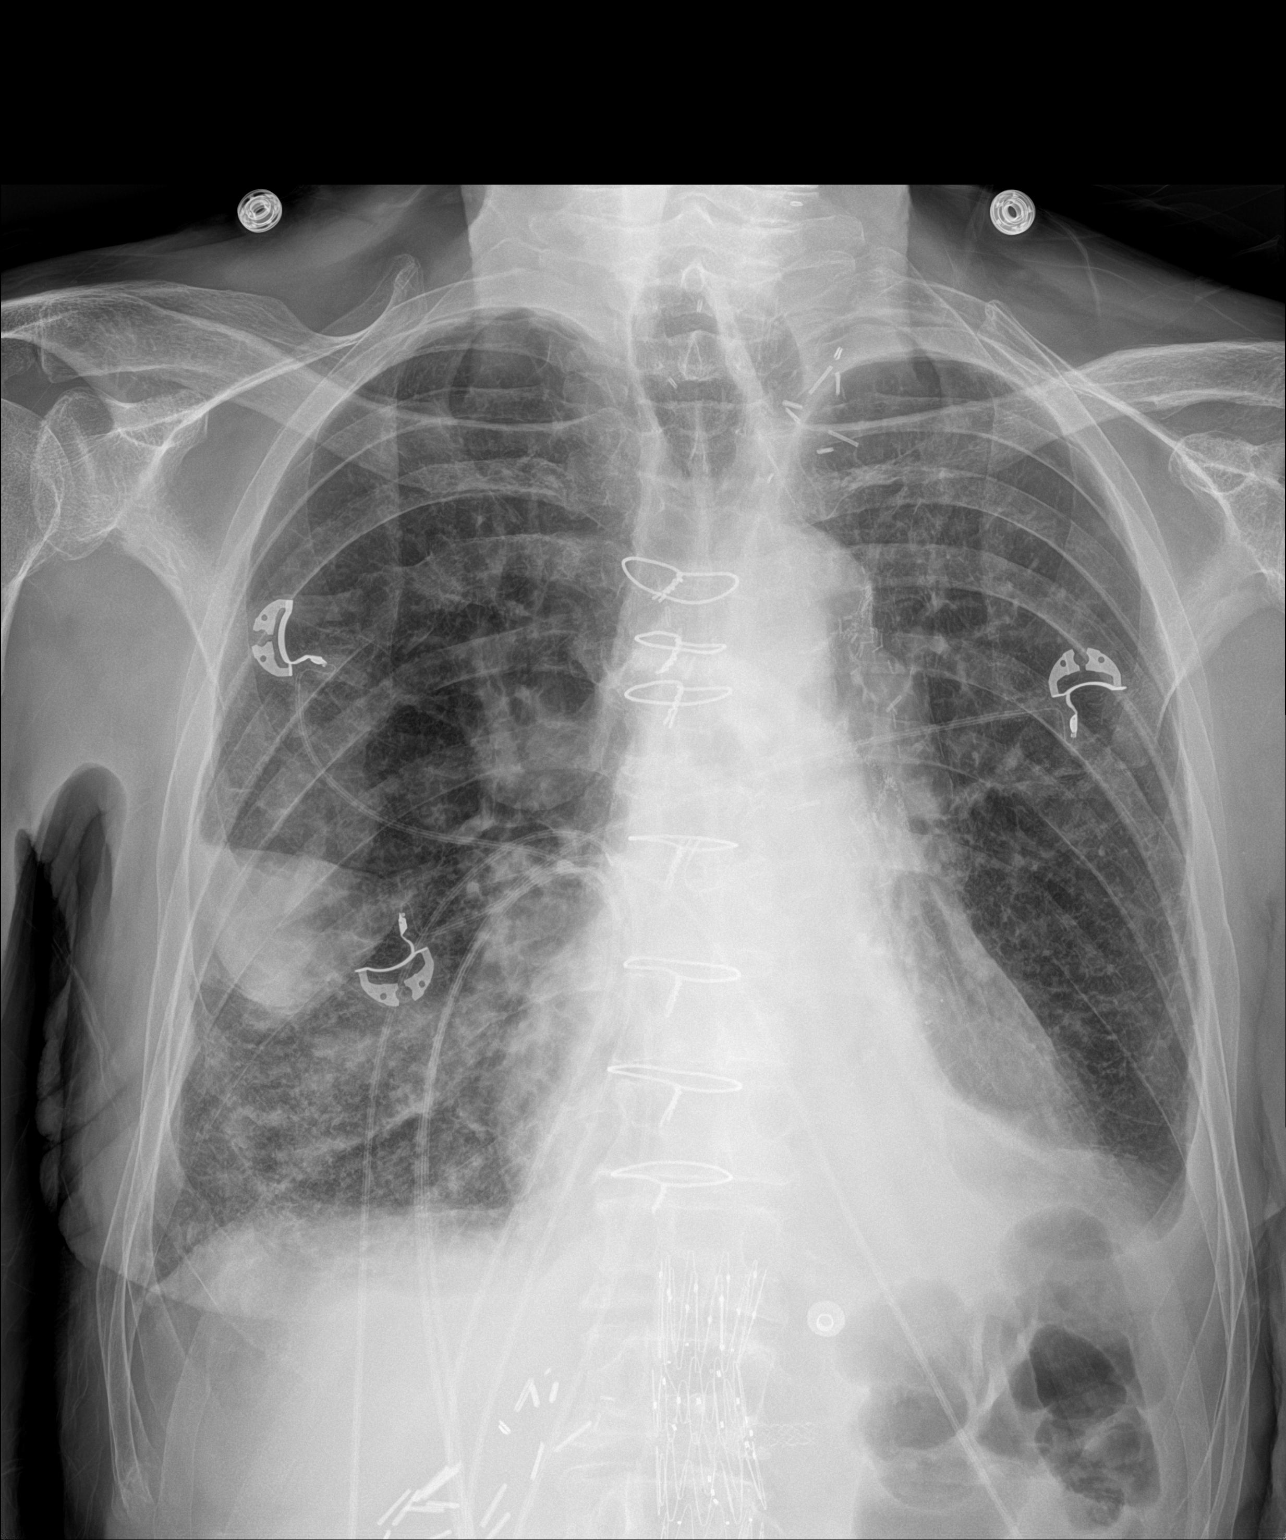

[1 of 1 positions shown; findings below may reference images not displayed]

FINDINGS: There is been slight decrease in RIGHT pleural effusion. No
pneumothorax noted.

Fluid within the RIGHT minor fissure is again noted.

A small LEFT pleural effusion is again identified.

Cardiomegaly and CABG changes noted.
IMPRESSION: Slight decrease in RIGHT pleural effusion.  No pneumothorax.

## 2020-01-30 IMAGING — DX DG CHEST 1V PORT
1 series · 1 of 1 positions shown · non-contrast
Comparison: 10/15/2019.  CT 10/14/2019.

CLINICAL DATA: Shortness of breath.  Chest pain.

EXAM:
PORTABLE CHEST 1 VIEW

[chest ap]
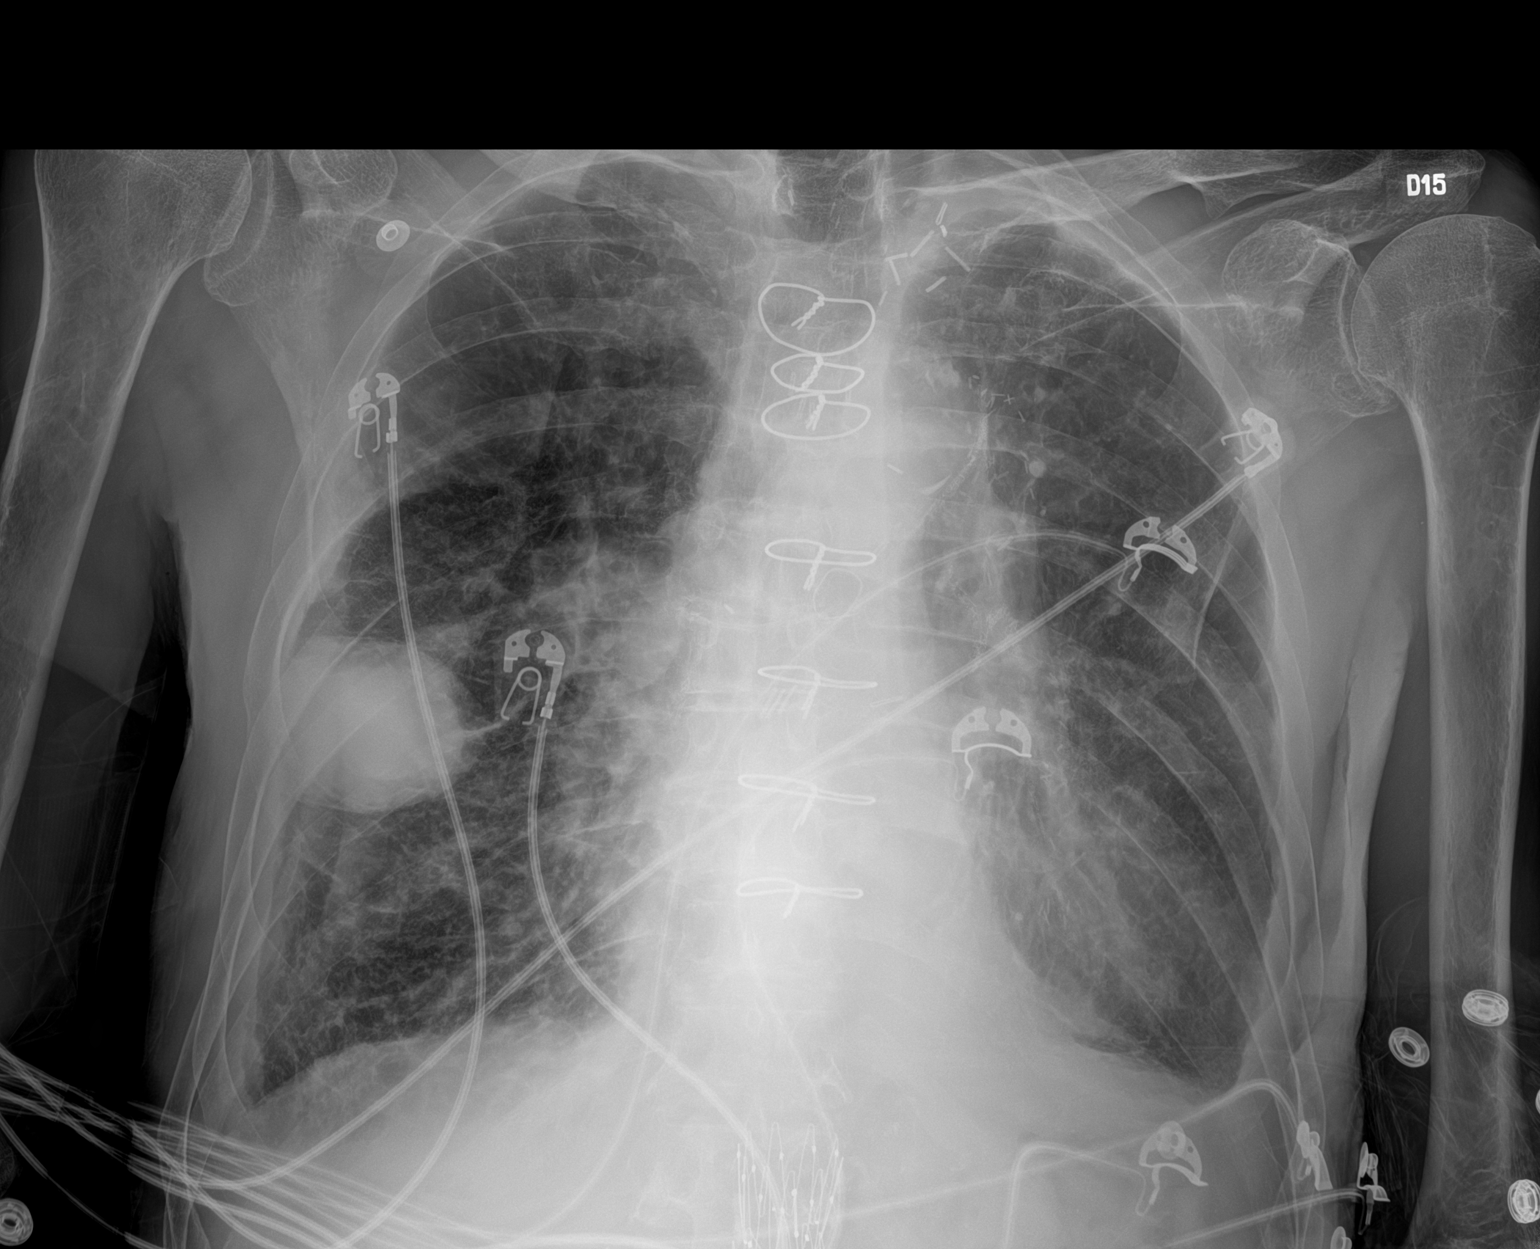

[1 of 1 positions shown; findings below may reference images not displayed]

FINDINGS: Prior CABG. Heart size stable. Left lower lobe atelectatic changes
again noted. Fissural fluid on the right again noted without
significant interim change. Stable small bilateral pleural effusions
again noted. No pneumothorax.
IMPRESSION: 1.  Left lower lobe atelectatic changes again noted.

2. Stable small bilateral pleural effusions. Unchanged fissural
fluid on the right noted.

## 2020-01-30 IMAGING — CT CT ANGIO CHEST
2 of 7 series · 17 of 46 positions shown · IV contrast (APPLIED)
Comparison: 10/14/2019

CLINICAL DATA: Pt arrives POV for acute onset SOB that woke him
from sleep last night at 6226. Pt reports that he had a stent placed
on [REDACTED], and noted acute onset SOB last night. Pt is tachypnic

EXAM:
CT ANGIOGRAPHY CHEST WITH CONTRAST
TECHNIQUE: Multidetector CT imaging of the chest was performed using the
standard protocol during bolus administration of intravenous
contrast. Multiplanar CT image reconstructions and MIPs were
obtained to evaluate the vascular anatomy.
CONTRAST:  100mL OMNIPAQUE IOHEXOL 350 MG/ML SOLN

[Series 9: thins · axial · 0.61mm/px · z∈[+1070,+1349]mm · 14 of 448 slices shown]
[im 25/448  lung]
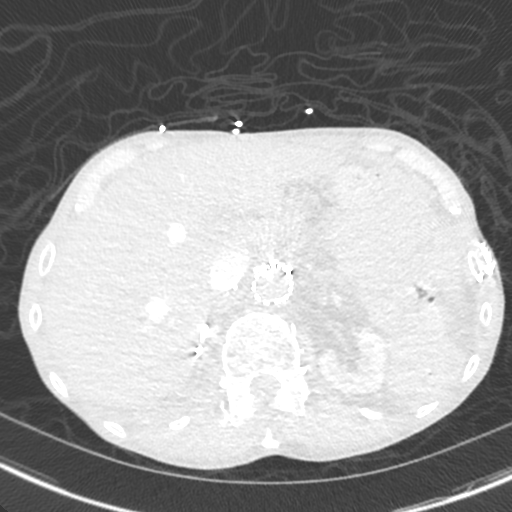
[im 50/448  soft-tissue]
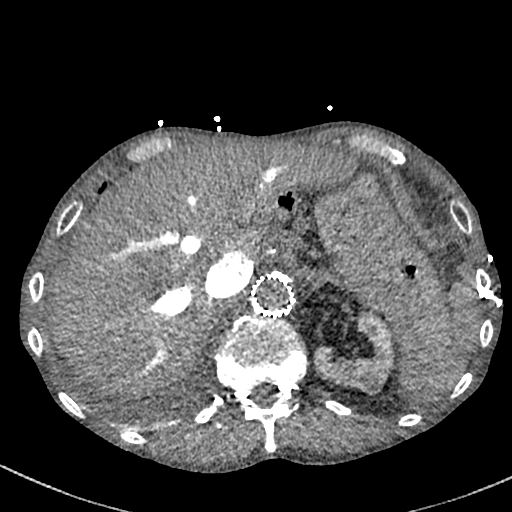
[im 100/448  lung]
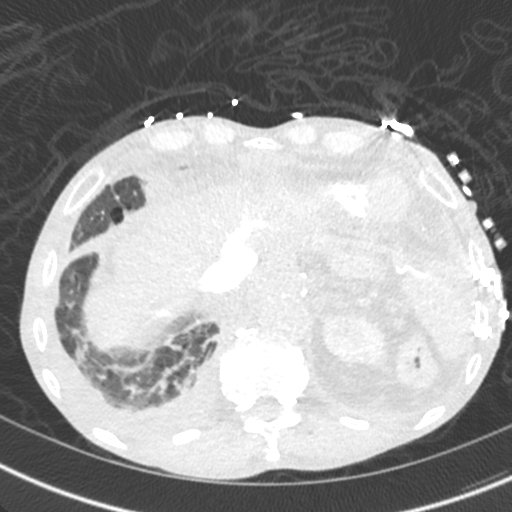
[im 125/448  soft-tissue]
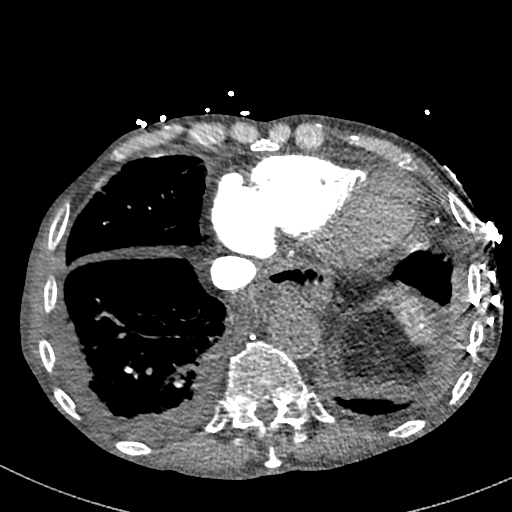
[im 150/448  lung]
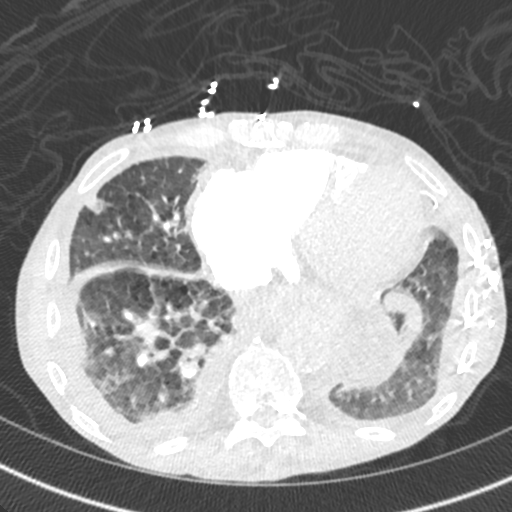
[im 174/448  soft-tissue]
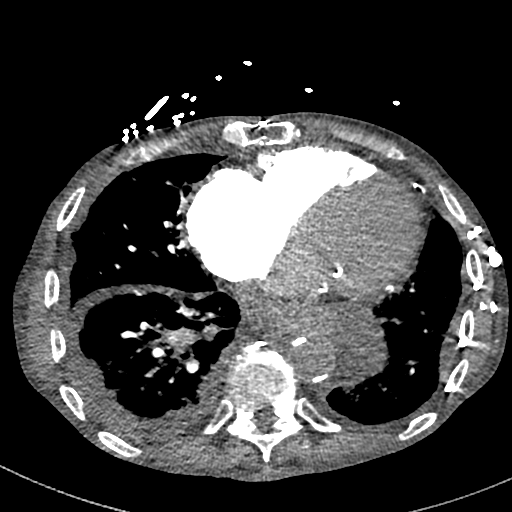
[im 199/448  lung]
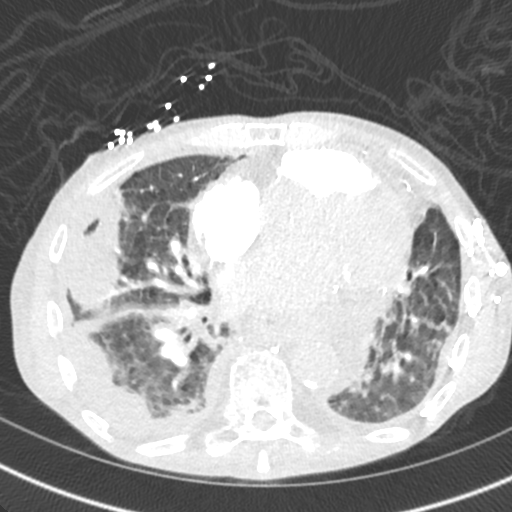
[im 249/448  soft-tissue]
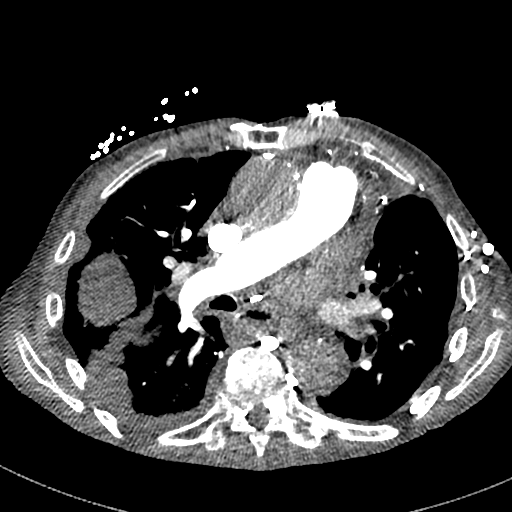
[im 274/448  lung]
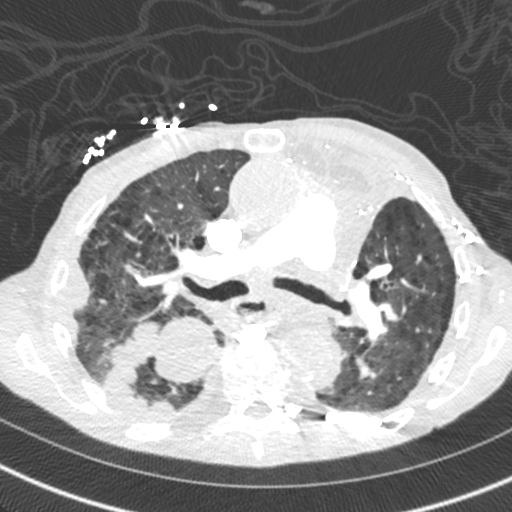
[im 299/448  soft-tissue]
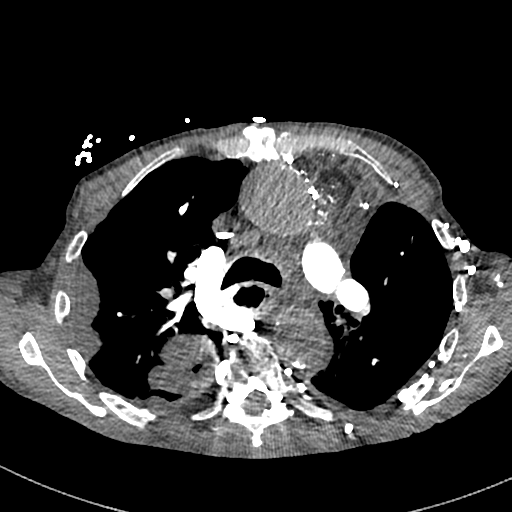
[im 323/448  lung]
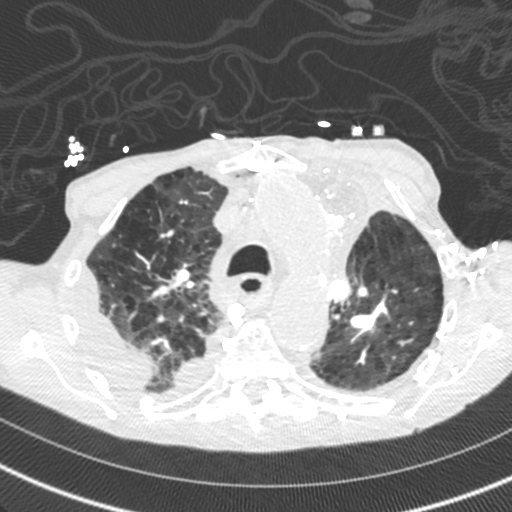
[im 348/448  soft-tissue]
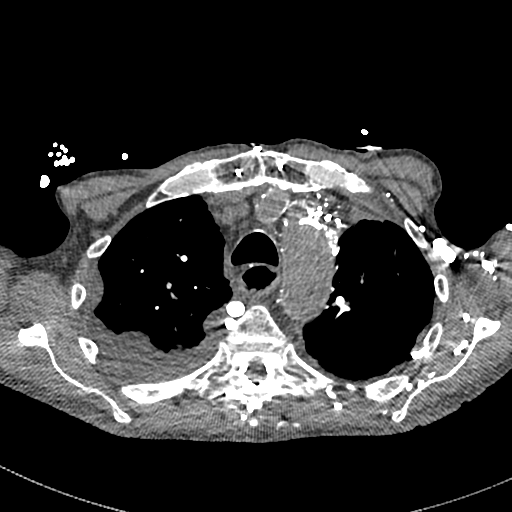
[im 398/448  lung]
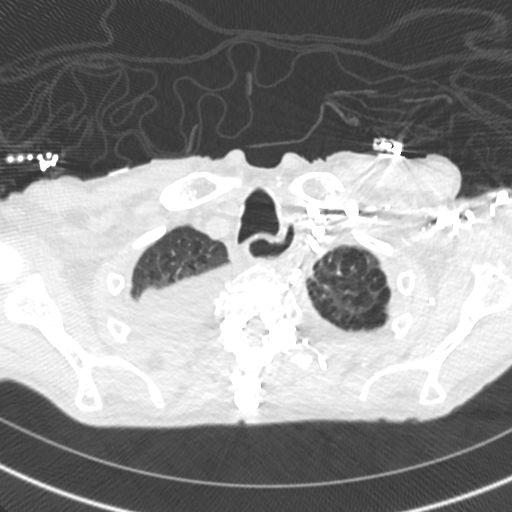
[im 423/448  soft-tissue]
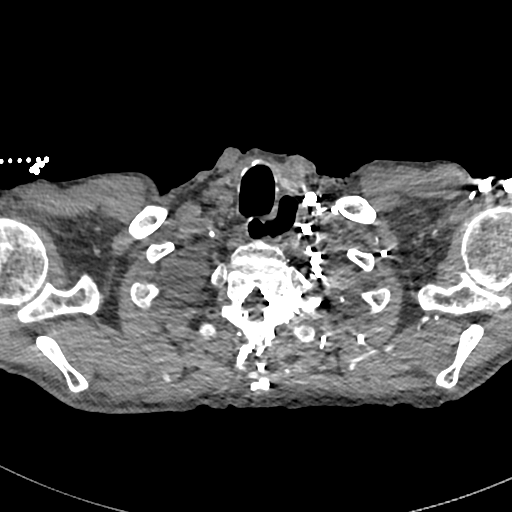

[Series 10: cor · coronal · 0.64mm/px · 3 of 116 slices shown]
[im 29/116  soft-tissue]
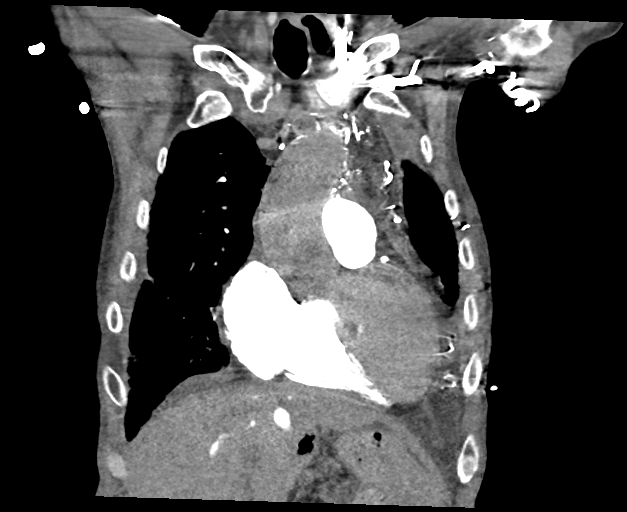
[im 58/116  soft-tissue]
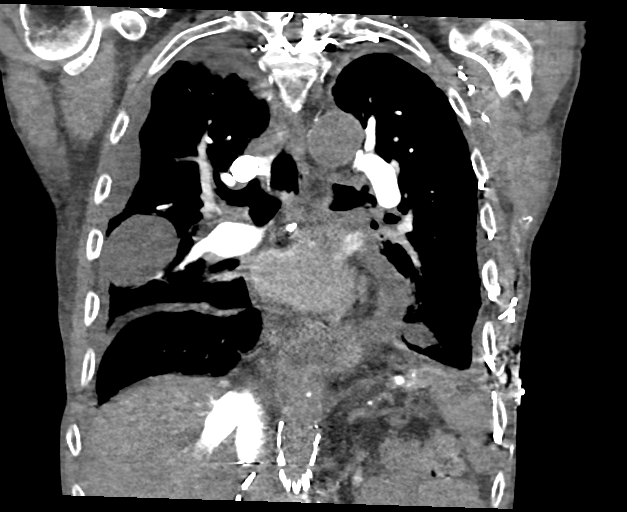
[im 87/116  soft-tissue]
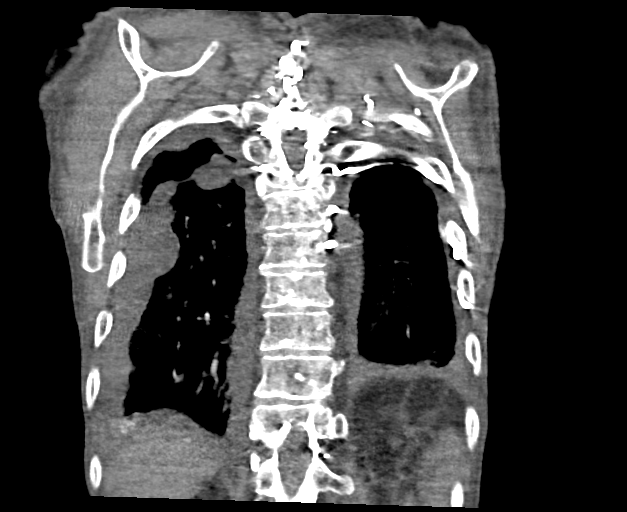

[17 of 46 positions shown; findings below may reference images not displayed]

FINDINGS: Cardiovascular: There is dense opacification of the pulmonary
arteries to the segmental level. There is no aortic opacifications.

There is no convincing pulmonary embolus.

Heart is mildly enlarged. There are changes from prior CABG surgery
and coronary artery calcifications. No pericardial effusion.

Ascending aorta dilated to 4.2 cm.

Mediastinum/Nodes: Prominent shotty lymph nodes along the right
paratracheal mediastinum, largest 12 mm in short axis. No
mediastinal or hilar masses. Trachea is widely patent esophagus is
mildly distended, lung a well-defined distally. Small moderate size
hiatal hernia.

Lungs/Pleura: Small right and trace left pleural effusions. On the
right pleural fluid is loculated along the fissures with a nodular
appearance, which has increased prior exam. Lobulated type posterior
pleural fluid is now noted extending to the right apex.

There is interstitial thickening that is most evident in lower
lungs, particularly the right lower lobe and, to lesser degree,
right middle lobe with significant peribronchial thickening. This
has also increased from prior exam. There are underlying changes of
moderate to advanced centrilobular emphysema, stable.

No pneumothorax.

Upper Abdomen: No acute findings.  Aortic vascular stent.

Musculoskeletal: No fracture or acute finding. No osteoblastic or
osteolytic lesions.

Review of the MIP images confirms the above findings.
IMPRESSION: 1. No evidence of a pulmonary embolism.
2. Loculated right pleural effusion has increased from the prior
study as has right lower middle lobe interstitial thickening.
Interstitial thickening is felt likely to reflect asymmetric
interstitial pulmonary edema congestive heart failure.
3. Contrast extends from the superior vena cava into the azygos arch
and to spinal venous branches, and distends the inferior vena cava
and hepatic venous branches. These findings suggest right heart
failure.
4. Dilated ascending thoracic aorta to 4.2 cm, unchanged from prior
CT allowing for measurement differences. Ascending thoracic aortic
aneurysm. Recommend semi-annual imaging followup by CTA or MRA and
referral to cardiothoracic surgery if not already obtained. This
recommendation follows 3747
ACCF/AHA/AATS/ACR/ASA/SCA/LOCKLEAR/NUN LOURDES/ZUB/SALE Guidelines for the
Diagnosis and Management of Patients With Thoracic Aortic Disease.
Circulation. 3747; 121: E266-e369. Aortic aneurysm NOS (C21IU-DVV.G)
5. Moderate to advanced centrilobular emphysema.
6. Coronary artery and aortic atherosclerotic calcifications.

Aortic Atherosclerosis (C21IU-3AL.L) and Emphysema (C21IU-NXM.N).
